# Patient Record
Sex: Male | Born: 1955 | Race: White | Hispanic: No | Smoking: Never smoker
Health system: Southern US, Community
[De-identification: ages and names within clinical notes are randomized; demographics above are authoritative.]

## PROBLEM LIST (undated history)

## (undated) DIAGNOSIS — F3160 Bipolar disorder, current episode mixed, unspecified: Secondary | ICD-10-CM

## (undated) DIAGNOSIS — G459 Transient cerebral ischemic attack, unspecified: Secondary | ICD-10-CM

## (undated) DIAGNOSIS — K219 Gastro-esophageal reflux disease without esophagitis: Secondary | ICD-10-CM

## (undated) DIAGNOSIS — Z8719 Personal history of other diseases of the digestive system: Secondary | ICD-10-CM

## (undated) DIAGNOSIS — I10 Essential (primary) hypertension: Secondary | ICD-10-CM

## (undated) DIAGNOSIS — E119 Type 2 diabetes mellitus without complications: Secondary | ICD-10-CM

## (undated) DIAGNOSIS — N400 Enlarged prostate without lower urinary tract symptoms: Secondary | ICD-10-CM

## (undated) DIAGNOSIS — E669 Obesity, unspecified: Secondary | ICD-10-CM

## (undated) DIAGNOSIS — IMO0001 Reserved for inherently not codable concepts without codable children: Secondary | ICD-10-CM

## (undated) DIAGNOSIS — R42 Dizziness and giddiness: Secondary | ICD-10-CM

## (undated) DIAGNOSIS — E785 Hyperlipidemia, unspecified: Secondary | ICD-10-CM

## (undated) DIAGNOSIS — I639 Cerebral infarction, unspecified: Secondary | ICD-10-CM

## (undated) DIAGNOSIS — K2970 Gastritis, unspecified, without bleeding: Secondary | ICD-10-CM

## (undated) HISTORY — DX: Obesity, unspecified: E66.9

## (undated) HISTORY — DX: Reserved for inherently not codable concepts without codable children: IMO0001

## (undated) HISTORY — DX: Dizziness and giddiness: R42

## (undated) HISTORY — DX: Benign prostatic hyperplasia without lower urinary tract symptoms: N40.0

## (undated) HISTORY — PX: NO PAST SURGERIES: SHX2092

## (undated) HISTORY — DX: Hyperlipidemia, unspecified: E78.5

## (undated) HISTORY — DX: Gastritis, unspecified, without bleeding: K29.70

## (undated) HISTORY — PX: COLONOSCOPY: SHX5424

## (undated) HISTORY — DX: Gastro-esophageal reflux disease without esophagitis: K21.9

## (undated) HISTORY — PX: ESOPHAGOGASTRODUODENOSCOPY: SHX1529

---

## 1998-05-08 ENCOUNTER — Ambulatory Visit (HOSPITAL_COMMUNITY): Admission: RE | Admit: 1998-05-08 | Discharge: 1998-05-08 | Payer: Self-pay | Admitting: Gastroenterology

## 1998-05-08 ENCOUNTER — Encounter: Payer: Self-pay | Admitting: Gastroenterology

## 1999-08-06 ENCOUNTER — Ambulatory Visit (HOSPITAL_COMMUNITY): Admission: RE | Admit: 1999-08-06 | Discharge: 1999-08-06 | Payer: Self-pay | Admitting: Gastroenterology

## 2001-07-06 ENCOUNTER — Ambulatory Visit (HOSPITAL_COMMUNITY): Admission: RE | Admit: 2001-07-06 | Discharge: 2001-07-06 | Payer: Self-pay | Admitting: Gastroenterology

## 2001-07-27 ENCOUNTER — Encounter: Payer: Self-pay | Admitting: Family Medicine

## 2001-07-27 ENCOUNTER — Inpatient Hospital Stay (HOSPITAL_COMMUNITY): Admission: EM | Admit: 2001-07-27 | Discharge: 2001-07-29 | Payer: Self-pay | Admitting: Family Medicine

## 2001-08-07 ENCOUNTER — Inpatient Hospital Stay (HOSPITAL_COMMUNITY): Admission: AD | Admit: 2001-08-07 | Discharge: 2001-08-10 | Payer: Self-pay | Admitting: Gastroenterology

## 2001-08-07 ENCOUNTER — Encounter (INDEPENDENT_AMBULATORY_CARE_PROVIDER_SITE_OTHER): Payer: Self-pay | Admitting: *Deleted

## 2001-08-08 ENCOUNTER — Encounter: Payer: Self-pay | Admitting: Gastroenterology

## 2001-08-09 ENCOUNTER — Encounter: Payer: Self-pay | Admitting: Gastroenterology

## 2001-09-10 ENCOUNTER — Encounter: Payer: Self-pay | Admitting: Internal Medicine

## 2001-09-10 ENCOUNTER — Inpatient Hospital Stay (HOSPITAL_COMMUNITY): Admission: EM | Admit: 2001-09-10 | Discharge: 2001-09-14 | Payer: Self-pay | Admitting: *Deleted

## 2001-11-18 ENCOUNTER — Encounter: Payer: Self-pay | Admitting: Emergency Medicine

## 2001-11-18 ENCOUNTER — Emergency Department (HOSPITAL_COMMUNITY): Admission: EM | Admit: 2001-11-18 | Discharge: 2001-11-18 | Payer: Self-pay | Admitting: Emergency Medicine

## 2001-11-19 ENCOUNTER — Encounter: Payer: Self-pay | Admitting: Emergency Medicine

## 2001-11-19 ENCOUNTER — Emergency Department (HOSPITAL_COMMUNITY): Admission: EM | Admit: 2001-11-19 | Discharge: 2001-11-19 | Payer: Self-pay | Admitting: Emergency Medicine

## 2001-12-13 ENCOUNTER — Inpatient Hospital Stay (HOSPITAL_COMMUNITY): Admission: EM | Admit: 2001-12-13 | Discharge: 2001-12-16 | Payer: Self-pay | Admitting: Emergency Medicine

## 2001-12-15 ENCOUNTER — Encounter: Payer: Self-pay | Admitting: Emergency Medicine

## 2002-02-03 ENCOUNTER — Emergency Department (HOSPITAL_COMMUNITY): Admission: EM | Admit: 2002-02-03 | Discharge: 2002-02-04 | Payer: Self-pay | Admitting: Emergency Medicine

## 2003-03-27 ENCOUNTER — Encounter: Admission: RE | Admit: 2003-03-27 | Discharge: 2003-03-27 | Payer: Self-pay | Admitting: Family Medicine

## 2007-05-04 HISTORY — PX: CARDIOVASCULAR STRESS TEST: SHX262

## 2007-05-26 ENCOUNTER — Encounter: Admission: RE | Admit: 2007-05-26 | Discharge: 2007-05-26 | Payer: Self-pay | Admitting: Family Medicine

## 2007-09-12 ENCOUNTER — Encounter: Admission: RE | Admit: 2007-09-12 | Discharge: 2007-09-12 | Payer: Self-pay | Admitting: Gastroenterology

## 2007-09-15 ENCOUNTER — Encounter: Payer: Self-pay | Admitting: Cardiology

## 2007-10-18 ENCOUNTER — Encounter: Admission: RE | Admit: 2007-10-18 | Discharge: 2007-10-18 | Payer: Self-pay | Admitting: Gastroenterology

## 2007-11-21 ENCOUNTER — Encounter: Admission: RE | Admit: 2007-11-21 | Discharge: 2007-11-21 | Payer: Self-pay | Admitting: Gastroenterology

## 2008-01-09 ENCOUNTER — Encounter: Payer: Self-pay | Admitting: Cardiology

## 2008-04-22 ENCOUNTER — Encounter: Payer: Self-pay | Admitting: Cardiology

## 2008-05-06 ENCOUNTER — Encounter: Payer: Self-pay | Admitting: Cardiology

## 2008-07-29 ENCOUNTER — Encounter: Payer: Self-pay | Admitting: Cardiology

## 2008-08-05 ENCOUNTER — Encounter: Payer: Self-pay | Admitting: Cardiology

## 2008-10-31 ENCOUNTER — Encounter: Payer: Self-pay | Admitting: Cardiology

## 2008-11-05 ENCOUNTER — Encounter: Payer: Self-pay | Admitting: Cardiology

## 2008-11-11 ENCOUNTER — Encounter: Payer: Self-pay | Admitting: Cardiology

## 2008-12-05 ENCOUNTER — Ambulatory Visit: Payer: Self-pay | Admitting: Cardiology

## 2008-12-05 DIAGNOSIS — E7800 Pure hypercholesterolemia, unspecified: Secondary | ICD-10-CM | POA: Insufficient documentation

## 2008-12-30 ENCOUNTER — Encounter: Payer: Self-pay | Admitting: Internal Medicine

## 2008-12-30 LAB — CONVERTED CEMR LAB
ALT: 43 units/L (ref 0–53)
AST: 36 units/L (ref 0–37)
Albumin: 4 g/dL (ref 3.5–5.2)
Alkaline Phosphatase: 50 units/L (ref 39–117)
Bilirubin, Direct: 0.1 mg/dL (ref 0.0–0.3)
Cholesterol: 253 mg/dL — ABNORMAL HIGH (ref 0–200)
Direct LDL: 157 mg/dL
HDL: 37.5 mg/dL — ABNORMAL LOW (ref >39.00)
Total Bilirubin: 1 mg/dL (ref 0.3–1.2)
Total CHOL/HDL Ratio: 7
Total Protein: 7.6 g/dL (ref 6.0–8.3)
Triglycerides: 407 mg/dL — ABNORMAL HIGH (ref 0.0–149.0)
VLDL: 81.4 mg/dL — ABNORMAL HIGH (ref 0.0–40.0)

## 2009-01-02 ENCOUNTER — Ambulatory Visit: Payer: Self-pay | Admitting: Cardiology

## 2009-02-10 ENCOUNTER — Encounter (INDEPENDENT_AMBULATORY_CARE_PROVIDER_SITE_OTHER): Payer: Self-pay | Admitting: *Deleted

## 2009-03-06 ENCOUNTER — Emergency Department (HOSPITAL_COMMUNITY): Admission: EM | Admit: 2009-03-06 | Discharge: 2009-03-06 | Payer: Self-pay | Admitting: Emergency Medicine

## 2009-03-10 ENCOUNTER — Emergency Department (HOSPITAL_COMMUNITY): Admission: EM | Admit: 2009-03-10 | Discharge: 2009-03-10 | Payer: Self-pay | Admitting: Emergency Medicine

## 2009-03-17 ENCOUNTER — Telehealth: Payer: Self-pay | Admitting: Internal Medicine

## 2009-03-24 ENCOUNTER — Observation Stay (HOSPITAL_COMMUNITY): Admission: EM | Admit: 2009-03-24 | Discharge: 2009-03-25 | Payer: Self-pay | Admitting: Emergency Medicine

## 2009-04-04 ENCOUNTER — Ambulatory Visit: Payer: Self-pay | Admitting: Cardiology

## 2009-04-04 LAB — CONVERTED CEMR LAB
ALT: 41 units/L (ref 0–53)
AST: 40 units/L — ABNORMAL HIGH (ref 0–37)
Albumin: 4 g/dL (ref 3.5–5.2)
Alkaline Phosphatase: 37 units/L — ABNORMAL LOW (ref 39–117)
BUN: 19 mg/dL (ref 6–23)
Bilirubin, Direct: 0 mg/dL (ref 0.0–0.3)
CO2: 29 meq/L (ref 19–32)
Calcium: 8.9 mg/dL (ref 8.4–10.5)
Chloride: 103 meq/L (ref 96–112)
Cholesterol: 255 mg/dL — ABNORMAL HIGH (ref 0–200)
Creatinine, Ser: 1.1 mg/dL (ref 0.4–1.5)
Direct LDL: 194.8 mg/dL
GFR calc non Af Amer: 73.8 mL/min (ref >60)
Glucose, Bld: 112 mg/dL — ABNORMAL HIGH (ref 70–99)
HDL: 37.8 mg/dL — ABNORMAL LOW (ref >39.00)
Potassium: 4 meq/L (ref 3.5–5.1)
Sodium: 142 meq/L (ref 135–145)
Total Bilirubin: 0.9 mg/dL (ref 0.3–1.2)
Total CHOL/HDL Ratio: 7
Total Protein: 6.9 g/dL (ref 6.0–8.3)
Triglycerides: 221 mg/dL — ABNORMAL HIGH (ref 0.0–149.0)
VLDL: 44.2 mg/dL — ABNORMAL HIGH (ref 0.0–40.0)

## 2009-04-10 ENCOUNTER — Ambulatory Visit: Payer: Self-pay | Admitting: Cardiovascular Disease

## 2009-04-14 ENCOUNTER — Telehealth (INDEPENDENT_AMBULATORY_CARE_PROVIDER_SITE_OTHER): Payer: Self-pay | Admitting: *Deleted

## 2009-04-16 ENCOUNTER — Ambulatory Visit (HOSPITAL_COMMUNITY): Admission: RE | Admit: 2009-04-16 | Discharge: 2009-04-16 | Payer: Self-pay | Admitting: Gastroenterology

## 2009-04-22 ENCOUNTER — Emergency Department (HOSPITAL_COMMUNITY): Admission: EM | Admit: 2009-04-22 | Discharge: 2009-04-22 | Payer: Self-pay | Admitting: Emergency Medicine

## 2009-05-03 ENCOUNTER — Emergency Department (HOSPITAL_COMMUNITY): Admission: EM | Admit: 2009-05-03 | Discharge: 2009-05-03 | Payer: Self-pay | Admitting: Family Medicine

## 2009-05-06 ENCOUNTER — Emergency Department (HOSPITAL_COMMUNITY): Admission: EM | Admit: 2009-05-06 | Discharge: 2009-05-06 | Payer: Self-pay | Admitting: Emergency Medicine

## 2009-05-15 ENCOUNTER — Encounter: Admission: RE | Admit: 2009-05-15 | Discharge: 2009-08-13 | Payer: Self-pay | Admitting: Family Medicine

## 2009-05-18 ENCOUNTER — Emergency Department (HOSPITAL_COMMUNITY): Admission: EM | Admit: 2009-05-18 | Discharge: 2009-05-18 | Payer: Self-pay | Admitting: Emergency Medicine

## 2009-06-26 ENCOUNTER — Ambulatory Visit: Payer: Self-pay | Admitting: Cardiology

## 2009-06-30 LAB — CONVERTED CEMR LAB
ALT: 47 units/L (ref 0–53)
AST: 48 units/L — ABNORMAL HIGH (ref 0–37)
Albumin: 4 g/dL (ref 3.5–5.2)
Alkaline Phosphatase: 49 units/L (ref 39–117)
Bilirubin, Direct: 0.1 mg/dL (ref 0.0–0.3)
Cholesterol: 299 mg/dL — ABNORMAL HIGH (ref 0–200)
Direct LDL: 211.3 mg/dL
HDL: 56.4 mg/dL (ref >39.00)
Total Bilirubin: 0.5 mg/dL (ref 0.3–1.2)
Total CHOL/HDL Ratio: 5
Total Protein: 7.1 g/dL (ref 6.0–8.3)
Triglycerides: 312 mg/dL — ABNORMAL HIGH (ref 0.0–149.0)
VLDL: 62.4 mg/dL — ABNORMAL HIGH (ref 0.0–40.0)

## 2009-07-03 ENCOUNTER — Ambulatory Visit: Payer: Self-pay | Admitting: Cardiology

## 2009-07-18 ENCOUNTER — Emergency Department (HOSPITAL_COMMUNITY): Admission: EM | Admit: 2009-07-18 | Discharge: 2009-07-18 | Payer: Self-pay | Admitting: Emergency Medicine

## 2009-08-13 ENCOUNTER — Emergency Department (HOSPITAL_COMMUNITY): Admission: EM | Admit: 2009-08-13 | Discharge: 2009-08-13 | Payer: Self-pay | Admitting: Family Medicine

## 2009-09-01 ENCOUNTER — Encounter: Payer: Self-pay | Admitting: Cardiology

## 2009-09-02 ENCOUNTER — Emergency Department (HOSPITAL_COMMUNITY): Admission: EM | Admit: 2009-09-02 | Discharge: 2009-09-02 | Payer: Self-pay | Admitting: Emergency Medicine

## 2009-10-02 ENCOUNTER — Ambulatory Visit: Payer: Self-pay | Admitting: Internal Medicine

## 2009-10-02 ENCOUNTER — Telehealth (INDEPENDENT_AMBULATORY_CARE_PROVIDER_SITE_OTHER): Payer: Self-pay | Admitting: *Deleted

## 2009-10-03 ENCOUNTER — Telehealth (INDEPENDENT_AMBULATORY_CARE_PROVIDER_SITE_OTHER): Payer: Self-pay | Admitting: *Deleted

## 2009-10-10 ENCOUNTER — Telehealth (INDEPENDENT_AMBULATORY_CARE_PROVIDER_SITE_OTHER): Payer: Self-pay | Admitting: *Deleted

## 2009-10-13 LAB — CONVERTED CEMR LAB
Cholesterol: 295 mg/dL — ABNORMAL HIGH (ref 0–200)
Direct LDL: 200.4 mg/dL
HDL: 49 mg/dL (ref >39.00)
Total CHOL/HDL Ratio: 6
Triglycerides: 360 mg/dL — ABNORMAL HIGH (ref 0.0–149.0)
VLDL: 72 mg/dL — ABNORMAL HIGH (ref 0.0–40.0)

## 2009-12-08 ENCOUNTER — Ambulatory Visit: Payer: Self-pay | Admitting: Cardiology

## 2010-01-06 ENCOUNTER — Ambulatory Visit: Payer: Self-pay | Admitting: Cardiovascular Disease

## 2010-01-09 LAB — CONVERTED CEMR LAB
ALT: 36 units/L (ref 0–53)
AST: 43 units/L — ABNORMAL HIGH (ref 0–37)
Albumin: 4 g/dL (ref 3.5–5.2)
Alkaline Phosphatase: 46 units/L (ref 39–117)
Bilirubin, Direct: 0.1 mg/dL (ref 0.0–0.3)
Cholesterol: 267 mg/dL — ABNORMAL HIGH (ref 0–200)
Direct LDL: 161 mg/dL
HDL: 41.1 mg/dL (ref >39.00)
Total Bilirubin: 0.4 mg/dL (ref 0.3–1.2)
Total CHOL/HDL Ratio: 6
Total Protein: 6.6 g/dL (ref 6.0–8.3)
Triglycerides: 513 mg/dL — ABNORMAL HIGH (ref 0.0–149.0)
VLDL: 102.6 mg/dL — ABNORMAL HIGH (ref 0.0–40.0)

## 2010-01-15 ENCOUNTER — Ambulatory Visit: Payer: Self-pay | Admitting: Cardiology

## 2010-03-03 ENCOUNTER — Encounter: Payer: Self-pay | Admitting: Internal Medicine

## 2010-03-27 ENCOUNTER — Ambulatory Visit: Payer: Self-pay | Admitting: Cardiovascular Disease

## 2010-04-02 ENCOUNTER — Telehealth (INDEPENDENT_AMBULATORY_CARE_PROVIDER_SITE_OTHER): Payer: Self-pay | Admitting: *Deleted

## 2010-04-02 DIAGNOSIS — IMO0001 Reserved for inherently not codable concepts without codable children: Secondary | ICD-10-CM

## 2010-04-02 HISTORY — DX: Reserved for inherently not codable concepts without codable children: IMO0001

## 2010-04-06 ENCOUNTER — Ambulatory Visit: Payer: Self-pay | Admitting: Cardiovascular Disease

## 2010-04-06 ENCOUNTER — Ambulatory Visit: Payer: Self-pay

## 2010-04-06 ENCOUNTER — Encounter (HOSPITAL_COMMUNITY)
Admission: RE | Admit: 2010-04-06 | Discharge: 2010-06-02 | Payer: Self-pay | Source: Home / Self Care | Attending: Cardiology | Admitting: Cardiology

## 2010-04-06 ENCOUNTER — Encounter: Payer: Self-pay | Admitting: Cardiovascular Disease

## 2010-04-16 ENCOUNTER — Ambulatory Visit: Payer: Self-pay | Admitting: Internal Medicine

## 2010-05-31 LAB — CONVERTED CEMR LAB
ALT: 36 units/L (ref 0–53)
AST: 43 units/L — ABNORMAL HIGH (ref 0–37)
Albumin: 3.8 g/dL (ref 3.5–5.2)
Alkaline Phosphatase: 46 units/L (ref 39–117)
Bilirubin, Direct: 0.1 mg/dL (ref 0.0–0.3)
Cholesterol: 247 mg/dL — ABNORMAL HIGH (ref 0–200)
Direct LDL: 155.8 mg/dL
HDL: 41.4 mg/dL (ref >39.00)
Total Bilirubin: 0.6 mg/dL (ref 0.3–1.2)
Total CHOL/HDL Ratio: 6
Total Protein: 6.5 g/dL (ref 6.0–8.3)
Triglycerides: 336 mg/dL — ABNORMAL HIGH (ref 0.0–149.0)
VLDL: 67.2 mg/dL — ABNORMAL HIGH (ref 0.0–40.0)

## 2010-06-04 NOTE — Assessment & Plan Note (Signed)
Summary: ROV,lipid/eac   Robert Walters presents to lipid clinic today for dyslipidemia follow-up.  His current cholesterol medication regimen includes simvastatin 80 mg daily, fenofibrate 160 mg daily, and fish oil 1000 mg capsules - 3 capsules twice daily.  Pts compliance has much improved since last visit and he reports missing doses only rarely.  Pt is otherwise tolerating this regimen and does not complain of adverse events.  Patient is currently on simvastatin but has been prescribed Lipitor 80 mg daily by his PCP.  Lipitor is coming via mail order and patient will switch to this when it arrives.  He was also pleased to report to me that his stress test was clear and revealed no blockages and good oxygen supply to the heart.   Compliance with dietary and lifesyle modifications has improved. Patient has decreased eating eggs to only twice per week.  Patient has not been willing to change from whoel to 2% milk, but says he only drinks milk once per week.  Patient has attempted to improve his lunch choices.  He no longer eats candy bars for lunch, but does continue to have a diet soda and a small bag of chips or fritos.  Patient will continue to work on this.  He is also attempting to limit starchy foods and make better choices.  These are small but significant changes for Robert Walters considering his resistance in the past.    Patient has also improved his exercise regimen and is now walking at the park every day for at least 20 minutes.  He makes 2 laps around the park (4 laps = 1 mile) at a slow-mod pace.  Patient intends to increase to 4 laps per day.     Lipid Management Robert Walters  Robert Walters, PharmD and Robert Walters  Preventive Screening-Counseling & Management  Alcohol-Tobacco     Alcohol drinks/day: 0     Smoking Status: never  Current Medications (verified): 1)  Fenofibrate 160 Mg Tabs (Fenofibrate) .... Take One Tablet By Mouth Daily With A Meal 2)  Atenolol 100 Mg Tabs (Atenolol)  .... Take One Tablet By Mouth Two Times A Day 3)  Fish Oil 1200 Mg Caps (Omega-3 Fatty Acids) .... Take On Cap 3 Cap Bid 4)  Pantoprazole Sodium 40 Mg Tbec (Pantoprazole Sodium) .[name].. 1 Tab Once Daily 5)  Metformin Hcl 1000 Mg Tabs (Metformin Hcl) .... Take One Tab Two Times A Day With Meals 6)  Glipizide 10 Mg Tabs (Glipizide) .... Take One Tab Two Times A Day With Meals 7)  Epitol 200 Mg Tabs (Carbamazepine) .... Take One Tab Two Times A Day 8)  Centrum  Tabs (Multiple Vitamins-Minerals) .... Take On Tab Once Daily 9)  Aspirin 81 Mg Tbec (Aspirin) .... Take One Tab Once Daily 10)  Vitamin D 400 Unit Caps (Cholecalciferol) .... Take On Tab Two Times A Day 11)  Cinnamon 500 Mg Caps (Cinnamon) .... Take Two Tabs Once Daily 12)  Lamotrigine 25 Mg Tabs (Lamotrigine) .[name].. 1 Tab At Bedtime 13)  Meclizine Hcl 25 Mg Tabs (Meclizine Hcl) .[name].. 1 Tab Three Times A Day 14)  Losartan Potassium 100 Mg Tabs (Losartan Potassium) .[name].. 1 Tablet Daily. 15)  Oxybutynin Chloride 5 Mg Xr24h-Tab (Oxybutynin Chloride) .[name].. 1 Tablet Daily. 16)  Tamsulosin Hcl 0.4 Mg Caps (Tamsulosin Hcl) .[name].. 1 Tablet Daily. 17)  Finasteride 5 Mg Tabs (Finasteride) .[name].. 1 Tablet Daily 18)  Buspirone Hcl 10 Mg Tabs (Buspirone Hcl) .[name].. 1 Tablet Twice Daily 19)  Lipitor 80  Mg Tabs (Atorvastatin Calcium) .[name].. 1 Tablet Daily.  Allergies (verified): No Known Drug Allergies   Vital Signs:  Patient profile:   55 year old male Weight:      260 pounds Pulse rate:   76 / minute BP sitting:   140 / 96  Impression & Recommendations:  Problem # 1:  HYPERCHOLESTEROLEMIA-PURE (ICD-272.0) Assessment Improved [name] is much improved since last follow-up.  Lipid values are as follows:  TC 247 (recently 267), TG 336 (recently 513), HDL 41, LDL 155 (recently 425).  TC/HDL ratio remains 6.  Patient has made several small but significant dietary changes with which I am pleased.  He has also began incorporating more exercise and seems  motivated to continue.   Lipitor has been prescribed for this patient and he will begin this once it arrives via mail order. I am pleased with [name] progress at this visit and hope he will remain motivated.    Plan:  Stop Simvastatin and begin Lipitor 80 mg Continue making healthy dietary choices.  (I have given pt a booklet of fast food listings and nutrition labels) Continue exercising as much as possible.  Return to clinic in 3 months for follow-up on new regimen.  The following medications were removed from the medication list:    Simvastatin 80 Mg Tabs (Simvastatin) .[name]... Take one tablet by mouth daily at bedtime His updated medication list for this problem includes:    Fenofibrate 160 Mg Tabs (Fenofibrate) .[name]... Take one tablet by mouth daily with a meal    Lipitor 80 Mg Tabs (Atorvastatin calcium) .[name]... 1 tablet daily.  Patient Instructions: 1)  1)  Continue to improve your dietary choices by comparing food labels for various restaurants.  Watch fat content and carbohydrates!!  You've already made many good changes! 2)  2)  Continue exercising and work up to 4 laps per day. 3)  3)  Begin Lipitor (atorvastatin) as soon as it comes in the mail.  4)  4)  Stop simvastatin when you start Lipitor.   5)  5)  Return to clinic in 3 months. 6)  Lipid Clinic Appt:  Thurs, March 8th @ 10:30 am 7)  Labwork:  Mon, March 5th @ 8:45 am (Fasting)

## 2010-06-04 NOTE — Progress Notes (Signed)
Summary: Lipid Question  Phone Note Call from Patient Call back at Home Phone [phone]   Reason for Call: Talk to Nurse Summary of Call: returning call Initial call taken by: Migdalia Dk,  October 03, 2009 1:26 PM  Follow-up for Phone Call        Pt calling asking about lab results from yesterday.  Informed pt that lipids had worsened slightly since May.  He was instructed to continue with the plan set with Elaina Pattee, PharmD during yesterday's office visit.  Pt agreed to plan Follow-up by: Weston Brass PharmD,  October 03, 2009 3:53 PM

## 2010-06-04 NOTE — Assessment & Plan Note (Signed)
Summary: lipid rov/eac   Primary Provider:  Ursula Beath   History of Present Illness: Mr. Creppel presents today for FU to Lipid Clinic.  He does not have intolerances to cholesterol medications, however he is on simvastatin due to cost.  He also takes fenofibrate and high-dose fish oil.  Patient states he has been compliant with meds since last visit.    He has been given extensive counseling on making appropriate diet changes, and he has made minimal progress over the last 10 months.  He continues to eat [employer] steak biscuit or egg/bacon, sausage, or ham/hashbrown for breakfast.  He states he has cut out the [employer] except "once in a while".  He usually eats PB crackers or chips for lunch.  He will eat cereal with whole milk, ham/cheese sandwich on whole wheat, or pork and beans for supper.  He does not eat fruits and vegetables on a regular basis.  He has cut his popcorn down to 1x week.  He drinks water, diet drinks, and tea (half sweet, half unsweet).  His exercise regimen has improved since last year.  He is walking 2-3x around [employer] 2x week.  Encouraged because his weight is down 4 lbs since March.  He complains of "tickling" cough in AM and evening.  Advised to try loratadine x 1week, and if this did not improve to discuss with PCP (possible AE of lisinopril but unlikely at this time).  Mr. Burnsworth presents to lipid clinic today for dyslipidemia follow-up.  He reports semi-compliance with his medication regimen which includes simvastatin 80 mg daily, fenofibrate 160 mg daily, and fish oil 1000 mg capsules - 3 capsules twice daily.  Pt is tolerating this regimen and does not complain of adverse events.  Recently, this patient has experienced episodes of vertigo, requiring hospitalization and rehabilitation.  He says, this has caused him to be non-compliant for a short time due to severe N/V.  He does not smoke or drink.    Dietary compliance is poor and patient  disregards any previous education we have provided regarding healthy dietary choices.  This patient is a fan of fast food and enjoys a full course breakfast from [employer] every morning (biscuit, eggs, bacon, hashbrown, and coffee).  For lunch, he usually has a sandwich on whole wheat, unsweet tea/diet drink, and maybe cookies for dessert.  For supper pt reports eating out alot, but limiting his fast food to once weekly.  When he is not eating fast food, he enjoys BBQ with all the fixins, he also loves pinto beans and hates chicken and fish.  He loves steak, but limits it to 3x/mo.  He also asked if "fatback" was ok for him to eat...  Between meals the patient enjoys snacking.  He has a full size bag of extra butter popcorn about 3 times per week, and also frequently has a bowl of cereal for a nightly snack.  Beverages include unsweet tea, diet drinks, and pt only drinks whole milk.    Exercise is not routine and includes walking around [employer] about 3x/wk for about 30 minutes.   Lipid Management Provider  Eda Keys, PharmD  Preventive Screening-Counseling & Management  Alcohol-Tobacco     Alcohol drinks/day: 0     Smoking Status: never  Caffeine-Diet-Exercise     Does Patient Exercise: yes     Type of exercise: Walking     Exercise (avg: min/session): <30     Times/week: <3     Exercise Counseling: to  improve exercise regimen  Allergies: No Known Drug Allergies  Social History: Does Patient Exercise:  yes   Vital Signs:  Patient profile:   55 year old male Height:      69 inches Weight:      254 pounds BMI:     37.64  Impression & Recommendations:  Problem # 1:  HYPERCHOLESTEROLEMIA-PURE (ICD-272.0) Assessment Improved Dyslipidemia has minimally improved.  Lipid values are listed and goals are per ATP III/NCEP guidelines as follows:  TC 220 (Goal <200), TG 305 (Goal <150), HDL 43 (Goal >40), Direct LDL   (Goal <70).  LFTs are WNL AST 38/ALT 53.  Continue current meds.   Diet/exercise noncompliance continues.  Counseled at length about risks and need to implement these lifestyle changes.  Fast food limited to 3x weekly, change to skim milk and fat free cheese, and increase fruits/veggies to 1 each daily.  Choose healthier alternatives overall, which include eating plain grits/fruit, yogurt, cereal, pancakes with fruit, and salads.  Increase exercise to 5x week.  Consider changing to atorvastatin when generic or using new patient assistance program if not improved.  FU in 3 months and obtain fasting lipid/liver panel prior to visit. His updated medication list for this problem includes:    Fenofibrate 160 Mg Tabs (Fenofibrate) .[name]... Take one tablet by mouth daily with a meal    Simvastatin 80 Mg Tabs (Simvastatin) .[name]... Take one tablet by mouth daily at bedtime    Fish Oil 1000 Mg Caps .[name]... Take 3 capsules by mouth twice daily  Other Orders: TLB-Lipid Panel (80061-[id number])  Patient Instructions: 1)  Continue weight loss!  Good job with your efforts so far. 2)  Continue Simvastatin 80 mg at bedtime, Fenofibrate 160 mg daily, and Fish Oil 3 capsules twice daily. 3)  Decrease fast food for breakfast to 3 times per week.  Other days eat yogurt, cereal, pancakes with fruit or jelly and no syrup.  Change to skim milk and fat free cheese.  No fried foods. 4)  Increase fruits and vegetables to at least 1 of each per day. 5)  Continue to cut down on snacks. Popcorn or chips once per week. 6)  Increase walking to 5 times per week. 7)  Next appointment 01/08/10 at 10:30 with Lipid Clinic.  Fasting labs on 01/06/10 at 9:00.  Appended Document: lipid rov/eac Repeat Lipid Panel 10/02/09 to obtain direct LDL since pt was fasting.  TC 295, TG 360, HDL 49, direct LDL 200.  Called pt to inform.  Elaina Pattee, PharmD 10/03/09.

## 2010-06-04 NOTE — Progress Notes (Signed)
  pt came in today Signed ROI,I gave him Copies of his Labs  [facility]  October 02, 2009 11:51 AM

## 2010-06-04 NOTE — Assessment & Plan Note (Signed)
Summary: rov/cb   CC:  dyslipidemia follow-up.  [name] presents to lipid clinic today for dyslipidemia follow-up.  His current cholesterol medication regimen includes simvastatin 80 mg daily, fenofibrate 160 mg daily, and fish oil 1000 mg capsules - 3 capsules twice daily.  Pt is semi-compliant with this regimen and reports missing on average 2 doses per week due to N/V and also admits to taking medications at irregular times of the day. Pt is otherwise tolerating this regimen and does not complain of adverse events. Pt was reminded of increased risk of myalgias with high dose of simvastatin but denies any muscle pain or weakness. He does not smoke or drink.    Compliance with previous dietary and lifesyle recommendations is poor and patient is unwilling to recognize the importance of a healthy diet. A typical breakfast for this patient consists of several scrambled eggs, several pieces of bacon or sausage, a biscuit with gravy, hashbrowns, and coffee usually from [employer] or [employer]. Pt eats fast food frequently and rarely cooks breakfast at home. For lunch, he usually has a diet soda and a candybar and will have an Icee on occasion for snack.  For supper, pt reports often eating corn on the cobb with butter, pinto beans, peas, a ham sandwich with light mayonnaise, and 1-2 glasses of whole milk. Mr. Oyama refuses to switch to 2% milk and hates chicken and fish.  Pt also admits to eating large baked potatoes regularly. He loves steak, but limits intake to 2-3x/month.  He often has a bowl of popcorn with lite butter at night before bedtime.  Beverages include sweetened  tea, diet drinks, whole milk, and bottled water.   Exercise is not routine and includes light walking around [employer] about 2-3x/wk for about 20 minutes.   Lipid Management Provider  Eda Keys, PharmD and Harrel Carina  Preventive Screening-Counseling & Management  Alcohol-Tobacco     Alcohol drinks/day: 0     Smoking  Status: never  Current Medications (verified): 1)  Fenofibrate 160 Mg Tabs (Fenofibrate) .... Take One Tablet By Mouth Daily With A Meal 2)  Atenolol 100 Mg Tabs (Atenolol) .... Take One Tablet By Mouth Two Times A Day 3)  Simvastatin 80 Mg Tabs (Simvastatin) .... Take One Tablet By Mouth Daily At Bedtime 4)  Fish Oil 1200 Mg Caps (Omega-3 Fatty Acids) .... Take On Cap 3 Cap Bid 5)  Pantoprazole Sodium 40 Mg Tbec (Pantoprazole Sodium) .[name].. 1 Tab Once Daily 6)  Metformin Hcl 1000 Mg Tabs (Metformin Hcl) .... Take One Tab Two Times A Day With Meals 7)  Glipizide 10 Mg Tabs (Glipizide) .... Take One Tab Two Times A Day With Meals 8)  Epitol 200 Mg Tabs (Carbamazepine) .... Take One Tab Two Times A Day 9)  Alprazolam 0.5 Mg Tabs (Alprazolam) .... Take One Tab As Needed 10)  Centrum  Tabs (Multiple Vitamins-Minerals) .... Take On Tab Once Daily 11)  Aspirin 81 Mg Tbec (Aspirin) .... Take One Tab Once Daily 12)  Vitamin D 400 Unit Caps (Cholecalciferol) .... Take On Tab Two Times A Day 13)  Cinnamon 500 Mg Caps (Cinnamon) .... Take Two Tabs Once Daily 14)  Lamotrigine 25 Mg Tabs (Lamotrigine) .[name].. 1 Tab At Bedtime 15)  Meclizine Hcl 25 Mg Tabs (Meclizine Hcl) .[name].. 1 Tab Three Times A Day 16)  Losartan .[name].. 1 Tablet Daily  Allergies: No Known Drug Allergies  Past History:  Past Medical History: Last updated: 12/05/2008 Diabetes Type 2 G E R D  Hyperlipidemia  Family History: Last updated: 12/05/2008 Father: alive with DM diagnosed 42yr ago and colon Ca 23yr ago Mother: CVA no A-fib  - 7 yr ago  Risk Factors: Alcohol Use: 0 (01/15/2010) Exercise: yes (10/02/2009)   Vital Signs:  Patient profile:   55 year old male Weight:      258 pounds Pulse rate:   78 / minute BP sitting:   132 / 84  Impression & Recommendations:  Problem # 1:  HYPERCHOLESTEROLEMIA-PURE (ICD-272.0) Assessment Deteriorated Mr. Som's hypercholesterolemia is deteriorating with TG significantly elevated  since last visit and decreased HDL, likely due to poor compliance with medications, diet, exercise, and inadequate control of diabetes. However, despite poor compliance with diet and exercise, Pt's TC and LDL have improved slightly but TC, LDL, and TG are still not at goal. Lipid values with goals per NCEP/ATPIII guidelines are as follows: TC 267 (goal<200), HDL 41 (goal >40), LDL 161 (goal<100), TG 513 (goal<150). LFTs are WNL. Diet and exercise compliance is consistently poor and patient remains reluctant to make many changes. Patient was counseled extensively on the importance of making changes in order to live a healthier lifestyle. Discussed the strategy of beginning with small changes and progressing form there. Patient was counseled on limiting serving size of foods high in carbohydrates and fat. The following changes were recommended:  Reduce intake of eggs to 2-3x/week.  Reduce intake of fatty breakfast meats to 2-3x/week.  Instead of a candy bar and soda for lunch, choose a sandwich on whole wheat bread and water.  Mix 1/2 cup of whole milk with 1/2 cup of 2% milk instead of drinking a full glass of whole milk.  Limit serving sizes of beans, corn, and baked potatoes and try to only have 1 of these with each meal instead of all of them.  Increase walking around [employer] to 4-5x/week.   Continue current medications. Obtain lipid panel and re-evaluate compliance with diet, exercise, and medications in 3 months. Consider referring to dietary consult if not improved in 3 months. May consider switching to more potent statin at next visit when Lipitor becomes generic.  His updated medication list for this problem includes:    Fenofibrate 160 Mg Tabs (Fenofibrate) .[name]... Take one tablet by mouth daily with a meal    Simvastatin 80 Mg Tabs (Simvastatin) .[name]... Take one tablet by mouth daily at bedtime    Fish oil 1000 mg capsules.Marland KitchenMarland KitchenMarland KitchenTake 3 capsules by mouth twice daily

## 2010-06-04 NOTE — Progress Notes (Signed)
Summary: PT WANT COPY OF LIPID RESULTS FROM 09/30/09  Phone Note Call from Patient Call back at Home Phone [phone]   Caller: Patient Summary of Call: Pt want copy of his lab work Initial call taken by: Judie Grieve,  October 10, 2009 11:34 AM  Follow-up for Phone Call        Spoke with pt.  Will mail a copy of most recent lipid panel to his home address on file.  Follow-up by: Weston Brass PharmD,  October 10, 2009 2:46 PM

## 2010-06-04 NOTE — Assessment & Plan Note (Signed)
Summary: Cardiology Nuclear Testing  Nuclear Med Background Indications for Stress Test: Evaluation for Ischemia, Abnormal EKG   History: Myocardial Perfusion Study  History Comments: '09 [id number]  Symptoms: DOE, Fatigue    Nuclear Pre-Procedure Cardiac Risk Factors: Hypertension, Lipids, NIDDM, Obesity Caffeine/Decaff Intake: none NPO After: 11:00 PM Lungs: Clear.  O2 Sat 96% on RA. IV 0.9% NS with Angio Cath: 22g     IV Site: R Forearm IV Started by: Cathlyn Parsons, RN Chest Size (in) 44     Height (in): 69 Weight (lb): 256 BMI: 37.94 Tech Comments: Atenolol held x 12 hours.  Nuclear Med Study 1 or 2 day study:  1 day     Stress Test Type:  Treadmill/Lexiscan Reading MD:  Charlton Haws, MD     Referring MD:  Roger Shelter, MD Resting Radionuclide:  Technetium 58m Tetrofosmin     Resting Radionuclide Dose:  10.8 mCi  Stress Radionuclide:  Technetium 27m Tetrofosmin     Stress Radionuclide Dose:  33 mCi   Stress Protocol Exercise Time (min):  1:24 min     Max HR:  109 bpm     Predicted Max HR:  164 bpm  Max Systolic BP: 175 mm Hg     Percent Max HR:  66.46 %Rate Pressure Product:  02725  Lexiscan: 0.4 mg   Stress Test Technologist:  Rea College, CMA-N     Nuclear Technologist:  Domenic Polite, CNMT  Rest Procedure  Myocardial perfusion imaging was performed at rest 45 minutes following the intravenous administration of Technetium 62m Tetrofosmin.  Stress Procedure  The patient received IV Lexiscan 0.4 mg over 15-seconds with concurrent low level exercise and then Technetium 22m Tetrofosmin was injected at 30-seconds.  There were no significant EKG changes with infusion, but the patient's O2 Sat dropped with low level exercise and infusion from 96% to 83%.   Quantitative spect images were obtained after a 45 minute delay.  QPS Raw Data Images:  Normal; no motion artifact; normal heart/lung ratio. Stress Images:  Normal homogeneous uptake in all areas of the  myocardium. Rest Images:  Normal homogeneous uptake in all areas of the myocardium. Subtraction (SDS):  Normal Transient Ischemic Dilatation:  0.88  (Normal <1.22)  Lung/Heart Ratio:  0.43  (Normal <0.45)  Quantitative Gated Spect Images QGS EDV:  109 ml QGS ESV:  32 ml QGS EF:  70 % QGS cine images:  normal  Findings Normal nuclear study      Overall Impression  Exercise Capacity: Lexiscan with low level exercise BP Response: Normal blood pressure response. Clinical Symptoms: Dizzy ECG Impression: No significant ST segment change suggestive of ischemia. Overall Impression: Normal stress nuclear study.  Appended Document: Cardiology Nuclear Testing copy sent to DR. Deborah Chalk

## 2010-06-04 NOTE — Progress Notes (Signed)
Summary: Nuc Pre-Procedure  Phone Note Outgoing Call Call back at [facility] Phone [phone]   Call placed by: Antionette Char RN,  April 02, 2010 4:10 PM Call placed to: Patient Reason for Call: Confirm/change Appt Summary of Call: Reviewed information on Myoview Information Sheet (see scanned document for further details).  Spoke with patient.     Nuclear Med Background Indications for Stress Test: Evaluation for Ischemia, Abnormal EKG   History: Myocardial Perfusion Study  History Comments: 2009 MPS Nml     Nuclear Pre-Procedure Cardiac Risk Factors: Hypertension, Lipids Height (in): 69

## 2010-06-04 NOTE — Assessment & Plan Note (Signed)
Summary: rov - lipid - [facility]   Primary Provider:  Ursula Beath  CC:  dyslipidemia follow-up.  History of Present Illness:  Lipid Clinic Visit      The patient comes in today for dyslipidemia follow-up.  The patient denies medication problems.  Dietary compliance review reveals an overall grade of F, not eating 5 or more fruits and vegetables, and not limiting fats and TFA's.  Review of exercise habits reveals that the patient is not engaging in routine exercise but does report walking around [employer] 3 times per week.  Adjunctive measures being instituted include omega-3 supplements.    Robert Walters presents to lipid clinic today for dyslipidemia follow-up.  He reports semi-compliance with his medication regimen which includes simvastatin 80 mg daily, fenofibrate 160 mg daily, and fish oil 1000 mg capsules - 3 capsules twice daily.  Pt is tolerating this regimen and does not complain of adverse events.  Recently, this patient has experienced episodes of vertigo, requiring hospitalization and rehabilitation.  He says, this has caused him to be non-compliant for a short time due to severe N/V.  He does not smoke or drink.    Dietary compliance is poor and patient disregards any previous education we have provided regarding healthy dietary choices.  This patient is a fan of fast food and enjoys a full course breakfast from [employer] every morning (biscuit, eggs, bacon, hashbrown, and coffee).  For lunch, he usually has a sandwich on whole wheat, unsweet tea/diet drink, and maybe cookies for dessert.  For supper pt reports eating out alot, but limiting his fast food to once weekly.  When he is not eating fast food, he enjoys BBQ with all the fixins, he also loves pinto beans and hates chicken and fish.  He loves steak, but limits it to 3x/mo.  He also asked if "fatback" was ok for him to eat...  Between meals the patient enjoys snacking.  He has a full size bag of extra butter popcorn about 3 times per  week, and also frequently has a bowl of cereal for a nightly snack.  Beverages include unsweet tea, diet drinks, and pt only drinks whole milk.    Exercise is not routine and includes walking around [employer] about 3x/wk for about 30 minutes.   Lipid Management Provider  Eda Keys, PharmD  Preventive Screening-Counseling & Management  Alcohol-Tobacco     Alcohol drinks/day: 0     Smoking Status: never  Allergies: No Known Drug Allergies  Social History: Smoking Status:  never   Vital Signs:  Patient profile:   55 year old male Weight:      258 pounds BP sitting:   138 / 82  Impression & Recommendations:  Problem # 1:  HYPERCHOLESTEROLEMIA-PURE (ICD-272.0) Assessment Deteriorated Lipid values are as follows:  TC 299 (previously 255, goal <200), TG 312 (previously 221, goal <150), HDL 56 (previously 37, goal >40), LDL 211 (previously 194, goal <70).  LFTs within normal range with AST only slightly elevated.  Patient's LDL and TG have deteriorated since last visit with HDL being the only value at goal.  Patient is already taking high dose statin, as well as fenofibrate plus high dose fish oil.  His worsening lipid levels can likely be contributed to his very poor and noncompliant diet.  We will target lifestyle changes in this patient before changing therapy.  Although, in converstation with this patient it is highly unlikely that he will comply with dietary changes.   Plan:  Continue current  medications.  Improve dietary choices, limiting fast food breakfast to once weekly, limiting snacks especially popcorn to once weekly, and when selecting popcorn choosing the "smart pop", low butter/salt.  I have also encouraged patient to choose lowfat dairy products, increase servings of veg/fruit in diet as well as fiber.  Also, have encouraged patient to walk more, as much as he can.  Follow-up in 3 months.    His updated medication list for this problem includes:    Fenofibrate 160 Mg  Tabs (Fenofibrate) .[name]... Take one tablet by mouth daily with a meal    Simvastatin 80 Mg Tabs (Simvastatin) .[name]... Take one tablet by mouth daily at bedtime

## 2010-06-25 ENCOUNTER — Emergency Department (HOSPITAL_COMMUNITY)
Admission: EM | Admit: 2010-06-25 | Discharge: 2010-06-25 | Disposition: A | Discharge status: Home / Self Care | Payer: Medicare Other | Attending: Emergency Medicine | Admitting: Emergency Medicine

## 2010-06-25 ENCOUNTER — Emergency Department (HOSPITAL_COMMUNITY): Payer: Medicare Other

## 2010-06-25 ENCOUNTER — Encounter (HOSPITAL_COMMUNITY): Payer: Self-pay | Admitting: Radiology

## 2010-06-25 DIAGNOSIS — R109 Unspecified abdominal pain: Secondary | ICD-10-CM | POA: Insufficient documentation

## 2010-06-25 DIAGNOSIS — I10 Essential (primary) hypertension: Secondary | ICD-10-CM | POA: Insufficient documentation

## 2010-06-25 DIAGNOSIS — Z7982 Long term (current) use of aspirin: Secondary | ICD-10-CM | POA: Insufficient documentation

## 2010-06-25 DIAGNOSIS — R110 Nausea: Secondary | ICD-10-CM | POA: Insufficient documentation

## 2010-06-25 DIAGNOSIS — E785 Hyperlipidemia, unspecified: Secondary | ICD-10-CM | POA: Insufficient documentation

## 2010-06-25 DIAGNOSIS — E119 Type 2 diabetes mellitus without complications: Secondary | ICD-10-CM | POA: Insufficient documentation

## 2010-06-25 DIAGNOSIS — Z79899 Other long term (current) drug therapy: Secondary | ICD-10-CM | POA: Insufficient documentation

## 2010-06-25 HISTORY — DX: Essential (primary) hypertension: I10

## 2010-06-25 LAB — URINALYSIS, ROUTINE W REFLEX MICROSCOPIC
Hgb urine dipstick: NEGATIVE
Ketones, ur: NEGATIVE mg/dL
Protein, ur: NEGATIVE mg/dL
Urine Glucose, Fasting: NEGATIVE mg/dL
Urobilinogen, UA: 0.2 mg/dL (ref 0.0–1.0)

## 2010-06-25 LAB — COMPREHENSIVE METABOLIC PANEL
ALT: 39 U/L (ref 0–53)
AST: 43 U/L — ABNORMAL HIGH (ref 0–37)
Albumin: 4.1 g/dL (ref 3.5–5.2)
CO2: 25 mEq/L (ref 19–32)
Chloride: 104 mEq/L (ref 96–112)
GFR calc Af Amer: >60 mL/min (ref >60)
GFR calc non Af Amer: 54 mL/min — ABNORMAL LOW (ref >60)
Potassium: 4.3 mEq/L (ref 3.5–5.1)
Sodium: 139 mEq/L (ref 135–145)
Total Bilirubin: 0.7 mg/dL (ref 0.3–1.2)

## 2010-06-25 LAB — DIFFERENTIAL
Basophils Absolute: 0 10*3/uL (ref 0.0–0.1)
Basophils Relative: 0 % (ref 0–1)
Eosinophils Relative: 3 % (ref 0–5)
Monocytes Absolute: 0.7 10*3/uL (ref 0.1–1.0)
Monocytes Relative: 8 % (ref 3–12)
Neutro Abs: 6.9 10*3/uL (ref 1.7–7.7)

## 2010-06-25 LAB — LIPASE, BLOOD: Lipase: 39 U/L (ref 11–59)

## 2010-06-25 LAB — CBC
HCT: 37.6 % — ABNORMAL LOW (ref 39.0–52.0)
Hemoglobin: 12.7 g/dL — ABNORMAL LOW (ref 13.0–17.0)
MCH: 31 pg (ref 26.0–34.0)
MCHC: 33.8 g/dL (ref 30.0–36.0)
RDW: 13.9 % (ref 11.5–15.5)

## 2010-06-25 MED ORDER — IOHEXOL 300 MG/ML  SOLN
100.0000 mL | Freq: Once | INTRAMUSCULAR | Status: AC | PRN
  Administered 2010-06-25: 100 mL via INTRAVENOUS

## 2010-07-06 ENCOUNTER — Other Ambulatory Visit: Payer: Self-pay

## 2010-07-06 ENCOUNTER — Encounter (INDEPENDENT_AMBULATORY_CARE_PROVIDER_SITE_OTHER): Payer: Self-pay | Admitting: Pharmacist

## 2010-07-06 ENCOUNTER — Other Ambulatory Visit (INDEPENDENT_AMBULATORY_CARE_PROVIDER_SITE_OTHER): Payer: Medicare Other

## 2010-07-06 DIAGNOSIS — E7800 Pure hypercholesterolemia, unspecified: Secondary | ICD-10-CM

## 2010-07-06 DIAGNOSIS — E785 Hyperlipidemia, unspecified: Secondary | ICD-10-CM

## 2010-07-06 LAB — LIPID PANEL
Total CHOL/HDL Ratio: 5
VLDL: 61.8 mg/dL — ABNORMAL HIGH (ref 0.0–40.0)

## 2010-07-06 LAB — HEPATIC FUNCTION PANEL
AST: 43 U/L — ABNORMAL HIGH (ref 0–37)
Alkaline Phosphatase: 54 U/L (ref 39–117)
Bilirubin, Direct: 0.2 mg/dL (ref 0.0–0.3)

## 2010-07-09 ENCOUNTER — Encounter: Payer: Self-pay | Admitting: Internal Medicine

## 2010-07-09 ENCOUNTER — Ambulatory Visit (INDEPENDENT_AMBULATORY_CARE_PROVIDER_SITE_OTHER): Payer: Medicare Other

## 2010-07-09 DIAGNOSIS — Z79899 Other long term (current) drug therapy: Secondary | ICD-10-CM

## 2010-07-09 DIAGNOSIS — E7800 Pure hypercholesterolemia, unspecified: Secondary | ICD-10-CM

## 2010-07-19 LAB — URINE CULTURE
Colony Count: NO GROWTH
Culture: NO GROWTH
Culture: NO GROWTH

## 2010-07-19 LAB — COMPREHENSIVE METABOLIC PANEL
ALT: 41 U/L (ref 0–53)
AST: 46 U/L — ABNORMAL HIGH (ref 0–37)
Alkaline Phosphatase: 50 U/L (ref 39–117)
CO2: 25 mEq/L (ref 19–32)
Calcium: 9.1 mg/dL (ref 8.4–10.5)
Chloride: 103 mEq/L (ref 96–112)
GFR calc Af Amer: >60 mL/min (ref >60)
GFR calc non Af Amer: >60 mL/min (ref >60)
Potassium: 4.7 mEq/L (ref 3.5–5.1)
Sodium: 137 mEq/L (ref 135–145)

## 2010-07-19 LAB — POCT URINALYSIS DIP (DEVICE)
Bilirubin Urine: NEGATIVE
Glucose, UA: 100 mg/dL — AB
Nitrite: NEGATIVE
Urobilinogen, UA: 0.2 mg/dL (ref 0.0–1.0)

## 2010-07-19 LAB — URINALYSIS, ROUTINE W REFLEX MICROSCOPIC
Glucose, UA: NEGATIVE mg/dL
Hgb urine dipstick: NEGATIVE
Ketones, ur: NEGATIVE mg/dL
Protein, ur: NEGATIVE mg/dL

## 2010-07-19 LAB — DIFFERENTIAL
Eosinophils Absolute: 0.2 10*3/uL (ref 0.0–0.7)
Eosinophils Relative: 3 % (ref 0–5)
Lymphs Abs: 1.3 10*3/uL (ref 0.7–4.0)
Monocytes Relative: 6 % (ref 3–12)

## 2010-07-19 LAB — CBC
MCHC: 34.6 g/dL (ref 30.0–36.0)
RBC: 3.73 MIL/uL — ABNORMAL LOW (ref 4.22–5.81)
WBC: 7.5 10*3/uL (ref 4.0–10.5)

## 2010-07-21 LAB — CBC
HCT: 33 % — ABNORMAL LOW (ref 39.0–52.0)
Hemoglobin: 11.8 g/dL — ABNORMAL LOW (ref 13.0–17.0)
MCHC: 35.7 g/dL (ref 30.0–36.0)
MCV: 91.6 fL (ref 78.0–100.0)
Platelets: 150 K/uL (ref 150–400)
RBC: 3.6 MIL/uL — ABNORMAL LOW (ref 4.22–5.81)
RDW: 14 % (ref 11.5–15.5)
WBC: 6.1 K/uL (ref 4.0–10.5)

## 2010-07-21 LAB — DIFFERENTIAL
Basophils Absolute: 0 K/uL (ref 0.0–0.1)
Basophils Relative: 0 % (ref 0–1)
Eosinophils Absolute: 0.2 K/uL (ref 0.0–0.7)
Eosinophils Relative: 4 % (ref 0–5)
Lymphocytes Relative: 19 % (ref 12–46)
Lymphs Abs: 1.1 K/uL (ref 0.7–4.0)
Monocytes Absolute: 0.4 K/uL (ref 0.1–1.0)
Monocytes Relative: 6 % (ref 3–12)
Neutro Abs: 4.3 K/uL (ref 1.7–7.7)
Neutrophils Relative %: 71 % (ref 43–77)

## 2010-07-21 LAB — POCT CARDIAC MARKERS
CKMB, poc: 1.2 ng/mL (ref 1.0–8.0)
Myoglobin, poc: 73.9 ng/mL (ref 12–200)
Troponin i, poc: <0.05 ng/mL (ref 0.00–0.09)

## 2010-07-21 LAB — POCT I-STAT, CHEM 8
BUN: 23 mg/dL (ref 6–23)
Calcium, Ion: 1.12 mmol/L (ref 1.12–1.32)
Chloride: 105 mEq/L (ref 96–112)
Glucose, Bld: 139 mg/dL — ABNORMAL HIGH (ref 70–99)

## 2010-07-21 NOTE — Assessment & Plan Note (Signed)
Summary: LIPID ROV/EAC/TT   Primary Provider:  Ursula Beath   History of Present Illness: [name] presents to lipid clinic today for dyslipidemia follow-up.  His current cholesterol medication regimen includes crestor (he could not remember the dose), fenofibrate 160 mg daily, and fish oil 1000 mg capsules - 3 capsules twice daily.  Pts compliance seems to be very consistent with little to no missed doses.  Pt is otherwise tolerating this regimen and does not complain of adverse events. Recent change in simvastatin to crestor made by md should help his lipid profile.  Compliance with dietary and lifesyle modifications has improved. Patient currently eats eggs daily with toast, 2 slices of bacon, and a biscuit most days of the week (from [employer]).  Patient is still not willing to change from whole to 2% milk, but convinced him to try more water throughout his day. Patient continues to work on diet.    Patient has also improved his exercise regimen and is now walking at the park every day for at least 20 minutes.  He makes 3 laps around the park (4 laps = 1 mile) at a slow-mod pace.  Patient intends to increase to 4 laps per day.    Lipid Management Provider: Samantha Crimes, PharmD  Preventive Screening-Counseling & Management  Alcohol-Tobacco     Alcohol drinks/day: 0     Smoking Status: never  Current Medications (verified): 1)  Fenofibrate 160 Mg Tabs (Fenofibrate) .... Take One Tablet By Mouth Daily With A Meal 2)  Atenolol 100 Mg Tabs (Atenolol) .... Take One Tablet By Mouth Two Times A Day 3)  Fish Oil 1200 Mg Caps (Omega-3 Fatty Acids) .... Take On Cap 3 Cap Bid 4)  Pantoprazole Sodium 40 Mg Tbec (Pantoprazole Sodium) .[name].. 1 Tab Once Daily 5)  Metformin Hcl 1000 Mg Tabs (Metformin Hcl) .... Take One Tab Two Times A Day With Meals 6)  Glipizide 10 Mg Tabs (Glipizide) .... Take One Tab Two Times A Day With Meals 7)  Epitol 200 Mg Tabs (Carbamazepine) .... Take One Tab Two Times  A Day 8)  Centrum  Tabs (Multiple Vitamins-Minerals) .... Take On Tab Once Daily 9)  Aspirin 81 Mg Tbec (Aspirin) .... Take One Tab Once Daily 10)  Vitamin D 400 Unit Caps (Cholecalciferol) .... Take On Tab Two Times A Day 11)  Cinnamon 500 Mg Caps (Cinnamon) .... Take Two Tabs Once Daily 12)  Lamotrigine 25 Mg Tabs (Lamotrigine) .[name].. 1 Tab At Bedtime 13)  Meclizine Hcl 25 Mg Tabs (Meclizine Hcl) .[name].. 1 Tab Three Times A Day 14)  Losartan Potassium 100 Mg Tabs (Losartan Potassium) .[name].. 1 Tablet Daily. 15)  Oxybutynin Chloride 5 Mg Xr24h-Tab (Oxybutynin Chloride) .[name].. 1 Tablet Daily. 16)  Tamsulosin Hcl 0.4 Mg Caps (Tamsulosin Hcl) .[name].. 1 Tablet Daily. 17)  Finasteride 5 Mg Tabs (Finasteride) .[name].. 1 Tablet Daily 18)  Buspirone Hcl 10 Mg Tabs (Buspirone Hcl) .[name].. 1 Tablet Twice Daily 19)  Lipitor 80 Mg Tabs (Atorvastatin Calcium) .[name].. 1 Tablet Daily.  Allergies (verified): No Known Drug Allergies  Vital Signs:  Patient profile:   55 year old male Height:      69 inches Weight:      260 pounds BMI:     38.53 Pulse rate:   80 / minute BP sitting:   134 / 82  (right arm)   Impression & Recommendations:  Problem # 1:  PURE HYPERCHOLESTEROLEMIA (ICD-272.0) Total cholest decreased from 247 to 212 and TG have come down  from 336 to 309. LDL is also decreasing from 155 to 137.   The big challenge for [name] is diet and excercise. I spoke with him at length about his diet and a plan for change. We agreed upon a plan to decrease meals from twice a day bolus feeding with late night snack (2 sandwhiches and chips) to several small meals throughout the day. I explained why this setup would work better not only for his cholesterol but also for his diabetes. I also encouraged him to stay away from eating food 3-4 hours before he goes to bed at night. It doesnt seem as though he drinks a lot throughout the day so encouraged him to drink more and to make it water not sundrop or sweet tea. I encouraged  [name] to push himself and to walk at least a mile everyday if at all possible by his next appt in 3 months. He spoke with me about his ankle pain and the possibility of spliting the mile up into two sessions. I told him that would be fine, but all at once would be preferable.  Will f/u with pt in  ~ 4 months.  His updated medication list for this problem includes:    Fenofibrate 160 Mg Tabs (Fenofibrate) .[name]... Take one tablet by mouth daily with a meal    Lipitor 80 Mg Tabs (Atorvastatin calcium) .[name]... 1 tablet daily.

## 2010-07-22 LAB — POCT URINALYSIS DIP (DEVICE)
Glucose, UA: NEGATIVE mg/dL
Specific Gravity, Urine: 1.02 (ref 1.005–1.030)
Urobilinogen, UA: 0.2 mg/dL (ref 0.0–1.0)

## 2010-07-26 LAB — URINALYSIS, ROUTINE W REFLEX MICROSCOPIC
Glucose, UA: NEGATIVE mg/dL
Nitrite: NEGATIVE
Protein, ur: NEGATIVE mg/dL
Urobilinogen, UA: 0.2 mg/dL (ref 0.0–1.0)

## 2010-08-03 LAB — GLUCOSE, CAPILLARY: Glucose-Capillary: 129 mg/dL — ABNORMAL HIGH (ref 70–99)

## 2010-08-05 LAB — DIFFERENTIAL
Basophils Absolute: 0 10*3/uL (ref 0.0–0.1)
Basophils Absolute: 0 10*3/uL (ref 0.0–0.1)
Basophils Relative: 0 % (ref 0–1)
Eosinophils Absolute: 0.1 10*3/uL (ref 0.0–0.7)
Eosinophils Relative: 2 % (ref 0–5)
Lymphocytes Relative: 27 % (ref 12–46)
Lymphs Abs: 1.1 10*3/uL (ref 0.7–4.0)
Lymphs Abs: 1.7 10*3/uL (ref 0.7–4.0)
Monocytes Absolute: 0.4 10*3/uL (ref 0.1–1.0)
Monocytes Relative: 4 % (ref 3–12)
Monocytes Relative: 8 % (ref 3–12)
Neutro Abs: 5 10*3/uL (ref 1.7–7.7)
Neutro Abs: 4.1 10*3/uL (ref 1.7–7.7)
Neutrophils Relative %: 66 % (ref 43–77)
Neutrophils Relative %: 80 % — ABNORMAL HIGH (ref 43–77)

## 2010-08-05 LAB — URINALYSIS, ROUTINE W REFLEX MICROSCOPIC
Glucose, UA: NEGATIVE mg/dL
Glucose, UA: NEGATIVE mg/dL
Glucose, UA: NEGATIVE mg/dL
Hgb urine dipstick: NEGATIVE
Hgb urine dipstick: NEGATIVE
Ketones, ur: 15 mg/dL — AB
Leukocytes, UA: NEGATIVE
Leukocytes, UA: NEGATIVE
Nitrite: NEGATIVE
Protein, ur: NEGATIVE mg/dL
Protein, ur: 30 mg/dL — AB
Specific Gravity, Urine: 1.02 (ref 1.005–1.030)
Specific Gravity, Urine: 1.03 (ref 1.005–1.030)
pH: 5 (ref 5.0–8.0)
pH: 5 (ref 5.0–8.0)

## 2010-08-05 LAB — HEPATIC FUNCTION PANEL
Bilirubin, Direct: 0.3 mg/dL (ref 0.0–0.3)
Indirect Bilirubin: 0.4 mg/dL (ref 0.3–0.9)

## 2010-08-05 LAB — VITAMIN B12: Vitamin B-12: 320 pg/mL (ref 211–911)

## 2010-08-05 LAB — BASIC METABOLIC PANEL
BUN: 17 mg/dL (ref 6–23)
CO2: 24 mEq/L (ref 19–32)
Calcium: 9.8 mg/dL (ref 8.4–10.5)
Calcium: 9.4 mg/dL (ref 8.4–10.5)
Creatinine, Ser: 1.18 mg/dL (ref 0.4–1.5)
GFR calc Af Amer: >60 mL/min (ref >60)
GFR calc Af Amer: >60 mL/min (ref >60)
GFR calc non Af Amer: 55 mL/min — ABNORMAL LOW (ref >60)
Glucose, Bld: 159 mg/dL — ABNORMAL HIGH (ref 70–99)
Potassium: 4 mEq/L (ref 3.5–5.1)
Potassium: 3.7 mEq/L (ref 3.5–5.1)
Sodium: 137 mEq/L (ref 135–145)

## 2010-08-05 LAB — CBC
HCT: 34.3 % — ABNORMAL LOW (ref 39.0–52.0)
HCT: 38.7 % — ABNORMAL LOW (ref 39.0–52.0)
Hemoglobin: 12.1 g/dL — ABNORMAL LOW (ref 13.0–17.0)
Hemoglobin: 13.3 g/dL (ref 13.0–17.0)
MCHC: 34.3 g/dL (ref 30.0–36.0)
MCHC: 35.2 g/dL (ref 30.0–36.0)
Platelets: 223 10*3/uL (ref 150–400)
RDW: 13.3 % (ref 11.5–15.5)
RDW: 13.6 % (ref 11.5–15.5)
WBC: 6.4 10*3/uL (ref 4.0–10.5)

## 2010-08-05 LAB — COMPREHENSIVE METABOLIC PANEL
Alkaline Phosphatase: 48 U/L (ref 39–117)
BUN: 16 mg/dL (ref 6–23)
CO2: 27 mEq/L (ref 19–32)
GFR calc non Af Amer: 53 mL/min — ABNORMAL LOW (ref >60)
Glucose, Bld: 134 mg/dL — ABNORMAL HIGH (ref 70–99)
Potassium: 4.4 mEq/L (ref 3.5–5.1)
Total Protein: 6.5 g/dL (ref 6.0–8.3)

## 2010-08-05 LAB — URINE CULTURE

## 2010-08-05 LAB — TROPONIN I: Troponin I: 0.02 ng/mL (ref 0.00–0.06)

## 2010-08-05 LAB — URINE MICROSCOPIC-ADD ON

## 2010-08-05 LAB — CARDIAC PANEL(CRET KIN+CKTOT+MB+TROPI)
CK, MB: 1.4 ng/mL (ref 0.3–4.0)
Relative Index: INVALID (ref 0.0–2.5)
Relative Index: INVALID (ref 0.0–2.5)
Troponin I: 0.02 ng/mL (ref 0.00–0.06)
Troponin I: 0.02 ng/mL (ref 0.00–0.06)
Troponin I: 0.03 ng/mL (ref 0.00–0.06)

## 2010-08-05 LAB — GLUCOSE, CAPILLARY: Glucose-Capillary: 132 mg/dL — ABNORMAL HIGH (ref 70–99)

## 2010-08-05 LAB — CK TOTAL AND CKMB (NOT AT ARMC)
CK, MB: 1.5 ng/mL (ref 0.3–4.0)
Relative Index: INVALID (ref 0.0–2.5)

## 2010-08-05 LAB — CARBAMAZEPINE LEVEL, TOTAL: Carbamazepine Lvl: 4.4 ug/mL (ref 4.0–12.0)

## 2010-08-05 LAB — T4, FREE: Free T4: 1.69 ng/dL (ref 0.80–1.80)

## 2010-08-05 LAB — RPR: RPR Ser Ql: NONREACTIVE

## 2010-08-05 LAB — MAGNESIUM: Magnesium: 2 mg/dL (ref 1.5–2.5)

## 2010-08-05 LAB — TSH: TSH: 2.247 u[IU]/mL (ref 0.350–4.500)

## 2010-09-18 NOTE — H&P (Signed)
[facility]  Patient:    Robert Walters, Robert Walters Visit Number: [id number] MRN: [id number]          Service Type: EMS Location: ED Attending Physician:  Cassell Smiles. Dictated by:   Candi Leash. Orsini, N.P. Admit Date:  09/10/2001 Discharge Date: 09/10/2001                     Psychiatric Admission Assessment  IDENTIFYING INFORMATION:  This is a 55 year old widowed white male voluntarily admitted on Sep 10, 2001 for depression and suicidal ideation.  HISTORY OF PRESENT ILLNESS:  The patient presents with a history of depression, anxiety.  He reports decreased appetite with a 35-pound weight loss.  He reports he has had extensive medical workup for lack of energy.  He has been having thoughts of suicidal ideation with no specific plan.  The patient reports feeling very anxious, having agoraphobia, having panic attacks at home.  He is having difficulty sleeping, only 3-4 hours a night.  The patient reports that he has been stressed by his fathers illness, who has colon cancer.  Been taking a fair amount of time off of work.  The patient denies any psychotic symptoms and is currently denying any suicidal or homicidal ideation.  The patient reports other symptomatic complaints.  PAST PSYCHIATRIC HISTORY:  First hospitalization to [facility]. He had a couple of sessions at [facility]  No prior suicide attempt.  No other hospitalizations.  SOCIAL HISTORY:  This is a 55 year old widowed white male.  His wife died 20 years ago.  He has a child, 51 years of age.  He lives with her parents.  He works at [employer] for 2-1/2 years.  No legal problems.  Completed the 12th grade.  FAMILY HISTORY:  None.  ALCOHOL/DRUG HISTORY:  Nonsmoker.  Denies any alcohol or substance abuse.  PRIMARY CARE Kieren Adkison:  Dr. Dwana Curd on [place] [address] [facility].  MEDICAL PROBLEMS:  Hypertension and GERD.  MEDICATIONS:  Atenolol 50 mg q.d.,  Protonix 40 mg q.d., lorazepam 0.5 mg t.i.d., Paxil 20 mg (has been on that for one month).  DRUG ALLERGIES:  No known allergies.  PHYSICAL EXAMINATION:  Performed at [facility].  LABORATORY DATA:  Potassium 3.4.  CBC within normal limits.  Urine drug screen negative.  Alcohol level less than 5.  MENTAL STATUS EXAMINATION:  He is an alert, middle-aged Caucasian male.  He is casually dressed and very cooperative.  Speech is normal and relevant.  Mood is depressed.  Affect is flat.  Thought processes are coherent.  There is no evidence of psychosis.  No auditory or visual hallucinations.  No suicidal or homicidal ideation.  No paranoia.  Cognitive function intact.  Memory is fair. Judgment and insight fair.  DIAGNOSES: Axis I:    Major depression with suicidal ideation. Axis II:   Deferred. Axis III:  1. Hypertension.            2. Gastroesophageal reflux disease. Axis IV:   Problems with primary support group. Axis V:    Current 30; past year 52.  PLAN:  Voluntary admission for depression and suicidal ideation.  Contract for safety.  Check every 15 minutes.  Will resume his routine medications. Monitor his blood pressure.  Have Ambien available for sleep.  The patient is to attend groups.  Initiate Paxil CR for depression and anxiety.  Our goal is to stabilize mood and thinking so patient can be safe and for patient to  follow up with [facility].  TENTATIVE LENGTH OF STAY:  Three to five days. Dictated by:   Candi Leash. Orsini, N.P. Attending Physician:  Cassell Smiles DD:  09/11/01 TD:  09/11/01 Job: [id number]

## 2010-09-18 NOTE — Discharge Summary (Signed)
   NAME:  Robert Walters, Robert Walters NO.:  [id number]   MEDICAL RECORD NO.:  [id number]                   PATIENT TYPE:  INP   LOCATION:  2022                                 FACILITY:  [facility]   PHYSICIAN:  Darlin Priestly, M.D.             DATE OF BIRTH:  [birth year]   DATE OF ADMISSION:  12/13/2001  DATE OF DISCHARGE:  12/16/2001                                 DISCHARGE SUMMARY   DISCHARGE DIAGNOSES:  1. Unstable angina, myocardial infarction ruled out.  2. History of depression.  3. History of hypertension.  4. Hyperlipidemia.   HOSPITAL COURSE:  The patient is a 55 year old male with risk factors for  coronary disease including hypertension and hyperlipidemia.  Apparently, he  does not take statins because of abdominal pain.  He was admitted December 13, 2001 by Dr. Jenne Campus with chest pain.  He had had a previous negative GI  workup in the past.  Initial EKG showed sinus rhythm, inferior MI, anterior  T wave inversion which was old.  The patient was started on heparin.  His  CK, MB's, and troponins were obtained.  These were negative for an MI.  The  patient did voice suicidal tendencies to one of the nurses and was placed on  suicide precautions and a sitter was obtained.  The patient was seen in  consult by psychiatric service December 15, 2001, Dr. Jeanie Sewer.  The patient  apparently is known to the psychiatric service.  Followup is arranged.  He  will continue Paxil for depression and Depakote.  The patient was discharged  December 16, 2001.   DISCHARGE MEDICATIONS:  1. Protonix 40 mg a day.  2. Atenolol 50 mg a day.  3. Xanax 0.5 mg twice a day.  4. Depakote 500 mg twice a day.  5. Paxil CR 37.5 h.s.  6. Reglan 10 mg a.c. and h.s.   LABORATORY DATA:  EKG sinus rhythm with inferior Q's and T wave inversion in  V2, V3.  Chest x-ray shows no active disease.  White count 8.0, hemoglobin  11.9, hematocrit 34.7, platelets 184.  INR 1.1.  Sodium 136,  potassium 3.9,  BUN 15.  Liver functions are normal.  CK, MB, and troponins are negative.  Lipid profile shows cholesterol 300, triglycerides 280, LDL 192, HDL 52.    DISPOSITION:  The patient is discharged in stable condition and will follow  up with his primary care doctor, Dr. Smith Mince and the psychiatric service.     Abelino Derrick, P.A.                      Darlin Priestly, M.D.    [name]  D:  12/28/2001  T:  12/31/2001  Job:  [id number]   cc:   Talmadge Coventry, M.D.   [facility]

## 2010-09-18 NOTE — Procedures (Signed)
[place]. [facility]  Patient:    BOWDEN, BOODY Visit Number: [id number] MRN: [id number]          Service Type: END Location: ENDO Attending Physician:  Orland Mustard Dictated by:   Llana Aliment. Randa Evens, M.D. Proc. Date: 07/06/01 Admit Date:  07/06/2001   CC:         Talmadge Coventry, M.D.   Procedure Report  PROCEDURE:  Esophagogastroduodenoscopy.  SURGEON:  Jahn L. Randa Evens, M.D.  MEDICATIONS:  Cetacaine spray, fentanyl 75 mcg, and Versed 50 mg IV.  INDICATIONS:  Persistent bloating, upper abdominal symptoms despite aggressive therapy for reflux.  DESCRIPTION OF PROCEDURE:  The procedure had been explained to the patient and consent obtained.  With the patient in the left lateral decubitus position, the Olympus video endoscope was inserted blindly into the esophagus and advanced under direct visualization.  The stomach was entered.  The pylorus identified and passed.  The duodenum including the bulb and second portion were seen well.  These were completely normal.  There is no ulcer or gastric outlet obstruction.  The pyloric channel was widely patent.  The antrum and body of the stomach were normal.  The fundus and cardia were seen well on retroflexed view and were normal.  There was a hiatal hernia with a slightly reddened distal esophagus, but no ulcerations.  The proximal esophagus was normal.  The scope was withdrawn.  The patient tolerated the procedure well.  ASSESSMENT:  Hiatal hernia with probably gastroesophageal reflux disease.  PLAN:  We will keep the patient on the same medication for now and add Reglan. See back in the office in two months. Dictated by:   Llana Aliment. Randa Evens, M.D. Attending Physician:  Orland Mustard DD:  07/06/01 TD:  07/07/01 Job: [id number]

## 2010-09-18 NOTE — Consult Note (Signed)
[facility]  Patient:    Robert Walters, Robert Walters Visit Number: [id number] MRN: [id number]          Service Type: MED Location: [phone] Attending Physician:  Talmadge Coventry Dictated by:   Llana Aliment. Randa Evens, M.D. Proc. Date: 07/27/01 Admit Date:  07/27/2001   CC:         Talmadge Coventry, M.D.   Consultation Report  DATE OF BIRTH:  18-Mar-1956  REASON FOR CONSULTATION:  Nausea, vomiting.  HISTORY:  Mr. Germano is a 55 year old gentleman who has had esophageal reflux in the past.  He has had previous endoscopies, the last of which was just done on March 6.  The reason he had this endoscopy was that he had had several weeks of episodic upper abdominal pain, nausea and vomiting, and bloating. Labs obtained at the end of February showed a slightly elevated BUN but completely normal liver tests.  He had a gallbladder ultrasound that was normal.  Several years ago he had a similar set of symptoms and was worked up even including and MRI and MRCP of the pancreas, and CT scans all of which were normal.  There is additional finding that his father has just recently been diagnosed with colon cancer, and for this reason the patient was scheduled for elective outpatient colonoscopy.  He had been given Reglan orally, and in spite of all this he has continued to vomit and had to be admitted to the hospital this morning.  CURRENT MEDICATIONS: 1. Zocor 80 mg q.d. 2. Atenolol 100 mg q.d. 3. Prevacid 30 mg q.d. 4. Reglan 10 mg q.i.d.  ALLERGIES:  No drug allergies.  PAST MEDICAL HISTORY: 1. Primarily as mentioned. 2. History of hypertension. 3. History of hyperlipidemia. 4. History of prostatitis. 5. Degenerative joint disease. 6. He has never had any abdominal surgery.  FAMILY HISTORY:  His father at age 81 was just diagnosed with colon cancer. No other family history of GI cancer.  SOCIAL HISTORY:  Nonsmoker.  He works at [employer].  He is  divorced.  He does not drink.  REVIEW OF SYSTEMS:  Remarkable primarily for anorexia, nausea, early satiety with intermittent abdominal pain.  He has no cardiopulmonary symptoms of significance.  PHYSICAL EXAMINATION:  VITAL SIGNS:  Patient is afebrile, vital signs are not remarkable.  GENERAL:  Pleasant nonicteric white male in no acute distress.  HEENT:  Eyes; extraocular movements intact.  NECK:  Supple.  LUNGS:  Clear.  HEART:  Regular rate and rhythm without murmurs or gallops.  ABDOMEN:  Nondistended, soft and completely nontender.  The stool was guaiac positive in my office and is also guaiac positive today by Dr. Smith Mince.  ASSESSMENT: 1. Nausea and vomiting of questionable cause - The source of his nausea and    vomiting is unclear.  He has no obvious GI explanation.  I think in view of    his previous history of what was felt to be a pancreatic problem, we ought    to go ahead and look at his pancreas and CT scan.  Other possible    explanations for nausea and vomiting will need to be explored such as drug    related nausea and vomiting, etcetera. 2. Family history of colon cancer.  This is a new diagnosis for his father.  I    think he needs an elective colonoscopy.  With the lack of blood in the    stool on several occasions, I do not know  that this is urgent.  He probably    would not be able to tolerate a prep until his nausea is adequately    controlled.  PLAN: 1. Will go ahead with a CT scan for the abdomen and pelvis with oral and IV    contrast. 2. Will check the labs that have already been ordered. Dictated by:   Llana Aliment. Randa Evens, M.D. Attending Physician:  Talmadge Coventry DD:  07/27/01 TD:  07/28/01 Job: [id number]

## 2010-09-18 NOTE — Discharge Summary (Signed)
[place]. [facility]  Patient:    Robert Walters, Robert Walters Visit Number: [id number] MRN: [id number]          Service Type: MED Location: [phone] Attending Physician:  Orland Mustard Dictated by:   Llana Aliment. Randa Evens, M.D. Admit Date:  08/07/2001 Discharge Date: 08/10/2001   CC:         Talmadge Coventry, M.D.   Discharge Summary  DATE OF BIRTH:  2056/04/02  ADMISSION DIAGNOSES: 1. Abdominal pain. 2. Nausea and vomiting. 3. Weight loss.  FINAL DIAGNOSES: 1. Symptoms resolved, etiology unclear.  Felt to be ______. 2. History of gastroesophageal reflux disease. 3. History of hypertension and hyperlipidemia. 4. History of prostatitis.  HISTORY OF PRESENT ILLNESS:  The patient with esophageal reflux.  He has had problems with nausea and vomiting, and vague abdominal symptoms since early March.  He was admitted to the hospital.  Had several outpatient tests done. His hospital admission was brief, and he got rapidly better while in the hospital, tolerating a diet without any vomiting seen.  Currently, he has had an endoscopy, ultrasound of the abdomen, CT scan of the abdomen and pelvis all of which were normal.  All of his labs were normal.  This was at [facility].  He had a similar episode approximately two years ago.  At that time, he had all of the same studies done again which were normal, including a MRI of the abdomen, and MRCP of the pancreas as well.  The patient went home, continued to feel sick, vomited everything, no no fever or chills, was unable to keep down anything, came back into the office with a 20 pound weight loss, and is admitted for further evaluation and therapy.  PHYSICAL EXAMINATION:  VITAL SIGNS:  Temperature 98, pulse 64, blood pressure 146/84, weight 220.  GENERAL:  A pleasant white male who did appear to be ill.  HEART:  Normal.  LUNGS:  Normal.  ABDOMEN:  Soft and nontender.  RECTAL:  The stool is brown  and heme negative.  For more details, please see dictated admission history and physical.  HOSPITAL COURSE:  The patient was admitted to the medical floor, given IV fluids.  Labs, including sedimentation rate, urinalysis, CBC, amylase, liver function tests, all were normal.  The patient underwent a colonoscopy which had tentatively been planned anyway.  Due to his symptoms and his family history of colon cancer, this was normal with normal colonic biopsies showing no signs of colitis.  By the following day the patient felt much better.  He was started on liquids which he was tolerating.  We went ahead with a CT scan of the head, ultrasound and small bowel series.  All of these were all normal again.  He did have fatty liver on ultrasound.  Following these studies, the patient had been started on a low residue diet which he was tolerating without difficulty.  He was treated empirically with trazodone and Ativan in the hospital which seemed to improve his symptoms.  He had no further vomiting.  DISPOSITION:  The patient is discharged home with no documented vomiting in the hospital with all of the above studies being negative, including laboratory work.  DISCHARGE MEDICATIONS: 1. Protonix 40 mg q.d. 2. Trazodone 150 mg q.h.s. 3. Ativan 1 mg t.i.d. 4. Reglan 10 mg a.c. and h.s.  DIET:  Unrestricted.  FOLLOWUP: 1. He will call and make an appointment for followup with Dr. Talmadge Coventry in  2 to 4 weeks. 2. Dr. Carman Ching in 4 to 6 weeks. Dictated by:   Llana Aliment. Randa Evens, M.D. Attending Physician:  Orland Mustard DD:  08/10/01 TD:  08/11/01 Job: [id number]

## 2010-09-18 NOTE — Op Note (Signed)
[place]. [facility]  Patient:    Robert Walters, Robert Walters Visit Number: [id number] MRN: [id number]          Service Type: MED Location: [phone] Attending Physician:  Orland Mustard Dictated by:   Llana Aliment. Randa Evens, M.D. Proc. Date: 08/08/01 Admit Date:  08/07/2001   CC:         Talmadge Coventry, M.D.   Operative Report  PROCEDURE:  Colonoscopy and biopsy.  MEDICATIONS:   Fentanyl 125 mcg and Versed 10 mg IV.  SCOPE:  Olympus Pediatric Video Colonoscope.  INDICATIONS:  The patient with a multitude of gastrointestinal symptoms including abdominal pain and diarrhea. He has a strong family history of colon cancer. This is done as part of his continuing workup.  DESCRIPTION OF PROCEDURE:  The procedure had been explained to the patient and consent obtained. With the patient in the left lateral decubitus position, the Olympus pediatric video colonoscope was inserted and advanced under direct visualization. The prep was suboptimal. Since he had not received an entire prep. We had a very good look. The mucosa was completely normal throughout . A small polyp could have been missed. We reached the cecum and the ileocecal valve seen. The scope was withdrawn. The cecum, ascending colon, hepatic flexure, transverse, splenic, descending and sigmoid colon was all normal. No polyps. No significant diverticular disease. Multiple random biopsies were taken. The scope was withdrawn. The patient tolerated the procedure well.  ASSESSMENT:  Normal mucosa with no evidence of polyps.  PLAN:  We will go ahead and feed the patient. Will recommend a small bowel series and possibly a CT of the head. Will recommend repeating this procedure in 5 years due to his family history of colon cancer. Dictated by:   Llana Aliment. Randa Evens, M.D. Attending Physician:  Orland Mustard DD:  08/08/01 TD:  08/08/01 Job: [id number]

## 2010-09-18 NOTE — Discharge Summary (Signed)
[facility]  Patient:    Robert Walters, Robert Walters Visit Number: [id number] MRN: [id number]          Service Type: PSY Location: [phone] Attending Physician:  Jasmine Pang Dictated by:   Reymundo Poll Dub Mikes, M.D. Admit Date:  09/10/2001 Discharge Date: 09/14/2001                             Discharge Summary  CHIEF COMPLAINT AND HISTORY OF PRESENT ILLNESS:  This was the first admission to [facility] for this 55 year old male voluntarily admitted for depression and suicidal ideas.  History of depression and anxiety, decreased appetite, 35 pound weight loss, extensive medical workup for lack of energy.  Has been having thoughts of suicide with no specific plan.  Feeling very anxious, having agoraphobia, having panic attacks at home. Difficulty sleeping, three to four hours a night.  Stressed by his fathers illness who has colon cancer.  Taking a fair amount of time off of work.  No psychotic symptoms, denied any active suicidal ideas.  PAST PSYCHIATRIC HISTORY:  First time inpatient.  Couple of sessions at [facility].  SUBSTANCE ABUSE HISTORY:  Denies the use or abuse of any substances.  PAST MEDICAL HISTORY:  Hypertension, gastroesophageal reflux disease.  MEDICATIONS ON ADMISSION: 1. Atenolol 50 mg daily. 2. Protonix 40 mg daily. 3. Lorazepam 0.5 mg three times a day. 4. Paxil 20 mg daily.  PHYSICAL EXAMINATION:  GENERAL:  Performed and failed to show any acute findings.  MENTAL STATUS EXAMINATION ON ADMISSION:  Alert, middle-aged white male, casually dressed, very cooperative.  Speech was normal and relevant.  Mood was depressed.  Affect was flat.  Thought processes were coherent, no evidence of psychosis, no auditory or visual hallucinations, no suicidal or homicidal ideation, no paranoia.  Cognitive: Well preserved.  ADMITTING DIAGNOSES: Axis I:    Major depression, single episode. Axis  II:   No diagnosis. Axis III:  1. Hypertension.            2. Gastroesophageal reflux disease. Axis IV:   Moderate. Axis V:    Global assessment of functioning upon admission 30, highest global            assessment of functioning in the last year 75.  LABORATORY DATA:  Blood chemistries were within normal limits.  HOSPITAL COURSE:  He was admitted and started in intensive individual and group psychotherapy.  He was kept on Paxil that was increased up to 37.5 mg per day.  He was placed on Klonopin 0.5 mg twice a day and at bedtime for sleep.  He was given Reglan for his stomach upset.  As the medication was increased, his anxiety decreased, he was ruminating less.  There was some sensation from the medication but he got used to it.  By May 15, he had obtained full benefit from the hospitalization.  There were no suicidal or homicidal ideas, he expressed that he was getting better based on this on what he had accomplished on the inpatient unit, willing and motivated to continue working on an outpatient basis.  As there were no suicidal or homicidal ideations and he was willing and motivated to pursue further outpatient treatment, discharge was considered and granted.  DISCHARGE DIAGNOSES: Axis I:    Major depression, single episode. Axis II:   No diagnosis. Axis III:  1. Arterial hypertension.  2. Gastroesophageal reflux disease. Axis IV:   Moderate. Axis V:    Global assessment of functioning upon discharge 55-60.  DISCHARGE MEDICATIONS: 1. Protonix 40 mg daily. 2. Tenormin 50 mg daily. 3. Paxil CR 37.5 mg daily. 4. Klonopin 0.5 mg twice a day and at bedtime as needed for sleep. 5. Reglan 10 mg half tablet daily. 6. Ambien 10 mg as needed for sleep.  FOLLOWUP:  [facility]. Dictated by:   Reymundo Poll Dub Mikes, M.D. Attending Physician:  Jasmine Pang DD:  10/25/01 TD:  10/25/01 Job: [id number]

## 2010-09-18 NOTE — H&P (Signed)
[place]. [facility]  Patient:    Robert Walters, Robert Walters Visit Number: [id number] MRN: [id number]          Service Type: MED Location: [phone] Attending Physician:  Talmadge Coventry Dictated by:   Llana Aliment. Randa Evens, M.D. Admit Date:  07/27/2001 Discharge Date: 07/29/2001   CC:         Talmadge Coventry, M.D.   History and Physical  DATE OF BIRTH:  [birth year]  REASON FOR ADMISSION:  Abdominal pain, nausea and vomiting, and weight loss.  HISTORY:  A 55 year old who has had esophageal reflux, has had previous endoscopies.  He has been having problems with nausea and vomiting and vague symptoms.  We saw him in early March for these symptoms, and since then he has had to be admitted to the hospital, has had several tests and procedures done. He has had an ultrasound of the abdomen with CT scan of the abdomen and pelvis, endoscopy - all of which are normal; all of his labs were normal.  He had similar symptoms about two years ago, had an MRI of abdomen, MRCT of the pancreas, and CT scans - all of which were normal - and he gradually got better.  The patient has continued to complain of the nausea and vomiting, went home from the hospital, still has felt sick; has been unable to eat, vomits nearly everything, has had no fever or chills but has been unable to keep down anything.  Comes in today as an urgent work-in after discussion with the patient and his family.  He is having lower abdominal pain.  The material that he vomits tends to be what he has already eaten.  He is having some diarrhea.  CURRENT MEDICATIONS: 1. Atenolol 100 mg q.d. 2. Hyoscyamine p.r.n. 3. Phenergan suppositories p.r.n. 4. Prevacid 30 mg q.d. 5. Ativan one t.i.d. p.r.n. 6. Reglan b.i.d.  ALLERGIES:  No drug allergies.  MEDICAL HISTORY: 1. History gastroesophageal reflux disease with hiatal hernia on previous    endoscopies. 2. History of hypertension and  hyperlipidemia. 3. History of prostatitis. 4. Degenerative joint disease. 5. No previous abdominal surgeries.  FAMILY HISTORY:  Notable that his father has just been diagnosed with colon cancer and apparently had surgery.  The patient himself is scheduled for an elective colonoscopy in a couple of days.  No other family history of GI cancer.  SOCIAL HISTORY:  The patient is a nonsmoker.  He works at [employer], is divorced, does not drink.  REVIEW OF SYSTEMS:  Remarkable for anorexia, nausea, early satiety, cramping abdominal pain.  PHYSICAL EXAMINATION:  VITAL SIGNS:  Temperature 98.0, pulse 64, blood pressure 146/84, weight 220.  GENERAL:  A pleasant, quiet male who does appear that he is not feeling well.  HEENT:  Eyes clear and nonicteric.  Ear, nose, and throat are grossly normal.  LUNGS:  Clear.  HEART:  Regular rate and rhythm without murmurs or gallops.  ABDOMEN:  Nondistended and soft with mild tenderness in the lower abdomen. Bowel sounds are present.  No guarding, rigidity, or rebound.  RECTAL:  Reveals normal prostate, is not particularly tender.  Stool is hemoccult negative.  ASSESSMENT:  Nausea, vomiting, vague abdominal pain, and a 15-pound weight loss in the past two weeks - This is quite confusing.  All the patients labs, endoscopy, and x-ray studies have been normal.  He does have a strong family history of colon cancer and I think in view of that it would  certainly be reasonable to go ahead and proceed with the colonoscopy.  I do not know that he can take the prep, but this is the one part of his GI tract that has not been looked at.  Other possible explanations could be intestinal ischemia.  PLAN:  Will go ahead and admit to the hospital, give IV fluids.  Will plan a colonoscopy tomorrow.  Will prep him with some Lactulose and tap water enemas. Will check the routine labs including a sed rate, and consider another CT or small bowel series if  colonoscopy is negative. Dictated by:   Llana Aliment. Randa Evens, M.D. Attending Physician:  Talmadge Coventry DD:  08/07/01 TD:  08/07/01 Job: [id number]

## 2010-10-23 ENCOUNTER — Encounter: Payer: Self-pay | Admitting: Nurse Practitioner

## 2010-10-30 ENCOUNTER — Ambulatory Visit (INDEPENDENT_AMBULATORY_CARE_PROVIDER_SITE_OTHER): Payer: Medicare Other | Admitting: Nurse Practitioner

## 2010-10-30 ENCOUNTER — Encounter: Payer: Self-pay | Admitting: Nurse Practitioner

## 2010-10-30 VITALS — BP 130/60 | HR 72 | Wt 255.0 lb

## 2010-10-30 DIAGNOSIS — E7800 Pure hypercholesterolemia, unspecified: Secondary | ICD-10-CM

## 2010-10-30 DIAGNOSIS — Z9189 Other specified personal risk factors, not elsewhere classified: Secondary | ICD-10-CM

## 2010-10-30 DIAGNOSIS — N183 Chronic kidney disease, stage 3 unspecified: Secondary | ICD-10-CM | POA: Insufficient documentation

## 2010-10-30 DIAGNOSIS — I10 Essential (primary) hypertension: Secondary | ICD-10-CM

## 2010-10-30 NOTE — Patient Instructions (Addendum)
Stay on your current medicines Regular exercise is encouraged. I will see you in 6 months. Dr. Elease Hashimoto is going to be your new cardiologist Call for any problems

## 2010-10-30 NOTE — Assessment & Plan Note (Signed)
He has multiple risk factors for CV disease. Last stress test was in December and was ok. I have encouraged him to work on his diet/exercise and weight loss. I'm not sure he has the motivation to follow thru. We will continue with his current medicines. I will see him again in 6 months. Patient is agreeable to this plan and will call if any problems develop in the interim.

## 2010-10-30 NOTE — Assessment & Plan Note (Signed)
Labs are checked by his PCP. He has been switched to Crestor.

## 2010-10-30 NOTE — Progress Notes (Signed)
Robert Walters Date of Birth: 12/07/1955   History of Present Illness: Robert Walters is seen today for a 6 month visit. He is seen for Dr. Elease Hashimoto. He is a former patient of Dr. Ronnald Nian and a prior patient of Dr. Silva Bandy. He has had a good 6 months. He continues with chronic GI issues from presumed gastroparesis. No chest pain. His stress test from December was ok. He does not exercise to any significant degree. He seems to be tolerating his medicines. Blood pressure is good at home and at the drug store. Labs are done by his PCP. He has been switched over to Crestor for his lipids. Blood sugar is somewhat variable.   Current Outpatient Prescriptions on File Prior to Visit  Medication Sig Dispense Refill  . aspirin 81 MG tablet Take 81 mg by mouth daily.        [name] atenolol (TENORMIN) 100 MG tablet Take 100 mg by mouth 2 (two) times daily.        . busPIRone (BUSPAR) 10 MG tablet Take 10 mg by mouth 3 (three) times daily.        . carbamazepine (TEGRETOL) 200 MG tablet Take 200 mg by mouth 2 (two) times daily.        . cholecalciferol (VITAMIN D) 1000 UNITS tablet Take 1,000 Units by mouth daily.        [name] CINNAMON PO Take 1,000 mg by mouth 2 (two) times daily.        . fenofibrate 160 MG tablet Take 160 mg by mouth daily.        . Ferrous Sulfate (IRON) 325 (65 FE) MG TABS Take by mouth 2 (two) times daily.        [name] FINASTERIDE PO Take by mouth 2 (two) times daily.        . fish oil-omega-3 fatty acids 1000 MG capsule Take 6 g by mouth daily.        [name] glipiZIDE (GLUCOTROL) 10 MG tablet Take 10 mg by mouth 2 (two) times daily before a meal.        . lamoTRIgine (LAMICTAL) 25 MG tablet Take 25 mg by mouth 2 (two) times daily.        . MECLIZINE HCL PO Take by mouth 2 (two) times daily.        . metFORMIN (GLUCOPHAGE) 1000 MG tablet Take 1,000 mg by mouth 2 (two) times daily with a meal.        . Multiple Vitamin (MULTIVITAMIN) tablet Take 1 tablet by mouth daily.        . pantoprazole  (PROTONIX) 40 MG tablet Take 40 mg by mouth daily.        . rosuvastatin (CRESTOR) 40 MG tablet Take 40 mg by mouth daily.        . Tamsulosin HCl (FLOMAX) 0.4 MG CAPS Take 0.4 mg by mouth daily.        [name] DISCONTD: simvastatin (ZOCOR) 80 MG tablet Take 80 mg by mouth at bedtime.          No Known Allergies  Past Medical History  Diagnosis Date  . Diabetes mellitus   . Hypertension   . Hyperlipidemia   . Obesity   . Acid reflux disease   . Gastritis   . BPH (benign prostatic hypertrophy)   . Vertigo   . Normal nuclear stress test Dec 2011    Past Surgical History  Procedure Date  . Cardiovascular stress test 2009  EF 63%    History  Smoking status  . Never Smoker   Smokeless tobacco  . Not on file    History  Alcohol Use No    Family History  Problem Relation Age of Onset  . Hyperlipidemia Father   . Colon cancer Father     Review of Systems: The review of systems is positive for chronic GI issues.  All other systems were reviewed and are negative.  Physical Exam: BP 130/60  Pulse 72  Wt 255 lb (115.667 kg) Patient is very pleasant and in no acute distress. He is obese. Skin is warm and dry. Color is normal.  HEENT is unremarkable except for missing teeth. Normocephalic/atraumatic. PERRL. Sclera are nonicteric. Neck is supple. No masses. No JVD. Lungs are clear. Cardiac exam shows a regular rate and rhythm. Abdomen is obese and soft. Extremities are without edema. Gait and ROM are intact. No gross neurologic deficits noted.  LABORATORY DATA: n/a   Assessment / Plan:

## 2010-11-09 ENCOUNTER — Other Ambulatory Visit: Payer: Medicare Other | Admitting: *Deleted

## 2010-11-10 ENCOUNTER — Other Ambulatory Visit (INDEPENDENT_AMBULATORY_CARE_PROVIDER_SITE_OTHER): Payer: Medicare Other | Admitting: *Deleted

## 2010-11-10 DIAGNOSIS — E7800 Pure hypercholesterolemia, unspecified: Secondary | ICD-10-CM

## 2010-11-10 LAB — HEPATIC FUNCTION PANEL
ALT: 32 U/L (ref 0–53)
AST: 33 U/L (ref 0–37)
Bilirubin, Direct: 0.2 mg/dL (ref 0.0–0.3)
Total Bilirubin: 0.5 mg/dL (ref 0.3–1.2)
Total Protein: 6.4 g/dL (ref 6.0–8.3)

## 2010-11-10 LAB — LIPID PANEL: Total CHOL/HDL Ratio: 5

## 2010-11-10 LAB — LDL CHOLESTEROL, DIRECT: Direct LDL: 124.3 mg/dL

## 2010-11-15 ENCOUNTER — Other Ambulatory Visit: Payer: Self-pay | Admitting: Nurse Practitioner

## 2010-11-16 ENCOUNTER — Ambulatory Visit (INDEPENDENT_AMBULATORY_CARE_PROVIDER_SITE_OTHER): Payer: Medicare Other

## 2010-11-16 ENCOUNTER — Other Ambulatory Visit: Payer: Self-pay | Admitting: *Deleted

## 2010-11-16 VITALS — Ht 70.0 in | Wt 256.8 lb

## 2010-11-16 DIAGNOSIS — E7800 Pure hypercholesterolemia, unspecified: Secondary | ICD-10-CM

## 2010-11-16 MED ORDER — LOSARTAN POTASSIUM 100 MG PO TABS: 100.0000 mg | ORAL_TABLET | Freq: Every day | ORAL | Status: DC

## 2010-11-16 NOTE — Telephone Encounter (Signed)
Will see lori gerhardt np / needs November visit

## 2010-11-16 NOTE — Telephone Encounter (Signed)
Needs ov with lori np in November

## 2010-11-16 NOTE — Patient Instructions (Addendum)
Continue your current medications.   It is up to you to help improve your cholesterol.   Start limiting your carbohydrates and starches.  Those are things like: potatoes, corn, pinto beans, spaghetti  Increase vegetables high in fiber: turnip greens, spinach, carrots, etc.   Try to limit the coke slushes and orange juice  When you walk, try to increase your pace and get your heart rate up.  You should not be able to have a normal conversation with someone beside you.    Talk to your primary care doctor about seeing a nutritionalist.

## 2010-11-20 NOTE — Progress Notes (Signed)
HPI Robert Walters presents to lipid clinic today for dyslipidemia follow-up. His current cholesterol medication regimen includes Crestor 40mg , fenofibrate 160 mg daily, and fish oil 1000 mg capsules - 3 capsules twice daily. Pts compliance seems to be consistent and he does not complain of adverse events such as muscle pains or aches.  His diabetes is controlled with recent A1c of 6.7.  Compliance with dietary and lifesyle modifications has not improved.  Pt is still eating fast food and lots of processed foods. Breakfast is currently  eggs daily with toast, 2 slices of bacon, and a biscuit with coffee and creamer.  Lunch is a coke or Pepsi slush from the gas station and maybe a hot dog.  The patient does not eat dinner until 8-9pm and that may consist of pinto beans, corn, 2 ham sandwiches, pork and beans, fruit cocktail and orange juice.  He will snack on popcorn.  He does not like to drink water so he drinks OJ, diet Sundrop or milk.  Pt states he knows what to do with his diet but just doesn't.  When questioned, he was unaware of what protein, starches, etc were and what constituted a balanced diet.   Patient does walk at the park on a fairly regular basis.  He walks approximately 3/4 mile in 20 min.  He states he does not exert himself on these walk- it is more at a leisurely pace.   Current Outpatient Prescriptions on File Prior to Visit  Medication Sig Dispense Refill  . aspirin 81 MG tablet Take 81 mg by mouth daily.        [name] atenolol (TENORMIN) 100 MG tablet Take 100 mg by mouth 2 (two) times daily.        . busPIRone (BUSPAR) 10 MG tablet Take 10 mg by mouth 3 (three) times daily.        . carbamazepine (TEGRETOL) 200 MG tablet Take 200 mg by mouth 2 (two) times daily.        . cholecalciferol (VITAMIN D) 1000 UNITS tablet Take 1,000 Units by mouth daily.        [name] CINNAMON PO Take 1,000 mg by mouth 2 (two) times daily.        . fenofibrate 160 MG tablet Take 160 mg by mouth daily.        .  Ferrous Sulfate (IRON) 325 (65 FE) MG TABS Take by mouth 2 (two) times daily.        [name] FINASTERIDE PO Take by mouth 2 (two) times daily.        . fish oil-omega-3 fatty acids 1000 MG capsule Take 6 g by mouth daily.        [name] glipiZIDE (GLUCOTROL) 10 MG tablet Take 10 mg by mouth 2 (two) times daily before a meal.        . lamoTRIgine (LAMICTAL) 25 MG tablet Take 25 mg by mouth 2 (two) times daily.        . MECLIZINE HCL PO Take by mouth 2 (two) times daily.        . metFORMIN (GLUCOPHAGE) 1000 MG tablet Take 1,000 mg by mouth 2 (two) times daily with a meal.        . Multiple Vitamin (MULTIVITAMIN) tablet Take 1 tablet by mouth daily.        . pantoprazole (PROTONIX) 40 MG tablet Take 40 mg by mouth daily.        . rosuvastatin (CRESTOR) 40 MG tablet Take 40 mg by  mouth daily.        . Tamsulosin HCl (FLOMAX) 0.4 MG CAPS Take 0.4 mg by mouth daily.

## 2010-11-20 NOTE — Assessment & Plan Note (Signed)
Pt's cholesterol not improved despite optimal medication therapy.  TC- 218 (goal<200), TG- 467 (goal<150- increased from 309), HDL- 41.8 (goal>40), LDL- 161 (goal<70).  Had long discussion with patient regarding lifestyle changes.  He is very reluctant.  He would prefer medication rather than making the changes himself.  Informed pt he was on the maximum dose of medications and no other good option to help.  Offered pt referral to nutritionalist and he refused.  At this point, unsure if pt would gain any benefit from lipid clinic.  Will have him follow-up with his PCP from now on.  If pt requires further medication management, will be glad to see him again.

## 2010-11-24 ENCOUNTER — Telehealth: Payer: Self-pay | Admitting: Cardiology

## 2010-11-25 NOTE — Telephone Encounter (Signed)
No note required for this encounter.

## 2011-02-26 ENCOUNTER — Other Ambulatory Visit: Payer: Self-pay | Admitting: Nurse Practitioner

## 2011-04-05 ENCOUNTER — Ambulatory Visit (INDEPENDENT_AMBULATORY_CARE_PROVIDER_SITE_OTHER): Payer: Medicare Other | Admitting: Nurse Practitioner

## 2011-04-05 ENCOUNTER — Encounter: Payer: Self-pay | Admitting: Nurse Practitioner

## 2011-04-05 VITALS — BP 130/78 | HR 82 | Ht 69.0 in | Wt 258.8 lb

## 2011-04-05 DIAGNOSIS — Z9189 Other specified personal risk factors, not elsewhere classified: Secondary | ICD-10-CM

## 2011-04-05 DIAGNOSIS — I10 Essential (primary) hypertension: Secondary | ICD-10-CM

## 2011-04-05 DIAGNOSIS — E7800 Pure hypercholesterolemia, unspecified: Secondary | ICD-10-CM

## 2011-04-05 NOTE — Progress Notes (Signed)
Robert Walters Date of Birth: 12-19-1955 Medical Record #[id number]  History of Present Illness: Robert Walters is seen today for his 6 month check. He is seen for Dr. Elease Hashimoto. He is a former patient of Dr. Reyes Ivan and Dr. Deborah Chalk. He is doing ok. No chest pain or shortness of breath. Does not have any known CAD. Last nuclear in December 2011. Feels ok on his medicines. He is walking some. Still not really losing weight. Suffers from gastroparesis. He is tolerating his medicines.   Current Outpatient Prescriptions on File Prior to Visit  Medication Sig Dispense Refill  . ALPRAZolam (XANAX) 0.5 MG tablet Take 0.5 mg by mouth at bedtime as needed.        [name] aspirin 81 MG tablet Take 81 mg by mouth daily.        [name] atenolol (TENORMIN) 100 MG tablet Take 100 mg by mouth 2 (two) times daily.        . carbamazepine (TEGRETOL) 200 MG tablet Take 200 mg by mouth 2 (two) times daily.        [name] CINNAMON PO Take 1,000 mg by mouth daily.       [name] ezetimibe (ZETIA) 10 MG tablet Take 10 mg by mouth daily.        . fenofibrate 160 MG tablet Take 160 mg by mouth daily.        . Ferrous Sulfate (IRON) 325 (65 FE) MG TABS Take by mouth 2 (two) times daily.        [name] FINASTERIDE PO Take 5 mg by mouth daily.       . fish oil-omega-3 fatty acids 1000 MG capsule Take 6 g by mouth daily.        [name] glipiZIDE (GLUCOTROL) 10 MG tablet Take 10 mg by mouth 2 (two) times daily before a meal.        . lamoTRIgine (LAMICTAL) 25 MG tablet Take 25 mg by mouth 2 (two) times daily.        [name] losartan (COZAAR) 100 MG tablet TAKE 1 TABLET DAILY (NEED OFFICE VISIT SOON)  90 tablet  3  . MECLIZINE HCL PO Take by mouth 2 (two) times daily.        . metFORMIN (GLUCOPHAGE) 1000 MG tablet Take 1,000 mg by mouth 2 (two) times daily with a meal.        . Multiple Vitamin (MULTIVITAMIN) tablet Take 1 tablet by mouth daily.        [name] oxybutynin (DITROPAN) 5 MG tablet Take 5 mg by mouth 2 (two) times daily.       . pantoprazole (PROTONIX) 40 MG  tablet Take 40 mg by mouth 2 (two) times daily.       . Tamsulosin HCl (FLOMAX) 0.4 MG CAPS Take 0.4 mg by mouth daily.        [name] DISCONTD: cholecalciferol (VITAMIN D) 1000 UNITS tablet Take 1,000 Units by mouth daily.        [name] DISCONTD: rosuvastatin (CRESTOR) 40 MG tablet Take 40 mg by mouth daily.          No Known Allergies  Past Medical History  Diagnosis Date  . Diabetes mellitus   . Hypertension   . Hyperlipidemia   . Obesity   . Acid reflux disease   . Gastritis   . BPH (benign prostatic hypertrophy)   . Vertigo   . Normal nuclear stress test Dec 2011    Past Surgical History  Procedure Date  . Cardiovascular  stress test 2009    EF 63%    History  Smoking status  . Never Smoker   Smokeless tobacco  . Not on file    History  Alcohol Use No    Family History  Problem Relation Age of Onset  . Hyperlipidemia Father   . Colon cancer Father   . Stroke Mother     Review of Systems: The review of systems is per the HPI.  All other systems were reviewed and are negative.  Physical Exam: BP 130/78  Pulse 82  Ht 5\' 9"  (1.753 m)  Wt 258 lb 12.8 oz (117.391 kg)  BMI 38.22 kg/m2 Patient is very pleasant and in no acute distress. Skin is warm and dry. Color is normal.  HEENT is unremarkable. Normocephalic/atraumatic. PERRL. Sclera are nonicteric. Neck is supple. No masses. No JVD. Lungs are clear. Cardiac exam shows a regular rate and rhythm. Abdomen is obese but soft. Extremities are without edema. Gait and ROM are intact. No gross neurologic deficits noted.   LABORATORY DATA:   Assessment / Plan:

## 2011-04-05 NOTE — Assessment & Plan Note (Signed)
Labs are checked by his PCP.  

## 2011-04-05 NOTE — Assessment & Plan Note (Signed)
Has multiple risk factors. Had his last stress test a year ago. I have tried to encourage him to work harder on his diet and weight loss. Not sure he has the motivation to follow through.

## 2011-04-05 NOTE — Patient Instructions (Signed)
Stay on your current medicines  Regular exercise and weight loss is encouraged.  We will see you in 6 months.  Call the [facility] office at [phone] if you have any questions, problems or concerns.

## 2011-04-05 NOTE — Assessment & Plan Note (Signed)
Blood pressure is ok. No change in his current medicines.

## 2011-09-07 ENCOUNTER — Other Ambulatory Visit: Payer: Self-pay | Admitting: Gastroenterology

## 2011-09-07 DIAGNOSIS — R112 Nausea with vomiting, unspecified: Secondary | ICD-10-CM

## 2011-09-10 ENCOUNTER — Ambulatory Visit
Admission: RE | Admit: 2011-09-10 | Discharge: 2011-09-10 | Disposition: A | Discharge status: Home / Self Care | Payer: Medicare Other | Source: Ambulatory Visit | Attending: Gastroenterology | Admitting: Gastroenterology

## 2011-09-10 DIAGNOSIS — R112 Nausea with vomiting, unspecified: Secondary | ICD-10-CM

## 2011-10-05 ENCOUNTER — Encounter (HOSPITAL_COMMUNITY): Payer: Self-pay | Admitting: *Deleted

## 2011-10-05 ENCOUNTER — Emergency Department (HOSPITAL_COMMUNITY)
Admission: EM | Admit: 2011-10-05 | Discharge: 2011-10-05 | Disposition: A | Discharge status: Home / Self Care | Payer: Medicare Other | Attending: Emergency Medicine | Admitting: Emergency Medicine

## 2011-10-05 DIAGNOSIS — Z23 Encounter for immunization: Secondary | ICD-10-CM | POA: Insufficient documentation

## 2011-10-05 DIAGNOSIS — E119 Type 2 diabetes mellitus without complications: Secondary | ICD-10-CM | POA: Insufficient documentation

## 2011-10-05 DIAGNOSIS — S0180XA Unspecified open wound of other part of head, initial encounter: Secondary | ICD-10-CM | POA: Insufficient documentation

## 2011-10-05 DIAGNOSIS — E785 Hyperlipidemia, unspecified: Secondary | ICD-10-CM | POA: Insufficient documentation

## 2011-10-05 DIAGNOSIS — W278XXA Contact with other nonpowered hand tool, initial encounter: Secondary | ICD-10-CM | POA: Insufficient documentation

## 2011-10-05 DIAGNOSIS — IMO0002 Reserved for concepts with insufficient information to code with codable children: Secondary | ICD-10-CM

## 2011-10-05 DIAGNOSIS — Y92009 Unspecified place in unspecified non-institutional (private) residence as the place of occurrence of the external cause: Secondary | ICD-10-CM | POA: Insufficient documentation

## 2011-10-05 DIAGNOSIS — Y93E8 Activity, other personal hygiene: Secondary | ICD-10-CM | POA: Insufficient documentation

## 2011-10-05 DIAGNOSIS — K219 Gastro-esophageal reflux disease without esophagitis: Secondary | ICD-10-CM | POA: Insufficient documentation

## 2011-10-05 DIAGNOSIS — I10 Essential (primary) hypertension: Secondary | ICD-10-CM | POA: Insufficient documentation

## 2011-10-05 MED ORDER — DIPHTH-ACELL PERTUSSIS-TETANUS 25-58-10 LF-MCG/0.5 IM SUSP
0.5000 mL | Freq: Once | INTRAMUSCULAR | Status: AC
  Administered 2011-10-05: 0.5 mL via INTRAMUSCULAR
  Filled 2011-10-05: qty 0.5

## 2011-10-05 NOTE — ED Provider Notes (Signed)
Medical screening examination/treatment/procedure(s) were performed by non-physician practitioner and as supervising physician I was immediately available for consultation/collaboration.   Brentin Shin, MD 10/05/11 1924 

## 2011-10-05 NOTE — ED Notes (Signed)
Awaiting shot time.  nad noted.

## 2011-10-05 NOTE — ED Provider Notes (Signed)
History     CSN: [id number]  Arrival date & time 10/05/11  1714   First MD Initiated Contact with Patient 10/05/11 1755      Chief Complaint  Patient presents with  . Laceration    (Consider location/radiation/quality/duration/timing/severity/associated sxs/prior treatment) Patient is a 56 y.o. male presenting with skin laceration. The history is provided by the patient.  Laceration  The incident occurred 1 to 2 hours ago. The laceration is located on the face. The laceration is 1 cm in size. The laceration mechanism was a a razor. The pain is mild. The pain has been constant since onset. He reports no foreign bodies present. His tetanus status is out of date.    Past Medical History  Diagnosis Date  . Diabetes mellitus   . Hypertension   . Hyperlipidemia   . Obesity   . Acid reflux disease   . Gastritis   . BPH (benign prostatic hypertrophy)   . Vertigo   . Normal nuclear stress test Dec 2011    Past Surgical History  Procedure Date  . Cardiovascular stress test 2009    EF 63%    Family History  Problem Relation Age of Onset  . Hyperlipidemia Father   . Colon cancer Father   . Stroke Mother     History  Substance Use Topics  . Smoking status: Never Smoker   . Smokeless tobacco: Not on file  . Alcohol Use: No      Review of Systems  Constitutional: Negative for activity change.       All ROS Neg except as noted in HPI  HENT: Negative for nosebleeds and neck pain.   Eyes: Negative for photophobia and discharge.  Respiratory: Negative for cough, shortness of breath and wheezing.   Cardiovascular: Negative for chest pain and palpitations.  Gastrointestinal: Negative for abdominal pain and blood in stool.  Genitourinary: Negative for dysuria, frequency and hematuria.  Musculoskeletal: Negative for back pain and arthralgias.  Skin: Negative.   Neurological: Positive for dizziness. Negative for seizures and speech difficulty.  Psychiatric/Behavioral:  Negative for hallucinations and confusion.    Allergies  Review of patient's allergies indicates no known allergies.  Home Medications   Current Outpatient Rx  Name Route Sig Dispense Refill  . ALPRAZOLAM 0.5 MG PO TABS Oral Take 0.5 mg by mouth at bedtime as needed.      . ASPIRIN 81 MG PO TABS Oral Take 81 mg by mouth daily.      . ATENOLOL 100 MG PO TABS Oral Take 100 mg by mouth 2 (two) times daily.      [name] CARBAMAZEPINE 200 MG PO TABS Oral Take 200 mg by mouth 2 (two) times daily.      [name] VITAMIN D3 5000 UNITS PO TABS Oral Take by mouth daily.      [name] CINNAMON PO Oral Take 1,000 mg by mouth daily.     [name] EZETIMIBE 10 MG PO TABS Oral Take 10 mg by mouth daily.      . FENOFIBRATE 160 MG PO TABS Oral Take 160 mg by mouth daily.      . IRON 325 (65 FE) MG PO TABS Oral Take by mouth 2 (two) times daily.      [name] FINASTERIDE PO Oral Take 5 mg by mouth daily.     . OMEGA-3 FATTY ACIDS 1000 MG PO CAPS Oral Take 6 g by mouth daily.      [name] GLIPIZIDE 10 MG PO TABS Oral Take 10  mg by mouth 2 (two) times daily before a meal.      . LAMOTRIGINE 25 MG PO TABS Oral Take 25 mg by mouth 2 (two) times daily.      [name] LOSARTAN POTASSIUM 100 MG PO TABS  TAKE 1 TABLET DAILY (NEED OFFICE VISIT SOON) 90 tablet 3  . MECLIZINE HCL PO Oral Take by mouth 2 (two) times daily.      [name] METFORMIN HCL 1000 MG PO TABS Oral Take 1,000 mg by mouth 2 (two) times daily with a meal.      . ONE-DAILY MULTI VITAMINS PO TABS Oral Take 1 tablet by mouth daily.      . OXYBUTYNIN CHLORIDE 5 MG PO TABS Oral Take 5 mg by mouth 2 (two) times daily.     [name] PANTOPRAZOLE SODIUM 40 MG PO TBEC Oral Take 40 mg by mouth 2 (two) times daily.     [name] ROSUVASTATIN CALCIUM 20 MG PO TABS Oral Take 20 mg by mouth 2 (two) times daily.      [name] TAMSULOSIN HCL 0.4 MG PO CAPS Oral Take 0.4 mg by mouth daily.        BP 136/86  Pulse 79  Temp(Src) 98.3 F (36.8 C) (Oral)  Resp 16  Ht 5\' 9"  (1.753 m)  Wt 250 lb (113.399 kg)  BMI 36.92 kg/m2  SpO2  95%  Physical Exam  Nursing note and vitals reviewed. Constitutional: He is oriented to person, place, and time. He appears well-developed and well-nourished.  Non-toxic appearance.  HENT:  Head: Normocephalic.  Right Ear: Tympanic membrane and external ear normal.  Left Ear: Tympanic membrane and external ear normal.       A shallow laceration of the chin with active bleeding.  Eyes: EOM and lids are normal. Pupils are equal, round, and reactive to light.  Neck: Normal range of motion. Neck supple. Carotid bruit is not present.  Cardiovascular: Normal rate, regular rhythm, normal heart sounds, intact distal pulses and normal pulses.   Pulmonary/Chest: Breath sounds normal. No respiratory distress.  Abdominal: Soft. Bowel sounds are normal. There is no tenderness. There is no guarding.  Musculoskeletal: Normal range of motion.  Lymphadenopathy:       Head (right side): No submandibular adenopathy present.       Head (left side): No submandibular adenopathy present.    He has no cervical adenopathy.  Neurological: He is alert and oriented to person, place, and time. He has normal strength. No cranial nerve deficit or sensory deficit.  Skin: Skin is warm and dry.  Psychiatric: He has a normal mood and affect. His speech is normal.    ED Course  Procedures: LACERATION REPAIR - the patient was identified by arm band. Permission for the procedure was given by the patient. Time out was taken before proceeding. The patient has a shallow laceration of the chin. This laceration is not a candidate for suture repair. Surgicel was applied for bleeding control. Once this was obtained a sterile dressing was applied. The patient was observed for 15 minutes without recurrence of bleeding. Patient tolerated the procedure without problem.  Labs Reviewed - No data to display No results found. Pulse oximetry 95% on room air. Within normal limits by my interpretation.  1. Laceration       MDM  I  have reviewed nursing notes, vital signs, and all appropriate lab and imaging results for this patient. Patient has a shallow laceration of the chin this was repaired with Surgicel and  sterile dressing. The patient is advised to return if bleeding returns or any signs of infection. He was out of date for his tetanus and this was updated today.       Kathie Dike, [place] 10/05/11 1820

## 2011-10-05 NOTE — ED Notes (Signed)
Laceration to chin from shaving.  Reports uncontrollable bleeding.

## 2011-10-05 NOTE — Discharge Instructions (Signed)
Please leave the surgi-cell in place until October 05, 2011. Please return if not improving. Please note your records, that you received a  Tetanus shot today.

## 2011-10-07 ENCOUNTER — Encounter: Payer: Medicare Other | Admitting: Cardiovascular Disease

## 2011-10-15 ENCOUNTER — Encounter (HOSPITAL_COMMUNITY): Payer: Self-pay

## 2011-10-15 ENCOUNTER — Emergency Department (HOSPITAL_COMMUNITY): Payer: Medicare Other

## 2011-10-15 ENCOUNTER — Emergency Department (HOSPITAL_COMMUNITY)
Admission: EM | Admit: 2011-10-15 | Discharge: 2011-10-15 | Disposition: A | Discharge status: Home / Self Care | Payer: Medicare Other | Attending: Emergency Medicine | Admitting: Emergency Medicine

## 2011-10-15 DIAGNOSIS — R112 Nausea with vomiting, unspecified: Secondary | ICD-10-CM | POA: Insufficient documentation

## 2011-10-15 DIAGNOSIS — Z79899 Other long term (current) drug therapy: Secondary | ICD-10-CM | POA: Insufficient documentation

## 2011-10-15 DIAGNOSIS — R270 Ataxia, unspecified: Secondary | ICD-10-CM

## 2011-10-15 DIAGNOSIS — I10 Essential (primary) hypertension: Secondary | ICD-10-CM | POA: Insufficient documentation

## 2011-10-15 DIAGNOSIS — N200 Calculus of kidney: Secondary | ICD-10-CM | POA: Insufficient documentation

## 2011-10-15 DIAGNOSIS — E119 Type 2 diabetes mellitus without complications: Secondary | ICD-10-CM | POA: Insufficient documentation

## 2011-10-15 DIAGNOSIS — R42 Dizziness and giddiness: Secondary | ICD-10-CM | POA: Insufficient documentation

## 2011-10-15 DIAGNOSIS — R109 Unspecified abdominal pain: Secondary | ICD-10-CM

## 2011-10-15 LAB — COMPREHENSIVE METABOLIC PANEL
ALT: 28 U/L (ref 0–53)
Albumin: 4 g/dL (ref 3.5–5.2)
Alkaline Phosphatase: 70 U/L (ref 39–117)
BUN: 30 mg/dL — ABNORMAL HIGH (ref 6–23)
Chloride: 96 mEq/L (ref 96–112)
Potassium: 5.1 mEq/L (ref 3.5–5.1)
Sodium: 136 mEq/L (ref 135–145)
Total Bilirubin: 0.5 mg/dL (ref 0.3–1.2)

## 2011-10-15 LAB — CBC
Hemoglobin: 11.9 g/dL — ABNORMAL LOW (ref 13.0–17.0)
MCHC: 35.4 g/dL (ref 30.0–36.0)
Platelets: 158 10*3/uL (ref 150–400)
RBC: 3.64 MIL/uL — ABNORMAL LOW (ref 4.22–5.81)

## 2011-10-15 LAB — DIFFERENTIAL
Basophils Relative: 0 % (ref 0–1)
Lymphs Abs: 1.2 10*3/uL (ref 0.7–4.0)
Monocytes Relative: 8 % (ref 3–12)
Neutro Abs: 4.8 10*3/uL (ref 1.7–7.7)
Neutrophils Relative %: 72 % (ref 43–77)

## 2011-10-15 LAB — TROPONIN I: Troponin I: <0.3 ng/mL (ref <0.30)

## 2011-10-15 MED ORDER — ONDANSETRON 8 MG PO TBDP: 8.0000 mg | ORAL_TABLET | Freq: Three times a day (TID) | ORAL | Status: AC | PRN

## 2011-10-15 MED ORDER — ONDANSETRON HCL 4 MG/2ML IJ SOLN
4.0000 mg | Freq: Once | INTRAMUSCULAR | Status: AC
  Administered 2011-10-15: 4 mg via INTRAVENOUS
  Filled 2011-10-15: qty 2

## 2011-10-15 MED ORDER — SODIUM CHLORIDE 0.9 % IV SOLN: INTRAVENOUS | Status: DC

## 2011-10-15 MED ORDER — IOHEXOL 300 MG/ML  SOLN
100.0000 mL | Freq: Once | INTRAMUSCULAR | Status: AC | PRN
  Administered 2011-10-15: 100 mL via INTRAVENOUS

## 2011-10-15 MED ORDER — SODIUM CHLORIDE 0.9 % IV BOLUS (SEPSIS)
250.0000 mL | Freq: Once | INTRAVENOUS | Status: AC
  Administered 2011-10-15: 250 mL via INTRAVENOUS

## 2011-10-15 NOTE — Discharge Instructions (Signed)
Benign Positional Vertigo Vertigo means you feel like you or your surroundings are moving when they are not. Benign positional vertigo is the most common form of vertigo. Benign means that the cause of your condition is not serious. Benign positional vertigo is more common in older adults. CAUSES  Benign positional vertigo is the result of an upset in the labyrinth system. This is an area in the middle ear that helps control your balance. This may be caused by a viral infection, head injury, or repetitive motion. However, often no specific cause is found. SYMPTOMS  Symptoms of benign positional vertigo occur when you move your head or eyes in different directions. Some of the symptoms may include:  Loss of balance and falls.   Vomiting.   Blurred vision.   Dizziness.   Nausea.   Involuntary eye movements (nystagmus).  DIAGNOSIS  Benign positional vertigo is usually diagnosed by physical exam. If the specific cause of your benign positional vertigo is unknown, your caregiver may perform imaging tests, such as magnetic resonance imaging (MRI) or computed tomography (CT). TREATMENT  Your caregiver may recommend movements or procedures to correct the benign positional vertigo. Medicines such as meclizine, benzodiazepines, and medicines for nausea may be used to treat your symptoms. In rare cases, if your symptoms are caused by certain conditions that affect the inner ear, you may need surgery. HOME CARE INSTRUCTIONS   Follow your caregiver's instructions.   Move slowly. Do not make sudden body or head movements.   Avoid driving.   Avoid operating heavy machinery.   Avoid performing any tasks that would be dangerous to you or others during a vertigo episode.   Drink enough fluids to keep your urine clear or pale yellow.  SEEK IMMEDIATE MEDICAL CARE IF:   You develop problems with walking, weakness, numbness, or using your arms, hands, or legs.   You have difficulty speaking.   You  develop severe headaches.   Your nausea or vomiting continues or gets worse.   You develop visual changes.   Your family or friends notice any behavioral changes.   Your condition gets worse.   You have a fever.   You develop a stiff neck or sensitivity to light.  MAKE SURE YOU:   Understand these instructions.   Will watch your condition.   Will get help right away if you are not doing well or get worse.  Document Released: 01/25/2006 Document Revised: 04/08/2011 Document Reviewed: 01/07/2011 [facility] Patient Information 2012 [place], [place].  Continue Antivert as needed supplement with Zofran for nausea and vomiting. Return for new or worse symptoms. Followup with your regular doctor to continue workup for the vertigo they've been doing it he didn't may need MRI of brain if not done already.

## 2011-10-15 NOTE — ED Notes (Signed)
Pt c/o blurry vision, and neck and shoulder pain from a fall 2 months ago, told RN, with Dr Herma Carson now

## 2011-10-15 NOTE — ED Notes (Signed)
Patient not in the room at this time  

## 2011-10-15 NOTE — ED Provider Notes (Addendum)
History     CSN: [id number]  Arrival date & time 10/15/11  1328   First MD Initiated Contact with Patient 10/15/11 1355      Chief Complaint  Patient presents with  . Dizziness    (Consider location/radiation/quality/duration/timing/severity/associated sxs/prior treatment) The history is provided by the patient.  patient is a 56 year old male with a history of diabetes followed by Dr. Ethelene Browns for primary care Dr. Patient presents with several complaints the main one is some mild dizziness with some mild vertigo nausea and vomiting for the past 2-3 days also noted the some blurred vision but no visual black spots or peripheral vision loss. Some mild abdominal pain from the vomiting yesterday vomited about 1520 times. No chest pain no headache no weakness in the upper extremities or lower trimming is no numbness or tingling. No back pain. Patient has had a history of some mild vertigo in the past he has been on Antivert for that in the past. Denies fever shortness of breath facial weakness speech problems neck pain.  Past Medical History  Diagnosis Date  . Diabetes mellitus   . Hypertension   . Hyperlipidemia   . Obesity   . Acid reflux disease   . Gastritis   . BPH (benign prostatic hypertrophy)   . Vertigo   . Normal nuclear stress test Dec 2011    Past Surgical History  Procedure Date  . Cardiovascular stress test 2009    EF 63%    Family History  Problem Relation Age of Onset  . Hyperlipidemia Father   . Colon cancer Father   . Stroke Mother     History  Substance Use Topics  . Smoking status: Never Smoker   . Smokeless tobacco: Not on file  . Alcohol Use: No      Review of Systems  Constitutional: Negative for fever and chills.  HENT: Negative for hearing loss, ear pain, congestion and neck pain.   Eyes: Positive for visual disturbance. Negative for pain.  Respiratory: Negative for chest tightness and shortness of breath.   Cardiovascular: Negative for  chest pain.  Gastrointestinal: Positive for nausea, vomiting and abdominal pain. Negative for diarrhea.  Genitourinary: Negative for dysuria.  Musculoskeletal: Negative for back pain.  Skin: Negative for rash.  Neurological: Positive for dizziness. Negative for tremors, seizures, syncope, facial asymmetry, speech difficulty, weakness, light-headedness, numbness and headaches.  Hematological: Does not bruise/bleed easily.    Allergies  Review of patient's allergies indicates no known allergies.  Home Medications   Current Outpatient Rx  Name Route Sig Dispense Refill  . ALPRAZOLAM 0.5 MG PO TABS Oral Take 0.5 mg by mouth 3 (three) times daily as needed.     . ASPIRIN 81 MG PO TABS Oral Take 81 mg by mouth daily.      . ATENOLOL 100 MG PO TABS Oral Take 100 mg by mouth 2 (two) times daily.      [name] CARBAMAZEPINE 200 MG PO TABS Oral Take 200 mg by mouth daily.     [name] VITAMIN D3 5000 UNITS PO TABS Oral Take by mouth daily.      [name] CINNAMON PO Oral Take 1,000 mg by mouth daily.     [name] EZETIMIBE 10 MG PO TABS Oral Take 10 mg by mouth daily.      . IRON 325 (65 FE) MG PO TABS Oral Take by mouth 2 (two) times daily.      [name] FINASTERIDE PO Oral Take 5 mg by mouth daily.     [name]  OMEGA-3 FATTY ACIDS 1000 MG PO CAPS Oral Take 6 g by mouth daily.      [name] GARLIC PO Oral Take 1 capsule by mouth daily.    [name] GLIPIZIDE 10 MG PO TABS Oral Take 10 mg by mouth 2 (two) times daily before a meal.      . LAMOTRIGINE 25 MG PO TABS Oral Take 25 mg by mouth 2 (two) times daily.      [name] LOSARTAN POTASSIUM 100 MG PO TABS Oral Take 100 mg by mouth daily.    [name] METFORMIN HCL 1000 MG PO TABS Oral Take 1,000 mg by mouth 2 (two) times daily with a meal.      . ONE-DAILY MULTI VITAMINS PO TABS Oral Take 1 tablet by mouth daily.      . OXYBUTYNIN CHLORIDE 5 MG PO TABS Oral Take 5 mg by mouth 2 (two) times daily.     [name] PANTOPRAZOLE SODIUM 40 MG PO TBEC Oral Take 40 mg by mouth 2 (two) times daily.     [name] ROSUVASTATIN CALCIUM 20 MG  PO TABS Oral Take 40 mg by mouth daily.     [name] TAMSULOSIN HCL 0.4 MG PO CAPS Oral Take 0.4 mg by mouth daily.      [name] ONDANSETRON 8 MG PO TBDP Oral Take 1 tablet (8 mg total) by mouth every 8 (eight) hours as needed for nausea. 10 tablet 0    BP 134/70  Pulse 82  Temp 97.8 F (36.6 C) (Oral)  Resp 16  Ht 5\' 9"  (1.753 m)  Wt 248 lb (112.492 kg)  BMI 36.62 kg/m2  SpO2 98%  Physical Exam  Nursing note and vitals reviewed. Constitutional: He is oriented to person, place, and time. He appears well-developed and well-nourished. No distress.  HENT:  Head: Normocephalic and atraumatic.  Mouth/Throat: Oropharynx is clear and moist.  Eyes: Conjunctivae and EOM are normal. Pupils are equal, round, and reactive to light.  Neck: Normal range of motion. Neck supple.  Cardiovascular: Normal rate, regular rhythm and normal heart sounds.   No murmur heard. Pulmonary/Chest: Effort normal and breath sounds normal.  Abdominal: Soft. Bowel sounds are normal. There is no tenderness.  Musculoskeletal: Normal range of motion. He exhibits no edema and no tenderness.  Neurological: He is alert and oriented to person, place, and time. No cranial nerve deficit. He exhibits normal muscle tone. Coordination normal.  Skin: Skin is warm. No rash noted.    ED Course  Procedures (including critical care time)  Labs Reviewed  CBC - Abnormal; Notable for the following:    RBC 3.64 (*)     Hemoglobin 11.9 (*)     HCT 33.6 (*)     All other components within normal limits  COMPREHENSIVE METABOLIC PANEL - Abnormal; Notable for the following:    Glucose, Bld 231 (*)     BUN 30 (*)     Creatinine, Ser 1.56 (*)     GFR calc non Af Amer 48 (*)     GFR calc Af Amer 55 (*)     All other components within normal limits  GLUCOSE, CAPILLARY - Abnormal; Notable for the following:    Glucose-Capillary 216 (*)     All other components within normal limits  DIFFERENTIAL  TROPONIN I  LIPASE, BLOOD  URINALYSIS,  ROUTINE W REFLEX MICROSCOPIC   Dg Chest 2 View  10/15/2011  *RADIOLOGY REPORT*  Clinical Data: Dizziness with hypertension and vertigo.  CHEST - 2 VIEW  Comparison:  Abdominal CT 10/15/2011.  Findings: The heart is mildly enlarged.  The mediastinal contours are normal.  The lungs are clear.  There is no pleural effusion or pneumothorax.  No suspicious osseous findings are seen.  IMPRESSION: Mild cardiomegaly.  No acute cardiopulmonary process.  Original Report Authenticated By: Gerrianne Scale, M.D.   Ct Head Wo Contrast  10/15/2011  *RADIOLOGY REPORT*  Clinical Data: Dizziness, nausea, vomiting, history diabetes, hypertension  CT HEAD WITHOUT CONTRAST  Technique:  Contiguous axial images were obtained from the base of the skull through the vertex without contrast.  Comparison: 03/29/2009  Findings: Mild atrophy. Normal ventricular morphology. No midline shift or mass effect. Streak artifacts from skull base. No intracranial hemorrhage, mass lesion, or evidence of acute infarction. No extra-axial fluid collections. Visualized paranasal sinuses mastoid air cells clear. No acute osseous findings.  IMPRESSION: No acute intracranial abnormalities.  Original Report Authenticated By: Lollie Marrow, M.D.   Ct Abdomen Pelvis W Contrast  10/15/2011  *RADIOLOGY REPORT*  Clinical Data: Dizziness and nausea.  CT ABDOMEN AND PELVIS WITH CONTRAST  Technique:  Multidetector CT imaging of the abdomen and pelvis was performed following the standard protocol during bolus administration of intravenous contrast.  Contrast: OMNIPAQUE IOHEXOL 300 MG/ML  SOLN  Comparison: CT of abdomen and pelvis 06/25/2010.  Findings:  Lung Bases: Unremarkable.  Abdomen/Pelvis:  The enhanced appearance of the liver, gallbladder, pancreas, spleen and bilateral adrenal glands is unremarkable.  In the lower pole collecting system of the left kidney there is a 3 mm calculus which is nonobstructive.  Mild bilateral perinephric stranding is  noted, which is nonspecific and can be seen in the setting of chronic renal insufficiency.  There is a subcentimeter low attenuation lesion in the lower pole of the left kidney which is too small to definitively characterize, but is statistically favored to represent a small cyst.  There is no ascites or pneumoperitoneum and no pathologic distension of bowel.  No definite pathologic lymphadenopathy identified within the abdomen or pelvis.  The appendix is normal. Mild atherosclerosis throughout the abdominal and pelvic vasculature is noted, without frank aneurysm or dissection. Prostate and urinary bladder are unremarkable.  Musculoskeletal: There are no aggressive appearing lytic or blastic lesions noted in the visualized portions of the skeleton.  IMPRESSION: 1.  No acute findings in the abdomen or pelvis to account for the patient's symptoms. 2.  3 mm nonobstructive calculus in the lower pole collecting system of the left kidney incidentally noted. 3.  Mild atherosclerosis.  Original Report Authenticated By: Florencia Reasons, M.D.   Results for orders placed during the hospital encounter of 10/15/11  CBC      Component Value Range   WBC 6.8  4.0 - 10.5 K/uL   RBC 3.64 (*) 4.22 - 5.81 MIL/uL   Hemoglobin 11.9 (*) 13.0 - 17.0 g/dL   HCT 01.0 (*) 27.2 - 53.6 %   MCV 92.3  78.0 - 100.0 fL   MCH 32.7  26.0 - 34.0 pg   MCHC 35.4  30.0 - 36.0 g/dL   RDW 64.4  03.4 - 74.2 %   Platelets 158  150 - 400 K/uL  DIFFERENTIAL      Component Value Range   Neutrophils Relative 72  43 - 77 %   Neutro Abs 4.8  1.7 - 7.7 K/uL   Lymphocytes Relative 17  12 - 46 %   Lymphs Abs 1.2  0.7 - 4.0 K/uL   Monocytes Relative 8  3 -  12 %   Monocytes Absolute 0.5  0.1 - 1.0 K/uL   Eosinophils Relative 4  0 - 5 %   Eosinophils Absolute 0.3  0.0 - 0.7 K/uL   Basophils Relative 0  0 - 1 %   Basophils Absolute 0.0  0.0 - 0.1 K/uL  COMPREHENSIVE METABOLIC PANEL      Component Value Range   Sodium 136  135 - 145 mEq/L    Potassium 5.1  3.5 - 5.1 mEq/L   Chloride 96  96 - 112 mEq/L   CO2 23  19 - 32 mEq/L   Glucose, Bld 231 (*) 70 - 99 mg/dL   BUN 30 (*) 6 - 23 mg/dL   Creatinine, Ser 1.19 (*) 0.50 - 1.35 mg/dL   Calcium 14.7  8.4 - 82.9 mg/dL   Total Protein 7.4  6.0 - 8.3 g/dL   Albumin 4.0  3.5 - 5.2 g/dL   AST 27  0 - 37 U/L   ALT 28  0 - 53 U/L   Alkaline Phosphatase 70  39 - 117 U/L   Total Bilirubin 0.5  0.3 - 1.2 mg/dL   GFR calc non Af Amer 48 (*) >90 mL/min   GFR calc Af Amer 55 (*) >90 mL/min  TROPONIN I      Component Value Range   Troponin I <0.30  <0.30 ng/mL  LIPASE, BLOOD      Component Value Range   Lipase 31  11 - 59 U/L  GLUCOSE, CAPILLARY      Component Value Range   Glucose-Capillary 216 (*) 70 - 99 mg/dL    Date: 56/21/3086  Rate: 82  Rhythm: normal sinus rhythm  QRS Axis: normal  Intervals: normal  ST/T Wave abnormalities: nonspecific ST/T changes  Conduction Disutrbances:right bundle branch block  Narrative Interpretation:   Old EKG Reviewed: none available Right bundle branch block is incomplete.   1. Dizziness   2. Abdominal pain   3. Vertigo       MDM   Patient's workup in the emergency apartment negative cardiac markers negative for silent MI chest x-ray without evidence of pneumonia pneumothorax. CT of brain without any acute changes. CT of abdomen without any acute or significant findings there is a nonobstructive calculus in the lower pole of the click and system of the left kidney. Patient is a known diabetic blood sugars here around 231 no acidosis. After completing this workup it seems that symptoms may be consistent with dizziness or mild vertigo turns out the patient is oriented an extensive workup for vertigo ongoing in [place] which his primary care doctor started. Sounds as if patient has been seen by ear nose and throat and neurology already. Sounds also has not had an MRI of the brain that may be planned if not they may want to think about that.  Patient will return for any new or worse symptoms. Blurred vision specific cause of that it is bilateral it is not clear at this point in time. Not concerned with acute stroke symptoms at this point in time.        Shelda Jakes, MD 10/15/11 1649  Shelda Jakes, MD 10/15/11 [phone]

## 2011-10-15 NOTE — ED Notes (Signed)
Pt complain of dizziness and nausea. States he had vertiago a couple of years ago. Is currently being tx for ear problems by an ENT

## 2011-11-17 ENCOUNTER — Ambulatory Visit (INDEPENDENT_AMBULATORY_CARE_PROVIDER_SITE_OTHER): Payer: Medicare Other | Admitting: Cardiovascular Disease

## 2011-11-17 ENCOUNTER — Encounter: Payer: Self-pay | Admitting: Cardiovascular Disease

## 2011-11-17 VITALS — BP 145/85 | HR 70 | Ht 69.0 in | Wt 246.0 lb

## 2011-11-17 DIAGNOSIS — I10 Essential (primary) hypertension: Secondary | ICD-10-CM

## 2011-11-17 NOTE — Progress Notes (Signed)
Robert Walters Date of Birth  1956-03-31       [facility] Office [address], [address]  [address] [place], [place]  [address]   [place], [place]  [address] [phone]     [phone]   Fax  [phone]    Fax [phone]  Problem List: 1. Hypertension 2. Hyperlipidemia 3. Diabetes mellitus - with gastroparesis 5. Obesity 6. GERD  History of Present Illness:  Robert Walters is a 56 yo with the above noted medical history.  He is on disability now for stomach issues.  He's done well from a cardiac standpoint. He's not had any episodes of chest pain or shortness breath. He's been walking and has lost 13 pounds recently.  Current Outpatient Prescriptions on File Prior to Visit  Medication Sig Dispense Refill  . ALPRAZolam (XANAX) 0.5 MG tablet Take 0.5 mg by mouth 3 (three) times daily as needed.       [name] aspirin 81 MG tablet Take 81 mg by mouth daily.        [name] atenolol (TENORMIN) 100 MG tablet Take 100 mg by mouth 2 (two) times daily.        . carbamazepine (TEGRETOL) 200 MG tablet Take 200 mg by mouth daily.       . Cholecalciferol (VITAMIN D3) 5000 UNITS TABS Take by mouth daily.        [name] CINNAMON PO Take 1,000 mg by mouth daily.       [name] ezetimibe (ZETIA) 10 MG tablet Take 10 mg by mouth daily.        . Ferrous Sulfate (IRON) 325 (65 FE) MG TABS Take by mouth 2 (two) times daily.        [name] FINASTERIDE PO Take 5 mg by mouth daily.       . fish oil-omega-3 fatty acids 1000 MG capsule Take 6 g by mouth daily.        [name] GARLIC PO Take 1 capsule by mouth daily.      [name] glipiZIDE (GLUCOTROL) 10 MG tablet Take 10 mg by mouth 2 (two) times daily before a meal.        . lamoTRIgine (LAMICTAL) 25 MG tablet Take 25 mg by mouth 2 (two) times daily.        [name] losartan (COZAAR) 100 MG tablet Take 100 mg by mouth daily.      . metFORMIN (GLUCOPHAGE) 1000 MG tablet Take 1,000 mg by mouth 2 (two) times daily with a meal.        . Multiple Vitamin  (MULTIVITAMIN) tablet Take 1 tablet by mouth daily.        [name] oxybutynin (DITROPAN) 5 MG tablet Take 5 mg by mouth 2 (two) times daily.       . pantoprazole (PROTONIX) 40 MG tablet Take 40 mg by mouth 2 (two) times daily.       . rosuvastatin (CRESTOR) 20 MG tablet Take 40 mg by mouth daily.       . Tamsulosin HCl (FLOMAX) 0.4 MG CAPS Take 0.4 mg by mouth daily.          No Known Allergies  Past Medical History  Diagnosis Date  . Diabetes mellitus   . Hypertension   . Hyperlipidemia   . Obesity   . Acid reflux disease   . Gastritis   . BPH (benign prostatic hypertrophy)   . Vertigo   . Normal nuclear stress test Dec 2011  Past Surgical History  Procedure Date  . Cardiovascular stress test 2009    EF 63%    History  Smoking status  . Never Smoker   Smokeless tobacco  . Not on file    History  Alcohol Use No    Family History  Problem Relation Age of Onset  . Hyperlipidemia Father   . Colon cancer Father   . Stroke Mother     Reviw of Systems:  Reviewed in the HPI.  All other systems are negative.  Physical Exam: Blood pressure 145/85, pulse 70, height 5\' 9"  (1.753 m), weight 246 lb (111.585 kg). General: Well developed, well nourished, in no acute distress.  Head: Normocephalic, atraumatic, sclera non-icteric, mucus membranes are moist,   Neck: Supple. Carotids are 2 + without bruits. No JVD  Lungs: Clear bilaterally to auscultation.  Heart: regular rate.  normal  S1 S2. No murmurs, gallops or rubs.  Abdomen: Soft, non-tender, non-distended with normal bowel sounds. No hepatomegaly. No rebound/guarding. No masses.  Msk:  Strength and tone are normal  Extremities: No clubbing or cyanosis. No edema.  Distal pedal pulses are 2+ and equal bilaterally.  Neuro: Alert and oriented X 3. Moves all extremities spontaneously.  Psych:  Responds to questions appropriately with a normal affect.  ECG: 11/17/2011 NSR at 70. Inc. RBBB  Assessment / Plan:

## 2011-11-17 NOTE — Patient Instructions (Addendum)
Your physician wants you to follow-up in: 1 year or sooner if needed, You will receive a reminder letter in the mail two months in advance. If you don't receive a letter, please call our office to schedule the follow-up appointment.   Your physician recommends that you return for a FASTING lipid profile: 1 year

## 2011-11-17 NOTE — Assessment & Plan Note (Signed)
Robert Walters is doing pretty well. He's not having episodes of chest pain or shortness breath. We will continue the same medications. I've encouraged him to work on a good diet and exercise program.

## 2012-01-24 ENCOUNTER — Other Ambulatory Visit: Payer: Self-pay | Admitting: *Deleted

## 2012-01-24 MED ORDER — LOSARTAN POTASSIUM 100 MG PO TABS: 100.0000 mg | ORAL_TABLET | Freq: Every day | ORAL | Status: DC

## 2012-01-25 ENCOUNTER — Other Ambulatory Visit: Payer: Self-pay | Admitting: *Deleted

## 2012-01-25 MED ORDER — LOSARTAN POTASSIUM 100 MG PO TABS: 100.0000 mg | ORAL_TABLET | Freq: Every day | ORAL | Status: DC

## 2012-01-25 NOTE — Telephone Encounter (Signed)
Fax Received. Refill Completed. Robert Walters (R.M.A)   

## 2012-01-26 ENCOUNTER — Telehealth: Payer: Self-pay | Admitting: Cardiovascular Disease

## 2012-01-26 NOTE — Telephone Encounter (Signed)
New Problem:    Patient called in needing a refill losartan (COZAAR) 100 MG tablet filled with [facility] (phone#[phone]).  This is the new permanent pharmacy.

## 2012-01-27 MED ORDER — LOSARTAN POTASSIUM 100 MG PO TABS: 100.0000 mg | ORAL_TABLET | Freq: Every day | ORAL | Status: DC

## 2012-01-27 NOTE — Telephone Encounter (Signed)
Fax Received. Refill Completed. Robert Walters (R.M.A)   

## 2012-05-15 ENCOUNTER — Ambulatory Visit (HOSPITAL_COMMUNITY): Payer: Medicare Other

## 2012-05-15 ENCOUNTER — Observation Stay (HOSPITAL_COMMUNITY): Payer: Medicare Other

## 2012-05-15 ENCOUNTER — Emergency Department (HOSPITAL_COMMUNITY): Payer: Medicare Other

## 2012-05-15 ENCOUNTER — Encounter (HOSPITAL_COMMUNITY): Payer: Self-pay | Admitting: Emergency Medicine

## 2012-05-15 ENCOUNTER — Observation Stay (HOSPITAL_COMMUNITY)
Admission: EM | Admit: 2012-05-15 | Discharge: 2012-05-16 | Disposition: A | Discharge status: Home / Self Care | Payer: Medicare Other | Attending: Family Medicine | Admitting: Family Medicine

## 2012-05-15 DIAGNOSIS — N183 Chronic kidney disease, stage 3 unspecified: Secondary | ICD-10-CM | POA: Diagnosis present

## 2012-05-15 DIAGNOSIS — R270 Ataxia, unspecified: Secondary | ICD-10-CM

## 2012-05-15 DIAGNOSIS — E785 Hyperlipidemia, unspecified: Secondary | ICD-10-CM | POA: Insufficient documentation

## 2012-05-15 DIAGNOSIS — Z9189 Other specified personal risk factors, not elsewhere classified: Secondary | ICD-10-CM

## 2012-05-15 DIAGNOSIS — E118 Type 2 diabetes mellitus with unspecified complications: Secondary | ICD-10-CM

## 2012-05-15 DIAGNOSIS — G459 Transient cerebral ischemic attack, unspecified: Principal | ICD-10-CM

## 2012-05-15 DIAGNOSIS — H538 Other visual disturbances: Secondary | ICD-10-CM

## 2012-05-15 DIAGNOSIS — I059 Rheumatic mitral valve disease, unspecified: Secondary | ICD-10-CM

## 2012-05-15 DIAGNOSIS — R279 Unspecified lack of coordination: Secondary | ICD-10-CM

## 2012-05-15 DIAGNOSIS — R110 Nausea: Secondary | ICD-10-CM | POA: Insufficient documentation

## 2012-05-15 DIAGNOSIS — F319 Bipolar disorder, unspecified: Secondary | ICD-10-CM | POA: Diagnosis present

## 2012-05-15 DIAGNOSIS — E1169 Type 2 diabetes mellitus with other specified complication: Secondary | ICD-10-CM | POA: Insufficient documentation

## 2012-05-15 DIAGNOSIS — E669 Obesity, unspecified: Secondary | ICD-10-CM | POA: Insufficient documentation

## 2012-05-15 DIAGNOSIS — R262 Difficulty in walking, not elsewhere classified: Secondary | ICD-10-CM | POA: Insufficient documentation

## 2012-05-15 DIAGNOSIS — I10 Essential (primary) hypertension: Secondary | ICD-10-CM | POA: Insufficient documentation

## 2012-05-15 DIAGNOSIS — E7800 Pure hypercholesterolemia, unspecified: Secondary | ICD-10-CM | POA: Insufficient documentation

## 2012-05-15 DIAGNOSIS — Z23 Encounter for immunization: Secondary | ICD-10-CM | POA: Insufficient documentation

## 2012-05-15 DIAGNOSIS — K219 Gastro-esophageal reflux disease without esophagitis: Secondary | ICD-10-CM | POA: Diagnosis present

## 2012-05-15 DIAGNOSIS — R42 Dizziness and giddiness: Secondary | ICD-10-CM

## 2012-05-15 DIAGNOSIS — N400 Enlarged prostate without lower urinary tract symptoms: Secondary | ICD-10-CM | POA: Diagnosis present

## 2012-05-15 DIAGNOSIS — E661 Drug-induced obesity: Secondary | ICD-10-CM | POA: Diagnosis present

## 2012-05-15 DIAGNOSIS — E162 Hypoglycemia, unspecified: Secondary | ICD-10-CM

## 2012-05-15 HISTORY — DX: Type 2 diabetes mellitus without complications: E11.9

## 2012-05-15 HISTORY — DX: Transient cerebral ischemic attack, unspecified: G45.9

## 2012-05-15 HISTORY — DX: Personal history of other diseases of the digestive system: Z87.19

## 2012-05-15 LAB — GLUCOSE, CAPILLARY
Glucose-Capillary: 160 mg/dL — ABNORMAL HIGH (ref 70–99)
Glucose-Capillary: 65 mg/dL — ABNORMAL LOW (ref 70–99)
Glucose-Capillary: 129 mg/dL — ABNORMAL HIGH (ref 70–99)

## 2012-05-15 LAB — RAPID URINE DRUG SCREEN, HOSP PERFORMED: Opiates: NOT DETECTED

## 2012-05-15 LAB — DIFFERENTIAL
Lymphocytes Relative: 33 % (ref 12–46)
Monocytes Absolute: 0.5 10*3/uL (ref 0.1–1.0)
Monocytes Relative: 7 % (ref 3–12)
Neutro Abs: 3.8 10*3/uL (ref 1.7–7.7)

## 2012-05-15 LAB — COMPREHENSIVE METABOLIC PANEL
BUN: 19 mg/dL (ref 6–23)
CO2: 28 mEq/L (ref 19–32)
Chloride: 99 mEq/L (ref 96–112)
Creatinine, Ser: 1.05 mg/dL (ref 0.50–1.35)
GFR calc Af Amer: 89 mL/min — ABNORMAL LOW (ref >90)
GFR calc non Af Amer: 76 mL/min — ABNORMAL LOW (ref >90)
Total Bilirubin: 0.4 mg/dL (ref 0.3–1.2)

## 2012-05-15 LAB — URINALYSIS, ROUTINE W REFLEX MICROSCOPIC
Glucose, UA: NEGATIVE mg/dL
Hgb urine dipstick: NEGATIVE
Specific Gravity, Urine: 1.008 (ref 1.005–1.030)
Urobilinogen, UA: 0.2 mg/dL (ref 0.0–1.0)

## 2012-05-15 LAB — TROPONIN I: Troponin I: <0.3 ng/mL (ref <0.30)

## 2012-05-15 LAB — CBC
HCT: 37 % — ABNORMAL LOW (ref 39.0–52.0)
Hemoglobin: 12.8 g/dL — ABNORMAL LOW (ref 13.0–17.0)
MCHC: 34.6 g/dL (ref 30.0–36.0)
WBC: 6.9 10*3/uL (ref 4.0–10.5)

## 2012-05-15 LAB — POCT I-STAT, CHEM 8
Creatinine, Ser: 1 mg/dL (ref 0.50–1.35)
Hemoglobin: 12.2 g/dL — ABNORMAL LOW (ref 13.0–17.0)
Sodium: 141 mEq/L (ref 135–145)
TCO2: 25 mmol/L (ref 0–100)

## 2012-05-15 LAB — POCT I-STAT TROPONIN I

## 2012-05-15 LAB — APTT: aPTT: 31 seconds (ref 24–37)

## 2012-05-15 MED ORDER — ACETAMINOPHEN 325 MG PO TABS
650.0000 mg | ORAL_TABLET | ORAL | Status: DC | PRN
  Administered 2012-05-16: 650 mg via ORAL
  Filled 2012-05-15: qty 2

## 2012-05-15 MED ORDER — OMEGA-3-ACID ETHYL ESTERS 1 G PO CAPS
3.0000 g | ORAL_CAPSULE | Freq: Every day | ORAL | Status: DC
  Administered 2012-05-15 – 2012-05-16 (×2): 3 g via ORAL
  Filled 2012-05-15 (×3): qty 3

## 2012-05-15 MED ORDER — PNEUMOCOCCAL VAC POLYVALENT 25 MCG/0.5ML IJ INJ
0.5000 mL | INJECTION | INTRAMUSCULAR | Status: AC
  Administered 2012-05-16: 0.5 mL via INTRAMUSCULAR
  Filled 2012-05-15: qty 0.5

## 2012-05-15 MED ORDER — LOSARTAN POTASSIUM 50 MG PO TABS
100.0000 mg | ORAL_TABLET | Freq: Every day | ORAL | Status: DC
  Filled 2012-05-15: qty 2

## 2012-05-15 MED ORDER — ATORVASTATIN CALCIUM 80 MG PO TABS
80.0000 mg | ORAL_TABLET | Freq: Every day | ORAL | Status: DC
  Administered 2012-05-15: 80 mg via ORAL
  Filled 2012-05-15 (×2): qty 1

## 2012-05-15 MED ORDER — TAMSULOSIN HCL 0.4 MG PO CAPS
0.4000 mg | ORAL_CAPSULE | Freq: Every day | ORAL | Status: DC
  Administered 2012-05-15 – 2012-05-16 (×2): 0.4 mg via ORAL
  Filled 2012-05-15 (×3): qty 1

## 2012-05-15 MED ORDER — DEXTROSE 50 % IV SOLN
INTRAVENOUS | Status: AC
  Filled 2012-05-15: qty 50

## 2012-05-15 MED ORDER — GLIPIZIDE 10 MG PO TABS: 10.0000 mg | ORAL_TABLET | Freq: Two times a day (BID) | ORAL | Status: DC

## 2012-05-15 MED ORDER — DEXTROSE 50 % IV SOLN
25.0000 mL | Freq: Once | INTRAVENOUS | Status: AC
  Administered 2012-05-15: 25 mL via INTRAVENOUS

## 2012-05-15 MED ORDER — LORAZEPAM 2 MG/ML IJ SOLN
1.0000 mg | Freq: Once | INTRAMUSCULAR | Status: AC
  Administered 2012-05-15: 1 mg via INTRAVENOUS
  Filled 2012-05-15: qty 1

## 2012-05-15 MED ORDER — PANTOPRAZOLE SODIUM 40 MG PO TBEC
40.0000 mg | DELAYED_RELEASE_TABLET | Freq: Two times a day (BID) | ORAL | Status: DC
  Administered 2012-05-15 – 2012-05-16 (×3): 40 mg via ORAL
  Filled 2012-05-15 (×3): qty 1

## 2012-05-15 MED ORDER — ONDANSETRON HCL 4 MG/2ML IJ SOLN
4.0000 mg | INTRAMUSCULAR | Status: DC | PRN
  Administered 2012-05-15 – 2012-05-16 (×2): 4 mg via INTRAVENOUS
  Filled 2012-05-15: qty 2

## 2012-05-15 MED ORDER — ATENOLOL 100 MG PO TABS
100.0000 mg | ORAL_TABLET | Freq: Two times a day (BID) | ORAL | Status: DC
  Filled 2012-05-15 (×2): qty 1

## 2012-05-15 MED ORDER — ENOXAPARIN SODIUM 40 MG/0.4ML ~~LOC~~ SOLN: 40.0000 mg | SUBCUTANEOUS | Status: DC

## 2012-05-15 MED ORDER — OMEGA-3 FATTY ACIDS 1000 MG PO CAPS: 6.0000 g | ORAL_CAPSULE | Freq: Every day | ORAL | Status: DC

## 2012-05-15 MED ORDER — ALPRAZOLAM 0.5 MG PO TABS
0.5000 mg | ORAL_TABLET | Freq: Four times a day (QID) | ORAL | Status: DC | PRN
  Administered 2012-05-15: 0.5 mg via ORAL
  Filled 2012-05-15: qty 1

## 2012-05-15 MED ORDER — ASPIRIN 325 MG PO TABS
325.0000 mg | ORAL_TABLET | Freq: Every day | ORAL | Status: DC
  Administered 2012-05-15 – 2012-05-16 (×2): 325 mg via ORAL
  Filled 2012-05-15 (×4): qty 1

## 2012-05-15 MED ORDER — OXYBUTYNIN CHLORIDE 5 MG PO TABS
5.0000 mg | ORAL_TABLET | Freq: Two times a day (BID) | ORAL | Status: DC
  Administered 2012-05-15 – 2012-05-16 (×3): 5 mg via ORAL
  Filled 2012-05-15 (×5): qty 1

## 2012-05-15 MED ORDER — CARBAMAZEPINE 200 MG PO TABS
200.0000 mg | ORAL_TABLET | Freq: Every day | ORAL | Status: DC
  Administered 2012-05-15 – 2012-05-16 (×2): 200 mg via ORAL
  Filled 2012-05-15 (×2): qty 1

## 2012-05-15 MED ORDER — SODIUM CHLORIDE 0.9 % IV SOLN: INTRAVENOUS | Status: DC

## 2012-05-15 MED ORDER — INSULIN ASPART 100 UNIT/ML ~~LOC~~ SOLN
0.0000 [IU] | Freq: Three times a day (TID) | SUBCUTANEOUS | Status: DC
  Administered 2012-05-16: 1 [IU] via SUBCUTANEOUS
  Administered 2012-05-16: 2 [IU] via SUBCUTANEOUS

## 2012-05-15 MED ORDER — FINASTERIDE 5 MG PO TABS
5.0000 mg | ORAL_TABLET | Freq: Every day | ORAL | Status: DC
  Administered 2012-05-15 – 2012-05-16 (×2): 5 mg via ORAL
  Filled 2012-05-15 (×3): qty 1

## 2012-05-15 MED ORDER — LAMOTRIGINE 25 MG PO TABS
25.0000 mg | ORAL_TABLET | Freq: Two times a day (BID) | ORAL | Status: DC
  Administered 2012-05-15 – 2012-05-16 (×3): 25 mg via ORAL
  Filled 2012-05-15 (×5): qty 1

## 2012-05-15 MED ORDER — METFORMIN HCL 500 MG PO TABS: 1000.0000 mg | ORAL_TABLET | Freq: Two times a day (BID) | ORAL | Status: DC

## 2012-05-15 NOTE — Progress Notes (Signed)
[facility] PROGRESS NOTE  Robert Walters YQM:[id number] DOB: Feb 25, 1956 DOA: 05/15/2012 PCP: Gwynneth Aliment, MD  Assessment/Plan: 1. Blurred vision, ataxia, suspected TIA: Continue aspirin per neurology. Complete TIA evaluation. Add chest x-ray. EKG sinus rhythm. No acute changes. 2. Mild hypoglycemia: Resolved. Follow CBG. Sliding-scale insulin. 3. Hypertension: Stable. Allow permissive hypertension. Hold atenolol, Cozaar. 4. Diabetes mellitus type II: Stable. Hold glipizide, metformin for now 5. Hyperlipidemia: Continue Zetia, fish oil, Crestor 6. GERD: Continue Protonix.  Code Status: Full code Family Communication: None present Disposition Plan: Likely home when evaluation completed 1/14.  Brendia Sacks, MD  [facility] Team 4  Pager [phone] If 7PM-7AM, please contact night-coverage at [email or web address], password [facility] 05/15/2012, 8:02 AM  LOS: 0 days   Brief narrative: 57 year old man presented to the emergency department with blurred vision and difficulty ambulating. Tele neurology consultation recommended TIA evaluation. Because [facility] had no beds patient was transferred to [facility].  Consultants:    Procedures:  2-D echocardiogram:  Bilateral carotid ultrasound:  HPI/Subjective: Afebrile, vital signs stable. Still has some blurred vision although distance vision has improved. No dysarthria or dysphagia.  Objective: Filed Vitals:   05/15/12 0500 05/15/12 0511 05/15/12 0600 05/15/12 0627  BP: 167/96 167/96 129/112 151/83  Pulse: 79 81 85 84  Temp:      TempSrc:      Resp:  16 22 16   Height:      Weight:      SpO2: 98% 99% 96% 99%   No intake or output data in the 24 hours ending 05/15/12 0802 Filed Weights   05/15/12 0228  Weight: 108.863 kg (240 lb)    Exam:  General:  Appears calm and comfortable Eyes: PERRL, normal lids, irises  ENT: grossly normal hearing, lips Cardiovascular: RRR, no m/r/g. No LE edema. Respiratory: CTA bilaterally,  no w/r/r. Normal respiratory effort. Musculoskeletal: grossly normal tone and strength BUE/BLE, no upper extremity dysdiadochokinesis  Psychiatric: grossly normal mood and affect, speech fluent and appropriate  Data Reviewed: Basic Metabolic Panel:  Lab 05/15/12 4401 05/15/12 0316  NA 141 139  K 4.0 4.0  CL 105 99  CO2 -- 28  GLUCOSE 118* 70  BUN 20 19  CREATININE 1.00 1.05  CALCIUM -- 9.9  MG -- --  PHOS -- --   Liver Function Tests:  Lab 05/15/12 0316  AST 21  ALT 23  ALKPHOS 81  BILITOT 0.4  PROT 7.1  ALBUMIN 4.1   CBC:  Lab 05/15/12 0341 05/15/12 0316  WBC -- 6.9  NEUTROABS -- 3.8  HGB 12.2* 12.8*  HCT 36.0* 37.0*  MCV -- 91.1  PLT -- 144*   Cardiac Enzymes:  Lab 05/15/12 0316  CKTOTAL --  CKMB --  CKMBINDEX --  TROPONINI <0.30   CBG:  Lab 05/15/12 0509 05/15/12 0328 05/15/12 0308  GLUCAP 145* 160* 65*   Studies: Ct Head Wo Contrast  05/15/2012  *RADIOLOGY REPORT*  Clinical Data: History of vertigo.  Dizziness.  Distance vision problems.  Nausea.  CT HEAD WITHOUT CONTRAST  Technique:  Contiguous axial images were obtained from the base of the skull through the vertex without contrast.  Comparison: 10/15/2011  Findings: The ventricles and sulci are symmetrical without significant effacement, displacement, or dilatation. No mass effect or midline shift. No abnormal extra-axial fluid collections. The grey-white matter junction is distinct. Basal cisterns are not effaced. No acute intracranial hemorrhage. No depressed skull fractures.  Visualized paranasal sinuses and mastoid air cells are not opacified.  No significant changes  since the previous study.  IMPRESSION: No acute intracranial abnormalities.  Code stroke results telephoned to Dr. Hyacinth Meeker at [facility] hours on 05/15/2012.   Original Report Authenticated By: Burman Nieves, M.D.     Scheduled Meds:   Continuous Infusions:    Principal Problem:  *TIA (transient ischemic attack) Active Problems:   Pure hypercholesterolemia  HTN (hypertension)  Blurred vision  Ataxia  Diabetes mellitus type 2 with complications  BPH (benign prostatic hyperplasia)  GERD (gastroesophageal reflux disease)  Obesity  Bipolar 1 disorder     Brendia Sacks, MD  [facility] Team 4  Pager [phone] If 7PM-7AM, please contact night-coverage at [email or web address], password [facility] 05/15/2012, 8:02 AM  LOS: 0 days   Time spent:  20 minutes

## 2012-05-15 NOTE — ED Notes (Signed)
Patient reports history of vertigo. States was attempting to sit on bed and reports dizziness started. Reports vision up close is fine, but states cannot see well with distance. Also complaining of nausea. Reports room appears to be spinning.

## 2012-05-15 NOTE — Progress Notes (Signed)
  Echocardiogram 2D Echocardiogram has been performed.  [place] 05/15/2012, 1:34 PM

## 2012-05-15 NOTE — ED Notes (Signed)
Teleneuro complete. Neurologist stated that he would recommend keeping patient on aspirin. Also reported that he would have an MRI performed on the patient.

## 2012-05-15 NOTE — ED Provider Notes (Signed)
History     CSN: [id number]  Arrival date & time 05/15/12  0216   First MD Initiated Contact with Patient 05/15/12 0235      Chief Complaint  Patient presents with  . Dizziness  . Nausea    (Consider location/radiation/quality/duration/timing/severity/associated sxs/prior treatment) HPI Comments: 57 year old male with a history of hypertension and diabetes and hyperlipidemia who presents with acute onset of blurred vision and difficulty ambulating which started one hour and 15 minutes ago. He states that prior to that he was able to drive his car to the nursing home to see his mother this evening, drove home and was doing just fine. When he went to get into bed at the aforementioned time he felt like his vision immediately went blurry and he felt like he was falling to the right side. He denies any difficulty using his arms or his legs and has no changes in his speech. He denies coughing, chest pain, shortness of breath, numbness, back pain, palpitations. He has never had any symptoms like this in the past, they are persistent and nothing seems to make it better or worse  The history is provided by the patient.    Past Medical History  Diagnosis Date  . Diabetes mellitus   . Hypertension   . Hyperlipidemia   . Obesity   . Acid reflux disease   . Gastritis   . BPH (benign prostatic hypertrophy)   . Vertigo   . Normal nuclear stress test Dec 2011    Past Surgical History  Procedure Date  . Cardiovascular stress test 2009    EF 63%    Family History  Problem Relation Age of Onset  . Hyperlipidemia Father   . Colon cancer Father   . Stroke Mother     History  Substance Use Topics  . Smoking status: Never Smoker   . Smokeless tobacco: Not on file  . Alcohol Use: No      Review of Systems  All other systems reviewed and are negative.    Allergies  Review of patient's allergies indicates no known allergies.  Home Medications   Current Outpatient Rx  Name   Route  Sig  Dispense  Refill  . MECLIZINE HCL 25 MG PO TABS   Oral   Take 25 mg by mouth 3 (three) times daily as needed.         . ALPRAZOLAM 0.5 MG PO TABS   Oral   Take 0.5 mg by mouth 4 (four) times daily as needed.          . ASPIRIN 81 MG PO TABS   Oral   Take 81 mg by mouth daily.           . ATENOLOL 100 MG PO TABS   Oral   Take 100 mg by mouth 2 (two) times daily.           [name] CARBAMAZEPINE 200 MG PO TABS   Oral   Take 200 mg by mouth daily.          [name] VITAMIN D3 5000 UNITS PO TABS   Oral   Take by mouth daily.           [name] CINNAMON PO   Oral   Take 1,000 mg by mouth daily.          [name] EZETIMIBE 10 MG PO TABS   Oral   Take 10 mg by mouth daily.           [name]  IRON 325 (65 FE) MG PO TABS   Oral   Take by mouth 2 (two) times daily.           [name] FINASTERIDE PO   Oral   Take 5 mg by mouth daily.          . OMEGA-3 FATTY ACIDS 1000 MG PO CAPS   Oral   Take 6 g by mouth daily.           [name] GARLIC PO   Oral   Take 1 capsule by mouth daily.         [name] GLIPIZIDE 10 MG PO TABS   Oral   Take 10 mg by mouth 2 (two) times daily before a meal.           . LAMOTRIGINE 25 MG PO TABS   Oral   Take 25 mg by mouth 2 (two) times daily.           [name] LOSARTAN POTASSIUM 100 MG PO TABS   Oral   Take 1 tablet (100 mg total) by mouth daily.   90 tablet   3   . METFORMIN HCL 1000 MG PO TABS   Oral   Take 1,000 mg by mouth 2 (two) times daily with a meal.           . ONE-DAILY MULTI VITAMINS PO TABS   Oral   Take 1 tablet by mouth daily.           . OXYBUTYNIN CHLORIDE 5 MG PO TABS   Oral   Take 5 mg by mouth 2 (two) times daily.          [name] PANTOPRAZOLE SODIUM 40 MG PO TBEC   Oral   Take 40 mg by mouth 2 (two) times daily.          [name] ROSUVASTATIN CALCIUM 20 MG PO TABS   Oral   Take 40 mg by mouth daily.          [name] TAMSULOSIN HCL 0.4 MG PO CAPS   Oral   Take 0.4 mg by mouth daily.             BP 164/95  Pulse 78   Temp 97.4 F (36.3 C) (Oral)  Resp 17  Ht 5\' 9"  (1.753 m)  Wt 240 lb (108.863 kg)  BMI 35.44 kg/m2  SpO2 100%  Physical Exam  Nursing note and vitals reviewed. Constitutional: He appears well-developed and well-nourished. No distress.  HENT:  Head: Normocephalic and atraumatic.  Mouth/Throat: Oropharynx is clear and moist. No oropharyngeal exudate.  Eyes: Conjunctivae normal and EOM are normal. Pupils are equal, round, and reactive to light. Right eye exhibits no discharge. Left eye exhibits no discharge. No scleral icterus.  Neck: Normal range of motion. Neck supple. No JVD present. No thyromegaly present.  Cardiovascular: Normal rate, regular rhythm, normal heart sounds and intact distal pulses.  Exam reveals no gallop and no friction rub.   No murmur heard. Pulmonary/Chest: Effort normal and breath sounds normal. No respiratory distress. He has no wheezes. He has no rales.  Abdominal: Soft. Bowel sounds are normal. He exhibits no distension and no mass. There is no tenderness.  Musculoskeletal: Normal range of motion. He exhibits no edema and no tenderness.  Lymphadenopathy:    He has no cervical adenopathy.  Neurological: He is alert. Coordination normal.       Visual acuity with blurry vision bilaterally, this is bilateral decreased vision, no diplopia, no vertigo,  no nystagmus, no focal weakness or numbness, normal finger-nose-finger, normal heel shin, normal pronator drift. The patient is able to ambulate but feels like he is falling to the right and does stumble to the right when he walks. His speech is clear, his cranial nerves III through XII are intact.  Skin: Skin is warm and dry. No rash noted. No erythema.  Psychiatric: He has a normal mood and affect. His behavior is normal.    ED Course  Procedures (including critical care time)  Labs Reviewed  CBC - Abnormal; Notable for the following:    RBC 4.06 (*)     Hemoglobin 12.8 (*)     HCT 37.0 (*)     Platelets 144 (*)      All other components within normal limits  COMPREHENSIVE METABOLIC PANEL - Abnormal; Notable for the following:    GFR calc non Af Amer 76 (*)     GFR calc Af Amer 89 (*)     All other components within normal limits  GLUCOSE, CAPILLARY - Abnormal; Notable for the following:    Glucose-Capillary 65 (*)     All other components within normal limits  GLUCOSE, CAPILLARY - Abnormal; Notable for the following:    Glucose-Capillary 160 (*)     All other components within normal limits  POCT I-STAT, CHEM 8 - Abnormal; Notable for the following:    Glucose, Bld 118 (*)     Hemoglobin 12.2 (*)     HCT 36.0 (*)     All other components within normal limits  ETHANOL  PROTIME-INR  APTT  DIFFERENTIAL  TROPONIN I  POCT I-STAT TROPONIN I  URINE RAPID DRUG SCREEN (HOSP PERFORMED)  URINALYSIS, ROUTINE W REFLEX MICROSCOPIC   Ct Head Wo Contrast  05/15/2012  *RADIOLOGY REPORT*  Clinical Data: History of vertigo.  Dizziness.  Distance vision problems.  Nausea.  CT HEAD WITHOUT CONTRAST  Technique:  Contiguous axial images were obtained from the base of the skull through the vertex without contrast.  Comparison: 10/15/2011  Findings: The ventricles and sulci are symmetrical without significant effacement, displacement, or dilatation. No mass effect or midline shift. No abnormal extra-axial fluid collections. The grey-white matter junction is distinct. Basal cisterns are not effaced. No acute intracranial hemorrhage. No depressed skull fractures.  Visualized paranasal sinuses and mastoid air cells are not opacified.  No significant changes since the previous study.  IMPRESSION: No acute intracranial abnormalities.  Code stroke results telephoned to Dr. Hyacinth Meeker at [facility] hours on 05/15/2012.   Original Report Authenticated By: Burman Nieves, M.D.      1. Hypoglycemia   2. TIA (transient ischemic attack)       MDM  There is concern for code stroke in this patient. He has been 1 hour and 15 minutes  since onset which was 1:30 AM. I have called the code stroke, CT scan of the head to be done stat, will discuss with neurology.  0300 - discussed with Dr. Amada Jupiter - agrees with NIH stroke scale of 2, will obtain teleneurology consult at his request prior to considering TPA.    ED ECG REPORT  I personally interpreted this EKG   Date: 05/15/2012   Rate: 82  Rhythm: normal sinus rhythm  QRS Axis: normal  Intervals: QRS prolonged  ST/T Wave abnormalities: nonspecific T wave changes  Conduction Disutrbances:Incomplete right bundle branch block  Narrative Interpretation:   Old EKG Reviewed: unchanged compared with 10/15/2011  Teleneurology has seen pt as well and agrees  that possible TIA though possible hypoglycemia - pt is still symptomatic though mildly at this time.  Has tolerated PO and D50, no longer hypoglycemic - was 160 on recheck.    Care discussed with the hospitalist Dr. Orvan Falconer, will come to see pt.       Vida Roller, MD 05/15/12 [phone]

## 2012-05-15 NOTE — H&P (Signed)
[facility] History and Physical  Robert Walters  QMV:[id number]  DOB: 01-11-1956   DOA: 05/15/2012   PCP:   Gwynneth Aliment, MD   Chief Complaint:  Blurred vision and dizziness since about 1:30 AM today  HPI: Robert Walters is an 57 y.o. male.   Caucasian gentleman with multiple stroke risk factors as listed below, was in his baseline state of noted about 1:30 this his distant vision was blurred, that when he held things right in front of him he could see properly but could not make out objects fairly even few feet away. Also noted that he was falling to the left and trying to walk and was having some lightheadedness and dizziness, and nausea.  He denied any disturbance of speech or swallowing or focal weakness. He has no prior history of stroke.  Despite the visual problems he was able to drive himself  3 miles to the emergency room and initial evaluation revealed him to be mildly hypoglycemic with a blood sugar of 65. He received intravenous glucose, juice and something to eat and over time there was some improvement in vision however it is still not fully recovered, and he was falling to the left when walking with the emergency room. He reports he has had hypoglycemic episodes in the past but never associated with this these type of symptoms, and he had none of his typical hypoglycemic symptoms such as sweating and palpitations.  He had the benefit of a neuro-tele consult and the neurologist felt symptoms could possibly be due to hypoglycemia but could be a TIA or stroke and he should be admitted for an MRI. Since there are no beds available at [facility], he is being admitted to [facility] for further evaluation.  While in the emergency room he was incontinent of urine, but says this is related to his BPH.  Rewiew of Systems:   All systems negative except as marked bold or noted in the HPI;  Constitutional:  malaise, fever and chills. ;  Eyes: eye pain, redness and  discharge. ; myopia ENMT: ear pain, hoarseness, nasal congestion, sinus pressure and sore throat. ;  Cardiovascular:  chest pain, palpitations, diaphoresis, dyspnea and peripheral edema. ;  Respiratory:  cough, hemoptysis, wheezing and stridor. ;  Gastrointestinal:  nausea, vomiting, diarrhea, constipation, abdominal pain, melena, blood in stool, hematemesis, jaundice and rectal bleeding. unusual weight loss..  14 lbs in 5 months Genitourinary: r frequency, dysuria, urge incontinence,flank pain and hematuria; Musculoskeletal:  back pain and neck pain.  swelling and trauma.;  Skin: .  pruritus, rash, abrasions, bruising and skin lesion.; ulcerations Neuro:  headache, lightheadedness and neck stiffness.  weakness, altered level of consciousness , altered mental status, extremity weakness, burning feet, involuntary movement, seizure and syncope.  Psych:  anxiety, depression, insomnia, tearfulness, panic attacks, hallucinations, paranoia, suicidal or homicidal ideation    Past Medical History  Diagnosis Date  . Diabetes mellitus   . Hypertension   . Hyperlipidemia   . Obesity   . Acid reflux disease   . Gastritis   . BPH (benign prostatic hypertrophy)   . Vertigo   . Normal nuclear stress test Dec 2011    Past Surgical History  Procedure Date  . Cardiovascular stress test 2009    EF 63%    Medications:  HOME MEDS: Prior to Admission medications   Medication Sig Start Date End Date Taking? Authorizing Provider  meclizine (ANTIVERT) 25 MG tablet Take 25 mg by mouth 3 (three)  times daily as needed.   Yes Historical Provider, MD  ALPRAZolam Prudy Feeler) 0.5 MG tablet Take 0.5 mg by mouth 4 (four) times daily as needed.     Historical Provider, MD  aspirin 81 MG tablet Take 81 mg by mouth daily.      Historical Provider, MD  atenolol (TENORMIN) 100 MG tablet Take 100 mg by mouth 2 (two) times daily.      Historical Provider, MD  carbamazepine (TEGRETOL) 200 MG tablet Take 200 mg by mouth  daily.     Historical Provider, MD  Cholecalciferol (VITAMIN D3) 5000 UNITS TABS Take by mouth daily.      Historical Provider, MD  CINNAMON PO Take 1,000 mg by mouth daily.     Historical Provider, MD  ezetimibe (ZETIA) 10 MG tablet Take 10 mg by mouth daily.      Historical Provider, MD  Ferrous Sulfate (IRON) 325 (65 FE) MG TABS Take by mouth 2 (two) times daily.      Historical Provider, MD  FINASTERIDE PO Take 5 mg by mouth daily.     Historical Provider, MD  fish oil-omega-3 fatty acids 1000 MG capsule Take 6 g by mouth daily.      Historical Provider, MD  GARLIC PO Take 1 capsule by mouth daily.    Historical Provider, MD  glipiZIDE (GLUCOTROL) 10 MG tablet Take 10 mg by mouth 2 (two) times daily before a meal.      Historical Provider, MD  lamoTRIgine (LAMICTAL) 25 MG tablet Take 25 mg by mouth 2 (two) times daily.      Historical Provider, MD  losartan (COZAAR) 100 MG tablet Take 1 tablet (100 mg total) by mouth daily. 01/27/12   Vesta Mixer, MD  metFORMIN (GLUCOPHAGE) 1000 MG tablet Take 1,000 mg by mouth 2 (two) times daily with a meal.      Historical Provider, MD  Multiple Vitamin (MULTIVITAMIN) tablet Take 1 tablet by mouth daily.      Historical Provider, MD  oxybutynin (DITROPAN) 5 MG tablet Take 5 mg by mouth 2 (two) times daily.     Historical Provider, MD  pantoprazole (PROTONIX) 40 MG tablet Take 40 mg by mouth 2 (two) times daily.     Historical Provider, MD  rosuvastatin (CRESTOR) 20 MG tablet Take 40 mg by mouth daily.     Historical Provider, MD  Tamsulosin HCl (FLOMAX) 0.4 MG CAPS Take 0.4 mg by mouth daily.      Historical Provider, MD     Allergies:  No Known Allergies  Social History:   reports that he has never smoked. He does not have any smokeless tobacco history on file. He reports that he does not drink alcohol or use illicit drugs.  Family History: Family History  Problem Relation Age of Onset  . Hyperlipidemia Father   . Colon cancer Father   .  Stroke Mother      Physical Exam: Filed Vitals:   05/15/12 0500 05/15/12 0511 05/15/12 0600 05/15/12 0627  BP: 167/96 167/96 129/112 151/83  Pulse: 79 81 85 84  Temp:      TempSrc:      Resp:  16 22 16   Height:      Weight:      SpO2: 98% 99% 96% 99%   Blood pressure 151/83, pulse 84, temperature 97.4 F (36.3 C), temperature source Oral, resp. rate 16, height 5\' 9"  (1.753 m), weight 108.863 kg (240 lb), SpO2 99.00%.  GEN:  Pleasant  Caucasian  gentlemanlying in the stretcher in no acute distress; cooperative with exam PSYCH:  alert and oriented x4;  a little confused about the events of the night; affect is appropriate. HEENT: Mucous membranes pink and anicteric; PERRLA; EOM intact; no cervical lymphadenopathy nor thyromegaly or carotid bruit; no JVD; Breasts:: Not examined CHEST WALL: No tenderness CHEST: Normal respiration, clear to auscultation bilaterally HEART: Regular rate and rhythm; no murmurs rubs or gallops BACK: No kyphosis or scoliosis; no CVA tenderness ABDOMEN: Obese, soft non-tender; no masses, no organomegaly, normal abdominal bowel sounds; no intertriginous candida. Rectal Exam: Not done EXTREMITIES:  age-appropriate arthropathy of the hands and knees; no edema; no ulcerations. Genitalia: not examined PULSES: 2+ and symmetric SKIN: Normal hydration no rash or ulceration CNS: Cranial nerves 2-12 grossly intact no focal lateralizing neurologic deficit   Labs on Admission:  Basic Metabolic Panel:  Lab 05/15/12 1610 05/15/12 0316  NA 141 139  K 4.0 4.0  CL 105 99  CO2 -- 28  GLUCOSE 118* 70  BUN 20 19  CREATININE 1.00 1.05  CALCIUM -- 9.9  MG -- --  PHOS -- --   Liver Function Tests:  Lab 05/15/12 0316  AST 21  ALT 23  ALKPHOS 81  BILITOT 0.4  PROT 7.1  ALBUMIN 4.1   No results found for this basename: LIPASE:5,AMYLASE:5 in the last 168 hours No results found for this basename: AMMONIA:5 in the last 168 hours CBC:  Lab 05/15/12 0341  05/15/12 0316  WBC -- 6.9  NEUTROABS -- 3.8  HGB 12.2* 12.8*  HCT 36.0* 37.0*  MCV -- 91.1  PLT -- 144*   Cardiac Enzymes:  Lab 05/15/12 0316  CKTOTAL --  CKMB --  CKMBINDEX --  TROPONINI <0.30   BNP: No components found with this basename: POCBNP:5 D-dimer: No components found with this basename: D-DIMER:5 CBG:  Lab 05/15/12 0509 05/15/12 0328 05/15/12 0308  GLUCAP 145* 160* 65*    Radiological Exams on Admission: Ct Head Wo Contrast  05/15/2012  *RADIOLOGY REPORT*  Clinical Data: History of vertigo.  Dizziness.  Distance vision problems.  Nausea.  CT HEAD WITHOUT CONTRAST  Technique:  Contiguous axial images were obtained from the base of the skull through the vertex without contrast.  Comparison: 10/15/2011  Findings: The ventricles and sulci are symmetrical without significant effacement, displacement, or dilatation. No mass effect or midline shift. No abnormal extra-axial fluid collections. The grey-white matter junction is distinct. Basal cisterns are not effaced. No acute intracranial hemorrhage. No depressed skull fractures.  Visualized paranasal sinuses and mastoid air cells are not opacified.  No significant changes since the previous study.  IMPRESSION: No acute intracranial abnormalities.  Code stroke results telephoned to Dr. Hyacinth Meeker at [facility] hours on 05/15/2012.   Original Report Authenticated By: Burman Nieves, M.D.     EKG: Independently reviewed.  normal sinus rhythm; left atrial enlargement; incomplete right bundle branch block   Assessment/Plan Present on Admission:  . TIA (transient ischemic attack) . Blurred vision . Ataxia . Diabetes mellitus type 2 with complications . Pure hypercholesterolemia . HTN (hypertension) . BPH (benign prostatic hyperplasia) . GERD (gastroesophageal reflux disease) . Obesity . Bipolar 1 disorder   PLAN:  sense of blurred vision and ataxia is likely caused by a TIA agree with admission to [facility] for a TIA  workup. Will continue his current antidiabetic regimen, and given a sensitive sliding scale but no bedtime coverage. If he is demonstrated to be hypoglycemic in hospital, his outpatient medications may need  to adjustment.  Continue essential home medications   Other plans as per orders.  Code Status: FULL CODE  Disposition Plan: Depending on the results of evaluation and progression of disease.    Toniya Rozar Nocturnist [facility] Pager [phone]   05/15/2012, 6:59 AM

## 2012-05-15 NOTE — ED Notes (Signed)
Patient reports unable to get urine sample at this time.

## 2012-05-15 NOTE — Progress Notes (Signed)
VASCULAR LAB PRELIMINARY  PRELIMINARY  PRELIMINARY  PRELIMINARY  Carotid duplex  completed.    Preliminary report:  No significant ICA stenosis or plaque noted bilaterally.  Vertebral artery flow antegrade.  Diesha Rostad, RVT 05/15/2012, 2:52 PM

## 2012-05-15 NOTE — Evaluation (Signed)
Physical Therapy Evaluation Patient Details Name: Robert Walters MRN: [id number] DOB: [birth year] Today's Date: 05/15/2012 Time: 4098-1191 PT Time Calculation (min): 21 min  PT Assessment / Plan / Recommendation Clinical Impression  Pt 57 yo male with suspected TIA. Patient reports vision deficits to have mostly resolved. Pt functioning close to baseline. Acute PT to follow pt while here to address mild balance deficits.    PT Assessment  Patient needs continued PT services    Follow Up Recommendations  No PT follow up    Does the patient have the potential to tolerate intense rehabilitation      Barriers to Discharge        Equipment Recommendations  None recommended by PT    Recommendations for Other Services     Frequency Min 4X/week    Precautions / Restrictions Precautions Precautions: None Restrictions Weight Bearing Restrictions: No   Pertinent Vitals/Pain 0/10      Mobility  Bed Mobility Bed Mobility: Supine to Sit Supine to Sit: With rails;6: Modified independent (Device/Increase time);HOB flat Details for Bed Mobility Assistance: increased time but safe Transfers Transfers: Sit to Stand;Stand to Sit Sit to Stand: 6: Modified independent (Device/Increase time);With upper extremity assist;From bed Stand to Sit: 6: Modified independent (Device/Increase time);With armrests;To chair/3-in-1 Details for Transfer Assistance: safe technique Ambulation/Gait Ambulation/Gait Assistance: 5: Supervision Ambulation Distance (Feet): 250 Feet Assistive device: None Ambulation/Gait Assistance Details: pt with mildy unsteadiness but no obvious LOB, v/c's to increase base of support, pt with bilat LEs internally rotated. Gait Pattern: Decreased stride length;Narrow base of support Gait velocity: wfl General Gait Details: no LOB with head turns L/R or up/down Stairs: No Modified Rankin (Stroke Patients Only) Pre-Morbid Rankin Score: No symptoms Modified Rankin: No  symptoms    Shoulder Instructions     Exercises     PT Diagnosis: Difficulty walking  PT Problem List: Decreased strength;Decreased activity tolerance;Decreased balance;Decreased mobility PT Treatment Interventions: Gait training;Stair training;Functional mobility training;Therapeutic activities;Therapeutic exercise   PT Goals Acute Rehab PT Goals PT Goal Formulation: With patient Time For Goal Achievement: 05/22/12 Potential to Achieve Goals: Good Pt will go Supine/Side to Sit: Independently PT Goal: Supine/Side to Sit - Progress: Goal set today Pt will go Sit to Stand: Independently PT Goal: Sit to Stand - Progress: Goal set today Pt will Ambulate: >150 feet;Independently PT Goal: Ambulate - Progress: Goal set today Pt will Go Up / Down Stairs: 3-5 stairs;Independently PT Goal: Up/Down Stairs - Progress: Goal set today Additional Goals Additional Goal #1: Pt to score > 19 on DGI to indicate decreased fall risk. PT Goal: Additional Goal #1 - Progress: Goal set today  Visit Information  Last PT Received On: 05/15/12 Assistance Needed: +1    Subjective Data  Subjective: Pt received supine in bed agreeable to PT   Prior Functioning  Home Living Lives With:  (elderly father) Available Help at Discharge: Family (elderly father in which patient is caregiver) Type of Home: Mobile home Home Access: Stairs to enter;Ramped entrance Entrance Stairs-Number of Steps: 3 Entrance Stairs-Rails: Can reach both Home Layout: One level Bathroom Shower/Tub: Engineer, manufacturing systems: Standard Bathroom Accessibility: No Home Adaptive Equipment: Tub transfer bench;Walker - rolling;Straight cane Prior Function Level of Independence: Independent Able to Take Stairs?: Yes Driving: Yes Vocation: On disability Communication Communication: No difficulties Dominant Hand: Right    Cognition  Overall Cognitive Status: Impaired Area of Impairment: Attention;Following  commands Arousal/Alertness: Awake/alert Orientation Level: Oriented X4 / Intact Behavior During Session: [facility] for tasks performed  Current Attention Level: Selective Following Commands: Follows multi-step commands inconsistently (delayed processing)    Extremity/Trunk Assessment Right Upper Extremity Assessment RUE ROM/Strength/Tone: Within functional levels RUE Sensation: WFL - Light Touch Left Upper Extremity Assessment LUE ROM/Strength/Tone: Within functional levels LUE Sensation: WFL - Light Touch Right Lower Extremity Assessment RLE ROM/Strength/Tone: Within functional levels RLE Sensation: WFL - Light Touch Left Lower Extremity Assessment LLE ROM/Strength/Tone: Within functional levels LLE Sensation: WFL - Light Touch Trunk Assessment Trunk Assessment: Normal   Balance Balance Balance Assessed: Yes Dynamic Standing Balance Dynamic Standing - Balance Support: No upper extremity supported (ambulation) Dynamic Standing - Level of Assistance: 5: Stand by assistance  End of Session PT - End of Session Equipment Utilized During Treatment: Gait belt Activity Tolerance: Patient tolerated treatment well Patient left: in chair;with call bell/phone within reach Nurse Communication: Mobility status  GP Functional Assessment Tool Used: clinical judgement Functional Limitation: Mobility: Walking and moving around Mobility: Walking and Moving Around Current Status ([id number]): At least 1 percent but less than 20 percent impaired, limited or restricted Mobility: Walking and Moving Around Goal Status [phone]): 0 percent impaired, limited or restricted   Marcene Brawn 05/15/2012, 4:52 PM   Lewis Shock, PT, DPT Pager #: [phone] Office #: [phone]

## 2012-05-16 LAB — LIPID PANEL
HDL: 48 mg/dL (ref >39)
Total CHOL/HDL Ratio: 3.4 RATIO
VLDL: 42 mg/dL — ABNORMAL HIGH (ref 0–40)

## 2012-05-16 LAB — GLUCOSE, CAPILLARY: Glucose-Capillary: 170 mg/dL — ABNORMAL HIGH (ref 70–99)

## 2012-05-16 MED ORDER — ONDANSETRON HCL 4 MG PO TABS
4.0000 mg | ORAL_TABLET | Freq: Once | ORAL | Status: AC
  Administered 2012-05-16: 4 mg via ORAL
  Filled 2012-05-16: qty 1

## 2012-05-16 MED ORDER — ASPIRIN 325 MG PO TABS: 325.0000 mg | ORAL_TABLET | Freq: Every day | ORAL | Status: DC

## 2012-05-16 NOTE — Progress Notes (Signed)
Dc instructions provided. Pt verbalized understanding. Iv dc intact. Pt under no  S/s distress.

## 2012-05-16 NOTE — Care Management Note (Signed)
    Page 1 of 1   05/16/2012     2:06:10 PM   CARE MANAGEMENT NOTE 05/16/2012  Patient:  Robert Walters, Robert Walters   Account Number:  [id number]  Date Initiated:  05/15/2012  Documentation initiated by:  Jacquelynn Cree  Subjective/Objective Assessment:   Admitted for TIA workup     Action/Plan:   PT evals-no follow up recommended, no equipment needed   Anticipated DC Date:  05/16/2012   Anticipated DC Plan:  HOME/SELF CARE      DC Planning Services  CM consult      Choice offered to / List presented to:             Status of service:  Completed, signed off Medicare Important Message given?   (If response is "NO", the following Medicare IM given date fields will be blank) Date Medicare IM given:   Date Additional Medicare IM given:    Discharge Disposition:  HOME/SELF CARE  Per UR Regulation:  Reviewed for med. necessity/level of care/duration of stay  If discussed at Long Length of Stay Meetings, dates discussed:    Comments:

## 2012-05-16 NOTE — Discharge Summary (Signed)
Physician Discharge Summary  Robert Walters:[id number] DOB: 1955-08-09 DOA: 05/15/2012  PCP: Gwynneth Aliment, MD  Admit date: 05/15/2012 Discharge date: 05/16/2012  Time spent: 25 minutes  Recommendations for Outpatient Follow-up:  1. Patient was changed from aspirin 81-325 mg 2. Recommend outpatient gastroenterology followup for what sounds like gastroparesis  Discharge Diagnoses:  Principal Problem:  *TIA (transient ischemic attack) Active Problems:  Pure hypercholesterolemia  HTN (hypertension)  Blurred vision  Ataxia  Diabetes mellitus type 2 with complications  BPH (benign prostatic hyperplasia)  GERD (gastroesophageal reflux disease)  Obesity  Bipolar 1 disorder   Discharge Condition: Stable  Diet recommendation: Heart healthy low-salt  Filed Weights   05/15/12 0228  Weight: 108.863 kg (240 lb)    History of present illness:  57 year old Caucasian male admission 05/15/2012 with amaurosis fugax-atrial to the emergency room received IV glucose because patient had a blood sugar 65 MS not to be falling to the left. Tele- neurology saw the patient and felt that that he should be admitted for workup and an MRI. Physical therapy saw patient and cleared him for discharge home Workup proved negative as per below   Hospital Course:  1. Blurred vision, ataxia, suspected TIA: Continue aspirin per neurology. Echocardiogram EF 55-60% grade 1 diastolic dysfunction, MRI brain negative potentially TIA. EKG normal 2. Mild hypoglycemia: Resolved. On discharge home continue home medications, fell and likely secondary to Foley hypoglycemic given lack of other symptoms 3. Hypertension: Stable. Allow permissive hypertension. Restarted blood pressure medicines on discharge 4. Diabetes mellitus type II: Stable. Hold glipizide, metformin for now 5. Hyperlipidemia: Continue Zetia, fish oil, Crestor 6. GERD: Continue Protonix. 7. Chronic gastroparesis from diabetes-outpatient GI  followup  Discharge Exam: Filed Vitals:   05/15/12 0836 05/15/12 2200 05/16/12 0201 05/16/12 0600  BP: 162/85 154/83 119/53 118/62  Pulse: 80 71 68 72  Temp: 97.5 F (36.4 C) 97.9 F (36.6 C) 97.9 F (36.6 C) 97.6 F (36.4 C)  TempSrc: Oral     Resp: 22 18 18 18   Height:      Weight:      SpO2: 98% 95% 97% 98%   Alert pleasant no specific issues. Has chronic nausea and states that he is feeling about the same as usual. No chest pain or shortness of breath blurred vision symptoms resolved  General: Alert pleasant morbidly obese Cardiovascular: Some S2 no murmur rub or gallop Respiratory: Clinically clear  Discharge Instructions  Discharge Orders    Future Orders Please Complete By Expires   Diet - low sodium heart healthy      Increase activity slowly      Call MD for:  persistant nausea and vomiting      Call MD for:  temperature >100.4      Call MD for:  difficulty breathing, headache or visual disturbances      Call MD for:  persistant dizziness or light-headedness          Medication List     As of 05/16/2012 10:04 AM    STOP taking these medications         multivitamin with minerals Tabs      TAKE these medications         aspirin 325 MG tablet   Take 1 tablet (325 mg total) by mouth daily.      atenolol 100 MG tablet   Commonly known as: TENORMIN   Take 100 mg by mouth 2 (two) times daily.      Biotin 1000 MCG  tablet   Take 1,000 mcg by mouth daily.      CINNAMON PO   Take 1,000 mg by mouth 2 (two) times daily.      EPITOL 200 MG tablet   Generic drug: carbamazepine   Take 200 mg by mouth 2 (two) times daily.      ezetimibe 10 MG tablet   Commonly known as: ZETIA   Take 10 mg by mouth daily.      fish oil-omega-3 fatty acids 1000 MG capsule   Take 6 g by mouth daily.      Garlic 1000 MG Caps   Take 10,000 mg by mouth daily.      glipiZIDE 10 MG tablet   Commonly known as: GLUCOTROL   Take 10 mg by mouth 2 (two) times daily before a  meal.      Iron 325 (65 FE) MG Tabs   Take by mouth 2 (two) times daily.      Krill Oil 300 MG Caps   Take 300 mg by mouth daily.      lamoTRIgine 200 MG tablet   Commonly known as: LAMICTAL   Take 200 mg by mouth 2 (two) times daily.      losartan 100 MG tablet   Commonly known as: COZAAR   Take 1 tablet (100 mg total) by mouth daily.      meclizine 25 MG tablet   Commonly known as: ANTIVERT   Take 25 mg by mouth 2 (two) times daily.      metFORMIN 1000 MG tablet   Commonly known as: GLUCOPHAGE   Take 1,000 mg by mouth 2 (two) times daily with a meal.      metoCLOPramide 5 MG tablet   Commonly known as: REGLAN   Take 5 mg by mouth 4 (four) times daily.      oxybutynin 5 MG 24 hr tablet   Commonly known as: DITROPAN-XL   Take 5 mg by mouth daily.      pantoprazole 40 MG tablet   Commonly known as: PROTONIX   Take 40 mg by mouth 2 (two) times daily.      rosuvastatin 40 MG tablet   Commonly known as: CRESTOR   Take 40 mg by mouth daily.      Tamsulosin HCl 0.4 MG Caps   Commonly known as: FLOMAX   Take 0.4 mg by mouth 2 (two) times daily.      TRILIPIX 135 MG capsule   Generic drug: Choline Fenofibrate   Take 135 mg by mouth daily.      Vitamin D3 5000 UNITS Tabs   Take 5,000 Units by mouth daily.      vitamin E 400 UNIT capsule   Take 400 Units by mouth daily.            Follow-up Information    Follow up with Gwynneth Aliment, MD. Schedule an appointment as soon as possible for a visit in 1 week.   Contact information:   [address] [place] [address] [phone]           The results of significant diagnostics from this hospitalization (including imaging, microbiology, ancillary and laboratory) are listed below for reference.    Significant Diagnostic Studies: Dg Chest 2 View  05/15/2012  *RADIOLOGY REPORT*  Clinical Data: TIA  CHEST - 2 VIEW  Comparison: 10/15/2011  Findings: Cardiac enlargement without heart failure.   Lungs are clear without infiltrate or effusion.  No mass lesion identified.  IMPRESSION: Cardiac enlargement without acute cardiopulmonary abnormality.   Original Report Authenticated By: Janeece Riggers, M.D.    Ct Head Wo Contrast  05/15/2012  *RADIOLOGY REPORT*  Clinical Data: History of vertigo.  Dizziness.  Distance vision problems.  Nausea.  CT HEAD WITHOUT CONTRAST  Technique:  Contiguous axial images were obtained from the base of the skull through the vertex without contrast.  Comparison: 10/15/2011  Findings: The ventricles and sulci are symmetrical without significant effacement, displacement, or dilatation. No mass effect or midline shift. No abnormal extra-axial fluid collections. The grey-white matter junction is distinct. Basal cisterns are not effaced. No acute intracranial hemorrhage. No depressed skull fractures.  Visualized paranasal sinuses and mastoid air cells are not opacified.  No significant changes since the previous study.  IMPRESSION: No acute intracranial abnormalities.  Code stroke results telephoned to Dr. Hyacinth Meeker at [facility] hours on 05/15/2012.   Original Report Authenticated By: Burman Nieves, M.D.    Mri Brain Without Contrast  05/15/2012  *RADIOLOGY REPORT*  Clinical Data:  Dizziness.  Vertigo and ataxia  MRI HEAD WITHOUT CONTRAST MRA HEAD WITHOUT CONTRAST  Technique:  Multiplanar, multiecho pulse sequences of the brain and surrounding structures were obtained without intravenous contrast. Angiographic images of the head were obtained using MRA technique without contrast.  Comparison:  CT head 05/15/2012, MRI 03/24/2009  MRI HEAD  Findings:  Negative for acute infarct.  Hyperintensity in the pons is stable from 2010 and most consistent with chronic microvascular ischemia.  No cortical infarct.  Negative for mass or edema.  No hemorrhage or fluid collection.  No shift of the midline structures.  Mild mucosal edema left maxillary sinus consistent with mucous retention cyst.  Vessels  at the base of the brain are patent.  IMPRESSION: Chronic changes in the pons.  No acute infarct or mass.  MRA HEAD  Findings: Both vertebral arteries are patent to the basilar. PICA not visualized.  AICA and superior cerebellar arteries are patent.  Posterior cerebral arteries are patent bilaterally.  Internal carotid artery is patent bilaterally without stenosis. The anterior and middle cerebral arteries are patent bilaterally.  Negative for cerebral aneurysm.  IMPRESSION: Negative   Original Report Authenticated By: Janeece Riggers, M.D.    Mr Mra Head/brain Wo Cm  05/15/2012  *RADIOLOGY REPORT*  Clinical Data:  Dizziness.  Vertigo and ataxia  MRI HEAD WITHOUT CONTRAST MRA HEAD WITHOUT CONTRAST  Technique:  Multiplanar, multiecho pulse sequences of the brain and surrounding structures were obtained without intravenous contrast. Angiographic images of the head were obtained using MRA technique without contrast.  Comparison:  CT head 05/15/2012, MRI 03/24/2009  MRI HEAD  Findings:  Negative for acute infarct.  Hyperintensity in the pons is stable from 2010 and most consistent with chronic microvascular ischemia.  No cortical infarct.  Negative for mass or edema.  No hemorrhage or fluid collection.  No shift of the midline structures.  Mild mucosal edema left maxillary sinus consistent with mucous retention cyst.  Vessels at the base of the brain are patent.  IMPRESSION: Chronic changes in the pons.  No acute infarct or mass.  MRA HEAD  Findings: Both vertebral arteries are patent to the basilar. PICA not visualized.  AICA and superior cerebellar arteries are patent.  Posterior cerebral arteries are patent bilaterally.  Internal carotid artery is patent bilaterally without stenosis. The anterior and middle cerebral arteries are patent bilaterally.  Negative for cerebral aneurysm.  IMPRESSION: Negative   Original Report Authenticated By: Janeece Riggers, M.D.  Microbiology: No results found for this or any previous  visit (from the past 240 hour(s)).   Labs: Basic Metabolic Panel:  Lab 05/15/12 4540 05/15/12 0316  NA 141 139  K 4.0 4.0  CL 105 99  CO2 -- 28  GLUCOSE 118* 70  BUN 20 19  CREATININE 1.00 1.05  CALCIUM -- 9.9  MG -- --  PHOS -- --   Liver Function Tests:  Lab 05/15/12 0316  AST 21  ALT 23  ALKPHOS 81  BILITOT 0.4  PROT 7.1  ALBUMIN 4.1   No results found for this basename: LIPASE:5,AMYLASE:5 in the last 168 hours No results found for this basename: AMMONIA:5 in the last 168 hours CBC:  Lab 05/15/12 0341 05/15/12 0316  WBC -- 6.9  NEUTROABS -- 3.8  HGB 12.2* 12.8*  HCT 36.0* 37.0*  MCV -- 91.1  PLT -- 144*   Cardiac Enzymes:  Lab 05/15/12 0316  CKTOTAL --  CKMB --  CKMBINDEX --  TROPONINI <0.30   BNP: BNP (last 3 results) No results found for this basename: PROBNP:3 in the last 8760 hours CBG:  Lab 05/16/12 0627 05/15/12 2215 05/15/12 1707 05/15/12 0509 05/15/12 0328  GLUCAP 141* 174* 129* 145* 160*       Signed:  Rhetta Mura  [facility] 05/16/2012, 10:00 AM

## 2012-05-16 NOTE — Clinical Social Work Note (Signed)
Clinical Social Work   CSW was consulted for transportation needs. CSW met with pt address transportation options. Pt shared that he did not want to bother his father or friends with a ride. Pt inquired about obtaining a cab to provide transportation. Pt shared that he is willing to pay for cab to bring him to his car. CSW provided a quote, and pt was agreeable. CSW provide cab company's # to RN to call when pt is ready for discharge. CSW signing off as no further needs identified.   Dede Query, MSW, Theresia Majors [phone]

## 2012-07-13 ENCOUNTER — Encounter (HOSPITAL_COMMUNITY): Payer: Self-pay | Admitting: Family Medicine

## 2012-07-13 ENCOUNTER — Emergency Department (HOSPITAL_COMMUNITY)
Admission: EM | Admit: 2012-07-13 | Discharge: 2012-07-13 | Disposition: A | Discharge status: Home / Self Care | Payer: Medicare Other | Attending: Emergency Medicine | Admitting: Emergency Medicine

## 2012-07-13 DIAGNOSIS — E785 Hyperlipidemia, unspecified: Secondary | ICD-10-CM | POA: Insufficient documentation

## 2012-07-13 DIAGNOSIS — Y929 Unspecified place or not applicable: Secondary | ICD-10-CM | POA: Insufficient documentation

## 2012-07-13 DIAGNOSIS — Y939 Activity, unspecified: Secondary | ICD-10-CM | POA: Insufficient documentation

## 2012-07-13 DIAGNOSIS — Z8719 Personal history of other diseases of the digestive system: Secondary | ICD-10-CM | POA: Insufficient documentation

## 2012-07-13 DIAGNOSIS — T189XXA Foreign body of alimentary tract, part unspecified, initial encounter: Secondary | ICD-10-CM | POA: Insufficient documentation

## 2012-07-13 DIAGNOSIS — I10 Essential (primary) hypertension: Secondary | ICD-10-CM | POA: Insufficient documentation

## 2012-07-13 DIAGNOSIS — E119 Type 2 diabetes mellitus without complications: Secondary | ICD-10-CM | POA: Insufficient documentation

## 2012-07-13 DIAGNOSIS — IMO0002 Reserved for concepts with insufficient information to code with codable children: Secondary | ICD-10-CM | POA: Insufficient documentation

## 2012-07-13 DIAGNOSIS — E669 Obesity, unspecified: Secondary | ICD-10-CM | POA: Insufficient documentation

## 2012-07-13 DIAGNOSIS — Z8679 Personal history of other diseases of the circulatory system: Secondary | ICD-10-CM | POA: Insufficient documentation

## 2012-07-13 DIAGNOSIS — Z79899 Other long term (current) drug therapy: Secondary | ICD-10-CM | POA: Insufficient documentation

## 2012-07-13 DIAGNOSIS — Z7982 Long term (current) use of aspirin: Secondary | ICD-10-CM | POA: Insufficient documentation

## 2012-07-13 DIAGNOSIS — N400 Enlarged prostate without lower urinary tract symptoms: Secondary | ICD-10-CM | POA: Insufficient documentation

## 2012-07-13 DIAGNOSIS — K219 Gastro-esophageal reflux disease without esophagitis: Secondary | ICD-10-CM | POA: Insufficient documentation

## 2012-07-13 NOTE — ED Notes (Signed)
Flexible laryngoscope, light source, and ENT cart at bedside.

## 2012-07-13 NOTE — ED Notes (Signed)
Pt is transfer from [facility] hospital via [facility]. Per EMS, pt swallowed a toothpick while drinking coffee at approx 1100 today. Dr. Jenne Pane ENT has been paged. Pt breathing, talking in complete sentences, and is in no distress.

## 2012-07-13 NOTE — ED Notes (Signed)
**Note De-Identified Shakari Qazi Obfuscation** Cab called for pt. Pt ambulatory to waiting room to wait for cab.

## 2012-07-13 NOTE — ED Provider Notes (Signed)
History     CSN: [id number]  Arrival date & time 07/13/12  1945   First MD Initiated Contact with Patient 07/13/12 1949      Chief Complaint  Patient presents with  . Swallowed Foreign Body    (Consider location/radiation/quality/duration/timing/severity/associated sxs/prior treatment) Patient is a 57 y.o. male presenting with foreign body swallowed. The history is provided by the patient.  Swallowed Foreign Body  He accidentally swallowed a toothpick at about 11:30 AM. He felt like it got stuck in his throat. He was seen at the emergency department at [facility] where he was given multiple treatments with no relief. CT was obtained which showed a questionable foreign body and he was transferred here for evaluation by ENT. He states that his throat is sore but is not necessarily worse when he swallows. He does not have an actual foreign body sensation at the moment. He denies difficulty breathing or swallowing.  Past Medical History  Diagnosis Date  . Hypertension   . Hyperlipidemia   . Obesity   . Acid reflux disease   . Gastritis   . BPH (benign prostatic hypertrophy)   . Vertigo   . Normal nuclear stress test Dec 2011  . Type II diabetes mellitus   . H/O hiatal hernia   . TIA (transient ischemic attack) 05/15/2012    "they think I've had one this am @ 0130" (05/15/2012)    Past Surgical History  Procedure Laterality Date  . Cardiovascular stress test  2009    EF 63%  . No past surgeries      Family History  Problem Relation Age of Onset  . Hyperlipidemia Father   . Colon cancer Father   . Stroke Mother     History  Substance Use Topics  . Smoking status: Never Smoker   . Smokeless tobacco: Never Used  . Alcohol Use: No      Review of Systems  All other systems reviewed and are negative.    Allergies  Review of patient's allergies indicates no known allergies.  Home Medications   Current Outpatient Rx  Name  Route  Sig  Dispense  Refill  .  aspirin 325 MG tablet   Oral   Take 1 tablet (325 mg total) by mouth daily.   30 tablet   12   . atenolol (TENORMIN) 100 MG tablet   Oral   Take 100 mg by mouth 2 (two) times daily.           . Biotin 1000 MCG tablet   Oral   Take 1,000 mcg by mouth daily.         . carbamazepine (EPITOL) 200 MG tablet   Oral   Take 200 mg by mouth 2 (two) times daily.         . Cholecalciferol (VITAMIN D3) 5000 UNITS TABS   Oral   Take 5,000 Units by mouth daily.         . Choline Fenofibrate (TRILIPIX) 135 MG capsule   Oral   Take 135 mg by mouth daily.         [name] CINNAMON PO   Oral   Take 1,000 mg by mouth 2 (two) times daily.         [name] ezetimibe (ZETIA) 10 MG tablet   Oral   Take 10 mg by mouth daily.           . Ferrous Sulfate (IRON) 325 (65 FE) MG TABS   Oral   Take  by mouth 2 (two) times daily.           . fish oil-omega-3 fatty acids 1000 MG capsule   Oral   Take 6 g by mouth daily.           . Garlic 1000 MG CAPS   Oral   Take 10,000 mg by mouth daily.         [name] glipiZIDE (GLUCOTROL) 10 MG tablet   Oral   Take 10 mg by mouth 2 (two) times daily before a meal.           . Krill Oil 300 MG CAPS   Oral   Take 300 mg by mouth daily.         [name] lamoTRIgine (LAMICTAL) 200 MG tablet   Oral   Take 200 mg by mouth 2 (two) times daily.         [name] losartan (COZAAR) 100 MG tablet   Oral   Take 1 tablet (100 mg total) by mouth daily.   90 tablet   3   . meclizine (ANTIVERT) 25 MG tablet   Oral   Take 25 mg by mouth 2 (two) times daily.         . metFORMIN (GLUCOPHAGE) 1000 MG tablet   Oral   Take 1,000 mg by mouth 2 (two) times daily with a meal.           . metoCLOPramide (REGLAN) 5 MG tablet   Oral   Take 5 mg by mouth 4 (four) times daily.         [name] oxybutynin (DITROPAN-XL) 5 MG 24 hr tablet   Oral   Take 5 mg by mouth daily.         . pantoprazole (PROTONIX) 40 MG tablet   Oral   Take 40 mg by mouth 2 (two) times daily.           . rosuvastatin (CRESTOR) 40 MG tablet   Oral   Take 40 mg by mouth daily.         . Tamsulosin HCl (FLOMAX) 0.4 MG CAPS   Oral   Take 0.4 mg by mouth 2 (two) times daily.         . vitamin E 400 UNIT capsule   Oral   Take 400 Units by mouth daily.           BP 159/89  Pulse 86  Temp(Src) 99.1 F (37.3 C) (Oral)  Resp 26  SpO2 96%  Physical Exam  Nursing note and vitals reviewed.  57 year old male, resting comfortably and in no acute distress. Vital signs are significant for hypertension with blood pressure 159/89. Oxygen saturation is 96%, which is normal. Head is normocephalic and atraumatic. PERRLA, EOMI. Oropharynx is clear. He is not having any difficulty with secretions and phonation is normal Neck is nontender and supple without adenopathy or JVD. There is no crepitus or stridor. Back is nontender and there is no CVA tenderness. Lungs are clear without rales, wheezes, or rhonchi. Chest is nontender. Heart has regular rate and rhythm without murmur. Abdomen is soft, flat, nontender without masses or hepatosplenomegaly and peristalsis is normoactive. Extremities have no cyanosis or edema, full range of motion is present. Skin is warm and dry without rash. Neurologic: Mental status is normal, cranial nerves are intact, there are no motor or sensory deficits.  ED Course  Procedures (including critical care time)   1. Swallowed foreign body, initial encounter  MDM  Swallowed a wooden foreign body. I reviewed the records that were sent with him from [facility] and he had a CT scan which showed a linear gas density in the posterior pharynx that could conceivably represent a toothpick but no definite foreign body is seen. I've reviewed the images myself. Radiologist recommended direct laryngoscopy. He had normal CBC and basic metabolic panel done at [facility] so these are not repeated. ENT will be consulted.  ENT has seen the  patient and finds no evidence of foreign body. There was a small puncture to the base of the tongue. He will be discharged to resume normal activity and normal diet.     Dione Booze, MD 07/13/12 2153

## 2012-07-13 NOTE — Procedures (Signed)
Preop diagnosis: Pharyngeal foreign body Postop diagnosis: same Procedure: Transnasal fiberoptic laryngoscopy Surgeon: Jenne Pane Anesth: Topical with 1% lidocaine Comp: None Indication: The patient is a 57 year old male who swallowed a toothpick earlier this afternoon.  CT imaging questioned extraluminal air at the left pyriform sinus. Findings: There is no injury seen in the pyriform sinuses or larynx.  There is a streak of old blood with some surrounding redness on the left tongue base.  No foreign body is seen. Description: After discussing risks, benefits, and alternatives, the right nasal passage was sprayed with topical anesthetic using an atomizer.  After a couple of minutes, the flexible laryngoscope was passed through the right nasal passage to view the pharynx and larynx.  Findings are noted above.  The telescope was removed.  The patient tolerated the procedure well without complication. Recs: He can be discharged on regular diet and with no antibiotic.  He can expect a foreign body sensation for a week or so.  Follow-up as needed.

## 2012-07-13 NOTE — Consult Note (Signed)
Reason for Consult:pharyngeal foreign body Referring Physician: ER  Robert Walters is an 57 y.o. male.  HPI: 57 year old male was sucking on a small toothpick earlier this afternoon and took a drink from a cup, forgetting about his toothpick, and swallowed his toothpick.  He could feel it catch in his throat and he tried to gag it up without success.  He had no airway distress or hoarseness.  He did not see any blood from his throat.  He went to the ER at [facility] where a plain x-ray of the neck and neck CT were performed.  The CT was read as having possible extraluminal air on the left near the pyriform sinus.  He was transferred for laryngoscopy.  He says that he could feel the foreign body sticking in either side when he turned his head one way or the other.  At one point, while in the ambulance, he may have felt the foreign body go down with a swallow and the sticking feeling quit.  His throat remains sore on the left side.  Past Medical History  Diagnosis Date  . Hypertension   . Hyperlipidemia   . Obesity   . Acid reflux disease   . Gastritis   . BPH (benign prostatic hypertrophy)   . Vertigo   . Normal nuclear stress test Dec 2011  . Type II diabetes mellitus   . H/O hiatal hernia   . TIA (transient ischemic attack) 05/15/2012    "they think I've had one this am @ 0130" (05/15/2012)    Past Surgical History  Procedure Laterality Date  . Cardiovascular stress test  2009    EF 63%  . No past surgeries      Family History  Problem Relation Age of Onset  . Hyperlipidemia Father   . Colon cancer Father   . Stroke Mother     Social History:  reports that he has never smoked. He has never used smokeless tobacco. He reports that he does not drink alcohol or use illicit drugs.  Allergies: No Known Allergies  Medications: I have reviewed the patient's current medications.  No results found for this or any previous visit (from the past 48 hour(s)).  No results found.  Review  of Systems  HENT: Positive for sore throat.   All other systems reviewed and are negative.   Blood pressure 159/89, pulse 86, temperature 99.1 F (37.3 C), temperature source Oral, resp. rate 26, SpO2 96.00%. Physical Exam  Constitutional: He is oriented to person, place, and time. He appears well-developed and well-nourished. No distress.  HENT:  Head: Normocephalic and atraumatic.  Right Ear: External ear normal.  Left Ear: External ear normal.  Nose: Nose normal.  Mouth/Throat: Oropharynx is clear and moist.  Eyes: Conjunctivae and EOM are normal. Pupils are equal, round, and reactive to light.  Neck: Normal range of motion. Neck supple.  Mild left upper neck tenderness.  Cardiovascular: Normal rate.   Respiratory: Effort normal.  GI:  Did not examine.  Genitourinary:  Did not examine.  Musculoskeletal: Normal range of motion.  Neurological: He is alert and oriented to person, place, and time. No cranial nerve deficit.  Skin: Skin is warm and dry.  Psychiatric: He has a normal mood and affect. His behavior is normal. Judgment and thought content normal.    Assessment/Plan: Pharyngeal foreign body I personally reviewed the neck CT performed at [facility].  The area of concern at the left pyriform sinus is intraluminal air  and looks asymmetric due to positioning and secretions in the pharynx.  I recommended and performed laryngoscopy.  See procedure note.  It appears on laryngoscopy that the foreign body had lodged in the left tongue base but is no longer present.  He was reassured and told that the soreness and foreign body feeling would likely last for up to a week.  He was told to call with any worsening, fever, etc. He can resume a regular diet.  He does not need antibiotic therapy.  BATES, DWIGHT 07/13/2012, 9:17 PM

## 2012-07-13 NOTE — ED Notes (Signed)
EDP at bedside  

## 2012-07-13 NOTE — ED Notes (Signed)
ENT at bedside

## 2012-08-06 ENCOUNTER — Emergency Department (HOSPITAL_COMMUNITY)
Admission: EM | Admit: 2012-08-06 | Discharge: 2012-08-06 | Disposition: A | Discharge status: Home / Self Care | Payer: Medicare Other | Attending: Emergency Medicine | Admitting: Emergency Medicine

## 2012-08-06 ENCOUNTER — Encounter (HOSPITAL_COMMUNITY): Payer: Self-pay

## 2012-08-06 DIAGNOSIS — K219 Gastro-esophageal reflux disease without esophagitis: Secondary | ICD-10-CM | POA: Insufficient documentation

## 2012-08-06 DIAGNOSIS — Z8673 Personal history of transient ischemic attack (TIA), and cerebral infarction without residual deficits: Secondary | ICD-10-CM | POA: Insufficient documentation

## 2012-08-06 DIAGNOSIS — H9319 Tinnitus, unspecified ear: Secondary | ICD-10-CM | POA: Insufficient documentation

## 2012-08-06 DIAGNOSIS — Z8719 Personal history of other diseases of the digestive system: Secondary | ICD-10-CM | POA: Insufficient documentation

## 2012-08-06 DIAGNOSIS — H9311 Tinnitus, right ear: Secondary | ICD-10-CM

## 2012-08-06 DIAGNOSIS — E119 Type 2 diabetes mellitus without complications: Secondary | ICD-10-CM | POA: Insufficient documentation

## 2012-08-06 DIAGNOSIS — E669 Obesity, unspecified: Secondary | ICD-10-CM | POA: Insufficient documentation

## 2012-08-06 DIAGNOSIS — E785 Hyperlipidemia, unspecified: Secondary | ICD-10-CM | POA: Insufficient documentation

## 2012-08-06 DIAGNOSIS — Z7982 Long term (current) use of aspirin: Secondary | ICD-10-CM | POA: Insufficient documentation

## 2012-08-06 DIAGNOSIS — I10 Essential (primary) hypertension: Secondary | ICD-10-CM | POA: Insufficient documentation

## 2012-08-06 DIAGNOSIS — R110 Nausea: Secondary | ICD-10-CM | POA: Insufficient documentation

## 2012-08-06 DIAGNOSIS — Z8669 Personal history of other diseases of the nervous system and sense organs: Secondary | ICD-10-CM | POA: Insufficient documentation

## 2012-08-06 DIAGNOSIS — Z79899 Other long term (current) drug therapy: Secondary | ICD-10-CM | POA: Insufficient documentation

## 2012-08-06 LAB — SALICYLATE LEVEL: Salicylate Lvl: <2 mg/dL — ABNORMAL LOW (ref 2.8–20.0)

## 2012-08-06 MED ORDER — ONDANSETRON HCL 4 MG PO TABS: 4.0000 mg | ORAL_TABLET | Freq: Three times a day (TID) | ORAL | Status: DC | PRN

## 2012-08-06 MED ORDER — HYDROCODONE-ACETAMINOPHEN 5-325 MG PO TABS: 1.0000 | ORAL_TABLET | ORAL | Status: DC | PRN

## 2012-08-06 NOTE — ED Provider Notes (Signed)
History     CSN: [id number]  Arrival date & time 08/06/12  0848   First MD Initiated Contact with Patient 08/06/12 [phone]      Chief Complaint  Patient presents with  . noise in ear     (Consider location/radiation/quality/duration/timing/severity/associated sxs/prior treatment) HPI CHANC KERVIN is a 57 y.o. male who presents to the ED with ear problems. He states that for the past several days he hears a hissing noise in his right ear. The ear is not painful but the noise really bothers him. He has recently increased his Aspirin from 81 mg to 325 mg every night. He has had nausea for the past 2 days. Nothing else has changed.   Past Medical History  Diagnosis Date  . Hypertension   . Hyperlipidemia   . Obesity   . Acid reflux disease   . Gastritis   . BPH (benign prostatic hypertrophy)   . Vertigo   . Normal nuclear stress test Dec 2011  . Type II diabetes mellitus   . H/O hiatal hernia   . TIA (transient ischemic attack) 05/15/2012    "they think I've had one this am @ 0130" (05/15/2012)    Past Surgical History  Procedure Laterality Date  . Cardiovascular stress test  2009    EF 63%  . No past surgeries      Family History  Problem Relation Age of Onset  . Hyperlipidemia Father   . Colon cancer Father   . Stroke Mother     History  Substance Use Topics  . Smoking status: Never Smoker   . Smokeless tobacco: Never Used  . Alcohol Use: No      Review of Systems  Constitutional: Negative for fever, chills and activity change.  HENT: Negative for neck pain. Ear pain: hissing noise.   Respiratory: Negative for shortness of breath.   Cardiovascular: Negative for chest pain.  Gastrointestinal: Positive for nausea.  Skin: Negative for rash.  Psychiatric/Behavioral: Negative for confusion. The patient is not nervous/anxious.     Allergies  Review of patient's allergies indicates no known allergies.  Home Medications   Current Outpatient Rx  Name  Route   Sig  Dispense  Refill  . aspirin 325 MG tablet   Oral   Take 1 tablet (325 mg total) by mouth daily.   30 tablet   12   . atenolol (TENORMIN) 100 MG tablet   Oral   Take 100 mg by mouth 2 (two) times daily.           . Biotin 1000 MCG tablet   Oral   Take 1,000 mcg by mouth daily.         . carbamazepine (EPITOL) 200 MG tablet   Oral   Take 200 mg by mouth 2 (two) times daily.         . Cholecalciferol (VITAMIN D3) 5000 UNITS TABS   Oral   Take 5,000 Units by mouth daily.         [name] CINNAMON PO   Oral   Take 1,000 mg by mouth 2 (two) times daily.         [name] ezetimibe (ZETIA) 10 MG tablet   Oral   Take 10 mg by mouth daily.           . Ferrous Sulfate (IRON) 325 (65 FE) MG TABS   Oral   Take by mouth 2 (two) times daily.           [name]  fish oil-omega-3 fatty acids 1000 MG capsule   Oral   Take 3 g by mouth 2 (two) times daily.          . Garlic 1000 MG CAPS   Oral   Take 10,000 mg by mouth daily.         [name] glipiZIDE (GLUCOTROL) 10 MG tablet   Oral   Take 10 mg by mouth 2 (two) times daily before a meal.           . Krill Oil 300 MG CAPS   Oral   Take 300 mg by mouth daily.         [name] lamoTRIgine (LAMICTAL) 200 MG tablet   Oral   Take 200 mg by mouth 2 (two) times daily.         [name] losartan (COZAAR) 100 MG tablet   Oral   Take 1 tablet (100 mg total) by mouth daily.   90 tablet   3   . meclizine (ANTIVERT) 25 MG tablet   Oral   Take 25 mg by mouth 2 (two) times daily.         . metFORMIN (GLUCOPHAGE) 1000 MG tablet   Oral   Take 1,000 mg by mouth 2 (two) times daily with a meal.           . metoCLOPramide (REGLAN) 5 MG tablet   Oral   Take 5 mg by mouth 4 (four) times daily.         [name] oxybutynin (DITROPAN-XL) 5 MG 24 hr tablet   Oral   Take 5 mg by mouth daily.         . pantoprazole (PROTONIX) 40 MG tablet   Oral   Take 40 mg by mouth 2 (two) times daily.          . rosuvastatin (CRESTOR) 40 MG tablet   Oral    Take 40 mg by mouth daily.         . Tamsulosin HCl (FLOMAX) 0.4 MG CAPS   Oral   Take 0.4 mg by mouth 2 (two) times daily.         . vitamin E 400 UNIT capsule   Oral   Take 400 Units by mouth daily.           BP 130/92  Pulse 79  Temp(Src) 97.7 F (36.5 C) (Oral)  Resp 20  Ht 5\' 9"  (1.753 m)  Wt 235 lb (106.595 kg)  BMI 34.69 kg/m2  SpO2 97%  Physical Exam  Nursing note and vitals reviewed. Constitutional: He is oriented to person, place, and time. He appears well-developed and well-nourished. No distress.  HENT:  Head: Normocephalic.  Right Ear: Tympanic membrane and ear canal normal.  Left Ear: Tympanic membrane and ear canal normal.  Eyes: EOM are normal.  Neck: Neck supple.  Cardiovascular: Normal rate.   Pulmonary/Chest: Effort normal.  Abdominal: Soft. There is no tenderness.  Musculoskeletal: Normal range of motion.  Neurological: He is alert and oriented to person, place, and time. No cranial nerve deficit.  Skin: Skin is warm and dry.  Psychiatric: He has a normal mood and affect. His behavior is normal. Judgment and thought content normal.    ED Course  Procedures (including critical care time)   MDM  57 y.o. male with ringing and buzzing in his right ear. Aspirin level is not elevated. ENT exam is normal. I have discussed clinical findings and plan of care with the patient. He has an  ENT doctor and will call in the morning for an appointment. Discussed turning radio or TV louder to decrease his symptoms. The patient is stable for discharge home.         [facility] Orlene Och, NP 08/06/12 1050

## 2012-08-06 NOTE — ED Notes (Signed)
Pt reports hearing a "hissing" noise in R ear off and on for past several days.  Denies pain but says the noise is very irritating.

## 2012-08-06 NOTE — ED Provider Notes (Addendum)
57 year old male with history noise in his right ear for the last several days. Was initially intermittent is now become constant. He is also complaining of some vague abdominal distress and nausea. He does take aspirin but a low-dose. Salicylate level is not toxic. He has an ENT physician in evening is advised to followup with that person. Because of complaints of vague abdominal pain and nausea, he is given prescriptions for tramadol and Zofran.  Medical screening examination/treatment/procedure(s) were conducted as a shared visit with non-physician practitioner(s) and myself.  I personally evaluated the patient during the encounter  Dione Booze, MD 08/06/12 1052  Dione Booze, MD 08/06/12 1137

## 2012-08-06 NOTE — ED Notes (Signed)
Pt requested to speak to [facility] again before discharge. [facility] notified. Requested that dr. Preston Fleeting speak to pt. Dr. Preston Fleeting then notified.

## 2012-08-06 NOTE — ED Notes (Signed)
Feeling dizziness and nauseous

## 2012-08-15 ENCOUNTER — Emergency Department (HOSPITAL_COMMUNITY)
Admission: EM | Admit: 2012-08-15 | Discharge: 2012-08-15 | Disposition: A | Discharge status: Home / Self Care | Payer: Medicare Other | Attending: Emergency Medicine | Admitting: Emergency Medicine

## 2012-08-15 ENCOUNTER — Encounter (HOSPITAL_COMMUNITY): Payer: Self-pay | Admitting: *Deleted

## 2012-08-15 DIAGNOSIS — K219 Gastro-esophageal reflux disease without esophagitis: Secondary | ICD-10-CM | POA: Insufficient documentation

## 2012-08-15 DIAGNOSIS — I10 Essential (primary) hypertension: Secondary | ICD-10-CM | POA: Insufficient documentation

## 2012-08-15 DIAGNOSIS — E785 Hyperlipidemia, unspecified: Secondary | ICD-10-CM | POA: Insufficient documentation

## 2012-08-15 DIAGNOSIS — Z8673 Personal history of transient ischemic attack (TIA), and cerebral infarction without residual deficits: Secondary | ICD-10-CM | POA: Insufficient documentation

## 2012-08-15 DIAGNOSIS — Z79899 Other long term (current) drug therapy: Secondary | ICD-10-CM | POA: Insufficient documentation

## 2012-08-15 DIAGNOSIS — R42 Dizziness and giddiness: Secondary | ICD-10-CM | POA: Insufficient documentation

## 2012-08-15 DIAGNOSIS — Z7982 Long term (current) use of aspirin: Secondary | ICD-10-CM | POA: Insufficient documentation

## 2012-08-15 DIAGNOSIS — Z8719 Personal history of other diseases of the digestive system: Secondary | ICD-10-CM | POA: Insufficient documentation

## 2012-08-15 DIAGNOSIS — H9313 Tinnitus, bilateral: Secondary | ICD-10-CM

## 2012-08-15 DIAGNOSIS — E119 Type 2 diabetes mellitus without complications: Secondary | ICD-10-CM | POA: Insufficient documentation

## 2012-08-15 DIAGNOSIS — N400 Enlarged prostate without lower urinary tract symptoms: Secondary | ICD-10-CM | POA: Insufficient documentation

## 2012-08-15 DIAGNOSIS — E669 Obesity, unspecified: Secondary | ICD-10-CM | POA: Insufficient documentation

## 2012-08-15 DIAGNOSIS — R259 Unspecified abnormal involuntary movements: Secondary | ICD-10-CM | POA: Insufficient documentation

## 2012-08-15 DIAGNOSIS — H9319 Tinnitus, unspecified ear: Secondary | ICD-10-CM | POA: Insufficient documentation

## 2012-08-15 LAB — POCT I-STAT, CHEM 8
BUN: 31 mg/dL — ABNORMAL HIGH (ref 6–23)
Calcium, Ion: 1.14 mmol/L (ref 1.12–1.23)
Creatinine, Ser: 1 mg/dL (ref 0.50–1.35)
TCO2: 19 mmol/L (ref 0–100)

## 2012-08-15 MED ORDER — ZOLPIDEM TARTRATE 10 MG PO TABS: 10.0000 mg | ORAL_TABLET | Freq: Every evening | ORAL | Status: DC | PRN

## 2012-08-15 MED ORDER — LORAZEPAM 2 MG/ML IJ SOLN
1.0000 mg | Freq: Once | INTRAMUSCULAR | Status: AC
  Administered 2012-08-15: 1 mg via INTRAVENOUS
  Filled 2012-08-15: qty 1

## 2012-08-15 MED ORDER — ONDANSETRON 4 MG PO TBDP
4.0000 mg | ORAL_TABLET | Freq: Once | ORAL | Status: AC
  Administered 2012-08-15: 4 mg via ORAL
  Filled 2012-08-15: qty 1

## 2012-08-15 MED ORDER — ALPRAZOLAM 1 MG PO TABS: 1.0000 mg | ORAL_TABLET | Freq: Three times a day (TID) | ORAL | Status: DC | PRN

## 2012-08-15 NOTE — ED Provider Notes (Signed)
History     CSN: [id number]  Arrival date & time 08/15/12  1330   First MD Initiated Contact with Patient 08/15/12 1501      Chief Complaint  Patient presents with  . Dizziness  . Tinnitus     HPI Patient presents with chief complaint of tinnitus and dizziness.  Patient had these symptoms for several months now.  He seen an ENT doctor.  The symptoms are unremitting and unrelenting.  Preventing him from sleeping.  Some dizziness. Past Medical History  Diagnosis Date  . Hypertension   . Hyperlipidemia   . Obesity   . Acid reflux disease   . Gastritis   . BPH (benign prostatic hypertrophy)   . Vertigo   . Normal nuclear stress test Dec 2011  . Type II diabetes mellitus   . H/O hiatal hernia   . TIA (transient ischemic attack) 05/15/2012    "they think I've had one this am @ 0130" (05/15/2012)    Past Surgical History  Procedure Laterality Date  . Cardiovascular stress test  2009    EF 63%  . No past surgeries      Family History  Problem Relation Age of Onset  . Hyperlipidemia Father   . Colon cancer Father   . Stroke Mother     History  Substance Use Topics  . Smoking status: Never Smoker   . Smokeless tobacco: Never Used  . Alcohol Use: No      Review of Systems All other systems reviewed and are negative Allergies  Review of patient's allergies indicates no known allergies.  Home Medications   Current Outpatient Rx  Name  Route  Sig  Dispense  Refill  . ALPRAZolam (XANAX) 0.5 MG tablet   Oral   Take 0.25 mg by mouth 3 (three) times daily.         [name] aspirin 325 MG tablet   Oral   Take 1 tablet (325 mg total) by mouth daily.   30 tablet   12   . atenolol (TENORMIN) 100 MG tablet   Oral   Take 100 mg by mouth 2 (two) times daily.           . carbamazepine (EPITOL) 200 MG tablet   Oral   Take 200 mg by mouth 2 (two) times daily.         . Cholecalciferol (VITAMIN D3) 5000 UNITS TABS   Oral   Take 5,000 Units by mouth daily.          [name] ezetimibe (ZETIA) 10 MG tablet   Oral   Take 10 mg by mouth daily.           [name] FLUoxetine (PROZAC) 20 MG capsule   Oral   Take 20 mg by mouth daily.         . Garlic 1000 MG CAPS   Oral   Take 10,000 mg by mouth daily.         [name] glipiZIDE (GLUCOTROL) 10 MG tablet   Oral   Take 10 mg by mouth 2 (two) times daily before a meal.           . HYDROcodone-acetaminophen (NORCO/VICODIN) 5-325 MG per tablet   Oral   Take 1 tablet by mouth every 4 (four) hours as needed for pain.   15 tablet   0   . lamoTRIgine (LAMICTAL) 200 MG tablet   Oral   Take 200 mg by mouth 2 (two) times daily.         [name]  losartan (COZAAR) 100 MG tablet   Oral   Take 1 tablet (100 mg total) by mouth daily.   90 tablet   3   . meclizine (ANTIVERT) 25 MG tablet   Oral   Take 25 mg by mouth 2 (two) times daily.         . metFORMIN (GLUCOPHAGE) 1000 MG tablet   Oral   Take 1,000 mg by mouth 2 (two) times daily with a meal.           . metoCLOPramide (REGLAN) 5 MG tablet   Oral   Take 5 mg by mouth 4 (four) times daily.         . ondansetron (ZOFRAN) 4 MG tablet   Oral   Take 1 tablet (4 mg total) by mouth every 8 (eight) hours as needed for nausea.   20 tablet   0   . oxybutynin (DITROPAN-XL) 5 MG 24 hr tablet   Oral   Take 5 mg by mouth daily.         . pantoprazole (PROTONIX) 40 MG tablet   Oral   Take 40 mg by mouth 2 (two) times daily.          . rosuvastatin (CRESTOR) 40 MG tablet   Oral   Take 40 mg by mouth daily.         . Tamsulosin HCl (FLOMAX) 0.4 MG CAPS   Oral   Take 0.4 mg by mouth 2 (two) times daily.         [name] ALPRAZolam (XANAX) 1 MG tablet   Oral   Take 1 tablet (1 mg total) by mouth 3 (three) times daily as needed for anxiety.   30 tablet   0   . zolpidem (AMBIEN) 10 MG tablet   Oral   Take 1 tablet (10 mg total) by mouth at bedtime as needed for sleep.   30 tablet   0     BP 139/89  Pulse 68  Temp(Src) 98.7 F (37.1 C) (Oral)   Resp 22  SpO2 96%  Physical Exam  Nursing note and vitals reviewed. Constitutional: He is oriented to person, place, and time. He appears well-developed and well-nourished. No distress.  HENT:  Head: Normocephalic and atraumatic.  Eyes: Pupils are equal, round, and reactive to light.  Neck: Normal range of motion.  Cardiovascular: Normal rate and intact distal pulses.   Pulmonary/Chest: No respiratory distress.  Abdominal: Normal appearance. He exhibits no distension.  Musculoskeletal: Normal range of motion.  Neurological: He is alert and oriented to person, place, and time. He displays tremor. No cranial nerve deficit. GCS eye subscore is 4. GCS verbal subscore is 5. GCS motor subscore is 6.  Skin: Skin is warm and dry. No rash noted.  Psychiatric: He has a normal mood and affect. His behavior is normal.    ED Course  Procedures (including critical care time) Meds ordered this encounter  Medications  . ondansetron (ZOFRAN-ODT) disintegrating tablet 4 mg    Sig:   . LORazepam (ATIVAN) injection 1 mg    Sig:     Labs Reviewed  POCT I-STAT, CHEM 8 - Abnormal; Notable for the following:    BUN 31 (*)    Glucose, Bld 118 (*)    All other components within normal limits   No results found.   1. Tinnitus, bilateral       MDM  After treatment in the ED the patient feels back to baseline and wants to go  home.  Follow up appointment has been made for tomorrow with otolaryngology.      Nelia Shi, MD 08/15/12 [phone]

## 2012-08-15 NOTE — ED Notes (Addendum)
Pt reports ringing in his ears, sts seen ENT and was told there is nothing they can do to help him. Pt reports having panic attacks with this, pt is hyperventilating and sts that happens every time when the ringing is severe; instructed pt multiple times to slow down his breathing; pt sts unable to eat or drink d/t severity of his symptoms. Pt also reports feeling little bit dizzy. Pt appears very anxious.

## 2012-08-15 NOTE — ED Notes (Signed)
Per EMS pt coming from home with c/o bilateral tinnitus and vertigo, pt called his PCP and was told to come to ED for eval.

## 2012-08-15 NOTE — ED Notes (Signed)
[id number]> Expected date:<BR> Expected time:<BR> Means of arrival:<BR> Comments:<BR>  EMS,vertigo

## 2012-08-21 ENCOUNTER — Encounter (HOSPITAL_COMMUNITY): Payer: Self-pay | Admitting: *Deleted

## 2012-08-21 ENCOUNTER — Encounter: Payer: Self-pay | Admitting: Emergency Medicine

## 2012-08-21 ENCOUNTER — Emergency Department (HOSPITAL_COMMUNITY)
Admission: EM | Admit: 2012-08-21 | Discharge: 2012-08-21 | Disposition: A | Discharge status: Home / Self Care | Payer: Medicare Other | Attending: Emergency Medicine | Admitting: Emergency Medicine

## 2012-08-21 ENCOUNTER — Emergency Department (HOSPITAL_COMMUNITY): Payer: Medicare Other

## 2012-08-21 DIAGNOSIS — R112 Nausea with vomiting, unspecified: Secondary | ICD-10-CM | POA: Insufficient documentation

## 2012-08-21 DIAGNOSIS — Z8673 Personal history of transient ischemic attack (TIA), and cerebral infarction without residual deficits: Secondary | ICD-10-CM | POA: Insufficient documentation

## 2012-08-21 DIAGNOSIS — K219 Gastro-esophageal reflux disease without esophagitis: Secondary | ICD-10-CM | POA: Insufficient documentation

## 2012-08-21 DIAGNOSIS — H9319 Tinnitus, unspecified ear: Secondary | ICD-10-CM | POA: Insufficient documentation

## 2012-08-21 DIAGNOSIS — H9311 Tinnitus, right ear: Secondary | ICD-10-CM

## 2012-08-21 DIAGNOSIS — F411 Generalized anxiety disorder: Secondary | ICD-10-CM | POA: Insufficient documentation

## 2012-08-21 DIAGNOSIS — E119 Type 2 diabetes mellitus without complications: Secondary | ICD-10-CM | POA: Insufficient documentation

## 2012-08-21 DIAGNOSIS — E669 Obesity, unspecified: Secondary | ICD-10-CM | POA: Insufficient documentation

## 2012-08-21 DIAGNOSIS — E785 Hyperlipidemia, unspecified: Secondary | ICD-10-CM | POA: Insufficient documentation

## 2012-08-21 DIAGNOSIS — Z8719 Personal history of other diseases of the digestive system: Secondary | ICD-10-CM | POA: Insufficient documentation

## 2012-08-21 DIAGNOSIS — R450 Nervousness: Secondary | ICD-10-CM | POA: Insufficient documentation

## 2012-08-21 DIAGNOSIS — R109 Unspecified abdominal pain: Secondary | ICD-10-CM | POA: Insufficient documentation

## 2012-08-21 DIAGNOSIS — Z79899 Other long term (current) drug therapy: Secondary | ICD-10-CM | POA: Insufficient documentation

## 2012-08-21 DIAGNOSIS — N400 Enlarged prostate without lower urinary tract symptoms: Secondary | ICD-10-CM | POA: Insufficient documentation

## 2012-08-21 DIAGNOSIS — F410 Panic disorder [episodic paroxysmal anxiety] without agoraphobia: Secondary | ICD-10-CM

## 2012-08-21 DIAGNOSIS — Z8669 Personal history of other diseases of the nervous system and sense organs: Secondary | ICD-10-CM | POA: Insufficient documentation

## 2012-08-21 LAB — CBC WITH DIFFERENTIAL/PLATELET
Basophils Absolute: 0 10*3/uL (ref 0.0–0.1)
Basophils Relative: 0 % (ref 0–1)
Eosinophils Absolute: 0.2 10*3/uL (ref 0.0–0.7)
Eosinophils Relative: 2 % (ref 0–5)
MCH: 31 pg (ref 26.0–34.0)
MCV: 85.4 fL (ref 78.0–100.0)
Platelets: 159 10*3/uL (ref 150–400)
RDW: 13.9 % (ref 11.5–15.5)

## 2012-08-21 LAB — URINALYSIS, ROUTINE W REFLEX MICROSCOPIC
Hgb urine dipstick: NEGATIVE
Specific Gravity, Urine: 1.033 — ABNORMAL HIGH (ref 1.005–1.030)
Urobilinogen, UA: 0.2 mg/dL (ref 0.0–1.0)

## 2012-08-21 LAB — COMPREHENSIVE METABOLIC PANEL
ALT: 18 U/L (ref 0–53)
AST: 18 U/L (ref 0–37)
Calcium: 9.8 mg/dL (ref 8.4–10.5)
GFR calc Af Amer: 56 mL/min — ABNORMAL LOW (ref >90)
Sodium: 138 mEq/L (ref 135–145)
Total Protein: 7.2 g/dL (ref 6.0–8.3)

## 2012-08-21 LAB — GLUCOSE, CAPILLARY: Glucose-Capillary: 92 mg/dL (ref 70–99)

## 2012-08-21 LAB — URINE MICROSCOPIC-ADD ON

## 2012-08-21 MED ORDER — LORAZEPAM 1 MG PO TABS
1.0000 mg | ORAL_TABLET | Freq: Once | ORAL | Status: AC
  Administered 2012-08-21: 1 mg via ORAL
  Filled 2012-08-21: qty 1

## 2012-08-21 MED ORDER — SODIUM CHLORIDE 0.9 % IV BOLUS (SEPSIS)
1000.0000 mL | Freq: Once | INTRAVENOUS | Status: AC
  Administered 2012-08-21: 1000 mL via INTRAVENOUS

## 2012-08-21 MED ORDER — IOHEXOL 300 MG/ML  SOLN: 50.0000 mL | Freq: Once | INTRAMUSCULAR | Status: DC | PRN

## 2012-08-21 MED ORDER — HYDROXYZINE HCL 25 MG PO TABS: 25.0000 mg | ORAL_TABLET | Freq: Four times a day (QID) | ORAL | Status: DC | PRN

## 2012-08-21 MED ORDER — IOHEXOL 300 MG/ML  SOLN
100.0000 mL | Freq: Once | INTRAMUSCULAR | Status: AC | PRN
  Administered 2012-08-21: 100 mL via INTRAVENOUS

## 2012-08-21 MED ORDER — LORAZEPAM 2 MG/ML IJ SOLN
0.5000 mg | Freq: Once | INTRAMUSCULAR | Status: AC
  Administered 2012-08-21: 0.5 mg via INTRAVENOUS
  Filled 2012-08-21: qty 1

## 2012-08-21 MED ORDER — ONDANSETRON HCL 4 MG/2ML IJ SOLN
4.0000 mg | Freq: Once | INTRAMUSCULAR | Status: AC
  Administered 2012-08-21: 4 mg via INTRAVENOUS
  Filled 2012-08-21: qty 2

## 2012-08-21 NOTE — ED Notes (Signed)
Pt reports having ringing in his ears for past month. States that he has had nausea, vomiting, and panic attacks since developing ringing in his ears. States that he has lost 38lbs in the last 2 weeks, as well.

## 2012-08-21 NOTE — ED Notes (Signed)
Patient asked for urine sample. States that he is unable to give sample at this time. 

## 2012-08-21 NOTE — ED Notes (Signed)
Pt states that he has been having abdominal pain, vomiting, some diarrhea, no constipation, and ringing in ears for 3 weeks.  Pt reports 25-30lb weight loss since.

## 2012-08-21 NOTE — ED Provider Notes (Signed)
History     CSN: [id number]  Arrival date & time 08/21/12  1304   First MD Initiated Contact with Patient 08/21/12 1703      Chief Complaint  Patient presents with  . Abdominal Pain  . Emesis  . Weight Loss    (Consider location/radiation/quality/duration/timing/severity/associated sxs/prior treatment) HPI Comments: Patient comes to the ER for evaluation of nausea and poor appetite. Patient reports that symptoms have been present for 3 weeks. He reports nearly 30 pound weight loss over this period of time. Patient denies abdominal pain. He says he simply is not hungry he can't eat because of nausea. He also reports increased symptoms of tinnitus in the right ear that has coincided with the onset of the symptoms. This is been going on for sometime and has been seen in the ER as well as by ENT for the tinnitus. He was told nothing to be done. Patient reports he has been having increased anxiety and panic attacks since the ringing in the ears worsened. Ringing is persistent, constant and severe. Nausea worsens when he eats. Symptoms of increased respiratory rate and increased anxiety occur sporadically.  Patient is a 57 y.o. male presenting with abdominal pain and vomiting.  Abdominal Pain Associated symptoms: nausea   Associated symptoms: no vomiting   Emesis   Past Medical History  Diagnosis Date  . Hypertension   . Hyperlipidemia   . Obesity   . Acid reflux disease   . Gastritis   . BPH (benign prostatic hypertrophy)   . Vertigo   . Normal nuclear stress test Dec 2011  . Type II diabetes mellitus   . H/O hiatal hernia   . TIA (transient ischemic attack) 05/15/2012    "they think I've had one this am @ 0130" (05/15/2012)    Past Surgical History  Procedure Laterality Date  . Cardiovascular stress test  2009    EF 63%  . No past surgeries      Family History  Problem Relation Age of Onset  . Hyperlipidemia Father   . Colon cancer Father   . Stroke Mother      History  Substance Use Topics  . Smoking status: Never Smoker   . Smokeless tobacco: Never Used  . Alcohol Use: No      Review of Systems  Constitutional: Positive for appetite change.  HENT: Positive for tinnitus.   Gastrointestinal: Positive for nausea. Negative for vomiting.  Psychiatric/Behavioral: The patient is nervous/anxious.   All other systems reviewed and are negative.    Allergies  Review of patient's allergies indicates no known allergies.  Home Medications   Current Outpatient Rx  Name  Route  Sig  Dispense  Refill  . ALPRAZolam (XANAX) 0.5 MG tablet   Oral   Take 0.25 mg by mouth 3 (three) times daily.         [name] ALPRAZolam (XANAX) 1 MG tablet   Oral   Take 1 tablet (1 mg total) by mouth 3 (three) times daily as needed for anxiety.   30 tablet   0   . aspirin 325 MG tablet   Oral   Take 1 tablet (325 mg total) by mouth daily.   30 tablet   12   . atenolol (TENORMIN) 100 MG tablet   Oral   Take 100 mg by mouth 2 (two) times daily.           . carbamazepine (EPITOL) 200 MG tablet   Oral   Take 200 mg by  mouth 2 (two) times daily.         . Cholecalciferol (VITAMIN D3) 5000 UNITS TABS   Oral   Take 5,000 Units by mouth daily.         [name] ezetimibe (ZETIA) 10 MG tablet   Oral   Take 10 mg by mouth daily.           [name] FLUoxetine (PROZAC) 20 MG capsule   Oral   Take 20 mg by mouth daily.         . Garlic 1000 MG CAPS   Oral   Take 10,000 mg by mouth daily.         [name] glipiZIDE (GLUCOTROL) 10 MG tablet   Oral   Take 10 mg by mouth 2 (two) times daily before a meal.           . HYDROcodone-acetaminophen (NORCO/VICODIN) 5-325 MG per tablet   Oral   Take 1 tablet by mouth every 4 (four) hours as needed for pain.   15 tablet   0   . lamoTRIgine (LAMICTAL) 200 MG tablet   Oral   Take 200 mg by mouth 2 (two) times daily.         [name] losartan (COZAAR) 100 MG tablet   Oral   Take 1 tablet (100 mg total) by mouth  daily.   90 tablet   3   . meclizine (ANTIVERT) 25 MG tablet   Oral   Take 25 mg by mouth 2 (two) times daily.         . metFORMIN (GLUCOPHAGE) 1000 MG tablet   Oral   Take 1,000 mg by mouth 2 (two) times daily with a meal.           . metoCLOPramide (REGLAN) 5 MG tablet   Oral   Take 5 mg by mouth 4 (four) times daily.         . ondansetron (ZOFRAN) 4 MG tablet   Oral   Take 1 tablet (4 mg total) by mouth every 8 (eight) hours as needed for nausea.   20 tablet   0   . oxybutynin (DITROPAN-XL) 5 MG 24 hr tablet   Oral   Take 5 mg by mouth daily.         . pantoprazole (PROTONIX) 40 MG tablet   Oral   Take 40 mg by mouth 2 (two) times daily.          . rosuvastatin (CRESTOR) 40 MG tablet   Oral   Take 40 mg by mouth daily.         . Tamsulosin HCl (FLOMAX) 0.4 MG CAPS   Oral   Take 0.4 mg by mouth 2 (two) times daily.         [name] zolpidem (AMBIEN) 10 MG tablet   Oral   Take 1 tablet (10 mg total) by mouth at bedtime as needed for sleep.   30 tablet   0     BP 121/78  Pulse 62  Temp(Src) 96.9 F (36.1 C) (Axillary)  Resp 20  SpO2 97%  Physical Exam  Constitutional: He is oriented to person, place, and time. He appears well-developed and well-nourished. No distress.  HENT:  Head: Normocephalic and atraumatic.  Right Ear: Hearing normal.  Nose: Nose normal.  Mouth/Throat: Oropharynx is clear and moist and mucous membranes are normal.  Eyes: Conjunctivae and EOM are normal. Pupils are equal, round, and reactive to light.  Neck: Normal range of motion. Neck supple.  Cardiovascular: Normal rate, regular rhythm, S1 normal and S2 normal.  Exam reveals no gallop and no friction rub.   No murmur heard. Pulmonary/Chest: Effort normal and breath sounds normal. No respiratory distress. He exhibits no tenderness.  Abdominal: Soft. Normal appearance and bowel sounds are normal. There is no hepatosplenomegaly. There is no tenderness. There is no rebound, no  guarding, no tenderness at McBurney's point and negative Murphy's sign. No hernia.  Musculoskeletal: Normal range of motion.  Neurological: He is alert and oriented to person, place, and time. He has normal strength. No cranial nerve deficit or sensory deficit. Coordination normal. GCS eye subscore is 4. GCS verbal subscore is 5. GCS motor subscore is 6.  Skin: Skin is warm, dry and intact. No rash noted. No cyanosis.  Psychiatric: He has a normal mood and affect. His speech is normal and behavior is normal. Thought content normal.    ED Course  Procedures (including critical care time)  Labs Reviewed  CBC WITH DIFFERENTIAL - Abnormal; Notable for the following:    HCT 37.5 (*)    MCHC 36.3 (*)    All other components within normal limits  COMPREHENSIVE METABOLIC PANEL - Abnormal; Notable for the following:    Glucose, Bld 119 (*)    BUN 45 (*)    Creatinine, Ser 1.53 (*)    GFR calc non Af Amer 48 (*)    GFR calc Af Amer 56 (*)    All other components within normal limits  LIPASE, BLOOD - Abnormal; Notable for the following:    Lipase 86 (*)    All other components within normal limits  URINALYSIS, ROUTINE W REFLEX MICROSCOPIC - Abnormal; Notable for the following:    Color, Urine AMBER (*)    APPearance CLOUDY (*)    Specific Gravity, Urine 1.033 (*)    Bilirubin Urine SMALL (*)    Ketones, ur 15 (*)    Protein, ur 30 (*)    All other components within normal limits  URINE MICROSCOPIC-ADD ON - Abnormal; Notable for the following:    Casts HYALINE CASTS (*)    All other components within normal limits  GLUCOSE, CAPILLARY   Ct Abdomen Pelvis W Contrast  08/21/2012  *RADIOLOGY REPORT*  Clinical Data: Nausea.  Reduced oral intake.  Weight loss. Abdominal distention and abdominal pain.  CT ABDOMEN AND PELVIS WITH CONTRAST  Technique:  Multidetector CT imaging of the abdomen and pelvis was performed following the standard protocol during bolus administration of intravenous  contrast.  Contrast: OMNIPAQUE IOHEXOL 300 MG/ML  SOLN  Comparison: 10/15/2011  Findings: Cardiomegaly noted with prominence of epicardial and pericardial adipose tissue.  The liver, spleen, pancreas, and adrenal glands appear unremarkable.  The gallbladder and biliary system appear unremarkable.  3 mm left kidney lower pole nonobstructive calculus.  Hypodense lesion in the left kidney lower pole, 1.1 cm in long axis, previously measuring 0.9 cm on 11/21/2007 and 1.0 cm on 10/15/2011.  Right kidney unremarkable. No pathologic retroperitoneal or porta hepatis adenopathy is identified.  Appendix normal.  Terminal ileum unremarkable.  Equivocal wall thickening in the sigmoid colon noted proximally. Urinary bladder unremarkable. No pathologic pelvic adenopathy is identified.  IMPRESSION:  1.  3 mm nonobstructive left kidney lower pole calculus. 3.  Mild increase in size of a left kidney lower pole hypodense lesion, quite likely a cyst, but too small to technically characterize currently.  Follow-up characterization by either renal protocol MRI with and without contrast or a 6-12 month  follow-up CT would be suggested. 3.  Equivocal wall thickening in the sigmoid colon may be a subtle sign of mild colitis.  However, it could be simply due to peristalsis. 4.  Cardiomegaly.   Original Report Authenticated By: Gaylyn Rong, M.D.      Diagnoses: 1. Anxiety and panic attack 2. Tinnitus 3. Abdominal pain    MDM  Patient comes to the ER for evaluation of multiple problems. Patient is experiencing increased anxiety and panic attacks. He takes Xanax for this but is not well controlled. The symptoms of anxiety and panic attacks have increased since he started having increased symptoms of tinnitus. Patient also reports weight loss and decreased appetite over the same duration. There is no homicidality or suicidality.  Patient's lab work is entirely normal. Vital signs are all normal. CAT scan showed equivocal  wall thickening that is likely peristalsis, but cannot rule out colitis. Patient therefore started on Cipro and Flagyl as an outpatient by mouth. Follow up with primary Dr. in the office this week. Return if symptoms worsen.        Gilda Crease, MD 08/21/12 [phone]

## 2012-10-03 IMAGING — CT CT ABD-PELV W/ CM
2 of 5 series · 16 of 46 positions shown, 18 images · IV contrast (Omnipaque 300)
Comparison: CT of abdomen and pelvis 06/25/2010.

CLINICAL DATA: Dizziness and nausea.

CT ABDOMEN AND PELVIS WITH CONTRAST
TECHNIQUE: Multidetector CT imaging of the abdomen and pelvis was
performed following the standard protocol during bolus
administration of intravenous contrast.
Contrast: 100mL OMNIPAQUE IOHEXOL 300 MG/ML  SOLN

[Series 3: abd_pel_with 5.0 b40f · axial · 0.95mm/px · z∈[+427,+902]mm · 13 of 107 slices shown, 15 images]
[im 6/107  soft-tissue]
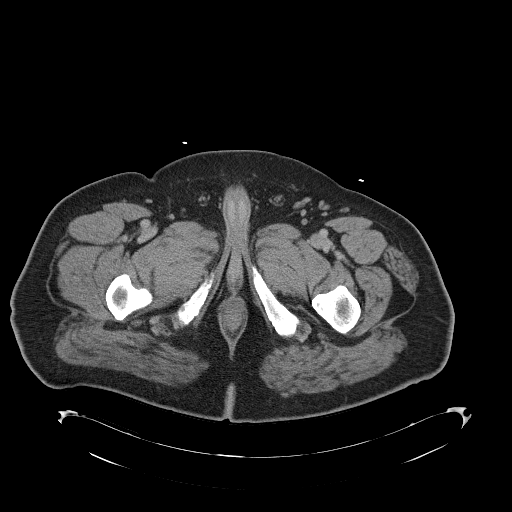
[im 6/107  bone]
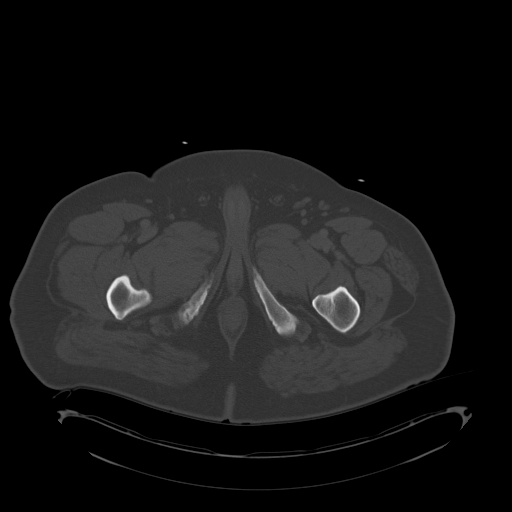
[im 17/107  soft-tissue]
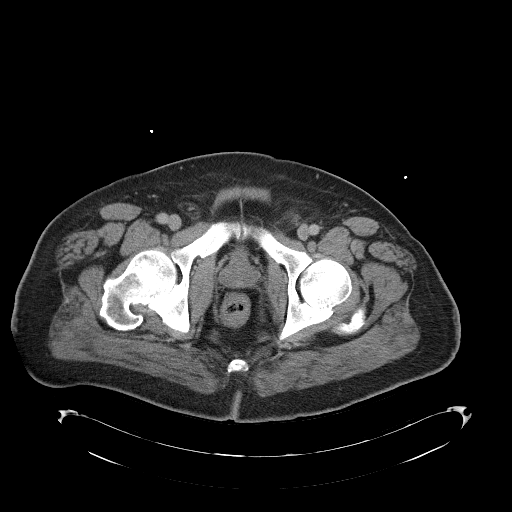
[im 23/107  soft-tissue]
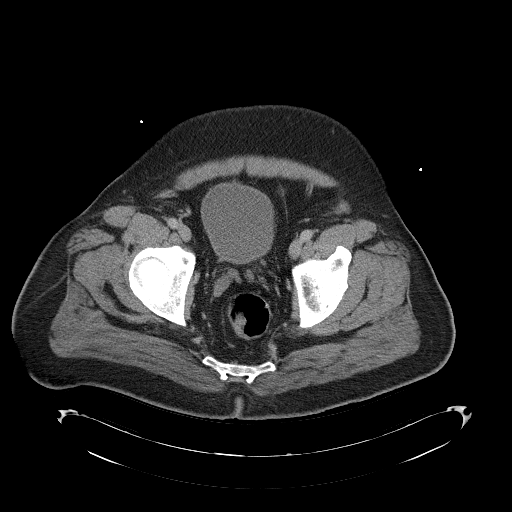
[im 28/107  soft-tissue]
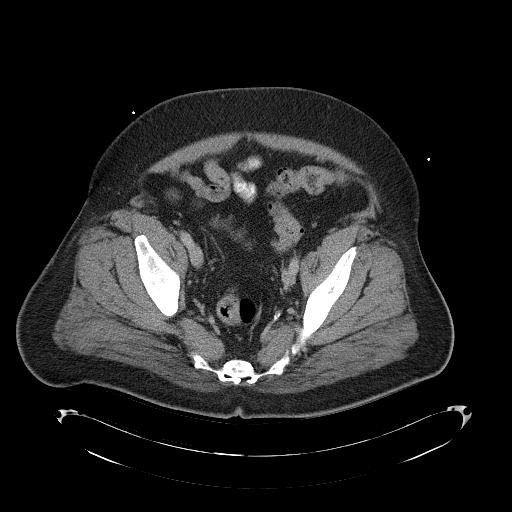
[im 40/107  soft-tissue]
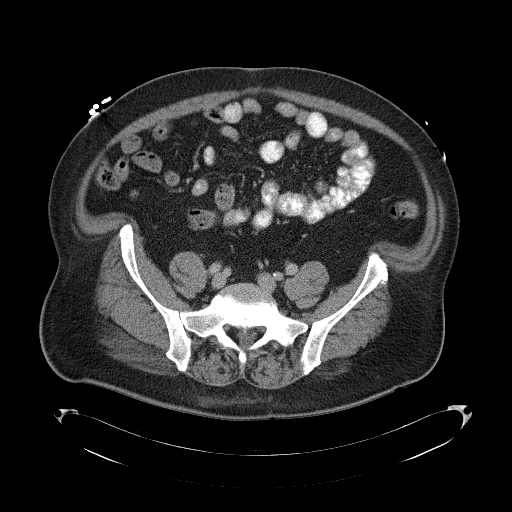
[im 45/107  soft-tissue]
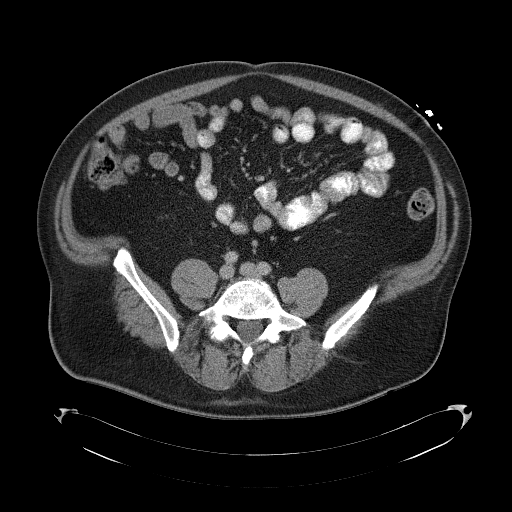
[im 56/107  soft-tissue]
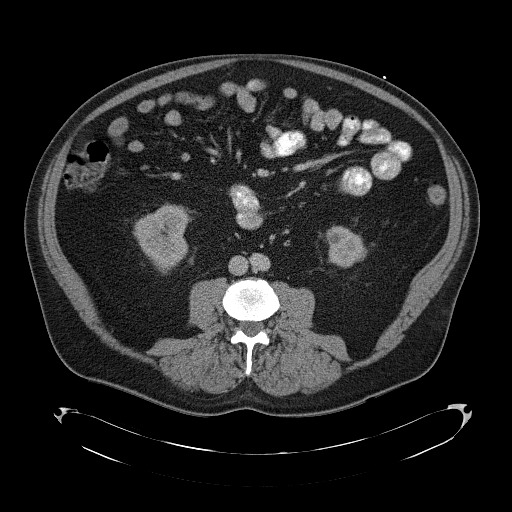
[im 62/107  soft-tissue]
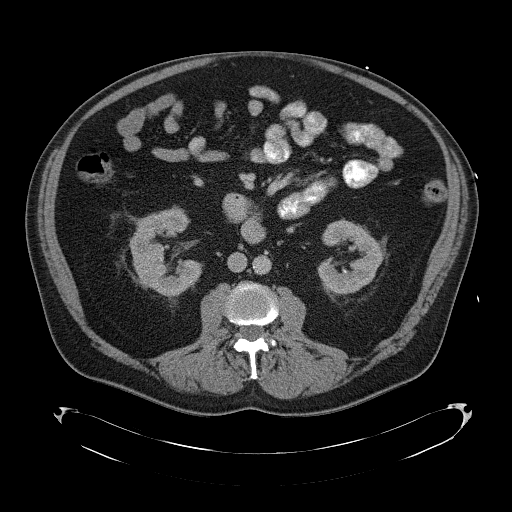
[im 67/107  soft-tissue]
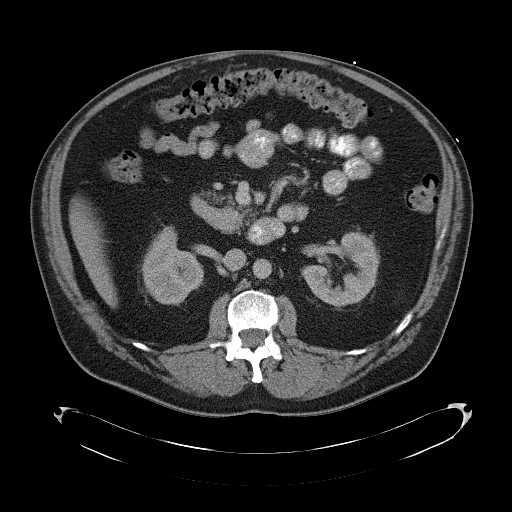
[im 67/107  bone]
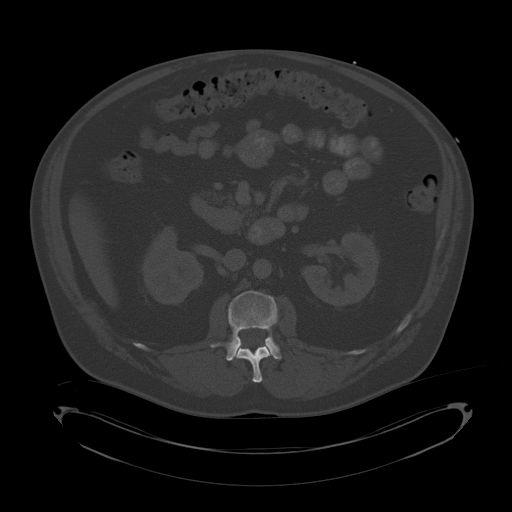
[im 79/107  soft-tissue]
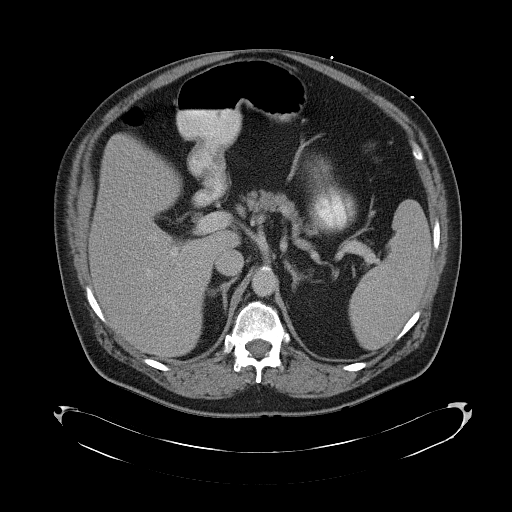
[im 84/107  soft-tissue]
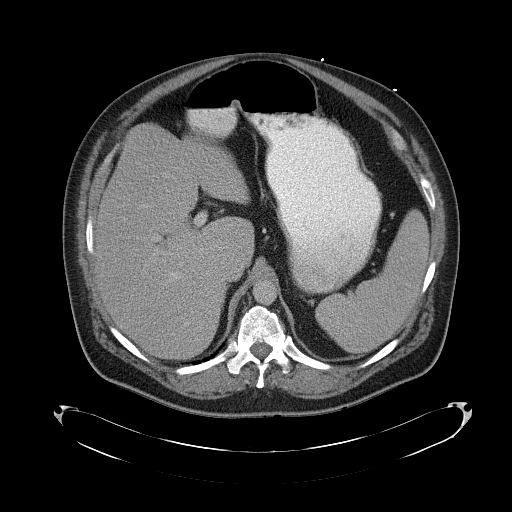
[im 90/107  soft-tissue]
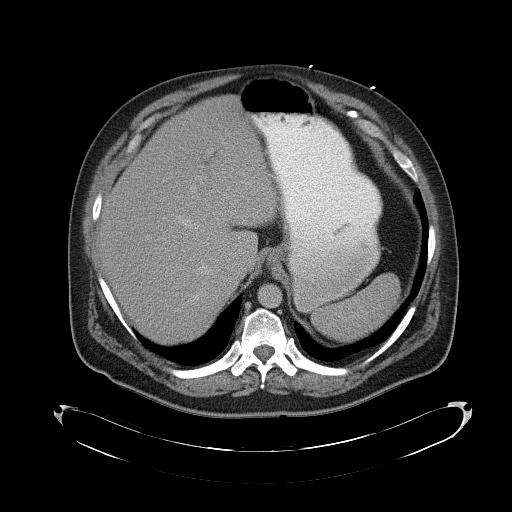
[im 101/107  soft-tissue]
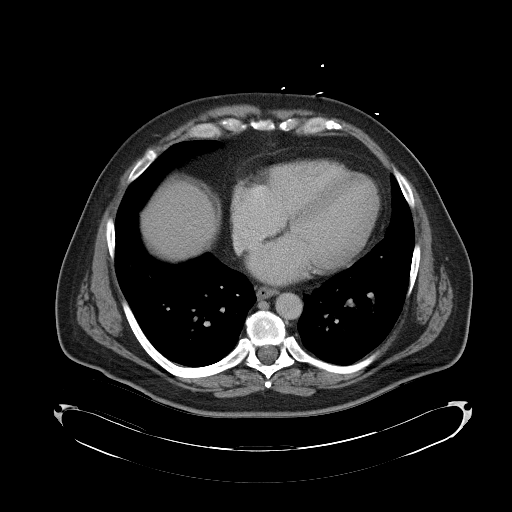

[Series 5: abd_pel_with 3.0 spo cor · coronal · 0.89mm/px · 3 of 113 slices shown]
[im 38/113  soft-tissue]
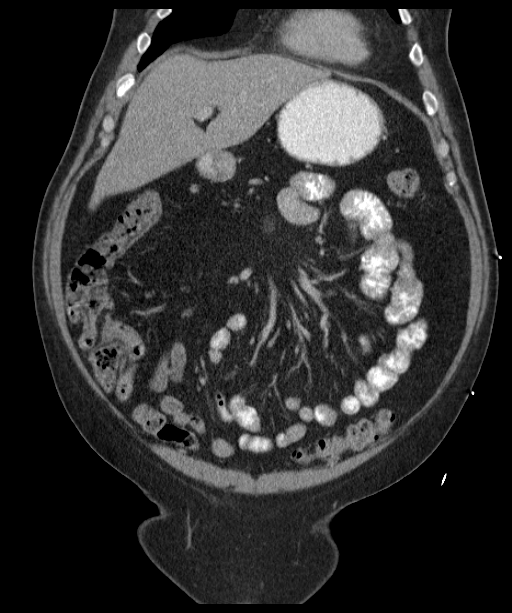
[im 50/113  soft-tissue]
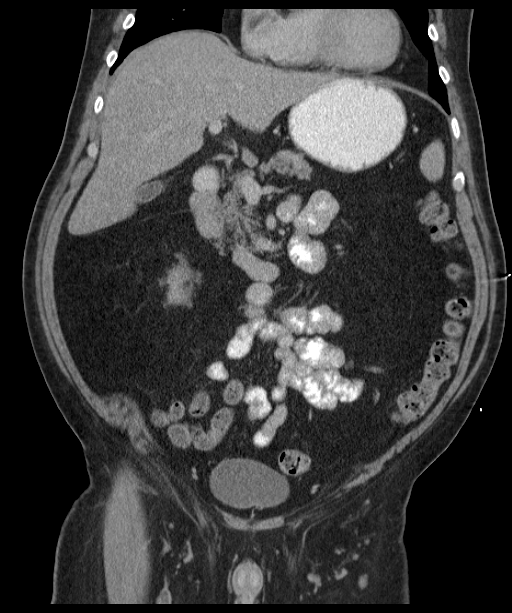
[im 63/113  soft-tissue]
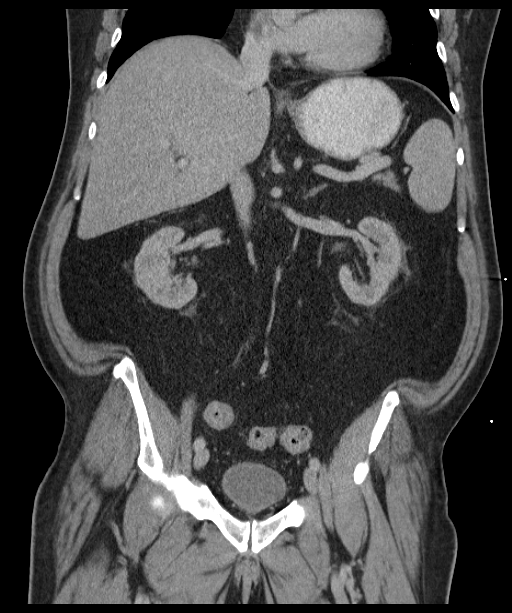

[16 of 46 positions shown; findings below may reference images not displayed]

FINDINGS: Lung Bases: Unremarkable.

Abdomen/Pelvis:  The enhanced appearance of the liver, gallbladder,
pancreas, spleen and bilateral adrenal glands is unremarkable.  In
the lower pole collecting system of the left kidney there is a 3 mm
calculus which is nonobstructive.  Mild bilateral perinephric
stranding is noted, which is nonspecific and can be seen in the
setting of chronic renal insufficiency.  There is a subcentimeter
low attenuation lesion in the lower pole of the left kidney which
is too small to definitively characterize, but is statistically
favored to represent a small cyst.

There is no ascites or pneumoperitoneum and no pathologic
distension of bowel.  No definite pathologic lymphadenopathy
identified within the abdomen or pelvis.  The appendix is normal.
Mild atherosclerosis throughout the abdominal and pelvic
vasculature is noted, without frank aneurysm or dissection.
Prostate and urinary bladder are unremarkable.

Musculoskeletal: There are no aggressive appearing lytic or blastic
lesions noted in the visualized portions of the skeleton.
IMPRESSION: 1.  No acute findings in the abdomen or pelvis to account for the
patient's symptoms.
2.  3 mm nonobstructive calculus in the lower pole collecting
system of the left kidney incidentally noted.
3.  Mild atherosclerosis.

## 2013-01-23 ENCOUNTER — Other Ambulatory Visit: Payer: Self-pay | Admitting: *Deleted

## 2013-01-23 MED ORDER — LOSARTAN POTASSIUM 100 MG PO TABS: 100.0000 mg | ORAL_TABLET | Freq: Every day | ORAL | Status: DC

## 2013-02-15 ENCOUNTER — Other Ambulatory Visit: Payer: Self-pay | Admitting: Dermatology

## 2013-04-24 ENCOUNTER — Other Ambulatory Visit: Payer: Self-pay

## 2013-04-24 MED ORDER — LOSARTAN POTASSIUM 100 MG PO TABS: 100.0000 mg | ORAL_TABLET | Freq: Every day | ORAL | Status: DC

## 2013-05-21 ENCOUNTER — Encounter: Payer: Self-pay | Admitting: Cardiology

## 2013-05-21 ENCOUNTER — Encounter: Payer: Self-pay | Admitting: *Deleted

## 2013-05-27 ENCOUNTER — Emergency Department (HOSPITAL_COMMUNITY)
Admission: EM | Admit: 2013-05-27 | Discharge: 2013-05-28 | Disposition: A | Discharge status: Home / Self Care | Payer: Medicare Other | Attending: Emergency Medicine | Admitting: Emergency Medicine

## 2013-05-27 ENCOUNTER — Encounter (HOSPITAL_COMMUNITY): Payer: Self-pay | Admitting: Emergency Medicine

## 2013-05-27 DIAGNOSIS — E669 Obesity, unspecified: Secondary | ICD-10-CM | POA: Insufficient documentation

## 2013-05-27 DIAGNOSIS — Z79899 Other long term (current) drug therapy: Secondary | ICD-10-CM | POA: Insufficient documentation

## 2013-05-27 DIAGNOSIS — Z7982 Long term (current) use of aspirin: Secondary | ICD-10-CM | POA: Insufficient documentation

## 2013-05-27 DIAGNOSIS — R110 Nausea: Secondary | ICD-10-CM | POA: Insufficient documentation

## 2013-05-27 DIAGNOSIS — N400 Enlarged prostate without lower urinary tract symptoms: Secondary | ICD-10-CM | POA: Insufficient documentation

## 2013-05-27 DIAGNOSIS — B0229 Other postherpetic nervous system involvement: Secondary | ICD-10-CM

## 2013-05-27 DIAGNOSIS — B029 Zoster without complications: Secondary | ICD-10-CM

## 2013-05-27 DIAGNOSIS — K2970 Gastritis, unspecified, without bleeding: Secondary | ICD-10-CM | POA: Insufficient documentation

## 2013-05-27 DIAGNOSIS — E119 Type 2 diabetes mellitus without complications: Secondary | ICD-10-CM | POA: Insufficient documentation

## 2013-05-27 DIAGNOSIS — B028 Zoster with other complications: Secondary | ICD-10-CM | POA: Insufficient documentation

## 2013-05-27 DIAGNOSIS — I10 Essential (primary) hypertension: Secondary | ICD-10-CM | POA: Insufficient documentation

## 2013-05-27 DIAGNOSIS — Z8673 Personal history of transient ischemic attack (TIA), and cerebral infarction without residual deficits: Secondary | ICD-10-CM | POA: Insufficient documentation

## 2013-05-27 DIAGNOSIS — E785 Hyperlipidemia, unspecified: Secondary | ICD-10-CM | POA: Insufficient documentation

## 2013-05-27 DIAGNOSIS — K2990 Gastroduodenitis, unspecified, without bleeding: Secondary | ICD-10-CM

## 2013-05-27 DIAGNOSIS — K219 Gastro-esophageal reflux disease without esophagitis: Secondary | ICD-10-CM | POA: Insufficient documentation

## 2013-05-27 NOTE — ED Notes (Signed)
Patient complaining of blistering rash on lower back x 2-3 days.

## 2013-05-28 MED ORDER — VALACYCLOVIR HCL 500 MG PO TABS
1000.0000 mg | ORAL_TABLET | Freq: Once | ORAL | Status: AC
  Administered 2013-05-28: 1000 mg via ORAL
  Filled 2013-05-28: qty 2

## 2013-05-28 MED ORDER — TRAMADOL HCL 50 MG PO TABS: 50.0000 mg | ORAL_TABLET | Freq: Four times a day (QID) | ORAL | Status: DC | PRN

## 2013-05-28 MED ORDER — VALACYCLOVIR HCL 1 G PO TABS: 1000.0000 mg | ORAL_TABLET | Freq: Three times a day (TID) | ORAL | Status: AC

## 2013-05-28 NOTE — ED Provider Notes (Signed)
Medical screening examination/treatment/procedure(s) were performed by non-physician practitioner and as supervising physician I was immediately available for consultation/collaboration.   Jerami Tammen, MD 05/28/13 0719 

## 2013-05-28 NOTE — ED Provider Notes (Signed)
CSN: [id number]     Arrival date & time 05/27/13  2240 History   First MD Initiated Contact with Patient 05/27/13 2357     Chief Complaint  Patient presents with  . Rash   (Consider location/radiation/quality/duration/timing/severity/associated sxs/prior Treatment) Patient is a 58 y.o. male presenting with rash. The history is provided by the patient. No language interpreter was used.  Rash Location:  Torso Torso rash location:  Lower back Quality: blistering, burning and redness   Severity:  Severe Onset quality:  Gradual Duration:  3 days Timing:  Constant Progression:  Worsening Chronicity:  New Associated symptoms: nausea     Past Medical History  Diagnosis Date  . Hypertension   . Hyperlipidemia   . Obesity   . Acid reflux disease   . Gastritis   . BPH (benign prostatic hypertrophy)   . Vertigo   . Normal nuclear stress test Dec 2011  . Type II diabetes mellitus   . H/O hiatal hernia   . TIA (transient ischemic attack) 05/15/2012    "they think I've had one this am @ 0130" (05/15/2012)   Past Surgical History  Procedure Laterality Date  . Cardiovascular stress test  2009    EF 63%  . No past surgeries     Family History  Problem Relation Age of Onset  . Hyperlipidemia Father   . Colon cancer Father   . Stroke Mother    History  Substance Use Topics  . Smoking status: Never Smoker   . Smokeless tobacco: Never Used  . Alcohol Use: No    Review of Systems  Gastrointestinal: Positive for nausea.  Skin: Positive for rash.  All other systems reviewed and are negative.    Allergies  Review of patient's allergies indicates no known allergies.  Home Medications   Current Outpatient Rx  Name  Route  Sig  Dispense  Refill  . ALPRAZolam (XANAX) 0.5 MG tablet   Oral   Take 0.25 mg by mouth 3 (three) times daily.         [name] aspirin 325 MG tablet   Oral   Take 1 tablet (325 mg total) by mouth daily.   30 tablet   12   . atenolol (TENORMIN) 100 MG  tablet   Oral   Take 100 mg by mouth 2 (two) times daily.           . carbamazepine (EPITOL) 200 MG tablet   Oral   Take 200 mg by mouth 2 (two) times daily.         . Cholecalciferol (VITAMIN D3) 5000 UNITS TABS   Oral   Take 5,000 Units by mouth daily.         [name] ezetimibe (ZETIA) 10 MG tablet   Oral   Take 10 mg by mouth daily.           [name] FLUoxetine (PROZAC) 20 MG capsule   Oral   Take 20 mg by mouth daily.         [name] glipiZIDE (GLUCOTROL) 10 MG tablet   Oral   Take 10 mg by mouth 2 (two) times daily before a meal.           . hydrOXYzine (ATARAX/VISTARIL) 25 MG tablet   Oral   Take 1 tablet (25 mg total) by mouth every 6 (six) hours as needed for anxiety.   12 tablet   0   . lamoTRIgine (LAMICTAL) 200 MG tablet   Oral   Take 200 mg  by mouth 2 (two) times daily.         [name] losartan (COZAAR) 100 MG tablet   Oral   Take 1 tablet (100 mg total) by mouth daily.   30 tablet   0     Needs to keep  appointment with cardiologist befor ...   . meclizine (ANTIVERT) 25 MG tablet   Oral   Take 25 mg by mouth 2 (two) times daily.         . metFORMIN (GLUCOPHAGE) 1000 MG tablet   Oral   Take 1,000 mg by mouth 2 (two) times daily with a meal.           . metoCLOPramide (REGLAN) 5 MG tablet   Oral   Take 5 mg by mouth 4 (four) times daily.         [name] oxybutynin (DITROPAN-XL) 5 MG 24 hr tablet   Oral   Take 5 mg by mouth daily.         . pantoprazole (PROTONIX) 40 MG tablet   Oral   Take 40 mg by mouth 2 (two) times daily.          . rosuvastatin (CRESTOR) 40 MG tablet   Oral   Take 40 mg by mouth daily.         . Tamsulosin HCl (FLOMAX) 0.4 MG CAPS   Oral   Take 0.4 mg by mouth 2 (two) times daily.         [name] zolpidem (AMBIEN) 10 MG tablet   Oral   Take 1 tablet (10 mg total) by mouth at bedtime as needed for sleep.   30 tablet   0    BP 148/86  Pulse 88  Temp(Src) 98.4 F (36.9 C) (Oral)  Resp 20  Ht 5\' 9"  (1.753 m)  Wt  230 lb (104.327 kg)  BMI 33.95 kg/m2  SpO2 98% Physical Exam  Nursing note and vitals reviewed. Constitutional: He is oriented to person, place, and time. He appears well-developed and well-nourished.  HENT:  Head: Normocephalic and atraumatic.  Eyes: Pupils are equal, round, and reactive to light.  Neck: Normal range of motion.  Cardiovascular: Normal rate and regular rhythm.   Pulmonary/Chest: Effort normal and breath sounds normal.  Abdominal: Soft.  Musculoskeletal: Normal range of motion. He exhibits no edema.  Lymphadenopathy:    He has no cervical adenopathy.  Neurological: He is alert and oriented to person, place, and time.  Skin: Skin is warm and dry. Rash noted. Rash is vesicular.     Rash does not cross midline.   Psychiatric: He has a normal mood and affect.    ED Course  Procedures (including critical care time) Labs Review Labs Reviewed - No data to display Imaging Review No results found.  EKG Interpretation   None      Red vesicular rash along sacral dermatome consistent with herpes zoster.  Valtrex, tramadol, PCP follow-up. MDM  Herpes zoster.    Norman Herrlich, NP 05/28/13 [phone]

## 2013-05-28 NOTE — Discharge Instructions (Signed)
Shingles Shingles (herpes zoster) is an infection that is caused by the same virus that causes chickenpox (varicella). The infection causes a painful skin rash and fluid-filled blisters, which eventually break open, crust over, and heal. It may occur in any area of the body, but it usually affects only one side of the body or face. The pain of shingles usually lasts about 1 month. However, some people with shingles may develop long-term (chronic) pain in the affected area of the body. Shingles often occurs many years after the person had chickenpox. It is more common:  In people older than 50 years.  In people with weakened immune systems, such as those with HIV, AIDS, or cancer.  In people taking medicines that weaken the immune system, such as transplant medicines.  In people under great stress. CAUSES  Shingles is caused by the varicella zoster virus (VZV), which also causes chickenpox. After a person is infected with the virus, it can remain in the person's body for years in an inactive state (dormant). To cause shingles, the virus reactivates and breaks out as an infection in a nerve root. The virus can be spread from person to person (contagious) through contact with open blisters of the shingles rash. It will only spread to people who have not had chickenpox. When these people are exposed to the virus, they may develop chickenpox. They will not develop shingles. Once the blisters scab over, the person is no longer contagious and cannot spread the virus to others. SYMPTOMS  Shingles shows up in stages. The initial symptoms may be pain, itching, and tingling in an area of the skin. This pain is usually described as burning, stabbing, or throbbing.In a few days or weeks, a painful red rash will appear in the area where the pain, itching, and tingling were felt. The rash is usually on one side of the body in a band or belt-like pattern. Then, the rash usually turns into fluid-filled blisters. They  will scab over and dry up in approximately 2 3 weeks. Flu-like symptoms may also occur with the initial symptoms, the rash, or the blisters. These may include:  Fever.  Chills.  Headache.  Upset stomach. DIAGNOSIS  Your caregiver will perform a skin exam to diagnose shingles. Skin scrapings or fluid samples may also be taken from the blisters. This sample will be examined under a microscope or sent to a lab for further testing. TREATMENT  There is no specific cure for shingles. Your caregiver will likely prescribe medicines to help you manage the pain, recover faster, and avoid long-term problems. This may include antiviral drugs, anti-inflammatory drugs, and pain medicines. HOME CARE INSTRUCTIONS   Take a cool bath or apply cool compresses to the area of the rash or blisters as directed. This may help with the pain and itching.   Only take over-the-counter or prescription medicines as directed by your caregiver.   Rest as directed by your caregiver.  Keep your rash and blisters clean with mild soap and cool water or as directed by your caregiver.  Do not pick your blisters or scratch your rash. Apply an anti-itch cream or numbing creams to the affected area as directed by your caregiver.  Keep your shingles rash covered with a loose bandage (dressing).  Avoid skin contact with:  Babies.   Pregnant women.   Children with eczema.   Elderly people with transplants.   People with chronic illnesses, such as leukemia or AIDS.   Wear loose-fitting clothing to help ease   the pain of material rubbing against the rash.  Keep all follow-up appointments with your caregiver.If the area involved is on your face, you may receive a referral for follow-up to a specialist, such as an eye doctor (ophthalmologist) or an ear, nose, and throat (ENT) doctor. Keeping all follow-up appointments will help you avoid eye complications, chronic pain, or disability.  SEEK IMMEDIATE MEDICAL  CARE IF:   You have facial pain, pain around the eye area, or loss of feeling on one side of your face.  You have ear pain or ringing in your ear.  You have loss of taste.  Your pain is not relieved with prescribed medicines.   Your redness or swelling spreads.   You have more pain and swelling.  Your condition is worsening or has changed.   You have a feveror persistent symptoms for more than 2 3 days.  You have a fever and your symptoms suddenly get worse. MAKE SURE YOU:  Understand these instructions.  Will watch your condition.  Will get help right away if you are not doing well or get worse. Document Released: 04/19/2005 Document Revised: 01/12/2012 Document Reviewed: 12/02/2011 [employer] Patient Information 2014 [employer].  

## 2013-05-30 ENCOUNTER — Encounter: Payer: Self-pay | Admitting: Cardiology

## 2013-05-30 ENCOUNTER — Ambulatory Visit: Payer: Medicare Other | Admitting: Cardiovascular Disease

## 2013-05-30 ENCOUNTER — Encounter: Payer: Self-pay | Admitting: *Deleted

## 2013-05-30 ENCOUNTER — Encounter: Payer: Self-pay | Admitting: Internal Medicine

## 2013-06-11 ENCOUNTER — Encounter: Payer: Self-pay | Admitting: Cardiovascular Disease

## 2013-06-11 ENCOUNTER — Ambulatory Visit (INDEPENDENT_AMBULATORY_CARE_PROVIDER_SITE_OTHER): Payer: Medicare Other | Admitting: Cardiovascular Disease

## 2013-06-11 VITALS — BP 132/90 | HR 95 | Ht 69.5 in | Wt 236.0 lb

## 2013-06-11 DIAGNOSIS — Z9189 Other specified personal risk factors, not elsewhere classified: Secondary | ICD-10-CM

## 2013-06-11 DIAGNOSIS — I10 Essential (primary) hypertension: Secondary | ICD-10-CM

## 2013-06-11 DIAGNOSIS — E7800 Pure hypercholesterolemia, unspecified: Secondary | ICD-10-CM

## 2013-06-11 MED ORDER — ASPIRIN 81 MG PO TABS: 81.0000 mg | ORAL_TABLET | Freq: Every day | ORAL | Status: DC

## 2013-06-11 MED ORDER — LOSARTAN POTASSIUM 100 MG PO TABS: 100.0000 mg | ORAL_TABLET | Freq: Every day | ORAL | Status: DC

## 2013-06-11 NOTE — Progress Notes (Signed)
Robert Walters Date of Birth  October 29, 1955       [facility] [address], [address], [place], [place]  [address]   [place], [place]  [address] [phone]     [phone]   Fax  [phone]    Fax [phone]  Problem List: 1. Hypertension 2. Hyperlipidemia 3. Diabetes mellitus - with gastroparesis 5. Obesity 6. GERD  History of Present Illness:  Robert Walters is a 58 yo with the above noted medical history.  He is on disability now for stomach issues.  He's done well from a cardiac standpoint. He's not had any episodes of chest pain or shortness breath. He's been walking and has lost 13 pounds recently.  Feb. 9, 2015:  Robert Walters is doing ok.  He has been depressed - his mother died recently.    Current Outpatient Prescriptions on File Prior to Visit  Medication Sig Dispense Refill  . ALPRAZolam (XANAX) 0.5 MG tablet Take 0.25 mg by mouth 3 (three) times daily.      [name] aspirin 325 MG tablet Take 1 tablet (325 mg total) by mouth daily.  30 tablet  12  . atenolol (TENORMIN) 100 MG tablet Take 100 mg by mouth 2 (two) times daily.        . carbamazepine (EPITOL) 200 MG tablet Take 200 mg by mouth 2 (two) times daily.      . Cholecalciferol (VITAMIN D3) 5000 UNITS TABS Take 5,000 Units by mouth daily.      [name] FLUoxetine (PROZAC) 20 MG capsule Take 20 mg by mouth daily.      [name] glipiZIDE (GLUCOTROL) 10 MG tablet Take 10 mg by mouth 2 (two) times daily before a meal.        . hydrOXYzine (ATARAX/VISTARIL) 25 MG tablet Take 1 tablet (25 mg total) by mouth every 6 (six) hours as needed for anxiety.  12 tablet  0  . lamoTRIgine (LAMICTAL) 200 MG tablet Take 200 mg by mouth 2 (two) times daily.      [name] losartan (COZAAR) 100 MG tablet Take 1 tablet (100 mg total) by mouth daily.  30 tablet  0  . meclizine (ANTIVERT) 25 MG tablet Take 25 mg by mouth 2 (two) times daily.      . metFORMIN (GLUCOPHAGE) 1000 MG tablet Take 1,000 mg by mouth 2  (two) times daily with a meal.        . metoCLOPramide (REGLAN) 5 MG tablet Take 5 mg by mouth 4 (four) times daily.      [name] oxybutynin (DITROPAN-XL) 5 MG 24 hr tablet Take 5 mg by mouth daily.      . pantoprazole (PROTONIX) 40 MG tablet Take 40 mg by mouth 2 (two) times daily.       . rosuvastatin (CRESTOR) 40 MG tablet Take 40 mg by mouth daily.      . Tamsulosin HCl (FLOMAX) 0.4 MG CAPS Take 0.4 mg by mouth 2 (two) times daily.      . traMADol (ULTRAM) 50 MG tablet Take 1 tablet (50 mg total) by mouth every 6 (six) hours as needed.  15 tablet  0  . valACYclovir (VALTREX) 1000 MG tablet Take 1 tablet (1,000 mg total) by mouth 3 (three) times daily.  20 tablet  0  . zolpidem (AMBIEN) 10 MG tablet Take 1 tablet (10 mg total) by mouth at bedtime as needed for sleep.  30 tablet  0  No current facility-administered medications on file prior to visit.    No Known Allergies  Past Medical History  Diagnosis Date  . Hypertension   . Hyperlipidemia   . Obesity   . Acid reflux disease   . Gastritis   . BPH (benign prostatic hypertrophy)   . Vertigo   . Normal nuclear stress test Dec 2011  . Type II diabetes mellitus   . H/O hiatal hernia   . TIA (transient ischemic attack) 05/15/2012    "Robert Walters think I've had one this am @ 0130" (05/15/2012)    Past Surgical History  Procedure Laterality Date  . Cardiovascular stress test  2009    EF 63%  . No past surgeries      History  Smoking status  . Never Smoker   Smokeless tobacco  . Never Used    History  Alcohol Use No    Family History  Problem Relation Age of Onset  . Hyperlipidemia Father   . Colon cancer Father   . Stroke Mother     Reviw of Systems:  Reviewed in the HPI.  All other systems are negative.  Physical Exam: Blood pressure 132/90, pulse 95, height 5' 9.5" (1.765 m), weight 236 lb (107.049 kg). General: Well developed, well nourished, in no acute distress.  Head: Normocephalic, atraumatic, sclera non-icteric,  mucus membranes are moist,   Neck: Supple. Carotids are 2 + without bruits. No JVD  Lungs: Clear bilaterally to auscultation.  Heart: regular rate.  normal  S1 S2. No murmurs, gallops or rubs.  Abdomen: Soft, non-tender, non-distended with normal bowel sounds. No hepatomegaly. No rebound/guarding. No masses.  Msk:  Strength and tone are normal  Extremities: No clubbing or cyanosis. No edema.  Distal pedal pulses are 2+ and equal bilaterally.  Neuro: Alert and oriented X 3. Moves all extremities spontaneously.  Psych:  Responds to questions appropriately with a normal affect.  ECG: 06/11/2013:  NSR at75.  Small Q waves in Inf. Leads.   The ECG is unchanged from previous tracings     Assessment / Plan:

## 2013-06-11 NOTE — Assessment & Plan Note (Signed)
We'll check fasting labs on him at his next visit. He's not fasting today and in fact had a biscuit for breakfast.  I've encouraged him to continue with his weight loss efforts.

## 2013-06-11 NOTE — Assessment & Plan Note (Signed)
Continue current meds.  Encouraged him to lose weight.

## 2013-06-11 NOTE — Patient Instructions (Signed)
Your physician wants you to follow-up in:1 YEAR  You will receive a reminder letter in the mail two months in advance. If you don't receive a letter, please call our office to schedule the follow-up appointment.   Your physician recommends that you continue on your current medications as directed. Please refer to the Current Medication list given to you today.   Your physician has recommended you make the following change in your medication: Warren

## 2014-04-02 ENCOUNTER — Other Ambulatory Visit: Payer: Self-pay | Admitting: Cardiovascular Disease

## 2014-06-11 ENCOUNTER — Ambulatory Visit (INDEPENDENT_AMBULATORY_CARE_PROVIDER_SITE_OTHER): Payer: Medicare Other | Admitting: Cardiovascular Disease

## 2014-06-11 ENCOUNTER — Encounter: Payer: Self-pay | Admitting: Cardiovascular Disease

## 2014-06-11 VITALS — BP 90/76 | HR 77 | Ht 69.5 in | Wt 236.2 lb

## 2014-06-11 DIAGNOSIS — E7800 Pure hypercholesterolemia, unspecified: Secondary | ICD-10-CM

## 2014-06-11 DIAGNOSIS — I10 Essential (primary) hypertension: Secondary | ICD-10-CM

## 2014-06-11 MED ORDER — LOSARTAN POTASSIUM 100 MG PO TABS: 100.0000 mg | ORAL_TABLET | Freq: Every day | ORAL | Status: DC

## 2014-06-11 NOTE — Progress Notes (Signed)
Cardiology Office Note   Date:  06/11/2014   ID:  NANDAN WILLEMS, DOB 01-03-1956, MRN [id number]  PCP:  No primary care provider on file.  Cardiologist:   Nahser, Wonda Cheng, MD   No chief complaint on file.  Problem List: 1. Hypertension 2. Hyperlipidemia 3. Diabetes mellitus - with gastroparesis 5. Obesity 6. GERD  Feb. 9, 2016:  Robert Walters is a 59 y.o. male who presents for follow up of his HTN . He also has a history of hyperlipidemia that is controlled and monitored by his medical doctor.  He does not eat restricted diet. He does not get any regular exercise. He's had some issues with depression and spends a lot of time in bed.   Past Medical History  Diagnosis Date  . Hypertension   . Hyperlipidemia   . Obesity   . Acid reflux disease   . Gastritis   . BPH (benign prostatic hypertrophy)   . Vertigo   . Normal nuclear stress test Dec 2011  . Type II diabetes mellitus   . H/O hiatal hernia   . TIA (transient ischemic attack) 05/15/2012    "they think I've had one this am @ 0130" (05/15/2012)    Past Surgical History  Procedure Laterality Date  . Cardiovascular stress test  2009    EF 63%  . No past surgeries       Current Outpatient Prescriptions  Medication Sig Dispense Refill  . ALPRAZolam (XANAX) 0.5 MG tablet Take 0.25 mg by mouth 3 (three) times daily.    [name] aspirin 81 MG tablet Take 1 tablet (81 mg total) by mouth daily. 30 tablet 12  . atenolol (TENORMIN) 100 MG tablet Take 100 mg by mouth 2 (two) times daily.      . carbamazepine (EPITOL) 200 MG tablet Take 200 mg by mouth 2 (two) times daily.    . Cholecalciferol (VITAMIN D3) 5000 UNITS TABS Take 5,000 Units by mouth daily.    [name] FLUoxetine (PROZAC) 20 MG capsule Take 20 mg by mouth daily.    [name] glipiZIDE (GLUCOTROL) 10 MG tablet Take 10 mg by mouth 2 (two) times daily before a meal.      . hydrOXYzine (ATARAX/VISTARIL) 25 MG tablet Take 1 tablet (25 mg total) by mouth every 6 (six) hours as  needed for anxiety. 12 tablet 0  . lamoTRIgine (LAMICTAL) 200 MG tablet Take 200 mg by mouth 2 (two) times daily.    [name] losartan (COZAAR) 100 MG tablet TAKE 1 BY MOUTH DAILY 90 tablet 0  . meclizine (ANTIVERT) 25 MG tablet Take 25 mg by mouth 2 (two) times daily.    . metFORMIN (GLUCOPHAGE) 1000 MG tablet Take 1,000 mg by mouth 2 (two) times daily with a meal.      . metoCLOPramide (REGLAN) 5 MG tablet Take 5 mg by mouth 4 (four) times daily.    [name] oxybutynin (DITROPAN-XL) 5 MG 24 hr tablet Take 5 mg by mouth daily.    . pantoprazole (PROTONIX) 40 MG tablet Take 40 mg by mouth 2 (two) times daily.     . rosuvastatin (CRESTOR) 40 MG tablet Take 40 mg by mouth daily.    . Tamsulosin HCl (FLOMAX) 0.4 MG CAPS Take 0.4 mg by mouth 2 (two) times daily.    . traMADol (ULTRAM) 50 MG tablet Take 1 tablet (50 mg total) by mouth every 6 (six) hours as needed. 15 tablet 0  . zolpidem (AMBIEN) 10 MG tablet Take 1  tablet (10 mg total) by mouth at bedtime as needed for sleep. 30 tablet 0   No current facility-administered medications for this visit.    Allergies:   Review of patient's allergies indicates no known allergies.    Social History:  The patient  reports that he has never smoked. He has never used smokeless tobacco. He reports that he does not drink alcohol or use illicit drugs.   Family History:  The patient's family history includes Colon cancer in his father; Hyperlipidemia in his father; Stroke in his mother.    ROS:  Please see the history of present illness.    Review of Systems: Constitutional:  denies fever, chills, diaphoresis, appetite change and fatigue.  HEENT: denies photophobia, eye pain, redness, hearing loss, ear pain, congestion, sore throat, rhinorrhea, sneezing, neck pain, neck stiffness and tinnitus.  Respiratory: denies SOB, DOE, cough, chest tightness, and wheezing.  Cardiovascular: denies chest pain, palpitations and leg swelling.  Gastrointestinal: denies nausea,  vomiting, abdominal pain, diarrhea, constipation, blood in stool.  Genitourinary: denies dysuria, urgency, frequency, hematuria, flank pain and difficulty urinating.  Musculoskeletal: denies  myalgias, back pain, joint swelling, arthralgias and gait problem.   Skin: denies pallor, rash and wound.  Neurological: denies dizziness, seizures, syncope, weakness, light-headedness, numbness and headaches.   Hematological: denies adenopathy, easy bruising, personal or family bleeding history.  Psychiatric/ Behavioral: denies suicidal ideation, mood changes, confusion, nervousness, sleep disturbance and agitation.       All other systems are reviewed and negative.    PHYSICAL EXAM: VS:  There were no vitals taken for this visit. , BMI There is no weight on file to calculate BMI. GEN: Well nourished, well developed, in no acute distress HEENT: normal Neck: no JVD, carotid bruits, or masses Cardiac: RRR; no murmurs, rubs, or gallops,no edema  Respiratory:  clear to auscultation bilaterally, normal work of breathing GI: soft, nontender, nondistended, + BS MS: no deformity or atrophy Skin: warm and dry, no rash Neuro:  Strength and sensation are intact Psych: normal   EKG:  EKG is ordered today. The ekg ordered today demonstrates NSR at [employer]. RBBB with associated ST changes.     Recent Labs: No results found for requested labs within last 365 days.    Lipid Panel    Component Value Date/Time   CHOL 164 05/16/2012 0625   TRIG 212* 05/16/2012 0625   HDL 48 05/16/2012 0625   CHOLHDL 3.4 05/16/2012 0625   VLDL 42* 05/16/2012 0625   LDLCALC 74 05/16/2012 0625   LDLDIRECT 124.3 11/10/2010 0953      Wt Readings from Last 3 Encounters:  06/11/13 236 lb (107.049 kg)  05/27/13 230 lb (104.327 kg)  08/06/12 235 lb (106.595 kg)      Other studies Reviewed: Additional studies/ records that were reviewed today include:. Review of the above records demonstrates:    ASSESSMENT AND  PLAN:  Problem List: 1. Hypertension - BP is well controlled.   Continue current meds.   2. Hyperlipidemia - followed by his general medical doctor  3. Diabetes mellitus - with gastroparesis  5. Obesity - encouraged him to exercise   6. GERD   Current medicines are reviewed at length with the patient today.  The patient does not have concerns regarding medicines.  The following changes have been made:  no change   Disposition:   FU with me in 1 year     Signed, Nahser, Wonda Cheng, MD  06/11/2014 8:03 AM  [facility] [address], [place], [place]  [address] Phone: [phone]; Fax: [phone]

## 2014-06-11 NOTE — Patient Instructions (Signed)
Your physician recommends that you continue on your current medications as directed. Please refer to the Current Medication list given to you today.  Your physician wants you to follow-up in: 1 year with Dr. Nahser.  You will receive a reminder letter in the mail two months in advance. If you don't receive a letter, please call our office to schedule the follow-up appointment.  

## 2014-08-09 ENCOUNTER — Encounter (HOSPITAL_COMMUNITY): Payer: Self-pay

## 2014-08-09 ENCOUNTER — Inpatient Hospital Stay (HOSPITAL_COMMUNITY)
Admission: EM | Admit: 2014-08-09 | Discharge: 2014-08-11 | DRG: 069 | Disposition: A | Discharge status: Home / Self Care | Payer: Medicare Other | Attending: Internal Medicine | Admitting: Internal Medicine

## 2014-08-09 ENCOUNTER — Emergency Department (HOSPITAL_COMMUNITY): Payer: Medicare Other

## 2014-08-09 DIAGNOSIS — R42 Dizziness and giddiness: Secondary | ICD-10-CM | POA: Diagnosis not present

## 2014-08-09 DIAGNOSIS — G459 Transient cerebral ischemic attack, unspecified: Secondary | ICD-10-CM | POA: Diagnosis not present

## 2014-08-09 DIAGNOSIS — K219 Gastro-esophageal reflux disease without esophagitis: Secondary | ICD-10-CM | POA: Diagnosis present

## 2014-08-09 DIAGNOSIS — Z8673 Personal history of transient ischemic attack (TIA), and cerebral infarction without residual deficits: Secondary | ICD-10-CM

## 2014-08-09 DIAGNOSIS — Z7982 Long term (current) use of aspirin: Secondary | ICD-10-CM

## 2014-08-09 DIAGNOSIS — E785 Hyperlipidemia, unspecified: Secondary | ICD-10-CM | POA: Diagnosis present

## 2014-08-09 DIAGNOSIS — N183 Chronic kidney disease, stage 3 unspecified: Secondary | ICD-10-CM | POA: Diagnosis present

## 2014-08-09 DIAGNOSIS — Z6834 Body mass index (BMI) 34.0-34.9, adult: Secondary | ICD-10-CM

## 2014-08-09 DIAGNOSIS — Z800 Family history of malignant neoplasm of digestive organs: Secondary | ICD-10-CM

## 2014-08-09 DIAGNOSIS — Z823 Family history of stroke: Secondary | ICD-10-CM

## 2014-08-09 DIAGNOSIS — I10 Essential (primary) hypertension: Secondary | ICD-10-CM | POA: Diagnosis present

## 2014-08-09 DIAGNOSIS — E669 Obesity, unspecified: Secondary | ICD-10-CM | POA: Diagnosis present

## 2014-08-09 DIAGNOSIS — E118 Type 2 diabetes mellitus with unspecified complications: Secondary | ICD-10-CM | POA: Diagnosis present

## 2014-08-09 LAB — CBC WITH DIFFERENTIAL/PLATELET
BASOS ABS: 0 10*3/uL (ref 0.0–0.1)
BASOS PCT: 0 % (ref 0–1)
EOS ABS: 0.3 10*3/uL (ref 0.0–0.7)
Eosinophils Relative: 5 % (ref 0–5)
HEMATOCRIT: 37.5 % — AB (ref 39.0–52.0)
Hemoglobin: 12.8 g/dL — ABNORMAL LOW (ref 13.0–17.0)
Lymphocytes Relative: 22 % (ref 12–46)
Lymphs Abs: 1.3 10*3/uL (ref 0.7–4.0)
MCH: 31.1 pg (ref 26.0–34.0)
MCHC: 34.1 g/dL (ref 30.0–36.0)
MCV: 91.2 fL (ref 78.0–100.0)
MONOS PCT: 6 % (ref 3–12)
Monocytes Absolute: 0.4 10*3/uL (ref 0.1–1.0)
NEUTROS ABS: 4 10*3/uL (ref 1.7–7.7)
Neutrophils Relative %: 66 % (ref 43–77)
PLATELETS: 106 10*3/uL — AB (ref 150–400)
RBC: 4.11 MIL/uL — ABNORMAL LOW (ref 4.22–5.81)
RDW: 13.8 % (ref 11.5–15.5)
WBC: 6.1 10*3/uL (ref 4.0–10.5)

## 2014-08-09 LAB — COMPREHENSIVE METABOLIC PANEL
ALK PHOS: 92 U/L (ref 39–117)
ALT: 32 U/L (ref 0–53)
AST: 31 U/L (ref 0–37)
Albumin: 3.9 g/dL (ref 3.5–5.2)
Anion gap: 10 (ref 5–15)
BILIRUBIN TOTAL: 0.6 mg/dL (ref 0.3–1.2)
BUN: 21 mg/dL (ref 6–23)
CALCIUM: 8.9 mg/dL (ref 8.4–10.5)
CO2: 22 mmol/L (ref 19–32)
Chloride: 106 mmol/L (ref 96–112)
Creatinine, Ser: 1.21 mg/dL (ref 0.50–1.35)
GFR calc non Af Amer: 63 mL/min — ABNORMAL LOW (ref >90)
GFR, EST AFRICAN AMERICAN: 73 mL/min — AB (ref >90)
GLUCOSE: 191 mg/dL — AB (ref 70–99)
POTASSIUM: 5.1 mmol/L (ref 3.5–5.1)
Sodium: 138 mmol/L (ref 135–145)
Total Protein: 6.5 g/dL (ref 6.0–8.3)

## 2014-08-09 LAB — URINALYSIS, ROUTINE W REFLEX MICROSCOPIC
Bilirubin Urine: NEGATIVE
GLUCOSE, UA: NEGATIVE mg/dL
Hgb urine dipstick: NEGATIVE
Ketones, ur: NEGATIVE mg/dL
LEUKOCYTES UA: NEGATIVE
Nitrite: NEGATIVE
PROTEIN: NEGATIVE mg/dL
Specific Gravity, Urine: >1.03 — ABNORMAL HIGH (ref 1.005–1.030)
UROBILINOGEN UA: 0.2 mg/dL (ref 0.0–1.0)
pH: 5 (ref 5.0–8.0)

## 2014-08-09 LAB — CBG MONITORING, ED: Glucose-Capillary: 157 mg/dL — ABNORMAL HIGH (ref 70–99)

## 2014-08-09 LAB — TROPONIN I: Troponin I: <0.03 ng/mL (ref <0.031)

## 2014-08-09 LAB — LIPASE, BLOOD: LIPASE: 29 U/L (ref 11–59)

## 2014-08-09 MED ORDER — SODIUM CHLORIDE 0.9 % IV BOLUS (SEPSIS)
1000.0000 mL | Freq: Once | INTRAVENOUS | Status: AC
  Administered 2014-08-09: 1000 mL via INTRAVENOUS

## 2014-08-09 MED ORDER — ONDANSETRON HCL 4 MG/2ML IJ SOLN
4.0000 mg | Freq: Once | INTRAMUSCULAR | Status: AC
  Administered 2014-08-09: 4 mg via INTRAVENOUS
  Filled 2014-08-09: qty 2

## 2014-08-09 MED ORDER — MECLIZINE HCL 12.5 MG PO TABS
25.0000 mg | ORAL_TABLET | Freq: Once | ORAL | Status: AC
  Administered 2014-08-09: 25 mg via ORAL
  Filled 2014-08-09: qty 2

## 2014-08-09 MED ORDER — SODIUM CHLORIDE 0.9 % IV SOLN
INTRAVENOUS | Status: DC
  Administered 2014-08-09: 20:00:00 via INTRAVENOUS

## 2014-08-09 NOTE — ED Notes (Signed)
Ambulated in unit.  HR up to 103, SpO2 97%, RR 30.  Noted 2 sec episode of bigeminal PJCs. Patient experienced vertigo, becoming nauseated upon return to room.  Slightly unsteady w/ambulation.

## 2014-08-09 NOTE — ED Notes (Signed)
Bladder scan volume 86 cc

## 2014-08-09 NOTE — ED Provider Notes (Signed)
CSN: [id number]     Arrival date & time 08/09/14  1730 History   First MD Initiated Contact with Patient 08/09/14 1746     Chief Complaint  Patient presents with  . Dizziness     (Consider location/radiation/quality/duration/timing/severity/associated sxs/prior Treatment) HPI Comments: Pt with acute onset vomiting after eating--slight lower abd pain with bilious or bloody emesis--no diarrhea--denies anginal chest pain, pt notes dizziness, blurred vision, and decreased urine outpit ( on flomax for bph) --notes generalized weakness with abd discomfort and bloating--sx persistent and nothing made them better or worse, no tx used pta  Patient is a 59 y.o. male presenting with dizziness. The history is provided by the patient.  Dizziness   Past Medical History  Diagnosis Date  . Hypertension   . Hyperlipidemia   . Obesity   . Acid reflux disease   . Gastritis   . BPH (benign prostatic hypertrophy)   . Vertigo   . Normal nuclear stress test Dec 2011  . Type II diabetes mellitus   . H/O hiatal hernia   . TIA (transient ischemic attack) 05/15/2012    "they think I've had one this am @ 0130" (05/15/2012)   Past Surgical History  Procedure Laterality Date  . Cardiovascular stress test  2009    EF 63%  . No past surgeries     Family History  Problem Relation Age of Onset  . Hyperlipidemia Father   . Colon cancer Father   . Stroke Mother    History  Substance Use Topics  . Smoking status: Never Smoker   . Smokeless tobacco: Never Used  . Alcohol Use: No    Review of Systems  Neurological: Positive for dizziness.  All other systems reviewed and are negative.     Allergies  Review of patient's allergies indicates no known allergies.  Home Medications   Prior to Admission medications   Medication Sig Start Date End Date Taking? Authorizing Provider  aspirin 81 MG tablet Take 1 tablet (81 mg total) by mouth daily. 06/11/13   Thayer Headings, MD  atenolol (TENORMIN) 100 MG  tablet Take 100 mg by mouth 2 (two) times daily.      Historical Provider, MD  carbamazepine (EPITOL) 200 MG tablet Take 200 mg by mouth 2 (two) times daily.    Historical Provider, MD  Cholecalciferol (VITAMIN D3) 5000 UNITS TABS Take 5,000 Units by mouth daily.    Historical Provider, MD  FLUoxetine (PROZAC) 20 MG capsule Take 20 mg by mouth daily.    Historical Provider, MD  glipiZIDE (GLUCOTROL) 10 MG tablet Take 10 mg by mouth daily.     Historical Provider, MD  lamoTRIgine (LAMICTAL) 200 MG tablet Take 200 mg by mouth 2 (two) times daily.    Historical Provider, MD  LORazepam (ATIVAN) 1 MG tablet Take 1 mg by mouth 3 (three) times daily. 06/06/14   Historical Provider, MD  losartan (COZAAR) 100 MG tablet Take 1 tablet (100 mg total) by mouth daily. 06/11/14   Thayer Headings, MD  meclizine (ANTIVERT) 25 MG tablet Take 25 mg by mouth 2 (two) times daily as needed for dizziness.     Historical Provider, MD  metFORMIN (GLUCOPHAGE) 1000 MG tablet Take 1,000 mg by mouth 2 (two) times daily with a meal.      Historical Provider, MD  metoCLOPramide (REGLAN) 5 MG tablet Take 5 mg by mouth 2 (two) times daily.     Historical Provider, MD  pantoprazole (PROTONIX) 40 MG tablet Take  40 mg by mouth 2 (two) times daily.     Historical Provider, MD  rosuvastatin (CRESTOR) 40 MG tablet Take 40 mg by mouth daily.    Historical Provider, MD  Tamsulosin HCl (FLOMAX) 0.4 MG CAPS Take 0.4 mg by mouth 2 (two) times daily.    Historical Provider, MD  ZETIA 10 MG tablet Take 10 mg by mouth daily. 05/20/14   Historical Provider, MD  zolpidem (AMBIEN) 10 MG tablet Take 1 tablet (10 mg total) by mouth at bedtime as needed for sleep. 08/15/12   Leonard Schwartz, MD   BP 155/96 mmHg  Pulse 90  Temp(Src) 98.7 F (37.1 C) (Oral)  Resp 20  Ht 5\' 10"  (1.778 m)  Wt 238 lb (107.956 kg)  BMI 34.15 kg/m2  SpO2 98% Physical Exam  Constitutional: He is oriented to person, place, and time. He appears well-developed and  well-nourished.  Non-toxic appearance. No distress.  HENT:  Head: Normocephalic and atraumatic.  Eyes: Conjunctivae, EOM and lids are normal. Pupils are equal, round, and reactive to light.  Neck: Normal range of motion. Neck supple. No tracheal deviation present. No thyroid mass present.  Cardiovascular: Normal rate, regular rhythm and normal heart sounds.  Exam reveals no gallop.   No murmur heard. Pulmonary/Chest: Effort normal and breath sounds normal. No stridor. No respiratory distress. He has no decreased breath sounds. He has no wheezes. He has no rhonchi. He has no rales.  Abdominal: Soft. Normal appearance and bowel sounds are normal. He exhibits no distension. There is no tenderness. There is no rigidity, no rebound, no guarding and no CVA tenderness.  Musculoskeletal: Normal range of motion. He exhibits no edema or tenderness.  Neurological: He is alert and oriented to person, place, and time. He has normal strength. No cranial nerve deficit or sensory deficit. GCS eye subscore is 4. GCS verbal subscore is 5. GCS motor subscore is 6.  Skin: Skin is warm and dry. No abrasion and no rash noted.  Psychiatric: He has a normal mood and affect. His speech is normal and behavior is normal.  Nursing note and vitals reviewed.   ED Course  Procedures (including critical care time) Labs Review Labs Reviewed  CBG MONITORING, ED - Abnormal; Notable for the following:    Glucose-Capillary 157 (*)    All other components within normal limits  TROPONIN I  CBC WITH DIFFERENTIAL/PLATELET  LIPASE, BLOOD  COMPREHENSIVE METABOLIC PANEL    Imaging Review No results found.   EKG Interpretation   Date/Time:  Friday August 09 2014 17:41:03 EDT Ventricular Rate:  91 PR Interval:  161 QRS Duration: 111 QT Interval:  370 QTC Calculation: 455 R Axis:   -141 Text Interpretation:  Sinus rhythm Probable RVH w/ secondary repol  abnormality Inferior infarct, old No significant change since last  tracing  Confirmed by Raynelle Fujikawa  MD, Maripat Borba ([id number]) on 08/09/2014 5:53:48 PM      MDM   Final diagnoses:  None    Patient has CT findings as noted. Patient was off balance when attempting to ambulate. Will require MRI and workup for possible CVA  Lacretia Leigh, MD 08/09/14 2253

## 2014-08-09 NOTE — ED Notes (Signed)
Pt reports was at [facility] at approx 4:30 and started having generalized weakness, blurred vision, and dizzines.  Reports hasn't "felt well" in the past few days.  Pt drove himself to the ER but staggered to the registration desk.  Pt denies pain.  Also reports n/v after eating.

## 2014-08-10 ENCOUNTER — Inpatient Hospital Stay (HOSPITAL_COMMUNITY): Payer: Medicare Other

## 2014-08-10 ENCOUNTER — Encounter (HOSPITAL_COMMUNITY): Payer: Self-pay

## 2014-08-10 DIAGNOSIS — E785 Hyperlipidemia, unspecified: Secondary | ICD-10-CM | POA: Diagnosis present

## 2014-08-10 DIAGNOSIS — I10 Essential (primary) hypertension: Secondary | ICD-10-CM

## 2014-08-10 DIAGNOSIS — Z8673 Personal history of transient ischemic attack (TIA), and cerebral infarction without residual deficits: Secondary | ICD-10-CM | POA: Diagnosis not present

## 2014-08-10 DIAGNOSIS — E118 Type 2 diabetes mellitus with unspecified complications: Secondary | ICD-10-CM | POA: Diagnosis present

## 2014-08-10 DIAGNOSIS — Z823 Family history of stroke: Secondary | ICD-10-CM | POA: Diagnosis not present

## 2014-08-10 DIAGNOSIS — K219 Gastro-esophageal reflux disease without esophagitis: Secondary | ICD-10-CM | POA: Diagnosis present

## 2014-08-10 DIAGNOSIS — G458 Other transient cerebral ischemic attacks and related syndromes: Secondary | ICD-10-CM

## 2014-08-10 DIAGNOSIS — Z6834 Body mass index (BMI) 34.0-34.9, adult: Secondary | ICD-10-CM | POA: Diagnosis not present

## 2014-08-10 DIAGNOSIS — G459 Transient cerebral ischemic attack, unspecified: Secondary | ICD-10-CM | POA: Diagnosis present

## 2014-08-10 DIAGNOSIS — E669 Obesity, unspecified: Secondary | ICD-10-CM | POA: Diagnosis present

## 2014-08-10 DIAGNOSIS — R42 Dizziness and giddiness: Secondary | ICD-10-CM

## 2014-08-10 DIAGNOSIS — Z7982 Long term (current) use of aspirin: Secondary | ICD-10-CM | POA: Diagnosis not present

## 2014-08-10 DIAGNOSIS — Z800 Family history of malignant neoplasm of digestive organs: Secondary | ICD-10-CM | POA: Diagnosis not present

## 2014-08-10 LAB — GLUCOSE, CAPILLARY
GLUCOSE-CAPILLARY: 213 mg/dL — AB (ref 70–99)
Glucose-Capillary: 138 mg/dL — ABNORMAL HIGH (ref 70–99)
Glucose-Capillary: 169 mg/dL — ABNORMAL HIGH (ref 70–99)
Glucose-Capillary: 173 mg/dL — ABNORMAL HIGH (ref 70–99)

## 2014-08-10 LAB — LIPID PANEL
CHOL/HDL RATIO: 4.2 ratio
Cholesterol: 176 mg/dL (ref 0–200)
HDL: 42 mg/dL (ref >39)
LDL Cholesterol: 57 mg/dL (ref 0–99)
Triglycerides: 386 mg/dL — ABNORMAL HIGH (ref <150)
VLDL: 77 mg/dL — ABNORMAL HIGH (ref 0–40)

## 2014-08-10 LAB — RAPID URINE DRUG SCREEN, HOSP PERFORMED
AMPHETAMINES: NOT DETECTED
Barbiturates: NOT DETECTED
Benzodiazepines: POSITIVE — AB
COCAINE: NOT DETECTED
OPIATES: NOT DETECTED
TETRAHYDROCANNABINOL: NOT DETECTED

## 2014-08-10 MED ORDER — INSULIN ASPART 100 UNIT/ML ~~LOC~~ SOLN
0.0000 [IU] | Freq: Three times a day (TID) | SUBCUTANEOUS | Status: DC
  Administered 2014-08-10: 1 [IU] via SUBCUTANEOUS
  Administered 2014-08-10: 3 [IU] via SUBCUTANEOUS
  Administered 2014-08-10: 2 [IU] via SUBCUTANEOUS
  Administered 2014-08-11 (×2): 1 [IU] via SUBCUTANEOUS

## 2014-08-10 MED ORDER — LAMOTRIGINE 100 MG PO TABS
400.0000 mg | ORAL_TABLET | Freq: Every day | ORAL | Status: DC
  Administered 2014-08-10: 400 mg via ORAL
  Filled 2014-08-10 (×3): qty 2

## 2014-08-10 MED ORDER — ZOLPIDEM TARTRATE 5 MG PO TABS
10.0000 mg | ORAL_TABLET | Freq: Every evening | ORAL | Status: DC | PRN
  Administered 2014-08-10 (×2): 10 mg via ORAL
  Filled 2014-08-10 (×2): qty 2

## 2014-08-10 MED ORDER — TAMSULOSIN HCL 0.4 MG PO CAPS
0.4000 mg | ORAL_CAPSULE | Freq: Two times a day (BID) | ORAL | Status: DC
  Administered 2014-08-10 – 2014-08-11 (×4): 0.4 mg via ORAL
  Filled 2014-08-10 (×4): qty 1

## 2014-08-10 MED ORDER — STROKE: EARLY STAGES OF RECOVERY BOOK
Freq: Once | Status: AC
  Administered 2014-08-10: 11:00:00
  Filled 2014-08-10: qty 1

## 2014-08-10 MED ORDER — METOCLOPRAMIDE HCL 10 MG PO TABS
5.0000 mg | ORAL_TABLET | Freq: Every morning | ORAL | Status: DC
  Administered 2014-08-10 – 2014-08-11 (×2): 5 mg via ORAL
  Filled 2014-08-10 (×2): qty 1

## 2014-08-10 MED ORDER — FLUOXETINE HCL 20 MG PO CAPS
20.0000 mg | ORAL_CAPSULE | Freq: Every day | ORAL | Status: DC
  Administered 2014-08-10 – 2014-08-11 (×2): 20 mg via ORAL
  Filled 2014-08-10 (×2): qty 1

## 2014-08-10 MED ORDER — ALPRAZOLAM 1 MG PO TABS
1.0000 mg | ORAL_TABLET | Freq: Three times a day (TID) | ORAL | Status: DC | PRN
  Administered 2014-08-10 – 2014-08-11 (×4): 1 mg via ORAL
  Filled 2014-08-10 (×4): qty 1

## 2014-08-10 MED ORDER — ENOXAPARIN SODIUM 40 MG/0.4ML ~~LOC~~ SOLN
40.0000 mg | SUBCUTANEOUS | Status: DC
  Administered 2014-08-10 – 2014-08-11 (×2): 40 mg via SUBCUTANEOUS
  Filled 2014-08-10 (×2): qty 0.4

## 2014-08-10 MED ORDER — ONDANSETRON HCL 4 MG PO TABS: 4.0000 mg | ORAL_TABLET | Freq: Four times a day (QID) | ORAL | Status: DC | PRN

## 2014-08-10 MED ORDER — PANTOPRAZOLE SODIUM 40 MG PO TBEC
40.0000 mg | DELAYED_RELEASE_TABLET | Freq: Every morning | ORAL | Status: DC
  Administered 2014-08-10 – 2014-08-11 (×2): 40 mg via ORAL
  Filled 2014-08-10 (×2): qty 1

## 2014-08-10 MED ORDER — ROSUVASTATIN CALCIUM 20 MG PO TABS
40.0000 mg | ORAL_TABLET | Freq: Every day | ORAL | Status: DC
  Administered 2014-08-10 – 2014-08-11 (×2): 40 mg via ORAL
  Filled 2014-08-10 (×2): qty 2

## 2014-08-10 MED ORDER — ASPIRIN 325 MG PO TABS
325.0000 mg | ORAL_TABLET | Freq: Every day | ORAL | Status: DC
Start: 2014-08-10 — End: 2014-08-11
  Administered 2014-08-10 – 2014-08-11 (×2): 325 mg via ORAL
  Filled 2014-08-10 (×2): qty 1

## 2014-08-10 MED ORDER — EZETIMIBE 10 MG PO TABS
10.0000 mg | ORAL_TABLET | Freq: Every day | ORAL | Status: DC
  Administered 2014-08-10 – 2014-08-11 (×2): 10 mg via ORAL
  Filled 2014-08-10 (×2): qty 1

## 2014-08-10 MED ORDER — ACETAMINOPHEN 325 MG PO TABS
650.0000 mg | ORAL_TABLET | Freq: Four times a day (QID) | ORAL | Status: DC | PRN
  Administered 2014-08-10 – 2014-08-11 (×3): 650 mg via ORAL
  Filled 2014-08-10 (×3): qty 2

## 2014-08-10 MED ORDER — ONDANSETRON HCL 4 MG/2ML IJ SOLN
4.0000 mg | Freq: Four times a day (QID) | INTRAMUSCULAR | Status: DC | PRN
  Administered 2014-08-10: 4 mg via INTRAVENOUS
  Filled 2014-08-10: qty 2

## 2014-08-10 MED ORDER — CARBAMAZEPINE 200 MG PO TABS
200.0000 mg | ORAL_TABLET | Freq: Two times a day (BID) | ORAL | Status: DC
  Administered 2014-08-10 – 2014-08-11 (×4): 200 mg via ORAL
  Filled 2014-08-10 (×4): qty 1

## 2014-08-10 NOTE — Progress Notes (Signed)
Patient seen and examined. Admitted earlier today for a TIA. 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 ECHO and dopplers completed. Await PT eval. Anticipate DC home in am.  Domingo Mend, MD [facility] Pager: [phone]

## 2014-08-10 NOTE — Progress Notes (Signed)
*  PRELIMINARY RESULTS* Echocardiogram 2D Echocardiogram has been performed.  Leavy Cella 08/10/2014, 9:49 AM

## 2014-08-10 NOTE — H&P (Signed)
PCP:   No primary care provider on file.   Chief Complaint:  Dizziness  HPI:  59 year old male who  has a past medical history of Hypertension; Hyperlipidemia; Obesity; Acid reflux disease; Gastritis; BPH (benign prostatic hypertrophy); Vertigo; Normal nuclear stress test (Dec 2011); Type II diabetes mellitus; H/O hiatal hernia; and TIA (transient ischemic attack) (05/15/2012). Today came to the ED with chief complaint of dizziness which started after patient ate at [employer]. Patient says that he had vomiting after eating, denies passing out. There was no focal weakness, no slurred speech. Patient says that he drove home but he was not feeling well so he came to the ED for further evaluation. In the ED CT head was done which showed age indeterminate infarct within the anterior limb of the right internal capsule. Patient symptoms have resolved at this time. He denies fever, no dysuria no chest pain no shortness of breath.  Allergies:  No Known Allergies    Past Medical History  Diagnosis Date  . Hypertension   . Hyperlipidemia   . Obesity   . Acid reflux disease   . Gastritis   . BPH (benign prostatic hypertrophy)   . Vertigo   . Normal nuclear stress test Dec 2011  . Type II diabetes mellitus   . H/O hiatal hernia   . TIA (transient ischemic attack) 05/15/2012    "they think I've had one this am @ 0130" (05/15/2012)    Past Surgical History  Procedure Laterality Date  . Cardiovascular stress test  2009    EF 63%  . No past surgeries      Prior to Admission medications   Medication Sig Start Date End Date Taking? Authorizing Provider  ALPRAZolam Duanne Moron) 1 MG tablet Take 1 mg by mouth 3 (three) times daily as needed for anxiety.  05/22/14  Yes Historical Provider, MD  aspirin 81 MG tablet Take 1 tablet (81 mg total) by mouth daily. 06/11/13  Yes Thayer Headings, MD  atenolol (TENORMIN) 100 MG tablet Take 100 mg by mouth 2 (two) times daily.     Yes Historical Provider, MD    carbamazepine (EPITOL) 200 MG tablet Take 200 mg by mouth 2 (two) times daily.   Yes Historical Provider, MD  Cholecalciferol (VITAMIN D) 2000 UNITS CAPS Take 1 capsule by mouth daily.   Yes Historical Provider, MD  FLUoxetine (PROZAC) 20 MG capsule Take 20 mg by mouth daily.   Yes Historical Provider, MD  glipiZIDE (GLUCOTROL) 10 MG tablet Take 10 mg by mouth daily.    Yes Historical Provider, MD  lamoTRIgine (LAMICTAL) 200 MG tablet Take 400 mg by mouth at bedtime.    Yes Historical Provider, MD  losartan (COZAAR) 100 MG tablet Take 1 tablet (100 mg total) by mouth daily. 06/11/14  Yes Thayer Headings, MD  meclizine (ANTIVERT) 25 MG tablet Take 25 mg by mouth 2 (two) times daily as needed for dizziness.    Yes Historical Provider, MD  metFORMIN (GLUCOPHAGE) 1000 MG tablet Take 1,000 mg by mouth 2 (two) times daily with a meal.     Yes Historical Provider, MD  metoCLOPramide (REGLAN) 5 MG tablet Take 5 mg by mouth every morning.    Yes Historical Provider, MD  pantoprazole (PROTONIX) 40 MG tablet Take 40 mg by mouth every morning.    Yes Historical Provider, MD  rosuvastatin (CRESTOR) 40 MG tablet Take 40 mg by mouth daily.   Yes Historical Provider, MD  Tamsulosin HCl (FLOMAX) 0.4 MG  CAPS Take 0.4 mg by mouth 2 (two) times daily.   Yes Historical Provider, MD  ZETIA 10 MG tablet Take 10 mg by mouth daily. 05/20/14  Yes Historical Provider, MD  zolpidem (AMBIEN) 10 MG tablet Take 1 tablet (10 mg total) by mouth at bedtime as needed for sleep. Patient taking differently: Take 10 mg by mouth at bedtime.  08/15/12  Yes Leonard Schwartz, MD  LORazepam (ATIVAN) 1 MG tablet Take 1 mg by mouth 3 (three) times daily. 06/06/14   Historical Provider, MD    Social History:  reports that he has never smoked. He has never used smokeless tobacco. He reports that he does not drink alcohol or use illicit drugs.  Family History  Problem Relation Age of Onset  . Hyperlipidemia Father   . Colon cancer Father   .  Stroke Mother      All the positives are listed in BOLD  Review of Systems:  HEENT: Headache, blurred vision, runny nose, sore throat Neck: Hypothyroidism, hyperthyroidism,,lymphadenopathy Chest : Shortness of breath, history of COPD, Asthma Heart : Chest pain, history of coronary arterey disease GI:  Nausea, vomiting, diarrhea, constipation, GERD GU: Dysuria, urgency, frequency of urination, hematuria Neuro: Stroke, seizures, syncope Psych: Depression, anxiety, hallucinations   Physical Exam: Blood pressure 157/99, pulse 96, temperature 98.7 F (37.1 C), temperature source Oral, resp. rate 19, height 5\' 10"  (1.778 m), weight 107.956 kg (238 lb), SpO2 99 %. Constitutional:   Patient is a well-developed and well-nourished male in no acute distress and cooperative with exam. Head: Normocephalic and atraumatic Mouth: Mucus membranes moist Eyes: PERRL, EOMI, conjunctivae normal Neck: Supple, No Thyromegaly Cardiovascular: RRR, S1 normal, S2 normal Pulmonary/Chest: CTAB, no wheezes, rales, or rhonchi Abdominal: Soft. Non-tender, non-distended, bowel sounds are normal, no masses, organomegaly, or guarding present.  Neurological: A&O x3, Strength is normal and symmetric bilaterally, cranial nerve II-XII are grossly intact, no focal motor deficit, sensory intact to light touch bilaterally.  Extremities : No Cyanosis, Clubbing or Edema  Labs on Admission:  Basic Metabolic Panel:  Recent Labs Lab 08/09/14 1809  NA 138  K 5.1  CL 106  CO2 22  GLUCOSE 191*  BUN 21  CREATININE 1.21  CALCIUM 8.9   Liver Function Tests:  Recent Labs Lab 08/09/14 1809  AST 31  ALT 32  ALKPHOS 92  BILITOT 0.6  PROT 6.5  ALBUMIN 3.9    Recent Labs Lab 08/09/14 1809  LIPASE 29   No results for input(s): AMMONIA in the last 168 hours. CBC:  Recent Labs Lab 08/09/14 1809  WBC 6.1  NEUTROABS 4.0  HGB 12.8*  HCT 37.5*  MCV 91.2  PLT 106*   Cardiac Enzymes:  Recent Labs Lab  08/09/14 1809  TROPONINI <0.03     CBG:  Recent Labs Lab 08/09/14 1739  GLUCAP 157*    Radiological Exams on Admission: Ct Head Wo Contrast  08/09/2014   CLINICAL DATA:  Dizziness and blurred vision beginning at 16 30.  EXAM: CT HEAD WITHOUT CONTRAST  TECHNIQUE: Contiguous axial images were obtained from the base of the skull through the vertex without intravenous contrast.  COMPARISON:  MRI brain 05/15/2012.  FINDINGS: An age indeterminate lacunar infarct is present within the anterior limb of the right internal capsule. Mild atrophy and white matter changes are otherwise stable. No other acute cortical infarct, hemorrhage, or mass lesion is present. The ventricles are of normal size. No significant extraaxial fluid collection is present.  The paranasal sinuses and mastoid  air cells are clear. Atherosclerotic calcifications are present within the cavernous internal carotid arteries.  IMPRESSION: 1. 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 2. Otherwise stable atrophy and white matter disease. 3. Atherosclerosis.   Electronically Signed   By: San Morelle M.D.   On: 08/09/2014 22:33    EKG: Independently reviewed. Sinus rhythm   Assessment/Plan Active Problems:   HTN (hypertension)   TIA (transient ischemic attack)   Diabetes mellitus type 2 with complications   Dizziness  TIA versus stroke We'll admit the patient for further workup, patient's symptoms have completely resolved. CT head showed age indeterminate Infarct within the anterior limb of the right internal capsule. Patient has significant risk factors including diabetes, hyperlipidemia, hypertension. We will obtain MRI/MRA brain, 2-D echo, carotid Dopplers. Check fasting lipid profile and hemoglobin A1c in a.m. We'll start full dose aspirin 325 mg by mouth daily.  Diabetes mellitus Hold oral hypoglycemics, initiate sliding scale insulin with NovoLog.  Hypertension Hold  antihypertensive medications for permissive hypertension.  DVT prophylaxis Lovenox  Code status: Full code  Family discussion: No family at bedside   Time Spent on Admission: 60 minutes  Sterling Ucci S [facility] Pager: [phone] 08/10/2014, 12:55 AM  If 7PM-7AM, please contact night-coverage  [email or web address]  Password [email or web address]

## 2014-08-11 LAB — GLUCOSE, CAPILLARY
GLUCOSE-CAPILLARY: 143 mg/dL — AB (ref 70–99)
GLUCOSE-CAPILLARY: 148 mg/dL — AB (ref 70–99)

## 2014-08-11 NOTE — Discharge Summary (Signed)
Physician Discharge Summary  Robert Walters UMP:[id number] DOB: 01/05/1956 DOA: 08/09/2014  PCP: No primary care provider on file.  Admit date: 08/09/2014 Discharge date: 08/11/2014  Time spent: 45 minutes  Recommendations for Outpatient Follow-up:  -Will be discharged home today. -SNF placement was recommended for short-term rehabilitation, however patient refuses and instead home health services will be arranged.   Discharge Diagnoses:  Active Problems:   HTN (hypertension)   TIA (transient ischemic attack)   Diabetes mellitus type 2 with complications   Dizziness   Discharge Condition: Stable and improved   Filed Weights   08/09/14 1742  Weight: 107.956 kg (238 lb)    History of present illness:  59 year old male who  has a past medical history of Hypertension; Hyperlipidemia; Obesity; Acid reflux disease; Gastritis; BPH (benign prostatic hypertrophy); Vertigo; Normal nuclear stress test (Dec 2011); Type II diabetes mellitus; H/O hiatal hernia; and TIA (transient ischemic attack) (05/15/2012). Today came to the ED with chief complaint of dizziness which started after patient ate at [employer]. Patient says that he had vomiting after eating, denies passing out. There was no focal weakness, no slurred speech. Patient says that he drove home but he was not feeling well so he came to the ED for further evaluation. In the ED CT head was done which showed age indeterminate infarct within the anterior limb of the right internal capsule. Patient symptoms have resolved at this time. He denies fever, no dysuria no chest pain no shortness of breath.  Hospital Course:   TIA -Dizziness has resolved. -MRI with no acute infarct however there is a small infarct of the anterior right lenticular nucleus that is remote but new from previous MRI in 2014. -2-D echo with EF of 60-65% with no wall motion abnormalities and grade 1 diastolic dysfunction. -Carotid Dopplers show interval progression of  moderate left-sided atherosclerotic plaque though not resulting in hemodynamically significant stenosis. Unremarkable right carotid system. -Has been started on aspirin for secondary stroke prophylaxis.  Generalized weakness/deconditioning -Has been seen by physical therapy with recommendations for SNF, however patient refuses. Will arrange home health services instead.  Hypertension -Resume given lack of CVA and rising BPs.  Type 2 diabetes -Well-controlled.  Procedures:  As above   Consultations: None   Discharge Instructions  Discharge Instructions    Continue foley catheter    Complete by:  As directed      Diet - low sodium heart healthy    Complete by:  As directed      Increase activity slowly    Complete by:  As directed             Medication List    STOP taking these medications        LORazepam 1 MG tablet  Commonly known as:  ATIVAN     meclizine 25 MG tablet  Commonly known as:  ANTIVERT      TAKE these medications        ALPRAZolam 1 MG tablet  Commonly known as:  XANAX  Take 1 mg by mouth 3 (three) times daily as needed for anxiety.     aspirin 81 MG tablet  Take 1 tablet (81 mg total) by mouth daily.     atenolol 100 MG tablet  Commonly known as:  TENORMIN  Take 100 mg by mouth 2 (two) times daily.     EPITOL 200 MG tablet  Generic drug:  carbamazepine  Take 200 mg by mouth 2 (two) times daily.  FLUoxetine 20 MG capsule  Commonly known as:  PROZAC  Take 20 mg by mouth daily.     glipiZIDE 10 MG tablet  Commonly known as:  GLUCOTROL  Take 10 mg by mouth daily.     lamoTRIgine 200 MG tablet  Commonly known as:  LAMICTAL  Take 400 mg by mouth at bedtime.     losartan 100 MG tablet  Commonly known as:  COZAAR  Take 1 tablet (100 mg total) by mouth daily.     metFORMIN 1000 MG tablet  Commonly known as:  GLUCOPHAGE  Take 1,000 mg by mouth 2 (two) times daily with a meal.     metoCLOPramide 5 MG tablet  Commonly known as:   REGLAN  Take 5 mg by mouth every morning.     pantoprazole 40 MG tablet  Commonly known as:  PROTONIX  Take 40 mg by mouth every morning.     rosuvastatin 40 MG tablet  Commonly known as:  CRESTOR  Take 40 mg by mouth daily.     tamsulosin 0.4 MG Caps capsule  Commonly known as:  FLOMAX  Take 0.4 mg by mouth 2 (two) times daily.     Vitamin D 2000 UNITS Caps  Take 1 capsule by mouth daily.     ZETIA 10 MG tablet  Generic drug:  ezetimibe  Take 10 mg by mouth daily.     zolpidem 10 MG tablet  Commonly known as:  AMBIEN  Take 1 tablet (10 mg total) by mouth at bedtime as needed for sleep.       No Known Allergies     Follow-up Information    Follow up with Jorja Loa, MD. Schedule an appointment as soon as possible for a visit in 1 week.   Specialty:  Urology   Contact information:   [place] [id number] [phone]       Follow up In 2 weeks.   Why:  with your primary care provider       The results of significant diagnostics from this hospitalization (including imaging, microbiology, ancillary and laboratory) are listed below for reference.    Significant Diagnostic Studies: Ct Head Wo Contrast  08/09/2014   CLINICAL DATA:  Dizziness and blurred vision beginning at 16 30.  EXAM: CT HEAD WITHOUT CONTRAST  TECHNIQUE: Contiguous axial images were obtained from the base of the skull through the vertex without intravenous contrast.  COMPARISON:  MRI brain 05/15/2012.  FINDINGS: An age indeterminate lacunar infarct is present within the anterior limb of the right internal capsule. Mild atrophy and white matter changes are otherwise stable. No other acute cortical infarct, hemorrhage, or mass lesion is present. The ventricles are of normal size. No significant extraaxial fluid collection is present.  The paranasal sinuses and mastoid air cells are clear. Atherosclerotic calcifications are present within the cavernous internal carotid arteries.   IMPRESSION: 1. 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 2. Otherwise stable atrophy and white matter disease. 3. Atherosclerosis.   Electronically Signed   By: San Morelle M.D.   On: 08/09/2014 22:33   Mri Brain Without Contrast  08/10/2014   CLINICAL DATA:  59 year old diabetic hypertensive obese male with hyperlipidemia experienced dizziness and blurred vision with not feeling right for 2 hours. Subsequent encounter.  EXAM: MRI HEAD WITHOUT CONTRAST  MRA HEAD WITHOUT CONTRAST  TECHNIQUE: Multiplanar, multiecho pulse sequences of the brain and surrounding structures were obtained without intravenous  contrast. Angiographic images of the head were obtained using MRA technique without contrast.  COMPARISON:  08/09/2014 head CT.  05/15/2012 brain MR.  FINDINGS: MRI HEAD FINDINGS  No acute infarct.  No intracranial hemorrhage.  New from the prior MR although appearing remote is a small infarct of the anterior right lenticular nucleus/ caudate head.  Nonspecific white matter type changes most notable periventricular region, central pons and anterior left temporal lobe. Involvement of the temporal lobe can be seen with CADASIL (cerebral autosomal dominant arteriopathy with subcortical infarcts and leukoencephalopathy) however, given the patient's risk factors, findings are probably related to result of small vessel disease.  No intracranial mass lesion noted on this unenhanced exam.  Mild atrophy most notable frontal and temporal region without evidence of hydrocephalus.  Partial opacification mastoid air cells greater on the right. No obstructing lesion of the eustachian tube noted. Polypoid opacification inferior aspect left maxillary sinus. Minimal partial opacification ethmoid sinus air cells.  Small pituitary gland without sella expansion.  Cerebellar tonsils minimally low lying but within the range of normal limits.  Pineal region and orbital structures  unremarkable.  Ossification of the anterior aspect of the falx incidentally noted.  MRA HEAD FINDINGS  Anterior circulation without medium or large size vessel significant stenosis or occlusion.  Small bulge at the level of the anterior communicating artery level unchanged. On source images, this appears to be origin of a vessel rather than an aneurysm.  Middle cerebral artery branch vessel irregularity and narrowing bilaterally.  No significant stenosis of the distal vertebral arteries or basilar artery.  Poor delineation of the posterior inferior cerebellar arteries.  Narrowing of the superior cerebellar arteries more notable on the left.  Narrowing of the distal posterior cerebral artery branch vessels.  IMPRESSION: MRI HEAD  No acute infarct.  New from the prior MR although appearing remote is a small infarct of the anterior right lenticular nucleus/ caudate head.  Nonspecific white matter type changes most notable periventricular region, central pons and anterior left temporal lobe probably related to result of small vessel disease as noted above.  Partial opacification mastoid air cells greater on the right. Polypoid opacification inferior aspect left maxillary sinus. Minimal partial opacification ethmoid sinus air cells.  Small pituitary gland without sella expansion.  MRA HEAD  Branch vessel atherosclerotic type changes suspected as detailed above.   Electronically Signed   By: Genia Del M.D.   On: 08/10/2014 13:10   US Carotid Duplex Bilateral  08/10/2014   CLINICAL DATA:  TIA. History of hypertension, hyperlipidemia and diabetes.  EXAM: BILATERAL CAROTID DUPLEX ULTRASOUND  TECHNIQUE: Pearline Cables scale imaging, color Doppler and duplex ultrasound were performed of bilateral carotid and vertebral arteries in the neck.  COMPARISON:  Carotid Doppler ultrasound - 08/09/2014  FINDINGS: Examination is degraded due to patient body habitus and poor sonographic window.  Criteria: Quantification of carotid stenosis is  based on velocity parameters that correlate the residual internal carotid diameter with NASCET-based stenosis levels, using the diameter of the distal internal carotid lumen as the denominator for stenosis measurement.  The following velocity measurements were obtained:  RIGHT  ICA:  73/26 cm/sec  CCA:  82/95 cm/sec  SYSTOLIC ICA/CCA RATIO:  6.21  DIASTOLIC ICA/CCA RATIO:  1.4  ECA:  79 cm/sec  LEFT  ICA:  65/18 cm/sec  CCA:  30/86 cm/sec  SYSTOLIC ICA/CCA RATIO:  5.78  DIASTOLIC ICA/CCA RATIO:  1.6  ECA:  87 cm/sec  RIGHT CAROTID ARTERY: There is no grayscale evidence of  significant intimal thickening or atherosclerotic plaque affecting the interrogated portions of the right carotid system. There are no elevated peak systolic velocities within the interrogated course of the right internal carotid artery to suggest a hemodynamically significant stenosis.  RIGHT VERTEBRAL ARTERY:  Antegrade flow  LEFT CAROTID ARTERY: There is a moderate amount of eccentric mixed echogenic plaque within the left carotid bulb (representative images 159, 61 and 62), likely minimally progressed since the 03/2009 examination though not resulting in elevated peak systolic velocities within the interrogated course of the left internal carotid artery to suggest a hemodynamically significant stenosis.  LEFT VERTEBRAL ARTERY:  Antegrade flow  IMPRESSION: 1. Interval progression of now moderate amount of left-sided atherosclerotic plaque, though not resulting in a hemodynamically significant stenosis. 2. Unremarkable sonographic evaluation of the right carotid system.   Electronically Signed   By: Sandi Mariscal M.D.   On: 08/10/2014 12:14   Mr Jodene Nam Head/brain Wo Cm  08/10/2014   CLINICAL DATA:  59 year old diabetic hypertensive obese male with hyperlipidemia experienced dizziness and blurred vision with not feeling right for 2 hours. Subsequent encounter.  EXAM: MRI HEAD WITHOUT CONTRAST  MRA HEAD WITHOUT CONTRAST  TECHNIQUE: Multiplanar,  multiecho pulse sequences of the brain and surrounding structures were obtained without intravenous contrast. Angiographic images of the head were obtained using MRA technique without contrast.  COMPARISON:  08/09/2014 head CT.  05/15/2012 brain MR.  FINDINGS: MRI HEAD FINDINGS  No acute infarct.  No intracranial hemorrhage.  New from the prior MR although appearing remote is a small infarct of the anterior right lenticular nucleus/ caudate head.  Nonspecific white matter type changes most notable periventricular region, central pons and anterior left temporal lobe. Involvement of the temporal lobe can be seen with CADASIL (cerebral autosomal dominant arteriopathy with subcortical infarcts and leukoencephalopathy) however, given the patient's risk factors, findings are probably related to result of small vessel disease.  No intracranial mass lesion noted on this unenhanced exam.  Mild atrophy most notable frontal and temporal region without evidence of hydrocephalus.  Partial opacification mastoid air cells greater on the right. No obstructing lesion of the eustachian tube noted. Polypoid opacification inferior aspect left maxillary sinus. Minimal partial opacification ethmoid sinus air cells.  Small pituitary gland without sella expansion.  Cerebellar tonsils minimally low lying but within the range of normal limits.  Pineal region and orbital structures unremarkable.  Ossification of the anterior aspect of the falx incidentally noted.  MRA HEAD FINDINGS  Anterior circulation without medium or large size vessel significant stenosis or occlusion.  Small bulge at the level of the anterior communicating artery level unchanged. On source images, this appears to be origin of a vessel rather than an aneurysm.  Middle cerebral artery branch vessel irregularity and narrowing bilaterally.  No significant stenosis of the distal vertebral arteries or basilar artery.  Poor delineation of the posterior inferior cerebellar  arteries.  Narrowing of the superior cerebellar arteries more notable on the left.  Narrowing of the distal posterior cerebral artery branch vessels.  IMPRESSION: MRI HEAD  No acute infarct.  New from the prior MR although appearing remote is a small infarct of the anterior right lenticular nucleus/ caudate head.  Nonspecific white matter type changes most notable periventricular region, central pons and anterior left temporal lobe probably related to result of small vessel disease as noted above.  Partial opacification mastoid air cells greater on the right. Polypoid opacification inferior aspect left maxillary sinus. Minimal partial opacification ethmoid sinus air cells.  Small pituitary gland without sella expansion.  MRA HEAD  Branch vessel atherosclerotic type changes suspected as detailed above.   Electronically Signed   By: Genia Del M.D.   On: 08/10/2014 13:10    Microbiology: No results found for this or any previous visit (from the past 240 hour(s)).   Labs: Basic Metabolic Panel:  Recent Labs Lab 08/09/14 1809  NA 138  K 5.1  CL 106  CO2 22  GLUCOSE 191*  BUN 21  CREATININE 1.21  CALCIUM 8.9   Liver Function Tests:  Recent Labs Lab 08/09/14 1809  AST 31  ALT 32  ALKPHOS 92  BILITOT 0.6  PROT 6.5  ALBUMIN 3.9    Recent Labs Lab 08/09/14 1809  LIPASE 29   No results for input(s): AMMONIA in the last 168 hours. CBC:  Recent Labs Lab 08/09/14 1809  WBC 6.1  NEUTROABS 4.0  HGB 12.8*  HCT 37.5*  MCV 91.2  PLT 106*   Cardiac Enzymes:  Recent Labs Lab 08/09/14 1809  TROPONINI <0.03   BNP: BNP (last 3 results) No results for input(s): BNP in the last 8760 hours.  ProBNP (last 3 results) No results for input(s): PROBNP in the last 8760 hours.  CBG:  Recent Labs Lab 08/10/14 0736 08/10/14 1330 08/10/14 1656 08/10/14 2146 08/11/14 0721  GLUCAP 138* 213* 169* 173* 143*       Signed:  HERNANDEZ ACOSTA,ESTELA  [facility] Pager: [phone] 08/11/2014, 11:50 AM

## 2014-08-11 NOTE — Evaluation (Signed)
Physical Therapy Evaluation Patient Details Name: Robert Walters MRN: [id number] DOB: 01/30/1956 Today's Date: 08/11/2014   History of Present Illness  59 year old male who  has a past medical history of Hypertension; Hyperlipidemia; Obesity; Acid reflux disease; Gastritis; BPH (benign prostatic hypertrophy); Vertigo; Normal nuclear stress test (Dec 2011); Type II diabetes mellitus; H/O hiatal hernia; and TIA (transient ischemic attack) (05/15/2012).  Clinical Impression  PT able to complete bed mobility and ambulation with mod I with rolling walker.  Attempted to ambulate without walker but pt did not feel secure.  Pt will benefit from skilled PT to improve his I and balance to allow him to ambulate without any assistive device which is his prior functional level.     Follow Up Recommendations SNF (would recommend OP but pt states he does not feel comfortable driving at this time.)    Equipment Recommendations  Rolling walker with 5" wheels (walker pt has was his fathers and is not in good shape.)       Precautions / Restrictions Precautions Precautions: None Restrictions Weight Bearing Restrictions: No      Bed mobility Modified Independent     Transfers with modified Independence  Gt with rolling walker with modified Independence x 250 ft.    Balance Overall balance assessment: Modified Independent (Single leg stance Rt 6 seonds; Lt 5)                                           Pertinent Vitals/Pain Pain Assessment: No/denies pain    Home Living Family/patient expects to be discharged to:: Private residence Living Arrangements: Alone Available Help at Discharge: Family Type of Home: Mobile home Home Access: Stairs to enter;Ramped entrance Entrance Stairs-Rails: Can reach both Entrance Stairs-Number of Steps: 3 Home Layout: One level Home Equipment: [place] - 2 wheels;Cane - single point;Tub bench      Prior Function Level of Independence:  Independent               Hand Dominance   Dominant Hand: Right    Extremity/Trunk Assessment               Lower Extremity Assessment: Overall WFL for tasks assessed         Communication   Communication: No difficulties  Cognition Arousal/Alertness: Awake/alert   Overall Cognitive Status: Within Functional Limits for tasks assessed                               Assessment/Plan    PT Assessment Patient needs continued PT services  PT Diagnosis Abnormality of gait   PT Problem List Decreased balance;Decreased activity tolerance  PT Treatment Interventions Neuromuscular re-education;Balance training;Patient/family education   PT Goals (Current goals can be found in the Care Plan section)      Frequency Min 3X/week   Barriers to discharge  (lives alone)         End of Session Equipment Utilized During Treatment: Gait belt Activity Tolerance: Patient tolerated treatment well Patient left: in chair;with call bell/phone within reach      Functional Limitation: Mobility: Walking and moving around Mobility: Walking and Moving Around Current Status [phone]): At least 1 percent but less than 20 percent impaired, limited or restricted Mobility: Walking and Moving Around Goal Status [phone]): At least 1 percent but less than 20 percent impaired, limited  or restricted Mobility: Walking and Moving Around Discharge Status [phone]): At least 1 percent but less than 20 percent impaired, limited or restricted    Time: 1025-1058 PT Time Calculation (min) (ACUTE ONLY): 33 min   Charges:   PT Evaluation $Initial PT Evaluation Tier I: 1 Procedure     PT G Codes:   PT G-Codes **NOT FOR INPATIENT CLASS** Functional Limitation: Mobility: Walking and moving around Mobility: Walking and Moving Around Current Status ([id number]): At least 1 percent but less than 20 percent impaired, limited or restricted Mobility: Walking and Moving Around Goal Status [phone]): At  least 1 percent but less than 20 percent impaired, limited or restricted Mobility: Walking and Moving Around Discharge Status [phone]): At least 1 percent but less than 20 percent impaired, limited or restricted  Rayetta Humphrey, PT CLT [phone] 08/11/2014, 11:04 AM

## 2014-08-11 NOTE — Progress Notes (Signed)
Pt discharged home today per Dr. Jerilee Hoh. Pt's IV site D/C'd and WDL. Pt's VSS. Pt provided with home medication list and discharge instructions. Pt verbalized understanding. Pt left floor via WC in stable condition accompanied by NT.

## 2014-08-12 LAB — URINE CULTURE
CULTURE: NO GROWTH
Colony Count: NO GROWTH

## 2014-08-12 LAB — HEMOGLOBIN A1C
Hgb A1c MFr Bld: 6.5 % — ABNORMAL HIGH (ref 4.8–5.6)
MEAN PLASMA GLUCOSE: 140 mg/dL

## 2015-06-16 DIAGNOSIS — I10 Essential (primary) hypertension: Secondary | ICD-10-CM | POA: Diagnosis not present

## 2015-06-16 DIAGNOSIS — E785 Hyperlipidemia, unspecified: Secondary | ICD-10-CM | POA: Diagnosis not present

## 2015-06-16 DIAGNOSIS — E1165 Type 2 diabetes mellitus with hyperglycemia: Secondary | ICD-10-CM | POA: Diagnosis not present

## 2015-07-01 ENCOUNTER — Ambulatory Visit: Payer: Medicare Other | Admitting: Cardiovascular Disease

## 2015-07-02 DIAGNOSIS — I639 Cerebral infarction, unspecified: Secondary | ICD-10-CM

## 2015-07-02 HISTORY — DX: Cerebral infarction, unspecified: I63.9

## 2015-07-22 ENCOUNTER — Other Ambulatory Visit: Payer: Self-pay | Admitting: *Deleted

## 2015-07-22 MED ORDER — LOSARTAN POTASSIUM 100 MG PO TABS: 100.0000 mg | ORAL_TABLET | Freq: Every day | ORAL | Status: DC

## 2015-07-25 ENCOUNTER — Emergency Department (HOSPITAL_COMMUNITY): Payer: Medicare Other

## 2015-07-25 ENCOUNTER — Inpatient Hospital Stay (HOSPITAL_COMMUNITY)
Admission: EM | Admit: 2015-07-25 | Discharge: 2015-07-27 | DRG: 066 | Disposition: A | Discharge status: Home Health | Payer: Medicare Other | Attending: Internal Medicine | Admitting: Internal Medicine

## 2015-07-25 ENCOUNTER — Inpatient Hospital Stay (HOSPITAL_COMMUNITY): Payer: Medicare Other

## 2015-07-25 ENCOUNTER — Encounter (HOSPITAL_COMMUNITY): Payer: Self-pay | Admitting: Emergency Medicine

## 2015-07-25 DIAGNOSIS — I639 Cerebral infarction, unspecified: Secondary | ICD-10-CM | POA: Diagnosis not present

## 2015-07-25 DIAGNOSIS — Z7984 Long term (current) use of oral hypoglycemic drugs: Secondary | ICD-10-CM | POA: Diagnosis not present

## 2015-07-25 DIAGNOSIS — Z823 Family history of stroke: Secondary | ICD-10-CM

## 2015-07-25 DIAGNOSIS — Z7982 Long term (current) use of aspirin: Secondary | ICD-10-CM | POA: Diagnosis not present

## 2015-07-25 DIAGNOSIS — E7800 Pure hypercholesterolemia, unspecified: Secondary | ICD-10-CM | POA: Diagnosis not present

## 2015-07-25 DIAGNOSIS — F319 Bipolar disorder, unspecified: Secondary | ICD-10-CM | POA: Diagnosis not present

## 2015-07-25 DIAGNOSIS — N183 Chronic kidney disease, stage 3 unspecified: Secondary | ICD-10-CM | POA: Diagnosis present

## 2015-07-25 DIAGNOSIS — R29810 Facial weakness: Secondary | ICD-10-CM | POA: Diagnosis not present

## 2015-07-25 DIAGNOSIS — N400 Enlarged prostate without lower urinary tract symptoms: Secondary | ICD-10-CM | POA: Diagnosis not present

## 2015-07-25 DIAGNOSIS — IMO0002 Reserved for concepts with insufficient information to code with codable children: Secondary | ICD-10-CM

## 2015-07-25 DIAGNOSIS — I10 Essential (primary) hypertension: Secondary | ICD-10-CM | POA: Diagnosis present

## 2015-07-25 DIAGNOSIS — I6389 Other cerebral infarction: Secondary | ICD-10-CM

## 2015-07-25 DIAGNOSIS — Z8673 Personal history of transient ischemic attack (TIA), and cerebral infarction without residual deficits: Secondary | ICD-10-CM | POA: Diagnosis not present

## 2015-07-25 DIAGNOSIS — G458 Other transient cerebral ischemic attacks and related syndromes: Secondary | ICD-10-CM | POA: Diagnosis not present

## 2015-07-25 DIAGNOSIS — E118 Type 2 diabetes mellitus with unspecified complications: Secondary | ICD-10-CM | POA: Diagnosis not present

## 2015-07-25 DIAGNOSIS — I69392 Facial weakness following cerebral infarction: Secondary | ICD-10-CM

## 2015-07-25 DIAGNOSIS — Z800 Family history of malignant neoplasm of digestive organs: Secondary | ICD-10-CM | POA: Diagnosis not present

## 2015-07-25 DIAGNOSIS — I6359 Cerebral infarction due to unspecified occlusion or stenosis of other cerebral artery: Secondary | ICD-10-CM | POA: Diagnosis not present

## 2015-07-25 DIAGNOSIS — K219 Gastro-esophageal reflux disease without esophagitis: Secondary | ICD-10-CM | POA: Diagnosis not present

## 2015-07-25 LAB — URINALYSIS, ROUTINE W REFLEX MICROSCOPIC
Glucose, UA: NEGATIVE mg/dL
HGB URINE DIPSTICK: NEGATIVE
Ketones, ur: NEGATIVE mg/dL
Leukocytes, UA: NEGATIVE
Nitrite: NEGATIVE
Protein, ur: NEGATIVE mg/dL
SPECIFIC GRAVITY, URINE: 1.03 (ref 1.005–1.030)
pH: 5 (ref 5.0–8.0)

## 2015-07-25 LAB — COMPREHENSIVE METABOLIC PANEL
ALBUMIN: 4 g/dL (ref 3.5–5.0)
ALT: 32 U/L (ref 17–63)
ANION GAP: 11 (ref 5–15)
AST: 29 U/L (ref 15–41)
Alkaline Phosphatase: 93 U/L (ref 38–126)
BUN: 24 mg/dL — ABNORMAL HIGH (ref 6–20)
CHLORIDE: 106 mmol/L (ref 101–111)
CO2: 22 mmol/L (ref 22–32)
Calcium: 8.8 mg/dL — ABNORMAL LOW (ref 8.9–10.3)
Creatinine, Ser: 1.17 mg/dL (ref 0.61–1.24)
GFR calc Af Amer: >60 mL/min (ref >60)
GFR calc non Af Amer: >60 mL/min (ref >60)
GLUCOSE: 173 mg/dL — AB (ref 65–99)
POTASSIUM: 4.5 mmol/L (ref 3.5–5.1)
SODIUM: 139 mmol/L (ref 135–145)
TOTAL PROTEIN: 6.6 g/dL (ref 6.5–8.1)
Total Bilirubin: 0.5 mg/dL (ref 0.3–1.2)

## 2015-07-25 LAB — APTT: aPTT: 25 seconds (ref 24–37)

## 2015-07-25 LAB — CBC
HCT: 37.9 % — ABNORMAL LOW (ref 39.0–52.0)
HEMOGLOBIN: 13 g/dL (ref 13.0–17.0)
MCH: 30.8 pg (ref 26.0–34.0)
MCHC: 34.3 g/dL (ref 30.0–36.0)
MCV: 89.8 fL (ref 78.0–100.0)
PLATELETS: 125 10*3/uL — AB (ref 150–400)
RBC: 4.22 MIL/uL (ref 4.22–5.81)
RDW: 13.9 % (ref 11.5–15.5)
WBC: 5.9 10*3/uL (ref 4.0–10.5)

## 2015-07-25 LAB — GLUCOSE, CAPILLARY
Glucose-Capillary: 121 mg/dL — ABNORMAL HIGH (ref 65–99)
Glucose-Capillary: 124 mg/dL — ABNORMAL HIGH (ref 65–99)

## 2015-07-25 LAB — TROPONIN I

## 2015-07-25 LAB — RAPID URINE DRUG SCREEN, HOSP PERFORMED
AMPHETAMINES: NOT DETECTED
BARBITURATES: NOT DETECTED
BENZODIAZEPINES: POSITIVE — AB
Cocaine: NOT DETECTED
Opiates: NOT DETECTED
TETRAHYDROCANNABINOL: NOT DETECTED

## 2015-07-25 LAB — DIFFERENTIAL
BASOS PCT: 0 %
Basophils Absolute: 0 10*3/uL (ref 0.0–0.1)
EOS ABS: 0.3 10*3/uL (ref 0.0–0.7)
EOS PCT: 5 %
Lymphocytes Relative: 20 %
Lymphs Abs: 1.2 10*3/uL (ref 0.7–4.0)
Monocytes Absolute: 0.5 10*3/uL (ref 0.1–1.0)
Monocytes Relative: 9 %
NEUTROS PCT: 66 %
Neutro Abs: 3.9 10*3/uL (ref 1.7–7.7)

## 2015-07-25 LAB — PROTIME-INR
INR: 1.11 (ref 0.00–1.49)
PROTHROMBIN TIME: 14.5 s (ref 11.6–15.2)

## 2015-07-25 LAB — ETHANOL: Alcohol, Ethyl (B): <5 mg/dL (ref <5)

## 2015-07-25 LAB — CBG MONITORING, ED: Glucose-Capillary: 158 mg/dL — ABNORMAL HIGH (ref 65–99)

## 2015-07-25 MED ORDER — IOHEXOL 350 MG/ML SOLN
75.0000 mL | Freq: Once | INTRAVENOUS | Status: AC | PRN
  Administered 2015-07-25: 75 mL via INTRAVENOUS

## 2015-07-25 MED ORDER — FLUOXETINE HCL 20 MG PO CAPS
20.0000 mg | ORAL_CAPSULE | Freq: Every day | ORAL | Status: DC
  Administered 2015-07-26 – 2015-07-27 (×2): 20 mg via ORAL
  Filled 2015-07-25 (×2): qty 1

## 2015-07-25 MED ORDER — GLIPIZIDE 5 MG PO TABS
10.0000 mg | ORAL_TABLET | Freq: Every day | ORAL | Status: DC
  Administered 2015-07-26 – 2015-07-27 (×2): 10 mg via ORAL
  Filled 2015-07-25 (×2): qty 2

## 2015-07-25 MED ORDER — SENNOSIDES-DOCUSATE SODIUM 8.6-50 MG PO TABS: 1.0000 | ORAL_TABLET | Freq: Every evening | ORAL | Status: DC | PRN

## 2015-07-25 MED ORDER — LORAZEPAM 2 MG/ML IJ SOLN
1.0000 mg | Freq: Once | INTRAMUSCULAR | Status: AC
  Administered 2015-07-25: 1 mg via INTRAVENOUS

## 2015-07-25 MED ORDER — TAMSULOSIN HCL 0.4 MG PO CAPS
0.4000 mg | ORAL_CAPSULE | Freq: Two times a day (BID) | ORAL | Status: DC
  Administered 2015-07-25 – 2015-07-27 (×4): 0.4 mg via ORAL
  Filled 2015-07-25 (×4): qty 1

## 2015-07-25 MED ORDER — STROKE: EARLY STAGES OF RECOVERY BOOK
Freq: Once | Status: DC
  Filled 2015-07-25: qty 1

## 2015-07-25 MED ORDER — LORAZEPAM 1 MG PO TABS
1.0000 mg | ORAL_TABLET | Freq: Three times a day (TID) | ORAL | Status: DC
  Administered 2015-07-25 – 2015-07-26 (×4): 1 mg via ORAL
  Filled 2015-07-25 (×4): qty 1

## 2015-07-25 MED ORDER — IOHEXOL 350 MG/ML SOLN: 100.0000 mL | Freq: Once | INTRAVENOUS | Status: DC | PRN

## 2015-07-25 MED ORDER — PANTOPRAZOLE SODIUM 40 MG PO TBEC
40.0000 mg | DELAYED_RELEASE_TABLET | Freq: Every morning | ORAL | Status: DC
Start: 2015-07-26 — End: 2015-07-27
  Administered 2015-07-26 – 2015-07-27 (×2): 40 mg via ORAL
  Filled 2015-07-25 (×2): qty 1

## 2015-07-25 MED ORDER — VITAMIN D 1000 UNITS PO TABS
2000.0000 [IU] | ORAL_TABLET | Freq: Every day | ORAL | Status: DC
  Administered 2015-07-26 – 2015-07-27 (×2): 2000 [IU] via ORAL
  Filled 2015-07-25 (×2): qty 2

## 2015-07-25 MED ORDER — SODIUM CHLORIDE 0.9 % IV SOLN
INTRAVENOUS | Status: DC
  Administered 2015-07-25: 22:00:00 via INTRAVENOUS

## 2015-07-25 MED ORDER — ZOLPIDEM TARTRATE 5 MG PO TABS
10.0000 mg | ORAL_TABLET | Freq: Every day | ORAL | Status: DC
  Administered 2015-07-25 – 2015-07-26 (×2): 10 mg via ORAL
  Filled 2015-07-25 (×2): qty 2

## 2015-07-25 MED ORDER — ASPIRIN 300 MG RE SUPP: 300.0000 mg | Freq: Every day | RECTAL | Status: DC

## 2015-07-25 MED ORDER — ASPIRIN 325 MG PO TABS
325.0000 mg | ORAL_TABLET | Freq: Every day | ORAL | Status: DC
  Administered 2015-07-26 – 2015-07-27 (×2): 325 mg via ORAL
  Filled 2015-07-25 (×2): qty 1

## 2015-07-25 MED ORDER — ACETAMINOPHEN 325 MG PO TABS
650.0000 mg | ORAL_TABLET | ORAL | Status: DC | PRN
  Administered 2015-07-26 – 2015-07-27 (×2): 650 mg via ORAL
  Filled 2015-07-25 (×2): qty 2

## 2015-07-25 MED ORDER — ATENOLOL 25 MG PO TABS
100.0000 mg | ORAL_TABLET | Freq: Two times a day (BID) | ORAL | Status: DC
  Administered 2015-07-25 – 2015-07-27 (×4): 100 mg via ORAL
  Filled 2015-07-25 (×4): qty 4

## 2015-07-25 MED ORDER — INSULIN ASPART 100 UNIT/ML ~~LOC~~ SOLN
0.0000 [IU] | Freq: Three times a day (TID) | SUBCUTANEOUS | Status: DC
  Administered 2015-07-26: 2 [IU] via SUBCUTANEOUS
  Administered 2015-07-26: 3 [IU] via SUBCUTANEOUS
  Administered 2015-07-26 – 2015-07-27 (×3): 2 [IU] via SUBCUTANEOUS

## 2015-07-25 MED ORDER — LORAZEPAM 2 MG/ML IJ SOLN
INTRAMUSCULAR | Status: AC
  Filled 2015-07-25: qty 1

## 2015-07-25 MED ORDER — ENOXAPARIN SODIUM 40 MG/0.4ML ~~LOC~~ SOLN
40.0000 mg | SUBCUTANEOUS | Status: DC
  Administered 2015-07-25 – 2015-07-26 (×2): 40 mg via SUBCUTANEOUS
  Filled 2015-07-25 (×2): qty 0.4

## 2015-07-25 MED ORDER — METOCLOPRAMIDE HCL 10 MG PO TABS
5.0000 mg | ORAL_TABLET | Freq: Every morning | ORAL | Status: DC
  Administered 2015-07-26 – 2015-07-27 (×2): 5 mg via ORAL
  Filled 2015-07-25 (×2): qty 1

## 2015-07-25 MED ORDER — LOSARTAN POTASSIUM 50 MG PO TABS
100.0000 mg | ORAL_TABLET | Freq: Every day | ORAL | Status: DC
  Administered 2015-07-26 – 2015-07-27 (×2): 100 mg via ORAL
  Filled 2015-07-25 (×2): qty 2

## 2015-07-25 MED ORDER — INSULIN ASPART 100 UNIT/ML ~~LOC~~ SOLN: 0.0000 [IU] | Freq: Every day | SUBCUTANEOUS | Status: DC

## 2015-07-25 MED ORDER — CARBAMAZEPINE 200 MG PO TABS
200.0000 mg | ORAL_TABLET | Freq: Two times a day (BID) | ORAL | Status: DC
  Administered 2015-07-25 – 2015-07-27 (×4): 200 mg via ORAL
  Filled 2015-07-25 (×4): qty 1

## 2015-07-25 MED ORDER — LAMOTRIGINE 100 MG PO TABS
400.0000 mg | ORAL_TABLET | Freq: Every day | ORAL | Status: DC
  Administered 2015-07-25 – 2015-07-26 (×2): 400 mg via ORAL
  Filled 2015-07-25 (×2): qty 4

## 2015-07-25 MED ORDER — ACETAMINOPHEN 650 MG RE SUPP: 650.0000 mg | RECTAL | Status: DC | PRN

## 2015-07-25 NOTE — ED Notes (Signed)
PT stated he went to bed around midnight last night with no complaints and woke up this morning around 0900 and noticed right sided facial droop and numbness but no weakness noted. PT drove self to ED for eval. CBG 158.

## 2015-07-25 NOTE — H&P (Signed)
[facility] History and Physical  Robert Walters [id number] DOB: 1956-02-13 DOA: 07/25/2015  Referring physician: Dr. Venora Maples, ER PCP: Maximino Greenland, MD   Chief Complaint: facial droop  HPI: Robert Walters is a 60 y.o. male with history of HTN, DM and HLD who was in his usual state of health last night when he went to bed. He woke up this morning with complaints of right sided facial droop and subjective slurring of speech. He denied any difficulty swallowing or changes in vision. He felt generally weak, but did not endorse any unilateral weakness or numbness. He denies any numbness or paresthesias.  He has not had any fever, cough, shortness of breath, nausea, vomiting or diarrhea. He was evaluated in the ED and was found to have acute stroke on MRI. He will be admitted for further management.   Review of Systems:  Pertinent positives as per HPI, otherwise negative   Past Medical History  Diagnosis Date  . Hypertension   . Hyperlipidemia   . Obesity   . Acid reflux disease   . Gastritis   . BPH (benign prostatic hypertrophy)   . Vertigo   . Normal nuclear stress test Dec 2011  . Type II diabetes mellitus ([place])   . H/O hiatal hernia   . TIA (transient ischemic attack) 05/15/2012    "they think I've had one this am @ 0130" (05/15/2012)   Past Surgical History  Procedure Laterality Date  . Cardiovascular stress test  2009    EF 63%  . No past surgeries     Social History:  reports that he has never smoked. He has never used smokeless tobacco. He reports that he does not drink alcohol or use illicit drugs.  No Known Allergies  Family History  Problem Relation Age of Onset  . Hyperlipidemia Father   . Colon cancer Father   . Stroke Mother     Prior to Admission medications   Medication Sig Start Date End Date Taking? Authorizing Provider  aspirin 81 MG tablet Take 1 tablet (81 mg total) by mouth daily. 06/11/13  Yes Thayer Headings, MD  atenolol (TENORMIN) 100 MG  tablet Take 100 mg by mouth 2 (two) times daily.     Yes Historical Provider, MD  carbamazepine (EPITOL) 200 MG tablet Take 200 mg by mouth 2 (two) times daily.   Yes Historical Provider, MD  Cholecalciferol (VITAMIN D) 2000 UNITS CAPS Take 1 capsule by mouth daily.   Yes Historical Provider, MD  FLUoxetine (PROZAC) 20 MG capsule Take 20 mg by mouth daily.   Yes Historical Provider, MD  glipiZIDE (GLUCOTROL) 10 MG tablet Take 10 mg by mouth daily.    Yes Historical Provider, MD  lamoTRIgine (LAMICTAL) 200 MG tablet Take 400 mg by mouth at bedtime.    Yes Historical Provider, MD  LORazepam (ATIVAN) 1 MG tablet Take 1 mg by mouth every 8 (eight) hours.   Yes Historical Provider, MD  losartan (COZAAR) 100 MG tablet Take 1 tablet (100 mg total) by mouth daily. 07/22/15  Yes Thayer Headings, MD  metFORMIN (GLUCOPHAGE) 1000 MG tablet Take 1,000 mg by mouth 2 (two) times daily with a meal.     Yes Historical Provider, MD  metoCLOPramide (REGLAN) 5 MG tablet Take 5 mg by mouth every morning.    Yes Historical Provider, MD  pantoprazole (PROTONIX) 40 MG tablet Take 40 mg by mouth every morning.    Yes Historical Provider, MD  Tamsulosin HCl (FLOMAX) 0.4  MG CAPS Take 0.4 mg by mouth 2 (two) times daily.   Yes Historical Provider, MD  zolpidem (AMBIEN) 10 MG tablet Take 1 tablet (10 mg total) by mouth at bedtime as needed for sleep. Patient taking differently: Take 10 mg by mouth at bedtime.  08/15/12  Yes Leonard Schwartz, MD   Physical Exam: Filed Vitals:   07/25/15 1430 07/25/15 1445 07/25/15 1500 07/25/15 1600  BP: 132/87  130/77 151/92  Pulse:    67  Temp:    97.7 F (36.5 C)  TempSrc:    Oral  Resp: 14 18 14 20   Height:    5\' 10"  (1.778 m)  Weight:    104.237 kg (229 lb 12.8 oz)  SpO2:    99%    Wt Readings from Last 3 Encounters:  07/25/15 104.237 kg (229 lb 12.8 oz)  07/25/15 104.327 kg (230 lb)  08/09/14 107.956 kg (238 lb)    General:  Appears calm and comfortable Eyes: PERRL, normal  lids, irises & conjunctiva ENT: grossly normal hearing, lips & tongue Neck: no LAD, masses or thyromegaly Cardiovascular: RRR, no m/r/g. No LE edema. Telemetry: SR, no arrhythmias  Respiratory: CTA bilaterally, no w/r/r. Normal respiratory effort. Abdomen: soft, ntnd Skin: no rash or induration seen on limited exam Musculoskeletal: grossly normal tone BUE/BLE Psychiatric: grossly normal mood and affect, speech fluent and appropriate Neurologic: right sided facial droop present, EOMI, strength is 5/5 in extremities bilaterally          Labs on Admission:  Basic Metabolic Panel:  Recent Labs Lab 07/25/15 1107  NA 139  K 4.5  CL 106  CO2 22  GLUCOSE 173*  BUN 24*  CREATININE 1.17  CALCIUM 8.8*   Liver Function Tests:  Recent Labs Lab 07/25/15 1107  AST 29  ALT 32  ALKPHOS 93  BILITOT 0.5  PROT 6.6  ALBUMIN 4.0   No results for input(s): LIPASE, AMYLASE in the last 168 hours. No results for input(s): AMMONIA in the last 168 hours. CBC:  Recent Labs Lab 07/25/15 1107  WBC 5.9  NEUTROABS 3.9  HGB 13.0  HCT 37.9*  MCV 89.8  PLT 125*   Cardiac Enzymes:  Recent Labs Lab 07/25/15 1107  TROPONINI <0.03    BNP (last 3 results) No results for input(s): BNP in the last 8760 hours.  ProBNP (last 3 results) No results for input(s): PROBNP in the last 8760 hours.  CBG:  Recent Labs Lab 07/25/15 1054 07/25/15 1617  GLUCAP 158* 121*    Radiological Exams on Admission: Ct Angio Head W/cm &/or Wo Cm  07/25/2015  CLINICAL DATA:  Acute right brain stroke. EXAM: CT ANGIOGRAPHY HEAD AND NECK TECHNIQUE: Multidetector CT imaging of the head and neck was performed using the standard protocol during bolus administration of intravenous contrast. Multiplanar CT image reconstructions and MIPs were obtained to evaluate the vascular anatomy. Carotid stenosis measurements (when applicable) are obtained utilizing NASCET criteria, using the distal internal carotid diameter  as the denominator. CONTRAST:  59mL OMNIPAQUE IOHEXOL 350 MG/ML SOLN COMPARISON:  MRI 07/25/2015 FINDINGS: CT HEAD Brain: Acute infarct in the right parietal white matter seen on MRI is difficult to see on CT. Chronic infarcts are present in the corona radiata on the right and in the right anterior limb internal capsule. Chronic infarct left caudate head. Negative for hemorrhage or mass. Calvarium and skull base: Negative Paranasal sinuses: Negative Orbits: Negative CTA NECK Aortic arch: Normal aortic arch. Proximal great vessels widely patent. Right carotid  system: Right carotid widely patent. There is mild atherosclerotic disease at the carotid bifurcation without significant stenosis. Left carotid system: Mild atherosclerotic disease of the carotid bifurcation. No significant carotid stenosis. Vertebral arteries:Both vertebral arteries patent to the basilar. Calcific stenosis distal right vertebral artery. Skeleton: Mild facet degeneration in the cervical spine. No acute bony abnormality. Other neck: Negative for mass or adenopathy in the neck. CTA HEAD Anterior circulation: Mild atherosclerotic calcification in the cavernous carotid bilaterally without significant stenosis. Anterior and middle cerebral arteries patent bilaterally without stenosis or aneurysm. Posterior circulation: Mild calcific stenosis distal right vertebral artery. Left vertebral artery widely patent. PICA patent bilaterally. Basilar widely patent. AICA, superior cerebellar, and posterior cerebral arteries widely patent bilaterally. Negative for cerebral aneurysm Venous sinuses: Left transverse sinus widely patent. Sagittal sinus widely patent. Hypoplastic right transverse sinus the. Anatomic variants: No aneurysm Delayed phase: Normal enhancement on delayed imaging. No abnormal enhancement IMPRESSION: Acute infarct right parietal white matter best seen on MRI. Mild atherosclerotic disease in the carotid bifurcation bilaterally. No  significant carotid or vertebral stenosis in the neck. Mild stenosis distal right vertebral artery Mild atherosclerotic disease in the cavernous carotid. No significant intracranial stenosis. Electronically Signed   By: Franchot Gallo M.D.   On: 07/25/2015 14:35   Ct Angio Neck W/cm &/or Wo/cm  07/25/2015  CLINICAL DATA:  Acute right brain stroke. EXAM: CT ANGIOGRAPHY HEAD AND NECK TECHNIQUE: Multidetector CT imaging of the head and neck was performed using the standard protocol during bolus administration of intravenous contrast. Multiplanar CT image reconstructions and MIPs were obtained to evaluate the vascular anatomy. Carotid stenosis measurements (when applicable) are obtained utilizing NASCET criteria, using the distal internal carotid diameter as the denominator. CONTRAST:  82mL OMNIPAQUE IOHEXOL 350 MG/ML SOLN COMPARISON:  MRI 07/25/2015 FINDINGS: CT HEAD Brain: Acute infarct in the right parietal white matter seen on MRI is difficult to see on CT. Chronic infarcts are present in the corona radiata on the right and in the right anterior limb internal capsule. Chronic infarct left caudate head. Negative for hemorrhage or mass. Calvarium and skull base: Negative Paranasal sinuses: Negative Orbits: Negative CTA NECK Aortic arch: Normal aortic arch. Proximal great vessels widely patent. Right carotid system: Right carotid widely patent. There is mild atherosclerotic disease at the carotid bifurcation without significant stenosis. Left carotid system: Mild atherosclerotic disease of the carotid bifurcation. No significant carotid stenosis. Vertebral arteries:Both vertebral arteries patent to the basilar. Calcific stenosis distal right vertebral artery. Skeleton: Mild facet degeneration in the cervical spine. No acute bony abnormality. Other neck: Negative for mass or adenopathy in the neck. CTA HEAD Anterior circulation: Mild atherosclerotic calcification in the cavernous carotid bilaterally without  significant stenosis. Anterior and middle cerebral arteries patent bilaterally without stenosis or aneurysm. Posterior circulation: Mild calcific stenosis distal right vertebral artery. Left vertebral artery widely patent. PICA patent bilaterally. Basilar widely patent. AICA, superior cerebellar, and posterior cerebral arteries widely patent bilaterally. Negative for cerebral aneurysm Venous sinuses: Left transverse sinus widely patent. Sagittal sinus widely patent. Hypoplastic right transverse sinus the. Anatomic variants: No aneurysm Delayed phase: Normal enhancement on delayed imaging. No abnormal enhancement IMPRESSION: Acute infarct right parietal white matter best seen on MRI. Mild atherosclerotic disease in the carotid bifurcation bilaterally. No significant carotid or vertebral stenosis in the neck. Mild stenosis distal right vertebral artery Mild atherosclerotic disease in the cavernous carotid. No significant intracranial stenosis. Electronically Signed   By: Franchot Gallo M.D.   On: 07/25/2015 14:35   Mr  Brain Wo Contrast  07/25/2015  CLINICAL DATA:  Right-sided numbness in weakness with right-sided facial droop and slurred speech. Acute presentation. EXAM: MRI HEAD WITHOUT CONTRAST TECHNIQUE: Multiplanar, multiecho pulse sequences of the brain and surrounding structures were obtained without intravenous contrast. COMPARISON:  08/10/2014 FINDINGS: Diffusion imaging shows an 8 mm acute infarction within the subcortical white matter of the right parietal lobe. No other acute infarction. There are extensive chronic small vessel ischemic changes throughout the pons. No focal cerebellar insult. Old small vessel ischemic changes are present throughout the thalami I, affecting the basal ganglia in the hemispheric white matter. No large vessel territory infarction. No mass lesion, hemorrhage, hydrocephalus or extra-axial collection. No pituitary mass. No inflammatory sinus disease. No skull or skullbase  lesion. Major vessels at the base of the brain show flow. IMPRESSION: 8 mm acute infarction in the right parietal white matter. No swelling or hemorrhage. Extensive chronic small vessel ischemic changes elsewhere throughout the brain as outlined above. Electronically Signed   By: Nelson Chimes M.D.   On: 07/25/2015 12:37    EKG: Independently reviewed. Poor quality EKG, no obvious acute changes  Assessment/Plan Principal Problem:   Stroke [facility]) Active Problems:   Pure hypercholesterolemia   HTN (hypertension)   Diabetes mellitus type 2 with complications ([facility])   BPH (benign prostatic hyperplasia)   GERD (gastroesophageal reflux disease)   Bipolar 1 disorder ([facility])   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   1. Acute Stroke. MRI brain shows acute 95mm infarction in the right parietal white matter. CTA of head and neck did not show any significant findings in circulation. He reports taking asa 81mg  daily. Will increase asa to 325mg . Will check echocardiogram. Check A1c and lipid panel. Neurology consult. 2. Diabetes. Hold metformin since patient received IV dye. Will start on SSI and continue glipizide 3. Bipolar 1 disorder. Will continue outpatient regimen 4. HTN. Continue outpatient regimen 5. BPH. Continue flomax 6. HLD. Continue statin. Check lipids 7. GERD. Continue PPI.   Code Status: full code DVT Prophylaxis: lovenox Family Communication: discussed with patient Disposition Plan: discharge home once improved  Time spent: 10mins  MEMON,JEHANZEB [facility] Pager [phone]

## 2015-07-25 NOTE — ED Notes (Signed)
MD at bedside. 

## 2015-07-25 NOTE — ED Notes (Signed)
When patient sticks out his tongue it deviates the left side. Subjective slurred speech which patient agrees with stating "I feel like Im slurring my words, I normally don't). Patient also c/o of swelling or a "fat lip" feeling on the left side of his check.

## 2015-07-25 NOTE — ED Notes (Addendum)
Patient to be kept NPO per Dr. Venora Maples

## 2015-07-25 NOTE — ED Notes (Signed)
Report called to [facility] on [address], all questions answered.

## 2015-07-25 NOTE — ED Provider Notes (Signed)
CSN: [id number]     Arrival date & time 07/25/15  1047 History  By signing my name below, I, Terressa Koyanagi, attest that this documentation has been prepared under the direction and in the presence of Jola Schmidt, MD. Electronically Signed: Terressa Koyanagi, ED Scribe. 07/25/2015. 11:06 AM.   Chief Complaint  Patient presents with  . Facial Droop   The history is provided by the patient. No language interpreter was used.   PCP: No primary care provider on file. HPI Comments: Robert Walters is a 60 y.o. male, with PMHx noted below including HTN, possible TIA (2014), hyperlipidemia, DMTII, who presents to the Emergency Department complaining of slurred speech with associated tingling/numbness on left side of face, right sided facial droop, and right siided weakness onset this morning around 9am. Pt reports he was last at baseline when he went to sleep last night at 10pm. Pt denies headache.   Past Medical History  Diagnosis Date  . Hypertension   . Hyperlipidemia   . Obesity   . Acid reflux disease   . Gastritis   . BPH (benign prostatic hypertrophy)   . Vertigo   . Normal nuclear stress test Dec 2011  . Type II diabetes mellitus ([place])   . H/O hiatal hernia   . TIA (transient ischemic attack) 05/15/2012    "they think I've had one this am @ 0130" (05/15/2012)   Past Surgical History  Procedure Laterality Date  . Cardiovascular stress test  2009    EF 63%  . No past surgeries     Family History  Problem Relation Age of Onset  . Hyperlipidemia Father   . Colon cancer Father   . Stroke Mother    Social History  Substance Use Topics  . Smoking status: Never Smoker   . Smokeless tobacco: Never Used  . Alcohol Use: No    Review of Systems  A complete 10 system review of systems was obtained and all systems are negative except as noted in the HPI and PMH.   Allergies  Review of patient's allergies indicates no known allergies.  Home Medications   Prior to Admission  medications   Medication Sig Start Date End Date Taking? Authorizing Provider  ALPRAZolam Duanne Moron) 1 MG tablet Take 1 mg by mouth 3 (three) times daily as needed for anxiety.  05/22/14   Historical Provider, MD  aspirin 81 MG tablet Take 1 tablet (81 mg total) by mouth daily. 06/11/13   Thayer Headings, MD  atenolol (TENORMIN) 100 MG tablet Take 100 mg by mouth 2 (two) times daily.      Historical Provider, MD  carbamazepine (EPITOL) 200 MG tablet Take 200 mg by mouth 2 (two) times daily.    Historical Provider, MD  Cholecalciferol (VITAMIN D) 2000 UNITS CAPS Take 1 capsule by mouth daily.    Historical Provider, MD  FLUoxetine (PROZAC) 20 MG capsule Take 20 mg by mouth daily.    Historical Provider, MD  glipiZIDE (GLUCOTROL) 10 MG tablet Take 10 mg by mouth daily.     Historical Provider, MD  lamoTRIgine (LAMICTAL) 200 MG tablet Take 400 mg by mouth at bedtime.     Historical Provider, MD  losartan (COZAAR) 100 MG tablet Take 1 tablet (100 mg total) by mouth daily. 07/22/15   Thayer Headings, MD  metFORMIN (GLUCOPHAGE) 1000 MG tablet Take 1,000 mg by mouth 2 (two) times daily with a meal.      Historical Provider, MD  metoCLOPramide (REGLAN) 5  MG tablet Take 5 mg by mouth every morning.     Historical Provider, MD  pantoprazole (PROTONIX) 40 MG tablet Take 40 mg by mouth every morning.     Historical Provider, MD  rosuvastatin (CRESTOR) 40 MG tablet Take 40 mg by mouth daily.    Historical Provider, MD  Tamsulosin HCl (FLOMAX) 0.4 MG CAPS Take 0.4 mg by mouth 2 (two) times daily.    Historical Provider, MD  ZETIA 10 MG tablet Take 10 mg by mouth daily. 05/20/14   Historical Provider, MD  zolpidem (AMBIEN) 10 MG tablet Take 1 tablet (10 mg total) by mouth at bedtime as needed for sleep. Patient taking differently: Take 10 mg by mouth at bedtime.  08/15/12   Leonard Schwartz, MD   Triage Vitals: BP 162/96 mmHg  Pulse 80  Temp(Src) 97.7 F (36.5 C) (Oral)  Resp 18  Ht 5\' 9"  (1.753 m)  Wt 230 lb  (104.327 kg)  BMI 33.95 kg/m2  SpO2 99% Physical Exam  Constitutional: He is oriented to person, place, and time. He appears well-developed and well-nourished.  HENT:  Head: Normocephalic and atraumatic.  Eyes: EOM are normal.  Neck: Normal range of motion.  Cardiovascular: Normal rate, regular rhythm, normal heart sounds and intact distal pulses.   Pulmonary/Chest: Effort normal and breath sounds normal. No respiratory distress.  Abdominal: Soft. He exhibits no distension. There is no tenderness.  Musculoskeletal: Normal range of motion.  Neurological: He is alert and oriented to person, place, and time.  Mild right facial droop. Mild dysarthria. Mild drift RUE. 5-/5 weakness of RLE.   Skin: Skin is warm and dry.  Psychiatric: He has a normal mood and affect. Judgment normal.  Nursing note and vitals reviewed.   ED Course  Procedures (including critical care time) DIAGNOSTIC STUDIES: Oxygen Saturation is 99% on ra, nl by my interpretation.    COORDINATION OF CARE: 11:02 AM: Discussed treatment plan which includes labs, imaging, and EKG with pt at bedside; patient verbalizes understanding and agrees with treatment plan.  Labs Review Labs Reviewed  CBG MONITORING, ED - Abnormal; Notable for the following:    Glucose-Capillary 158 (*)    All other components within normal limits  ETHANOL  PROTIME-INR  APTT  CBC  DIFFERENTIAL  COMPREHENSIVE METABOLIC PANEL  URINE RAPID DRUG SCREEN, HOSP PERFORMED  URINALYSIS, ROUTINE W REFLEX MICROSCOPIC (NOT AT [facility])  TROPONIN I    Imaging Review Ct Angio Head W/cm &/or Wo Cm  07/25/2015  CLINICAL DATA:  Acute right brain stroke. EXAM: CT ANGIOGRAPHY HEAD AND NECK TECHNIQUE: Multidetector CT imaging of the head and neck was performed using the standard protocol during bolus administration of intravenous contrast. Multiplanar CT image reconstructions and MIPs were obtained to evaluate the vascular anatomy. Carotid stenosis measurements  (when applicable) are obtained utilizing NASCET criteria, using the distal internal carotid diameter as the denominator. CONTRAST:  24mL OMNIPAQUE IOHEXOL 350 MG/ML SOLN COMPARISON:  MRI 07/25/2015 FINDINGS: CT HEAD Brain: Acute infarct in the right parietal white matter seen on MRI is difficult to see on CT. Chronic infarcts are present in the corona radiata on the right and in the right anterior limb internal capsule. Chronic infarct left caudate head. Negative for hemorrhage or mass. Calvarium and skull base: Negative Paranasal sinuses: Negative Orbits: Negative CTA NECK Aortic arch: Normal aortic arch. Proximal great vessels widely patent. Right carotid system: Right carotid widely patent. There is mild atherosclerotic disease at the carotid bifurcation without significant stenosis. Left carotid system:  Mild atherosclerotic disease of the carotid bifurcation. No significant carotid stenosis. Vertebral arteries:Both vertebral arteries patent to the basilar. Calcific stenosis distal right vertebral artery. Skeleton: Mild facet degeneration in the cervical spine. No acute bony abnormality. Other neck: Negative for mass or adenopathy in the neck. CTA HEAD Anterior circulation: Mild atherosclerotic calcification in the cavernous carotid bilaterally without significant stenosis. Anterior and middle cerebral arteries patent bilaterally without stenosis or aneurysm. Posterior circulation: Mild calcific stenosis distal right vertebral artery. Left vertebral artery widely patent. PICA patent bilaterally. Basilar widely patent. AICA, superior cerebellar, and posterior cerebral arteries widely patent bilaterally. Negative for cerebral aneurysm Venous sinuses: Left transverse sinus widely patent. Sagittal sinus widely patent. Hypoplastic right transverse sinus the. Anatomic variants: No aneurysm Delayed phase: Normal enhancement on delayed imaging. No abnormal enhancement IMPRESSION: Acute infarct right parietal white matter  best seen on MRI. Mild atherosclerotic disease in the carotid bifurcation bilaterally. No significant carotid or vertebral stenosis in the neck. Mild stenosis distal right vertebral artery Mild atherosclerotic disease in the cavernous carotid. No significant intracranial stenosis. Electronically Signed   By: Franchot Gallo M.D.   On: 07/25/2015 14:35   Ct Angio Neck W/cm &/or Wo/cm  07/25/2015  CLINICAL DATA:  Acute right brain stroke. EXAM: CT ANGIOGRAPHY HEAD AND NECK TECHNIQUE: Multidetector CT imaging of the head and neck was performed using the standard protocol during bolus administration of intravenous contrast. Multiplanar CT image reconstructions and MIPs were obtained to evaluate the vascular anatomy. Carotid stenosis measurements (when applicable) are obtained utilizing NASCET criteria, using the distal internal carotid diameter as the denominator. CONTRAST:  30mL OMNIPAQUE IOHEXOL 350 MG/ML SOLN COMPARISON:  MRI 07/25/2015 FINDINGS: CT HEAD Brain: Acute infarct in the right parietal white matter seen on MRI is difficult to see on CT. Chronic infarcts are present in the corona radiata on the right and in the right anterior limb internal capsule. Chronic infarct left caudate head. Negative for hemorrhage or mass. Calvarium and skull base: Negative Paranasal sinuses: Negative Orbits: Negative CTA NECK Aortic arch: Normal aortic arch. Proximal great vessels widely patent. Right carotid system: Right carotid widely patent. There is mild atherosclerotic disease at the carotid bifurcation without significant stenosis. Left carotid system: Mild atherosclerotic disease of the carotid bifurcation. No significant carotid stenosis. Vertebral arteries:Both vertebral arteries patent to the basilar. Calcific stenosis distal right vertebral artery. Skeleton: Mild facet degeneration in the cervical spine. No acute bony abnormality. Other neck: Negative for mass or adenopathy in the neck. CTA HEAD Anterior circulation:  Mild atherosclerotic calcification in the cavernous carotid bilaterally without significant stenosis. Anterior and middle cerebral arteries patent bilaterally without stenosis or aneurysm. Posterior circulation: Mild calcific stenosis distal right vertebral artery. Left vertebral artery widely patent. PICA patent bilaterally. Basilar widely patent. AICA, superior cerebellar, and posterior cerebral arteries widely patent bilaterally. Negative for cerebral aneurysm Venous sinuses: Left transverse sinus widely patent. Sagittal sinus widely patent. Hypoplastic right transverse sinus the. Anatomic variants: No aneurysm Delayed phase: Normal enhancement on delayed imaging. No abnormal enhancement IMPRESSION: Acute infarct right parietal white matter best seen on MRI. Mild atherosclerotic disease in the carotid bifurcation bilaterally. No significant carotid or vertebral stenosis in the neck. Mild stenosis distal right vertebral artery Mild atherosclerotic disease in the cavernous carotid. No significant intracranial stenosis. Electronically Signed   By: Franchot Gallo M.D.   On: 07/25/2015 14:35   Mr Brain Wo Contrast  07/25/2015  CLINICAL DATA:  Right-sided numbness in weakness with right-sided facial droop and slurred speech.  Acute presentation. EXAM: MRI HEAD WITHOUT CONTRAST TECHNIQUE: Multiplanar, multiecho pulse sequences of the brain and surrounding structures were obtained without intravenous contrast. COMPARISON:  08/10/2014 FINDINGS: Diffusion imaging shows an 8 mm acute infarction within the subcortical white matter of the right parietal lobe. No other acute infarction. There are extensive chronic small vessel ischemic changes throughout the pons. No focal cerebellar insult. Old small vessel ischemic changes are present throughout the thalami I, affecting the basal ganglia in the hemispheric white matter. No large vessel territory infarction. No mass lesion, hemorrhage, hydrocephalus or extra-axial  collection. No pituitary mass. No inflammatory sinus disease. No skull or skullbase lesion. Major vessels at the base of the brain show flow. IMPRESSION: 8 mm acute infarction in the right parietal white matter. No swelling or hemorrhage. Extensive chronic small vessel ischemic changes elsewhere throughout the brain as outlined above. Electronically Signed   By: Nelson Chimes M.D.   On: 07/25/2015 12:37   I have personally reviewed and evaluated these images and lab results as part of my medical decision-making.   EKG Interpretation   Date/Time:  Friday July 25 2015 10:56:05 EDT Ventricular Rate:  78 PR Interval:  178 QRS Duration: 111 QT Interval:  383 QTC Calculation: 436 R Axis:   109 Text Interpretation:  Sinus rhythm Right axis deviation Borderline T  abnormalities, anterior leads Baseline wander in lead(s) II V1 V5 No old  tracing to compare Confirmed by Ruhama Lehew  MD, Lennette Bihari ([id number]) on 07/25/2015  11:03:47 AM      MDM   Final diagnoses:  Cerebrovascular accident (CVA) due to other mechanism [facility])    Patient be admitted the hospital for acute stroke noted on MRI.  Given his ongoing pain and discomfort in his neck that he had CT angioma head and neck will be obtained to evaluate for possibility of carotid or vertebral dissection.  [facility] admission  I personally performed the services described in this documentation, which was scribed in my presence. The recorded information has been reviewed and is accurate.       Jola Schmidt, MD 07/26/15 1740

## 2015-07-25 NOTE — ED Notes (Signed)
During swallow screen patient cough with cracker and water from thte straw. States he sometimes does this at home.

## 2015-07-26 ENCOUNTER — Other Ambulatory Visit (HOSPITAL_COMMUNITY): Payer: Medicare Other

## 2015-07-26 LAB — GLUCOSE, CAPILLARY
GLUCOSE-CAPILLARY: 126 mg/dL — AB (ref 65–99)
GLUCOSE-CAPILLARY: 136 mg/dL — AB (ref 65–99)
Glucose-Capillary: 157 mg/dL — ABNORMAL HIGH (ref 65–99)
Glucose-Capillary: 100 mg/dL — ABNORMAL HIGH (ref 65–99)

## 2015-07-26 LAB — LIPID PANEL
Cholesterol: 249 mg/dL — ABNORMAL HIGH (ref 0–200)
HDL: 35 mg/dL — ABNORMAL LOW (ref >40)
LDL Cholesterol: UNDETERMINED mg/dL (ref 0–99)
Total CHOL/HDL Ratio: 7.1 RATIO
Triglycerides: 817 mg/dL — ABNORMAL HIGH (ref <150)
VLDL: UNDETERMINED mg/dL (ref 0–40)

## 2015-07-26 MED ORDER — ATORVASTATIN CALCIUM 20 MG PO TABS
20.0000 mg | ORAL_TABLET | Freq: Every day | ORAL | Status: DC
  Administered 2015-07-26: 20 mg via ORAL
  Filled 2015-07-26: qty 1

## 2015-07-26 MED ORDER — ONDANSETRON HCL 4 MG/2ML IJ SOLN
4.0000 mg | Freq: Four times a day (QID) | INTRAMUSCULAR | Status: DC | PRN
  Filled 2015-07-26: qty 2

## 2015-07-26 NOTE — Progress Notes (Signed)
[facility] PROGRESS NOTE  JIHAD DUFFIE [id number] DOB: 1956-02-23 DOA: 07/25/2015 PCP: Maximino Greenland, MD  Assessment/Plan: 1. Acute Stroke. MRI brain shows acute 51mm infarction in the right parietal white matter. CTA of head and neck did not show any significant findings in circulation. He reports taking asa 81mg  daily. Continue increased asa dose of 325mg . Echocardiogram is pending. A1c is in process and lipid panel shows high triglycerides, could not calculate LDL. Will increase dose of statin. PT eval pending. 2. Diabetes. Metformin held since patient received IV dye. Continue SSI and glipizide 3. Bipolar 1 disorder. Will continue outpatient regimen 4. HTN. Continue outpatient regimen 5. BPH. Continue flomax 6. HLD. Will increase statin dose 7. GERD. Continue PPI.  Code Status: full DVT prophylaxis: Lovenox Family Communication: Discussed with patient Disposition Plan: Discharge home once improved   Consultants:    Procedures:  ECHO pending  Antibiotics:  none   HPI/Subjective: No dysphagia. Able to ambulate  Objective: Filed Vitals:   07/26/15 0559 07/26/15 0600  BP: 175/100 144/78  Pulse: 81   Temp: 98 F (36.7 C) 98 F (36.7 C)  Resp: 18 20   No intake or output data in the 24 hours ending 07/26/15 0710 Filed Weights   07/25/15 1058 07/25/15 1600  Weight: 104.327 kg (230 lb) 104.237 kg (229 lb 12.8 oz)    Exam:  General: NAD, looks comfortable Cardiovascular: RRR, S1, S2  Respiratory: clear bilaterally, No wheezing, rales or rhonchi Abdomen: soft, non tender, no distention , bowel sounds normal Musculoskeletal: No edema b/l   Data Reviewed: Basic Metabolic Panel:  Recent Labs Lab 07/25/15 1107  NA 139  K 4.5  CL 106  CO2 22  GLUCOSE 173*  BUN 24*  CREATININE 1.17  CALCIUM 8.8*   Liver Function Tests:  Recent Labs Lab 07/25/15 1107  AST 29  ALT 32  ALKPHOS 93  BILITOT 0.5  PROT 6.6  ALBUMIN 4.0   No results for  input(s): LIPASE, AMYLASE in the last 168 hours. No results for input(s): AMMONIA in the last 168 hours. CBC:  Recent Labs Lab 07/25/15 1107  WBC 5.9  NEUTROABS 3.9  HGB 13.0  HCT 37.9*  MCV 89.8  PLT 125*   Cardiac Enzymes:  Recent Labs Lab 07/25/15 1107  TROPONINI <0.03   BNP (last 3 results) No results for input(s): BNP in the last 8760 hours.  ProBNP (last 3 results) No results for input(s): PROBNP in the last 8760 hours.  CBG:  Recent Labs Lab 07/25/15 1054 07/25/15 1617 07/25/15 2134  GLUCAP 158* 121* 124*    No results found for this or any previous visit (from the past 240 hour(s)).   Studies: Ct Angio Head W/cm &/or Wo Cm  07/25/2015  CLINICAL DATA:  Acute right brain stroke. EXAM: CT ANGIOGRAPHY HEAD AND NECK TECHNIQUE: Multidetector CT imaging of the head and neck was performed using the standard protocol during bolus administration of intravenous contrast. Multiplanar CT image reconstructions and MIPs were obtained to evaluate the vascular anatomy. Carotid stenosis measurements (when applicable) are obtained utilizing NASCET criteria, using the distal internal carotid diameter as the denominator. CONTRAST:  69mL OMNIPAQUE IOHEXOL 350 MG/ML SOLN COMPARISON:  MRI 07/25/2015 FINDINGS: CT HEAD Brain: Acute infarct in the right parietal white matter seen on MRI is difficult to see on CT. Chronic infarcts are present in the corona radiata on the right and in the right anterior limb internal capsule. Chronic infarct left caudate head. Negative for hemorrhage or mass.  Calvarium and skull base: Negative Paranasal sinuses: Negative Orbits: Negative CTA NECK Aortic arch: Normal aortic arch. Proximal great vessels widely patent. Right carotid system: Right carotid widely patent. There is mild atherosclerotic disease at the carotid bifurcation without significant stenosis. Left carotid system: Mild atherosclerotic disease of the carotid bifurcation. No significant carotid  stenosis. Vertebral arteries:Both vertebral arteries patent to the basilar. Calcific stenosis distal right vertebral artery. Skeleton: Mild facet degeneration in the cervical spine. No acute bony abnormality. Other neck: Negative for mass or adenopathy in the neck. CTA HEAD Anterior circulation: Mild atherosclerotic calcification in the cavernous carotid bilaterally without significant stenosis. Anterior and middle cerebral arteries patent bilaterally without stenosis or aneurysm. Posterior circulation: Mild calcific stenosis distal right vertebral artery. Left vertebral artery widely patent. PICA patent bilaterally. Basilar widely patent. AICA, superior cerebellar, and posterior cerebral arteries widely patent bilaterally. Negative for cerebral aneurysm Venous sinuses: Left transverse sinus widely patent. Sagittal sinus widely patent. Hypoplastic right transverse sinus the. Anatomic variants: No aneurysm Delayed phase: Normal enhancement on delayed imaging. No abnormal enhancement IMPRESSION: Acute infarct right parietal white matter best seen on MRI. Mild atherosclerotic disease in the carotid bifurcation bilaterally. No significant carotid or vertebral stenosis in the neck. Mild stenosis distal right vertebral artery Mild atherosclerotic disease in the cavernous carotid. No significant intracranial stenosis. Electronically Signed   By: Franchot Gallo M.D.   On: 07/25/2015 14:35   Ct Angio Neck W/cm &/or Wo/cm  07/25/2015  CLINICAL DATA:  Acute right brain stroke. EXAM: CT ANGIOGRAPHY HEAD AND NECK TECHNIQUE: Multidetector CT imaging of the head and neck was performed using the standard protocol during bolus administration of intravenous contrast. Multiplanar CT image reconstructions and MIPs were obtained to evaluate the vascular anatomy. Carotid stenosis measurements (when applicable) are obtained utilizing NASCET criteria, using the distal internal carotid diameter as the denominator. CONTRAST:  52mL  OMNIPAQUE IOHEXOL 350 MG/ML SOLN COMPARISON:  MRI 07/25/2015 FINDINGS: CT HEAD Brain: Acute infarct in the right parietal white matter seen on MRI is difficult to see on CT. Chronic infarcts are present in the corona radiata on the right and in the right anterior limb internal capsule. Chronic infarct left caudate head. Negative for hemorrhage or mass. Calvarium and skull base: Negative Paranasal sinuses: Negative Orbits: Negative CTA NECK Aortic arch: Normal aortic arch. Proximal great vessels widely patent. Right carotid system: Right carotid widely patent. There is mild atherosclerotic disease at the carotid bifurcation without significant stenosis. Left carotid system: Mild atherosclerotic disease of the carotid bifurcation. No significant carotid stenosis. Vertebral arteries:Both vertebral arteries patent to the basilar. Calcific stenosis distal right vertebral artery. Skeleton: Mild facet degeneration in the cervical spine. No acute bony abnormality. Other neck: Negative for mass or adenopathy in the neck. CTA HEAD Anterior circulation: Mild atherosclerotic calcification in the cavernous carotid bilaterally without significant stenosis. Anterior and middle cerebral arteries patent bilaterally without stenosis or aneurysm. Posterior circulation: Mild calcific stenosis distal right vertebral artery. Left vertebral artery widely patent. PICA patent bilaterally. Basilar widely patent. AICA, superior cerebellar, and posterior cerebral arteries widely patent bilaterally. Negative for cerebral aneurysm Venous sinuses: Left transverse sinus widely patent. Sagittal sinus widely patent. Hypoplastic right transverse sinus the. Anatomic variants: No aneurysm Delayed phase: Normal enhancement on delayed imaging. No abnormal enhancement IMPRESSION: Acute infarct right parietal white matter best seen on MRI. Mild atherosclerotic disease in the carotid bifurcation bilaterally. No significant carotid or vertebral stenosis in  the neck. Mild stenosis distal right vertebral artery Mild atherosclerotic disease  in the cavernous carotid. No significant intracranial stenosis. Electronically Signed   By: Franchot Gallo M.D.   On: 07/25/2015 14:35   Mr Brain Wo Contrast  07/25/2015  CLINICAL DATA:  Right-sided numbness in weakness with right-sided facial droop and slurred speech. Acute presentation. EXAM: MRI HEAD WITHOUT CONTRAST TECHNIQUE: Multiplanar, multiecho pulse sequences of the brain and surrounding structures were obtained without intravenous contrast. COMPARISON:  08/10/2014 FINDINGS: Diffusion imaging shows an 8 mm acute infarction within the subcortical white matter of the right parietal lobe. No other acute infarction. There are extensive chronic small vessel ischemic changes throughout the pons. No focal cerebellar insult. Old small vessel ischemic changes are present throughout the thalami I, affecting the basal ganglia in the hemispheric white matter. No large vessel territory infarction. No mass lesion, hemorrhage, hydrocephalus or extra-axial collection. No pituitary mass. No inflammatory sinus disease. No skull or skullbase lesion. Major vessels at the base of the brain show flow. IMPRESSION: 8 mm acute infarction in the right parietal white matter. No swelling or hemorrhage. Extensive chronic small vessel ischemic changes elsewhere throughout the brain as outlined above. Electronically Signed   By: Nelson Chimes M.D.   On: 07/25/2015 12:37    Scheduled Meds: .  stroke: mapping our early stages of recovery book   Does not apply Once  . aspirin  300 mg Rectal Daily   Or  . aspirin  325 mg Oral Daily  . atenolol  100 mg Oral BID  . carbamazepine  200 mg Oral BID  . cholecalciferol  2,000 Units Oral Daily  . enoxaparin (LOVENOX) injection  40 mg Subcutaneous Q24H  . FLUoxetine  20 mg Oral Daily  . glipiZIDE  10 mg Oral QAC breakfast  . insulin aspart  0-15 Units Subcutaneous TID WC  . insulin aspart  0-5 Units  Subcutaneous QHS  . lamoTRIgine  400 mg Oral QHS  . LORazepam  1 mg Oral 3 times per day  . losartan  100 mg Oral Daily  . metoCLOPramide  5 mg Oral q morning - 10a  . pantoprazole  40 mg Oral q morning - 10a  . tamsulosin  0.4 mg Oral BID  . zolpidem  10 mg Oral QHS   Continuous Infusions: . sodium chloride 75 mL/hr at 07/25/15 2152    Principal Problem:   Stroke [facility]) Active Problems:   Pure hypercholesterolemia   HTN (hypertension)   Diabetes mellitus type 2 with complications (HCC)   BPH (benign prostatic hyperplasia)   GERD (gastroesophageal reflux disease)   Bipolar 1 disorder (H[facility])   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    Time spent: 25 minutes    Kathie Dike, MD. [facility] Pager [phone]. If 7PM-7AM, please contact night-coverage at [email or web address], password [facility] 07/26/2015, 7:10 AM  LOS: 1 day

## 2015-07-26 NOTE — Evaluation (Signed)
Physical Therapy Evaluation Patient Details Name: Robert Walters MRN: [id number] DOB: Jul 13, 1955 Today's Date: 07/26/2015   History of Present Illness  "Robert Walters" Singerman is a 60yo white who comes to [facility] after noticing some facial droop and difficulty wiith his speech. Imaging confirms acute lesion on R central white matter. PMH: HTN, HLD, GERD, Gastritis, BPH, Vertigo, IDDM, TIA. Pt reports that he is not always very compliant with his medications. Pt previously an Agricultural engineer, reports his leisure activity level has been decreased for 3 years since his mother passed, agreeable to some depression having being medicated for this by his PCP, but he reports still lacking motivation to be active.   Clinical Impression  Pt demonstrating mild to moderate dysarthria, moderate facial droop, gross motor coordination on the LUE and LLE, fine motor coordination on LUE, moderate balance impairment (Berg 30/56), mild drowsiness, mildly decreased safety awareness, and mild short term memory deficits, all limiting ability to perform ADL, IADL, basic mobility, and community mobility at [facility], as well as increasing risk of falls. Pt will benefit from skilled PT intervention to address these impairments and limitations, to restore to PLOF and improve pt safety.   Pt has limited social support, with no clear indicators who is available to assist when asked, and needs in-depth cognitive screening prior to return to driving. Recommending Home Health PT at [place] until safe/available community transportation is available, at which point, a transition to outpatient PT will be appropriate.     Follow Up Recommendations Home health PT;Supervision - Intermittent    Equipment Recommendations   (To be determined. )    Recommendations for Other Services Speech consult;OT consult     Precautions / Restrictions Precautions Precautions: None Precaution Comments: Very poor balance, see BBT score.       Mobility  Bed Mobility Overal bed mobility: Modified Independent             General bed mobility comments: Addditional time, mildly ataxic. altered posturing asymmetrical.   Transfers Overall transfer level: Needs assistance Equipment used: None Transfers: Sit to/from Stand Sit to Stand: Min guard         General transfer comment: Multiple attempts, LOB backwards until successful.   Ambulation/Gait Ambulation/Gait assistance: Min guard Ambulation Distance (Feet): 500 Feet Assistive device: None   Gait velocity: 0.47m/s Gait velocity interpretation: <1.8 ft/sec, indicative of risk for recurrent falls General Gait Details: steplength minimally step through, near equal, with asymmetrical stance time, swing speed. Reports to improve over time, speeds up, and speech is also less dysarthric after gait trial.   Stairs            Wheelchair Mobility    Modified Rankin (Stroke Patients Only)       Balance Overall balance assessment: Needs assistance                               Standardized Balance Assessment Standardized Balance Assessment : Berg Balance Test Berg Balance Test Sit to Stand: Able to stand using hands after several tries Standing Unsupported: Able to stand 2 minutes with supervision Sitting with Back Unsupported but Feet Supported on Floor or Stool: Able to sit 2 minutes under supervision Stand to Sit: Controls descent by using hands Transfers: Able to transfer safely, definite need of hands Standing Unsupported with Eyes Closed: Able to stand 10 seconds safely Standing Ubsupported with Feet Together: Able to place feet together independently and stand for 1  minute with supervision From Standing, Reach Forward with Outstretched Arm: Can reach confidently >25 cm (10") From Standing Position, Pick up Object from Floor: Able to pick up shoe, needs supervision From Standing Position, Turn to Look Behind Over each Shoulder: Turn sideways only but  maintains balance Turn 360 Degrees: Needs assistance while turning (Multiple LOB with turns. ) Standing Unsupported, Alternately Place Feet on Step/Stool: Needs assistance to keep from falling or unable to try Standing Unsupported, One Foot in Front: Loses balance while stepping or standing Standing on One Leg: Unable to try or needs assist to prevent fall Total Score: 30         Pertinent Vitals/Pain Pain Assessment: No/denies pain    Home Living Family/patient expects to be discharged to:: Private residence Living Arrangements: Alone Available Help at Discharge:  (no nearby family, brother in [place], cousins in [place]) Type of Home: Mobile home Home Access: Stairs to enter;Ramped entrance     Home Layout: One level Home Equipment: Environmental consultant - 2 wheels;Cane - single point;Tub bench      Prior Function Level of Independence: Independent               Hand Dominance   Dominant Hand: Left (Signs his name Left, throws balls Right)    Extremity/Trunk Assessment   Upper Extremity Assessment: LUE deficits/detail (Grip strength =, BUE strength = but LUE requires additional time to generate force and to cessate force;  8-point digital opposition: L 17s, R 12.6s. ) RUE Deficits / Details: Sensation intact     LUE Deficits / Details: Sensation intact   Lower Extremity Assessment: Generalized weakness;LLE deficits/detail         Communication   Communication: Expressive difficulties (dysarthric speech)  Cognition Arousal/Alertness: Lethargic Behavior During Therapy: WFL for tasks assessed/performed Overall Cognitive Status: Impaired/Different from baseline Area of Impairment: Attention               General Comments: Teaching of LUE digital opposition task takes heavy visual and verbal cues.     General Comments      Exercises        Assessment/Plan    PT Assessment Patient needs continued PT services  PT Diagnosis Difficulty walking;Hemiplegia  dominant side;Abnormality of gait;Generalized weakness;Altered mental status   PT Problem List Decreased strength;Decreased mobility;Decreased range of motion;Decreased coordination;Decreased cognition;Decreased knowledge of use of DME;Decreased balance;Obesity;Decreased knowledge of precautions;Decreased safety awareness  PT Treatment Interventions DME instruction;Gait training;Functional mobility training;Therapeutic activities;Patient/family education;Balance training;Therapeutic exercise   PT Goals (Current goals can be found in the Care Plan section) Acute Rehab PT Goals Patient Stated Goal: Improve facial droop and dysarthria PT Goal Formulation: With patient Time For Goal Achievement: 08/09/15 Potential to Achieve Goals: Fair    Frequency 7X/week   Barriers to discharge Decreased caregiver support      Co-evaluation               End of Session Equipment Utilized During Treatment: Gait belt Activity Tolerance: Patient tolerated treatment well;No increased pain Patient left: in chair;with chair alarm set Nurse Communication: Other (comment);Mobility status         Time: [id number] PT Time Calculation (min) (ACUTE ONLY): 1436 min   Charges:   PT Evaluation $PT Eval Moderate Complexity: 1 Procedure PT Treatments $Therapeutic Activity: 23-37 mins   PT G Codes:        5:10 PM, 2015-08-14 Etta Grandchild, PT, DPT PRN Physical Therapist at [place] License # [id number] (wireless)  [phone] (mobile)

## 2015-07-26 NOTE — Plan of Care (Signed)
Problem: Acute Rehab PT Goals(only PT should resolve) Goal: Patient Will Transfer Sit To/From Stand Pt will transfer sit to/from-stand with LRAD at [place] without loss-of-balance to demonstrate good safety awareness for independent mobility in home.   Pt will improve balance during high level mobility tasks as evident in Berg Balance Score increase from 30-45/56.  Goal: Pt Will Ambulate Pt will ambulate with LRAD at [place] using a step-through pattern and equal step length for a distance greater than 1053ft to demonstrate the ability to perform safe limited community distance ambulation at discharge.

## 2015-07-26 NOTE — Progress Notes (Signed)
Patient BP 160/100 manually also c/o  Of nausea. RN informed Dr Shanon Brow, orders given:  Zofran 4mg  q6hrs prn

## 2015-07-27 ENCOUNTER — Inpatient Hospital Stay (HOSPITAL_COMMUNITY): Payer: Medicare Other

## 2015-07-27 LAB — GLUCOSE, CAPILLARY
GLUCOSE-CAPILLARY: 125 mg/dL — AB (ref 65–99)
Glucose-Capillary: 132 mg/dL — ABNORMAL HIGH (ref 65–99)

## 2015-07-27 LAB — ECHOCARDIOGRAM COMPLETE
Height: 70 in
Weight: 3676.8 oz

## 2015-07-27 MED ORDER — ATORVASTATIN CALCIUM 40 MG PO TABS: 40.0000 mg | ORAL_TABLET | Freq: Every day | ORAL | Status: DC

## 2015-07-27 MED ORDER — ASPIRIN 325 MG PO TABS: 325.0000 mg | ORAL_TABLET | Freq: Every day | ORAL | Status: DC

## 2015-07-27 NOTE — Discharge Summary (Signed)
Physician Discharge Summary  Robert Walters [id number] DOB: Apr 19, 1956 DOA: 07/25/2015  PCP: Maximino Greenland, MD  Admit date: 07/25/2015 Discharge date: 07/27/2015  Time spent: 35 minutes  Recommendations for Outpatient Follow-up:  1. Follow up with PCP in 1-2 weeks 2. Follow up with neurologist in 1-2 weeks 3. HH PT and speech therapy will be arranged   Discharge Diagnoses:  Principal Problem:   Stroke [facility]) Active Problems:   Pure hypercholesterolemia   HTN (hypertension)   Diabetes mellitus type 2 with complications (HCC)   BPH (benign prostatic hyperplasia)   GERD (gastroesophageal reflux disease)   Bipolar 1 disorder (HCC)   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   Discharge Condition: improved  Diet recommendation: carb modified  Filed Weights   07/25/15 1058 07/25/15 1600  Weight: 104.327 kg (230 lb) 104.237 kg (229 lb 12.8 oz)    History of present illness:  Robert Walters is a 60 y.o. male with history of HTN, DM and HLD who was in his usual state of health last night when he went to bed. He woke up this morning with complaints of right sided facial droop and subjective slurring of speech. He denied any difficulty swallowing or changes in vision. He felt generally weak, but did not endorse any unilateral weakness or numbness. He denies any numbness or paresthesias. He has not had any fever, cough, shortness of breath, nausea, vomiting or diarrhea. He was evaluated in the ED and was found to have an acute stroke on MRI. He will be admitted for further management.  Hospital Course:  The pt was found to have an acute stroke as revealed by an MRI brain. CT angio of head/neck did not have any significant finding. ECHO was also unremarkable. A1c is currently in process and lipid panel showed high triglycerides. Will increase his Lipitor from 20 to 40 mg, and increase asa to 325 mg. He was seen by speech and physical therapy and recommended to home health services. He should follow up  with neurology in 1-2 weeks. He is otherwise ready for discharge. 1. Diabetes. Metformin held since patient received IV dye. Continue home regimen on discharge. 2. Bipolar 1 disorder. Will continue outpatient regimen 3. HTN. Continue outpatient regimen 4. BPH. Continue flomax 5. HLD. Continue statin. Check lipids. Increase home Lipitor dose. 6. GERD. Continue PPI.  Procedures:  ECHO Study Conclusions  - Left ventricle: The cavity size was normal. There was mild  concentric hypertrophy. Systolic function was normal. Wall motion  was normal; there were no regional wall motion abnormalities.  Doppler parameters are consistent with abnormal left ventricular  relaxation (grade 1 diastolic dysfunction). - Aortic root: The aortic root was mildly dilated. - Mitral valve: There was mild regurgitation. - Left atrium: The atrium was mildly dilated.  Consultations:  Neuro  Physical therapy  Discharge Exam: Filed Vitals:   07/27/15 0000 07/27/15 0400  BP: 168/98 147/95  Pulse: 69 66  Temp: 97.9 F (36.6 C) 97.5 F (36.4 C)  Resp: 20 19     General: NAD, looks comfortable  Cardiovascular: RRR, S1, S2   Respiratory: clear bilaterally, No wheezing, rales or rhonchi   Abdomen: soft, non tender, no distention , bowel sounds normal  Musculoskeletal: No edema b/l  Discharge Instructions    Current Discharge Medication List    CONTINUE these medications which have NOT CHANGED   Details  aspirin 81 MG tablet Take 1 tablet (81 mg total) by mouth daily. Qty: 30 tablet, Refills: 12  atenolol (TENORMIN) 100 MG tablet Take 100 mg by mouth 2 (two) times daily.      carbamazepine (EPITOL) 200 MG tablet Take 200 mg by mouth 2 (two) times daily.    Cholecalciferol (VITAMIN D) 2000 UNITS CAPS Take 1 capsule by mouth daily.    FLUoxetine (PROZAC) 20 MG capsule Take 20 mg by mouth daily.    glipiZIDE (GLUCOTROL) 10 MG tablet Take 10 mg by mouth daily.     lamoTRIgine  (LAMICTAL) 200 MG tablet Take 400 mg by mouth at bedtime.     LORazepam (ATIVAN) 1 MG tablet Take 1 mg by mouth every 8 (eight) hours.    losartan (COZAAR) 100 MG tablet Take 1 tablet (100 mg total) by mouth daily. Qty: 90 tablet, Refills: 0    metFORMIN (GLUCOPHAGE) 1000 MG tablet Take 1,000 mg by mouth 2 (two) times daily with a meal.      metoCLOPramide (REGLAN) 5 MG tablet Take 5 mg by mouth every morning.     pantoprazole (PROTONIX) 40 MG tablet Take 40 mg by mouth every morning.     Tamsulosin HCl (FLOMAX) 0.4 MG CAPS Take 0.4 mg by mouth 2 (two) times daily.    zolpidem (AMBIEN) 10 MG tablet Take 1 tablet (10 mg total) by mouth at bedtime as needed for sleep. Qty: 30 tablet, Refills: 0       No Known Allergies    The results of significant diagnostics from this hospitalization (including imaging, microbiology, ancillary and laboratory) are listed below for reference.    Significant Diagnostic Studies: Ct Angio Head W/cm &/or Wo Cm  07/25/2015  CLINICAL DATA:  Acute right brain stroke. EXAM: CT ANGIOGRAPHY HEAD AND NECK TECHNIQUE: Multidetector CT imaging of the head and neck was performed using the standard protocol during bolus administration of intravenous contrast. Multiplanar CT image reconstructions and MIPs were obtained to evaluate the vascular anatomy. Carotid stenosis measurements (when applicable) are obtained utilizing NASCET criteria, using the distal internal carotid diameter as the denominator. CONTRAST:  18mL OMNIPAQUE IOHEXOL 350 MG/ML SOLN COMPARISON:  MRI 07/25/2015 FINDINGS: CT HEAD Brain: Acute infarct in the right parietal white matter seen on MRI is difficult to see on CT. Chronic infarcts are present in the corona radiata on the right and in the right anterior limb internal capsule. Chronic infarct left caudate head. Negative for hemorrhage or mass. Calvarium and skull base: Negative Paranasal sinuses: Negative Orbits: Negative CTA NECK Aortic arch: Normal  aortic arch. Proximal great vessels widely patent. Right carotid system: Right carotid widely patent. There is mild atherosclerotic disease at the carotid bifurcation without significant stenosis. Left carotid system: Mild atherosclerotic disease of the carotid bifurcation. No significant carotid stenosis. Vertebral arteries:Both vertebral arteries patent to the basilar. Calcific stenosis distal right vertebral artery. Skeleton: Mild facet degeneration in the cervical spine. No acute bony abnormality. Other neck: Negative for mass or adenopathy in the neck. CTA HEAD Anterior circulation: Mild atherosclerotic calcification in the cavernous carotid bilaterally without significant stenosis. Anterior and middle cerebral arteries patent bilaterally without stenosis or aneurysm. Posterior circulation: Mild calcific stenosis distal right vertebral artery. Left vertebral artery widely patent. PICA patent bilaterally. Basilar widely patent. AICA, superior cerebellar, and posterior cerebral arteries widely patent bilaterally. Negative for cerebral aneurysm Venous sinuses: Left transverse sinus widely patent. Sagittal sinus widely patent. Hypoplastic right transverse sinus the. Anatomic variants: No aneurysm Delayed phase: Normal enhancement on delayed imaging. No abnormal enhancement IMPRESSION: Acute infarct right parietal white matter best seen on MRI.  Mild atherosclerotic disease in the carotid bifurcation bilaterally. No significant carotid or vertebral stenosis in the neck. Mild stenosis distal right vertebral artery Mild atherosclerotic disease in the cavernous carotid. No significant intracranial stenosis. Electronically Signed   By: Franchot Gallo M.D.   On: 07/25/2015 14:35   Ct Angio Neck W/cm &/or Wo/cm  07/25/2015  CLINICAL DATA:  Acute right brain stroke. EXAM: CT ANGIOGRAPHY HEAD AND NECK TECHNIQUE: Multidetector CT imaging of the head and neck was performed using the standard protocol during bolus  administration of intravenous contrast. Multiplanar CT image reconstructions and MIPs were obtained to evaluate the vascular anatomy. Carotid stenosis measurements (when applicable) are obtained utilizing NASCET criteria, using the distal internal carotid diameter as the denominator. CONTRAST:  68mL OMNIPAQUE IOHEXOL 350 MG/ML SOLN COMPARISON:  MRI 07/25/2015 FINDINGS: CT HEAD Brain: Acute infarct in the right parietal white matter seen on MRI is difficult to see on CT. Chronic infarcts are present in the corona radiata on the right and in the right anterior limb internal capsule. Chronic infarct left caudate head. Negative for hemorrhage or mass. Calvarium and skull base: Negative Paranasal sinuses: Negative Orbits: Negative CTA NECK Aortic arch: Normal aortic arch. Proximal great vessels widely patent. Right carotid system: Right carotid widely patent. There is mild atherosclerotic disease at the carotid bifurcation without significant stenosis. Left carotid system: Mild atherosclerotic disease of the carotid bifurcation. No significant carotid stenosis. Vertebral arteries:Both vertebral arteries patent to the basilar. Calcific stenosis distal right vertebral artery. Skeleton: Mild facet degeneration in the cervical spine. No acute bony abnormality. Other neck: Negative for mass or adenopathy in the neck. CTA HEAD Anterior circulation: Mild atherosclerotic calcification in the cavernous carotid bilaterally without significant stenosis. Anterior and middle cerebral arteries patent bilaterally without stenosis or aneurysm. Posterior circulation: Mild calcific stenosis distal right vertebral artery. Left vertebral artery widely patent. PICA patent bilaterally. Basilar widely patent. AICA, superior cerebellar, and posterior cerebral arteries widely patent bilaterally. Negative for cerebral aneurysm Venous sinuses: Left transverse sinus widely patent. Sagittal sinus widely patent. Hypoplastic right transverse sinus  the. Anatomic variants: No aneurysm Delayed phase: Normal enhancement on delayed imaging. No abnormal enhancement IMPRESSION: Acute infarct right parietal white matter best seen on MRI. Mild atherosclerotic disease in the carotid bifurcation bilaterally. No significant carotid or vertebral stenosis in the neck. Mild stenosis distal right vertebral artery Mild atherosclerotic disease in the cavernous carotid. No significant intracranial stenosis. Electronically Signed   By: Franchot Gallo M.D.   On: 07/25/2015 14:35   Mr Brain Wo Contrast  07/25/2015  CLINICAL DATA:  Right-sided numbness in weakness with right-sided facial droop and slurred speech. Acute presentation. EXAM: MRI HEAD WITHOUT CONTRAST TECHNIQUE: Multiplanar, multiecho pulse sequences of the brain and surrounding structures were obtained without intravenous contrast. COMPARISON:  08/10/2014 FINDINGS: Diffusion imaging shows an 8 mm acute infarction within the subcortical white matter of the right parietal lobe. No other acute infarction. There are extensive chronic small vessel ischemic changes throughout the pons. No focal cerebellar insult. Old small vessel ischemic changes are present throughout the thalami I, affecting the basal ganglia in the hemispheric white matter. No large vessel territory infarction. No mass lesion, hemorrhage, hydrocephalus or extra-axial collection. No pituitary mass. No inflammatory sinus disease. No skull or skullbase lesion. Major vessels at the base of the brain show flow. IMPRESSION: 8 mm acute infarction in the right parietal white matter. No swelling or hemorrhage. Extensive chronic small vessel ischemic changes elsewhere throughout the brain as outlined above. Electronically  Signed   By: Nelson Chimes M.D.   On: 07/25/2015 12:37    Microbiology: No results found for this or any previous visit (from the past 240 hour(s)).   Labs: Basic Metabolic Panel:  Recent Labs Lab 07/25/15 1107  NA 139  K 4.5  CL  106  CO2 22  GLUCOSE 173*  BUN 24*  CREATININE 1.17  CALCIUM 8.8*   Liver Function Tests:  Recent Labs Lab 07/25/15 1107  AST 29  ALT 32  ALKPHOS 93  BILITOT 0.5  PROT 6.6  ALBUMIN 4.0   No results for input(s): LIPASE, AMYLASE in the last 168 hours. No results for input(s): AMMONIA in the last 168 hours. CBC:  Recent Labs Lab 07/25/15 1107  WBC 5.9  NEUTROABS 3.9  HGB 13.0  HCT 37.9*  MCV 89.8  PLT 125*   Cardiac Enzymes:  Recent Labs Lab 07/25/15 1107  TROPONINI <0.03   BNP: BNP (last 3 results) No results for input(s): BNP in the last 8760 hours.  ProBNP (last 3 results) No results for input(s): PROBNP in the last 8760 hours.  CBG:  Recent Labs Lab 07/26/15 0758 07/26/15 1211 07/26/15 1641 07/26/15 2108 07/27/15 0736  GLUCAP 126* 157* 136* 100* 125*       Signed:  Kathie Dike, MD.  [facility] 07/27/2015, 8:51 AM  By signing my name below, I, Delene Ruffini, attest that this documentation has been prepared under the direction and in the presence of Kathie Dike, MD. Electronically Signed: Delene Ruffini 07/27/2015 12:45pm  I, Dr. Kathie Dike, personally performed the services described in this documentaiton. All medical record entries made by the scribe were at my direction and in my presence. I have reviewed the chart and agree that the record reflects my personal performance and is accurate and complete  Kathie Dike, MD, 07/27/2015 1:00 PM

## 2015-07-27 NOTE — Evaluation (Signed)
Speech Language Pathology Evaluation Patient Details Name: Robert Walters MRN: [id number] DOB: Mar 30, 1956 Today's Date: 07/27/2015 Time: [id number] SLP Time Calculation (min) (ACUTE ONLY): 40 min  Problem List:  Patient Active Problem List   Diagnosis Date Noted  . Stroke ([place]) 07/25/2015  . 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 Dizziness 08/09/2014  . TIA (transient ischemic attack) 05/15/2012  . Blurred vision 05/15/2012  . Ataxia 05/15/2012  . Diabetes mellitus type 2 with complications ([place]) [id number]  . BPH (benign prostatic hyperplasia) 05/15/2012  . GERD (gastroesophageal reflux disease) 05/15/2012  . Obesity 05/15/2012  . Bipolar 1 disorder ([place]) 05/15/2012  . HTN (hypertension) 10/30/2010  . Cardiovascular risk factor 10/30/2010  . Pure hypercholesterolemia 12/05/2008   Past Medical History:  Past Medical History  Diagnosis Date  . Hypertension   . Hyperlipidemia   . Obesity   . Acid reflux disease   . Gastritis   . BPH (benign prostatic hypertrophy)   . Vertigo   . Normal nuclear stress test Dec 2011  . Type II diabetes mellitus ([place])   . H/O hiatal hernia   . TIA (transient ischemic attack) 05/15/2012    "they think I've had one this am @ 0130" (05/15/2012)   Past Surgical History:  Past Surgical History  Procedure Laterality Date  . Cardiovascular stress test  2009    EF 63%  . No past surgeries     HPI:  Robert Walters is a 60 y.o. male with history of HTN, DM and HLD who was in his usual state of health last night when he went to bed. He woke up this morning with complaints of right sided facial droop and subjective slurring of speech. He denied any difficulty swallowing or changes in vision. He felt generally weak, but did not endorse any unilateral weakness or numbness. He denies any numbness or paresthesias. He has not had any fever, cough, shortness of breath, nausea, vomiting or diarrhea. He was evaluated in the ED and was found to have acute  stroke on MRI.    Assessment / Plan / Recommendation Clinical Impression  Robert Walters is a 60 yo male who presents with mild dysarthria. Oral motor evaluation reveals mild weakness Rt labial and lingual which negatively impacts speech intelligibility. Although SLP understood ~95% of conversational speech, pt reports feeling frustrated about his "slurred speech". Language skills are intact; pt verbose and reports that he is mostly alone and finds that he is very talkative when he has someone to talk to. He reports that he lives in a mobile home and has minimal interaction with anyone other than visiting his father at [facility] (but hasn't seen him in 6 weeks). He has been on disability since the age of 39 and mostly stays at home "resting, watching TV, and occasionally riding up into town". He typically eats two meals per day and cooking is limited to reheating foods in the microwave. He handles is medications and finances independently. Cognitively, assessement revealed WNL except for 4 word recall after 10 minutes. Pt able to recall 2/4 independently and required a cue for the other two words. Pt feels like the only thing different about him at this time is his speech. He did have some difficulty repositioning himself in bed with SLP assist and is not sure how he will get home from the hospital when he is discharged (he appears to have very limited support). Robert Walters spoke about church frequently, but has not gone since  his mother passed (3 years ago and he's been pretty depressed since that time). SLP suggested returning to church for community support. Recommend home health SLP services (short term) to address mild dysarthria and further assessment of executive function in home environment. Above to Dr. Roderic Palau. Pt in agreement with plan of care and states that he does not want to go into a "nursing home".     SLP Assessment  All further Speech Lanaguage Pathology  needs can be addressed in the  next venue of care    Follow Up Recommendations  Home health SLP    Frequency and Duration min 1 x/week  1 week      SLP Evaluation Prior Functioning  Cognitive/Linguistic Baseline: Within functional limits Type of Home: Mobile home  Lives With: Alone Available Help at Discharge:  (no one) Education: 12th grade education Vocation: On disability (Disability since the age of 35)   Cognition  Overall Cognitive Status: Within Functional Limits for tasks assessed Arousal/Alertness: Awake/alert Orientation Level: Oriented X4 Memory: Appears intact (mild STM difficulty 9/12) Awareness: Appears intact Problem Solving: Appears intact Safety/Judgment: Appears intact    Comprehension  Auditory Comprehension Overall Auditory Comprehension: Appears within functional limits for tasks assessed Yes/No Questions: Within Functional Limits Commands: Within Functional Limits Conversation: Complex Visual Recognition/Discrimination Discrimination: Within Function Limits Reading Comprehension Reading Status: Within funtional limits    Expression Expression Primary Mode of Expression: Verbal Verbal Expression Overall Verbal Expression: Appears within functional limits for tasks assessed Initiation: No impairment Level of Generative/Spontaneous Verbalization: Conversation Repetition: No impairment Naming: No impairment Pragmatics: Impairment Impairments: Turn Taking (however pt reports being very lonely) Interfering Components: Speech intelligibility Non-Verbal Means of Communication: Not applicable Written Expression Dominant Hand: Left Written Expression: Not tested   Oral / Motor  Oral Motor/Sensory Function Overall Oral Motor/Sensory Function: Mild impairment Facial ROM: Reduced right Facial Symmetry: Abnormal symmetry right Facial Strength: Reduced right Facial Sensation: Within Functional Limits Lingual ROM: Reduced right Lingual Symmetry: Abnormal symmetry right Lingual  Strength: Reduced Lingual Sensation: Within Functional Limits Velum: Within Functional Limits (difficult to visualize due to small oral cavity) Mandible: Within Functional Limits Motor Speech Overall Motor Speech: Impaired Respiration: Within functional limits Phonation: Normal Resonance: Within functional limits Articulation: Impaired Level of Impairment: Conversation Intelligibility: Intelligibility reduced Word: 75-100% accurate Phrase: 75-100% accurate Sentence: 75-100% accurate Conversation: 75-100% accurate Motor Planning: Witnin functional limits Motor Speech Errors: Not applicable Effective Techniques: Slow rate;Over-articulate   Thank you,  Genene Churn, [place]                     [place] 07/27/2015, 12:50 PM

## 2015-07-27 NOTE — Progress Notes (Signed)
*  PRELIMINARY RESULTS* Echocardiogram 2D Echocardiogram has been performed.  Leavy Cella 07/27/2015, 10:40 AM

## 2015-07-27 NOTE — Progress Notes (Signed)
Discharged PT per MD order and protocol. Discharge handouts reviewed/explained. Education completed.  Pt verbalized understanding and left with all belongings. Prescriptions given and explained. VSS. IV catheter D/C.  Patient wheeled down by staff member.  Cab was called for pt.

## 2015-07-28 LAB — HEMOGLOBIN A1C
HEMOGLOBIN A1C: 6.3 % — AB (ref 4.8–5.6)
Mean Plasma Glucose: 134 mg/dL

## 2015-07-29 ENCOUNTER — Ambulatory Visit: Payer: Medicare Other | Admitting: Cardiovascular Disease

## 2015-07-30 NOTE — Care Management (Signed)
Received call from [facility] that pt's [facility] has not started. Pt called and stated he would like to use [facility]. [facility] called and asked to take referral, Blake Divine, of [facility], will obtain pt info from chart and will contact pt.

## 2015-08-01 ENCOUNTER — Encounter (HOSPITAL_COMMUNITY): Payer: Self-pay | Admitting: Emergency Medicine

## 2015-08-01 ENCOUNTER — Emergency Department (HOSPITAL_COMMUNITY): Payer: Medicare Other

## 2015-08-01 ENCOUNTER — Emergency Department (HOSPITAL_COMMUNITY)
Admission: EM | Admit: 2015-08-01 | Discharge: 2015-08-01 | Disposition: A | Discharge status: Home / Self Care | Payer: Medicare Other | Attending: Emergency Medicine | Admitting: Emergency Medicine

## 2015-08-01 DIAGNOSIS — Z79899 Other long term (current) drug therapy: Secondary | ICD-10-CM | POA: Insufficient documentation

## 2015-08-01 DIAGNOSIS — W01198A Fall on same level from slipping, tripping and stumbling with subsequent striking against other object, initial encounter: Secondary | ICD-10-CM | POA: Insufficient documentation

## 2015-08-01 DIAGNOSIS — Z7982 Long term (current) use of aspirin: Secondary | ICD-10-CM | POA: Insufficient documentation

## 2015-08-01 DIAGNOSIS — Y9301 Activity, walking, marching and hiking: Secondary | ICD-10-CM | POA: Insufficient documentation

## 2015-08-01 DIAGNOSIS — E669 Obesity, unspecified: Secondary | ICD-10-CM | POA: Diagnosis not present

## 2015-08-01 DIAGNOSIS — Z7984 Long term (current) use of oral hypoglycemic drugs: Secondary | ICD-10-CM | POA: Insufficient documentation

## 2015-08-01 DIAGNOSIS — E119 Type 2 diabetes mellitus without complications: Secondary | ICD-10-CM | POA: Diagnosis not present

## 2015-08-01 DIAGNOSIS — Y92810 Car as the place of occurrence of the external cause: Secondary | ICD-10-CM | POA: Insufficient documentation

## 2015-08-01 DIAGNOSIS — S0990XA Unspecified injury of head, initial encounter: Secondary | ICD-10-CM

## 2015-08-01 DIAGNOSIS — S199XXA Unspecified injury of neck, initial encounter: Secondary | ICD-10-CM | POA: Diagnosis not present

## 2015-08-01 DIAGNOSIS — E7800 Pure hypercholesterolemia, unspecified: Secondary | ICD-10-CM | POA: Diagnosis not present

## 2015-08-01 DIAGNOSIS — E785 Hyperlipidemia, unspecified: Secondary | ICD-10-CM | POA: Diagnosis not present

## 2015-08-01 DIAGNOSIS — Y999 Unspecified external cause status: Secondary | ICD-10-CM | POA: Insufficient documentation

## 2015-08-01 DIAGNOSIS — R531 Weakness: Secondary | ICD-10-CM | POA: Diagnosis not present

## 2015-08-01 DIAGNOSIS — Z8673 Personal history of transient ischemic attack (TIA), and cerebral infarction without residual deficits: Secondary | ICD-10-CM | POA: Diagnosis not present

## 2015-08-01 DIAGNOSIS — I69322 Dysarthria following cerebral infarction: Secondary | ICD-10-CM | POA: Diagnosis not present

## 2015-08-01 DIAGNOSIS — I10 Essential (primary) hypertension: Secondary | ICD-10-CM | POA: Insufficient documentation

## 2015-08-01 DIAGNOSIS — I6789 Other cerebrovascular disease: Secondary | ICD-10-CM | POA: Diagnosis not present

## 2015-08-01 DIAGNOSIS — N400 Enlarged prostate without lower urinary tract symptoms: Secondary | ICD-10-CM | POA: Diagnosis not present

## 2015-08-01 DIAGNOSIS — K219 Gastro-esophageal reflux disease without esophagitis: Secondary | ICD-10-CM | POA: Diagnosis not present

## 2015-08-01 DIAGNOSIS — I69351 Hemiplegia and hemiparesis following cerebral infarction affecting right dominant side: Secondary | ICD-10-CM | POA: Diagnosis not present

## 2015-08-01 DIAGNOSIS — S098XXA Other specified injuries of head, initial encounter: Secondary | ICD-10-CM | POA: Diagnosis not present

## 2015-08-01 MED ORDER — ONDANSETRON 4 MG PO TBDP: ORAL_TABLET | ORAL | Status: DC

## 2015-08-01 MED ORDER — ONDANSETRON 4 MG PO TBDP
4.0000 mg | ORAL_TABLET | Freq: Once | ORAL | Status: AC
  Administered 2015-08-01: 4 mg via ORAL

## 2015-08-01 MED ORDER — ONDANSETRON 4 MG PO TBDP
4.0000 mg | ORAL_TABLET | Freq: Once | ORAL | Status: AC
  Administered 2015-08-01: 4 mg via ORAL
  Filled 2015-08-01: qty 1

## 2015-08-01 MED ORDER — ONDANSETRON 4 MG PO TBDP
ORAL_TABLET | ORAL | Status: AC
  Filled 2015-08-01: qty 1

## 2015-08-01 NOTE — Discharge Instructions (Signed)
Follow up with your md next week.  Return if  problems °

## 2015-08-01 NOTE — ED Notes (Signed)
Pt states he was getting out of the car and fell backwards hitting his head.  Pt states he was unable to get up on his own and crawled to get help.  Had a stroke on Friday and continues to have residual right sided weakness.  Pt was also incontinent of stool prior to arrival.

## 2015-08-01 NOTE — ED Provider Notes (Signed)
CSN: [id number]     Arrival date & time 08/01/15  1717 History   First MD Initiated Contact with Patient 08/01/15 1724     Chief Complaint  Patient presents with  . Fall     (Consider location/radiation/quality/duration/timing/severity/associated sxs/prior Treatment) Patient is a 60 y.o. male presenting with fall. The history is provided by the patient (Patient states his walking misstep and fell back and hit his head no loss of consciousness patient does have some pain in the back said).  Fall This is a new problem. The current episode started 12 to 24 hours ago. The problem occurs rarely. The problem has been resolved. Associated symptoms include headaches. Pertinent negatives include no chest pain and no abdominal pain. Nothing aggravates the symptoms. Nothing relieves the symptoms.    Past Medical History  Diagnosis Date  . Hypertension   . Hyperlipidemia   . Obesity   . Acid reflux disease   . Gastritis   . BPH (benign prostatic hypertrophy)   . Vertigo   . Normal nuclear stress test Dec 2011  . Type II diabetes mellitus ([place])   . H/O hiatal hernia   . TIA (transient ischemic attack) 05/15/2012    "they think I've had one this am @ 0130" (05/15/2012)   Past Surgical History  Procedure Laterality Date  . Cardiovascular stress test  2009    EF 63%  . No past surgeries     Family History  Problem Relation Age of Onset  . Hyperlipidemia Father   . Colon cancer Father   . Stroke Mother    Social History  Substance Use Topics  . Smoking status: Never Smoker   . Smokeless tobacco: Never Used  . Alcohol Use: No    Review of Systems  Constitutional: Negative for appetite change and fatigue.  HENT: Negative for congestion, ear discharge and sinus pressure.   Eyes: Negative for discharge.  Respiratory: Negative for cough.   Cardiovascular: Negative for chest pain.  Gastrointestinal: Negative for abdominal pain and diarrhea.  Genitourinary: Negative for frequency and  hematuria.  Musculoskeletal: Negative for back pain.  Skin: Negative for rash.  Neurological: Positive for headaches. Negative for seizures.  Psychiatric/Behavioral: Negative for hallucinations.      Allergies  Review of patient's allergies indicates no known allergies.  Home Medications   Prior to Admission medications   Medication Sig Start Date End Date Taking? Authorizing Provider  aspirin 325 MG tablet Take 1 tablet (325 mg total) by mouth daily. 07/27/15  Yes Kathie Dike, MD  atenolol (TENORMIN) 100 MG tablet Take 100 mg by mouth 2 (two) times daily.     Yes Historical Provider, MD  atorvastatin (LIPITOR) 40 MG tablet Take 1 tablet (40 mg total) by mouth daily at 6 PM. 07/27/15  Yes Kathie Dike, MD  carbamazepine (EPITOL) 200 MG tablet Take 200 mg by mouth 2 (two) times daily.   Yes Historical Provider, MD  Cholecalciferol (VITAMIN D) 2000 UNITS CAPS Take 1 capsule by mouth daily.   Yes Historical Provider, MD  FLUoxetine (PROZAC) 20 MG capsule Take 20 mg by mouth daily.   Yes Historical Provider, MD  glipiZIDE (GLUCOTROL) 10 MG tablet Take 10 mg by mouth daily.    Yes Historical Provider, MD  lamoTRIgine (LAMICTAL) 200 MG tablet Take 400 mg by mouth at bedtime.    Yes Historical Provider, MD  LORazepam (ATIVAN) 1 MG tablet Take 1 mg by mouth every 8 (eight) hours.   Yes Historical Provider, MD  losartan (COZAAR) 100 MG tablet Take 1 tablet (100 mg total) by mouth daily. 07/22/15  Yes Thayer Headings, MD  metFORMIN (GLUCOPHAGE) 1000 MG tablet Take 1,000 mg by mouth 2 (two) times daily with a meal.     Yes Historical Provider, MD  metoCLOPramide (REGLAN) 5 MG tablet Take 5 mg by mouth every morning.    Yes Historical Provider, MD  pantoprazole (PROTONIX) 40 MG tablet Take 40 mg by mouth every morning.    Yes Historical Provider, MD  Tamsulosin HCl (FLOMAX) 0.4 MG CAPS Take 0.4 mg by mouth 2 (two) times daily.   Yes Historical Provider, MD  zolpidem (AMBIEN) 10 MG tablet Take 1  tablet (10 mg total) by mouth at bedtime as needed for sleep. Patient taking differently: Take 10 mg by mouth at bedtime.  08/15/12  Yes Leonard Schwartz, MD  ondansetron (ZOFRAN ODT) 4 MG disintegrating tablet 4mg  ODT q4 hours prn nausea/vomit 08/01/15   Milton Ferguson, MD   BP 159/72 mmHg  Pulse 86  Temp(Src) 98.3 F (36.8 C) (Oral)  Resp 19  Ht 5\' 9"  (1.753 m)  Wt 229 lb 12.8 oz (104.237 kg)  BMI 33.92 kg/m2  SpO2 99% Physical Exam  Constitutional: He is oriented to person, place, and time. He appears well-developed.  HENT:  Head: Normocephalic.  Tender occipital head  Eyes: Conjunctivae and EOM are normal. No scleral icterus.  Neck: Neck supple. No thyromegaly present.  Cardiovascular: Normal rate and regular rhythm.  Exam reveals no gallop and no friction rub.   No murmur heard. Pulmonary/Chest: No stridor. He has no wheezes. He has no rales. He exhibits no tenderness.  Abdominal: He exhibits no distension. There is no tenderness. There is no rebound.  Musculoskeletal: Normal range of motion. He exhibits no edema.  Lymphadenopathy:    He has no cervical adenopathy.  Neurological: He is oriented to person, place, and time. He exhibits normal muscle tone. Coordination normal.  Skin: No rash noted. No erythema.  Psychiatric: He has a normal mood and affect. His behavior is normal.    ED Course  Procedures (including critical care time) Labs Review Labs Reviewed - No data to display  Imaging Review Ct Head Wo Contrast  08/01/2015  CLINICAL DATA:  Fall, fell backwards getting out of his car, struck head, had stroke last Friday, persistent RIGHT-sided weakness, history type II diabetes mellitus, hypertension, TIA, vertigo, hyperlipidemia EXAM: CT HEAD WITHOUT CONTRAST CT CERVICAL SPINE WITHOUT CONTRAST TECHNIQUE: Multidetector CT imaging of the head and cervical spine was performed following the standard protocol without intravenous contrast. Multiplanar CT image reconstructions of  the cervical spine were also generated. COMPARISON:  07/25/2015 FINDINGS: CT HEAD FINDINGS Generalized atrophy. Normal ventricular morphology. No midline shift or mass effect. Old lacunar infarct RIGHT periventricular white matter. No intracranial hemorrhage, mass lesion or evidence acute infarction. No extra-axial fluid collections. Dense calcification anterior falx. Bones and sinuses unremarkable. CT CERVICAL SPINE FINDINGS Prevertebral soft tissues normal thickness. Osseous mineralization normal. Vertebral body and disc space heights maintained. No acute fracture, subluxation, or bone destruction. Multilevel facet degenerative changes. Visualized skullbase intact. Spot bifida occulta C6. Lung apices clear. IMPRESSION: Atrophy with small old RIGHT parietal periventricular white matter infarct. No acute intracranial abnormalities. Degenerative facet disease changes cervical spine. No acute cervical spine abnormalities. Spot bifida occulta C6. Electronically Signed   By: Lavonia Dana M.D.   On: 08/01/2015 19:33   Ct Cervical Spine Wo Contrast  08/01/2015  CLINICAL DATA:  Fall, fell backwards  getting out of his car, struck head, had stroke last Friday, persistent RIGHT-sided weakness, history type II diabetes mellitus, hypertension, TIA, vertigo, hyperlipidemia EXAM: CT HEAD WITHOUT CONTRAST CT CERVICAL SPINE WITHOUT CONTRAST TECHNIQUE: Multidetector CT imaging of the head and cervical spine was performed following the standard protocol without intravenous contrast. Multiplanar CT image reconstructions of the cervical spine were also generated. COMPARISON:  07/25/2015 FINDINGS: CT HEAD FINDINGS Generalized atrophy. Normal ventricular morphology. No midline shift or mass effect. Old lacunar infarct RIGHT periventricular white matter. No intracranial hemorrhage, mass lesion or evidence acute infarction. No extra-axial fluid collections. Dense calcification anterior falx. Bones and sinuses unremarkable. CT CERVICAL  SPINE FINDINGS Prevertebral soft tissues normal thickness. Osseous mineralization normal. Vertebral body and disc space heights maintained. No acute fracture, subluxation, or bone destruction. Multilevel facet degenerative changes. Visualized skullbase intact. Spot bifida occulta C6. Lung apices clear. IMPRESSION: Atrophy with small old RIGHT parietal periventricular white matter infarct. No acute intracranial abnormalities. Degenerative facet disease changes cervical spine. No acute cervical spine abnormalities. Spot bifida occulta C6. Electronically Signed   By: Lavonia Dana M.D.   On: 08/01/2015 19:33   I have personally reviewed and evaluated these images and lab results as part of my medical decision-making.   EKG Interpretation None      MDM   Final diagnoses:  Head injury, initial encounter    CT scan of head and neck negative patient will ambulate without problems patient with mild nausea will send home with Zofran and he'll follow-up with his doctor this week    Milton Ferguson, MD 08/01/15 2041

## 2015-08-04 ENCOUNTER — Encounter (HOSPITAL_COMMUNITY): Payer: Self-pay

## 2015-08-04 ENCOUNTER — Emergency Department (HOSPITAL_COMMUNITY)
Admission: EM | Admit: 2015-08-04 | Discharge: 2015-08-04 | Disposition: A | Discharge status: Home / Self Care | Payer: Medicare Other | Attending: Emergency Medicine | Admitting: Emergency Medicine

## 2015-08-04 ENCOUNTER — Emergency Department (HOSPITAL_COMMUNITY): Payer: Medicare Other

## 2015-08-04 DIAGNOSIS — E669 Obesity, unspecified: Secondary | ICD-10-CM | POA: Insufficient documentation

## 2015-08-04 DIAGNOSIS — Z8673 Personal history of transient ischemic attack (TIA), and cerebral infarction without residual deficits: Secondary | ICD-10-CM | POA: Diagnosis not present

## 2015-08-04 DIAGNOSIS — I10 Essential (primary) hypertension: Secondary | ICD-10-CM | POA: Insufficient documentation

## 2015-08-04 DIAGNOSIS — R404 Transient alteration of awareness: Secondary | ICD-10-CM | POA: Diagnosis not present

## 2015-08-04 DIAGNOSIS — E785 Hyperlipidemia, unspecified: Secondary | ICD-10-CM | POA: Insufficient documentation

## 2015-08-04 DIAGNOSIS — I959 Hypotension, unspecified: Secondary | ICD-10-CM

## 2015-08-04 DIAGNOSIS — Z79899 Other long term (current) drug therapy: Secondary | ICD-10-CM | POA: Diagnosis not present

## 2015-08-04 DIAGNOSIS — R55 Syncope and collapse: Secondary | ICD-10-CM | POA: Diagnosis not present

## 2015-08-04 DIAGNOSIS — N179 Acute kidney failure, unspecified: Secondary | ICD-10-CM | POA: Diagnosis not present

## 2015-08-04 DIAGNOSIS — Z7984 Long term (current) use of oral hypoglycemic drugs: Secondary | ICD-10-CM | POA: Diagnosis not present

## 2015-08-04 DIAGNOSIS — E860 Dehydration: Secondary | ICD-10-CM | POA: Diagnosis not present

## 2015-08-04 DIAGNOSIS — Z7982 Long term (current) use of aspirin: Secondary | ICD-10-CM | POA: Diagnosis not present

## 2015-08-04 DIAGNOSIS — R61 Generalized hyperhidrosis: Secondary | ICD-10-CM | POA: Diagnosis not present

## 2015-08-04 LAB — TROPONIN I: Troponin I: <0.03 ng/mL (ref <0.031)

## 2015-08-04 LAB — CBC WITH DIFFERENTIAL/PLATELET
Basophils Absolute: 0 10*3/uL (ref 0.0–0.1)
Basophils Relative: 0 %
Eosinophils Absolute: 0.1 10*3/uL (ref 0.0–0.7)
Eosinophils Relative: 1 %
HCT: 39.9 % (ref 39.0–52.0)
Hemoglobin: 13.8 g/dL (ref 13.0–17.0)
Lymphocytes Relative: 15 %
Lymphs Abs: 1 10*3/uL (ref 0.7–4.0)
MCH: 31.4 pg (ref 26.0–34.0)
MCHC: 34.6 g/dL (ref 30.0–36.0)
MCV: 90.7 fL (ref 78.0–100.0)
Monocytes Absolute: 0.4 10*3/uL (ref 0.1–1.0)
Monocytes Relative: 5 %
Neutro Abs: 5.6 10*3/uL (ref 1.7–7.7)
Neutrophils Relative %: 79 %
Platelets: 135 10*3/uL — ABNORMAL LOW (ref 150–400)
RBC: 4.4 MIL/uL (ref 4.22–5.81)
RDW: 14.1 % (ref 11.5–15.5)
WBC: 7.1 10*3/uL (ref 4.0–10.5)

## 2015-08-04 LAB — COMPREHENSIVE METABOLIC PANEL
ALBUMIN: 4.3 g/dL (ref 3.5–5.0)
ALK PHOS: 88 U/L (ref 38–126)
ALT: 31 U/L (ref 17–63)
AST: 29 U/L (ref 15–41)
Anion gap: 10 (ref 5–15)
BUN: 28 mg/dL — ABNORMAL HIGH (ref 6–20)
CALCIUM: 8.4 mg/dL — AB (ref 8.9–10.3)
CO2: 21 mmol/L — AB (ref 22–32)
CREATININE: 1.58 mg/dL — AB (ref 0.61–1.24)
Chloride: 106 mmol/L (ref 101–111)
GFR calc Af Amer: 53 mL/min — ABNORMAL LOW (ref >60)
GFR calc non Af Amer: 46 mL/min — ABNORMAL LOW (ref >60)
GLUCOSE: 122 mg/dL — AB (ref 65–99)
Potassium: 5 mmol/L (ref 3.5–5.1)
SODIUM: 137 mmol/L (ref 135–145)
Total Bilirubin: 0.6 mg/dL (ref 0.3–1.2)
Total Protein: 7.2 g/dL (ref 6.5–8.1)

## 2015-08-04 LAB — LACTIC ACID, PLASMA: Lactic Acid, Venous: 1.1 mmol/L (ref 0.5–2.0)

## 2015-08-04 NOTE — ED Notes (Signed)
Pt states his home health nurse came out to check on him and his blood pressure was low. Per EMS. Pt was orthostatic on scene. Pt was given a 300 ml bolus of NS by EMS

## 2015-08-04 NOTE — ED Provider Notes (Signed)
CSN: [id number]     Arrival date & time 08/04/15  1729 History   First MD Initiated Contact with Patient 08/04/15 1734     Chief Complaint  Patient presents with  . Hypotension     (Consider location/radiation/quality/duration/timing/severity/associated sxs/prior Treatment) HPI  Isolated hypotension without symptoms. No h/o same. No exacerbating/relieving factors. Resolved PTA.   Past Medical History  Diagnosis Date  . Hypertension   . Hyperlipidemia   . Obesity   . Acid reflux disease   . Gastritis   . BPH (benign prostatic hypertrophy)   . Vertigo   . Normal nuclear stress test Dec 2011  . Type II diabetes mellitus ([place])   . H/O hiatal hernia   . TIA (transient ischemic attack) 05/15/2012    "they think I've had one this am @ 0130" (05/15/2012)   Past Surgical History  Procedure Laterality Date  . Cardiovascular stress test  2009    EF 63%  . No past surgeries     Family History  Problem Relation Age of Onset  . Hyperlipidemia Father   . Colon cancer Father   . Stroke Mother    Social History  Substance Use Topics  . Smoking status: Never Smoker   . Smokeless tobacco: Never Used  . Alcohol Use: No    Review of Systems  Constitutional: Negative for fever and fatigue.  Eyes: Negative for pain.  Respiratory: Negative for cough and shortness of breath.   Gastrointestinal: Negative for nausea and vomiting.  Endocrine: Negative for polydipsia and polyuria.  All other systems reviewed and are negative.     Allergies  Review of patient's allergies indicates no known allergies.  Home Medications   Prior to Admission medications   Medication Sig Start Date End Date Taking? Authorizing Provider  aspirin 325 MG tablet Take 1 tablet (325 mg total) by mouth daily. 07/27/15  Yes Kathie Dike, MD  atenolol (TENORMIN) 100 MG tablet Take 100 mg by mouth 2 (two) times daily.     Yes Historical Provider, MD  atorvastatin (LIPITOR) 40 MG tablet Take 1 tablet (40 mg  total) by mouth daily at 6 PM. 07/27/15  Yes Kathie Dike, MD  carbamazepine (EPITOL) 200 MG tablet Take 200 mg by mouth 2 (two) times daily.   Yes Historical Provider, MD  Cholecalciferol (VITAMIN D) 2000 UNITS CAPS Take 1 capsule by mouth daily.   Yes Historical Provider, MD  FLUoxetine (PROZAC) 20 MG capsule Take 20 mg by mouth daily.   Yes Historical Provider, MD  glipiZIDE (GLUCOTROL) 10 MG tablet Take 10 mg by mouth daily.    Yes Historical Provider, MD  lamoTRIgine (LAMICTAL) 200 MG tablet Take 400 mg by mouth at bedtime.    Yes Historical Provider, MD  LORazepam (ATIVAN) 1 MG tablet Take 1 mg by mouth every 8 (eight) hours.   Yes Historical Provider, MD  losartan (COZAAR) 100 MG tablet Take 1 tablet (100 mg total) by mouth daily. 07/22/15  Yes Thayer Headings, MD  metFORMIN (GLUCOPHAGE) 1000 MG tablet Take 1,000 mg by mouth 2 (two) times daily with a meal.     Yes Historical Provider, MD  metoCLOPramide (REGLAN) 5 MG tablet Take 5 mg by mouth every morning.    Yes Historical Provider, MD  pantoprazole (PROTONIX) 40 MG tablet Take 40 mg by mouth every morning.    Yes Historical Provider, MD  Tamsulosin HCl (FLOMAX) 0.4 MG CAPS Take 0.4 mg by mouth 2 (two) times daily.   Yes Historical  Provider, MD  zolpidem (AMBIEN) 10 MG tablet Take 1 tablet (10 mg total) by mouth at bedtime as needed for sleep. Patient taking differently: Take 10 mg by mouth at bedtime.  08/15/12  Yes Leonard Schwartz, MD  ondansetron (ZOFRAN ODT) 4 MG disintegrating tablet 4mg  ODT q4 hours prn nausea/vomit Patient not taking: Reported on 08/04/2015 08/01/15   Milton Ferguson, MD   BP 146/97 mmHg  Pulse 73  Temp(Src) 97.7 F (36.5 C) (Oral)  Resp 20  Ht 5\' 9"  (1.753 m)  Wt 220 lb (99.791 kg)  BMI 32.47 kg/m2  SpO2 96% Physical Exam  Constitutional: He is oriented to person, place, and time. He appears well-developed and well-nourished.  HENT:  Head: Normocephalic and atraumatic.  Neck: Normal range of motion.   Cardiovascular: Normal rate.   Pulmonary/Chest: Effort normal. No respiratory distress.  Abdominal: Soft. Bowel sounds are normal. He exhibits no distension. There is no tenderness.  Musculoskeletal: Normal range of motion.  Neurological: He is alert and oriented to person, place, and time.  No altered mental status, able to give full seemingly accurate history.  Face is asymmetric with right facial droop, EOM's intact, pupils equal and reactive, vision intact, tongue and uvula midline without deviation Upper and Lower extremity motor 5/5 on left 4/5 on right upper, 5/5 on right lower, intact pain perception in distal extremities, 2+ reflexes in biceps, patella and achilles tendons. Finger to nose normal, heel to shin normal.   Nursing note and vitals reviewed.   ED Course  Procedures (including critical care time) Labs Review Labs Reviewed  COMPREHENSIVE METABOLIC PANEL - Abnormal; Notable for the following:    CO2 21 (*)    Glucose, Bld 122 (*)    BUN 28 (*)    Creatinine, Ser 1.58 (*)    Calcium 8.4 (*)    GFR calc non Af Amer 46 (*)    GFR calc Af Amer 53 (*)    All other components within normal limits  CBC WITH DIFFERENTIAL/PLATELET - Abnormal; Notable for the following:    Platelets 135 (*)    All other components within normal limits  TROPONIN I  LACTIC ACID, PLASMA    Imaging Review Dg Chest 2 View  08/04/2015  CLINICAL DATA:  Syncope EXAM: CHEST  2 VIEW COMPARISON:  May 15, 2012 FINDINGS: There is no edema or consolidation. Heart is borderline enlarged with pulmonary vascularity within normal limits. No adenopathy. No bone lesions. IMPRESSION: Mild cardiac prominence.  No edema or consolidation. Electronically Signed   By: Lowella Grip III M.D.   On: 08/04/2015 18:43   I have personally reviewed and evaluated these images and lab results as part of my medical decision-making.   EKG Interpretation   Date/Time:  Monday August 04 2015 17:36:25 EDT Ventricular  Rate:  72 PR Interval:  186 QRS Duration: 107 QT Interval:  412 QTC Calculation: 451 R Axis:   124 Text Interpretation:  Sinus rhythm Low voltage, precordial leads Probable  RVH w/ secondary repol abnormality No significant change since last  tracing Confirmed by KNAPP  MD-J, JON [id number]) on 08/04/2015 6:17:26 PM      MDM   Final diagnoses:  Hypotension, unspecified hypotension type  Dehydration  AKI (acute kidney injury) ([place])    isolated episode of hypotension prior to arrival. Here with silght AKI, tolerating PO. Not one single BP near hypotension, all were slightly hypertensive. Normal lactic. Normal troponin. AKI likely 2/2 decreased PO and dehydration. Fluids given here, will  fu w/ pcp for recheck after a couple days of increased PO fluid intake.   New Prescriptions: Discharge Medication List as of 08/04/2015  8:12 PM      I have personally and contemperaneously reviewed labs and imaging and used in my decision making as above.   A medical screening exam was performed and I feel the patient has had an appropriate workup for their chief complaint at this time and likelihood of emergent condition existing is low. Their vital signs are stable. They have been counseled on decision, discharge, follow up and which symptoms necessitate immediate return to the emergency department.  They verbally stated understanding and agreement with plan and discharged in stable condition.      Merrily Pew, MD 08/05/15 [phone]

## 2015-08-07 DIAGNOSIS — E119 Type 2 diabetes mellitus without complications: Secondary | ICD-10-CM | POA: Diagnosis not present

## 2015-08-08 DIAGNOSIS — I639 Cerebral infarction, unspecified: Secondary | ICD-10-CM | POA: Diagnosis not present

## 2015-08-08 DIAGNOSIS — E785 Hyperlipidemia, unspecified: Secondary | ICD-10-CM | POA: Diagnosis not present

## 2015-08-08 DIAGNOSIS — I10 Essential (primary) hypertension: Secondary | ICD-10-CM | POA: Diagnosis not present

## 2015-08-18 ENCOUNTER — Ambulatory Visit: Payer: Medicare Other | Admitting: Cardiovascular Disease

## 2015-08-25 DIAGNOSIS — R2689 Other abnormalities of gait and mobility: Secondary | ICD-10-CM | POA: Diagnosis not present

## 2015-08-25 DIAGNOSIS — I639 Cerebral infarction, unspecified: Secondary | ICD-10-CM | POA: Diagnosis not present

## 2015-08-26 DIAGNOSIS — E785 Hyperlipidemia, unspecified: Secondary | ICD-10-CM | POA: Diagnosis not present

## 2015-08-26 DIAGNOSIS — I69359 Hemiplegia and hemiparesis following cerebral infarction affecting unspecified side: Secondary | ICD-10-CM | POA: Diagnosis not present

## 2015-08-26 DIAGNOSIS — I10 Essential (primary) hypertension: Secondary | ICD-10-CM | POA: Diagnosis not present

## 2015-08-26 DIAGNOSIS — R55 Syncope and collapse: Secondary | ICD-10-CM | POA: Diagnosis not present

## 2015-09-02 DIAGNOSIS — I739 Peripheral vascular disease, unspecified: Secondary | ICD-10-CM | POA: Diagnosis not present

## 2015-09-08 DIAGNOSIS — E1140 Type 2 diabetes mellitus with diabetic neuropathy, unspecified: Secondary | ICD-10-CM | POA: Diagnosis not present

## 2015-09-08 DIAGNOSIS — R269 Unspecified abnormalities of gait and mobility: Secondary | ICD-10-CM | POA: Diagnosis not present

## 2015-09-08 DIAGNOSIS — M130 Polyarthritis, unspecified: Secondary | ICD-10-CM | POA: Diagnosis not present

## 2015-09-08 DIAGNOSIS — G465 Pure motor lacunar syndrome: Secondary | ICD-10-CM | POA: Diagnosis not present

## 2015-09-17 DIAGNOSIS — E1165 Type 2 diabetes mellitus with hyperglycemia: Secondary | ICD-10-CM | POA: Diagnosis not present

## 2015-09-17 DIAGNOSIS — I10 Essential (primary) hypertension: Secondary | ICD-10-CM | POA: Diagnosis not present

## 2015-09-17 DIAGNOSIS — E559 Vitamin D deficiency, unspecified: Secondary | ICD-10-CM | POA: Diagnosis not present

## 2015-09-17 DIAGNOSIS — E785 Hyperlipidemia, unspecified: Secondary | ICD-10-CM | POA: Diagnosis not present

## 2015-09-22 ENCOUNTER — Ambulatory Visit: Payer: Medicare Other | Admitting: Cardiovascular Disease

## 2015-10-28 DIAGNOSIS — E291 Testicular hypofunction: Secondary | ICD-10-CM | POA: Diagnosis not present

## 2015-10-28 DIAGNOSIS — R3915 Urgency of urination: Secondary | ICD-10-CM | POA: Diagnosis not present

## 2015-10-31 DIAGNOSIS — E119 Type 2 diabetes mellitus without complications: Secondary | ICD-10-CM | POA: Diagnosis not present

## 2015-11-06 ENCOUNTER — Ambulatory Visit: Payer: Medicare Other | Admitting: Cardiovascular Disease

## 2015-12-02 DIAGNOSIS — I739 Peripheral vascular disease, unspecified: Secondary | ICD-10-CM | POA: Diagnosis not present

## 2015-12-08 ENCOUNTER — Other Ambulatory Visit: Payer: Self-pay | Admitting: *Deleted

## 2015-12-08 MED ORDER — LOSARTAN POTASSIUM 100 MG PO TABS: 100.0000 mg | ORAL_TABLET | Freq: Every day | ORAL | 0 refills | Status: DC

## 2015-12-11 DIAGNOSIS — F3181 Bipolar II disorder: Secondary | ICD-10-CM | POA: Diagnosis not present

## 2015-12-11 DIAGNOSIS — E1165 Type 2 diabetes mellitus with hyperglycemia: Secondary | ICD-10-CM | POA: Diagnosis not present

## 2015-12-11 DIAGNOSIS — Z0000 Encounter for general adult medical examination without abnormal findings: Secondary | ICD-10-CM | POA: Diagnosis not present

## 2015-12-11 DIAGNOSIS — E785 Hyperlipidemia, unspecified: Secondary | ICD-10-CM | POA: Diagnosis not present

## 2015-12-11 DIAGNOSIS — I10 Essential (primary) hypertension: Secondary | ICD-10-CM | POA: Diagnosis not present

## 2015-12-11 DIAGNOSIS — E559 Vitamin D deficiency, unspecified: Secondary | ICD-10-CM | POA: Diagnosis not present

## 2016-01-12 ENCOUNTER — Telehealth: Payer: Self-pay | Admitting: Cardiovascular Disease

## 2016-01-12 NOTE — Telephone Encounter (Signed)
New Message  Pt voiced there was suppose to have been a fax from [facility].  Pt voiced if not for [place] to reach out to MD [name] at [place], phone# [phone].  Please f/u with pt or MD [name].

## 2016-01-13 ENCOUNTER — Other Ambulatory Visit: Payer: Self-pay | Admitting: *Deleted

## 2016-01-13 MED ORDER — LOSARTAN POTASSIUM 100 MG PO TABS: 100.0000 mg | ORAL_TABLET | Freq: Every day | ORAL | 0 refills | Status: DC

## 2016-01-15 NOTE — Telephone Encounter (Signed)
Spoke with patient who states at recent appointment with Dr. Laurance Flatten he had an ekg and she noted that it was different than his previous.  He states she told him it was nothing urgent but he wants to make certain [name] has a copy.  I advised that I have not received an ekg and will call her office to have it faxed.  He has an appointment with [name] on 9/21.  He thanked me for the call.   I left a message for medical records at [facility] requesting that most recent ekg be faxed to our office.  I left my name and phone number for their office to call back if needed.

## 2016-01-22 ENCOUNTER — Encounter: Payer: Self-pay | Admitting: Cardiovascular Disease

## 2016-01-22 ENCOUNTER — Ambulatory Visit (INDEPENDENT_AMBULATORY_CARE_PROVIDER_SITE_OTHER): Payer: Medicare Other | Admitting: Cardiovascular Disease

## 2016-01-22 VITALS — BP 126/100 | HR 85 | Ht 69.0 in | Wt 228.2 lb

## 2016-01-22 DIAGNOSIS — E785 Hyperlipidemia, unspecified: Secondary | ICD-10-CM | POA: Diagnosis not present

## 2016-01-22 DIAGNOSIS — I10 Essential (primary) hypertension: Secondary | ICD-10-CM

## 2016-01-22 MED ORDER — METOPROLOL TARTRATE 25 MG PO TABS: 25.0000 mg | ORAL_TABLET | Freq: Two times a day (BID) | ORAL | 11 refills | Status: DC

## 2016-01-22 MED ORDER — LOSARTAN POTASSIUM 100 MG PO TABS: 100.0000 mg | ORAL_TABLET | Freq: Every day | ORAL | 0 refills | Status: DC

## 2016-01-22 NOTE — Progress Notes (Signed)
Cardiology Office Note   Date:  01/22/2016   ID:  Robert Walters, DOB 1955/10/24, MRN [id number]  PCP:  Maximino Greenland, MD  Cardiologist:   Mertie Moores, MD   Chief Complaint  Patient presents with  . Hypertension   Problem List: 1. Hypertension 2. Hyperlipidemia 3. Diabetes mellitus - with gastroparesis 5. Obesity 6. GERD  Feb. 9, 2016:  Robert Walters is a 60 y.o. male who presents for follow up of his HTN . He also has a history of hyperlipidemia that is controlled and monitored by his medical doctor.  He does not eat restricted diet. He does not get any regular exercise. He's had some issues with depression and spends a lot of time in bed.  Sept. 21, 2017:  Robert Walters is seen back today for Further eval Had a stroke in March, was admitted to [facility] Echo showed normal LV function .  Mild MR  Still eats lots of salt .   Does not exercise.    Past Medical History:  Diagnosis Date  . Acid reflux disease   . BPH (benign prostatic hypertrophy)   . Gastritis   . H/O hiatal hernia   . Hyperlipidemia   . Hypertension   . Normal nuclear stress test Dec 2011  . Obesity   . TIA (transient ischemic attack) 05/15/2012   "they think I've had one this am @ 0130" (05/15/2012)  . Type II diabetes mellitus ([place])   . Vertigo     Past Surgical History:  Procedure Laterality Date  . CARDIOVASCULAR STRESS TEST  2009   EF 63%  . NO PAST SURGERIES       Current Outpatient Prescriptions  Medication Sig Dispense Refill  . aspirin 325 MG tablet Take 1 tablet (325 mg total) by mouth daily. 30 tablet 1  . atenolol (TENORMIN) 100 MG tablet Take 100 mg by mouth 2 (two) times daily.      [name] atorvastatin (LIPITOR) 40 MG tablet Take 1 tablet (40 mg total) by mouth daily at 6 PM. 30 tablet 1  . carbamazepine (EPITOL) 200 MG tablet Take 200 mg by mouth 2 (two) times daily.    . Cholecalciferol (VITAMIN D) 2000 UNITS CAPS Take 1 capsule by mouth daily.    [name] FLUoxetine  (PROZAC) 20 MG capsule Take 20 mg by mouth daily.    [name] glipiZIDE (GLUCOTROL) 10 MG tablet Take 10 mg by mouth daily.     [name] lamoTRIgine (LAMICTAL) 200 MG tablet Take 400 mg by mouth at bedtime.     [name] LORazepam (ATIVAN) 1 MG tablet Take 1 mg by mouth every 8 (eight) hours.    [name] losartan (COZAAR) 100 MG tablet Take 1 tablet (100 mg total) by mouth daily. 30 tablet 0  . metFORMIN (GLUCOPHAGE) 1000 MG tablet Take 1,000 mg by mouth 2 (two) times daily with a meal.      . metoCLOPramide (REGLAN) 5 MG tablet Take 5 mg by mouth every morning.     . ondansetron (ZOFRAN ODT) 4 MG disintegrating tablet 4mg  ODT q4 hours prn nausea/vomit 12 tablet 0  . pantoprazole (PROTONIX) 40 MG tablet Take 40 mg by mouth every morning.     . Tamsulosin HCl (FLOMAX) 0.4 MG CAPS Take 0.4 mg by mouth 2 (two) times daily.    [name] zolpidem (AMBIEN) 10 MG tablet Take 1 tablet (10 mg total) by mouth at bedtime as needed for sleep. (Patient taking differently: Take 10 mg by mouth at  bedtime. ) 30 tablet 0   No current facility-administered medications for this visit.     Allergies:   Review of patient's allergies indicates no known allergies.    Social History:  The patient  reports that he has never smoked. He has never used smokeless tobacco. He reports that he does not drink alcohol or use drugs.   Family History:  The patient's family history includes Colon cancer in his father; Hyperlipidemia in his father; Stroke in his mother.    ROS:  Please see the history of present illness.    Review of Systems: Constitutional:  denies fever, chills, diaphoresis, appetite change and fatigue.  HEENT: denies photophobia, eye pain, redness, hearing loss, ear pain, congestion, sore throat, rhinorrhea, sneezing, neck pain, neck stiffness and tinnitus.  Respiratory: denies SOB, DOE, cough, chest tightness, and wheezing.  Cardiovascular: denies chest pain, palpitations and leg swelling.  Gastrointestinal: denies nausea, vomiting,  abdominal pain, diarrhea, constipation, blood in stool.  Genitourinary: denies dysuria, urgency, frequency, hematuria, flank pain and difficulty urinating.  Musculoskeletal: denies  myalgias, back pain, joint swelling, arthralgias and gait problem.   Skin: denies pallor, rash and wound.  Neurological: denies dizziness, seizures, syncope, weakness, light-headedness, numbness and headaches.   Hematological: denies adenopathy, easy bruising, personal or family bleeding history.  Psychiatric/ Behavioral: denies suicidal ideation, mood changes, confusion, nervousness, sleep disturbance and agitation.       All other systems are reviewed and negative.    PHYSICAL EXAM: VS:  BP (!) 126/100 (BP Location: Left Arm, Patient Position: Sitting, Cuff Size: Normal)   Pulse 85   Ht 5\' 9"  (1.753 m)   Wt 228 lb 3.2 oz (103.5 kg)   BMI 33.70 kg/m  , BMI Body mass index is 33.7 kg/m. GEN: Well nourished, well developed, in no acute distress  HEENT: normal  Neck: no JVD, carotid bruits, or masses Cardiac: RRR; no murmurs, rubs, or gallops,no edema  Respiratory:  clear to auscultation bilaterally, normal work of breathing GI: soft, nontender, nondistended, + BS MS: no deformity or atrophy  Skin: warm and dry, no rash Neuro:  Strength and sensation are intact Psych: normal   EKG:  EKG is ordered today. The ekg ordered today demonstrates NSR at 85, Inc. RBBB Inf. Q waves. The ECG is unchanged from previous ECGs      Recent Labs: 08/04/2015: ALT 31; BUN 28; Creatinine, Ser 1.58; Hemoglobin 13.8; Platelets 135; Potassium 5.0; Sodium 137    Lipid Panel    Component Value Date/Time   CHOL 249 (H) 07/26/2015 0647   TRIG 817 (H) 07/26/2015 0647   HDL 35 (L) 07/26/2015 0647   CHOLHDL 7.1 07/26/2015 0647   VLDL UNABLE TO CALCULATE IF TRIGLYCERIDE OVER 400 mg/dL 07/26/2015 0647   LDLCALC UNABLE TO CALCULATE IF TRIGLYCERIDE OVER 400 mg/dL 07/26/2015 0647   LDLDIRECT 124.3 11/10/2010 0953      Wt  Readings from Last 3 Encounters:  01/22/16 228 lb 3.2 oz (103.5 kg)  08/04/15 220 lb (99.8 kg)  08/01/15 229 lb 12.8 oz (104.2 kg)      Other studies Reviewed: Additional studies/ records that were reviewed today include:. Review of the above records demonstrates:    ASSESSMENT AND PLAN:  Problem List: 1. Hypertension - BP is well controlled.   Continue lisinopril 100 mg a day. Atenolol is no longer available. We will start him on metoprolol 25 mg twice a day.  2. Hyperlipidemia - followed by his general medical doctor  3. Diabetes mellitus -  with gastroparesis  5. Obesity - encouraged him to exercise   6. CVA - seems to have recovered nicely .    Current medicines are reviewed at length with the patient today.  The patient does not have concerns regarding medicines.  The following changes have been made:  no change   Disposition:   FU with me in 1 year     Signed, Mertie Moores, MD  01/22/2016 2:52 PM    [facility] [place], [place],   [address] Phone: [phone]; Fax: [phone]

## 2016-01-22 NOTE — Patient Instructions (Signed)
Medication Instructions:  STOP Atenolol START Metoprolol 25 mg twice daily   Labwork: Your physician recommends that you return for lab work in: 1 year on the day of or a few days before your office visit with [name].  You will need to FAST for this appointment - nothing to eat or drink after midnight the night before except water.    Testing/Procedures: None Ordered   Follow-Up: Your physician wants you to follow-up in: 1 year with [name].  You will receive a reminder letter in the mail two months in advance. If you don't receive a letter, please call our office to schedule the follow-up appointment.   If you need a refill on your cardiac medications before your next appointment, please call your pharmacy.   Thank you for choosing [facility]! Christen Bame, RN [phone]

## 2016-02-17 DIAGNOSIS — I739 Peripheral vascular disease, unspecified: Secondary | ICD-10-CM | POA: Diagnosis not present

## 2016-02-26 DIAGNOSIS — Z79899 Other long term (current) drug therapy: Secondary | ICD-10-CM | POA: Diagnosis not present

## 2016-03-08 DIAGNOSIS — E1165 Type 2 diabetes mellitus with hyperglycemia: Secondary | ICD-10-CM | POA: Diagnosis not present

## 2016-03-08 DIAGNOSIS — I10 Essential (primary) hypertension: Secondary | ICD-10-CM | POA: Diagnosis not present

## 2016-03-08 DIAGNOSIS — E785 Hyperlipidemia, unspecified: Secondary | ICD-10-CM | POA: Diagnosis not present

## 2016-03-22 ENCOUNTER — Other Ambulatory Visit: Payer: Self-pay | Admitting: Cardiovascular Disease

## 2016-03-22 MED ORDER — LOSARTAN POTASSIUM 100 MG PO TABS: 100.0000 mg | ORAL_TABLET | Freq: Every day | ORAL | 3 refills | Status: DC

## 2016-05-24 DIAGNOSIS — R3915 Urgency of urination: Secondary | ICD-10-CM | POA: Diagnosis not present

## 2016-06-07 DIAGNOSIS — I739 Peripheral vascular disease, unspecified: Secondary | ICD-10-CM | POA: Diagnosis not present

## 2016-06-14 DIAGNOSIS — E559 Vitamin D deficiency, unspecified: Secondary | ICD-10-CM | POA: Diagnosis not present

## 2016-06-14 DIAGNOSIS — I10 Essential (primary) hypertension: Secondary | ICD-10-CM | POA: Diagnosis not present

## 2016-06-14 DIAGNOSIS — E1165 Type 2 diabetes mellitus with hyperglycemia: Secondary | ICD-10-CM | POA: Diagnosis not present

## 2016-06-14 DIAGNOSIS — E785 Hyperlipidemia, unspecified: Secondary | ICD-10-CM | POA: Diagnosis not present

## 2016-08-09 DIAGNOSIS — G465 Pure motor lacunar syndrome: Secondary | ICD-10-CM | POA: Diagnosis not present

## 2016-08-09 DIAGNOSIS — M130 Polyarthritis, unspecified: Secondary | ICD-10-CM | POA: Diagnosis not present

## 2016-08-09 DIAGNOSIS — R269 Unspecified abnormalities of gait and mobility: Secondary | ICD-10-CM | POA: Diagnosis not present

## 2016-08-09 DIAGNOSIS — E1140 Type 2 diabetes mellitus with diabetic neuropathy, unspecified: Secondary | ICD-10-CM | POA: Diagnosis not present

## 2016-08-23 ENCOUNTER — Emergency Department (HOSPITAL_COMMUNITY): Payer: Medicare Other

## 2016-08-23 ENCOUNTER — Encounter (HOSPITAL_COMMUNITY): Payer: Self-pay | Admitting: Emergency Medicine

## 2016-08-23 ENCOUNTER — Inpatient Hospital Stay (HOSPITAL_COMMUNITY)
Admission: EM | Admit: 2016-08-23 | Discharge: 2016-08-31 | DRG: 854 | Disposition: A | Discharge status: Home Health | Payer: Medicare Other | Attending: General Surgery | Admitting: General Surgery

## 2016-08-23 ENCOUNTER — Inpatient Hospital Stay (HOSPITAL_COMMUNITY): Payer: Medicare Other

## 2016-08-23 DIAGNOSIS — E118 Type 2 diabetes mellitus with unspecified complications: Secondary | ICD-10-CM | POA: Diagnosis not present

## 2016-08-23 DIAGNOSIS — I10 Essential (primary) hypertension: Secondary | ICD-10-CM | POA: Diagnosis present

## 2016-08-23 DIAGNOSIS — F3160 Bipolar disorder, current episode mixed, unspecified: Secondary | ICD-10-CM | POA: Diagnosis not present

## 2016-08-23 DIAGNOSIS — E669 Obesity, unspecified: Secondary | ICD-10-CM | POA: Diagnosis present

## 2016-08-23 DIAGNOSIS — E119 Type 2 diabetes mellitus without complications: Secondary | ICD-10-CM | POA: Diagnosis not present

## 2016-08-23 DIAGNOSIS — N179 Acute kidney failure, unspecified: Secondary | ICD-10-CM | POA: Diagnosis not present

## 2016-08-23 DIAGNOSIS — K8000 Calculus of gallbladder with acute cholecystitis without obstruction: Secondary | ICD-10-CM | POA: Diagnosis not present

## 2016-08-23 DIAGNOSIS — K819 Cholecystitis, unspecified: Secondary | ICD-10-CM

## 2016-08-23 DIAGNOSIS — E785 Hyperlipidemia, unspecified: Secondary | ICD-10-CM | POA: Diagnosis not present

## 2016-08-23 DIAGNOSIS — F419 Anxiety disorder, unspecified: Secondary | ICD-10-CM | POA: Diagnosis present

## 2016-08-23 DIAGNOSIS — A419 Sepsis, unspecified organism: Principal | ICD-10-CM

## 2016-08-23 DIAGNOSIS — K828 Other specified diseases of gallbladder: Secondary | ICD-10-CM | POA: Diagnosis not present

## 2016-08-23 DIAGNOSIS — Z6832 Body mass index (BMI) 32.0-32.9, adult: Secondary | ICD-10-CM

## 2016-08-23 DIAGNOSIS — D696 Thrombocytopenia, unspecified: Secondary | ICD-10-CM | POA: Diagnosis not present

## 2016-08-23 DIAGNOSIS — R1011 Right upper quadrant pain: Secondary | ICD-10-CM | POA: Diagnosis not present

## 2016-08-23 DIAGNOSIS — Z7982 Long term (current) use of aspirin: Secondary | ICD-10-CM

## 2016-08-23 DIAGNOSIS — Z8673 Personal history of transient ischemic attack (TIA), and cerebral infarction without residual deficits: Secondary | ICD-10-CM

## 2016-08-23 DIAGNOSIS — E861 Hypovolemia: Secondary | ICD-10-CM | POA: Diagnosis present

## 2016-08-23 DIAGNOSIS — N183 Chronic kidney disease, stage 3 unspecified: Secondary | ICD-10-CM | POA: Diagnosis present

## 2016-08-23 DIAGNOSIS — Z79899 Other long term (current) drug therapy: Secondary | ICD-10-CM

## 2016-08-23 DIAGNOSIS — N400 Enlarged prostate without lower urinary tract symptoms: Secondary | ICD-10-CM | POA: Diagnosis present

## 2016-08-23 DIAGNOSIS — Z7984 Long term (current) use of oral hypoglycemic drugs: Secondary | ICD-10-CM

## 2016-08-23 DIAGNOSIS — F319 Bipolar disorder, unspecified: Secondary | ICD-10-CM | POA: Diagnosis not present

## 2016-08-23 DIAGNOSIS — K810 Acute cholecystitis: Secondary | ICD-10-CM | POA: Diagnosis not present

## 2016-08-23 DIAGNOSIS — K219 Gastro-esophageal reflux disease without esophagitis: Secondary | ICD-10-CM | POA: Diagnosis not present

## 2016-08-23 DIAGNOSIS — R0602 Shortness of breath: Secondary | ICD-10-CM | POA: Diagnosis not present

## 2016-08-23 HISTORY — DX: Bipolar disorder, current episode mixed, unspecified: F31.60

## 2016-08-23 HISTORY — DX: Cerebral infarction, unspecified: I63.9

## 2016-08-23 LAB — COMPREHENSIVE METABOLIC PANEL
ALBUMIN: 3.6 g/dL (ref 3.5–5.0)
ALT: 18 U/L (ref 17–63)
ANION GAP: 11 (ref 5–15)
AST: 17 U/L (ref 15–41)
Alkaline Phosphatase: 86 U/L (ref 38–126)
BILIRUBIN TOTAL: 1.1 mg/dL (ref 0.3–1.2)
BUN: 19 mg/dL (ref 6–20)
CO2: 22 mmol/L (ref 22–32)
Calcium: 8.6 mg/dL — ABNORMAL LOW (ref 8.9–10.3)
Chloride: 99 mmol/L — ABNORMAL LOW (ref 101–111)
Creatinine, Ser: 1.04 mg/dL (ref 0.61–1.24)
GFR calc Af Amer: >60 mL/min (ref >60)
GFR calc non Af Amer: >60 mL/min (ref >60)
GLUCOSE: 251 mg/dL — AB (ref 65–99)
POTASSIUM: 3.9 mmol/L (ref 3.5–5.1)
SODIUM: 132 mmol/L — AB (ref 135–145)
Total Protein: 7.2 g/dL (ref 6.5–8.1)

## 2016-08-23 LAB — GLUCOSE, CAPILLARY: GLUCOSE-CAPILLARY: 190 mg/dL — AB (ref 65–99)

## 2016-08-23 LAB — URINALYSIS, ROUTINE W REFLEX MICROSCOPIC
BACTERIA UA: NONE SEEN
BILIRUBIN URINE: NEGATIVE
Glucose, UA: 50 mg/dL — AB
Hgb urine dipstick: NEGATIVE
KETONES UR: 5 mg/dL — AB
Leukocytes, UA: NEGATIVE
Nitrite: NEGATIVE
PH: 5 (ref 5.0–8.0)
PROTEIN: 30 mg/dL — AB
Specific Gravity, Urine: 1.036 — ABNORMAL HIGH (ref 1.005–1.030)

## 2016-08-23 LAB — CBG MONITORING, ED: GLUCOSE-CAPILLARY: 214 mg/dL — AB (ref 65–99)

## 2016-08-23 LAB — CBC WITH DIFFERENTIAL/PLATELET
Basophils Absolute: 0 10*3/uL (ref 0.0–0.1)
Basophils Relative: 0 %
EOS PCT: 0 %
Eosinophils Absolute: 0 10*3/uL (ref 0.0–0.7)
HEMATOCRIT: 38.1 % — AB (ref 39.0–52.0)
Hemoglobin: 13.1 g/dL (ref 13.0–17.0)
LYMPHS ABS: 0.5 10*3/uL — AB (ref 0.7–4.0)
LYMPHS PCT: 4 %
MCH: 31 pg (ref 26.0–34.0)
MCHC: 34.4 g/dL (ref 30.0–36.0)
MCV: 90.1 fL (ref 78.0–100.0)
MONO ABS: 0.9 10*3/uL (ref 0.1–1.0)
Monocytes Relative: 8 %
NEUTROS ABS: 10.5 10*3/uL — AB (ref 1.7–7.7)
NEUTROS PCT: 88 %
PLATELETS: 129 10*3/uL — AB (ref 150–400)
RBC: 4.23 MIL/uL (ref 4.22–5.81)
RDW: 14.1 % (ref 11.5–15.5)
WBC: 11.9 10*3/uL — ABNORMAL HIGH (ref 4.0–10.5)

## 2016-08-23 LAB — I-STAT CG4 LACTIC ACID, ED: Lactic Acid, Venous: 1.25 mmol/L (ref 0.5–1.9)

## 2016-08-23 LAB — INFLUENZA PANEL BY PCR (TYPE A & B)
Influenza A By PCR: NEGATIVE
Influenza B By PCR: NEGATIVE

## 2016-08-23 LAB — PROTIME-INR
INR: 1.22
Prothrombin Time: 15.5 seconds — ABNORMAL HIGH (ref 11.4–15.2)

## 2016-08-23 LAB — LACTIC ACID, PLASMA
Lactic Acid, Venous: 1.3 mmol/L (ref 0.5–1.9)
Lactic Acid, Venous: 0.9 mmol/L (ref 0.5–1.9)

## 2016-08-23 LAB — PROCALCITONIN: PROCALCITONIN: 1.04 ng/mL

## 2016-08-23 LAB — APTT: APTT: 30 s (ref 24–36)

## 2016-08-23 MED ORDER — ATORVASTATIN CALCIUM 40 MG PO TABS
40.0000 mg | ORAL_TABLET | Freq: Every day | ORAL | Status: DC
  Administered 2016-08-23 – 2016-08-24 (×2): 40 mg via ORAL
  Filled 2016-08-23 (×2): qty 1

## 2016-08-23 MED ORDER — INSULIN ASPART 100 UNIT/ML ~~LOC~~ SOLN
0.0000 [IU] | Freq: Three times a day (TID) | SUBCUTANEOUS | Status: DC
  Administered 2016-08-24: 3 [IU] via SUBCUTANEOUS
  Administered 2016-08-24 (×2): 5 [IU] via SUBCUTANEOUS
  Administered 2016-08-25: 2 [IU] via SUBCUTANEOUS
  Administered 2016-08-25 – 2016-08-26 (×2): 3 [IU] via SUBCUTANEOUS
  Administered 2016-08-26: 8 [IU] via SUBCUTANEOUS
  Administered 2016-08-26: 5 [IU] via SUBCUTANEOUS
  Administered 2016-08-27: 3 [IU] via SUBCUTANEOUS
  Administered 2016-08-27: 8 [IU] via SUBCUTANEOUS
  Administered 2016-08-27 – 2016-08-29 (×6): 3 [IU] via SUBCUTANEOUS
  Administered 2016-08-29: 5 [IU] via SUBCUTANEOUS
  Administered 2016-08-30 (×3): 3 [IU] via SUBCUTANEOUS
  Administered 2016-08-31: 2 [IU] via SUBCUTANEOUS

## 2016-08-23 MED ORDER — PIPERACILLIN-TAZOBACTAM 3.375 G IVPB
3.3750 g | Freq: Three times a day (TID) | INTRAVENOUS | Status: DC
  Administered 2016-08-23 – 2016-08-31 (×22): 3.375 g via INTRAVENOUS
  Filled 2016-08-23 (×23): qty 50

## 2016-08-23 MED ORDER — ONDANSETRON HCL 4 MG/2ML IJ SOLN
4.0000 mg | Freq: Once | INTRAMUSCULAR | Status: AC
  Administered 2016-08-23: 4 mg via INTRAVENOUS
  Filled 2016-08-23: qty 2

## 2016-08-23 MED ORDER — ACETAMINOPHEN 325 MG PO TABS: 650.0000 mg | ORAL_TABLET | Freq: Four times a day (QID) | ORAL | Status: DC | PRN

## 2016-08-23 MED ORDER — MORPHINE SULFATE (PF) 2 MG/ML IV SOLN
2.0000 mg | INTRAVENOUS | Status: DC | PRN
  Administered 2016-08-23 – 2016-08-25 (×6): 2 mg via INTRAVENOUS
  Filled 2016-08-23 (×6): qty 1

## 2016-08-23 MED ORDER — METOPROLOL TARTRATE 5 MG/5ML IV SOLN
5.0000 mg | Freq: Two times a day (BID) | INTRAVENOUS | Status: DC
  Administered 2016-08-23 – 2016-08-26 (×5): 5 mg via INTRAVENOUS
  Filled 2016-08-23 (×6): qty 5

## 2016-08-23 MED ORDER — ONDANSETRON HCL 4 MG PO TABS
4.0000 mg | ORAL_TABLET | Freq: Four times a day (QID) | ORAL | Status: DC | PRN
  Administered 2016-08-28: 4 mg via ORAL
  Filled 2016-08-23 (×2): qty 1

## 2016-08-23 MED ORDER — LACTATED RINGERS IV SOLN
INTRAVENOUS | Status: DC
  Administered 2016-08-23 – 2016-08-25 (×4): via INTRAVENOUS

## 2016-08-23 MED ORDER — IOPAMIDOL (ISOVUE-300) INJECTION 61%
100.0000 mL | Freq: Once | INTRAVENOUS | Status: AC | PRN
  Administered 2016-08-23: 100 mL via INTRAVENOUS

## 2016-08-23 MED ORDER — ACETAMINOPHEN 325 MG PO TABS
650.0000 mg | ORAL_TABLET | Freq: Once | ORAL | Status: AC
  Administered 2016-08-23: 650 mg via ORAL
  Filled 2016-08-23: qty 2

## 2016-08-23 MED ORDER — SODIUM CHLORIDE 0.9 % IV BOLUS (SEPSIS)
1000.0000 mL | Freq: Once | INTRAVENOUS | Status: AC
  Administered 2016-08-23: 1000 mL via INTRAVENOUS

## 2016-08-23 MED ORDER — FLUOXETINE HCL 20 MG PO CAPS
20.0000 mg | ORAL_CAPSULE | Freq: Every day | ORAL | Status: DC
  Administered 2016-08-25 – 2016-08-31 (×7): 20 mg via ORAL
  Filled 2016-08-23 (×7): qty 1

## 2016-08-23 MED ORDER — LORAZEPAM 2 MG/ML IJ SOLN
1.0000 mg | Freq: Four times a day (QID) | INTRAMUSCULAR | Status: DC | PRN
  Administered 2016-08-23 – 2016-08-27 (×2): 1 mg via INTRAVENOUS
  Filled 2016-08-23 (×2): qty 1

## 2016-08-23 MED ORDER — LAMOTRIGINE 100 MG PO TABS
400.0000 mg | ORAL_TABLET | Freq: Every day | ORAL | Status: DC
  Administered 2016-08-23 – 2016-08-30 (×8): 400 mg via ORAL
  Filled 2016-08-23 (×8): qty 4

## 2016-08-23 MED ORDER — LOSARTAN POTASSIUM 50 MG PO TABS
100.0000 mg | ORAL_TABLET | Freq: Every day | ORAL | Status: DC
  Filled 2016-08-23: qty 2

## 2016-08-23 MED ORDER — TAMSULOSIN HCL 0.4 MG PO CAPS
0.4000 mg | ORAL_CAPSULE | Freq: Two times a day (BID) | ORAL | Status: DC
  Administered 2016-08-24 – 2016-08-31 (×14): 0.4 mg via ORAL
  Filled 2016-08-23 (×15): qty 1

## 2016-08-23 MED ORDER — ONDANSETRON HCL 4 MG/2ML IJ SOLN
4.0000 mg | Freq: Four times a day (QID) | INTRAMUSCULAR | Status: DC | PRN
  Administered 2016-08-24 – 2016-08-31 (×6): 4 mg via INTRAVENOUS
  Filled 2016-08-23 (×6): qty 2

## 2016-08-23 MED ORDER — ACETAMINOPHEN 650 MG RE SUPP: 650.0000 mg | Freq: Four times a day (QID) | RECTAL | Status: DC | PRN

## 2016-08-23 MED ORDER — METOPROLOL TARTRATE 25 MG PO TABS: 25.0000 mg | ORAL_TABLET | Freq: Two times a day (BID) | ORAL | Status: DC

## 2016-08-23 MED ORDER — PIPERACILLIN-TAZOBACTAM 3.375 G IVPB 30 MIN
3.3750 g | Freq: Once | INTRAVENOUS | Status: AC
  Administered 2016-08-23: 3.375 g via INTRAVENOUS
  Filled 2016-08-23: qty 50

## 2016-08-23 MED ORDER — ACETAMINOPHEN 325 MG PO TABS
ORAL_TABLET | ORAL | Status: AC
  Administered 2016-08-23: 650 mg
  Filled 2016-08-23: qty 2

## 2016-08-23 NOTE — ED Triage Notes (Signed)
PT c/o fever, nausea, generalized body aches, and generalized weakness x2 days.

## 2016-08-23 NOTE — ED Notes (Signed)
Patient requesting medication for nausea. Verbal order for zofran 4mg  IV to be given.

## 2016-08-23 NOTE — Consult Note (Signed)
Reason for Consult: Cholecystitis Referring Physician: Dr. Aldean Ast is an 61 y.o. male.  HPI: Patient is a 61 year old white male who presents with a 2 day history of worsening right upper quadrant abdominal pain. He states that as an aching pain. He does have fever, decreased appetite, nausea. He denies emesis. He denies any diarrhea or constipation. He states he has had intermittent episodes of fatty food intolerance for years. He denies any chills or jaundice. CT scan of the abdomen revealed cholecystitis with cholelithiasis. There is also question of a dilated common bile duct. Surgery consultation has been requested.  Past Medical History:  Diagnosis Date  . Acid reflux disease   . Anxiety and depression   . BPH (benign prostatic hypertrophy)   . CVA (cerebral vascular accident) ([place]) 07/2015  . Gastritis   . H/O hiatal hernia   . Hyperlipidemia   . Hypertension   . Normal nuclear stress test Dec 2011  . Obesity   . TIA (transient ischemic attack) 05/15/2012   "they think I've had one this am @ 0130" (05/15/2012)  . Type II diabetes mellitus ([place])   . Vertigo     Past Surgical History:  Procedure Laterality Date  . CARDIOVASCULAR STRESS TEST  2009   EF 63%  . NO PAST SURGERIES      Family History  Problem Relation Age of Onset  . Hyperlipidemia Father     in a nursing home  . Colon cancer Father   . Stroke Mother 1    Social History:  reports that he has never smoked. He has never used smokeless tobacco. He reports that he does not drink alcohol or use drugs.  Allergies: No Known Allergies  Medications: Prior to Admission:  (Not in a hospital admission) Scheduled:  Results for orders placed or performed during the hospital encounter of 08/23/16 (from the past 48 hour(s))  Urinalysis, Routine w reflex microscopic     Status: Abnormal   Collection Time: 08/23/16 12:48 PM  Result Value Ref Range   Color, Urine YELLOW YELLOW   APPearance HAZY (A) CLEAR    Specific Gravity, Urine 1.036 (H) 1.005 - 1.030   pH 5.0 5.0 - 8.0   Glucose, UA 50 (A) NEGATIVE mg/dL   Hgb urine dipstick NEGATIVE NEGATIVE   Bilirubin Urine NEGATIVE NEGATIVE   Ketones, ur 5 (A) NEGATIVE mg/dL   Protein, ur 30 (A) NEGATIVE mg/dL   Nitrite NEGATIVE NEGATIVE   Leukocytes, UA NEGATIVE NEGATIVE   RBC / HPF 0-5 0 - 5 RBC/hpf   WBC, UA 0-5 0 - 5 WBC/hpf   Bacteria, UA NONE SEEN NONE SEEN   Squamous Epithelial / LPF 0-5 (A) NONE SEEN   Mucous PRESENT   Comprehensive metabolic panel     Status: Abnormal   Collection Time: 08/23/16 12:57 PM  Result Value Ref Range   Sodium 132 (L) 135 - 145 mmol/L   Potassium 3.9 3.5 - 5.1 mmol/L   Chloride 99 (L) 101 - 111 mmol/L   CO2 22 22 - 32 mmol/L   Glucose, Bld 251 (H) 65 - 99 mg/dL   BUN 19 6 - 20 mg/dL   Creatinine, Ser 1.04 0.61 - 1.24 mg/dL   Calcium 8.6 (L) 8.9 - 10.3 mg/dL   Total Protein 7.2 6.5 - 8.1 g/dL   Albumin 3.6 3.5 - 5.0 g/dL   AST 17 15 - 41 U/L   ALT 18 17 - 63 U/L   Alkaline Phosphatase 86  38 - 126 U/L   Total Bilirubin 1.1 0.3 - 1.2 mg/dL   GFR calc non Af Amer >60 >60 mL/min   GFR calc Af Amer >60 >60 mL/min    Comment: (NOTE) The eGFR has been calculated using the CKD EPI equation. This calculation has not been validated in all clinical situations. eGFR's persistently <60 mL/min signify possible Chronic Kidney Disease.    Anion gap 11 5 - 15  CBC with Differential     Status: Abnormal   Collection Time: 08/23/16 12:57 PM  Result Value Ref Range   WBC 11.9 (H) 4.0 - 10.5 K/uL   RBC 4.23 4.22 - 5.81 MIL/uL   Hemoglobin 13.1 13.0 - 17.0 g/dL   HCT 38.1 (L) 39.0 - 52.0 %   MCV 90.1 78.0 - 100.0 fL   MCH 31.0 26.0 - 34.0 pg   MCHC 34.4 30.0 - 36.0 g/dL   RDW 14.1 11.5 - 15.5 %   Platelets 129 (L) 150 - 400 K/uL   Neutrophils Relative % 88 %   Neutro Abs 10.5 (H) 1.7 - 7.7 K/uL   Lymphocytes Relative 4 %   Lymphs Abs 0.5 (L) 0.7 - 4.0 K/uL   Monocytes Relative 8 %   Monocytes Absolute 0.9  0.1 - 1.0 K/uL   Eosinophils Relative 0 %   Eosinophils Absolute 0.0 0.0 - 0.7 K/uL   Basophils Relative 0 %   Basophils Absolute 0.0 0.0 - 0.1 K/uL  POC CBG, ED     Status: Abnormal   Collection Time: 08/23/16  1:01 PM  Result Value Ref Range   Glucose-Capillary 214 (H) 65 - 99 mg/dL   Comment 1 Notify RN    Comment 2 Document in Chart   Influenza panel by PCR (type A & B)     Status: None   Collection Time: 08/23/16  1:11 PM  Result Value Ref Range   Influenza A By PCR NEGATIVE NEGATIVE   Influenza B By PCR NEGATIVE NEGATIVE    Comment: (NOTE) The Xpert Xpress Flu assay is intended as an aid in the diagnosis of  influenza and should not be used as a sole basis for treatment.  This  assay is FDA approved for nasopharyngeal swab specimens only. Nasal  washings and aspirates are unacceptable for Xpert Xpress Flu testing.   I-Stat CG4 Lactic Acid, ED     Status: None   Collection Time: 08/23/16  1:29 PM  Result Value Ref Range   Lactic Acid, Venous 1.25 0.5 - 1.9 mmol/L    Dg Chest 2 View  Result Date: 08/23/2016 CLINICAL DATA:  Tightness in soreness in chest and abdomen, shortness of breath for 3 days, history hypertension, diabetes mellitus EXAM: CHEST  2 VIEW COMPARISON:  08/04/2015 FINDINGS: Mild enlargement of cardiac silhouette. Mediastinal contours and pulmonary vascularity normal. Lungs clear. No pleural effusion or pneumothorax. Bones unremarkable. IMPRESSION: Mild enlargement of cardiac silhouette. No acute abnormalities. Electronically Signed   By: Lavonia Dana M.D.   On: 08/23/2016 14:42   Ct Abdomen Pelvis W Contrast  Result Date: 08/23/2016 CLINICAL DATA:  Fever, nausea, and weakness for 2 days. EXAM: CT ABDOMEN AND PELVIS WITH CONTRAST TECHNIQUE: Multidetector CT imaging of the abdomen and pelvis was performed using the standard protocol following bolus administration of intravenous contrast. CONTRAST:  194m ISOVUE-300 IOPAMIDOL (ISOVUE-300) INJECTION 61% COMPARISON:   08/21/2012 FINDINGS: Lower chest: The visualized lung bases are clear. Hepatobiliary: No focal liver abnormality is seen. The gallbladder is moderately distended with  new wall thickening and pericholecystic inflammatory change. A punctate calculus is noted dependently in the gallbladder. There is mild common bile duct dilatation up to 10 mm in diameter without an obstructing stone or mass identified. Pancreas: No mass, inflammatory change, or ductal dilatation. Spleen: Unremarkable. Adrenals/Urinary Tract: Unremarkable adrenal glands. 17 mm left lower pole renal cyst has enlarged. Subcentimeter left renal hypodensities are too small to fully characterize. There is a 5 mm stone in the left renal pelvis which is new but is without associated hydronephrosis. The bladder is unremarkable. Stomach/Bowel: The stomach is within normal limits. There is no evidence of bowel obstruction or inflammation. The appendix is unremarkable. Vascular/Lymphatic: Mild abdominal aortic atherosclerosis without aneurysm. 11 mm porta hepatis lymph node, likely reactive. Reproductive: Unremarkable prostate. Other: No intraperitoneal free fluid. Small fat containing paraumbilical hernia. Musculoskeletal: Mild thoracolumbar spondylosis. Moderate disc space narrowing at L4-5 with vacuum disc. IMPRESSION: 1. Gallbladder wall thickening and pericholecystic inflammation concerning for acute cholecystitis. Punctate calcified stone in the gallbladder. 2. Mild common bile duct dilatation without definite stone identified. 3. Nonobstructing left renal calculus. 4.  Aortic Atherosclerosis (ICD10-I70.0). Electronically Signed   By: Logan Bores M.D.   On: 08/23/2016 15:42    ROS:  Pertinent items noted in HPI and remainder of comprehensive ROS otherwise negative.  Blood pressure 134/86, pulse (!) 119, temperature (!) 101.5 F (38.6 C), temperature source Rectal, resp. rate (!) 23, height '5\' 9"'  (1.753 m), weight 220 lb (99.8 kg), SpO2 97  %. Physical Exam: Pleasant well-developed well-nourished white male in no acute distress. Head is normocephalic, atraumatic Eyes without scleral icterus. Neck is supple without lymphadenopathy. Lungs clear auscultation with breath sounds bilaterally. Heart examination reveals a regular rate and rhythm, though tachycardic. Abdomen is rotund with tenderness noted to palpation in the right upper quadrant. Could not appreciate hepatosplenomegaly. No rigidity is noted.  CT scan report personally reviewed.  Assessment/Plan: Impression: Acute cholecystitis, cholelithiasis, hypertension Plan: Patient to be admitted to the hospital for control his pain, nausea, and his blood pressure. He will need ultrasound of the right upper quadrant tomorrow due to his dilated common bile duct. I would start antibiotic, preferably Zosyn. Liver enzyme tests are all within normal limits. He will subsequently require laparoscopic cholecystectomy. Patient seemed hesitant on this idea, I told him we would discuss it again tomorrow pending the ultrasound report.  [name] 08/23/2016, 5:49 PM

## 2016-08-23 NOTE — ED Notes (Signed)
Dr. Jenkins at bedside. 

## 2016-08-23 NOTE — H&P (Signed)
History and Physical    Robert Walters EPP:[id number] DOB: 12-01-1955 DOA: 08/23/2016  PCP: Maximino Greenland, MD Consultants:  Urology - Zettie Cooley; GI in [place]; [facility] - cardiology; Leda.Spearman - neurology Patient coming from: home - lives alone; NOK: brother  Chief Complaint: fever  HPI: Robert Walters is a 61 y.o. male with medical history significant of DM, CVA in 3/17, HTN, HLD, GERD, and anxiety/depression presenting with fever and abdominal pain.  Sunday, he ate a bit of dinner at noon but didn't feel well Saturday and did not eat.  He has not eaten anything since yesterday noon - but has been drinking water.  He noticed symptoms on Friday, TTP in RUQ.  Difficulty to lie flat "because it would kind of choke me down a little bit."  Fever started last night, 100.5.  +chills/sweats.  +nausea, no vomiting (even after he tried to force himself to vomit).  No diarrhea, no BMs in the last 2 days.  Has not noticed any jaundice.  Mild heartburn, worse with lying down..   ED Course:  CT c/w acute cholecystitis, given Zosyn, IVF, and Dr. Arnoldo Morale consult  Review of Systems: As per HPI; otherwise review of systems reviewed and negative.   Ambulatory Status:  ambulates without assistance.  Past Medical History:  Diagnosis Date  . Acid reflux disease   . Bipolar 1 disorder, mixed ([place])   . BPH (benign prostatic hypertrophy)   . CVA (cerebral vascular accident) ([place]) 07/2015  . Gastritis   . H/O hiatal hernia   . Hyperlipidemia   . Hypertension   . Normal nuclear stress test Dec 2011  . Obesity   . TIA (transient ischemic attack) 05/15/2012   "they think I've had one this am @ 0130" (05/15/2012)  . Type II diabetes mellitus ([place])   . Vertigo     Past Surgical History:  Procedure Laterality Date  . CARDIOVASCULAR STRESS TEST  2009   EF 63%  . NO PAST SURGERIES      Social History   Social History  . Marital status: Widowed    Spouse name: N/A  . Number of children: N/A  . Years of  education: N/A   Occupational History  . disabled    Social History Main Topics  . Smoking status: Never Smoker  . Smokeless tobacco: Never Used  . Alcohol use No  . Drug use: No  . Sexual activity: No   Other Topics Concern  . Not on file   Social History Narrative  . No narrative on file    No Known Allergies  Family History  Problem Relation Age of Onset  . Hyperlipidemia Father     in a nursing home  . Colon cancer Father   . Stroke Mother 48    Prior to Admission medications   Medication Sig Start Date End Date Taking? Authorizing Provider  aspirin 325 MG tablet Take 1 tablet (325 mg total) by mouth daily. 07/27/15  Yes Kathie Dike, MD  atorvastatin (LIPITOR) 40 MG tablet Take 1 tablet (40 mg total) by mouth daily at 6 PM. 07/27/15  Yes Kathie Dike, MD  Cholecalciferol (VITAMIN D) 2000 UNITS CAPS Take 1 capsule by mouth daily.   Yes Historical Provider, MD  FLUoxetine (PROZAC) 20 MG capsule Take 20 mg by mouth daily.   Yes Historical Provider, MD  glipiZIDE (GLUCOTROL) 10 MG tablet Take 10 mg by mouth daily.    Yes Historical Provider, MD  lamoTRIgine (LAMICTAL) 200 MG tablet  Take 400 mg by mouth at bedtime.    Yes Historical Provider, MD  LORazepam (ATIVAN) 1 MG tablet Take 1 mg by mouth every 8 (eight) hours.   Yes Historical Provider, MD  losartan (COZAAR) 100 MG tablet Take 1 tablet (100 mg total) by mouth daily. 03/22/16  Yes Thayer Headings, MD  metFORMIN (GLUCOPHAGE) 1000 MG tablet Take 1,000 mg by mouth 2 (two) times daily with a meal.     Yes Historical Provider, MD  metoCLOPramide (REGLAN) 5 MG tablet Take 5 mg by mouth every morning.    Yes Historical Provider, MD  metoprolol tartrate (LOPRESSOR) 25 MG tablet Take 1 tablet (25 mg total) by mouth 2 (two) times daily. 01/22/16 08/23/16 Yes Thayer Headings, MD  Tamsulosin HCl (FLOMAX) 0.4 MG CAPS Take 0.4 mg by mouth 2 (two) times daily.   Yes Historical Provider, MD  zolpidem (AMBIEN) 10 MG tablet Take 1  tablet (10 mg total) by mouth at bedtime as needed for sleep. Patient taking differently: Take 10 mg by mouth at bedtime.  08/15/12  Yes Leonard Schwartz, MD    Physical Exam: Vitals:   08/23/16 1658 08/23/16 1700 08/23/16 1753 08/23/16 1800  BP: 134/86 (!) 149/96  (!) 148/99  Pulse: (!) 119 (!) 117  (!) 106  Resp: (!) 23 20  (!) 21  Temp:   (!) 101.6 F (38.7 C)   TempSrc:   Rectal   SpO2: 97% 97%  94%  Weight:      Height:         General:  Appears calm and comfortable and is NAD Eyes:  PERRL, EOMI, normal lids, iris ENT:  grossly normal hearing, lips & tongue, mmm Neck:  no LAD, masses or thyromegaly Cardiovascular:  RRR, no m/r/g. No LE edema.  Respiratory:  CTA bilaterally, no w/r/r. Normal respiratory effort. Abdomen:  soft, mild R-sided upper abdominal distention, +TTP in midepigastric region and RUQ, hypoactive BS Skin:  no rash or induration seen on limited exam Musculoskeletal:  grossly normal tone BUE/BLE, good ROM, no bony abnormality Psychiatric:  grossly normal mood and affect, mildly anxious, speech fluent and appropriate, AOx3 Neurologic:  CN 2-12 grossly intact, moves all extremities in coordinated fashion, sensation intact  Labs on Admission: I have personally reviewed following labs and imaging studies  CBC:  Recent Labs Lab 08/23/16 1257  WBC 11.9*  NEUTROABS 10.5*  HGB 13.1  HCT 38.1*  MCV 90.1  PLT 626*   Basic Metabolic Panel:  Recent Labs Lab 08/23/16 1257  NA 132*  K 3.9  CL 99*  CO2 22  GLUCOSE 251*  BUN 19  CREATININE 1.04  CALCIUM 8.6*   GFR: Estimated Creatinine Clearance: 85.7 mL/min (by C-G formula based on SCr of 1.04 mg/dL). Liver Function Tests:  Recent Labs Lab 08/23/16 1257  AST 17  ALT 18  ALKPHOS 86  BILITOT 1.1  PROT 7.2  ALBUMIN 3.6   No results for input(s): LIPASE, AMYLASE in the last 168 hours. No results for input(s): AMMONIA in the last 168 hours. Coagulation Profile: No results for input(s): INR,  PROTIME in the last 168 hours. Cardiac Enzymes: No results for input(s): CKTOTAL, CKMB, CKMBINDEX, TROPONINI in the last 168 hours. BNP (last 3 results) No results for input(s): PROBNP in the last 8760 hours. HbA1C: No results for input(s): HGBA1C in the last 72 hours. CBG:  Recent Labs Lab 08/23/16 1301  GLUCAP 214*   Lipid Profile: No results for input(s): CHOL, HDL, LDLCALC, TRIG,  CHOLHDL, LDLDIRECT in the last 72 hours. Thyroid Function Tests: No results for input(s): TSH, T4TOTAL, FREET4, T3FREE, THYROIDAB in the last 72 hours. Anemia Panel: No results for input(s): VITAMINB12, FOLATE, FERRITIN, TIBC, IRON, RETICCTPCT in the last 72 hours. Urine analysis:    Component Value Date/Time   COLORURINE YELLOW 08/23/2016 1248   APPEARANCEUR HAZY (A) 08/23/2016 1248   LABSPEC 1.036 (H) 08/23/2016 1248   PHURINE 5.0 08/23/2016 1248   GLUCOSEU 50 (A) 08/23/2016 1248   HGBUR NEGATIVE 08/23/2016 1248   BILIRUBINUR NEGATIVE 08/23/2016 1248   KETONESUR 5 (A) 08/23/2016 1248   PROTEINUR 30 (A) 08/23/2016 1248   UROBILINOGEN 0.2 08/09/2014 2022   NITRITE NEGATIVE 08/23/2016 1248   LEUKOCYTESUR NEGATIVE 08/23/2016 1248    Creatinine Clearance: Estimated Creatinine Clearance: 85.7 mL/min (by C-G formula based on SCr of 1.04 mg/dL).  Sepsis Labs: @LABRCNTIP (procalcitonin:4,lacticidven:4) )No results found for this or any previous visit (from the past 240 hour(s)).   Radiological Exams on Admission: Dg Chest 2 View  Result Date: 08/23/2016 CLINICAL DATA:  Tightness in soreness in chest and abdomen, shortness of breath for 3 days, history hypertension, diabetes mellitus EXAM: CHEST  2 VIEW COMPARISON:  08/04/2015 FINDINGS: Mild enlargement of cardiac silhouette. Mediastinal contours and pulmonary vascularity normal. Lungs clear. No pleural effusion or pneumothorax. Bones unremarkable. IMPRESSION: Mild enlargement of cardiac silhouette. No acute abnormalities. Electronically Signed    By: Lavonia Dana M.D.   On: 08/23/2016 14:42   Ct Abdomen Pelvis W Contrast  Result Date: 08/23/2016 CLINICAL DATA:  Fever, nausea, and weakness for 2 days. EXAM: CT ABDOMEN AND PELVIS WITH CONTRAST TECHNIQUE: Multidetector CT imaging of the abdomen and pelvis was performed using the standard protocol following bolus administration of intravenous contrast. CONTRAST:  128mL ISOVUE-300 IOPAMIDOL (ISOVUE-300) INJECTION 61% COMPARISON:  08/21/2012 FINDINGS: Lower chest: The visualized lung bases are clear. Hepatobiliary: No focal liver abnormality is seen. The gallbladder is moderately distended with new wall thickening and pericholecystic inflammatory change. A punctate calculus is noted dependently in the gallbladder. There is mild common bile duct dilatation up to 10 mm in diameter without an obstructing stone or mass identified. Pancreas: No mass, inflammatory change, or ductal dilatation. Spleen: Unremarkable. Adrenals/Urinary Tract: Unremarkable adrenal glands. 17 mm left lower pole renal cyst has enlarged. Subcentimeter left renal hypodensities are too small to fully characterize. There is a 5 mm stone in the left renal pelvis which is new but is without associated hydronephrosis. The bladder is unremarkable. Stomach/Bowel: The stomach is within normal limits. There is no evidence of bowel obstruction or inflammation. The appendix is unremarkable. Vascular/Lymphatic: Mild abdominal aortic atherosclerosis without aneurysm. 11 mm porta hepatis lymph node, likely reactive. Reproductive: Unremarkable prostate. Other: No intraperitoneal free fluid. Small fat containing paraumbilical hernia. Musculoskeletal: Mild thoracolumbar spondylosis. Moderate disc space narrowing at L4-5 with vacuum disc. IMPRESSION: 1. Gallbladder wall thickening and pericholecystic inflammation concerning for acute cholecystitis. Punctate calcified stone in the gallbladder. 2. Mild common bile duct dilatation without definite stone  identified. 3. Nonobstructing left renal calculus. 4.  Aortic Atherosclerosis (ICD10-I70.0). Electronically Signed   By: Logan Bores M.D.   On: 08/23/2016 15:42    EKG: not done  Assessment/Plan Principal Problem:   Sepsis ([place]) Active Problems:   HTN (hypertension)   Diabetes mellitus type 2 with complications (HCC)   Bipolar 1 disorder (HCC)   Cholecystitis   Thrombocytopenia ([facility])   Sepsis from acute cholecystitis -Elevated WBC count (11.9), fever, tachycardia with normal lactate  -While awaiting blood  cultures, this appears to be a preseptic condition. -Sepsis protocol initiated -Appears to have acute cholecystitis, but there is possible mild ductal dilation and so RUQ is ordered.  Additionally, since sepsis physiology is somewhat unusual with cholecystitis, ascending cholangitis must be considered in the differential (despite normal bilirubin/lack of jaundice) and so surgery consult was requested tonight (already seen by Dr. Arnoldo Morale) -Blood and urine cultures pending -Influenza negative -Will admit and continue to monitor -Treat with IV Zosyn -Will trend lactate and check procalcitonin  HTN -Continue Cozaar -Hold Lopressor and cover with IV Lopressor  DM -Glucose 251, 214 -Hold Glucotrol and Glucophage -Cover with SSI -Check A1c (6.3 in 3/17)  Bipolar -Continue Prozac, Lamictal -Hold Xanax and give IV Ativan as needed  Thrombocytopenia -Platelets 129, chronic and stable    DVT prophylaxis: SCDs Code Status: Full - confirmed with patient Family Communication: None present Disposition Plan:  Home once clinically improved Consults called: Surgery  Admission status: Admit - It is my clinical opinion that admission to INPATIENT is reasonable and necessary because this patient will require at least 2 midnights in the hospital to treat this condition based on the medical complexity of the problems presented.  Given the aforementioned information, the predictability of  an adverse outcome is felt to be significant.    Karmen Bongo MD [facility]  If 7PM-7AM, please contact night-coverage [email or web address] Password [facility]  08/23/2016, 6:25 PM

## 2016-08-23 NOTE — Progress Notes (Signed)
Pharmacy Antibiotic Note  Robert Walters is a 61 y.o. male admitted on 08/23/2016 with Intra-abdominal Infection.  Pharmacy has been consulted for Zosyn  dosing.  Plan: Zosyn 3.375gm IV every 8 hours. Follow-up micro data, labs, vitals.  Height: 5\' 9"  (175.3 cm) Weight: 220 lb (99.8 kg) IBW/kg (Calculated) : 70.7  Temp (24hrs), Avg:100.2 F (37.9 C), Min:98.2 F (36.8 C), Max:101.6 F (38.7 C)   Recent Labs Lab 08/23/16 1257 08/23/16 1329  WBC 11.9*  --   CREATININE 1.04  --   LATICACIDVEN  --  1.25    Estimated Creatinine Clearance: 85.7 mL/min (by C-G formula based on SCr of 1.04 mg/dL).    No Known Allergies  Antimicrobials this admission:  Zosyn 4/23 >>   Dose adjustments this admission:  n/a   Microbiology results:  4/23 BCx: pending 4/23 UCx: pending   4/23 HIV: pending  Thank you for allowing pharmacy to be a part of this patient's care.  Pricilla Larsson 08/23/2016 6:56 PM

## 2016-08-23 NOTE — ED Provider Notes (Signed)
[place] DEPT Provider Note   CSN: [id number] Arrival date & time: 08/23/16  1239     History   Chief Complaint Chief Complaint  Patient presents with  . Fever    HPI Robert Walters is a 61 y.o. male.  The history is provided by the patient.  Abdominal Pain   This is a new problem. The current episode started more than 2 days ago. The problem occurs constantly. The problem has been gradually worsening. The pain is located in the generalized abdominal region. The quality of the pain is pressure-like and aching. The pain is moderate. Associated symptoms include anorexia, fever and nausea. Pertinent negatives include belching, diarrhea, melena, vomiting and dysuria. Nothing aggravates the symptoms. Nothing relieves the symptoms.    Past Medical History:  Diagnosis Date  . Acid reflux disease   . BPH (benign prostatic hypertrophy)   . Gastritis   . H/O hiatal hernia   . Hyperlipidemia   . Hypertension   . Normal nuclear stress test Dec 2011  . Obesity   . TIA (transient ischemic attack) 05/15/2012   "they think I've had one this am @ 0130" (05/15/2012)  . Type II diabetes mellitus ([place])   . Vertigo     Patient Active Problem List   Diagnosis Date Noted  . Stroke ([place]) 07/25/2015  . 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 Dizziness 08/09/2014  . TIA (transient ischemic attack) 05/15/2012  . Blurred vision 05/15/2012  . Ataxia 05/15/2012  . Diabetes mellitus type 2 with complications ([place]) 77/82/4235  . BPH (benign prostatic hyperplasia) 05/15/2012  . GERD (gastroesophageal reflux disease) 05/15/2012  . Obesity 05/15/2012  . Bipolar 1 disorder ([place]) 05/15/2012  . HTN (hypertension) 10/30/2010  . Cardiovascular risk factor 10/30/2010  . Pure hypercholesterolemia 12/05/2008    Past Surgical History:  Procedure Laterality Date  . CARDIOVASCULAR STRESS TEST  2009   EF 63%  . NO PAST SURGERIES         Home Medications    Prior to Admission medications     Medication Sig Start Date End Date Taking? Authorizing Provider  aspirin 325 MG tablet Take 1 tablet (325 mg total) by mouth daily. 07/27/15   Kathie Dike, MD  atorvastatin (LIPITOR) 40 MG tablet Take 1 tablet (40 mg total) by mouth daily at 6 PM. 07/27/15   Kathie Dike, MD  carbamazepine (EPITOL) 200 MG tablet Take 200 mg by mouth 2 (two) times daily.    Historical Provider, MD  Cholecalciferol (VITAMIN D) 2000 UNITS CAPS Take 1 capsule by mouth daily.    Historical Provider, MD  FLUoxetine (PROZAC) 20 MG capsule Take 20 mg by mouth daily.    Historical Provider, MD  glipiZIDE (GLUCOTROL) 10 MG tablet Take 10 mg by mouth daily.     Historical Provider, MD  lamoTRIgine (LAMICTAL) 200 MG tablet Take 400 mg by mouth at bedtime.     Historical Provider, MD  LORazepam (ATIVAN) 1 MG tablet Take 1 mg by mouth every 8 (eight) hours.    Historical Provider, MD  losartan (COZAAR) 100 MG tablet Take 1 tablet (100 mg total) by mouth daily. 03/22/16   Thayer Headings, MD  metFORMIN (GLUCOPHAGE) 1000 MG tablet Take 1,000 mg by mouth 2 (two) times daily with a meal.      Historical Provider, MD  metoCLOPramide (REGLAN) 5 MG tablet Take 5 mg by mouth every morning.     Historical Provider, MD  metoprolol tartrate (LOPRESSOR) 25 MG tablet  Take 1 tablet (25 mg total) by mouth 2 (two) times daily. 01/22/16 04/21/16  Thayer Headings, MD  ondansetron (ZOFRAN ODT) 4 MG disintegrating tablet 4mg  ODT q4 hours prn nausea/vomit 08/01/15   Milton Ferguson, MD  pantoprazole (PROTONIX) 40 MG tablet Take 40 mg by mouth every morning.     Historical Provider, MD  Tamsulosin HCl (FLOMAX) 0.4 MG CAPS Take 0.4 mg by mouth 2 (two) times daily.    Historical Provider, MD  zolpidem (AMBIEN) 10 MG tablet Take 1 tablet (10 mg total) by mouth at bedtime as needed for sleep. Patient taking differently: Take 10 mg by mouth at bedtime.  08/15/12   Leonard Schwartz, MD    Family History Family History  Problem Relation Age of Onset  .  Hyperlipidemia Father   . Colon cancer Father   . Stroke Mother     Social History Social History  Substance Use Topics  . Smoking status: Never Smoker  . Smokeless tobacco: Never Used  . Alcohol use No     Allergies   Patient has no known allergies.   Review of Systems Review of Systems  Constitutional: Positive for fever.  Respiratory: Positive for cough and shortness of breath (2/2 abdominal swellilng).   Gastrointestinal: Positive for abdominal pain, anorexia and nausea. Negative for diarrhea, melena and vomiting.  Genitourinary: Negative for dysuria.  All other systems reviewed and are negative.    Physical Exam Updated Vital Signs BP (!) 151/103 (BP Location: Right Arm)   Pulse (!) 128   Temp 99.7 F (37.6 C) (Axillary)   Resp (!) 22   Ht 5\' 9"  (1.753 m)   Wt 220 lb (99.8 kg)   SpO2 99%   BMI 32.49 kg/m   Physical Exam  Constitutional: He is oriented to person, place, and time. He appears well-developed and well-nourished.  HENT:  Head: Normocephalic and atraumatic.  Eyes: Conjunctivae and EOM are normal.  Neck: Normal range of motion.  Cardiovascular: Tachycardia present.   Pulmonary/Chest: Effort normal. No accessory muscle usage. Tachypnea noted. No respiratory distress.  Abdominal: Soft. He exhibits distension. There is tenderness.  Musculoskeletal: Normal range of motion. He exhibits no edema or deformity.  Neurological: He is alert and oriented to person, place, and time. No cranial nerve deficit.  Skin: Skin is warm and dry.  Nursing note and vitals reviewed.    ED Treatments / Results  Labs (all labs ordered are listed, but only abnormal results are displayed) Labs Reviewed  CBC WITH DIFFERENTIAL/PLATELET - Abnormal; Notable for the following:       Result Value   WBC 11.9 (*)    HCT 38.1 (*)    Platelets 129 (*)    Neutro Abs 10.5 (*)    Lymphs Abs 0.5 (*)    All other components within normal limits  CBG MONITORING, ED - Abnormal;  Notable for the following:    Glucose-Capillary 214 (*)    All other components within normal limits  COMPREHENSIVE METABOLIC PANEL  URINALYSIS, ROUTINE W REFLEX MICROSCOPIC  INFLUENZA PANEL BY PCR (TYPE A & B)  I-STAT CG4 LACTIC ACID, ED    EKG  EKG Interpretation None       Radiology No results found.  Procedures Procedures (including critical care time)  Medications Ordered in ED Medications - No data to display   Initial Impression / Assessment and Plan / ED Course  I have reviewed the triage vital signs and the nursing notes.  Pertinent labs & imaging  results that were available during my care of the patient were reviewed by me and considered in my medical decision making (see chart for details).    4 days of progressively worsening abdominal pain, bloating and fever. Decreased BM's/flatulence in last 2 days. Subjective fevers asw well. Abdomen distended, diffusely tender, decreased/no bowel sound Worried about obstruction v diverticulitis as cause for symptoms. Will help with symptoms and workup appropriately.  S/S c/w acute cholecystitis. Abx started. Fluid, NPO. D/w Dr. Arnoldo Morale who requested medicine admit who requested EGS consult.  [facility] to admit. Dr. Arnoldo Morale to consult.   Final Clinical Impressions(s) / ED Diagnoses   Final diagnoses:  Cholecystitis      Merrily Pew, MD 08/23/16 [phone]

## 2016-08-24 ENCOUNTER — Inpatient Hospital Stay (HOSPITAL_COMMUNITY): Payer: Medicare Other

## 2016-08-24 DIAGNOSIS — D696 Thrombocytopenia, unspecified: Secondary | ICD-10-CM

## 2016-08-24 DIAGNOSIS — I10 Essential (primary) hypertension: Secondary | ICD-10-CM

## 2016-08-24 DIAGNOSIS — E118 Type 2 diabetes mellitus with unspecified complications: Secondary | ICD-10-CM

## 2016-08-24 DIAGNOSIS — F319 Bipolar disorder, unspecified: Secondary | ICD-10-CM

## 2016-08-24 LAB — BASIC METABOLIC PANEL
ANION GAP: 8 (ref 5–15)
BUN: 17 mg/dL (ref 6–20)
CHLORIDE: 101 mmol/L (ref 101–111)
CO2: 26 mmol/L (ref 22–32)
Calcium: 8 mg/dL — ABNORMAL LOW (ref 8.9–10.3)
Creatinine, Ser: 1.14 mg/dL (ref 0.61–1.24)
GFR calc Af Amer: >60 mL/min (ref >60)
GFR calc non Af Amer: >60 mL/min (ref >60)
GLUCOSE: 212 mg/dL — AB (ref 65–99)
POTASSIUM: 4.2 mmol/L (ref 3.5–5.1)
Sodium: 135 mmol/L (ref 135–145)

## 2016-08-24 LAB — GLUCOSE, CAPILLARY
GLUCOSE-CAPILLARY: 212 mg/dL — AB (ref 65–99)
GLUCOSE-CAPILLARY: 207 mg/dL — AB (ref 65–99)
Glucose-Capillary: 213 mg/dL — ABNORMAL HIGH (ref 65–99)
Glucose-Capillary: 181 mg/dL — ABNORMAL HIGH (ref 65–99)

## 2016-08-24 LAB — SURGICAL PCR SCREEN
MRSA, PCR: NEGATIVE
Staphylococcus aureus: NEGATIVE

## 2016-08-24 LAB — CBC
HCT: 35 % — ABNORMAL LOW (ref 39.0–52.0)
HEMOGLOBIN: 11.5 g/dL — AB (ref 13.0–17.0)
MCH: 30.4 pg (ref 26.0–34.0)
MCHC: 32.9 g/dL (ref 30.0–36.0)
MCV: 92.6 fL (ref 78.0–100.0)
Platelets: 127 10*3/uL — ABNORMAL LOW (ref 150–400)
RBC: 3.78 MIL/uL — ABNORMAL LOW (ref 4.22–5.81)
RDW: 14.2 % (ref 11.5–15.5)
WBC: 9.8 10*3/uL (ref 4.0–10.5)

## 2016-08-24 MED ORDER — CHLORHEXIDINE GLUCONATE CLOTH 2 % EX PADS
6.0000 | MEDICATED_PAD | Freq: Once | CUTANEOUS | Status: AC
  Administered 2016-08-25: 6 via TOPICAL

## 2016-08-24 MED ORDER — ENOXAPARIN SODIUM 40 MG/0.4ML ~~LOC~~ SOLN
40.0000 mg | Freq: Once | SUBCUTANEOUS | Status: AC
  Administered 2016-08-25: 40 mg via SUBCUTANEOUS
  Filled 2016-08-24: qty 0.4

## 2016-08-24 MED ORDER — CHLORHEXIDINE GLUCONATE CLOTH 2 % EX PADS: 6.0000 | MEDICATED_PAD | Freq: Once | CUTANEOUS | Status: DC

## 2016-08-24 NOTE — Progress Notes (Signed)
Subjective: Patient still having mild right upper quadrant abdominal pain and nausea.  Objective: Vital signs in last 24 hours: Temp:  [98.2 F (36.8 C)-104.2 F (40.1 C)] 104.2 F (40.1 C) (04/24 1157) Pulse Rate:  [93-128] 98 (04/24 1157) Resp:  [18-28] 18 (04/24 0652) BP: (124-162)/(73-103) 127/81 (04/24 1157) SpO2:  [92 %-99 %] 95 % (04/24 0652) Weight:  [220 lb (99.8 kg)] 220 lb (99.8 kg) (04/23 1246) Last BM Date: 08/21/16  Intake/Output from previous day: 04/23 0701 - 04/24 0700 In: 1265 [P.O.:240; I.V.:1025] Out: 300 [Urine:300] Intake/Output this shift: No intake/output data recorded.  General appearance: alert, cooperative and no distress GI: Soft with mild tenderness to deep palpation right upper quadrant. No rigidity noted.  Lab Results:   Recent Labs  08/23/16 1257 08/24/16 0439  WBC 11.9* 9.8  HGB 13.1 11.5*  HCT 38.1* 35.0*  PLT 129* 127*   BMET  Recent Labs  08/23/16 1257 08/24/16 0439  NA 132* 135  K 3.9 4.2  CL 99* 101  CO2 22 26  GLUCOSE 251* 212*  BUN 19 17  CREATININE 1.04 1.14  CALCIUM 8.6* 8.0*   PT/INR  Recent Labs  08/23/16 1958  LABPROT 15.5*  INR 1.22    Studies/Results: Dg Chest 2 View  Result Date: 08/23/2016 CLINICAL DATA:  Tightness in soreness in chest and abdomen, shortness of breath for 3 days, history hypertension, diabetes mellitus EXAM: CHEST  2 VIEW COMPARISON:  08/04/2015 FINDINGS: Mild enlargement of cardiac silhouette. Mediastinal contours and pulmonary vascularity normal. Lungs clear. No pleural effusion or pneumothorax. Bones unremarkable. IMPRESSION: Mild enlargement of cardiac silhouette. No acute abnormalities. Electronically Signed   By: Lavonia Dana M.D.   On: 08/23/2016 14:42   Ct Abdomen Pelvis W Contrast  Result Date: 08/23/2016 CLINICAL DATA:  Fever, nausea, and weakness for 2 days. EXAM: CT ABDOMEN AND PELVIS WITH CONTRAST TECHNIQUE: Multidetector CT imaging of the abdomen and pelvis was  performed using the standard protocol following bolus administration of intravenous contrast. CONTRAST:  124mL ISOVUE-300 IOPAMIDOL (ISOVUE-300) INJECTION 61% COMPARISON:  08/21/2012 FINDINGS: Lower chest: The visualized lung bases are clear. Hepatobiliary: No focal liver abnormality is seen. The gallbladder is moderately distended with new wall thickening and pericholecystic inflammatory change. A punctate calculus is noted dependently in the gallbladder. There is mild common bile duct dilatation up to 10 mm in diameter without an obstructing stone or mass identified. Pancreas: No mass, inflammatory change, or ductal dilatation. Spleen: Unremarkable. Adrenals/Urinary Tract: Unremarkable adrenal glands. 17 mm left lower pole renal cyst has enlarged. Subcentimeter left renal hypodensities are too small to fully characterize. There is a 5 mm stone in the left renal pelvis which is new but is without associated hydronephrosis. The bladder is unremarkable. Stomach/Bowel: The stomach is within normal limits. There is no evidence of bowel obstruction or inflammation. The appendix is unremarkable. Vascular/Lymphatic: Mild abdominal aortic atherosclerosis without aneurysm. 11 mm porta hepatis lymph node, likely reactive. Reproductive: Unremarkable prostate. Other: No intraperitoneal free fluid. Small fat containing paraumbilical hernia. Musculoskeletal: Mild thoracolumbar spondylosis. Moderate disc space narrowing at L4-5 with vacuum disc. IMPRESSION: 1. Gallbladder wall thickening and pericholecystic inflammation concerning for acute cholecystitis. Punctate calcified stone in the gallbladder. 2. Mild common bile duct dilatation without definite stone identified. 3. Nonobstructing left renal calculus. 4.  Aortic Atherosclerosis (ICD10-I70.0). Electronically Signed   By: Logan Bores M.D.   On: 08/23/2016 15:42   US Abdomen Limited Ruq  Result Date: 08/24/2016 CLINICAL DATA:  Cholecystitis.  Questionable bile  duct  dilation. EXAM: US ABDOMEN LIMITED - RIGHT UPPER QUADRANT COMPARISON:  CT, 08/23/2016 FINDINGS: Gallbladder: Wall is thickened to 1 cm. Small nonshadowing stone in the gallbladder neck measuring 7 mm. Some sludge dependently. No sonographic Murphy's sign. No pericholecystic fluid. Common bile duct: Diameter: 10 mm.  No duct stone visualized. Liver: Increased parenchymal echogenicity.  No mass or focal lesion. IMPRESSION: 1. Findings are consistent with acute cholecystitis with gallbladder wall thickening a small amount of sludge in 1 small stone. 2. Proximal common bile duct is dilated to 1 cm. No duct stone or sludge is seen. 3. Hepatic steatosis. Electronically Signed   By: Lajean Manes M.D.   On: 08/24/2016 09:16    Anti-infectives: Anti-infectives    Start     Dose/Rate Route Frequency Ordered Stop   08/23/16 2200  piperacillin-tazobactam (ZOSYN) IVPB 3.375 g     3.375 g 12.5 mL/hr over 240 Minutes Intravenous Every 8 hours 08/23/16 1857     08/23/16 1615  piperacillin-tazobactam (ZOSYN) IVPB 3.375 g     3.375 g 100 mL/hr over 30 Minutes Intravenous  Once 08/23/16 1607 08/23/16 1733      Assessment/Plan: Impression: Cholecystitis, cholelithiasis, dilated common bile duct Plan: Patient will undergo laparoscopic cholecystectomy with intraoperative cholangiograms tomorrow. The risks and benefits of the procedure including bleeding, infection, hepatobiliary injury, and the possibility of an open procedure were fully explained to the patient, who gave informed consent.  LOS: 1 day    [name] 08/24/2016

## 2016-08-24 NOTE — Care Management Note (Signed)
Case Management Note  Patient Details  Name: Robert Walters MRN: [id number] Date of Birth: [birth year]  Subjective/Objective:                  Pt admitted with cholecystitis. Pt is ind with ADL's. He lives alone. He has PCP, transportation to appointments. He has insurance with drug coverage. No needs communicated. Pt scheduled for lap chole on 08/25/2016.  Action/Plan: CM will cont to follow for DC needs.   Expected Discharge Date:     08/26/2016             Expected Discharge Plan:  Home/Self Care  In-House Referral:  NA  Discharge planning Services  CM Consult  Post Acute Care Choice:  NA Choice offered to:  NA  Status of Service:  In process, will continue to follow   Sherald Barge, RN 08/24/2016, 3:39 PM

## 2016-08-24 NOTE — Progress Notes (Signed)
PROGRESS NOTE    DERAN BARRO  QTM:[id number] DOB: Apr 26, 1956 DOA: 08/23/2016 PCP: Maximino Greenland, MD    Brief Narrative:  Robert Walters is a 61 y.o. male with medical history significant of DM, CVA in 3/17, HTN, HLD, GERD, and anxiety/depression presenting with fever and abdominal pain.  Sunday, he ate a bit of dinner at noon but didn\'t feel well Saturday and did not eat.  He has not eaten anything since yesterday noon - but has been drinking water.  He noticed symptoms on Friday, TTP in RUQ.  Difficulty to lie flat "because it would kind of choke me down a little bit."  Fever started last night, 100.5.  +chills/sweats.  +nausea, no vomiting (even after he tried to force himself to vomit).  No diarrhea, no BMs in the last 2 days.  Has not noticed any jaundice.  Mild heartburn, worse with lying down.   Assessment & Plan:   Principal Problem:   Sepsis (HCC) Active Problems:   HTN (hypertension)   Diabetes mellitus type 2 with complications (HCC)   Bipolar 1 disorder (HCC)   Cholecystitis   Thrombocytopenia (HCC)   Acute cholecystitis - RUQ ultrasound showing acute cholecystitis - f/u blood cultures -Sepsis protocol initiated -Blood and urine cultures pending - Continue with IV Zosyn - Procalcitonin of 1.04  HTN - Continue Cozaar - Hold Lopressor and cover with IV Lopressor  DM -Glucose 190, 212, 207 -Hold Glucotrol and Glucophage -Cover with SSI  Bipolar -Continue Prozac, Lamictal -Hold Xanax and give IV Ativan as needed  Thrombocytopenia -Platelets 129, chronic and stable    DVT prophylaxis: SCDs Code Status: Full Family Communication: None present Disposition Plan:  Home once clinically improved   Consultants:   General Surgery  Procedures:   To go for lap chole tomorrow  Antimicrobials:   Zosyn    Subjective: Patient reports he feels significantly better this morning.  States his abdominal pain has improved.  He is waiting to talk to  Dr. Jenkins about possible surgery.  He states he is willing to get surgery if that is what he needs.  Objective: Vitals:   08/23/16 2200 08/24/16 0652 08/24/16 1143 08/24/16 1157  BP: 134/78 124/73  127/81  Pulse: 96 93  98  Resp: 18 18    Temp: 99.3 F (37.4 C) 99.4 F (37.4 C) (!) 100.8 F (38.2 C) (!) 104.2 F (40.1 C)  TempSrc: Oral Oral Oral Rectal  SpO2: 98% 95%    Weight:      Height:        Intake/Output Summary (Last 24 hours) at 08/24/16 1351 Last data filed at 08/24/16 0900  Gross per 24 hour  Intake             1265 ml  Output              300 ml  Net              96 5 ml   Filed Weights   08/23/16 1246  Weight: 99.8 kg (220 lb)    Examination:  General exam: Appears calm and comfortable  Respiratory system: Clear to auscultation. Respiratory effort normal. Cardiovascular system: S1 & S2 heard, RRR. No JVD, murmurs, rubs, gallops or clicks. No pedal edema. Gastrointestinal system: Abdomen is nondistended, soft and minimally tender in the right upper quadrant and periumbilical area. No organomegaly or masses felt. Normal bowel sounds heard. Central nervous system: Alert and oriented. No focal neurological deficits. Extremities: Symmetric 5  x 5 power. Skin: No rashes, lesions or ulcers Psychiatry: Judgement and insight appear normal. Mood & affect appropriate.     Data Reviewed: I have personally reviewed following labs and imaging studies  CBC:  Recent Labs Lab 08/23/16 1257 08/24/16 0439  WBC 11.9* 9.8  NEUTROABS 10.5*  --   HGB 13.1 11.5*  HCT 38.1* 35.0*  MCV 90.1 92.6  PLT 129* 409*   Basic Metabolic Panel:  Recent Labs Lab 08/23/16 1257 08/24/16 0439  NA 132* 135  K 3.9 4.2  CL 99* 101  CO2 22 26  GLUCOSE 251* 212*  BUN 19 17  CREATININE 1.04 1.14  CALCIUM 8.6* 8.0*   GFR: Estimated Creatinine Clearance: 78.2 mL/min (by C-G formula based on SCr of 1.14 mg/dL). Liver Function Tests:  Recent Labs Lab 08/23/16 1257  AST 17    ALT 18  ALKPHOS 86  BILITOT 1.1  PROT 7.2  ALBUMIN 3.6   No results for input(s): LIPASE, AMYLASE in the last 168 hours. No results for input(s): AMMONIA in the last 168 hours. Coagulation Profile:  Recent Labs Lab 08/23/16 1958  INR 1.22   Cardiac Enzymes: No results for input(s): CKTOTAL, CKMB, CKMBINDEX, TROPONINI in the last 168 hours. BNP (last 3 results) No results for input(s): PROBNP in the last 8760 hours. HbA1C: No results for input(s): HGBA1C in the last 72 hours. CBG:  Recent Labs Lab 08/23/16 1301 08/23/16 2329 08/24/16 0912 08/24/16 1156  GLUCAP 214* 190* 212* 207*   Lipid Profile: No results for input(s): CHOL, HDL, LDLCALC, TRIG, CHOLHDL, LDLDIRECT in the last 72 hours. Thyroid Function Tests: No results for input(s): TSH, T4TOTAL, FREET4, T3FREE, THYROIDAB in the last 72 hours. Anemia Panel: No results for input(s): VITAMINB12, FOLATE, FERRITIN, TIBC, IRON, RETICCTPCT in the last 72 hours. Sepsis Labs:  Recent Labs Lab 08/23/16 1329 08/23/16 1356 08/23/16 1958  PROCALCITON  --   --  1.04  LATICACIDVEN 1.25 1.3 0.9    Recent Results (from the past 240 hour(s))  Culture, blood (x 2)     Status: None (Preliminary result)   Collection Time: 08/23/16  1:56 PM  Result Value Ref Range Status   Specimen Description BLOOD RIGHT FOREARM  Final   Special Requests Blood Culture adequate volume  Final   Culture NO GROWTH < 12 HOURS  Final   Report Status PENDING  Incomplete  Culture, blood (x 2)     Status: None (Preliminary result)   Collection Time: 08/23/16  8:03 PM  Result Value Ref Range Status   Specimen Description LEFT ANTECUBITAL  Final   Special Requests Blood Culture adequate volume  Final   Culture NO GROWTH < 12 HOURS  Final   Report Status PENDING  Incomplete         Radiology Studies: Dg Chest 2 View  Result Date: 08/23/2016 CLINICAL DATA:  Tightness in soreness in chest and abdomen, shortness of breath for 3 days, history  hypertension, diabetes mellitus EXAM: CHEST  2 VIEW COMPARISON:  08/04/2015 FINDINGS: Mild enlargement of cardiac silhouette. Mediastinal contours and pulmonary vascularity normal. Lungs clear. No pleural effusion or pneumothorax. Bones unremarkable. IMPRESSION: Mild enlargement of cardiac silhouette. No acute abnormalities. Electronically Signed   By: Lavonia Dana M.D.   On: 08/23/2016 14:42   Ct Abdomen Pelvis W Contrast  Result Date: 08/23/2016 CLINICAL DATA:  Fever, nausea, and weakness for 2 days. EXAM: CT ABDOMEN AND PELVIS WITH CONTRAST TECHNIQUE: Multidetector CT imaging of the abdomen and pelvis was performed  using the standard protocol following bolus administration of intravenous contrast. CONTRAST:  135mL ISOVUE-300 IOPAMIDOL (ISOVUE-300) INJECTION 61% COMPARISON:  08/21/2012 FINDINGS: Lower chest: The visualized lung bases are clear. Hepatobiliary: No focal liver abnormality is seen. The gallbladder is moderately distended with new wall thickening and pericholecystic inflammatory change. A punctate calculus is noted dependently in the gallbladder. There is mild common bile duct dilatation up to 10 mm in diameter without an obstructing stone or mass identified. Pancreas: No mass, inflammatory change, or ductal dilatation. Spleen: Unremarkable. Adrenals/Urinary Tract: Unremarkable adrenal glands. 17 mm left lower pole renal cyst has enlarged. Subcentimeter left renal hypodensities are too small to fully characterize. There is a 5 mm stone in the left renal pelvis which is new but is without associated hydronephrosis. The bladder is unremarkable. Stomach/Bowel: The stomach is within normal limits. There is no evidence of bowel obstruction or inflammation. The appendix is unremarkable. Vascular/Lymphatic: Mild abdominal aortic atherosclerosis without aneurysm. 11 mm porta hepatis lymph node, likely reactive. Reproductive: Unremarkable prostate. Other: No intraperitoneal free fluid. Small fat containing  paraumbilical hernia. Musculoskeletal: Mild thoracolumbar spondylosis. Moderate disc space narrowing at L4-5 with vacuum disc. IMPRESSION: 1. Gallbladder wall thickening and pericholecystic inflammation concerning for acute cholecystitis. Punctate calcified stone in the gallbladder. 2. Mild common bile duct dilatation without definite stone identified. 3. Nonobstructing left renal calculus. 4.  Aortic Atherosclerosis (ICD10-I70.0). Electronically Signed   By: Logan Bores M.D.   On: 08/23/2016 15:42   US Abdomen Limited Ruq  Result Date: 08/24/2016 CLINICAL DATA:  Cholecystitis.  Questionable bile duct dilation. EXAM: US ABDOMEN LIMITED - RIGHT UPPER QUADRANT COMPARISON:  CT, 08/23/2016 FINDINGS: Gallbladder: Wall is thickened to 1 cm. Small nonshadowing stone in the gallbladder neck measuring 7 mm. Some sludge dependently. No sonographic Murphy's sign. No pericholecystic fluid. Common bile duct: Diameter: 10 mm.  No duct stone visualized. Liver: Increased parenchymal echogenicity.  No mass or focal lesion. IMPRESSION: 1. Findings are consistent with acute cholecystitis with gallbladder wall thickening a small amount of sludge in 1 small stone. 2. Proximal common bile duct is dilated to 1 cm. No duct stone or sludge is seen. 3. Hepatic steatosis. Electronically Signed   By: Lajean Manes M.D.   On: 08/24/2016 09:16        Scheduled Meds: . atorvastatin  40 mg Oral q1800  . FLUoxetine  20 mg Oral Daily  . insulin aspart  0-15 Units Subcutaneous TID WC  . lamoTRIgine  400 mg Oral QHS  . losartan  100 mg Oral Daily  . metoprolol  5 mg Intravenous Q12H  . tamsulosin  0.4 mg Oral BID   Continuous Infusions: . lactated ringers 125 mL/hr at 08/24/16 1138  . piperacillin-tazobactam (ZOSYN)  IV Stopped (08/24/16 1013)     LOS: 1 day    Time spent: 30 minutes    Loretha Stapler, MD [facility] Pager [phone]  If 7PM-7AM, please contact night-coverage [email or web address] Password  [email or web address] 08/24/2016, 1:51 PM

## 2016-08-25 ENCOUNTER — Encounter (HOSPITAL_COMMUNITY)
Admission: EM | Disposition: A | Discharge status: Home Health | Payer: Self-pay | Source: Home / Self Care | Attending: General Surgery

## 2016-08-25 ENCOUNTER — Inpatient Hospital Stay (HOSPITAL_COMMUNITY): Payer: Medicare Other

## 2016-08-25 ENCOUNTER — Inpatient Hospital Stay (HOSPITAL_COMMUNITY): Payer: Medicare Other | Admitting: Anesthesiology

## 2016-08-25 ENCOUNTER — Encounter (HOSPITAL_COMMUNITY): Payer: Self-pay | Admitting: *Deleted

## 2016-08-25 HISTORY — PX: CHOLECYSTECTOMY: SHX55

## 2016-08-25 LAB — BASIC METABOLIC PANEL
ANION GAP: 9 (ref 5–15)
BUN: 24 mg/dL — ABNORMAL HIGH (ref 6–20)
CHLORIDE: 99 mmol/L — AB (ref 101–111)
CO2: 25 mmol/L (ref 22–32)
Calcium: 7.7 mg/dL — ABNORMAL LOW (ref 8.9–10.3)
Creatinine, Ser: 1.54 mg/dL — ABNORMAL HIGH (ref 0.61–1.24)
GFR calc Af Amer: 54 mL/min — ABNORMAL LOW (ref >60)
GFR, EST NON AFRICAN AMERICAN: 47 mL/min — AB (ref >60)
GLUCOSE: 164 mg/dL — AB (ref 65–99)
POTASSIUM: 3.6 mmol/L (ref 3.5–5.1)
Sodium: 133 mmol/L — ABNORMAL LOW (ref 135–145)

## 2016-08-25 LAB — URINE CULTURE

## 2016-08-25 LAB — CBC
HEMATOCRIT: 30.6 % — AB (ref 39.0–52.0)
HEMOGLOBIN: 10.5 g/dL — AB (ref 13.0–17.0)
MCH: 31.1 pg (ref 26.0–34.0)
MCHC: 34.3 g/dL (ref 30.0–36.0)
MCV: 90.5 fL (ref 78.0–100.0)
Platelets: 128 10*3/uL — ABNORMAL LOW (ref 150–400)
RBC: 3.38 MIL/uL — AB (ref 4.22–5.81)
RDW: 14.4 % (ref 11.5–15.5)
WBC: 9.5 10*3/uL (ref 4.0–10.5)

## 2016-08-25 LAB — GLUCOSE, CAPILLARY
GLUCOSE-CAPILLARY: 157 mg/dL — AB (ref 65–99)
GLUCOSE-CAPILLARY: 155 mg/dL — AB (ref 65–99)
GLUCOSE-CAPILLARY: 188 mg/dL — AB (ref 65–99)
Glucose-Capillary: 149 mg/dL — ABNORMAL HIGH (ref 65–99)

## 2016-08-25 LAB — URINALYSIS, ROUTINE W REFLEX MICROSCOPIC
Bilirubin Urine: NEGATIVE
Glucose, UA: NEGATIVE mg/dL
KETONES UR: NEGATIVE mg/dL
Leukocytes, UA: NEGATIVE
Nitrite: NEGATIVE
PROTEIN: 30 mg/dL — AB
Specific Gravity, Urine: 1.015 (ref 1.005–1.030)
pH: 6 (ref 5.0–8.0)

## 2016-08-25 LAB — HEMOGLOBIN A1C
Hgb A1c MFr Bld: 6.4 % — ABNORMAL HIGH (ref 4.8–5.6)
MEAN PLASMA GLUCOSE: 137 mg/dL

## 2016-08-25 LAB — HIV ANTIBODY (ROUTINE TESTING W REFLEX): HIV Screen 4th Generation wRfx: NONREACTIVE

## 2016-08-25 SURGERY — LAPAROSCOPIC CHOLECYSTECTOMY WITH INTRAOPERATIVE CHOLANGIOGRAM
Anesthesia: General

## 2016-08-25 MED ORDER — SIMETHICONE 80 MG PO CHEW
40.0000 mg | CHEWABLE_TABLET | Freq: Four times a day (QID) | ORAL | Status: DC | PRN
  Administered 2016-08-27: 40 mg via ORAL
  Filled 2016-08-25: qty 1

## 2016-08-25 MED ORDER — MIDAZOLAM HCL 2 MG/2ML IJ SOLN
1.0000 mg | INTRAMUSCULAR | Status: DC
  Administered 2016-08-25: 2 mg via INTRAVENOUS

## 2016-08-25 MED ORDER — IOPAMIDOL (ISOVUE-300) INJECTION 61%
INTRAVENOUS | Status: AC
  Filled 2016-08-25: qty 50

## 2016-08-25 MED ORDER — NEOSTIGMINE METHYLSULFATE 10 MG/10ML IV SOLN
INTRAVENOUS | Status: DC | PRN
  Administered 2016-08-25: 1 mg via INTRAVENOUS
  Administered 2016-08-25 (×2): 2 mg via INTRAVENOUS

## 2016-08-25 MED ORDER — LACTATED RINGERS IV SOLN
INTRAVENOUS | Status: DC
  Administered 2016-08-25 (×2): via INTRAVENOUS

## 2016-08-25 MED ORDER — LIDOCAINE HCL 1 % IJ SOLN
INTRAMUSCULAR | Status: DC | PRN
Start: 2016-08-25 — End: 2016-08-25
  Administered 2016-08-25: 30 mg via INTRADERMAL

## 2016-08-25 MED ORDER — PROPOFOL 10 MG/ML IV BOLUS
INTRAVENOUS | Status: AC
  Filled 2016-08-25: qty 20

## 2016-08-25 MED ORDER — ROCURONIUM BROMIDE 100 MG/10ML IV SOLN
INTRAVENOUS | Status: DC | PRN
  Administered 2016-08-25: 20 mg via INTRAVENOUS
  Administered 2016-08-25 (×2): 5 mg via INTRAVENOUS

## 2016-08-25 MED ORDER — FENTANYL CITRATE (PF) 100 MCG/2ML IJ SOLN
INTRAMUSCULAR | Status: DC | PRN
  Administered 2016-08-25 (×2): 50 ug via INTRAVENOUS
  Administered 2016-08-25: 25 ug via INTRAVENOUS
  Administered 2016-08-25 (×2): 50 ug via INTRAVENOUS
  Administered 2016-08-25: 25 ug via INTRAVENOUS

## 2016-08-25 MED ORDER — ONDANSETRON HCL 4 MG/2ML IJ SOLN
4.0000 mg | Freq: Once | INTRAMUSCULAR | Status: AC
Start: 2016-08-25 — End: 2016-08-25
  Administered 2016-08-25: 4 mg via INTRAVENOUS

## 2016-08-25 MED ORDER — METOPROLOL TARTRATE 5 MG/5ML IV SOLN
INTRAVENOUS | Status: DC | PRN
  Administered 2016-08-25: 1 mg via INTRAVENOUS
  Administered 2016-08-25: 2 mg via INTRAVENOUS

## 2016-08-25 MED ORDER — LIDOCAINE HCL (PF) 1 % IJ SOLN
INTRAMUSCULAR | Status: AC
  Filled 2016-08-25: qty 5

## 2016-08-25 MED ORDER — GLYCOPYRROLATE 0.2 MG/ML IJ SOLN
INTRAMUSCULAR | Status: AC
  Filled 2016-08-25: qty 3

## 2016-08-25 MED ORDER — ONDANSETRON HCL 4 MG/2ML IJ SOLN
INTRAMUSCULAR | Status: AC
  Filled 2016-08-25: qty 2

## 2016-08-25 MED ORDER — HYDROMORPHONE HCL 1 MG/ML IJ SOLN
INTRAMUSCULAR | Status: AC
  Filled 2016-08-25: qty 1

## 2016-08-25 MED ORDER — MIDAZOLAM HCL 5 MG/5ML IJ SOLN
INTRAMUSCULAR | Status: DC | PRN
  Administered 2016-08-25: 2 mg via INTRAVENOUS

## 2016-08-25 MED ORDER — PROPOFOL 10 MG/ML IV BOLUS
INTRAVENOUS | Status: DC | PRN
Start: 2016-08-25 — End: 2016-08-25
  Administered 2016-08-25: 130 mg via INTRAVENOUS

## 2016-08-25 MED ORDER — ROCURONIUM BROMIDE 50 MG/5ML IV SOLN
INTRAVENOUS | Status: AC
  Filled 2016-08-25: qty 1

## 2016-08-25 MED ORDER — HYDROMORPHONE HCL 1 MG/ML IJ SOLN
0.2500 mg | INTRAMUSCULAR | Status: DC | PRN
  Administered 2016-08-25 (×2): 0.5 mg via INTRAVENOUS

## 2016-08-25 MED ORDER — OXYCODONE-ACETAMINOPHEN 5-325 MG PO TABS
1.0000 | ORAL_TABLET | ORAL | Status: DC | PRN
  Administered 2016-08-25 – 2016-08-29 (×4): 2 via ORAL
  Administered 2016-08-29: 1 via ORAL
  Administered 2016-08-29: 2 via ORAL
  Administered 2016-08-29: 1 via ORAL
  Administered 2016-08-30 (×3): 2 via ORAL
  Filled 2016-08-25 (×3): qty 2
  Filled 2016-08-25: qty 1
  Filled 2016-08-25 (×6): qty 2

## 2016-08-25 MED ORDER — MIDAZOLAM HCL 2 MG/2ML IJ SOLN
INTRAMUSCULAR | Status: AC
  Filled 2016-08-25: qty 2

## 2016-08-25 MED ORDER — GLYCOPYRROLATE 0.2 MG/ML IJ SOLN
INTRAMUSCULAR | Status: DC | PRN
  Administered 2016-08-25: 0.6 mg via INTRAVENOUS

## 2016-08-25 MED ORDER — FENTANYL CITRATE (PF) 250 MCG/5ML IJ SOLN
INTRAMUSCULAR | Status: AC
  Filled 2016-08-25: qty 5

## 2016-08-25 MED ORDER — HYDROMORPHONE HCL 1 MG/ML IJ SOLN
1.0000 mg | INTRAMUSCULAR | Status: DC | PRN
  Administered 2016-08-26 – 2016-08-29 (×8): 1 mg via INTRAVENOUS
  Filled 2016-08-25 (×9): qty 1

## 2016-08-25 MED ORDER — BUPIVACAINE HCL (PF) 0.5 % IJ SOLN
INTRAMUSCULAR | Status: AC
  Filled 2016-08-25: qty 30

## 2016-08-25 MED ORDER — METOPROLOL TARTRATE 5 MG/5ML IV SOLN
INTRAVENOUS | Status: AC
  Filled 2016-08-25: qty 5

## 2016-08-25 MED ORDER — ARTIFICIAL TEARS OPHTHALMIC OINT
TOPICAL_OINTMENT | OPHTHALMIC | Status: AC
  Filled 2016-08-25: qty 3.5

## 2016-08-25 MED ORDER — SUCCINYLCHOLINE CHLORIDE 20 MG/ML IJ SOLN
INTRAMUSCULAR | Status: DC | PRN
  Administered 2016-08-25: 150 mg via INTRAVENOUS

## 2016-08-25 MED ORDER — KETOROLAC TROMETHAMINE 30 MG/ML IJ SOLN
INTRAMUSCULAR | Status: AC
  Filled 2016-08-25: qty 1

## 2016-08-25 MED ORDER — KETOROLAC TROMETHAMINE 30 MG/ML IJ SOLN
30.0000 mg | Freq: Once | INTRAMUSCULAR | Status: AC
  Administered 2016-08-25: 30 mg via INTRAVENOUS

## 2016-08-25 MED ORDER — SODIUM CHLORIDE 0.9 % IV SOLN
INTRAVENOUS | Status: DC
  Administered 2016-08-25 – 2016-08-29 (×5): via INTRAVENOUS

## 2016-08-25 MED ORDER — POVIDONE-IODINE 10 % EX OINT
TOPICAL_OINTMENT | CUTANEOUS | Status: AC
  Filled 2016-08-25: qty 1

## 2016-08-25 MED ORDER — PHENYLEPHRINE HCL 10 MG/ML IJ SOLN
INTRAMUSCULAR | Status: DC | PRN
  Administered 2016-08-25: 80 ug via INTRAVENOUS
  Administered 2016-08-25: 40 ug via INTRAVENOUS

## 2016-08-25 MED ORDER — BUPIVACAINE HCL 0.5 % IJ SOLN
INTRAMUSCULAR | Status: DC | PRN
  Administered 2016-08-25: 10 mL

## 2016-08-25 MED ORDER — 0.9 % SODIUM CHLORIDE (POUR BTL) OPTIME
TOPICAL | Status: DC | PRN
  Administered 2016-08-25: 1000 mL

## 2016-08-25 SURGICAL SUPPLY — 54 items
APPLICATOR ARISTA FLEXITIP XL (MISCELLANEOUS) ×2 IMPLANT
APPLIER CLIP LAPSCP 10X32 DD (CLIP) ×2 IMPLANT
BAG HAMPER (MISCELLANEOUS) ×2 IMPLANT
BAG RETRIEVAL 10 (BASKET) ×1
CATH CHOLANGIOGRAM 4.5FR (CATHETERS) ×2 IMPLANT
CHLORAPREP W/TINT 26ML (MISCELLANEOUS) ×2 IMPLANT
CLOTH BEACON ORANGE TIMEOUT ST (SAFETY) ×2 IMPLANT
COVER LIGHT HANDLE STERIS (MISCELLANEOUS) ×4 IMPLANT
COVER MAYO STAND XLG (DRAPE) IMPLANT
CUTTER LINEAR ENDO 35 ART FLEX (STAPLE) ×2 IMPLANT
DECANTER SPIKE VIAL GLASS SM (MISCELLANEOUS) ×4 IMPLANT
DISSECTOR BLUNT TIP ENDO 5MM (MISCELLANEOUS) IMPLANT
DRAPE C-ARM FOLDED MOBILE STRL (DRAPES) ×2 IMPLANT
ELECT REM PT RETURN 9FT ADLT (ELECTROSURGICAL) ×2
ELECTRODE REM PT RTRN 9FT ADLT (ELECTROSURGICAL) ×1 IMPLANT
EVACUATOR DRAINAGE 10X20 100CC (DRAIN) ×1 IMPLANT
EVACUATOR SILICONE 100CC (DRAIN) ×1
FILTER SMOKE EVAC LAPAROSHD (FILTER) ×2 IMPLANT
FORMALIN 10 PREFIL 120ML (MISCELLANEOUS) ×2 IMPLANT
GLOVE BIOGEL PI IND STRL 7.0 (GLOVE) ×1 IMPLANT
GLOVE BIOGEL PI INDICATOR 7.0 (GLOVE) ×1
GLOVE SURG SS PI 7.5 STRL IVOR (GLOVE) ×4 IMPLANT
GOWN STRL REUS W/ TWL LRG LVL3 (GOWN DISPOSABLE) ×2 IMPLANT
GOWN STRL REUS W/ TWL XL LVL3 (GOWN DISPOSABLE) ×2 IMPLANT
GOWN STRL REUS W/TWL LRG LVL3 (GOWN DISPOSABLE) ×2
GOWN STRL REUS W/TWL XL LVL3 (GOWN DISPOSABLE) ×2
HEMOSTAT ARISTA ABSORB 3G PWDR (MISCELLANEOUS) ×2 IMPLANT
HEMOSTAT SNOW SURGICEL 2X4 (HEMOSTASIS) ×2 IMPLANT
INST SET LAPROSCOPIC AP (KITS) ×2 IMPLANT
IV NS IRRIG 3000ML ARTHROMATIC (IV SOLUTION) ×2 IMPLANT
KIT ROOM TURNOVER APOR (KITS) ×2 IMPLANT
MANIFOLD NEPTUNE II (INSTRUMENTS) ×2 IMPLANT
NEEDLE INSUFFLATION 120MM (ENDOMECHANICALS) ×2 IMPLANT
NS IRRIG 1000ML POUR BTL (IV SOLUTION) ×2 IMPLANT
PACK LAP CHOLE LZT030E (CUSTOM PROCEDURE TRAY) ×2 IMPLANT
PAD ARMBOARD 7.5X6 YLW CONV (MISCELLANEOUS) ×2 IMPLANT
SET BASIN LINEN APH (SET/KITS/TRAYS/PACK) ×2 IMPLANT
SET TUBE IRRIG SUCTION NO TIP (IRRIGATION / IRRIGATOR) ×2 IMPLANT
SLEEVE ENDOPATH XCEL 5M (ENDOMECHANICALS) ×2 IMPLANT
SPONGE GAUZE 2X2 8PLY STRL LF (GAUZE/BANDAGES/DRESSINGS) ×2 IMPLANT
STAPLER VISISTAT (STAPLE) ×2 IMPLANT
SUT ETHILON 3 0 FSL (SUTURE) ×2 IMPLANT
SUT VICRYL 0 UR6 27IN ABS (SUTURE) ×2 IMPLANT
SYR 20CC LL (SYRINGE) ×2 IMPLANT
SYR 30ML LL (SYRINGE) ×2 IMPLANT
SYR CONTROL 10ML LL (SYRINGE) ×2 IMPLANT
SYS BAG RETRIEVAL 10MM (BASKET) ×1
SYSTEM BAG RETRIEVAL 10MM (BASKET) ×1 IMPLANT
TROCAR ENDO BLADELESS 11MM (ENDOMECHANICALS) ×2 IMPLANT
TROCAR XCEL NON-BLD 5MMX100MML (ENDOMECHANICALS) ×2 IMPLANT
TROCAR XCEL UNIV SLVE 11M 100M (ENDOMECHANICALS) ×2 IMPLANT
TUBING INSUFFLATION (TUBING) ×2 IMPLANT
WARMER LAPAROSCOPE (MISCELLANEOUS) ×2 IMPLANT
YANKAUER SUCT 12FT TUBE ARGYLE (SUCTIONS) ×2 IMPLANT

## 2016-08-25 NOTE — H&P (View-Only) (Signed)
Subjective: Patient still having mild right upper quadrant abdominal pain and nausea.  Objective: Vital signs in last 24 hours: Temp:  [98.2 F (36.8 C)-104.2 F (40.1 C)] 104.2 F (40.1 C) (04/24 1157) Pulse Rate:  [93-128] 98 (04/24 1157) Resp:  [18-28] 18 (04/24 0652) BP: (124-162)/(73-103) 127/81 (04/24 1157) SpO2:  [92 %-99 %] 95 % (04/24 0652) Weight:  [220 lb (99.8 kg)] 220 lb (99.8 kg) (04/23 1246) Last BM Date: 08/21/16  Intake/Output from previous day: 04/23 0701 - 04/24 0700 In: 1265 [P.O.:240; I.V.:1025] Out: 300 [Urine:300] Intake/Output this shift: No intake/output data recorded.  General appearance: alert, cooperative and no distress GI: Soft with mild tenderness to deep palpation right upper quadrant. No rigidity noted.  Lab Results:   Recent Labs  08/23/16 1257 08/24/16 0439  WBC 11.9* 9.8  HGB 13.1 11.5*  HCT 38.1* 35.0*  PLT 129* 127*   BMET  Recent Labs  08/23/16 1257 08/24/16 0439  NA 132* 135  K 3.9 4.2  CL 99* 101  CO2 22 26  GLUCOSE 251* 212*  BUN 19 17  CREATININE 1.04 1.14  CALCIUM 8.6* 8.0*   PT/INR  Recent Labs  08/23/16 1958  LABPROT 15.5*  INR 1.22    Studies/Results: Dg Chest 2 View  Result Date: 08/23/2016 CLINICAL DATA:  Tightness in soreness in chest and abdomen, shortness of breath for 3 days, history hypertension, diabetes mellitus EXAM: CHEST  2 VIEW COMPARISON:  08/04/2015 FINDINGS: Mild enlargement of cardiac silhouette. Mediastinal contours and pulmonary vascularity normal. Lungs clear. No pleural effusion or pneumothorax. Bones unremarkable. IMPRESSION: Mild enlargement of cardiac silhouette. No acute abnormalities. Electronically Signed   By: Lavonia Dana M.D.   On: 08/23/2016 14:42   Ct Abdomen Pelvis W Contrast  Result Date: 08/23/2016 CLINICAL DATA:  Fever, nausea, and weakness for 2 days. EXAM: CT ABDOMEN AND PELVIS WITH CONTRAST TECHNIQUE: Multidetector CT imaging of the abdomen and pelvis was  performed using the standard protocol following bolus administration of intravenous contrast. CONTRAST:  140mL ISOVUE-300 IOPAMIDOL (ISOVUE-300) INJECTION 61% COMPARISON:  08/21/2012 FINDINGS: Lower chest: The visualized lung bases are clear. Hepatobiliary: No focal liver abnormality is seen. The gallbladder is moderately distended with new wall thickening and pericholecystic inflammatory change. A punctate calculus is noted dependently in the gallbladder. There is mild common bile duct dilatation up to 10 mm in diameter without an obstructing stone or mass identified. Pancreas: No mass, inflammatory change, or ductal dilatation. Spleen: Unremarkable. Adrenals/Urinary Tract: Unremarkable adrenal glands. 17 mm left lower pole renal cyst has enlarged. Subcentimeter left renal hypodensities are too small to fully characterize. There is a 5 mm stone in the left renal pelvis which is new but is without associated hydronephrosis. The bladder is unremarkable. Stomach/Bowel: The stomach is within normal limits. There is no evidence of bowel obstruction or inflammation. The appendix is unremarkable. Vascular/Lymphatic: Mild abdominal aortic atherosclerosis without aneurysm. 11 mm porta hepatis lymph node, likely reactive. Reproductive: Unremarkable prostate. Other: No intraperitoneal free fluid. Small fat containing paraumbilical hernia. Musculoskeletal: Mild thoracolumbar spondylosis. Moderate disc space narrowing at L4-5 with vacuum disc. IMPRESSION: 1. Gallbladder wall thickening and pericholecystic inflammation concerning for acute cholecystitis. Punctate calcified stone in the gallbladder. 2. Mild common bile duct dilatation without definite stone identified. 3. Nonobstructing left renal calculus. 4.  Aortic Atherosclerosis (ICD10-I70.0). Electronically Signed   By: Logan Bores M.D.   On: 08/23/2016 15:42   US Abdomen Limited Ruq  Result Date: 08/24/2016 CLINICAL DATA:  Cholecystitis.  Questionable bile  duct  dilation. EXAM: US ABDOMEN LIMITED - RIGHT UPPER QUADRANT COMPARISON:  CT, 08/23/2016 FINDINGS: Gallbladder: Wall is thickened to 1 cm. Small nonshadowing stone in the gallbladder neck measuring 7 mm. Some sludge dependently. No sonographic Murphy's sign. No pericholecystic fluid. Common bile duct: Diameter: 10 mm.  No duct stone visualized. Liver: Increased parenchymal echogenicity.  No mass or focal lesion. IMPRESSION: 1. Findings are consistent with acute cholecystitis with gallbladder wall thickening a small amount of sludge in 1 small stone. 2. Proximal common bile duct is dilated to 1 cm. No duct stone or sludge is seen. 3. Hepatic steatosis. Electronically Signed   By: Lajean Manes M.D.   On: 08/24/2016 09:16    Anti-infectives: Anti-infectives    Start     Dose/Rate Route Frequency Ordered Stop   08/23/16 2200  piperacillin-tazobactam (ZOSYN) IVPB 3.375 g     3.375 g 12.5 mL/hr over 240 Minutes Intravenous Every 8 hours 08/23/16 1857     08/23/16 1615  piperacillin-tazobactam (ZOSYN) IVPB 3.375 g     3.375 g 100 mL/hr over 30 Minutes Intravenous  Once 08/23/16 1607 08/23/16 1733      Assessment/Plan: Impression: Cholecystitis, cholelithiasis, dilated common bile duct Plan: Patient will undergo laparoscopic cholecystectomy with intraoperative cholangiograms tomorrow. The risks and benefits of the procedure including bleeding, infection, hepatobiliary injury, and the possibility of an open procedure were fully explained to the patient, who gave informed consent.  LOS: 1 day    [name] 08/24/2016

## 2016-08-25 NOTE — Op Note (Signed)
Patient:  Robert Walters  DOB:  28-May-1955  MRN:  [id number]   Preop Diagnosis:  Acute cholecystitis, cholelithiasis  Postop Diagnosis:  Same, gangrene of gallbladder, empyema  Procedure:  Laparoscopic cholecystectomy  Surgeon:  [name], M.D.  Anes:  Gen. endotracheal  Indications:  Patient is a 61 year old white male who presents with acute cholecystitis secondary to cholelithiasis. The risks and benefits of the procedure including bleeding, infection, hepatobiliary injury, the possibility of an open procedure were fully explained to the patient, who gave informed consent.  Procedure note:  The patient was placed in supine position. After induction of general endotracheal anesthesia, the abdomen was prepped and draped using usual sterile technique with DuraPrep. Surgical site confirmation was performed.  A supraumbilical incision was made down to the fascia. A Veress needle was introduced into the abdominal cavity and confirmation of placement was done using the saline drop test. The abdomen was then insufflated to 16 mmHg pressure. The patient was placed in reverse Trendelenburg position and an additional 11 mm trocar was placed the epigastric region and 5 mm trochars were placed the right upper quadrant and right flank regions. The liver was inspected and noted to be within normal limits. The gallbladder wall was gangrenous. At the fundus of the gallbladder wall grasping it, the gallbladder down in an empyema was found. This was evacuated using suction. I was able to do a dome down retrograde approach down to the infundibulum. Multiple areas of the gallbladder wall was necrotic. Once I got down to the cystic duct. Its juncture to the infundibulum was fully identified. A standard Endo GIA was placed across the cystic duct and the cystic duct was divided. The cystic artery was identified and was presumed to be within the necrotic tissue of the cystic duct. The remaining gallbladder was freed  away from the gallbladder fossa using Bovie electrocautery. It was delivered through the epigastric trocar site using an Endo Catch bag. The gallbladder fossa was inspected and any bleeding was controlled using Bovie electrocautery. The right upper quadrant was copiously irrigated with normal saline. Surgicel and Arista were placed into the gallbladder bed. A #10 flat Terrial Rhodes drain was placed into the subhepatic space and brought through the 5 mm lateral trocar site. It was secured at the skin level using a 3-0 nylon interrupted suture. All fluid and air were then evacuated from the abdominal cavity prior to the removal of the trochars.  All wounds were irrigated with normal saline. All wounds were injected with 0.5% Sensorcaine. The supraumbilical fascia as well as epigastric fascia were reapproximated using 0 Vicryl interrupted sutures. All skin incisions were closed using staples. Betadine ointment and dry sterile dressings were applied.  All tape and needle counts were correct at the end of the procedure. Patient was extubated in the operating room and transferred to PACU in stable condition.  Complications:  None  EBL:  150 mL  Specimen:  Gallbladder  Drains: Jackson-Pratt drain to subhepatic space

## 2016-08-25 NOTE — Anesthesia Procedure Notes (Signed)
Procedure Name: Intubation Date/Time: 08/25/2016 3:22 PM Performed by: Charmaine Downs Pre-anesthesia Checklist: Patient identified, Patient being monitored, Timeout performed, Emergency Drugs available and Suction available Patient Re-evaluated:Patient Re-evaluated prior to inductionOxygen Delivery Method: Circle System Utilized Preoxygenation: Pre-oxygenation with 100% oxygen Intubation Type: IV induction, Rapid sequence and Cricoid Pressure applied Ventilation: Mask ventilation without difficulty Grade View: Grade III Tube type: Oral Tube size: 8.0 mm Number of attempts: 1 Airway Equipment and Method: stylet and Video-laryngoscopy Placement Confirmation: ETT inserted through vocal cords under direct vision,  positive ETCO2 and breath sounds checked- equal and bilateral Secured at: 18 cm Tube secured with: Tape Dental Injury: Teeth and Oropharynx as per pre-operative assessment  Future Recommendations: Recommend- induction with short-acting agent, and alternative techniques readily available

## 2016-08-25 NOTE — Interval H&P Note (Signed)
History and Physical Interval Note:  08/25/2016 2:56 PM  Robert Walters  has presented today for surgery, with the diagnosis of cholecystitis, cholelithiasis  The various methods of treatment have been discussed with the patient and family. After consideration of risks, benefits and other options for treatment, the patient has consented to  Procedure(s): LAPAROSCOPIC CHOLECYSTECTOMY WITH INTRAOPERATIVE CHOLANGIOGRAM (N/A) as a surgical intervention .  The patient's history has been reviewed, patient examined, no change in status, stable for surgery.  I have reviewed the patient's chart and labs.  Questions were answered to the patient's satisfaction.     [name]

## 2016-08-25 NOTE — Anesthesia Postprocedure Evaluation (Signed)
Anesthesia Post Note  Patient: Robert Walters  Procedure(s) Performed: Procedure(s) (LRB): LAPAROSCOPIC CHOLECYSTECTOMY (N/A)  Patient location during evaluation: PACU Anesthesia Type: General Level of consciousness: awake and patient cooperative Pain management: pain level controlled Vital Signs Assessment: post-procedure vital signs reviewed and stable Respiratory status: spontaneous breathing, nonlabored ventilation and respiratory function stable Cardiovascular status: blood pressure returned to baseline Postop Assessment: no signs of nausea or vomiting Anesthetic complications: no     Last Vitals:  Vitals:   08/25/16 1717 08/25/16 1730  BP: 122/73 123/60  Pulse: 88 82  Resp: 20 (!) 26  Temp: 36.6 C     Last Pain:  Vitals:   08/25/16 1730  TempSrc:   PainSc: 5                  Jamya Starry J

## 2016-08-25 NOTE — Anesthesia Preprocedure Evaluation (Signed)
Anesthesia Evaluation  Patient identified by MRN, date of birth, ID band Patient awake    Reviewed: Allergy & Precautions, NPO status , Patient's Chart, lab work & pertinent test results  Airway Mallampati: III  TM Distance: <3 FB   Mouth opening: Limited Mouth Opening  Dental  (+) Teeth Intact   Pulmonary neg pulmonary ROS,    breath sounds clear to auscultation       Cardiovascular hypertension, Pt. on medications + Peripheral Vascular Disease   Rhythm:Regular Rate:Normal     Neuro/Psych PSYCHIATRIC DISORDERS Bipolar Disorder TIACVA, Residual Symptoms    GI/Hepatic hiatal hernia, GERD  ,  Endo/Other  diabetes, Type 2, Oral Hypoglycemic Agents  Renal/GU      Musculoskeletal   Abdominal   Peds  Hematology   Anesthesia Other Findings   Reproductive/Obstetrics                             Anesthesia Physical Anesthesia Plan  ASA: III  Anesthesia Plan: General   Post-op Pain Management:    Induction: Intravenous, Rapid sequence and Cricoid pressure planned  Airway Management Planned: Oral ETT and Video Laryngoscope Planned  Additional Equipment:   Intra-op Plan:   Post-operative Plan: Extubation in OR  Informed Consent: I have reviewed the patients History and Physical, chart, labs and discussed the procedure including the risks, benefits and alternatives for the proposed anesthesia with the patient or authorized representative who has indicated his/her understanding and acceptance.     Plan Discussed with:   Anesthesia Plan Comments:         Anesthesia Quick Evaluation

## 2016-08-25 NOTE — Progress Notes (Addendum)
PROGRESS NOTE    Robert Walters  NID:[id number] DOB: 08-08-1955 DOA: 08/23/2016 PCP: Maximino Greenland, MD    Brief Narrative:  Robert Walters is a 61 y.o. male with medical history significant of DM, CVA in 3/17, HTN, HLD, GERD, and anxiety/depression presenting with fever and abdominal pain.  Sunday, he ate a bit of dinner at noon but didn\'t feel well Saturday and did not eat.  He has not eaten anything since yesterday noon - but has been drinking water.  He noticed symptoms on Friday, TTP in RUQ.  Difficulty to lie flat "because it would kind of choke me down a little bit."  Fever started last night, 100.5.  +chills/sweats.  +nausea, no vomiting (even after he tried to force himself to vomit).  No diarrhea, no BMs in the last 2 days.  Has not noticed any jaundice.  Mild heartburn, worse with lying down.   Assessment & Plan:   Principal Problem:   Sepsis (HCC) Active Problems:   HTN (hypertension)   Diabetes mellitus type 2 with complications (HCC)   Bipolar 1 disorder (HCC)   Cholecystitis   Thrombocytopenia (HCC)   Acute cholecystitis - RUQ ultrasound showing acute cholecystitis - f/u blood cultures; no growth to date.  - Continue with IV Zosyn - Procalcitonin of 1.04 -plan for cholecystectomy today.  -LFT normal on admission.   Sepsis;  Secondary to cholecystitis;  Continue with IV antibiotics. IV fluids.   AKI; in setting of hypovolemia, infection.  Will increase IV fluids.  Strict I and o.  Repeat labs in am.  Hold cozaar, didn\'t received it yesterday.   HTN -hold Cozaar due to AKI.  - Hold Lopressor and cover with IV Lopressor  DM -Hold Glucotrol and Glucophage while inpatient.  -Cover with SSI  Bipolar -Continue Prozac, Lamictal -Hold Xanax and give IV Ativan as needed  Thrombocytopenia -chronic and stable    DVT prophylaxis: SCDs Code Status: Full Family Communication: None present Disposition Plan:  Home once clinically  improved   Consultants:   General Surgery  Procedures:   lap chole 4-25  Antimicrobials:   Zosyn    Subjective: He report pain as moderate. Controlled with medication, nausea improved.  Awaiting to have sx.    Objective: Vitals:   08/25/16 1450 08/25/16 1455 08/25/16 1500 08/25/16 1505  BP: (!) 146/83 (!) 153/89 133/89 140/85  Pulse:      Resp: 18 20 13 13  Temp:      TempSrc:      SpO2:   92% 92%  Weight:      Height:        Intake/Output Summary (Last 24 hours) at 08/25/16 1625 Last data filed at 08/25/16 1600  Gross per 24 hour  Intake          4395.84 ml  Output             1075 ml  Net          33 20.84 ml   Filed Weights   08/23/16 1246  Weight: 99.8 kg (220 lb)    Examination:  General exam: NAD Respiratory system: CTA. Respiratory effort normal. Cardiovascular system: S1 & S2 heard, RRR. No JVD, murmurs, rubs, gallops or clicks.  Gastrointestinal system: Abdomen is distended, soft and mild tenderness right upper quadrant. No rigidity  No organomegaly or masses felt. Normal bowel sounds heard. Central nervous system: Alert and oriented. No focal deficit.  Extremities: Symmetric 5 x 5 power. Skin: No rashes, lesions  or ulcers Psychiatry: appropriate.     Data Reviewed: I have personally reviewed following labs and imaging studies  CBC:  Recent Labs Lab 08/23/16 1257 08/24/16 0439 08/25/16 0821  WBC 11.9* 9.8 9.5  NEUTROABS 10.5*  --   --   HGB 13.1 11.5* 10.5*  HCT 38.1* 35.0* 30.6*  MCV 90.1 92.6 90.5  PLT 129* 127* 811*   Basic Metabolic Panel:  Recent Labs Lab 08/23/16 1257 08/24/16 0439 08/25/16 0821  NA 132* 135 133*  K 3.9 4.2 3.6  CL 99* 101 99*  CO2 22 26 25   GLUCOSE 251* 212* 164*  BUN 19 17 24*  CREATININE 1.04 1.14 1.54*  CALCIUM 8.6* 8.0* 7.7*   GFR: Estimated Creatinine Clearance: 57.9 mL/min (A) (by C-G formula based on SCr of 1.54 mg/dL (H)). Liver Function Tests:  Recent Labs Lab 08/23/16 1257  AST  17  ALT 18  ALKPHOS 86  BILITOT 1.1  PROT 7.2  ALBUMIN 3.6   No results for input(s): LIPASE, AMYLASE in the last 168 hours. No results for input(s): AMMONIA in the last 168 hours. Coagulation Profile:  Recent Labs Lab 08/23/16 1958  INR 1.22   Cardiac Enzymes: No results for input(s): CKTOTAL, CKMB, CKMBINDEX, TROPONINI in the last 168 hours. BNP (last 3 results) No results for input(s): PROBNP in the last 8760 hours. HbA1C:  Recent Labs  08/23/16 1356  HGBA1C 6.4*   CBG:  Recent Labs Lab 08/24/16 1636 08/24/16 2150 08/25/16 0739 08/25/16 1102 08/25/16 1316  GLUCAP 213* 181* 149* 157* 155*   Lipid Profile: No results for input(s): CHOL, HDL, LDLCALC, TRIG, CHOLHDL, LDLDIRECT in the last 72 hours. Thyroid Function Tests: No results for input(s): TSH, T4TOTAL, FREET4, T3FREE, THYROIDAB in the last 72 hours. Anemia Panel: No results for input(s): VITAMINB12, FOLATE, FERRITIN, TIBC, IRON, RETICCTPCT in the last 72 hours. Sepsis Labs:  Recent Labs Lab 08/23/16 1329 08/23/16 1356 08/23/16 1958  PROCALCITON  --   --  1.04  LATICACIDVEN 1.25 1.3 0.9    Recent Results (from the past 240 hour(s))  Culture, blood (x 2)     Status: None (Preliminary result)   Collection Time: 08/23/16  1:56 PM  Result Value Ref Range Status   Specimen Description BLOOD RIGHT FOREARM  Final   Special Requests Blood Culture adequate volume  Final   Culture NO GROWTH 2 DAYS  Final   Report Status PENDING  Incomplete  Culture, Urine     Status: Abnormal   Collection Time: 08/23/16  6:24 PM  Result Value Ref Range Status   Specimen Description URINE, RANDOM  Final   Special Requests NONE  Final   Culture (A)  Final    <10,000 COLONIES/mL INSIGNIFICANT GROWTH Performed at [facility], [address] [place], [place] [address]    Report Status 08/25/2016 FINAL  Final  Culture, blood (x 2)     Status: None (Preliminary result)   Collection Time: 08/23/16  8:03 PM   Result Value Ref Range Status   Specimen Description LEFT ANTECUBITAL  Final   Special Requests Blood Culture adequate volume  Final   Culture NO GROWTH 2 DAYS  Final   Report Status PENDING  Incomplete  Surgical pcr screen     Status: None   Collection Time: 08/24/16  9:33 PM  Result Value Ref Range Status   MRSA, PCR NEGATIVE NEGATIVE Final   Staphylococcus aureus NEGATIVE NEGATIVE Final    Comment:  The Xpert SA Assay (FDA approved for NASAL specimens in patients over 70 years of age), is one component of a comprehensive surveillance program.  Test performance has been validated by [facility] for patients greater than or equal to 81 year old. It is not intended to diagnose infection nor to guide or monitor treatment.          Radiology Studies: US Abdomen Limited Ruq  Result Date: 08/24/2016 CLINICAL DATA:  Cholecystitis.  Questionable bile duct dilation. EXAM: US ABDOMEN LIMITED - RIGHT UPPER QUADRANT COMPARISON:  CT, 08/23/2016 FINDINGS: Gallbladder: Wall is thickened to 1 cm. Small nonshadowing stone in the gallbladder neck measuring 7 mm. Some sludge dependently. No sonographic Murphy's sign. No pericholecystic fluid. Common bile duct: Diameter: 10 mm.  No duct stone visualized. Liver: Increased parenchymal echogenicity.  No mass or focal lesion. IMPRESSION: 1. Findings are consistent with acute cholecystitis with gallbladder wall thickening a small amount of sludge in 1 small stone. 2. Proximal common bile duct is dilated to 1 cm. No duct stone or sludge is seen. 3. Hepatic steatosis. Electronically Signed   By: Lajean Manes M.D.   On: 08/24/2016 09:16        Scheduled Meds: . Chlorhexidine Gluconate Cloth  6 each Topical Once  . [MAR Hold] FLUoxetine  20 mg Oral Daily  . [MAR Hold] insulin aspart  0-15 Units Subcutaneous TID WC  . [MAR Hold] lamoTRIgine  400 mg Oral QHS  . [MAR Hold] metoprolol  5 mg Intravenous Q12H  . [MAR Hold] tamsulosin  0.4 mg  Oral BID   Continuous Infusions: . sodium chloride 125 mL/hr at 08/25/16 1247  . lactated ringers 75 mL/hr at 08/25/16 1449  . [MAR Hold] piperacillin-tazobactam (ZOSYN)  IV Stopped (08/25/16 1151)     LOS: 2 days    Time spent: 30 minutes    Elmarie Shiley, MD [facility] Pager [phone]  If 7PM-7AM, please contact night-coverage [email or web address] Password [facility] 08/25/2016, 4:25 PM

## 2016-08-25 NOTE — Transfer of Care (Signed)
Immediate Anesthesia Transfer of Care Note  Patient: Robert Walters  Procedure(s) Performed: Procedure(s): LAPAROSCOPIC CHOLECYSTECTOMY (N/A)  Patient Location: PACU  Anesthesia Type:General  Level of Consciousness: awake and patient cooperative  Airway & Oxygen Therapy: Patient Spontanous Breathing and Patient connected to face mask oxygen  Post-op Assessment: Report given to RN, Post -op Vital signs reviewed and stable and Patient moving all extremities  Post vital signs: Reviewed and stable  Last Vitals:  Vitals:   08/25/16 1500 08/25/16 1505  BP: 133/89 140/85  Pulse:    Resp: 13 13  Temp:      Last Pain:  Vitals:   08/25/16 1327  TempSrc: Oral  PainSc: 3       Patients Stated Pain Goal: 4 (44/46/19 0122)  Complications: No apparent anesthesia complications

## 2016-08-26 LAB — BASIC METABOLIC PANEL
Anion gap: 10 (ref 5–15)
BUN: 29 mg/dL — AB (ref 6–20)
CALCIUM: 7.3 mg/dL — AB (ref 8.9–10.3)
CO2: 25 mmol/L (ref 22–32)
CREATININE: 1.53 mg/dL — AB (ref 0.61–1.24)
Chloride: 102 mmol/L (ref 101–111)
GFR calc Af Amer: 55 mL/min — ABNORMAL LOW (ref >60)
GFR, EST NON AFRICAN AMERICAN: 47 mL/min — AB (ref >60)
Glucose, Bld: 197 mg/dL — ABNORMAL HIGH (ref 65–99)
POTASSIUM: 4.2 mmol/L (ref 3.5–5.1)
SODIUM: 137 mmol/L (ref 135–145)

## 2016-08-26 LAB — CBC
HCT: 30 % — ABNORMAL LOW (ref 39.0–52.0)
Hemoglobin: 10.2 g/dL — ABNORMAL LOW (ref 13.0–17.0)
MCH: 31.3 pg (ref 26.0–34.0)
MCHC: 34 g/dL (ref 30.0–36.0)
MCV: 92 fL (ref 78.0–100.0)
PLATELETS: 133 10*3/uL — AB (ref 150–400)
RBC: 3.26 MIL/uL — AB (ref 4.22–5.81)
RDW: 14.6 % (ref 11.5–15.5)
WBC: 7.7 10*3/uL (ref 4.0–10.5)

## 2016-08-26 LAB — GLUCOSE, CAPILLARY
GLUCOSE-CAPILLARY: 186 mg/dL — AB (ref 65–99)
GLUCOSE-CAPILLARY: 193 mg/dL — AB (ref 65–99)
Glucose-Capillary: 239 mg/dL — ABNORMAL HIGH (ref 65–99)
Glucose-Capillary: 291 mg/dL — ABNORMAL HIGH (ref 65–99)

## 2016-08-26 LAB — HEPATIC FUNCTION PANEL
ALT: 95 U/L — ABNORMAL HIGH (ref 17–63)
AST: 73 U/L — ABNORMAL HIGH (ref 15–41)
Albumin: 2.2 g/dL — ABNORMAL LOW (ref 3.5–5.0)
Alkaline Phosphatase: 127 U/L — ABNORMAL HIGH (ref 38–126)
BILIRUBIN DIRECT: 0.4 mg/dL (ref 0.1–0.5)
BILIRUBIN INDIRECT: 0.5 mg/dL (ref 0.3–0.9)
BILIRUBIN TOTAL: 0.9 mg/dL (ref 0.3–1.2)
Total Protein: 5.4 g/dL — ABNORMAL LOW (ref 6.5–8.1)

## 2016-08-26 LAB — PHOSPHORUS: Phosphorus: 4 mg/dL (ref 2.5–4.6)

## 2016-08-26 LAB — MAGNESIUM: MAGNESIUM: 1.8 mg/dL (ref 1.7–2.4)

## 2016-08-26 MED ORDER — METOPROLOL TARTRATE 25 MG PO TABS: 25.0000 mg | ORAL_TABLET | Freq: Two times a day (BID) | ORAL | Status: DC

## 2016-08-26 MED ORDER — KCL IN DEXTROSE-NACL 20-5-0.45 MEQ/L-%-% IV SOLN
INTRAVENOUS | Status: DC
  Administered 2016-08-26: 09:00:00 via INTRAVENOUS

## 2016-08-26 MED ORDER — METOPROLOL TARTRATE 25 MG PO TABS
25.0000 mg | ORAL_TABLET | Freq: Two times a day (BID) | ORAL | Status: DC
  Administered 2016-08-27 – 2016-08-31 (×9): 25 mg via ORAL
  Filled 2016-08-26 (×9): qty 1

## 2016-08-26 MED ORDER — SODIUM CHLORIDE 0.9 % IV BOLUS (SEPSIS)
500.0000 mL | Freq: Once | INTRAVENOUS | Status: AC
  Administered 2016-08-26: 500 mL via INTRAVENOUS

## 2016-08-26 MED ORDER — PHENOL 1.4 % MT LIQD
1.0000 | OROMUCOSAL | Status: DC | PRN
  Administered 2016-08-26: 1 via OROMUCOSAL
  Filled 2016-08-26: qty 177

## 2016-08-26 NOTE — Addendum Note (Signed)
Addendum  created 08/26/16 1445 by Charmaine Downs, CRNA   Sign clinical note

## 2016-08-26 NOTE — Progress Notes (Signed)
PROGRESS NOTE    Robert Walters  MYT:[id number] DOB: Dec 23, 1955 DOA: 08/23/2016 PCP: Maximino Greenland, MD    Brief Narrative:  Robert Walters is a 61 y.o. male with medical history significant of DM, CVA in 3/17, HTN, HLD, GERD, and anxiety/depression presenting with fever and abdominal pain.  Sunday, he ate a bit of dinner at noon but didn\'t feel well Saturday and did not eat.  He has not eaten anything since yesterday noon - but has been drinking water.  He noticed symptoms on Friday, TTP in RUQ.  Difficulty to lie flat "because it would kind of choke me down a little bit."  Fever started last night, 100.5.  +chills/sweats.  +nausea, no vomiting (even after he tried to force himself to vomit).  No diarrhea, no BMs in the last 2 days.  Has not noticed any jaundice.  Mild heartburn, worse with lying down.   Assessment & Plan:   Principal Problem:   Sepsis (HCC) Active Problems:   HTN (hypertension)   Diabetes mellitus type 2 with complications (HCC)   Bipolar 1 disorder (HCC)   Cholecystitis   Thrombocytopenia (HCC)   Acute cholecystitis, gangrenous gallbladder, empyema.  - RUQ ultrasound showing acute cholecystitis - blood cultures; no growth to date.  - Continue with IV Zosyn - Procalcitonin of 1.04 -S/P cholecystectomy 4-25 -LFT normal on admission.  Pain improved, tolerating diet, passing flatus.   Sepsis;  Secondary to cholecystitis;  Continue with IV antibiotics. IV fluids.   AKI; in setting of hypovolemia, infection.  Continue with IV fluids.  Strict I and o.  Cr stable. Repeat labs in am.  Hold cozaar, didn\'t received it yesterday.   HTN -hold Cozaar due to AKI.  -resume oral metoprolol.   DM -Hold Glucotrol and Glucophage while inpatient.  -Cover with SSI  Bipolar -Continue Prozac, Lamictal -Hold Xanax and give IV Ativan as needed  Thrombocytopenia -chronic and stable    DVT prophylaxis: SCDs Code Status: Full Family Communication: None  present Disposition Plan:  Home once clinically improved   Consultants:   General Surgery  Procedures:   lap chole 4-25  Antimicrobials:   Zosyn    Subjective: Feeling better, soreness improved. Tolerating diet, passing flatus    Objective: Vitals:   08/25/16 1809 08/25/16 1948 08/25/16 2103 08/26/16 0430  BP: (!) 108/55  107/64 101/74  Pulse: 86  75 78  Resp: 16  16 18  Temp: 98.5 F (36.9 C)  98.8 F (37.1 C) 98.6 F (37 C)  TempSrc:   Oral Oral  SpO2: 95% 94% 98% 98%  Weight:      Height:        Intake/Output Summary (Last 24 hours) at 08/26/16 1414 Last data filed at 08/26/16 0900  Gross per 24 hour  Intake          2562.08 ml  Output              840 ml  Net          17 22.08 ml   Filed Weights   08/23/16 1246  Weight: 99.8 kg (220 lb)    Examination:  General exam: NAD Respiratory system: CTA. Respiratory effort normal. Cardiovascular system: S1 & S2 heard, RRR. No JVD, murmurs, rubs, gallops or clicks.  Gastrointestinal system: Abdomen is distended, soft and mild tenderness right upper quadrant. No rigidity  No organomegaly or masses felt. Normal bowel sounds heard. Multiples abdominal wall incision with clean dressing, perc drain right side.  Central nervous system: Alert and oriented. No focal deficit.  Extremities: Symmetric 5 x 5 power. Skin: No rashes, lesions or ulcers Psychiatry: appropriate.     Data Reviewed: I have personally reviewed following labs and imaging studies  CBC:  Recent Labs Lab 08/23/16 1257 08/24/16 0439 08/25/16 0821 08/26/16 0457  WBC 11.9* 9.8 9.5 7.7  NEUTROABS 10.5*  --   --   --   HGB 13.1 11.5* 10.5* 10.2*  HCT 38.1* 35.0* 30.6* 30.0*  MCV 90.1 92.6 90.5 92.0  PLT 129* 127* 128* 470*   Basic Metabolic Panel:  Recent Labs Lab 08/23/16 1257 08/24/16 0439 08/25/16 0821 08/26/16 0457  NA 132* 135 133* 137  K 3.9 4.2 3.6 4.2  CL 99* 101 99* 102  CO2 22 26 25 25   GLUCOSE 251* 212* 164* 197*    BUN 19 17 24* 29*  CREATININE 1.04 1.14 1.54* 1.53*  CALCIUM 8.6* 8.0* 7.7* 7.3*  MG  --   --   --  1.8  PHOS  --   --   --  4.0   GFR: Estimated Creatinine Clearance: 58.3 mL/min (A) (by C-G formula based on SCr of 1.53 mg/dL (H)). Liver Function Tests:  Recent Labs Lab 08/23/16 1257 08/26/16 0457  AST 17 73*  ALT 18 95*  ALKPHOS 86 127*  BILITOT 1.1 0.9  PROT 7.2 5.4*  ALBUMIN 3.6 2.2*   No results for input(s): LIPASE, AMYLASE in the last 168 hours. No results for input(s): AMMONIA in the last 168 hours. Coagulation Profile:  Recent Labs Lab 08/23/16 1958  INR 1.22   Cardiac Enzymes: No results for input(s): CKTOTAL, CKMB, CKMBINDEX, TROPONINI in the last 168 hours. BNP (last 3 results) No results for input(s): PROBNP in the last 8760 hours. HbA1C: No results for input(s): HGBA1C in the last 72 hours. CBG:  Recent Labs Lab 08/25/16 1102 08/25/16 1316 08/25/16 1721 08/26/16 0754 08/26/16 1124  GLUCAP 157* 155* 188* 186* 239*   Lipid Profile: No results for input(s): CHOL, HDL, LDLCALC, TRIG, CHOLHDL, LDLDIRECT in the last 72 hours. Thyroid Function Tests: No results for input(s): TSH, T4TOTAL, FREET4, T3FREE, THYROIDAB in the last 72 hours. Anemia Panel: No results for input(s): VITAMINB12, FOLATE, FERRITIN, TIBC, IRON, RETICCTPCT in the last 72 hours. Sepsis Labs:  Recent Labs Lab 08/23/16 1329 08/23/16 1356 08/23/16 1958  PROCALCITON  --   --  1.04  LATICACIDVEN 1.25 1.3 0.9    Recent Results (from the past 240 hour(s))  Culture, blood (x 2)     Status: None (Preliminary result)   Collection Time: 08/23/16  1:56 PM  Result Value Ref Range Status   Specimen Description BLOOD RIGHT FOREARM  Final   Special Requests Blood Culture adequate volume  Final   Culture NO GROWTH 3 DAYS  Final   Report Status PENDING  Incomplete  Culture, Urine     Status: Abnormal   Collection Time: 08/23/16  6:24 PM  Result Value Ref Range Status   Specimen  Description URINE, RANDOM  Final   Special Requests NONE  Final   Culture (A)  Final    <10,000 COLONIES/mL INSIGNIFICANT GROWTH Performed at [facility], [address] [place], [place] [address]    Report Status 08/25/2016 FINAL  Final  Culture, blood (x 2)     Status: None (Preliminary result)   Collection Time: 08/23/16  8:03 PM  Result Value Ref Range Status   Specimen Description LEFT ANTECUBITAL  Final  Special Requests Blood Culture adequate volume  Final   Culture NO GROWTH 3 DAYS  Final   Report Status PENDING  Incomplete  Surgical pcr screen     Status: None   Collection Time: 08/24/16  9:33 PM  Result Value Ref Range Status   MRSA, PCR NEGATIVE NEGATIVE Final   Staphylococcus aureus NEGATIVE NEGATIVE Final    Comment:        The Xpert SA Assay (FDA approved for NASAL specimens in patients over 11 years of age), is one component of a comprehensive surveillance program.  Test performance has been validated by [facility] for patients greater than or equal to 62 year old. It is not intended to diagnose infection nor to guide or monitor treatment.          Radiology Studies: No results found.      Scheduled Meds: . FLUoxetine  20 mg Oral Daily  . insulin aspart  0-15 Units Subcutaneous TID WC  . lamoTRIgine  400 mg Oral QHS  . metoprolol  5 mg Intravenous Q12H  . tamsulosin  0.4 mg Oral BID   Continuous Infusions: . sodium chloride Stopped (08/26/16 0940)  . piperacillin-tazobactam (ZOSYN)  IV Stopped (08/26/16 1204)     LOS: 3 days    Time spent: 30 minutes    Elmarie Shiley, MD [facility] Pager [phone]  If 7PM-7AM, please contact night-coverage [email or web address] Password [facility] 08/26/2016, 2:14 PM

## 2016-08-26 NOTE — Progress Notes (Signed)
1 Day Post-Op  Subjective: Patient has minimal incisional pain. He states his preoperative pain has resolved.  Objective: Vital signs in last 24 hours: Temp:  [97.9 F (36.6 C)-99.3 F (37.4 C)] 98.6 F (37 C) (04/26 0430) Pulse Rate:  [75-88] 78 (04/26 0430) Resp:  [13-26] 18 (04/26 0430) BP: (101-153)/(55-89) 101/74 (04/26 0430) SpO2:  [91 %-98 %] 98 % (04/26 0430) Last BM Date: 08/22/16  Intake/Output from previous day: 04/25 0701 - 04/26 0700 In: 2202.1 [I.V.:2152.1; IV Piggyback:50] Out: 990 [Urine:500; Drains:265; Blood:225] Intake/Output this shift: Total I/O In: -  Out: 350 [Urine:350]  General appearance: alert, cooperative and no distress Resp: clear to auscultation bilaterally Cardio: regular rate and rhythm, S1, S2 normal, no murmur, click, rub or gallop GI: Soft, incisions healing well. JP drainage serosanguineous in nature. No bile present.  Lab Results:   Recent Labs  08/25/16 0821 08/26/16 0457  WBC 9.5 7.7  HGB 10.5* 10.2*  HCT 30.6* 30.0*  PLT 128* 133*   BMET  Recent Labs  08/25/16 0821 08/26/16 0457  NA 133* 137  K 3.6 4.2  CL 99* 102  CO2 25 25  GLUCOSE 164* 197*  BUN 24* 29*  CREATININE 1.54* 1.53*  CALCIUM 7.7* 7.3*   PT/INR  Recent Labs  08/23/16 1958  LABPROT 15.5*  INR 1.22    Studies/Results: US Abdomen Limited Ruq  Result Date: 08/24/2016 CLINICAL DATA:  Cholecystitis.  Questionable bile duct dilation. EXAM: US ABDOMEN LIMITED - RIGHT UPPER QUADRANT COMPARISON:  CT, 08/23/2016 FINDINGS: Gallbladder: Wall is thickened to 1 cm. Small nonshadowing stone in the gallbladder neck measuring 7 mm. Some sludge dependently. No sonographic Murphy's sign. No pericholecystic fluid. Common bile duct: Diameter: 10 mm.  No duct stone visualized. Liver: Increased parenchymal echogenicity.  No mass or focal lesion. IMPRESSION: 1. Findings are consistent with acute cholecystitis with gallbladder wall thickening a small amount of sludge in 1  small stone. 2. Proximal common bile duct is dilated to 1 cm. No duct stone or sludge is seen. 3. Hepatic steatosis. Electronically Signed   By: Lajean Manes M.D.   On: 08/24/2016 09:16    Anti-infectives: Anti-infectives    Start     Dose/Rate Route Frequency Ordered Stop   08/23/16 2200  piperacillin-tazobactam (ZOSYN) IVPB 3.375 g     3.375 g 12.5 mL/hr over 240 Minutes Intravenous Every 8 hours 08/23/16 1857     08/23/16 1615  piperacillin-tazobactam (ZOSYN) IVPB 3.375 g     3.375 g 100 mL/hr over 30 Minutes Intravenous  Once 08/23/16 1607 08/23/16 1733      Assessment/Plan: s/p Procedure(s): LAPAROSCOPIC CHOLECYSTECTOMY Impression: Stable on postoperative day 1. Will advance diet as tolerated. Continue IV antibiotics. Adjust IV fluids.  LOS: 3 days    [name] 08/26/2016

## 2016-08-26 NOTE — Anesthesia Postprocedure Evaluation (Signed)
Anesthesia Post Note  Patient: Robert Walters  Procedure(s) Performed: Procedure(s) (LRB): LAPAROSCOPIC CHOLECYSTECTOMY (N/A)  Patient location during evaluation: Nursing Unit Anesthesia Type: General Level of consciousness: awake and alert, oriented and patient cooperative Pain management: pain level controlled Vital Signs Assessment: post-procedure vital signs reviewed and stable Respiratory status: spontaneous breathing, nonlabored ventilation and respiratory function stable Cardiovascular status: blood pressure returned to baseline Postop Assessment: no signs of nausea or vomiting Anesthetic complications: no     Last Vitals:  Vitals:   08/25/16 2103 08/26/16 0430  BP: 107/64 101/74  Pulse: 75 78  Resp: 16 18  Temp: 37.1 C 37 C    Last Pain:  Vitals:   08/26/16 1150  TempSrc:   PainSc: 0-No pain                 Robert Walters

## 2016-08-26 NOTE — Progress Notes (Signed)
Pharmacy Antibiotic Note  Robert Walters is a 61 y.o. male admitted on 08/23/2016 with Intra-abdominal Infection.  Pharmacy has been consulted for Zosyn  dosing. Post op Day#1 for lap chole. Afebrile, WBC normal, pain improved,  incisions healing well. JP drainage serosanguineous in nature. No bile present.  Plan: Continue Zosyn 3.375gm IV every 8 hours. Deescalate to PO therapy as indicated  Height: 5\' 9"  (175.3 cm) Weight: 220 lb (99.8 kg) IBW/kg (Calculated) : 70.7  Temp (24hrs), Avg:98.6 F (37 C), Min:97.9 F (36.6 C), Max:99.3 F (37.4 C)   Recent Labs Lab 08/23/16 1257 08/23/16 1329 08/23/16 1356 08/23/16 1958 08/24/16 0439 08/25/16 0821 08/26/16 0457  WBC 11.9*  --   --   --  9.8 9.5 7.7  CREATININE 1.04  --   --   --  1.14 1.54* 1.53*  LATICACIDVEN  --  1.25 1.3 0.9  --   --   --     Estimated Creatinine Clearance: 58.3 mL/min (A) (by C-G formula based on SCr of 1.53 mg/dL (H)).    No Known Allergies  Antimicrobials this admission:  Zosyn 4/23 >>   Dose adjustments this admission:  n/a   Microbiology results:  4/23 BCx: ngtd 4/23 UCx: insignificant growth 4.23 MRSA PCR is negative 4/23 HIV: nonreative  Thank you for allowing pharmacy to be a part of this patient's care.  Isac Sarna, BS Pharm D, [place] Clinical Pharmacist Pager [phone] 08/26/2016 11:20 AM

## 2016-08-26 NOTE — Progress Notes (Signed)
Patient unable to void since I&O cath is am, MD informed and orders given.

## 2016-08-26 NOTE — Progress Notes (Signed)
Patient had in and out cath with a return of 350cc amber urine.

## 2016-08-27 ENCOUNTER — Encounter (HOSPITAL_COMMUNITY): Payer: Self-pay | Admitting: General Surgery

## 2016-08-27 LAB — CBC
HEMATOCRIT: 28 % — AB (ref 39.0–52.0)
Hemoglobin: 9.4 g/dL — ABNORMAL LOW (ref 13.0–17.0)
MCH: 30.8 pg (ref 26.0–34.0)
MCHC: 33.6 g/dL (ref 30.0–36.0)
MCV: 91.8 fL (ref 78.0–100.0)
Platelets: 137 10*3/uL — ABNORMAL LOW (ref 150–400)
RBC: 3.05 MIL/uL — AB (ref 4.22–5.81)
RDW: 14.4 % (ref 11.5–15.5)
WBC: 5.8 10*3/uL (ref 4.0–10.5)

## 2016-08-27 LAB — GLUCOSE, CAPILLARY
GLUCOSE-CAPILLARY: 253 mg/dL — AB (ref 65–99)
Glucose-Capillary: 177 mg/dL — ABNORMAL HIGH (ref 65–99)
Glucose-Capillary: 196 mg/dL — ABNORMAL HIGH (ref 65–99)
Glucose-Capillary: 153 mg/dL — ABNORMAL HIGH (ref 65–99)

## 2016-08-27 LAB — BASIC METABOLIC PANEL
ANION GAP: 9 (ref 5–15)
BUN: 16 mg/dL (ref 6–20)
CHLORIDE: 102 mmol/L (ref 101–111)
CO2: 25 mmol/L (ref 22–32)
CREATININE: 1.17 mg/dL (ref 0.61–1.24)
Calcium: 7.4 mg/dL — ABNORMAL LOW (ref 8.9–10.3)
GFR calc non Af Amer: >60 mL/min (ref >60)
Glucose, Bld: 180 mg/dL — ABNORMAL HIGH (ref 65–99)
Potassium: 3.9 mmol/L (ref 3.5–5.1)
SODIUM: 136 mmol/L (ref 135–145)

## 2016-08-27 LAB — HEPATIC FUNCTION PANEL
ALBUMIN: 2.3 g/dL — AB (ref 3.5–5.0)
ALK PHOS: 110 U/L (ref 38–126)
ALT: 73 U/L — ABNORMAL HIGH (ref 17–63)
AST: 37 U/L (ref 15–41)
Bilirubin, Direct: 0.2 mg/dL (ref 0.1–0.5)
Indirect Bilirubin: 0.5 mg/dL (ref 0.3–0.9)
TOTAL PROTEIN: 5.6 g/dL — AB (ref 6.5–8.1)
Total Bilirubin: 0.7 mg/dL (ref 0.3–1.2)

## 2016-08-27 NOTE — Progress Notes (Signed)
2 Days Post-Op  Subjective: Patient having moderate incisional pain. His voiding has improved. No nausea or vomiting noted.  Objective: Vital signs in last 24 hours: Temp:  [97.2 F (36.2 C)-98.2 F (36.8 C)] 97.7 F (36.5 C) (04/27 0437) Pulse Rate:  [81-85] 81 (04/27 0437) Resp:  [18-20] 20 (04/27 0437) BP: (131-139)/(75-80) 139/80 (04/27 0437) SpO2:  [88 %-98 %] 96 % (04/27 0437) Last BM Date: 08/24/16  Intake/Output from previous day: 04/26 0701 - 04/27 0700 In: 3292.9 [P.O.:600; I.V.:2492.9; IV Piggyback:200] Out: 945 [Urine:850; Drains:95] Intake/Output this shift: Total I/O In: -  Out: 300 [Urine:300]  General appearance: alert, cooperative and no distress Resp: clear to auscultation bilaterally Cardio: regular rate and rhythm, S1, S2 normal, no murmur, click, rub or gallop GI: Soft, incisions healing well. JP drainage serosanguineous with some bile present.  Lab Results:   Recent Labs  08/26/16 0457 08/27/16 0705  WBC 7.7 5.8  HGB 10.2* 9.4*  HCT 30.0* 28.0*  PLT 133* 137*   BMET  Recent Labs  08/26/16 0457 08/27/16 0705  NA 137 136  K 4.2 3.9  CL 102 102  CO2 25 25  GLUCOSE 197* 180*  BUN 29* 16  CREATININE 1.53* 1.17  CALCIUM 7.3* 7.4*   PT/INR No results for input(s): LABPROT, INR in the last 72 hours.  Studies/Results: No results found.  Anti-infectives: Anti-infectives    Start     Dose/Rate Route Frequency Ordered Stop   08/23/16 2200  piperacillin-tazobactam (ZOSYN) IVPB 3.375 g     3.375 g 12.5 mL/hr over 240 Minutes Intravenous Every 8 hours 08/23/16 1857     08/23/16 1615  piperacillin-tazobactam (ZOSYN) IVPB 3.375 g     3.375 g 100 mL/hr over 30 Minutes Intravenous  Once 08/23/16 1607 08/23/16 1733      Assessment/Plan: s/p Procedure(s): LAPAROSCOPIC CHOLECYSTECTOMY Impression: Stable on postoperative day 2. Patient doing remarkably well given intraoperative findings. Liver tests are appropriate given the gangrenous  gallbladder findings. Will start ambulating patient. Continue IV Zosyn.  LOS: 4 days    [name] 08/27/2016

## 2016-08-27 NOTE — Care Management Important Message (Signed)
Important Message  Patient Details  Name: Robert Walters MRN: [id number] Date of Birth: 1956/02/20   Medicare Important Message Given:  Yes    Sherald Barge, RN 08/27/2016, 12:58 PM

## 2016-08-27 NOTE — Progress Notes (Signed)
PROGRESS NOTE    Robert Walters  HYI:[id number] DOB: May 06, 1955 DOA: 08/23/2016 PCP: Maximino Greenland, MD    Brief Narrative:  Robert Walters is a 61 y.o. male with medical history significant of DM, CVA in 3/17, HTN, HLD, GERD, and anxiety/depression presenting with fever and abdominal pain.  Sunday, he ate a bit of dinner at noon but didn\'t feel well Saturday and did not eat.  He has not eaten anything since yesterday noon - but has been drinking water.  He noticed symptoms on Friday, TTP in RUQ.  Difficulty to lie flat "because it would kind of choke me down a little bit."  Fever started last night, 100.5.  +chills/sweats.  +nausea, no vomiting (even after he tried to force himself to vomit).  No diarrhea, no BMs in the last 2 days.  Has not noticed any jaundice.  Mild heartburn, worse with lying down.   Assessment & Plan:   Principal Problem:   Sepsis (HCC) Active Problems:   HTN (hypertension)   Diabetes mellitus type 2 with complications (HCC)   Bipolar 1 disorder (HCC)   Cholecystitis   Thrombocytopenia (HCC)   Acute cholecystitis, gangrenous gallbladder, empyema.  - RUQ ultrasound showing acute cholecystitis - blood cultures; no growth to date.  - Continue with IV Zosyn - Procalcitonin of 1.04 -S/P cholecystectomy 4-25 -LFT normal on admission.  Pain improved, tolerating diet, passing flatus, no bowel movement.   Sepsis;  Secondary to cholecystitis;  Continue with IV antibiotics. IV fluids.   AKI; in setting of hypovolemia, infection.  Continue with IV fluids.  Improving. Cr trending down 1.1 today  Hold cozaar, due to AKI.   HTN -hold Cozaar due to AKI.  -resume oral metoprolol.   DM -Hold Glucotrol and Glucophage while inpatient.  -Cover with SSI  Bipolar -Continue Prozac, Lamictal -Hold Xanax and give IV Ativan as needed  Thrombocytopenia -chronic and stable    DVT prophylaxis: SCDs Code Status: Full Family Communication: None  present Disposition Plan:  Home once clinically improved   Consultants:   General Surgery  Procedures:   lap chole 4-25  Antimicrobials:   Zosyn    Subjective: Abdominal soreness on ambulation.  No BM, passing flatus.   Objective: Vitals:   08/26/16 1957 08/26/16 2155 08/27/16 0437 08/27/16 1121  BP:  131/77 139/80   Pulse:  85 81   Resp:  18 20   Temp:  98.2 F (36.8 C) 97.7 F (36.5 C)   TempSrc:  Oral Oral   SpO2: (!) 88% 96% 96% 92%  Weight:      Height:        Intake/Output Summary (Last 24 hours) at 08/27/16 1300 Last data filed at 08/27/16 1020  Gross per 24 hour  Intake          2932.92 ml  Output             1095 ml  Net          18 37.92 ml   Filed Weights   08/23/16 1246  Weight: 99.8 kg (220 lb)    Examination:  General exam: NAD Respiratory system: CTA. Respiratory effort normal. Cardiovascular system: S1 & S2 heard, RRR. No JVD, murmurs, rubs, gallops or clicks.  Gastrointestinal system: Abdomen is distended, soft and mild tenderness right upper quadrant. No rigidity  No organomegaly or masses felt. Normal bowel sounds heard. Multiples abdominal wall incision with clean dressing, perc drain right side.  Central nervous system: Alert and oriented. No  focal deficit.  Extremities: Symmetric 5 x 5 power. Skin: No rashes, lesions or ulcers Psychiatry: appropriate.     Data Reviewed: I have personally reviewed following labs and imaging studies  CBC:  Recent Labs Lab 08/23/16 1257 08/24/16 0439 08/25/16 0821 08/26/16 0457 08/27/16 0705  WBC 11.9* 9.8 9.5 7.7 5.8  NEUTROABS 10.5*  --   --   --   --   HGB 13.1 11.5* 10.5* 10.2* 9.4*  HCT 38.1* 35.0* 30.6* 30.0* 28.0*  MCV 90.1 92.6 90.5 92.0 91.8  PLT 129* 127* 128* 133* 914*   Basic Metabolic Panel:  Recent Labs Lab 08/23/16 1257 08/24/16 0439 08/25/16 0821 08/26/16 0457 08/27/16 0705  NA 132* 135 133* 137 136  K 3.9 4.2 3.6 4.2 3.9  CL 99* 101 99* 102 102  CO2 22 26 25  25 25   GLUCOSE 251* 212* 164* 197* 180*  BUN 19 17 24* 29* 16  CREATININE 1.04 1.14 1.54* 1.53* 1.17  CALCIUM 8.6* 8.0* 7.7* 7.3* 7.4*  MG  --   --   --  1.8  --   PHOS  --   --   --  4.0  --    GFR: Estimated Creatinine Clearance: 76.2 mL/min (by C-G formula based on SCr of 1.17 mg/dL). Liver Function Tests:  Recent Labs Lab 08/23/16 1257 08/26/16 0457 08/27/16 0705  AST 17 73* 37  ALT 18 95* 73*  ALKPHOS 86 127* 110  BILITOT 1.1 0.9 0.7  PROT 7.2 5.4* 5.6*  ALBUMIN 3.6 2.2* 2.3*   No results for input(s): LIPASE, AMYLASE in the last 168 hours. No results for input(s): AMMONIA in the last 168 hours. Coagulation Profile:  Recent Labs Lab 08/23/16 1958  INR 1.22   Cardiac Enzymes: No results for input(s): CKTOTAL, CKMB, CKMBINDEX, TROPONINI in the last 168 hours. BNP (last 3 results) No results for input(s): PROBNP in the last 8760 hours. HbA1C: No results for input(s): HGBA1C in the last 72 hours. CBG:  Recent Labs Lab 08/26/16 1124 08/26/16 1633 08/26/16 2201 08/27/16 0730 08/27/16 1110  GLUCAP 239* 291* 193* 177* 196*   Lipid Profile: No results for input(s): CHOL, HDL, LDLCALC, TRIG, CHOLHDL, LDLDIRECT in the last 72 hours. Thyroid Function Tests: No results for input(s): TSH, T4TOTAL, FREET4, T3FREE, THYROIDAB in the last 72 hours. Anemia Panel: No results for input(s): VITAMINB12, FOLATE, FERRITIN, TIBC, IRON, RETICCTPCT in the last 72 hours. Sepsis Labs:  Recent Labs Lab 08/23/16 1329 08/23/16 1356 08/23/16 1958  PROCALCITON  --   --  1.04  LATICACIDVEN 1.25 1.3 0.9    Recent Results (from the past 240 hour(s))  Culture, blood (x 2)     Status: None (Preliminary result)   Collection Time: 08/23/16  1:56 PM  Result Value Ref Range Status   Specimen Description BLOOD RIGHT FOREARM  Final   Special Requests Blood Culture adequate volume  Final   Culture NO GROWTH 4 DAYS  Final   Report Status PENDING  Incomplete  Culture, Urine     Status:  Abnormal   Collection Time: 08/23/16  6:24 PM  Result Value Ref Range Status   Specimen Description URINE, RANDOM  Final   Special Requests NONE  Final   Culture (A)  Final    <10,000 COLONIES/mL INSIGNIFICANT GROWTH Performed at [facility], [address], [place],  [address]    Report Status 08/25/2016 FINAL  Final  Culture, blood (x 2)     Status: None (Preliminary result)  Collection Time: 08/23/16  8:03 PM  Result Value Ref Range Status   Specimen Description LEFT ANTECUBITAL  Final   Special Requests Blood Culture adequate volume  Final   Culture NO GROWTH 4 DAYS  Final   Report Status PENDING  Incomplete  Surgical pcr screen     Status: None   Collection Time: 08/24/16  9:33 PM  Result Value Ref Range Status   MRSA, PCR NEGATIVE NEGATIVE Final   Staphylococcus aureus NEGATIVE NEGATIVE Final    Comment:        The Xpert SA Assay (FDA approved for NASAL specimens in patients over 24 years of age), is one component of a comprehensive surveillance program.  Test performance has been validated by [facility] for patients greater than or equal to 91 year old. It is not intended to diagnose infection nor to guide or monitor treatment.          Radiology Studies: No results found.      Scheduled Meds: . FLUoxetine  20 mg Oral Daily  . insulin aspart  0-15 Units Subcutaneous TID WC  . lamoTRIgine  400 mg Oral QHS  . metoprolol tartrate  25 mg Oral BID  . tamsulosin  0.4 mg Oral BID   Continuous Infusions: . sodium chloride 75 mL/hr at 08/27/16 0910  . piperacillin-tazobactam (ZOSYN)  IV 3.375 g (08/27/16 0945)     LOS: 4 days    Time spent: 30 minutes    Elmarie Shiley, MD [facility] Pager [phone]  If 7PM-7AM, please contact night-coverage [email or web address] Password [email or web address] 08/27/2016, 1:00 PM

## 2016-08-27 NOTE — Evaluation (Signed)
Physical Therapy Evaluation Patient Details Name: BRANSEN FASSNACHT MRN: [id number] DOB: 1956-01-22 Today's Date: 08/27/2016   History of Present Illness  61 y.o. male with medical history significant of DM, CVA in 3/17, HTN, HLD, GERD, and anxiety/depression presenting with fever and abdominal pain.  Sunday, he ate a bit of dinner at noon but didn't feel well Saturday and did not eat.  He has not eaten anything since yesterday noon - but has been drinking water.  He noticed symptoms on Friday, TTP in RUQ.  Difficulty to lie flat "because it would kind of choke me down a little bit."  Fever started last night, 100.5.  +chills/sweats.  +nausea, no vomiting (even after he tried to force himself to vomit).  No diarrhea, no BMs in the last 2 days.  Has not noticed any jaundice.  Mild heartburn, worse with lying down.  Dx: Acute cholecystitis, cholelithiasis.  S/P: Laparoscopic cholecystectomy  Clinical Impression  Pt received in bed, and was marginally agreeable to PT evaluation.  He is normally independent with community ambulation, and independent with ADL's.  During PT evaluation today, he required up to Mod A for log roll to get out of bed.  He ambulated 274ft with RW and Min A due to veering to the right.  Pt is recommended to return home with HHPT upon d/c.     Follow Up Recommendations Home health PT    Equipment Recommendations  Rolling walker with 5" wheels    Recommendations for Other Services       Precautions / Restrictions Precautions Precautions: Fall Precaution Comments: Due to immobility Restrictions Weight Bearing Restrictions: No Other Position/Activity Restrictions: Log roll for getting in/out of the bed.       Mobility  Bed Mobility Overal bed mobility: Needs Assistance Bed Mobility: Supine to Sit;Rolling Rolling: Min assist   Supine to sit: Min assist;Mod assist        Transfers Overall transfer level: Needs assistance Equipment used: Rolling walker (2  wheeled) Transfers: Sit to/from Stand Sit to Stand: Min assist            Ambulation/Gait Ambulation/Gait assistance: Min assist Ambulation Distance (Feet): 200 Feet Assistive device: Rolling walker (2 wheeled) Gait Pattern/deviations: Step-through pattern;Drifts right/left;Narrow base of support   Gait velocity interpretation: <1.8 ft/sec, indicative of risk for recurrent falls General Gait Details: Pt seems to veer to the right during ambulation.  He has a history of L sided weakness with a CVA 1 yr ago.  SpO2 remained 92% on RA during ambulation.    Stairs            Wheelchair Mobility    Modified Rankin (Stroke Patients Only)       Balance Overall balance assessment: Needs assistance Sitting-balance support: Feet supported;Bilateral upper extremity supported Sitting balance-Leahy Scale: Good     Standing balance support: Bilateral upper extremity supported Standing balance-Leahy Scale: Fair                               Pertinent Vitals/Pain Pain Assessment: 0-10 Pain Score: 4  Pain Location: Abdomen - incisional Pain Descriptors / Indicators: Throbbing Pain Intervention(s): Limited activity within patient's tolerance;Monitored during session;Repositioned    Home Living   Living Arrangements: Alone   Type of Home: Mobile home Home Access: Ramped entrance     Home Layout: One level Home Equipment: Tub bench;Walker - 2 wheels;Cane - single point      Prior  Function Level of Independence: Independent   Gait / Transfers Assistance Needed: independent  ADL's / Homemaking Assistance Needed: independent        Hand Dominance   Dominant Hand: Left    Extremity/Trunk Assessment   Upper Extremity Assessment Upper Extremity Assessment: Generalized weakness    Lower Extremity Assessment Lower Extremity Assessment: Generalized weakness       Communication   Communication: No difficulties  Cognition Arousal/Alertness:  Awake/alert Behavior During Therapy: WFL for tasks assessed/performed Overall Cognitive Status: Within Functional Limits for tasks assessed                                        General Comments      Exercises     Assessment/Plan    PT Assessment Patient needs continued PT services  PT Problem List Decreased strength;Decreased activity tolerance;Decreased balance;Decreased mobility;Obesity       PT Treatment Interventions DME instruction;Gait training;Functional mobility training;Therapeutic activities;Therapeutic exercise;Balance training;Patient/family education    PT Goals (Current goals can be found in the Care Plan section)  Acute Rehab PT Goals Patient Stated Goal: To get stronger and go home.  PT Goal Formulation: With patient Time For Goal Achievement: 09/10/16 Potential to Achieve Goals: Good    Frequency Min 3X/week   Barriers to discharge Decreased caregiver support lives alone    Co-evaluation               End of Session Equipment Utilized During Treatment: Gait belt Activity Tolerance: Patient tolerated treatment well Patient left: in chair;with call bell/phone within reach Nurse Communication: Mobility status Lattie Haw, RN aware of pt's mobility status, and mobility sheet left hanging in the room.  ) PT Visit Diagnosis: Muscle weakness (generalized) (M62.81);Other abnormalities of gait and mobility (R26.89);Pain Pain - part of body:  (surgical incision - abdomen)    Time: 9892-1194 PT Time Calculation (min) (ACUTE ONLY): 28 min   Charges:   PT Evaluation $PT Eval Low Complexity: 1 Procedure PT Treatments $Gait Training: 8-22 mins   PT G Codes:   PT G-Codes **NOT FOR INPATIENT CLASS** Functional Assessment Tool Used: AM-PAC 6 Clicks Basic Mobility;Clinical judgement Functional Limitation: Mobility: Walking and moving around Mobility: Walking and Moving Around Current Status (R7408): At least 40 percent but less than 60 percent  impaired, limited or restricted Mobility: Walking and Moving Around Goal Status [phone]): At least 20 percent but less than 40 percent impaired, limited or restricted    Beth Rohn Fritsch, PT, DPT X: [phone]

## 2016-08-27 NOTE — Progress Notes (Signed)
Bladder scan revealed 135ml.  Patient urinates about 118ml at a time about every 2 hours.

## 2016-08-27 NOTE — Care Management Note (Signed)
Case Management Note  Patient Details  Name: Robert Walters MRN: [id number] Date of Birth: 03-Apr-1956  Expected Discharge Date:       08/30/2016           Expected Discharge Plan:  [place]  In-House Referral:  NA  Discharge planning Services  CM Consult  Post Acute Care Choice:  Home Health Choice offered to:  Patient  HH Arranged:  PT [place] Agency:  [place]  Status of Service:  Completed, signed off  If discussed at [place] of Stay Meetings, dates discussed:    Additional Comments: Potential DC home over weekend. HH PT has been recommenced by PT. Pt requests [facility] be [place] agency. Pt aware that [facility] has 48hrs to make first visit. Vaughan Basta, [facility] rep, aware of referral and will obtain pt info from chart. If pt discharges over weekend, RN will notify [facility] of DC. Pt has no DME needs, already has RW at home. No other needs communicated.   Sherald Barge, RN 08/27/2016, 12:53 PM

## 2016-08-28 LAB — CBC
HCT: 29.2 % — ABNORMAL LOW (ref 39.0–52.0)
HEMOGLOBIN: 9.9 g/dL — AB (ref 13.0–17.0)
MCH: 30.7 pg (ref 26.0–34.0)
MCHC: 33.9 g/dL (ref 30.0–36.0)
MCV: 90.7 fL (ref 78.0–100.0)
Platelets: 174 10*3/uL (ref 150–400)
RBC: 3.22 MIL/uL — ABNORMAL LOW (ref 4.22–5.81)
RDW: 13.9 % (ref 11.5–15.5)
WBC: 5.7 10*3/uL (ref 4.0–10.5)

## 2016-08-28 LAB — BASIC METABOLIC PANEL
Anion gap: 9 (ref 5–15)
BUN: 14 mg/dL (ref 6–20)
CO2: 25 mmol/L (ref 22–32)
Calcium: 8.1 mg/dL — ABNORMAL LOW (ref 8.9–10.3)
Chloride: 105 mmol/L (ref 101–111)
Creatinine, Ser: 1.14 mg/dL (ref 0.61–1.24)
GFR calc non Af Amer: >60 mL/min (ref >60)
GLUCOSE: 186 mg/dL — AB (ref 65–99)
POTASSIUM: 3.8 mmol/L (ref 3.5–5.1)
SODIUM: 139 mmol/L (ref 135–145)

## 2016-08-28 LAB — CULTURE, BLOOD (ROUTINE X 2)
CULTURE: NO GROWTH
CULTURE: NO GROWTH
SPECIAL REQUESTS: ADEQUATE
SPECIAL REQUESTS: ADEQUATE

## 2016-08-28 LAB — GLUCOSE, CAPILLARY
Glucose-Capillary: 175 mg/dL — ABNORMAL HIGH (ref 65–99)
Glucose-Capillary: 188 mg/dL — ABNORMAL HIGH (ref 65–99)
Glucose-Capillary: 187 mg/dL — ABNORMAL HIGH (ref 65–99)
Glucose-Capillary: 173 mg/dL — ABNORMAL HIGH (ref 65–99)

## 2016-08-28 MED ORDER — BISACODYL 10 MG RE SUPP
10.0000 mg | Freq: Two times a day (BID) | RECTAL | Status: DC
  Administered 2016-08-28 – 2016-08-29 (×3): 10 mg via RECTAL
  Filled 2016-08-28 (×6): qty 1

## 2016-08-28 NOTE — Progress Notes (Signed)
3 Days Post-Op  Subjective: Patient still without significant bowel movement. He did have some incisional pain after eating a hamburger.  Objective: Vital signs in last 24 hours: Temp:  [97.7 F (36.5 C)-98.5 F (36.9 C)] 98.3 F (36.8 C) (04/28 0731) Pulse Rate:  [75-85] 75 (04/28 0731) Resp:  [18-20] 20 (04/28 0731) BP: (126-162)/(74-79) 162/74 (04/28 0731) SpO2:  [91 %-96 %] 96 % (04/28 0731) Last BM Date: 08/24/16  Intake/Output from previous day: 04/27 0701 - 04/28 0700 In: 720 [P.O.:720] Out: 1780 [Urine:1650; Drains:130] Intake/Output this shift: Total I/O In: -  Out: 600 [Urine:600]  General appearance: alert, cooperative and no distress Resp: clear to auscultation bilaterally Cardio: regular rate and rhythm, S1, S2 normal, no murmur, click, rub or gallop GI: Soft, incision healing well. JP drainage serosanguineous in nature. Small amount of bile present.  Lab Results:   Recent Labs  08/27/16 0705 08/28/16 0708  WBC 5.8 5.7  HGB 9.4* 9.9*  HCT 28.0* 29.2*  PLT 137* 174   BMET  Recent Labs  08/27/16 0705 08/28/16 0708  NA 136 139  K 3.9 3.8  CL 102 105  CO2 25 25  GLUCOSE 180* 186*  BUN 16 14  CREATININE 1.17 1.14  CALCIUM 7.4* 8.1*   PT/INR No results for input(s): LABPROT, INR in the last 72 hours.  Studies/Results: No results found.  Anti-infectives: Anti-infectives    Start     Dose/Rate Route Frequency Ordered Stop   08/23/16 2200  piperacillin-tazobactam (ZOSYN) IVPB 3.375 g     3.375 g 12.5 mL/hr over 240 Minutes Intravenous Every 8 hours 08/23/16 1857     08/23/16 1615  piperacillin-tazobactam (ZOSYN) IVPB 3.375 g     3.375 g 100 mL/hr over 30 Minutes Intravenous  Once 08/23/16 1607 08/23/16 1733      Assessment/Plan: s/p Procedure(s): LAPAROSCOPIC CHOLECYSTECTOMY Impression: Stable on postoperative day 3. Final pathology revealed gangrenous cholecystitis. We will encourage patient to ambulate. JP drain will stay in for 1-2  weeks. Continue IV antibiotics for now. Hopefully will be discharged over the next few days once he is more steady on his feet and his bowel function has fully returned.  LOS: 5 days    [name] 08/28/2016

## 2016-08-28 NOTE — Progress Notes (Signed)
Patient not seen, patient care transfer to Dr Arnoldo Morale. Call us as needed.

## 2016-08-29 LAB — GLUCOSE, CAPILLARY
GLUCOSE-CAPILLARY: 158 mg/dL — AB (ref 65–99)
Glucose-Capillary: 167 mg/dL — ABNORMAL HIGH (ref 65–99)
Glucose-Capillary: 233 mg/dL — ABNORMAL HIGH (ref 65–99)
Glucose-Capillary: 169 mg/dL — ABNORMAL HIGH (ref 65–99)

## 2016-08-29 MED ORDER — MAGNESIUM HYDROXIDE 400 MG/5ML PO SUSP
30.0000 mL | Freq: Three times a day (TID) | ORAL | Status: DC
  Administered 2016-08-29 – 2016-08-30 (×2): 30 mL via ORAL
  Filled 2016-08-29 (×4): qty 30

## 2016-08-29 NOTE — Progress Notes (Signed)
4 Days Post-Op  Subjective: Patient has not had a bowel movement yet. Mild incisional pain.  Objective: Vital signs in last 24 hours: Temp:  [97.6 F (36.4 C)-98.3 F (36.8 C)] 97.6 F (36.4 C) (04/29 6314) Pulse Rate:  [62-68] 68 (04/29 0613) Resp:  [20] 20 (04/29 0613) BP: (128-146)/(80-89) 146/80 (04/29 0613) SpO2:  [97 %-98 %] 98 % (04/29 0613) Last BM Date: 08/24/16  Intake/Output from previous day: 04/28 0701 - 04/29 0700 In: 3657.5 [P.O.:840; I.V.:2617.5; IV Piggyback:200] Out: 1600 [Urine:1450; Drains:150] Intake/Output this shift: No intake/output data recorded.  General appearance: alert, cooperative and no distress Resp: clear to auscultation bilaterally Cardio: regular rate and rhythm, S1, S2 normal, no murmur, click, rub or gallop GI: Soft, incisions healing well. JP drainage with bilious drainage present. Bowel sounds present.  Lab Results:   Recent Labs  08/27/16 0705 08/28/16 0708  WBC 5.8 5.7  HGB 9.4* 9.9*  HCT 28.0* 29.2*  PLT 137* 174   BMET  Recent Labs  08/27/16 0705 08/28/16 0708  NA 136 139  K 3.9 3.8  CL 102 105  CO2 25 25  GLUCOSE 180* 186*  BUN 16 14  CREATININE 1.17 1.14  CALCIUM 7.4* 8.1*   PT/INR No results for input(s): LABPROT, INR in the last 72 hours.  Studies/Results: No results found.  Anti-infectives: Anti-infectives    Start     Dose/Rate Route Frequency Ordered Stop   08/23/16 2200  piperacillin-tazobactam (ZOSYN) IVPB 3.375 g     3.375 g 12.5 mL/hr over 240 Minutes Intravenous Every 8 hours 08/23/16 1857     08/23/16 1615  piperacillin-tazobactam (ZOSYN) IVPB 3.375 g     3.375 g 100 mL/hr over 30 Minutes Intravenous  Once 08/23/16 1607 08/23/16 1733      Assessment/Plan: s/p Procedure(s): LAPAROSCOPIC CHOLECYSTECTOMY Impression: Postoperative day 4. Awaiting full return of bowel function. We'll try to ambulate patient.  LOS: 6 days    [name] 08/29/2016

## 2016-08-29 NOTE — Progress Notes (Signed)
Pharmacy Antibiotic Note  Robert Walters is a 61 y.o. male admitted on 08/23/2016 with Intra-abdominal Infection.  Pharmacy has been consulted for Zosyn  dosing. Post op Day#4 for lap chole. Afebrile, WBC normal, pain improved,  incisions healing well  Plan: Continue Zosyn 3.375gm IV every 8 hours. Consider deescalate to PO therapy  Height: 5\' 9"  (175.3 cm) Weight: 220 lb (99.8 kg) IBW/kg (Calculated) : 70.7  Temp (24hrs), Avg:98 F (36.7 C), Min:97.6 F (36.4 C), Max:98.3 F (36.8 C)   Recent Labs Lab 08/23/16 1329 08/23/16 1356 08/23/16 1958 08/24/16 0439 08/25/16 0821 08/26/16 0457 08/27/16 0705 08/28/16 0708  WBC  --   --   --  9.8 9.5 7.7 5.8 5.7  CREATININE  --   --   --  1.14 1.54* 1.53* 1.17 1.14  LATICACIDVEN 1.25 1.3 0.9  --   --   --   --   --     Estimated Creatinine Clearance: 78.2 mL/min (by C-G formula based on SCr of 1.14 mg/dL).    No Known Allergies  Antimicrobials this admission:  Zosyn 4/23 >>   Dose adjustments this admission:  n/a   Microbiology results:  4/23 BCx: ng final 4/23 UCx: insignificant growth 4.23 MRSA PCR is negative 4/23 HIV: nonreative  Thanks for allowing pharmacy to be a part of this patient's care.  Excell Seltzer, PharmD Clinical Pharmacist 08/29/2016 10:26 AM

## 2016-08-30 LAB — GLUCOSE, CAPILLARY
GLUCOSE-CAPILLARY: 168 mg/dL — AB (ref 65–99)
GLUCOSE-CAPILLARY: 156 mg/dL — AB (ref 65–99)
Glucose-Capillary: 174 mg/dL — ABNORMAL HIGH (ref 65–99)
Glucose-Capillary: 157 mg/dL — ABNORMAL HIGH (ref 65–99)

## 2016-08-30 NOTE — Progress Notes (Signed)
5 Days Post-Op  Subjective: Patient had difficulty voiding and just was able to void on his own 1 hour ago. We have increased his ambulation at times. Patient does have moderate incisional pain.  Objective: Vital signs in last 24 hours: Temp:  [97.9 F (36.6 C)-98.1 F (36.7 C)] 98.1 F (36.7 C) (04/29 2140) Pulse Rate:  [71-72] 71 (04/29 2140) Resp:  [20] 20 (04/29 2140) BP: (136-151)/(68-72) 151/72 (04/29 2140) SpO2:  [98 %-99 %] 99 % (04/29 2140) Last BM Date: 08/29/16  Intake/Output from previous day: 04/29 0701 - 04/30 0700 In: 770 [P.O.:720; IV Piggyback:50] Out: 1700 [Urine:1600] Intake/Output this shift: Total I/O In: 3790 [P.O.:240; I.V.:3330; Other:170; IV Piggyback:50] Out: 600 [Urine:600]  General appearance: alert, cooperative and no distress Resp: clear to auscultation bilaterally Cardio: regular rate and rhythm, S1, S2 normal, no murmur, click, rub or gallop GI: Soft, incisions healing well. JP drainage is bilious. Bowel sounds active.  Lab Results:   Recent Labs  08/28/16 0708  WBC 5.7  HGB 9.9*  HCT 29.2*  PLT 174   BMET  Recent Labs  08/28/16 0708  NA 139  K 3.8  CL 105  CO2 25  GLUCOSE 186*  BUN 14  CREATININE 1.14  CALCIUM 8.1*   PT/INR No results for input(s): LABPROT, INR in the last 72 hours.  Studies/Results: No results found.  Anti-infectives: Anti-infectives    Start     Dose/Rate Route Frequency Ordered Stop   08/23/16 2200  piperacillin-tazobactam (ZOSYN) IVPB 3.375 g     3.375 g 12.5 mL/hr over 240 Minutes Intravenous Every 8 hours 08/23/16 1857     08/23/16 1615  piperacillin-tazobactam (ZOSYN) IVPB 3.375 g     3.375 g 100 mL/hr over 30 Minutes Intravenous  Once 08/23/16 1607 08/23/16 1733      Assessment/Plan: s/p Procedure(s): LAPAROSCOPIC CHOLECYSTECTOMY Impression: Postoperative day 5, status post laparoscopic cholecystectomy. We will be leaving JP drain in until it ceases. Patient has had difficulty voiding,  but I anticipate that this should improve over the next 24 hours. He has been ambulating in the hallway. Anticipate discharge in next 24-48 hours.  LOS: 7 days    [name] 08/30/2016

## 2016-08-31 LAB — GLUCOSE, CAPILLARY: Glucose-Capillary: 142 mg/dL — ABNORMAL HIGH (ref 65–99)

## 2016-08-31 MED ORDER — OXYCODONE-ACETAMINOPHEN 7.5-325 MG PO TABS
1.0000 | ORAL_TABLET | ORAL | 0 refills | Status: DC | PRN
Start: 2016-08-31 — End: 2016-09-15

## 2016-08-31 NOTE — Discharge Instructions (Signed)
Surgical Drain Home Care °Surgical drains are used to remove extra fluid that normally builds up in a surgical wound after surgery. A surgical drain helps to heal a surgical wound. Different kinds of surgical drains include: °· Active drains. These drains use suction to pull drainage away from the surgical wound. Drainage flows through a tube to a container outside of the body. It is important to keep the bulb or the drainage container flat (compressed) at all times, except while you empty it. Flattening the bulb or container creates suction. The two most common types of active drains are bulb drains and Hemovac drains. °· Passive drains. These drains allow fluid to drain naturally, by gravity. Drainage flows through a tube to a bandage (dressing) or a container outside of the body. Passive drains do not need to be emptied. The most common type of passive drain is the Penrose drain. °A drain is placed during surgery. Immediately after surgery, drainage is usually bright red and a little thicker than water. The drainage may gradually turn yellow or pink and become thinner. It is likely that your health care provider will remove the drain when the drainage stops or when the amount decreases to 1-2 Tbsp (15-30 mL) during a 24-hour period. °How to care for your surgical drain °· Keep the skin around the drain dry and covered with a dressing at all times. °· Check your drain area every day for signs of infection. Check for: °¨ More redness, swelling, or pain. °¨ Pus or a bad smell. °¨ Cloudy drainage. °Follow instructions from your health care provider about how to take care of your drain and how to change your dressing. Change your dressing at least one time every day. Change it more often if needed to keep the dressing dry. Make sure you: °1. Gather your supplies, including: °¨ Tape. °¨ Germ-free cleaning solution (sterile saline). °¨ Split gauze drain sponge: 4 x 4 inches (10 x 10 cm). °¨ Gauze square: 4 x 4 inches  (10 x 10 cm). °2. Wash your hands with soap and water before you change your dressing. If soap and water are not available, use hand sanitizer. °3. Remove the old dressing. Avoid using scissors to do that. °4. Use sterile saline to clean your skin around the drain. °5. Place the tube through the slit in a drain sponge. Place the drain sponge so that it covers your wound. °6. Place the gauze square or another drain sponge on top of the drain sponge that is on the wound. Make sure the tube is between those layers. °7. Tape the dressing to your skin. °8. If you have an active bulb or Hemovac drain, tape the drainage tube to your skin 1-2 inches (2.5-5 cm) below the place where the tube enters your body. Taping keeps the tube from pulling on any stitches (sutures) that you have. °9. Wash your hands with soap and water. °10. Write down the color of your drainage and how often you change your dressing. °How to empty your active bulb or Hemovac drain °1. Make sure that you have a measuring cup that you can empty your drainage into. °2. Wash your hands with soap and water. If soap and water are not available, use hand sanitizer. °3. Gently move your fingers down the tube while squeezing very lightly. This is called stripping the tube. This clears any drainage, clots, or tissue from the tube. °¨ Do not pull on the tube. °¨ You may need to strip the tube   several times every day to keep the tube clear. 4. Open the bulb cap or the drain plug. Do not touch the inside of the cap or the bottom of the plug. 5. Empty all of the drainage into the measuring cup. 6. Compress the bulb or the container and replace the cap or the plug. To compress the bulb or the container, squeeze it firmly in the middle while you close the cap or plug the container. 7. Write down the amount of drainage that you have in each 24-hour period. If you have less than 2 Tbsp (30 mL) of drainage during 24 hours, contact your health care provider. 8. Flush  the drainage down the toilet. 9. Wash your hands with soap and water. Contact a health care provider if:  You have more redness, swelling, or pain around your drain area.  The amount of drainage that you have is increasing instead of decreasing.  You have pus or a bad smell coming from your drain area.  You have a fever.  You have drainage that is cloudy.  There is a sudden stop or a sudden decrease in the amount of drainage that you have.  Your tube falls out.  Your active draindoes not stay compressedafter you empty it. This information is not intended to replace advice given to you by your health care provider. Make sure you discuss any questions you have with your health care provider. Document Released: 04/16/2000 Document Revised: 09/25/2015 Document Reviewed: 11/06/2014 Elsevier Interactive Patient Education  2017 [employer] Laparoscopic Cholecystectomy, Care After This sheet gives you information about how to care for yourself after your procedure. Your doctor may also give you more specific instructions. If you have problems or questions, contact your doctor. Follow these instructions at home: Care for cuts from surgery (incisions)    Follow instructions from your doctor about how to take care of your cuts from surgery. Make sure you:  Wash your hands with soap and water before you change your bandage (dressing). If you cannot use soap and water, use hand sanitizer.  Change your bandage as told by your doctor.  Leave stitches (sutures), skin glue, or skin tape (adhesive) strips in place. They may need to stay in place for 2 weeks or longer. If tape strips get loose and curl up, you may trim the loose edges. Do not remove tape strips completely unless your doctor says it is okay.  Do not take baths, swim, or use a hot tub until your doctor says it is okay. Ask your doctor if you can take showers. You may only be allowed to take sponge baths for bathing.  Check your  surgical cut area every day for signs of infection. Check for:  More redness, swelling, or pain.  More fluid or blood.  Warmth.  Pus or a bad smell. Activity   Do not drive or use heavy machinery while taking prescription pain medicine.  Do not lift anything that is heavier than 10 lb (4.5 kg) until your doctor says it is okay.  Do not play contact sports until your doctor says it is okay.  Do not drive for 24 hours if you were given a medicine to help you relax (sedative).  Rest as needed. Do not return to work or school until your doctor says it is okay. General instructions   Take over-the-counter and prescription medicines only as told by your doctor.  To prevent or treat constipation while you are taking prescription pain medicine, your doctor  may recommend that you:  Drink enough fluid to keep your pee (urine) clear or pale yellow.  Take over-the-counter or prescription medicines.  Eat foods that are high in fiber, such as fresh fruits and vegetables, whole grains, and beans.  Limit foods that are high in fat and processed sugars, such as fried and sweet foods. Contact a doctor if:  You develop a rash.  You have more redness, swelling, or pain around your surgical cuts.  You have more fluid or blood coming from your surgical cuts.  Your surgical cuts feel warm to the touch.  You have pus or a bad smell coming from your surgical cuts.  You have a fever.  One or more of your surgical cuts breaks open. Get help right away if:  You have trouble breathing.  You have chest pain.  You have pain that is getting worse in your shoulders.  You faint or feel dizzy when you stand.  You have very bad pain in your belly (abdomen).  You are sick to your stomach (nauseous) for more than one day.  You have throwing up (vomiting) that lasts for more than one day.  You have leg pain. This information is not intended to replace advice given to you by your health care  provider. Make sure you discuss any questions you have with your health care provider. Document Released: 01/27/2008 Document Revised: 11/08/2015 Document Reviewed: 10/06/2015 Elsevier Interactive Patient Education  2017 [employer].

## 2016-08-31 NOTE — Discharge Planning (Signed)
Patient informed they are being discharged today.  Education completed on care of JP drain at home.  Will leave after IV anbx dose is complete.  Patient will call [facility] to get home.

## 2016-08-31 NOTE — Discharge Planning (Signed)
Zosyn complete.  IV removed and [facility] called.  RN asssessment and VS revealed stability for DC to home and HH to see at home.  Patient being sent home with JP drain, per orders and has been educated with teachback on s/sx of infection and how to empty as needed.  Informed of FU appt and that drain would probably be removed at that appt.  Signed pain script given.  Waiting on [facility] to call hospital with estimated time of arrival.  Patient will receive assistance to ATM in lobby, to obtain cash for [facility] services.

## 2016-08-31 NOTE — Care Management Important Message (Signed)
Important Message  Patient Details  Name: Robert Walters MRN: [id number] Date of Birth: [birth year]   Medicare Important Message Given:  Yes    Sherald Barge, RN 08/31/2016, 9:20 AM

## 2016-08-31 NOTE — Discharge Planning (Signed)
[facility] returned call and indicated they were sorry, but d/t some training classes today, they would not have any drivers available to transport patient home today.  Patient states he's unable to pay for a taxi and doesn't have anyone to come get him. SW and AC contacted to obtain taxi voucher and [employer] will be called to transport.  Patient informed and agreed.

## 2016-08-31 NOTE — Discharge Summary (Signed)
Physician Discharge Summary  Patient ID: Robert Walters MRN: [id number] DOB/AGE: 08-Jan-1956 61 y.o.  Admit date: 08/23/2016 Discharge date: 08/31/2016  Admission Diagnoses:Acute cholecystitis, cholelithiasis  Discharge Diagnoses: Gangrene of gallbladder, same Principal Problem:   Sepsis ([place]) Active Problems:   HTN (hypertension)   Diabetes mellitus type 2 with complications ([place])   Bipolar 1 disorder ([place])   Cholecystitis   Thrombocytopenia ([place])   Discharged Condition: good  Hospital Course: Patient is a 61 year old white male who presented to the emergency room with worsening right upper quadrant abdominal pain. CT scan and ultrasound of the abdomen revealed acute cholecystitis with cholelithiasis. He was taken to the operating room on 08/25/2016 and underwent laparoscopic cholecystectomy. He was found to have gangrene of the gallbladder. A drain was left. The patient's postoperative course has been remarkable for difficulty with voiding and some ambulation difficulties. This was addressed by physical therapy. Home health has been consulted. He is being discharged home on 08/31/2061 in good and improving condition. His drain will remain in place.  Treatments: surgery: Laparoscopic cholecystectomy on 08/25/2016  Discharge Exam: Blood pressure (!) 144/72, pulse 70, temperature 98.1 F (36.7 C), temperature source Oral, resp. rate 20, height 5\' 9"  (1.753 m), weight 220 lb (99.8 kg), SpO2 100 %. General appearance: alert, cooperative and no distress Resp: clear to auscultation bilaterally Cardio: regular rate and rhythm, S1, S2 normal, no murmur, click, rub or gallop GI: Soft, incisions healing well. Bilious drainage present in bulb drain. Active bowel sounds appreciated.  Disposition: 01-Home or Self Care  Discharge Instructions    Diet Carb Modified    Complete by:  As directed    Increase activity slowly    Complete by:  As directed      Allergies as of 08/31/2016   No Known  Allergies     Medication List    TAKE these medications   aspirin 325 MG tablet Take 1 tablet (325 mg total) by mouth daily.   atorvastatin 40 MG tablet Commonly known as:  LIPITOR Take 1 tablet (40 mg total) by mouth daily at 6 PM.   FLUoxetine 20 MG capsule Commonly known as:  PROZAC Take 20 mg by mouth daily.   glipiZIDE 10 MG tablet Commonly known as:  GLUCOTROL Take 10 mg by mouth daily.   lamoTRIgine 200 MG tablet Commonly known as:  LAMICTAL Take 400 mg by mouth at bedtime.   LORazepam 1 MG tablet Commonly known as:  ATIVAN Take 1 mg by mouth every 8 (eight) hours.   losartan 100 MG tablet Commonly known as:  COZAAR Take 1 tablet (100 mg total) by mouth daily.   metFORMIN 1000 MG tablet Commonly known as:  GLUCOPHAGE Take 1,000 mg by mouth 2 (two) times daily with a meal.   metoCLOPramide 5 MG tablet Commonly known as:  REGLAN Take 5 mg by mouth every morning.   metoprolol tartrate 25 MG tablet Commonly known as:  LOPRESSOR Take 1 tablet (25 mg total) by mouth 2 (two) times daily.   oxyCODONE-acetaminophen 7.5-325 MG tablet Commonly known as:  PERCOCET Take 1-2 tablets by mouth every 4 (four) hours as needed.   tamsulosin 0.4 MG Caps capsule Commonly known as:  FLOMAX Take 0.4 mg by mouth 2 (two) times daily.   Vitamin D 2000 units Caps Take 1 capsule by mouth daily.   zolpidem 10 MG tablet Commonly known as:  AMBIEN Take 1 tablet (10 mg total) by mouth at bedtime as needed for sleep. What changed:  when to take this      Follow-up [facility] Follow up.   Contact information: [address] [place] [address] [phone]        [name], MD. Schedule an appointment as soon as possible for a visit on 09/07/2016.   Specialty:  General Surgery Contact information: [address] [place] [address] [phone]           Signed: [name] 08/31/2016, 8:59 AM

## 2016-08-31 NOTE — Care Management Note (Signed)
Case Management Note  Patient Details  Name: KAYODE PETION MRN: [id number] Date of Birth: 29-Aug-1955  Expected Discharge Date:  08/31/16               Expected Discharge Plan:  [place]  In-House Referral:  NA  Discharge planning Services  CM Consult  Post Acute Care Choice:  Home Health Choice offered to:  Patient  HH Arranged:  PT [place] Agency:  [facility]  Status of Service:  Completed, signed off  If discussed at [place] of Stay Meetings, dates discussed:  08/31/2016  Additional Comments: Pt discharging home today. [facility] aware of DC, pt aware HH has 48hrs to make first visit. No other needs communicated.   Sherald Barge, RN 08/31/2016, 9:19 AM

## 2016-09-01 ENCOUNTER — Emergency Department (HOSPITAL_COMMUNITY)
Admission: EM | Admit: 2016-09-01 | Discharge: 2016-09-01 | Disposition: A | Discharge status: Home / Self Care | Payer: Medicare Other | Attending: Emergency Medicine | Admitting: Emergency Medicine

## 2016-09-01 ENCOUNTER — Emergency Department (HOSPITAL_COMMUNITY): Payer: Medicare Other

## 2016-09-01 ENCOUNTER — Encounter (HOSPITAL_COMMUNITY): Payer: Self-pay | Admitting: Emergency Medicine

## 2016-09-01 DIAGNOSIS — T50905A Adverse effect of unspecified drugs, medicaments and biological substances, initial encounter: Secondary | ICD-10-CM

## 2016-09-01 DIAGNOSIS — Z79899 Other long term (current) drug therapy: Secondary | ICD-10-CM | POA: Diagnosis not present

## 2016-09-01 DIAGNOSIS — T426X5A Adverse effect of other antiepileptic and sedative-hypnotic drugs, initial encounter: Secondary | ICD-10-CM | POA: Insufficient documentation

## 2016-09-01 DIAGNOSIS — E119 Type 2 diabetes mellitus without complications: Secondary | ICD-10-CM | POA: Diagnosis not present

## 2016-09-01 DIAGNOSIS — Z7982 Long term (current) use of aspirin: Secondary | ICD-10-CM | POA: Insufficient documentation

## 2016-09-01 DIAGNOSIS — I10 Essential (primary) hypertension: Secondary | ICD-10-CM | POA: Insufficient documentation

## 2016-09-01 DIAGNOSIS — R4182 Altered mental status, unspecified: Secondary | ICD-10-CM | POA: Insufficient documentation

## 2016-09-01 DIAGNOSIS — Z7984 Long term (current) use of oral hypoglycemic drugs: Secondary | ICD-10-CM | POA: Diagnosis not present

## 2016-09-01 DIAGNOSIS — R441 Visual hallucinations: Secondary | ICD-10-CM | POA: Diagnosis not present

## 2016-09-01 DIAGNOSIS — F99 Mental disorder, not otherwise specified: Secondary | ICD-10-CM | POA: Diagnosis not present

## 2016-09-01 DIAGNOSIS — I482 Chronic atrial fibrillation: Secondary | ICD-10-CM | POA: Diagnosis not present

## 2016-09-01 DIAGNOSIS — T50995A Adverse effect of other drugs, medicaments and biological substances, initial encounter: Secondary | ICD-10-CM | POA: Diagnosis not present

## 2016-09-01 DIAGNOSIS — R443 Hallucinations, unspecified: Secondary | ICD-10-CM | POA: Diagnosis not present

## 2016-09-01 LAB — CBC WITH DIFFERENTIAL/PLATELET
Basophils Absolute: 0 10*3/uL (ref 0.0–0.1)
Basophils Relative: 0 %
Eosinophils Absolute: 0.1 10*3/uL (ref 0.0–0.7)
Eosinophils Relative: 1 %
HEMATOCRIT: 36 % — AB (ref 39.0–52.0)
Hemoglobin: 12.1 g/dL — ABNORMAL LOW (ref 13.0–17.0)
LYMPHS ABS: 1.2 10*3/uL (ref 0.7–4.0)
LYMPHS PCT: 11 %
MCH: 30.5 pg (ref 26.0–34.0)
MCHC: 33.6 g/dL (ref 30.0–36.0)
MCV: 90.7 fL (ref 78.0–100.0)
MONO ABS: 0.7 10*3/uL (ref 0.1–1.0)
MONOS PCT: 6 %
NEUTROS ABS: 9.5 10*3/uL — AB (ref 1.7–7.7)
Neutrophils Relative %: 82 %
Platelets: 315 10*3/uL (ref 150–400)
RBC: 3.97 MIL/uL — ABNORMAL LOW (ref 4.22–5.81)
RDW: 14.4 % (ref 11.5–15.5)
WBC: 11.4 10*3/uL — ABNORMAL HIGH (ref 4.0–10.5)

## 2016-09-01 LAB — COMPREHENSIVE METABOLIC PANEL
ALBUMIN: 3.4 g/dL — AB (ref 3.5–5.0)
ALK PHOS: 162 U/L — AB (ref 38–126)
ALT: 44 U/L (ref 17–63)
ANION GAP: 13 (ref 5–15)
AST: 35 U/L (ref 15–41)
BUN: 17 mg/dL (ref 6–20)
CO2: 22 mmol/L (ref 22–32)
Calcium: 9.3 mg/dL (ref 8.9–10.3)
Chloride: 103 mmol/L (ref 101–111)
Creatinine, Ser: 1.46 mg/dL — ABNORMAL HIGH (ref 0.61–1.24)
GFR calc Af Amer: 58 mL/min — ABNORMAL LOW (ref >60)
GFR calc non Af Amer: 50 mL/min — ABNORMAL LOW (ref >60)
GLUCOSE: 150 mg/dL — AB (ref 65–99)
POTASSIUM: 4.1 mmol/L (ref 3.5–5.1)
SODIUM: 138 mmol/L (ref 135–145)
Total Bilirubin: 1 mg/dL (ref 0.3–1.2)
Total Protein: 7.4 g/dL (ref 6.5–8.1)

## 2016-09-01 LAB — ETHANOL: Alcohol, Ethyl (B): <5 mg/dL (ref <5)

## 2016-09-01 LAB — URINALYSIS, COMPLETE (UACMP) WITH MICROSCOPIC
Bilirubin Urine: NEGATIVE
Glucose, UA: NEGATIVE mg/dL
Hgb urine dipstick: NEGATIVE
KETONES UR: 20 mg/dL — AB
LEUKOCYTES UA: NEGATIVE
Nitrite: NEGATIVE
PROTEIN: 30 mg/dL — AB
Specific Gravity, Urine: 1.02 (ref 1.005–1.030)
pH: 5 (ref 5.0–8.0)

## 2016-09-01 LAB — RAPID URINE DRUG SCREEN, HOSP PERFORMED
Amphetamines: NOT DETECTED
BARBITURATES: NOT DETECTED
BENZODIAZEPINES: POSITIVE — AB
COCAINE: NOT DETECTED
OPIATES: NOT DETECTED
Tetrahydrocannabinol: NOT DETECTED

## 2016-09-01 LAB — GLUCOSE, CAPILLARY: Glucose-Capillary: 156 mg/dL — ABNORMAL HIGH (ref 65–99)

## 2016-09-01 LAB — AMMONIA: AMMONIA: 27 umol/L (ref 9–35)

## 2016-09-01 LAB — PROTIME-INR
INR: 1.46
Prothrombin Time: 17.9 seconds — ABNORMAL HIGH (ref 11.4–15.2)

## 2016-09-01 LAB — APTT: aPTT: 28 seconds (ref 24–36)

## 2016-09-01 MED ORDER — SODIUM CHLORIDE 0.9 % IV BOLUS (SEPSIS)
1000.0000 mL | Freq: Once | INTRAVENOUS | Status: AC
  Administered 2016-09-01: 1000 mL via INTRAVENOUS

## 2016-09-01 MED ORDER — SODIUM CHLORIDE 0.9 % IV SOLN
INTRAVENOUS | Status: DC
  Administered 2016-09-01: 17:00:00 via INTRAVENOUS

## 2016-09-01 NOTE — ED Triage Notes (Signed)
Per EMS patient from home due to confusion. Patient admits to taking two Ambien and two Lorazepam. Patient states he didn't realize he had taken so many. Pt states he has been seeing hallucinations since taking medication.

## 2016-09-01 NOTE — ED Notes (Addendum)
Patient's house key and car key were given to Security officer and locked in safe.

## 2016-09-01 NOTE — Discharge Instructions (Signed)
Be careful when taking the ambien and ativan.  Continue your current medications otherwise.  Follow up with your doctor to be rechecked

## 2016-09-01 NOTE — ED Provider Notes (Signed)
[facility] DEPT Provider Note   CSN: [id number] Arrival date & time: 09/01/16  1550     History   Chief Complaint Chief Complaint  Patient presents with  . Altered Mental Status    HPI KHAM ZUCKERMAN is a 61 y.o. male.  HPI Patient presents to the emergency room for evaluation of hallucinations and weakness. The patient was recently admitted to the hospital the end of April for acute cholecystitis and sepsis. Patient was just released from the hospital yesterday. Patient states that home today he started seeing people walking around his house. He went to go confront them but they were not actually there. Patient then fell because of generalized weakness. He was able to get himself up back to the bed. Patient states someone came to the house to check on him. They then called 911. Patient denies any trouble with headaches. He denies any fevers. He denies any vomiting or diarrhea. He denies any increasing abdominal pain. Past Medical History:  Diagnosis Date  . Acid reflux disease   . Bipolar 1 disorder, mixed ([place])   . BPH (benign prostatic hypertrophy)   . CVA (cerebral vascular accident) ([place]) 07/2015  . Gastritis   . H/O hiatal hernia   . Hyperlipidemia   . Hypertension   . Normal nuclear stress test Dec 2011  . Obesity   . TIA (transient ischemic attack) 05/15/2012   "they think I've had one this am @ 0130" (05/15/2012)  . Type II diabetes mellitus ([place])   . Vertigo     Patient Active Problem List   Diagnosis Date Noted  . Sepsis ([place]) 08/23/2016  . Thrombocytopenia ([place]) 08/23/2016  . Cholecystitis   . Stroke ([place]) 07/25/2015  . 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 Dizziness 08/09/2014  . TIA (transient ischemic attack) 05/15/2012  . Blurred vision 05/15/2012  . Ataxia 05/15/2012  . Diabetes mellitus type 2 with complications ([place]) 01/74/9449  . BPH (benign prostatic hyperplasia) 05/15/2012  . GERD (gastroesophageal reflux disease) 05/15/2012  . Obesity  05/15/2012  . Bipolar 1 disorder ([place]) 05/15/2012  . HTN (hypertension) 10/30/2010  . Cardiovascular risk factor 10/30/2010  . Pure hypercholesterolemia 12/05/2008    Past Surgical History:  Procedure Laterality Date  . CARDIOVASCULAR STRESS TEST  2009   EF 63%  . CHOLECYSTECTOMY N/A 08/25/2016   Procedure: LAPAROSCOPIC CHOLECYSTECTOMY;  Surgeon: [name], MD;  Location: [facility];  Service: General;  Laterality: N/A;  . COLONOSCOPY    . ESOPHAGOGASTRODUODENOSCOPY    . NO PAST SURGERIES         Home Medications    Prior to Admission medications   Medication Sig Start Date End Date Taking? Authorizing Provider  aspirin 325 MG tablet Take 1 tablet (325 mg total) by mouth daily. 07/27/15   Kathie Dike, MD  atorvastatin (LIPITOR) 40 MG tablet Take 1 tablet (40 mg total) by mouth daily at 6 PM. 07/27/15   Kathie Dike, MD  Cholecalciferol (VITAMIN D) 2000 UNITS CAPS Take 1 capsule by mouth daily.    Historical Provider, MD  FLUoxetine (PROZAC) 20 MG capsule Take 20 mg by mouth daily.    Historical Provider, MD  glipiZIDE (GLUCOTROL) 10 MG tablet Take 10 mg by mouth daily.     Historical Provider, MD  lamoTRIgine (LAMICTAL) 200 MG tablet Take 400 mg by mouth at bedtime.     Historical Provider, MD  LORazepam (ATIVAN) 1 MG tablet Take 1 mg by mouth every 8 (eight) hours.  Historical Provider, MD  losartan (COZAAR) 100 MG tablet Take 1 tablet (100 mg total) by mouth daily. 03/22/16   Thayer Headings, MD  metFORMIN (GLUCOPHAGE) 1000 MG tablet Take 1,000 mg by mouth 2 (two) times daily with a meal.      Historical Provider, MD  metoCLOPramide (REGLAN) 5 MG tablet Take 5 mg by mouth every morning.     Historical Provider, MD  metoprolol tartrate (LOPRESSOR) 25 MG tablet Take 1 tablet (25 mg total) by mouth 2 (two) times daily. 01/22/16 08/23/16  Thayer Headings, MD  oxyCODONE-acetaminophen (PERCOCET) 7.5-325 MG tablet Take 1-2 tablets by mouth every 4 (four) hours as needed. 08/31/16   [name], MD  Tamsulosin HCl (FLOMAX) 0.4 MG CAPS Take 0.4 mg by mouth 2 (two) times daily.    Historical Provider, MD  zolpidem (AMBIEN) 10 MG tablet Take 1 tablet (10 mg total) by mouth at bedtime as needed for sleep. Patient taking differently: Take 10 mg by mouth at bedtime.  08/15/12   Leonard Schwartz, MD    Family History Family History  Problem Relation Age of Onset  . Hyperlipidemia Father     in a nursing home  . Colon cancer Father   . Stroke Mother 44    Social History Social History  Substance Use Topics  . Smoking status: Never Smoker  . Smokeless tobacco: Never Used  . Alcohol use No     Allergies   Patient has no known allergies.   Review of Systems Review of Systems  Constitutional: Negative for fever.  Neurological: Negative for light-headedness.  Psychiatric/Behavioral: Positive for hallucinations.  All other systems reviewed and are negative.    Physical Exam Updated Vital Signs BP (!) 164/93 (BP Location: Right Arm)   Pulse 100   Temp 98.4 F (36.9 C)   Resp 16   Ht 5\' 9"  (1.753 m)   Wt 99.8 kg   SpO2 100%   BMI 32.49 kg/m   Physical Exam  Constitutional: No distress.  HENT:  Head: Normocephalic and atraumatic.  Right Ear: External ear normal.  Left Ear: External ear normal.  Eyes: Conjunctivae are normal. Right eye exhibits no discharge. Left eye exhibits no discharge. No scleral icterus.  Neck: Neck supple. No tracheal deviation present.  Cardiovascular: Regular rhythm and intact distal pulses.  Tachycardia present.   Pulmonary/Chest: Effort normal and breath sounds normal. No stridor. No respiratory distress. He has no wheezes. He has no rales.  Abdominal: Soft. Bowel sounds are normal. He exhibits no distension. There is no tenderness. There is no rebound and no guarding.  Healing surgical wounds on his abdomen, no erythema or drainage  Musculoskeletal: He exhibits no edema or tenderness.  Neurological: He is alert. He has normal  strength. He is not disoriented. No cranial nerve deficit (no facial droop, extraocular movements intact, no slurred speech) or sensory deficit. He exhibits normal muscle tone. He displays no seizure activity. Coordination normal.  Skin: Skin is warm and dry. No rash noted.  Psychiatric: He has a normal mood and affect.  Nursing note and vitals reviewed.    ED Treatments / Results  Labs (all labs ordered are listed, but only abnormal results are displayed) Labs Reviewed  COMPREHENSIVE METABOLIC PANEL - Abnormal; Notable for the following:       Result Value   Glucose, Bld 150 (*)    Creatinine, Ser 1.46 (*)    Albumin 3.4 (*)    Alkaline Phosphatase 162 (*)  GFR calc non Af Amer 50 (*)    GFR calc Af Amer 58 (*)    All other components within normal limits  CBC WITH DIFFERENTIAL/PLATELET - Abnormal; Notable for the following:    WBC 11.4 (*)    RBC 3.97 (*)    Hemoglobin 12.1 (*)    HCT 36.0 (*)    Neutro Abs 9.5 (*)    All other components within normal limits  PROTIME-INR - Abnormal; Notable for the following:    Prothrombin Time 17.9 (*)    All other components within normal limits  URINALYSIS, COMPLETE (UACMP) WITH MICROSCOPIC - Abnormal; Notable for the following:    Color, Urine AMBER (*)    APPearance CLOUDY (*)    Ketones, ur 20 (*)    Protein, ur 30 (*)    Bacteria, UA RARE (*)    Squamous Epithelial / LPF 0-5 (*)    All other components within normal limits  RAPID URINE DRUG SCREEN, HOSP PERFORMED - Abnormal; Notable for the following:    Benzodiazepines POSITIVE (*)    All other components within normal limits  APTT  AMMONIA  ETHANOL    EKG  EKG Interpretation  Date/Time:  Wednesday Sep 01 2016 15:56:05 EDT Ventricular Rate:  130 PR Interval:    QRS Duration: 92 QT Interval:  336 QTC Calculation: 495 R Axis:   170 Text Interpretation:  Sinus tachycardia Ventricular premature complex Low voltage, precordial leads Probable right ventricular  hypertrophy Borderline prolonged QT interval Since last tracing rate faster Confirmed by Johaan Ryser  MD-J, Brennan Litzinger ([id number]) on 09/01/2016 7:05:27 PM       Radiology Dg Chest 1 View  Result Date: 09/01/2016 CLINICAL DATA:  Altered mental status EXAM: CHEST 1 VIEW COMPARISON:  08/23/2016 FINDINGS: The heart size and mediastinal contours are within normal limits. Both lungs are clear. The visualized skeletal structures are unremarkable. IMPRESSION: No active disease. Electronically Signed   By: Inez Catalina M.D.   On: 09/01/2016 17:18   Ct Head Wo Contrast  Result Date: 09/01/2016 CLINICAL DATA:  patient from home due to confusion. Patient admits to taking two Ambien and two Lorazepam. Patient states he didn't realize he had taken so many. Pt states he has been seeing hallucinations since taking medication.; no known injury EXAM: CT HEAD WITHOUT CONTRAST TECHNIQUE: Contiguous axial images were obtained from the base of the skull through the vertex without intravenous contrast. COMPARISON:  08/01/2015 FINDINGS: Brain: Multiple remote lacunar infarcts are identified within basal ganglia bilaterally. No associated hemorrhage. Periventricular white matter changes are consistent with small vessel disease. Vascular: There is atherosclerotic calcification of the carotid siphons. Skull: Normal. Negative for fracture or focal lesion. Sinuses/Orbits: No acute finding. Other: None IMPRESSION: 1. Multiple remote lacunar infarcts of the basal ganglia. 2.  No evidence for acute  abnormality. 3. Small vessel disease. Electronically Signed   By: Nolon Nations M.D.   On: 09/01/2016 17:38    Procedures Procedures (including critical care time)  Medications Ordered in ED Medications  0.9 %  sodium chloride infusion ( Intravenous Stopped 09/01/16 1838)  sodium chloride 0.9 % bolus 1,000 mL (1,000 mLs Intravenous New Bag/Given 09/01/16 1924)     Initial Impression / Assessment and Plan / ED Course  I have reviewed the triage  vital signs and the nursing notes.  Pertinent labs & imaging results that were available during my care of the patient were reviewed by me and considered in my medical decision making (see chart for details).  the patient's laboratory tests and x-rays are reassuring. No evidence of acute infection, stroke or electrolyte abnormality. I suspect the patient's symptoms were related to his benzodiazepines and Ambien. Patient was not having any issues with hallucinations or confusion here in the emergency room. He was monitored for several hours.  he appears stable for discharge.  Final Clinical Impressions(s) / ED Diagnoses   Final diagnoses:  Adverse effect of drug, initial encounter    New Prescriptions New Prescriptions   No medications on file     Dorie Rank, MD 09/01/16 2137

## 2016-09-03 DIAGNOSIS — E785 Hyperlipidemia, unspecified: Secondary | ICD-10-CM | POA: Diagnosis not present

## 2016-09-03 DIAGNOSIS — K219 Gastro-esophageal reflux disease without esophagitis: Secondary | ICD-10-CM | POA: Diagnosis not present

## 2016-09-03 DIAGNOSIS — E1159 Type 2 diabetes mellitus with other circulatory complications: Secondary | ICD-10-CM | POA: Diagnosis not present

## 2016-09-03 DIAGNOSIS — F419 Anxiety disorder, unspecified: Secondary | ICD-10-CM | POA: Diagnosis not present

## 2016-09-03 DIAGNOSIS — I10 Essential (primary) hypertension: Secondary | ICD-10-CM | POA: Diagnosis not present

## 2016-09-03 DIAGNOSIS — F329 Major depressive disorder, single episode, unspecified: Secondary | ICD-10-CM | POA: Diagnosis not present

## 2016-09-03 DIAGNOSIS — N400 Enlarged prostate without lower urinary tract symptoms: Secondary | ICD-10-CM | POA: Diagnosis not present

## 2016-09-03 DIAGNOSIS — Z48815 Encounter for surgical aftercare following surgery on the digestive system: Secondary | ICD-10-CM | POA: Diagnosis not present

## 2016-09-03 DIAGNOSIS — Z7982 Long term (current) use of aspirin: Secondary | ICD-10-CM | POA: Diagnosis not present

## 2016-09-03 DIAGNOSIS — Z7984 Long term (current) use of oral hypoglycemic drugs: Secondary | ICD-10-CM | POA: Diagnosis not present

## 2016-09-07 ENCOUNTER — Ambulatory Visit (INDEPENDENT_AMBULATORY_CARE_PROVIDER_SITE_OTHER): Payer: Self-pay | Admitting: General Surgery

## 2016-09-07 ENCOUNTER — Encounter: Payer: Self-pay | Admitting: General Surgery

## 2016-09-07 VITALS — BP 123/80 | HR 92 | Temp 96.4°F | Resp 18 | Ht 69.0 in | Wt 201.0 lb

## 2016-09-07 DIAGNOSIS — Z09 Encounter for follow-up examination after completed treatment for conditions other than malignant neoplasm: Secondary | ICD-10-CM

## 2016-09-07 NOTE — Progress Notes (Signed)
Subjective:     Robert Walters  Status post laparoscopic cholecystectomy for gangrenous cholecystitis. He did have a JP drain left in place. Since discharge, he has been doing well. He states the bulge does get full every day and he does empty it in the morning. His appetite is good. He is starting to have good bowel movements. He denies any fever or chills. Objective:    BP 123/80   Pulse 92   Temp (!) 96.4 F (35.8 C)   Resp 18   Ht 5\' 9"  (1.753 m)   Wt 201 lb (91.2 kg)   BMI 29.68 kg/m   General:  alert, cooperative and no distress  Abdomen is soft. Incisions healing well. Staples removed, Steri-Strips applied. Still with yellowish drainage in the JP drain.     Assessment:    Doing well postoperatively. JP drain still putting out yellow material. It is not as bilious as it was last week.    Plan:  Robert Walters JP drain in place. Will see patient again in 1 week for follow-up.

## 2016-09-14 ENCOUNTER — Ambulatory Visit (INDEPENDENT_AMBULATORY_CARE_PROVIDER_SITE_OTHER): Payer: Self-pay | Admitting: General Surgery

## 2016-09-14 ENCOUNTER — Encounter: Payer: Self-pay | Admitting: General Surgery

## 2016-09-14 VITALS — BP 111/71 | HR 98 | Temp 97.5°F | Resp 18 | Ht 69.0 in | Wt 203.0 lb

## 2016-09-14 DIAGNOSIS — Z09 Encounter for follow-up examination after completed treatment for conditions other than malignant neoplasm: Secondary | ICD-10-CM

## 2016-09-14 NOTE — Patient Instructions (Signed)
Endoscopic Retrograde Cholangiopancreatogram °Endoscopic retrograde cholangiopancreatogram (ERCP) is a procedure that may be used to diagnose or treat problems with the pancreas, bile ducts, liver, and gallbladder. For this procedure, a thin, lighted tube (endoscope) is passed through the mouth, the throat, and down into the areas being checked. The endoscope has a camera that allows the areas to be viewed. Dye is injected and then X-rays are taken to further study the areas. °During ERCP, other procedures may also be done to help diagnose or treat problems that are found. For example, stones can be removed, or a tissue sample can be taken out for testing (biopsy). °Tell a health care provider about: °· Any allergies you have. °· All medicines you are taking, including vitamins, herbs, eye drops, creams, and over-the-counter medicines. °· Any problems you or family members have had with anesthetic medicines. °· Any blood disorders you have. °· Any surgeries you have had. °· Any medical conditions you have. °· Whether you are pregnant or may be pregnant. °What are the risks? °Generally, this is a safe procedure. However, problems may occur, including: °· Pancreatitis. °· Infection. °· Bleeding. °· Allergic reactions to medicines or dyes. °· Accidental punctures in the bowel wall, pancreas, or gallbladder. °· Damage to other structures or organs. ° °What happens before the procedure? °Staying hydrated °Follow instructions from your health care provider about hydration, which may include: °· Up to 2 hours before the procedure - you may continue to drink clear liquids, such as water, clear fruit juice, black coffee, and plain tea. ° °Eating and drinking restrictions °Follow instructions from your health care provider about eating and drinking, which may include: °· 8 hours before the procedure - stop eating heavy meals or foods such as meat, fried foods, or fatty foods. °· 6 hours before the procedure - stop eating  light meals or foods, such as toast or cereal. °· 6 hours before the procedure - stop drinking milk or drinks that contain milk. °· 2 hours before the procedure - stop drinking clear liquids. ° °General instructions °· Ask your health care provider about: °? Changing or stopping your regular medicines. This is especially important if you are taking diabetes medicines or blood thinners. °? Taking medicines such as aspirin and ibuprofen. These medicines can thin your blood. Do not take these medicines before your procedure if your health care provider instructs you not to. °· Plan to have someone take you home from the hospital or clinic. °· If you will be going home right after the procedure, plan to have someone with you for 24 hours. °What happens during the procedure? °· To lower your risk of infection, your health care team will wash or sanitize their hands. °· An IV tube will be inserted into one of your veins. °· You will be given one or more of the following: °? A medicine to help you relax (sedative). °? A medicine to numb the throat area (local anesthetic) and prevent gagging. Your throat may be sprayed with this medicine, or you may gargle the medicine. °? A medicine to make you fall asleep (general anesthetic). °? A medicine to lower your risk of infection (antibiotic), inflammation (anti-inflammatory), or both. °· You will lie on your left side. °· The endoscope will be inserted through your mouth, down the back of the throat, and into the first part of the small intestine (duodenum). °· Then a small, plastic tube (cannula) will be passed through the endoscope and directed into the bile duct   or pancreatic duct. °· Dye will be injected through the cannula to make structures easier to see on an X-ray. °· X-rays will be taken to study the biliary and pancreatic passageways. You may be positioned on your abdomen or your back during the X-rays. °· A small sample of tissue (biopsy) may be removed for  examination, or other procedures may be done to fix problems that are found. °The procedure may vary among health care providers and hospitals. °What happens after the procedure? °· Your blood pressure, heart rate, breathing rate, and blood oxygen level will be monitored until the medicines you were given have worn off. °· Your throat may feel slightly sore. °· You will not be allowed to eat or drink until numbness subsides. °· Do not drive for 24 hours if you were given a sedative. °Summary °· Endoscopic retrograde cholangiopancreatogram is a procedure that may be used to diagnose or treat problems with the pancreas, bile ducts, liver, and gallbladder. °· During ERCP, other procedures may also be done to help diagnose or treat problems that are found. For example, stones can be removed, or a tissue sample can be taken out for testing (biopsy). °· Generally, this is a safe procedure. However, problems may occur, including infection, bleeding, pancreatitis, accidental damage to other structures or organs, and allergic reactions to medicines or dyes. °· The procedure may vary among health care providers and hospitals. °This information is not intended to replace advice given to you by your health care provider. Make sure you discuss any questions you have with your health care provider. °Document Released: 01/12/2001 Document Revised: 03/15/2016 Document Reviewed: 03/15/2016 °Elsevier Interactive Patient Education © 2017 [employer] ° °

## 2016-09-14 NOTE — Progress Notes (Signed)
Subjective:     Kathyrn Lass  Status post laparoscopic cholecystectomy. Objective:    BP 111/71   Pulse 98   Temp 97.5 F (36.4 C)   Resp 18   Ht 5\' 9"  (1.753 m)   Wt 203 lb (92.1 kg)   BMI 29.98 kg/m   General:  alert, cooperative and no distress  Abdomen is soft. Incisions healing well. JP drainage still greenish yellow.     Assessment:    Still with JP drainage that is higher than expected, probable bile leak    Plan:   Will refer to Dr. Laural Golden for ERCP and stent placement. We'll see patient back after this procedure is performed.

## 2016-09-15 ENCOUNTER — Ambulatory Visit (INDEPENDENT_AMBULATORY_CARE_PROVIDER_SITE_OTHER): Payer: Medicare Other | Admitting: Internal Medicine

## 2016-09-15 ENCOUNTER — Encounter (INDEPENDENT_AMBULATORY_CARE_PROVIDER_SITE_OTHER): Payer: Self-pay | Admitting: Internal Medicine

## 2016-09-15 ENCOUNTER — Other Ambulatory Visit (INDEPENDENT_AMBULATORY_CARE_PROVIDER_SITE_OTHER): Payer: Self-pay | Admitting: Internal Medicine

## 2016-09-15 ENCOUNTER — Encounter (INDEPENDENT_AMBULATORY_CARE_PROVIDER_SITE_OTHER): Payer: Self-pay | Admitting: *Deleted

## 2016-09-15 VITALS — BP 100/70 | HR 72 | Temp 97.7°F | Ht 69.0 in | Wt 203.2 lb

## 2016-09-15 DIAGNOSIS — K839 Disease of biliary tract, unspecified: Secondary | ICD-10-CM | POA: Insufficient documentation

## 2016-09-15 NOTE — Progress Notes (Signed)
Subjective:    Patient ID: Robert Walters, male    DOB: [birth year], 61 y.o.   MRN: [id number]  HPI Referred by Dr. Arnoldo Morale for ERCP. [place] [facility] of this year with acute cholecystitis, cholelithiasis. 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 Underwent a laproscopic cholecystectomy 08/25/2016 by Dr Arnoldo Morale. J.P drain in place. Per Dr. Arnoldo Morale, patient has had a lot of drainage from the drain, more than expected.  Has not emptied JP Drain since 1130pm last night. 75 cc in drain. He says he has soreness. He says he does not feel good. He has some nausea.  Has had some loose small  stools since taking MOM.   Review of Systems Past Medical History:  Diagnosis Date  . Acid reflux disease   . Bipolar 1 disorder, mixed ([place])   . BPH (benign prostatic hypertrophy)   . CVA (cerebral vascular accident) ([place]) 07/2015  . Gastritis   . H/O hiatal hernia   . Hyperlipidemia   . Hypertension   . Normal nuclear stress test Dec 2011  . Obesity   . TIA (transient ischemic attack) 05/15/2012   "they think I've had one this am @ 0130" (05/15/2012)  . Type II diabetes mellitus ([place])   . Vertigo     Past Surgical History:  Procedure Laterality Date  . CARDIOVASCULAR STRESS TEST  2009   EF 63%  . CHOLECYSTECTOMY N/A 08/25/2016   Procedure: LAPAROSCOPIC CHOLECYSTECTOMY;  Surgeon: [name], MD;  Location: [facility];  Service: General;  Laterality: N/A;  . COLONOSCOPY    . ESOPHAGOGASTRODUODENOSCOPY    . NO PAST SURGERIES      No Known Allergies  Current Outpatient Prescriptions on File Prior to Visit  Medication Sig Dispense Refill  . aspirin 325 MG tablet Take 1 tablet (325 mg total) by mouth daily. 30 tablet 1  . atorvastatin (LIPITOR) 40 MG tablet Take 1 tablet (40 mg total) by mouth daily at 6 PM. 30 tablet 1  . Cholecalciferol (VITAMIN D) 2000 UNITS CAPS Take 1 capsule by mouth daily.    [name] FLUoxetine (PROZAC) 20 MG capsule Take 20 mg by mouth daily.    [name]  glipiZIDE (GLUCOTROL) 10 MG tablet Take 10 mg by mouth daily.     [name] lamoTRIgine (LAMICTAL) 200 MG tablet Take 400 mg by mouth at bedtime.     [name] LORazepam (ATIVAN) 1 MG tablet Take 1 mg by mouth every 8 (eight) hours.    [name] losartan (COZAAR) 100 MG tablet Take 1 tablet (100 mg total) by mouth daily. 90 tablet 3  . metFORMIN (GLUCOPHAGE) 1000 MG tablet Take 1,000 mg by mouth 2 (two) times daily with a meal.      . Tamsulosin HCl (FLOMAX) 0.4 MG CAPS Take 0.4 mg by mouth 2 (two) times daily.    . metoprolol tartrate (LOPRESSOR) 25 MG tablet Take 1 tablet (25 mg total) by mouth 2 (two) times daily. 60 tablet 11   No current facility-administered medications on file prior to visit.        Objective:   Physical Exam Blood pressure 100/70, pulse 72, temperature 97.7 F (36.5 C), height 5\' 9"  (1.753 m), weight 203 lb 3.2 oz (92.2 kg). Alert and oriented. Skin warm and dry. Oral mucosa is moist.   . Sclera anicteric, conjunctivae is pink. Thyroid not enlarged. No cervical lymphadenopathy. Lungs clear. Heart regular rate and rhythm.  Abdomen is soft. Bowel sounds are positive. No hepatomegaly.  No abdominal masses felt. Slight soreness to RUQ.   No edema to lower extremities.   75 cc yellow fluid in JP.         Assessment & Plan:  Possible bile. ERCP with stent. I discussed with Dr. Laural Golden.

## 2016-09-15 NOTE — Patient Instructions (Signed)
ERCP

## 2016-09-16 ENCOUNTER — Encounter (HOSPITAL_COMMUNITY): Payer: Self-pay

## 2016-09-16 ENCOUNTER — Encounter (HOSPITAL_COMMUNITY)
Admission: RE | Admit: 2016-09-16 | Discharge: 2016-09-16 | Disposition: A | Discharge status: Home / Self Care | Payer: Medicare Other | Source: Ambulatory Visit | Attending: Internal Medicine | Admitting: Internal Medicine

## 2016-09-17 ENCOUNTER — Encounter (HOSPITAL_COMMUNITY): Payer: Self-pay | Admitting: *Deleted

## 2016-09-17 ENCOUNTER — Ambulatory Visit (HOSPITAL_COMMUNITY)
Admission: RE | Admit: 2016-09-17 | Discharge: 2016-09-17 | Disposition: A | Discharge status: Home / Self Care | Payer: Medicare Other | Source: Ambulatory Visit | Attending: Internal Medicine | Admitting: Internal Medicine

## 2016-09-17 ENCOUNTER — Encounter (HOSPITAL_COMMUNITY)
Admission: RE | Disposition: A | Discharge status: Home / Self Care | Payer: Self-pay | Source: Ambulatory Visit | Attending: Internal Medicine

## 2016-09-17 ENCOUNTER — Ambulatory Visit (HOSPITAL_COMMUNITY): Payer: Medicare Other

## 2016-09-17 ENCOUNTER — Ambulatory Visit (HOSPITAL_COMMUNITY): Payer: Medicare Other | Admitting: Anesthesiology

## 2016-09-17 DIAGNOSIS — K3189 Other diseases of stomach and duodenum: Secondary | ICD-10-CM | POA: Diagnosis not present

## 2016-09-17 DIAGNOSIS — K839 Disease of biliary tract, unspecified: Secondary | ICD-10-CM | POA: Diagnosis not present

## 2016-09-17 DIAGNOSIS — N400 Enlarged prostate without lower urinary tract symptoms: Secondary | ICD-10-CM | POA: Insufficient documentation

## 2016-09-17 DIAGNOSIS — K219 Gastro-esophageal reflux disease without esophagitis: Secondary | ICD-10-CM | POA: Insufficient documentation

## 2016-09-17 DIAGNOSIS — F319 Bipolar disorder, unspecified: Secondary | ICD-10-CM | POA: Insufficient documentation

## 2016-09-17 DIAGNOSIS — E669 Obesity, unspecified: Secondary | ICD-10-CM | POA: Insufficient documentation

## 2016-09-17 DIAGNOSIS — Z8673 Personal history of transient ischemic attack (TIA), and cerebral infarction without residual deficits: Secondary | ICD-10-CM | POA: Diagnosis not present

## 2016-09-17 DIAGNOSIS — Z7982 Long term (current) use of aspirin: Secondary | ICD-10-CM | POA: Diagnosis not present

## 2016-09-17 DIAGNOSIS — I739 Peripheral vascular disease, unspecified: Secondary | ICD-10-CM | POA: Insufficient documentation

## 2016-09-17 DIAGNOSIS — E119 Type 2 diabetes mellitus without complications: Secondary | ICD-10-CM | POA: Diagnosis not present

## 2016-09-17 DIAGNOSIS — E785 Hyperlipidemia, unspecified: Secondary | ICD-10-CM | POA: Insufficient documentation

## 2016-09-17 DIAGNOSIS — K819 Cholecystitis, unspecified: Secondary | ICD-10-CM | POA: Diagnosis not present

## 2016-09-17 DIAGNOSIS — K838 Other specified diseases of biliary tract: Secondary | ICD-10-CM | POA: Diagnosis not present

## 2016-09-17 DIAGNOSIS — Z7984 Long term (current) use of oral hypoglycemic drugs: Secondary | ICD-10-CM | POA: Diagnosis not present

## 2016-09-17 DIAGNOSIS — I10 Essential (primary) hypertension: Secondary | ICD-10-CM | POA: Diagnosis not present

## 2016-09-17 DIAGNOSIS — Z79899 Other long term (current) drug therapy: Secondary | ICD-10-CM | POA: Diagnosis not present

## 2016-09-17 DIAGNOSIS — K9189 Other postprocedural complications and disorders of digestive system: Secondary | ICD-10-CM | POA: Diagnosis not present

## 2016-09-17 HISTORY — PX: BILIARY STENT PLACEMENT: SHX5538

## 2016-09-17 HISTORY — PX: SPHINCTEROTOMY: SHX5544

## 2016-09-17 HISTORY — PX: ERCP: SHX5425

## 2016-09-17 LAB — GLUCOSE, CAPILLARY
Glucose-Capillary: 172 mg/dL — ABNORMAL HIGH (ref 65–99)
Glucose-Capillary: 225 mg/dL — ABNORMAL HIGH (ref 65–99)

## 2016-09-17 SURGERY — ERCP, WITH INTERVENTION IF INDICATED
Anesthesia: General | Site: Esophagus

## 2016-09-17 MED ORDER — CHLORHEXIDINE GLUCONATE CLOTH 2 % EX PADS: 6.0000 | MEDICATED_PAD | Freq: Once | CUTANEOUS | Status: DC

## 2016-09-17 MED ORDER — SUCCINYLCHOLINE 20MG/ML (10ML) SYRINGE FOR MEDFUSION PUMP - OPTIME
INTRAMUSCULAR | Status: DC | PRN
  Administered 2016-09-17: 120 mg via INTRAVENOUS

## 2016-09-17 MED ORDER — PHENYLEPHRINE 40 MCG/ML (10ML) SYRINGE FOR IV PUSH (FOR BLOOD PRESSURE SUPPORT)
PREFILLED_SYRINGE | INTRAVENOUS | Status: AC
  Filled 2016-09-17: qty 10

## 2016-09-17 MED ORDER — PHENYLEPHRINE HCL 10 MG/ML IJ SOLN
INTRAMUSCULAR | Status: DC | PRN
  Administered 2016-09-17: 40 ug via INTRAVENOUS
  Administered 2016-09-17 (×4): 80 ug via INTRAVENOUS
  Administered 2016-09-17: 40 ug via INTRAVENOUS
  Administered 2016-09-17: 80 ug via INTRAVENOUS

## 2016-09-17 MED ORDER — IOPAMIDOL (ISOVUE-M 300) INJECTION 61%
INTRAMUSCULAR | Status: DC | PRN
  Administered 2016-09-17: 15 mL via INTRATHECAL

## 2016-09-17 MED ORDER — PROPOFOL 10 MG/ML IV BOLUS
INTRAVENOUS | Status: DC | PRN
  Administered 2016-09-17: 50 mg via INTRAVENOUS
  Administered 2016-09-17: 150 mg via INTRAVENOUS

## 2016-09-17 MED ORDER — HYDROMORPHONE HCL 1 MG/ML IJ SOLN: 0.2500 mg | INTRAMUSCULAR | Status: DC | PRN

## 2016-09-17 MED ORDER — LIDOCAINE HCL (CARDIAC) 10 MG/ML IV SOLN
INTRAVENOUS | Status: DC | PRN
  Administered 2016-09-17: 40 mg via INTRAVENOUS

## 2016-09-17 MED ORDER — IOPAMIDOL (ISOVUE-300) INJECTION 61%
INTRAVENOUS | Status: AC
  Filled 2016-09-17: qty 50

## 2016-09-17 MED ORDER — PHENYLEPHRINE HCL 10 MG/ML IJ SOLN
INTRAMUSCULAR | Status: AC
  Filled 2016-09-17: qty 1

## 2016-09-17 MED ORDER — LIDOCAINE HCL (PF) 1 % IJ SOLN
INTRAMUSCULAR | Status: AC
  Filled 2016-09-17: qty 5

## 2016-09-17 MED ORDER — FENTANYL CITRATE (PF) 250 MCG/5ML IJ SOLN
INTRAMUSCULAR | Status: AC
  Filled 2016-09-17: qty 5

## 2016-09-17 MED ORDER — NEOSTIGMINE METHYLSULFATE 10 MG/10ML IV SOLN
INTRAVENOUS | Status: AC
  Filled 2016-09-17: qty 1

## 2016-09-17 MED ORDER — EPHEDRINE SULFATE 50 MG/ML IJ SOLN
INTRAMUSCULAR | Status: DC | PRN
  Administered 2016-09-17 (×2): 5 mg via INTRAVENOUS
  Administered 2016-09-17 (×2): 10 mg via INTRAVENOUS

## 2016-09-17 MED ORDER — GLYCOPYRROLATE 0.2 MG/ML IJ SOLN
INTRAMUSCULAR | Status: AC
  Filled 2016-09-17: qty 2

## 2016-09-17 MED ORDER — ROCURONIUM 10MG/ML (10ML) SYRINGE FOR MEDFUSION PUMP - OPTIME
INTRAVENOUS | Status: DC | PRN
  Administered 2016-09-17: 8 mg via INTRAVENOUS
  Administered 2016-09-17 (×2): 10 mg via INTRAVENOUS
  Administered 2016-09-17: 22 mg via INTRAVENOUS

## 2016-09-17 MED ORDER — GLUCAGON HCL RDNA (DIAGNOSTIC) 1 MG IJ SOLR
INTRAMUSCULAR | Status: DC | PRN
  Administered 2016-09-17: .25 mg via INTRAVENOUS
  Administered 2016-09-17: 0.25 mg via INTRAVENOUS

## 2016-09-17 MED ORDER — PROPOFOL 10 MG/ML IV BOLUS
INTRAVENOUS | Status: AC
  Filled 2016-09-17: qty 20

## 2016-09-17 MED ORDER — GLUCAGON HCL RDNA (DIAGNOSTIC) 1 MG IJ SOLR
INTRAMUSCULAR | Status: AC
  Filled 2016-09-17: qty 2

## 2016-09-17 MED ORDER — MIDAZOLAM HCL 2 MG/2ML IJ SOLN
INTRAMUSCULAR | Status: AC
  Filled 2016-09-17: qty 2

## 2016-09-17 MED ORDER — SIMETHICONE 40 MG/0.6ML PO SUSP
ORAL | Status: AC
  Filled 2016-09-17: qty 0.6

## 2016-09-17 MED ORDER — SODIUM CHLORIDE 0.9 % IJ SOLN
INTRAMUSCULAR | Status: AC
  Filled 2016-09-17: qty 10

## 2016-09-17 MED ORDER — CEFAZOLIN SODIUM-DEXTROSE 2-4 GM/100ML-% IV SOLN
2.0000 g | INTRAVENOUS | Status: AC
  Administered 2016-09-17: 2 g via INTRAVENOUS
  Filled 2016-09-17: qty 100

## 2016-09-17 MED ORDER — DEXAMETHASONE SODIUM PHOSPHATE 4 MG/ML IJ SOLN
4.0000 mg | Freq: Once | INTRAMUSCULAR | Status: AC
  Administered 2016-09-17: 4 mg via INTRAVENOUS

## 2016-09-17 MED ORDER — ROCURONIUM BROMIDE 50 MG/5ML IV SOLN
INTRAVENOUS | Status: AC
  Filled 2016-09-17: qty 1

## 2016-09-17 MED ORDER — METOCLOPRAMIDE HCL 5 MG/ML IJ SOLN
10.0000 mg | Freq: Once | INTRAMUSCULAR | Status: AC
  Administered 2016-09-17: 10 mg via INTRAVENOUS
  Filled 2016-09-17: qty 2

## 2016-09-17 MED ORDER — STERILE WATER FOR IRRIGATION IR SOLN
Status: DC | PRN
  Administered 2016-09-17: 75 mL

## 2016-09-17 MED ORDER — FENTANYL CITRATE (PF) 100 MCG/2ML IJ SOLN
INTRAMUSCULAR | Status: DC | PRN
  Administered 2016-09-17 (×3): 50 ug via INTRAVENOUS

## 2016-09-17 MED ORDER — MIDAZOLAM HCL 2 MG/2ML IJ SOLN
1.0000 mg | INTRAMUSCULAR | Status: AC
  Administered 2016-09-17: 2 mg via INTRAVENOUS

## 2016-09-17 MED ORDER — GLYCOPYRROLATE 0.2 MG/ML IJ SOLN
0.2000 mg | Freq: Once | INTRAMUSCULAR | Status: AC
  Administered 2016-09-17: 0.2 mg via INTRAVENOUS

## 2016-09-17 MED ORDER — DEXAMETHASONE SODIUM PHOSPHATE 4 MG/ML IJ SOLN
INTRAMUSCULAR | Status: AC
  Filled 2016-09-17: qty 1

## 2016-09-17 MED ORDER — GLYCOPYRROLATE 0.2 MG/ML IJ SOLN
INTRAMUSCULAR | Status: AC
  Filled 2016-09-17: qty 1

## 2016-09-17 MED ORDER — SUCCINYLCHOLINE CHLORIDE 20 MG/ML IJ SOLN
INTRAMUSCULAR | Status: AC
  Filled 2016-09-17: qty 1

## 2016-09-17 MED ORDER — GLYCOPYRROLATE 0.2 MG/ML IJ SOLN
INTRAMUSCULAR | Status: DC | PRN
  Administered 2016-09-17: .5 mg via INTRAVENOUS

## 2016-09-17 MED ORDER — NEOSTIGMINE METHYLSULFATE 10 MG/10ML IV SOLN
INTRAVENOUS | Status: DC | PRN
  Administered 2016-09-17: 2.5 mg via INTRAVENOUS

## 2016-09-17 MED ORDER — LACTATED RINGERS IV SOLN
INTRAVENOUS | Status: DC
  Administered 2016-09-17: 1000 mL via INTRAVENOUS
  Administered 2016-09-17: 09:00:00 via INTRAVENOUS

## 2016-09-17 NOTE — Anesthesia Preprocedure Evaluation (Signed)
Anesthesia Evaluation  Patient identified by MRN, date of birth, ID band Patient awake    Reviewed: Allergy & Precautions, NPO status , Patient's Chart, lab work & pertinent test results  Airway Mallampati: III  TM Distance: <3 FB   Mouth opening: Limited Mouth Opening  Dental  (+) Teeth Intact   Pulmonary neg pulmonary ROS,    breath sounds clear to auscultation       Cardiovascular hypertension, Pt. on medications + Peripheral Vascular Disease   Rhythm:Regular Rate:Normal     Neuro/Psych PSYCHIATRIC DISORDERS Bipolar Disorder TIACVA, Residual Symptoms    GI/Hepatic hiatal hernia, GERD  ,  Endo/Other  diabetes, Type 2, Oral Hypoglycemic Agents  Renal/GU      Musculoskeletal   Abdominal   Peds  Hematology   Anesthesia Other Findings   Reproductive/Obstetrics                             Anesthesia Physical Anesthesia Plan  ASA: III  Anesthesia Plan: General   Post-op Pain Management:    Induction: Intravenous, Rapid sequence and Cricoid pressure planned  Airway Management Planned: Oral ETT and Video Laryngoscope Planned  Additional Equipment:   Intra-op Plan:   Post-operative Plan: Extubation in OR  Informed Consent: I have reviewed the patients History and Physical, chart, labs and discussed the procedure including the risks, benefits and alternatives for the proposed anesthesia with the patient or authorized representative who has indicated his/her understanding and acceptance.     Plan Discussed with:   Anesthesia Plan Comments:         Anesthesia Quick Evaluation

## 2016-09-17 NOTE — Transfer of Care (Signed)
Immediate Anesthesia Transfer of Care Note  Patient: Robert Walters  Procedure(s) Performed: Procedure(s) with comments: ENDOSCOPIC RETROGRADE CHOLANGIOPANCREATOGRAPHY (ERCP) with sphinctorotomy (N/A) - [place] (N/A)  Patient Location: PACU  Anesthesia Type:General  Level of Consciousness: awake and alert   Airway & Oxygen Therapy: Patient Spontanous Breathing and Patient connected to face mask oxygen  Post-op Assessment: Report given to RN and Post -op Vital signs reviewed and stable  Post vital signs: Reviewed and stable  Last Vitals:  Vitals:   09/17/16 0720 09/17/16 0725  BP: 131/84 120/77  Pulse:    Resp: 20 12  Temp:      Last Pain:  Vitals:   09/17/16 0638  TempSrc: Oral  PainSc: 2       Patients Stated Pain Goal: 8 (64/33/29 5188)  Complications: No apparent anesthesia complications

## 2016-09-17 NOTE — Discharge Instructions (Signed)
No aspirin or NSAIDs for 3 days. Resume other medications as before. Clear liquids for 24 hours. No driving for 24 hours. Office visit with Dr. Arnoldo Morale for JP drain removal next week. Office visit with Dr. Laural Golden in 4 weeks.

## 2016-09-17 NOTE — H&P (Signed)
Robert Walters is an 61 y.o. male.   Chief Complaint: Patient is here for ERCP with biliary stenting. HPI: Patient is 61 year old Caucasian male with multiple medical problems including history of diabetes mellitus CVA last year who underwent laparoscopic cholecystectomy on 08/25/2016 and was found to have gangrenous gallbladder with empyema. JP drain was placed. He continues to have biliary drainage. He has noticed some soreness about the JP drain but he denies sharp pain. His appetite has improved. He states he lost 20 pounds during this sickness. He denies fever or chills. Last week he had diarrhea after he took milk of magnesia but he has not had a bowel movement in 4-5 days. He has history of GERD. He has sporadic heartburn while on therapy. He denies dysphagia. Last aspirin dose was 2 days ago. He is on full dose aspirin.  Past Medical History:  Diagnosis Date  . Acid reflux disease   . Bipolar 1 disorder, mixed ([place])   . BPH (benign prostatic hypertrophy)   . CVA (cerebral vascular accident) ([place]) 07/2015  . Gastritis       . Hyperlipidemia   . Hypertension   . Normal nuclear stress test Dec 2011  . Obesity   . TIA (transient ischemic attack) 05/15/2012     . Type II diabetes mellitus ([place])   . Vertigo        History of colonic polyps.  Past Surgical History:  Procedure Laterality Date  . CARDIOVASCULAR STRESS TEST  2009   EF 63%  . CHOLECYSTECTOMY N/A 08/25/2016   Procedure: LAPAROSCOPIC CHOLECYSTECTOMY;  Surgeon: [name], MD;  Location: [facility];  Service: General;  Laterality: N/A;  . COLONOSCOPY    . ESOPHAGOGASTRODUODENOSCOPY    . NO PAST SURGERIES      Family History  Problem Relation Age of Onset  . Hyperlipidemia Father        in a nursing home  . Colon cancer Father   . Stroke Mother 75   Social History:  reports that he has never smoked. He has never used smokeless tobacco. He reports that he does not drink alcohol or use drugs.  Allergies: Not on  File  Medications Prior to Admission  Medication Sig Dispense Refill  . aspirin 325 MG tablet Take 1 tablet (325 mg total) by mouth daily. 30 tablet 1  . atorvastatin (LIPITOR) 40 MG tablet Take 1 tablet (40 mg total) by mouth daily at 6 PM. 30 tablet 1  . Cholecalciferol (VITAMIN D) 2000 UNITS CAPS Take 1 capsule by mouth daily.    [name] FLUoxetine (PROZAC) 20 MG capsule Take 20 mg by mouth daily.    [name] glipiZIDE (GLUCOTROL) 10 MG tablet Take 10 mg by mouth daily.     [name] lamoTRIgine (LAMICTAL) 200 MG tablet Take 400 mg by mouth at bedtime.     [name] LORazepam (ATIVAN) 1 MG tablet Take 1 mg by mouth every 8 (eight) hours.    [name] losartan (COZAAR) 100 MG tablet Take 1 tablet (100 mg total) by mouth daily. 90 tablet 3  . metFORMIN (GLUCOPHAGE) 1000 MG tablet Take 1,000 mg by mouth 2 (two) times daily with a meal.      . metoprolol tartrate (LOPRESSOR) 25 MG tablet Take 25 mg by mouth 2 (two) times daily.    . Tamsulosin HCl (FLOMAX) 0.4 MG CAPS Take 0.4 mg by mouth 2 (two) times daily.    . metoprolol tartrate (LOPRESSOR) 25 MG tablet Take 1 tablet (25 mg total) by  mouth 2 (two) times daily. 60 tablet 11    Results for orders placed or performed during the hospital encounter of 09/17/16 (from the past 48 hour(s))  Glucose, capillary     Status: Abnormal   Collection Time: 09/17/16  6:29 AM  Result Value Ref Range   Glucose-Capillary 172 (H) 65 - 99 mg/dL   No results found.  ROS  Blood pressure (!) 138/98, pulse 94, temperature 98 F (36.7 C), temperature source Oral, resp. rate 14, height 5\' 9"  (1.753 m), weight 203 lb (92.1 kg), SpO2 96 %. Physical Exam  Constitutional: He appears well-developed and well-nourished.  HENT:  Mouth/Throat: Oropharynx is clear and moist.  Eyes: Conjunctivae are normal. No scleral icterus.  Neck: No thyromegaly present.  Cardiovascular: Normal rate, regular rhythm and normal heart sounds.   No murmur heard. Respiratory: Effort normal and breath sounds normal.   GI:  Abdomen is full. JP drain entry site noted below the right costal margin laterally. There is 10 ML of bile in the bag. He emptied it this morning. Abdomen is soft without organomegaly or masses. Mild tenderness noted around JP entry site.  Musculoskeletal: He exhibits no edema.  Lymphadenopathy:    He has no cervical adenopathy.  Neurological: He is alert.  Skin: Skin is warm and dry.     Assessment/Plan Patient has biliary fistula. He is status post laparoscopic cholecystectomy about 3 weeks ago for gangrenous gallbladder with empyema. JP drain in place draining bile. He would therefore benefit from ERCP with biliary stenting Procedure risks reviewed with the patient and he is agreeable.   Hildred Laser, MD 09/17/2016, 7:14 AM

## 2016-09-17 NOTE — Op Note (Signed)
[facility] Patient Name: Robert Walters Procedure Date: 09/17/2016 8:05 AM MRN: [id number] Date of Birth: [birth year] Attending MD: Hildred Laser , MD CSN: [id number] Age: 61 Admit Type: Outpatient Procedure:                ERCP Indications:              Bile leak, Treatment of bile leak Providers:                Hildred Laser, MD, Otis Peak B. Sharon Seller, RN, Randa Spike, Technician Referring MD:             [name], MD Medicines:                General Anesthesia Complications:            No immediate complications. Estimated Blood Loss:     Estimated blood loss: none. Procedure:                Pre-Anesthesia Assessment:                           - Prior to the procedure, a History and Physical                            was performed, and patient medications and                            allergies were reviewed. The patient's tolerance of                            previous anesthesia was also reviewed. The risks                            and benefits of the procedure and the sedation                            options and risks were discussed with the patient.                            All questions were answered, and informed consent                            was obtained. Prior Anticoagulants: The patient                            last took aspirin 2 days prior to the procedure.                            ASA Grade Assessment: III - A patient with severe                            systemic disease. After reviewing the risks and  benefits, the patient was deemed in satisfactory                            condition to undergo the procedure.                           After obtaining informed consent, the scope was                            passed under direct vision. Throughout the                            procedure, the patient's blood pressure, pulse, and                            oxygen saturations were  monitored continuously. The                            [id number] ([id number]) scope was introduced through                            the mouth, and used to inject contrast into and                            used to inject contrast into the bile duct. The                            ERCP was technically difficult and complex due to                            challenging cannulation because of abnormal                            anatomy. Successful completion of the procedure was                            aided by ampulla of Vater identified by Q scope. Scope In: 8:12:33 AM Scope Out: 9:02:44 AM Total Procedure Duration: 0 hours 50 minutes 11 seconds  Findings:      The scout film was normal. A standard esophagogastroduodenoscopy scope       was used for the examination of the upper gastrointestinal tract. The       scope was passed under direct vision through the upper GI tract. The       examined esophagus was normal. Food was found in the gastric body. A       mild post-ulcer deformity was found in the first portion of the duodenum       and in the second portion of the duodenum. The major papilla was normal.       it was marked by biopsy next to it. The bile duct was deeply cannulated       with the Autotome sphincterotome. Contrast was injected. I personally       interpreted the bile duct images. Ductal flow of contrast was adequate.       Image quality was adequate.  Extravasation of contrast originating from       the cystic duct was observed. The biliary tree was otherwise normal. A 5       mm biliary sphincterotomy was made with a braided Autotome       sphincterotome using ERBE electrocautery. There was no       post-sphincterotomy bleeding. One 10 Fr by 7 cm plastic stent with two       internal flaps was placed 1 cm into the common bile duct. Bile flowed       through the stent. The stent was in good position. Impression:               - Normal esophagus.                            - Food was found in the stomach.                           - Duodenal deformity without stenosis possibly due                            to prior peptic ulcer disease                           - The major papilla appeared normal.                           - A bile leak was found at cystic duct remnant.                           - A biliary sphincterotomy was performed.                           - One plastic stent was placed into the common bile                            duct. Moderate Sedation:      Per Anesthesia Care Recommendation:           - Avoid aspirin and nonsteroidal anti-inflammatory                            medicines for 3 days.                           - Discharge patient to home.                           - Clear liquid diet today.                           - Diabetic ([name]) diet tomorrow.                           - Follow-up with Dr. Arnoldo Morale next week                           - Return to GI clinic  in 4 weeks.                           - Stent removal at ERCP in 8 weeks. Procedure Code(s):        --- Professional ---                           [phone], Endoscopic retrograde                            cholangiopancreatography (ERCP); with placement of                            endoscopic stent into biliary or pancreatic duct,                            including pre- and post-dilation and guide wire                            passage, when performed, including sphincterotomy,                            when performed, each stent Diagnosis Code(s):        --- Professional ---                           Q25.9DGL, Foreign body in stomach, initial encounter                           K31.89, Other diseases of stomach and duodenum                           K83.9, Disease of biliary tract, unspecified                           K83.8, Other specified diseases of biliary tract CPT copyright 2016 American Medical Association. All rights reserved. The codes documented in this  report are preliminary and upon coder review may  be revised to meet current compliance requirements. Hildred Laser, MD Hildred Laser, MD 09/17/2016 9:28:26 AM This report has been signed electronically. Number of Addenda: 0

## 2016-09-17 NOTE — OR Nursing (Signed)
Discharged in care of friend, Robert Walters. Patient alert in stable condition. No pain, no drainage, dressing intact. Cane, phone, watch, bracelet with patient. Neighbor obtained car keys from patient to get his car.

## 2016-09-17 NOTE — Anesthesia Postprocedure Evaluation (Signed)
Anesthesia Post Note  Patient: Robert Walters  Procedure(s) Performed: Procedure(s) (LRB): ENDOSCOPIC RETROGRADE CHOLANGIOPANCREATOGRAPHY (ERCP) with sphinctorotomy (N/A) BILIARY STENT PLACEMENT (N/A)  Patient location during evaluation: PACU Anesthesia Type: General Level of consciousness: awake and alert and oriented Pain management: pain level controlled Vital Signs Assessment: post-procedure vital signs reviewed and stable Respiratory status: spontaneous breathing Cardiovascular status: blood pressure returned to baseline Postop Assessment: no signs of nausea or vomiting Anesthetic complications: no     Last Vitals:  Vitals:   09/17/16 0945 09/17/16 1000  BP: 120/70 118/65  Pulse: 91 88  Resp: (!) 6 (!) 21  Temp:      Last Pain:  Vitals:   09/17/16 0638  TempSrc: Oral  PainSc: 2                  Kiyla Ringler

## 2016-09-17 NOTE — OR Nursing (Signed)
JP drain draining bile approx 30 ml in drain. Dressing dry and intact

## 2016-09-17 NOTE — Anesthesia Procedure Notes (Signed)
Procedure Name: Intubation Date/Time: 09/17/2016 7:44 AM Performed by: Tressie Stalker E Pre-anesthesia Checklist: Patient identified, Patient being monitored, Timeout performed, Emergency Drugs available and Suction available Patient Re-evaluated:Patient Re-evaluated prior to inductionOxygen Delivery Method: Circle system utilized Preoxygenation: Pre-oxygenation with 100% oxygen Intubation Type: IV induction, Rapid sequence and Cricoid Pressure applied Ventilation: Mask ventilation without difficulty Grade View: Grade I Tube type: Oral Tube size: 7.0 mm Number of attempts: 1 Airway Equipment and Method: Video-laryngoscopy and Rigid stylet Placement Confirmation: ETT inserted through vocal cords under direct vision,  positive ETCO2 and breath sounds checked- equal and bilateral Secured at: 21 cm Tube secured with: Tape Dental Injury: Teeth and Oropharynx as per pre-operative assessment  Comments: Dr. Patsey Berthold present at induction, after successfulintubation,air leak noted in ETT cuff.  Tube removed and reintubatied without difficulty, same technique.

## 2016-09-20 ENCOUNTER — Encounter (HOSPITAL_COMMUNITY): Payer: Self-pay | Admitting: Internal Medicine

## 2016-09-23 ENCOUNTER — Encounter: Payer: Self-pay | Admitting: General Surgery

## 2016-09-23 ENCOUNTER — Ambulatory Visit (INDEPENDENT_AMBULATORY_CARE_PROVIDER_SITE_OTHER): Payer: Self-pay | Admitting: General Surgery

## 2016-09-23 VITALS — BP 141/79 | HR 100 | Temp 97.1°F | Resp 18 | Ht 69.0 in | Wt 208.0 lb

## 2016-09-23 DIAGNOSIS — Z09 Encounter for follow-up examination after completed treatment for conditions other than malignant neoplasm: Secondary | ICD-10-CM

## 2016-09-23 NOTE — Progress Notes (Signed)
Subjective:     Robert Walters  status post ERCP with stent placement. This was done last week. JP drainage has been minimal since that time.  Objective:    BP (!) 141/79   Pulse 100   Temp 97.1 F (36.2 C)   Resp 18   Ht 5\' 9"  (1.753 m)   Wt 208 lb (94.3 kg)   BMI 30.72 kg/m   General:  alert, cooperative and no distress  Abdomen is soft. Minimal serous drainage in JP drain. JP drain removed.     Assessment:    Doing well postoperatively.    Plan:  2 follow-up ERCP and stent removal by Dr. Laural Golden in 8 weeks. Follow-up here as needed.

## 2016-09-30 DIAGNOSIS — I10 Essential (primary) hypertension: Secondary | ICD-10-CM | POA: Diagnosis not present

## 2016-09-30 DIAGNOSIS — T8130XS Disruption of wound, unspecified, sequela: Secondary | ICD-10-CM | POA: Diagnosis not present

## 2016-09-30 DIAGNOSIS — E559 Vitamin D deficiency, unspecified: Secondary | ICD-10-CM | POA: Diagnosis not present

## 2016-09-30 DIAGNOSIS — E782 Mixed hyperlipidemia: Secondary | ICD-10-CM | POA: Diagnosis not present

## 2016-09-30 DIAGNOSIS — E1165 Type 2 diabetes mellitus with hyperglycemia: Secondary | ICD-10-CM | POA: Diagnosis not present

## 2016-09-30 DIAGNOSIS — E785 Hyperlipidemia, unspecified: Secondary | ICD-10-CM | POA: Diagnosis not present

## 2016-09-30 DIAGNOSIS — K810 Acute cholecystitis: Secondary | ICD-10-CM | POA: Diagnosis not present

## 2016-10-04 DIAGNOSIS — I739 Peripheral vascular disease, unspecified: Secondary | ICD-10-CM | POA: Diagnosis not present

## 2016-10-13 DIAGNOSIS — F3181 Bipolar II disorder: Secondary | ICD-10-CM | POA: Diagnosis not present

## 2016-10-13 DIAGNOSIS — E1165 Type 2 diabetes mellitus with hyperglycemia: Secondary | ICD-10-CM | POA: Diagnosis not present

## 2016-10-19 ENCOUNTER — Encounter (INDEPENDENT_AMBULATORY_CARE_PROVIDER_SITE_OTHER): Payer: Self-pay | Admitting: Internal Medicine

## 2016-10-19 ENCOUNTER — Ambulatory Visit (INDEPENDENT_AMBULATORY_CARE_PROVIDER_SITE_OTHER): Payer: Medicare Other | Admitting: Internal Medicine

## 2016-10-19 VITALS — BP 120/84 | HR 72 | Temp 97.7°F | Ht 69.0 in | Wt 211.3 lb

## 2016-10-19 DIAGNOSIS — K839 Disease of biliary tract, unspecified: Secondary | ICD-10-CM | POA: Diagnosis not present

## 2016-10-19 NOTE — Patient Instructions (Addendum)
Will schedule an ERCP with stent removal in July.  Stool softener Neosporin to area where JP drain was placed.

## 2016-10-19 NOTE — Progress Notes (Signed)
Subjective:    Patient ID: Robert Walters, male    DOB: [birth year], 61 y.o.   MRN: [id number]  HPI Here today for f/u. Seen in May as a referral for a bile leak/ERCP.  In April underwent a laparoscopic cholecystectomy by Dr. Arnoldo Morale.  Had had an excessive amt of drainage from JP drain.  On 09/17/2016 he underwent an ERCP for treatment of bile leak.  During ERCP a bile leak was found at cystic duct remnant. A biliary sphincterotomy was performed.  One plastic stent was placed in to the CBD.  Repeat ERCP for stent removal in 8 weeks.  He states he is doing good.  Home health has been discontinued. His appetite is good for the most part. There is no abdominal pain. He says he cannot sleep on his rt side.  States he has some tenderness with palpation.   Review of Systems Past Medical History:  Diagnosis Date  . Acid reflux disease   . Bipolar 1 disorder, mixed ([place])   . BPH (benign prostatic hypertrophy)   . CVA (cerebral vascular accident) ([place]) 07/2015  . Gastritis   . H/O hiatal hernia   . Hyperlipidemia   . Hypertension   . Normal nuclear stress test Dec 2011  . Obesity   . TIA (transient ischemic attack) 05/15/2012   "they think I've had one this am @ 0130" (05/15/2012)  . Type II diabetes mellitus ([place])   . Vertigo     Past Surgical History:  Procedure Laterality Date  . BILIARY STENT PLACEMENT N/A 09/17/2016   Procedure: BILIARY STENT PLACEMENT;  Surgeon: [name], MD;  Location: [facility];  Service: Endoscopy;  Laterality: N/A;  . CARDIOVASCULAR STRESS TEST  2009   EF 63%  . CHOLECYSTECTOMY N/A 08/25/2016   Procedure: LAPAROSCOPIC CHOLECYSTECTOMY;  Surgeon: [name], MD;  Location: [facility];  Service: General;  Laterality: N/A;  . COLONOSCOPY    . ERCP N/A 09/17/2016   Procedure: ENDOSCOPIC RETROGRADE CHOLANGIOPANCREATOGRAPHY (ERCP);  Surgeon: [name], MD;  Location: [facility];  Service: Endoscopy;  Laterality: N/A;  730  . ESOPHAGOGASTRODUODENOSCOPY     . NO PAST SURGERIES    . SPHINCTEROTOMY N/A 09/17/2016   Procedure: SPHINCTEROTOMY;  Surgeon: [name], MD;  Location: [facility];  Service: Endoscopy;  Laterality: N/A;    No Known Allergies  Current Outpatient Prescriptions on File Prior to Visit  Medication Sig Dispense Refill  . aspirin 325 MG tablet Take 1 tablet (325 mg total) by mouth daily. 30 tablet 1  . atorvastatin (LIPITOR) 40 MG tablet Take 1 tablet (40 mg total) by mouth daily at 6 PM. 30 tablet 1  . Cholecalciferol (VITAMIN D) 2000 UNITS CAPS Take 1 capsule by mouth daily.    [name] FLUoxetine (PROZAC) 20 MG capsule Take 20 mg by mouth daily.    [name] glipiZIDE (GLUCOTROL) 10 MG tablet Take 10 mg by mouth daily.     [name] lamoTRIgine (LAMICTAL) 200 MG tablet Take 400 mg by mouth at bedtime.     [name] LORazepam (ATIVAN) 1 MG tablet Take 1 mg by mouth every 8 (eight) hours.    [name] losartan (COZAAR) 100 MG tablet Take 1 tablet (100 mg total) by mouth daily. 90 tablet 3  . metFORMIN (GLUCOPHAGE) 1000 MG tablet Take 1,000 mg by mouth 2 (two) times daily with a meal.      . metoprolol tartrate (LOPRESSOR) 25 MG tablet Take 25 mg by mouth 2 (  two) times daily.    . Tamsulosin HCl (FLOMAX) 0.4 MG CAPS Take 0.4 mg by mouth 2 (two) times daily.    . metoprolol tartrate (LOPRESSOR) 25 MG tablet Take 1 tablet (25 mg total) by mouth 2 (two) times daily. 60 tablet 11   No current facility-administered medications on file prior to visit.         Objective:   Physical Exam Blood pressure 120/84, pulse 72, temperature 97.7 F (36.5 C), height 5\' 9"  (1.753 m), weight 211 lb 4.8 oz (95.8 kg). Alert and oriented. Skin warm and dry. Oral mucosa is moist.   . Sclera anicteric, conjunctivae is pink. Thyroid not enlarged. No cervical lymphadenopathy. Lungs clear. Heart regular rate and rhythm.  Abdomen is soft. Bowel sounds are positive. No hepatomegaly. No abdominal masses felt. Slight rt upper quadrant tenderness. Raw area where JP drain was.   No  edema to lower extremities.           Assessment & Plan:  Bile leak. Will have f/u ERCP with stent removal in July.  CMET today.  Stool softener daily. Keep neosporin to area where J P drain was placed.

## 2016-10-20 LAB — COMPREHENSIVE METABOLIC PANEL
ALK PHOS: 112 U/L (ref 40–115)
ALT: 23 U/L (ref 9–46)
AST: 18 U/L (ref 10–35)
Albumin: 4.2 g/dL (ref 3.6–5.1)
BUN: 24 mg/dL (ref 7–25)
CALCIUM: 9 mg/dL (ref 8.6–10.3)
CHLORIDE: 103 mmol/L (ref 98–110)
CO2: 22 mmol/L (ref 20–31)
Creat: 1.33 mg/dL — ABNORMAL HIGH (ref 0.70–1.25)
GLUCOSE: 140 mg/dL — AB (ref 65–99)
POTASSIUM: 4.8 mmol/L (ref 3.5–5.3)
Sodium: 138 mmol/L (ref 135–146)
Total Bilirubin: 0.4 mg/dL (ref 0.2–1.2)
Total Protein: 6.6 g/dL (ref 6.1–8.1)

## 2016-11-15 ENCOUNTER — Telehealth (INDEPENDENT_AMBULATORY_CARE_PROVIDER_SITE_OTHER): Payer: Self-pay | Admitting: Internal Medicine

## 2016-11-15 NOTE — Telephone Encounter (Signed)
Patient called, stated that he wants to know when he's going to be scheduled to have his stint removed.  [phone]

## 2016-11-15 NOTE — Telephone Encounter (Signed)
I;m waiting for Dr Laural Golden to give me some Friday's, I will call patient once I know what Friday's he is available

## 2016-11-16 ENCOUNTER — Encounter (INDEPENDENT_AMBULATORY_CARE_PROVIDER_SITE_OTHER): Payer: Self-pay | Admitting: *Deleted

## 2016-11-16 ENCOUNTER — Other Ambulatory Visit (INDEPENDENT_AMBULATORY_CARE_PROVIDER_SITE_OTHER): Payer: Self-pay | Admitting: Internal Medicine

## 2016-11-16 DIAGNOSIS — K839 Disease of biliary tract, unspecified: Secondary | ICD-10-CM

## 2016-11-29 ENCOUNTER — Encounter (HOSPITAL_COMMUNITY): Payer: Self-pay

## 2016-11-30 ENCOUNTER — Encounter (HOSPITAL_COMMUNITY)
Admission: RE | Admit: 2016-11-30 | Discharge: 2016-11-30 | Disposition: A | Discharge status: Home / Self Care | Payer: Medicare Other | Source: Ambulatory Visit | Attending: Internal Medicine | Admitting: Internal Medicine

## 2016-12-03 ENCOUNTER — Ambulatory Visit (HOSPITAL_COMMUNITY): Payer: Medicare Other | Admitting: Anesthesiology

## 2016-12-03 ENCOUNTER — Ambulatory Visit (HOSPITAL_COMMUNITY)
Admission: RE | Admit: 2016-12-03 | Discharge: 2016-12-03 | Disposition: A | Discharge status: Home / Self Care | Payer: Medicare Other | Source: Ambulatory Visit | Attending: Internal Medicine | Admitting: Internal Medicine

## 2016-12-03 ENCOUNTER — Encounter (HOSPITAL_COMMUNITY): Payer: Self-pay | Admitting: *Deleted

## 2016-12-03 ENCOUNTER — Ambulatory Visit (HOSPITAL_COMMUNITY): Payer: Medicare Other

## 2016-12-03 ENCOUNTER — Encounter (HOSPITAL_COMMUNITY)
Admission: RE | Disposition: A | Discharge status: Home / Self Care | Payer: Self-pay | Source: Ambulatory Visit | Attending: Internal Medicine

## 2016-12-03 DIAGNOSIS — E669 Obesity, unspecified: Secondary | ICD-10-CM | POA: Diagnosis not present

## 2016-12-03 DIAGNOSIS — Z7982 Long term (current) use of aspirin: Secondary | ICD-10-CM | POA: Insufficient documentation

## 2016-12-03 DIAGNOSIS — N400 Enlarged prostate without lower urinary tract symptoms: Secondary | ICD-10-CM | POA: Insufficient documentation

## 2016-12-03 DIAGNOSIS — E1151 Type 2 diabetes mellitus with diabetic peripheral angiopathy without gangrene: Secondary | ICD-10-CM | POA: Diagnosis not present

## 2016-12-03 DIAGNOSIS — Z7984 Long term (current) use of oral hypoglycemic drugs: Secondary | ICD-10-CM | POA: Insufficient documentation

## 2016-12-03 DIAGNOSIS — K219 Gastro-esophageal reflux disease without esophagitis: Secondary | ICD-10-CM | POA: Diagnosis not present

## 2016-12-03 DIAGNOSIS — F319 Bipolar disorder, unspecified: Secondary | ICD-10-CM | POA: Diagnosis not present

## 2016-12-03 DIAGNOSIS — Z4659 Encounter for fitting and adjustment of other gastrointestinal appliance and device: Secondary | ICD-10-CM | POA: Insufficient documentation

## 2016-12-03 DIAGNOSIS — K838 Other specified diseases of biliary tract: Secondary | ICD-10-CM | POA: Insufficient documentation

## 2016-12-03 DIAGNOSIS — K810 Acute cholecystitis: Secondary | ICD-10-CM | POA: Diagnosis not present

## 2016-12-03 DIAGNOSIS — E785 Hyperlipidemia, unspecified: Secondary | ICD-10-CM | POA: Diagnosis not present

## 2016-12-03 DIAGNOSIS — I10 Essential (primary) hypertension: Secondary | ICD-10-CM | POA: Insufficient documentation

## 2016-12-03 DIAGNOSIS — Z8673 Personal history of transient ischemic attack (TIA), and cerebral infarction without residual deficits: Secondary | ICD-10-CM | POA: Insufficient documentation

## 2016-12-03 DIAGNOSIS — Z4689 Encounter for fitting and adjustment of other specified devices: Secondary | ICD-10-CM

## 2016-12-03 DIAGNOSIS — K839 Disease of biliary tract, unspecified: Secondary | ICD-10-CM

## 2016-12-03 DIAGNOSIS — Z6831 Body mass index (BMI) 31.0-31.9, adult: Secondary | ICD-10-CM | POA: Diagnosis not present

## 2016-12-03 HISTORY — PX: GASTROINTESTINAL STENT REMOVAL: SHX6384

## 2016-12-03 HISTORY — PX: ERCP: SHX5425

## 2016-12-03 LAB — GLUCOSE, CAPILLARY
GLUCOSE-CAPILLARY: 219 mg/dL — AB (ref 65–99)
Glucose-Capillary: 147 mg/dL — ABNORMAL HIGH (ref 65–99)

## 2016-12-03 SURGERY — ERCP, WITH INTERVENTION IF INDICATED
Anesthesia: General | Site: Esophagus

## 2016-12-03 MED ORDER — GLUCAGON HCL RDNA (DIAGNOSTIC) 1 MG IJ SOLR
INTRAMUSCULAR | Status: AC
  Filled 2016-12-03: qty 1

## 2016-12-03 MED ORDER — PHENYLEPHRINE HCL 10 MG/ML IJ SOLN
INTRAMUSCULAR | Status: DC | PRN
  Administered 2016-12-03 (×3): 40 ug via INTRAVENOUS

## 2016-12-03 MED ORDER — EPHEDRINE SULFATE 50 MG/ML IJ SOLN
INTRAMUSCULAR | Status: DC | PRN
  Administered 2016-12-03 (×2): 10 mg via INTRAVENOUS

## 2016-12-03 MED ORDER — FENTANYL CITRATE (PF) 100 MCG/2ML IJ SOLN
INTRAMUSCULAR | Status: DC | PRN
  Administered 2016-12-03 (×2): 50 ug via INTRAVENOUS

## 2016-12-03 MED ORDER — CEFAZOLIN SODIUM-DEXTROSE 2-3 GM-% IV SOLR
INTRAVENOUS | Status: DC | PRN
  Administered 2016-12-03: 2 g via INTRAVENOUS

## 2016-12-03 MED ORDER — LIDOCAINE HCL (CARDIAC) 10 MG/ML IV SOLN
INTRAVENOUS | Status: DC | PRN
  Administered 2016-12-03: 40 mg via INTRAVENOUS

## 2016-12-03 MED ORDER — SIMETHICONE 40 MG/0.6ML PO SUSP
ORAL | Status: DC | PRN
  Administered 2016-12-03: 1000 mL

## 2016-12-03 MED ORDER — SUCCINYLCHOLINE 20MG/ML (10ML) SYRINGE FOR MEDFUSION PUMP - OPTIME
INTRAMUSCULAR | Status: DC | PRN
  Administered 2016-12-03: 120 mg via INTRAVENOUS

## 2016-12-03 MED ORDER — PROPOFOL 10 MG/ML IV BOLUS
INTRAVENOUS | Status: DC | PRN
  Administered 2016-12-03: 160 mg via INTRAVENOUS

## 2016-12-03 MED ORDER — CHLORHEXIDINE GLUCONATE CLOTH 2 % EX PADS: 6.0000 | MEDICATED_PAD | Freq: Once | CUTANEOUS | Status: DC

## 2016-12-03 MED ORDER — MIDAZOLAM HCL 2 MG/2ML IJ SOLN
INTRAMUSCULAR | Status: AC
  Filled 2016-12-03: qty 2

## 2016-12-03 MED ORDER — LIDOCAINE VISCOUS 2 % MT SOLN
OROMUCOSAL | Status: AC
  Filled 2016-12-03: qty 15

## 2016-12-03 MED ORDER — IOPAMIDOL (ISOVUE-M 300) INJECTION 61%
INTRAMUSCULAR | Status: DC | PRN
  Administered 2016-12-03 (×2): 15 mL via INTRATHECAL

## 2016-12-03 MED ORDER — GLYCOPYRROLATE 0.2 MG/ML IJ SOLN
INTRAMUSCULAR | Status: AC
  Filled 2016-12-03: qty 1

## 2016-12-03 MED ORDER — FENTANYL CITRATE (PF) 250 MCG/5ML IJ SOLN
INTRAMUSCULAR | Status: AC
  Filled 2016-12-03: qty 5

## 2016-12-03 MED ORDER — ROCURONIUM 10MG/ML (10ML) SYRINGE FOR MEDFUSION PUMP - OPTIME
INTRAVENOUS | Status: DC | PRN
  Administered 2016-12-03: 10 mg via INTRAVENOUS
  Administered 2016-12-03: 7 mg via INTRAVENOUS

## 2016-12-03 MED ORDER — IOPAMIDOL (ISOVUE-300) INJECTION 61%
INTRAVENOUS | Status: AC
  Filled 2016-12-03: qty 100

## 2016-12-03 MED ORDER — LACTATED RINGERS IV SOLN
INTRAVENOUS | Status: DC
  Administered 2016-12-03: 08:00:00 via INTRAVENOUS

## 2016-12-03 MED ORDER — CEFAZOLIN SODIUM-DEXTROSE 2-4 GM/100ML-% IV SOLN
INTRAVENOUS | Status: AC
  Filled 2016-12-03: qty 100

## 2016-12-03 MED ORDER — GLYCOPYRROLATE 0.2 MG/ML IJ SOLN
0.2000 mg | Freq: Once | INTRAMUSCULAR | Status: AC
  Administered 2016-12-03: 0.2 mg via INTRAVENOUS

## 2016-12-03 MED ORDER — PROPOFOL 10 MG/ML IV BOLUS
INTRAVENOUS | Status: AC
  Filled 2016-12-03: qty 20

## 2016-12-03 MED ORDER — LIDOCAINE VISCOUS 2 % MT SOLN
6.0000 mL | Freq: Once | OROMUCOSAL | Status: AC
  Administered 2016-12-03: 6 mL via OROMUCOSAL

## 2016-12-03 MED ORDER — FENTANYL CITRATE (PF) 100 MCG/2ML IJ SOLN: 25.0000 ug | INTRAMUSCULAR | Status: DC | PRN

## 2016-12-03 MED ORDER — CEFAZOLIN SODIUM-DEXTROSE 2-4 GM/100ML-% IV SOLN: 2.0000 g | INTRAVENOUS | Status: DC

## 2016-12-03 MED ORDER — MIDAZOLAM HCL 2 MG/2ML IJ SOLN
1.0000 mg | INTRAMUSCULAR | Status: AC
  Administered 2016-12-03: 2 mg via INTRAVENOUS

## 2016-12-03 MED ORDER — GLUCAGON HCL RDNA (DIAGNOSTIC) 1 MG IJ SOLR
INTRAMUSCULAR | Status: DC | PRN
  Administered 2016-12-03: .25 mg via INTRAVENOUS

## 2016-12-03 MED ORDER — ESOMEPRAZOLE MAGNESIUM 20 MG PO CPDR: 20.0000 mg | DELAYED_RELEASE_CAPSULE | Freq: Every day | ORAL | Status: DC

## 2016-12-03 NOTE — Transfer of Care (Signed)
Immediate Anesthesia Transfer of Care Note  Patient: Robert Walters  Procedure(s) Performed: Procedure(s): ENDOSCOPIC RETROGRADE CHOLANGIOPANCREATOGRAPHY (ERCP) (N/A) BILIARY STENT REMOVAL (N/A)  Patient Location: PACU  Anesthesia Type:General  Level of Consciousness: awake and alert   Airway & Oxygen Therapy: Patient Spontanous Breathing  Post-op Assessment: Report given to RN  Post vital signs: Reviewed  Last Vitals:  Vitals:   12/03/16 0641 12/03/16 0730  BP:  (!) 144/89  Resp: 16 (!) 0  Temp: 36.6 C     Last Pain:  Vitals:   12/03/16 0641  TempSrc: Oral  PainSc: 2       Patients Stated Pain Goal: 6 (10/24/74 2831)  Complications: No apparent anesthesia complications

## 2016-12-03 NOTE — Anesthesia Postprocedure Evaluation (Signed)
Anesthesia Post Note  Patient: Robert Walters  Procedure(s) Performed: Procedure(s) (LRB): ENDOSCOPIC RETROGRADE CHOLANGIOPANCREATOGRAPHY (ERCP) (N/A) BILIARY STENT REMOVAL (N/A)  Patient location during evaluation: Short Stay Anesthesia Type: General Level of consciousness: awake and alert Pain management: satisfactory to patient Vital Signs Assessment: post-procedure vital signs reviewed and stable Respiratory status: spontaneous breathing Cardiovascular status: stable Postop Assessment: no signs of nausea or vomiting Anesthetic complications: no     Last Vitals:  Vitals:   12/03/16 0915 12/03/16 0930  BP: 116/76 126/76  Pulse: 80 86  Resp: 18 15  Temp:      Last Pain:  Vitals:   12/03/16 0930  TempSrc:   PainSc: 0-No pain                 Rueben Kassim

## 2016-12-03 NOTE — Op Note (Addendum)
[facility] Patient Name: Robert Walters Procedure Date: 12/03/2016 7:21 AM MRN: [id number] Date of Birth: January 09, 1956 Attending MD: Hildred Laser , MD CSN: [id number] Age: 61 Admit Type: Outpatient Procedure:                ERCP Indications:              Biliary stent removal. history of biliary leaks                            treated with stenting. Providers:                Hildred Laser, MD, Jeanann Lewandowsky. Sharon Seller, RN, Randa Spike, Technician Referring MD:             [name], MD Medicines:                General Anesthesia Complications:            No immediate complications. Estimated Blood Loss:     None Procedure:                Pre-Anesthesia Assessment:                           - Prior to the procedure, a History and Physical                            was performed, and patient medications and                            allergies were reviewed. The patient's tolerance of                            previous anesthesia was also reviewed. The risks                            and benefits of the procedure and the sedation                            options and risks were discussed with the patient.                            All questions were answered, and informed consent                            was obtained. Prior Anticoagulants: The patient                            last took aspirin 2 days prior to the procedure.                            ASA Grade Assessment: III - A patient with severe  systemic disease. After reviewing the risks and                            benefits, the patient was deemed in satisfactory                            condition to undergo the procedure.                           After obtaining informed consent, the scope was                            passed under direct vision. Throughout the                            procedure, the patient's blood pressure, pulse, and            oxygen saturations were monitored continuously. The                            [id number] ([id number]) scope was introduced through                            the mouth, and used to inject contrast into and                            used to inject contrast into the bile duct. The                            ERCP was accomplished without difficulty. The                            patient tolerated the procedure well. Scope In: 8:09:31 AM Scope Out: 8:35:31 AM Total Procedure Duration: 0 hours 26 minutes 0 seconds  Findings:      A biliary stent was visible on the scout film. The esophagus was       successfully intubated under direct vision. The scope was advanced to a       normal major papilla in the descending duodenum without detailed       examination of the pharynx, larynx and associated structures, and upper       GI tract. The upper GI tract was grossly normal. One stent was removed       from the biliary tree using a snare. The stent was found to be partially       occluded via the water column test. The bile duct was deeply cannulated       with the Autotome sphincterotome. Contrast was injected. I personally       interpreted the bile duct images. There was brisk flow of contrast       through the ducts. Image quality was excellent. Contrast extended to the       entire biliary tree. The main bile duct was mildly dilated, acquired.       The largest diameter was 10 mm. To discover objects, the biliary tree       was swept with a 10 mm balloon starting at  the upper third of the main       bile duct. Nothing was found. Impression:               - The entire main bile duct was mildly dilated                            without extravastion or choledocholithiasis.                           - One stent was removed from the biliary tree.                           - The biliary tree was swept and nothing was found.                           - small amount of food debris noted in  the stomach. Moderate Sedation:      Per Anesthesia Care Recommendation:           - Discharge patient to home (ambulatory).                           - Avoid aspirin and nonsteroidal anti-inflammatory                            medicines today.                           - Continue present medications.                           - Nexium 20 mg OTC by mouth 30 units before evening                            meal daily.                           - Procedure Code(s):        --- Professional ---                           [phone], Endoscopic retrograde                            cholangiopancreatography (ERCP); with removal of                            foreign body(s) or stent(s) from biliary/pancreatic                            duct(s) Diagnosis Code(s):        --- Professional ---                           X73.53, Encounter for fitting and adjustment of                            other gastrointestinal appliance  and device                           K83.8, Other specified diseases of biliary tract CPT copyright 2016 American Medical Association. All rights reserved. The codes documented in this report are preliminary and upon coder review may  be revised to meet current compliance requirements. Hildred Laser, MD Hildred Laser, MD 12/03/2016 8:52:28 AM This report has been signed electronically. Number of Addenda: 0

## 2016-12-03 NOTE — Progress Notes (Signed)
Refill ERCP note:  Small amount of food debris in the stomach. Biliary stent in place. Stent was removed with polypectomy snare and if fluoroscopic control. Stent was nearly completely occluded. Mildly dilated biliary system. No evidence of biliary leak.

## 2016-12-03 NOTE — Anesthesia Preprocedure Evaluation (Signed)
Anesthesia Evaluation  Patient identified by MRN, date of birth, ID band Patient awake    Reviewed: Allergy & Precautions, NPO status , Patient's Chart, lab work & pertinent test results  Airway Mallampati: III  TM Distance: <3 FB   Mouth opening: Limited Mouth Opening  Dental  (+) Teeth Intact   Pulmonary neg pulmonary ROS,    breath sounds clear to auscultation       Cardiovascular hypertension, Pt. on medications + Peripheral Vascular Disease   Rhythm:Regular Rate:Normal     Neuro/Psych PSYCHIATRIC DISORDERS Bipolar Disorder TIACVA, Residual Symptoms    GI/Hepatic hiatal hernia, GERD  ,  Endo/Other  diabetes, Type 2, Oral Hypoglycemic Agents  Renal/GU      Musculoskeletal   Abdominal   Peds  Hematology   Anesthesia Other Findings   Reproductive/Obstetrics                             Anesthesia Physical Anesthesia Plan  ASA: III  Anesthesia Plan: General   Post-op Pain Management:    Induction: Intravenous, Rapid sequence and Cricoid pressure planned  PONV Risk Score and Plan:   Airway Management Planned: Oral ETT and Video Laryngoscope Planned  Additional Equipment:   Intra-op Plan:   Post-operative Plan: Extubation in OR  Informed Consent: I have reviewed the patients History and Physical, chart, labs and discussed the procedure including the risks, benefits and alternatives for the proposed anesthesia with the patient or authorized representative who has indicated his/her understanding and acceptance.     Plan Discussed with:   Anesthesia Plan Comments:         Anesthesia Quick Evaluation

## 2016-12-03 NOTE — Anesthesia Procedure Notes (Signed)
Procedure Name: Intubation Performed by: Ollen Bowl Pre-anesthesia Checklist: Patient identified, Patient being monitored, Timeout performed, Emergency Drugs available and Suction available Patient Re-evaluated:Patient Re-evaluated prior to induction Oxygen Delivery Method: Circle system utilized Preoxygenation: Pre-oxygenation with 100% oxygen Induction Type: IV induction, Rapid sequence and Cricoid Pressure applied Ventilation: Mask ventilation without difficulty Laryngoscope Size: Glidescope and 3 Grade View: Grade I Tube type: Oral Tube size: 7.0 mm Number of attempts: 1 Airway Equipment and Method: Stylet Placement Confirmation: ETT inserted through vocal cords under direct vision,  positive ETCO2 and breath sounds checked- equal and bilateral Secured at: 21 cm Tube secured with: Tape Dental Injury: Teeth and Oropharynx as per pre-operative assessment

## 2016-12-03 NOTE — H&P (Signed)
Robert Walters is an 61 y.o. male.   Chief Complaint: Patient is here for ERCP and stent removal. HPI: Patient is 61 year old Caucasian male who underwent cholecystectomy on 08/25/2016. He was found to have empyema of the gallbladder with gangrene. Biliary drain was placed. He continued to leak bile into the drain. 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 He therefore underwent ERCP on 09/17/2016 revealing cystic duct remnant leak. Small sphincterotomy was performed and plastic biliary stent was placed. He began to feel better. JB drain was removed few days later. He denies abdominal pain fever or chills. He complains of heartburn usually at night. He says he took pantoprazole but it did not help. He denies melena or rectal bleeding.  Past Medical History:  Diagnosis Date  . Acid reflux disease   . Bipolar 1 disorder, mixed ([place])   . BPH (benign prostatic hypertrophy)   . CVA (cerebral vascular accident) ([place]) 07/2015  . Gastritis   . H/O hiatal hernia   . Hyperlipidemia   . Hypertension   . Normal nuclear stress test Dec 2011  . Obesity   . TIA (transient ischemic attack) 05/15/2012   "they think I've had one this am @ 0130" (05/15/2012)  . Type II diabetes mellitus ([place])   . Vertigo     Past Surgical History:  Procedure Laterality Date  . BILIARY STENT PLACEMENT N/A 09/17/2016   Procedure: BILIARY STENT PLACEMENT;  Surgeon: [name], MD;  Location: [facility];  Service: Endoscopy;  Laterality: N/A;  . CARDIOVASCULAR STRESS TEST  2009   EF 63%  . CHOLECYSTECTOMY N/A 08/25/2016   Procedure: LAPAROSCOPIC CHOLECYSTECTOMY;  Surgeon: [name], MD;  Location: [facility] ORS;  Service: General;  Laterality: N/A;  . COLONOSCOPY    . ERCP N/A 09/17/2016   Procedure: ENDOSCOPIC RETROGRADE CHOLANGIOPANCREATOGRAPHY (ERCP);  Surgeon: [name], MD;  Location: [facility];  Service: Endoscopy;  Laterality: N/A;  730  . ESOPHAGOGASTRODUODENOSCOPY    . NO PAST SURGERIES    . SPHINCTEROTOMY  N/A 09/17/2016   Procedure: SPHINCTEROTOMY;  Surgeon: [name], MD;  Location: [facility];  Service: Endoscopy;  Laterality: N/A;    Family History  Problem Relation Age of Onset  . Hyperlipidemia Father        in a nursing home  . Colon cancer Father   . Stroke Mother 61   Social History:  reports that he has never smoked. He has never used smokeless tobacco. He reports that he does not drink alcohol or use drugs.  Allergies: No Known Allergies  Medications Prior to Admission  Medication Sig Dispense Refill  . aspirin 325 MG tablet Take 1 tablet (325 mg total) by mouth daily. 30 tablet 1  . atorvastatin (LIPITOR) 40 MG tablet Take 1 tablet (40 mg total) by mouth daily at 6 PM. 30 tablet 1  . b complex vitamins tablet Take 1 tablet by mouth daily.    . Cholecalciferol (VITAMIN D) 2000 UNITS CAPS Take 1 capsule by mouth 3 (three) times daily.     [name] CINNAMON PO Take 1 tablet by mouth daily.    . Coenzyme Q10 (COQ10 PO) Take 1 tablet by mouth daily.    [name] FLUoxetine (PROZAC) 40 MG capsule Take 40 mg by mouth daily.    [name] glipiZIDE (GLUCOTROL) 10 MG tablet Take 10 mg by mouth daily.     [name] lamoTRIgine (LAMICTAL) 200 MG tablet Take 200 mg by mouth at bedtime.     [name]  LORazepam (ATIVAN) 1 MG tablet Take 1 mg by mouth 2 (two) times daily.     [name] losartan (COZAAR) 100 MG tablet Take 1 tablet (100 mg total) by mouth daily. 90 tablet 3  . metFORMIN (GLUCOPHAGE) 1000 MG tablet Take 1,000 mg by mouth 2 (two) times daily with a meal.      . metoprolol tartrate (LOPRESSOR) 25 MG tablet Take 25 mg by mouth 2 (two) times daily.    . Multiple Vitamin (MULTIVITAMIN WITH MINERALS) TABS tablet Take 1 tablet by mouth daily.    . Tamsulosin HCl (FLOMAX) 0.4 MG CAPS Take 0.4 mg by mouth 2 (two) times daily.    [name] zolpidem (AMBIEN) 5 MG tablet Take 5 mg by mouth at bedtime as needed for sleep.    . metoprolol tartrate (LOPRESSOR) 25 MG tablet Take 1 tablet (25 mg total) by mouth 2 (two) times daily. 60  tablet 11    No results found for this or any previous visit (from the past 48 hour(s)). No results found.  ROS  Temperature 97.8 F (36.6 C), temperature source Oral, resp. rate 16, height 5\' 9"  (1.753 m), weight 211 lb (95.7 kg). Physical Exam  Constitutional: He appears well-developed and well-nourished.  HENT:  Mouth/Throat: Oropharynx is clear and moist.  Eyes: Conjunctivae are normal. No scleral icterus.  Neck: No thyromegaly present.  Cardiovascular: Normal rate, regular rhythm and normal heart sounds.   No murmur heard. Respiratory: Effort normal and breath sounds normal.  GI:  Abdomen is full with well-healed laparoscopy scars. Abdomen is soft and nontender without organomegaly or masses.  Musculoskeletal: He exhibits no edema.  Lymphadenopathy:    He has no cervical adenopathy.  Neurological: He is alert.  Skin: Skin is warm and dry.     Assessment/Plan History of biliary leak on cystic duct remnant following cholecystectomy treated with biliary stent. ERCP with stent removal.  Hildred Laser, MD 12/03/2016, 7:22 AM

## 2016-12-03 NOTE — Discharge Instructions (Signed)
Resume aspirin on 12/04/2016. Resume other medications as before. Nexium OTC 20 mg by mouth 30 minutes before evening meal daily. Clear liquids today. Usual diet starting tomorrow morning. Call office in 2-3 weeks if heartburn unresponsive to therapy.   Endoscopic Retrograde Cholangiopancreatogram, Care After This sheet gives you information about how to care for yourself after your procedure. Your health care provider may also give you more specific instructions. If you have problems or questions, contact your health care provider. What can I expect after the procedure? After the procedure, it is common to have:  Soreness in your throat.  Nausea.  Bloating.  Dizziness.  Tiredness (fatigue).  Follow these instructions at home:  Take over-the-counter and prescription medicines only as told by your health care provider.  Do not drive for 24 hours if you were given a medicine to help you relax (sedative) during your procedure. Have someone stay with you for 24 hours after the procedure.  Return to your normal activities as told by your health care provider. Ask your health care provider what activities are safe for you.  Return to eating what you normally do as soon as you feel well enough or as told by your health care provider.  Keep all follow-up visits as told by your health care provider. This is important. Contact a health care provider if:  You have pain in your abdomen that does not get better with medicine.  You develop signs of infection, such as: ? Chills. ? Feeling unwell. Get help right away if:  You have difficulty swallowing.  You have worsening pain in your throat, chest, or abdomen.  You vomit bright red blood or a substance that looks like coffee grounds.  You have bloody or very black stools.  You have a fever.  You have a sudden increase in swelling (bloating) in your abdomen. Summary  After the procedure, it is common to feel tired and to have  some discomfort in your throat.  Contact your health care provider if you have signs of infection--such as chills or feeling unwell--or if you have pain that does not improve with medicine.  Get help right away if you have trouble swallowing, worsening pain, bloody or black vomit, bloody or black stools, a fever, or increased swelling in your abdomen.  Keep all follow-up visits as told by your health care provider. This is important. This information is not intended to replace advice given to you by your health care provider. Make sure you discuss any questions you have with your health care provider. Document Released: 02/07/2013 Document Revised: 03/08/2016 Document Reviewed: 03/08/2016 Elsevier Interactive Patient Education  2017 [employer].

## 2016-12-05 DIAGNOSIS — E1165 Type 2 diabetes mellitus with hyperglycemia: Secondary | ICD-10-CM | POA: Diagnosis not present

## 2016-12-07 ENCOUNTER — Encounter (HOSPITAL_COMMUNITY): Payer: Self-pay | Admitting: Internal Medicine

## 2016-12-13 DIAGNOSIS — I739 Peripheral vascular disease, unspecified: Secondary | ICD-10-CM | POA: Diagnosis not present

## 2016-12-16 DIAGNOSIS — E1165 Type 2 diabetes mellitus with hyperglycemia: Secondary | ICD-10-CM | POA: Diagnosis not present

## 2016-12-16 DIAGNOSIS — E782 Mixed hyperlipidemia: Secondary | ICD-10-CM | POA: Diagnosis not present

## 2016-12-16 DIAGNOSIS — N521 Erectile dysfunction due to diseases classified elsewhere: Secondary | ICD-10-CM | POA: Diagnosis not present

## 2016-12-16 DIAGNOSIS — I10 Essential (primary) hypertension: Secondary | ICD-10-CM | POA: Diagnosis not present

## 2016-12-16 DIAGNOSIS — Z6832 Body mass index (BMI) 32.0-32.9, adult: Secondary | ICD-10-CM | POA: Diagnosis not present

## 2016-12-16 DIAGNOSIS — Z0000 Encounter for general adult medical examination without abnormal findings: Secondary | ICD-10-CM | POA: Diagnosis not present

## 2016-12-16 DIAGNOSIS — E559 Vitamin D deficiency, unspecified: Secondary | ICD-10-CM | POA: Diagnosis not present

## 2017-01-23 DIAGNOSIS — E1165 Type 2 diabetes mellitus with hyperglycemia: Secondary | ICD-10-CM | POA: Diagnosis not present

## 2017-02-28 DIAGNOSIS — I739 Peripheral vascular disease, unspecified: Secondary | ICD-10-CM | POA: Diagnosis not present

## 2017-03-14 DIAGNOSIS — E1165 Type 2 diabetes mellitus with hyperglycemia: Secondary | ICD-10-CM | POA: Diagnosis not present

## 2017-03-16 DIAGNOSIS — H5203 Hypermetropia, bilateral: Secondary | ICD-10-CM | POA: Diagnosis not present

## 2017-03-16 DIAGNOSIS — E119 Type 2 diabetes mellitus without complications: Secondary | ICD-10-CM | POA: Diagnosis not present

## 2017-03-16 DIAGNOSIS — H2513 Age-related nuclear cataract, bilateral: Secondary | ICD-10-CM | POA: Diagnosis not present

## 2017-03-16 DIAGNOSIS — Z7984 Long term (current) use of oral hypoglycemic drugs: Secondary | ICD-10-CM | POA: Diagnosis not present

## 2017-03-31 DIAGNOSIS — D225 Melanocytic nevi of trunk: Secondary | ICD-10-CM | POA: Diagnosis not present

## 2017-03-31 DIAGNOSIS — L570 Actinic keratosis: Secondary | ICD-10-CM | POA: Diagnosis not present

## 2017-03-31 DIAGNOSIS — L821 Other seborrheic keratosis: Secondary | ICD-10-CM | POA: Diagnosis not present

## 2017-04-18 ENCOUNTER — Other Ambulatory Visit: Payer: Self-pay

## 2017-04-18 DIAGNOSIS — E782 Mixed hyperlipidemia: Secondary | ICD-10-CM | POA: Diagnosis not present

## 2017-04-18 DIAGNOSIS — I10 Essential (primary) hypertension: Secondary | ICD-10-CM | POA: Diagnosis not present

## 2017-04-18 DIAGNOSIS — E559 Vitamin D deficiency, unspecified: Secondary | ICD-10-CM | POA: Diagnosis not present

## 2017-04-18 DIAGNOSIS — E1165 Type 2 diabetes mellitus with hyperglycemia: Secondary | ICD-10-CM | POA: Diagnosis not present

## 2017-04-18 MED ORDER — METOPROLOL TARTRATE 25 MG PO TABS: 25.0000 mg | ORAL_TABLET | Freq: Two times a day (BID) | ORAL | 0 refills | Status: DC

## 2017-04-22 ENCOUNTER — Other Ambulatory Visit: Payer: Self-pay | Admitting: *Deleted

## 2017-04-22 ENCOUNTER — Telehealth: Payer: Self-pay | Admitting: Cardiovascular Disease

## 2017-04-22 MED ORDER — LOSARTAN POTASSIUM 100 MG PO TABS: ORAL_TABLET | ORAL | 0 refills | Status: DC

## 2017-04-22 NOTE — Telephone Encounter (Signed)
New Message    *STAT* If patient is at the pharmacy, call can be transferred to refill team.   1. Which medications need to be refilled? (please list name of each medication and dose if known) Losartan 100mg   2. Which pharmacy/location (including street and city if local pharmacy) is medication to be sent to? [facility]   3. Do they need a 30 day or 90 day supply? [place]

## 2017-05-11 DIAGNOSIS — E1165 Type 2 diabetes mellitus with hyperglycemia: Secondary | ICD-10-CM | POA: Diagnosis not present

## 2017-05-19 ENCOUNTER — Encounter: Payer: Self-pay | Admitting: Cardiovascular Disease

## 2017-05-19 ENCOUNTER — Ambulatory Visit: Payer: Medicare Other | Admitting: Cardiovascular Disease

## 2017-05-19 ENCOUNTER — Telehealth: Payer: Self-pay | Admitting: Cardiovascular Disease

## 2017-05-19 VITALS — BP 122/78 | HR 80 | Ht 69.0 in | Wt 231.0 lb

## 2017-05-19 DIAGNOSIS — I10 Essential (primary) hypertension: Secondary | ICD-10-CM

## 2017-05-19 DIAGNOSIS — E782 Mixed hyperlipidemia: Secondary | ICD-10-CM | POA: Diagnosis not present

## 2017-05-19 DIAGNOSIS — I5032 Chronic diastolic (congestive) heart failure: Secondary | ICD-10-CM | POA: Diagnosis not present

## 2017-05-19 LAB — LIPID PANEL
CHOL/HDL RATIO: 3.7 ratio (ref 0.0–5.0)
CHOLESTEROL TOTAL: 176 mg/dL (ref 100–199)
HDL: 47 mg/dL (ref >39)
LDL Calculated: 93 mg/dL (ref 0–99)
TRIGLYCERIDES: 181 mg/dL — AB (ref 0–149)
VLDL Cholesterol Cal: 36 mg/dL (ref 5–40)

## 2017-05-19 LAB — BASIC METABOLIC PANEL
BUN/Creatinine Ratio: 13 (ref 10–24)
BUN: 18 mg/dL (ref 8–27)
CALCIUM: 9.3 mg/dL (ref 8.6–10.2)
CHLORIDE: 107 mmol/L — AB (ref 96–106)
CO2: 18 mmol/L — ABNORMAL LOW (ref 20–29)
Creatinine, Ser: 1.34 mg/dL — ABNORMAL HIGH (ref 0.76–1.27)
GFR, EST AFRICAN AMERICAN: 65 mL/min/{1.73_m2} (ref >59)
GFR, EST NON AFRICAN AMERICAN: 56 mL/min/{1.73_m2} — AB (ref >59)
Glucose: 162 mg/dL — ABNORMAL HIGH (ref 65–99)
Potassium: 4.6 mmol/L (ref 3.5–5.2)
Sodium: 143 mmol/L (ref 134–144)

## 2017-05-19 LAB — HEPATIC FUNCTION PANEL
ALT: 29 IU/L (ref 0–44)
AST: 22 IU/L (ref 0–40)
Albumin: 4.4 g/dL (ref 3.6–4.8)
Alkaline Phosphatase: 136 IU/L — ABNORMAL HIGH (ref 39–117)
BILIRUBIN TOTAL: 0.5 mg/dL (ref 0.0–1.2)
BILIRUBIN, DIRECT: 0.14 mg/dL (ref 0.00–0.40)
Total Protein: 6.8 g/dL (ref 6.0–8.5)

## 2017-05-19 MED ORDER — ASPIRIN EC 81 MG PO TBEC: 81.0000 mg | DELAYED_RELEASE_TABLET | Freq: Every day | ORAL | Status: AC

## 2017-05-19 NOTE — Patient Instructions (Addendum)
Medication Instructions:  Your physician has recommended you make the following change in your medication:  DECREASE Aspirin to 81 mg once daily   Labwork: TODAY - basic metabolic panel, liver panel, cholesterol    Testing/Procedures: None Ordered   Follow-Up: Your physician wants you to follow-up in: 1 year with [name] or a PA or Nurse Practitioner on his team.  You will receive a reminder letter in the mail two months in advance. If you don't receive a letter, please call our office to schedule the follow-up appointment.   If you need a refill on your cardiac medications before your next appointment, please call your pharmacy.   Thank you for choosing [facility]! Christen Bame, RN [phone]

## 2017-05-19 NOTE — Telephone Encounter (Signed)
Robert Walters is calling because he has a question about his After Visit Summary . Please call   Thanks

## 2017-05-19 NOTE — Progress Notes (Signed)
Cardiology Office Note   Date:  05/19/2017   ID:  Robert Walters, DOB 15-Dec-1955, MRN [id number]  PCP:  Glendale Chard, MD  Cardiologist:   Mertie Moores, MD   Chief Complaint  Patient presents with  . Hypertension  . Hyperlipidemia   Problem List: 1. Hypertension 2. Hyperlipidemia 3.  Chronic diastolic congestive heart failure 4. Obesity 5. GERD 6.   Diabetes mellitus - with gastroparesis  Feb. 9, 2016:  Robert Walters is a 62 y.o. male who presents for follow up of his HTN . He also has a history of hyperlipidemia that is controlled and monitored by his medical doctor.  He does not eat restricted diet. He does not get any regular exercise. He's had some issues with depression and spends a lot of time in bed.  Sept. 21, 2017:  Robert Walters is seen back today for Further eval Had a stroke in March, was admitted to [facility] Echo showed normal LV function .  Mild MR  Still eats lots of salt .   Does not exercise.   May 19, 2017:  Robert Walters is seen today for follow-up visit.  He has a history of chronic diastolic congestive heart failure. Had gall bladder surgery this past year.  Breathing is ok.   Still eats salt.    Exercises only a little .   Is depressed.  Eats popcorn regularly    Past Medical History:  Diagnosis Date  . Acid reflux disease   . Bipolar 1 disorder, mixed ([place])   . BPH (benign prostatic hypertrophy)   . CVA (cerebral vascular accident) ([place]) 07/2015  . Gastritis   . H/O hiatal hernia   . Hyperlipidemia   . Hypertension   . Normal nuclear stress test Dec 2011  . Obesity   . TIA (transient ischemic attack) 05/15/2012   "they think I've had one this am @ 0130" (05/15/2012)  . Type II diabetes mellitus ([place])   . Vertigo     Past Surgical History:  Procedure Laterality Date  . BILIARY STENT PLACEMENT N/A 09/17/2016   Procedure: BILIARY STENT PLACEMENT;  Surgeon: [name], MD;  Location: [facility];  Service: Endoscopy;   Laterality: N/A;  . CARDIOVASCULAR STRESS TEST  2009   EF 63%  . CHOLECYSTECTOMY N/A 08/25/2016   Procedure: LAPAROSCOPIC CHOLECYSTECTOMY;  Surgeon: [name], MD;  Location: [facility] ORS;  Service: General;  Laterality: N/A;  . COLONOSCOPY    . ERCP N/A 09/17/2016   Procedure: ENDOSCOPIC RETROGRADE CHOLANGIOPANCREATOGRAPHY (ERCP);  Surgeon: [name], MD;  Location: [facility];  Service: Endoscopy;  Laterality: N/A;  730  . ERCP N/A 12/03/2016   Procedure: ENDOSCOPIC RETROGRADE CHOLANGIOPANCREATOGRAPHY (ERCP);  Surgeon: [name], MD;  Location: [facility];  Service: Gastroenterology;  Laterality: N/A;  . ESOPHAGOGASTRODUODENOSCOPY    . GASTROINTESTINAL STENT REMOVAL N/A 12/03/2016   Procedure: BILIARY STENT REMOVAL;  Surgeon: [name], MD;  Location: [facility];  Service: Gastroenterology;  Laterality: N/A;  . NO PAST SURGERIES    . SPHINCTEROTOMY N/A 09/17/2016   Procedure: SPHINCTEROTOMY;  Surgeon: [name], MD;  Location: [facility];  Service: Endoscopy;  Laterality: N/A;     Current Outpatient Medications  Medication Sig Dispense Refill  . aspirin 325 MG tablet Take 1 tablet (325 mg total) by mouth daily. 30 tablet 1  . atorvastatin (LIPITOR) 40 MG tablet Take 1 tablet (40 mg total) by mouth daily at 6 PM. 30  tablet 1  . b complex vitamins tablet Take 1 tablet by mouth daily.    . Cholecalciferol (VITAMIN D) 2000 UNITS CAPS Take 1 capsule by mouth 3 (three) times daily.     [name] CINNAMON PO Take 1 tablet by mouth daily.    . Coenzyme Q10 (COQ10 PO) Take 1 tablet by mouth daily.    [name] FLUoxetine (PROZAC) 40 MG capsule Take 40 mg by mouth daily.    [name] glipiZIDE (GLUCOTROL) 10 MG tablet Take 10 mg by mouth daily.     [name] lamoTRIgine (LAMICTAL) 200 MG tablet Take 200 mg by mouth at bedtime.     [name] LORazepam (ATIVAN) 1 MG tablet Take 1 mg by mouth 2 (two) times daily.     [name] losartan (COZAAR) 100 MG tablet Take 1 tablet by mouth daily. Patient is overdue for an  appointment and needs to call and schedule for further refills 1st attempt 30 tablet 0  . metFORMIN (GLUCOPHAGE) 1000 MG tablet Take 1,000 mg by mouth 2 (two) times daily with a meal.      . Multiple Vitamin (MULTIVITAMIN WITH MINERALS) TABS tablet Take 1 tablet by mouth daily.    . Tamsulosin HCl (FLOMAX) 0.4 MG CAPS Take 0.4 mg by mouth 2 (two) times daily.    [name] zolpidem (AMBIEN) 5 MG tablet Take 5 mg by mouth at bedtime as needed for sleep.    . metoprolol tartrate (LOPRESSOR) 25 MG tablet Take 1 tablet (25 mg total) by mouth 2 (two) times daily. 60 tablet 11   No current facility-administered medications for this visit.     Allergies:   Patient has no known allergies.    Social History:  The patient  reports that  has never smoked. he has never used smokeless tobacco. He reports that he does not drink alcohol or use drugs.   Family History:  The patient's family history includes Colon cancer in his father; Hyperlipidemia in his father; Stroke (age of onset: 39) in his mother.    ROS:    Physical Exam: Blood pressure 122/78, pulse 80, height 5\' 9"  (1.753 m), weight 231 lb (104.8 kg), SpO2 98 %.  GEN:  Well nourished, well developed in no acute distress HEENT: Normal NECK: No JVD; No carotid bruits LYMPHATICS: No lymphadenopathy CARDIAC: RR, no murmurs, rubs, gallops RESPIRATORY:  Clear to auscultation without rales, wheezing or rhonchi  ABDOMEN: Soft, non-tender, non-distended MUSCULOSKELETAL:  No edema; No deformity  SKIN: Warm and dry NEUROLOGIC:  Alert and oriented x 3    EKG:     Recent Labs: 08/26/2016: Magnesium 1.8 09/01/2016: Hemoglobin 12.1; Platelets 315 10/19/2016: ALT 23; BUN 24; Creat 1.33; Potassium 4.8; Sodium 138    Lipid Panel    Component Value Date/Time   CHOL 249 (H) 07/26/2015 0647   TRIG 817 (H) 07/26/2015 0647   HDL 35 (L) 07/26/2015 0647   CHOLHDL 7.1 07/26/2015 0647   VLDL UNABLE TO CALCULATE IF TRIGLYCERIDE OVER 400 mg/dL 07/26/2015 0647     LDLCALC UNABLE TO CALCULATE IF TRIGLYCERIDE OVER 400 mg/dL 07/26/2015 0647   LDLDIRECT 124.3 11/10/2010 0953      Wt Readings from Last 3 Encounters:  05/19/17 231 lb (104.8 kg)  12/03/16 211 lb (95.7 kg)  10/19/16 211 lb 4.8 oz (95.8 kg)      Other studies Reviewed: Additional studies/ records that were reviewed today include:. Review of the above records demonstrates:    ASSESSMENT AND PLAN:  Problem List: 1. Hypertension -  Refill meds,   BP seems  To be well controlled.  He eats a high salt diet   2. Hyperlipidemia - managed by his primary MD ,  His lipids sound elevated.   Will double check today   3. Diabetes mellitus -   Managed by his primary   5. Obesity -    6. CVA -     Current medicines are reviewed at length with the patient today.  The patient does not have concerns regarding medicines.  The following changes have been made:  no change   Disposition:   FU with APP  in 1 year     Signed, Mertie Moores, MD  05/19/2017 9:34 AM    [facility] [place], [place], [place]  [address] Phone: [phone]; Fax: [phone]

## 2017-05-19 NOTE — Telephone Encounter (Signed)
Spoke with patient who called to ask about the diagnoses that are listed on his AVS from today's visit. I explained each diagnosis to him and answered questions to his satisfaction. He thanked me for the call.

## 2017-05-31 ENCOUNTER — Other Ambulatory Visit: Payer: Self-pay | Admitting: Cardiovascular Disease

## 2017-05-31 MED ORDER — LOSARTAN POTASSIUM 100 MG PO TABS: 100.0000 mg | ORAL_TABLET | Freq: Every day | ORAL | 3 refills | Status: DC

## 2017-05-31 MED ORDER — METOPROLOL TARTRATE 25 MG PO TABS: 25.0000 mg | ORAL_TABLET | Freq: Two times a day (BID) | ORAL | 3 refills | Status: DC

## 2017-06-07 DIAGNOSIS — I739 Peripheral vascular disease, unspecified: Secondary | ICD-10-CM | POA: Diagnosis not present

## 2017-08-12 IMAGING — DX DG CHEST 2V
2 series · 2 of 2 positions shown · non-contrast
Comparison: 08/04/2015

CLINICAL DATA: Tightness in soreness in chest and abdomen,
mellitus

EXAM:
CHEST  2 VIEW

[chest pa]
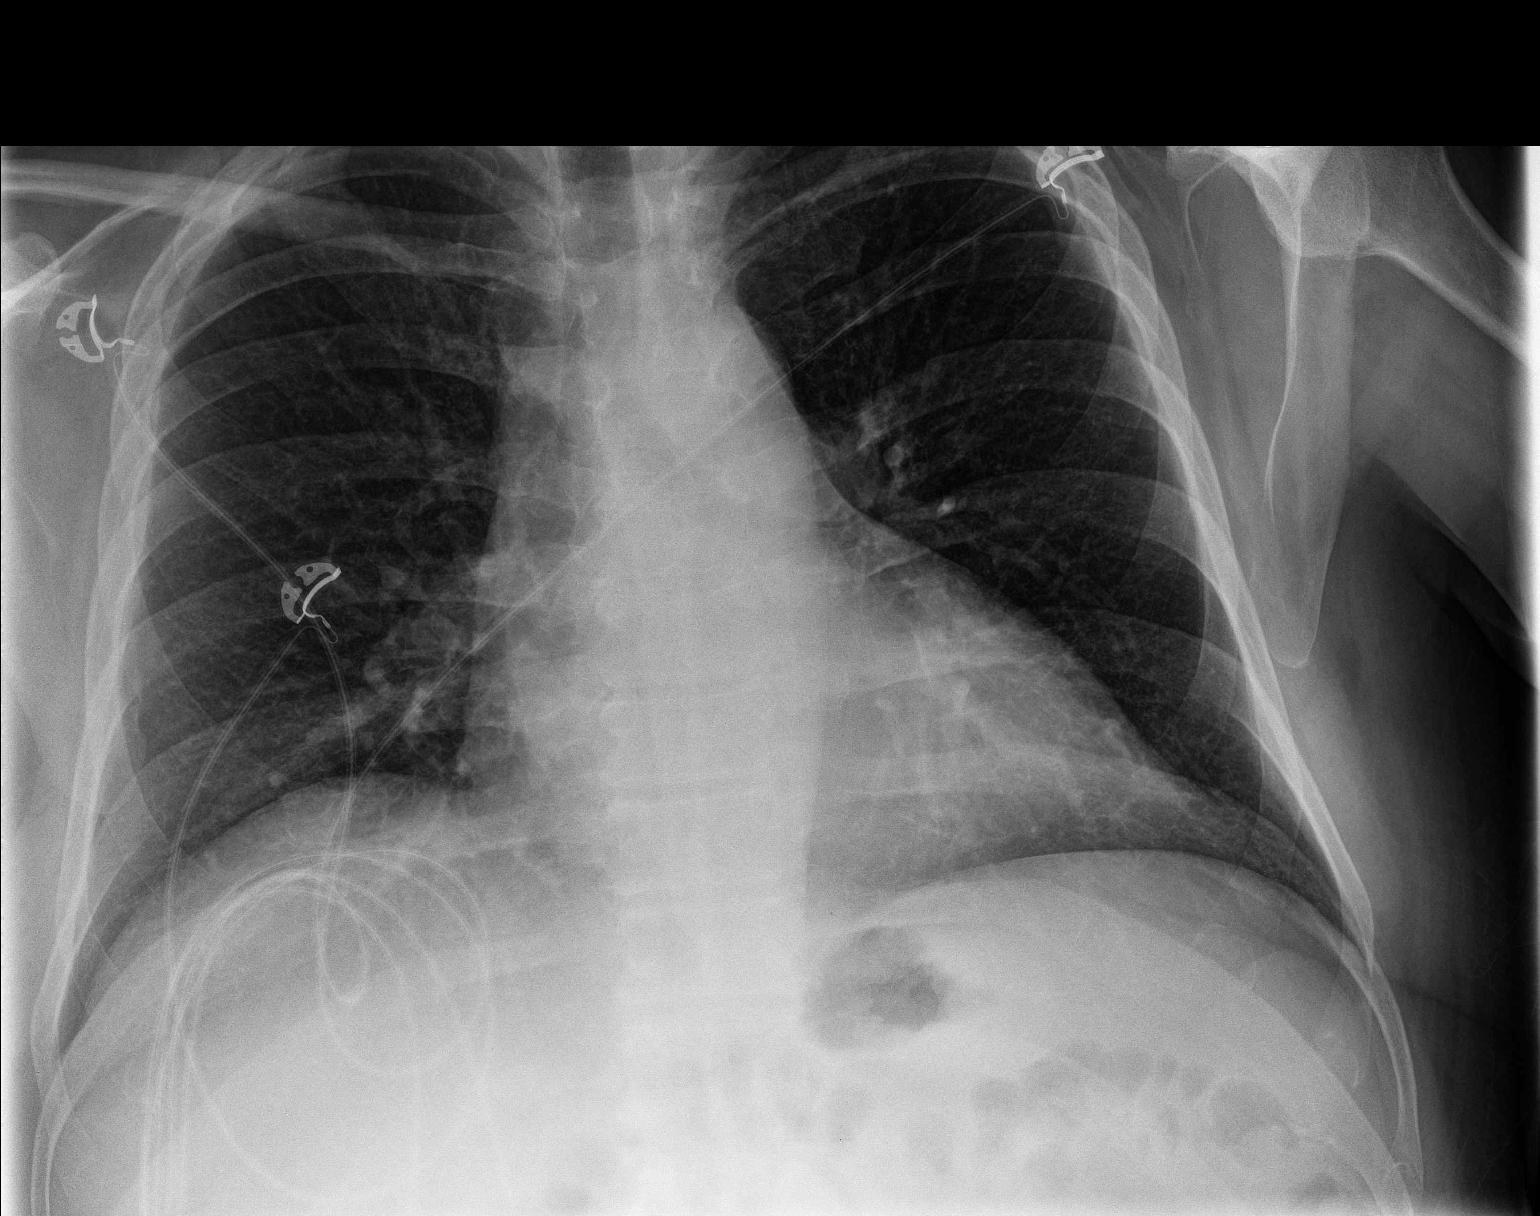

[chest lat]
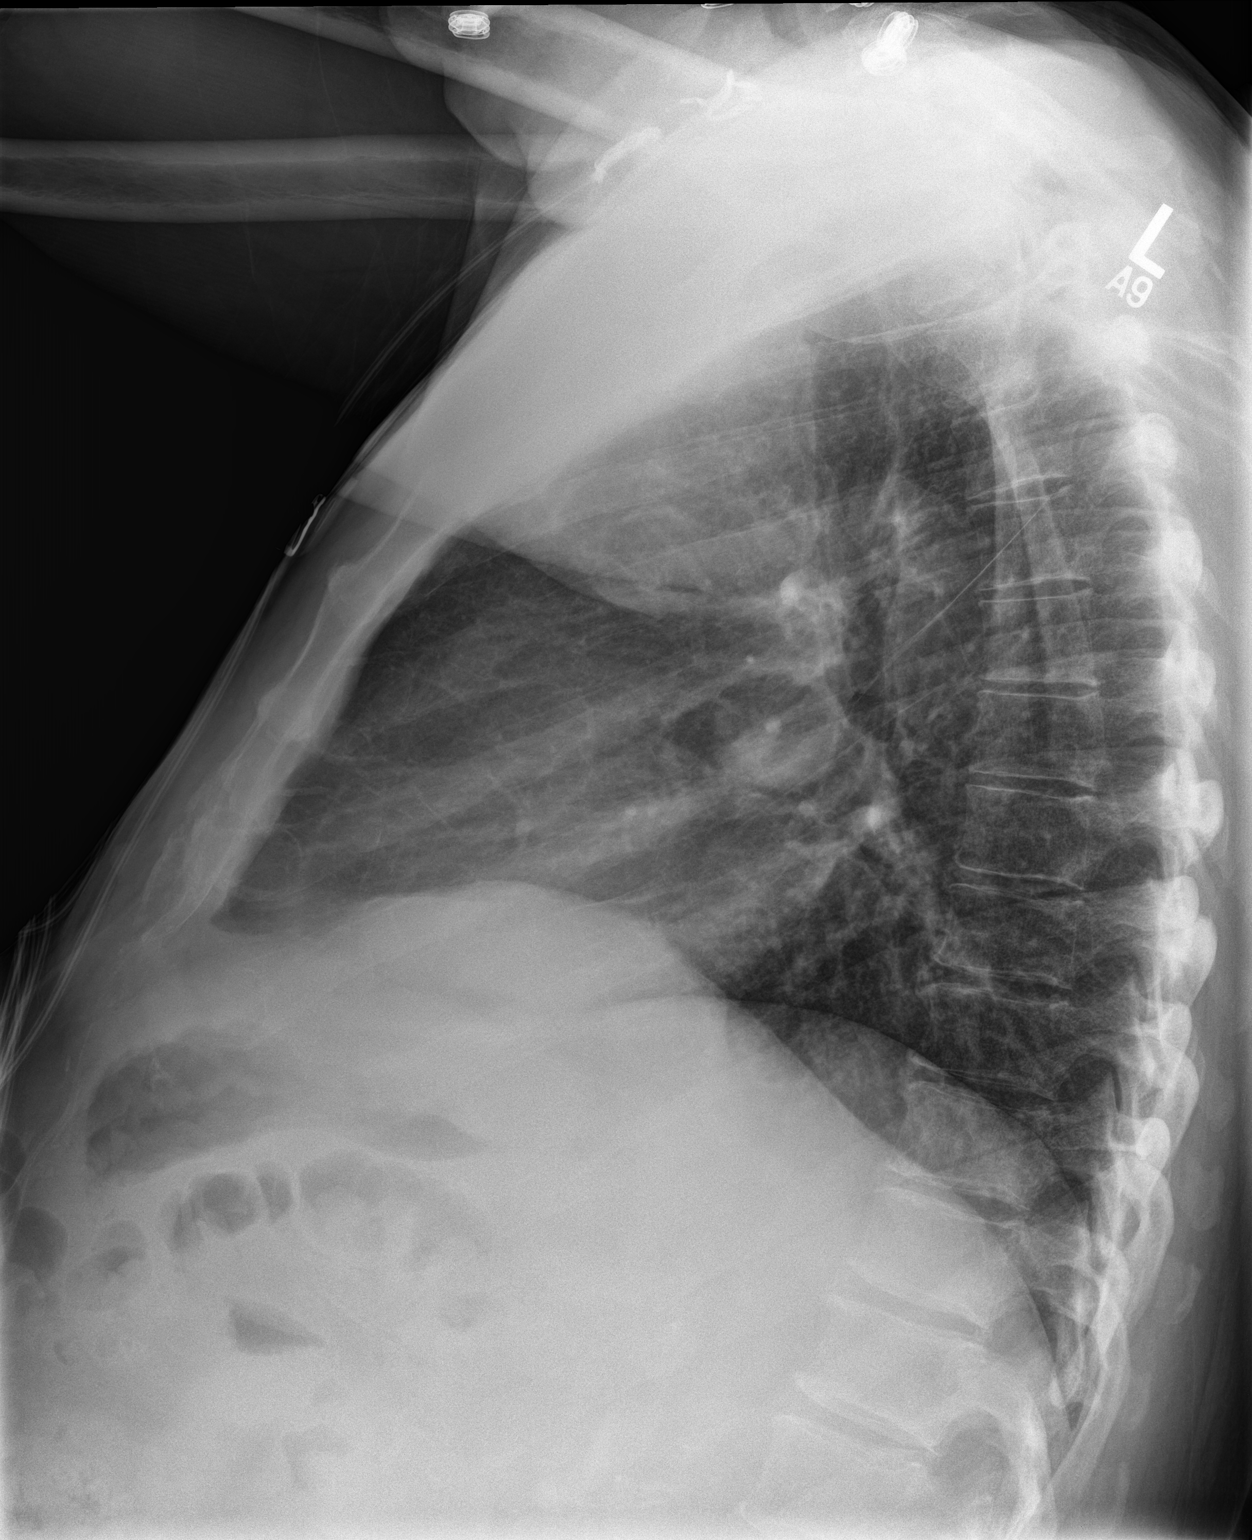

[2 of 2 positions shown; findings below may reference images not displayed]

FINDINGS: Mild enlargement of cardiac silhouette.

Mediastinal contours and pulmonary vascularity normal.

Lungs clear.

No pleural effusion or pneumothorax.

Bones unremarkable.
IMPRESSION: Mild enlargement of cardiac silhouette.

No acute abnormalities.

## 2017-08-17 DIAGNOSIS — I10 Essential (primary) hypertension: Secondary | ICD-10-CM | POA: Diagnosis not present

## 2017-08-17 DIAGNOSIS — E559 Vitamin D deficiency, unspecified: Secondary | ICD-10-CM | POA: Diagnosis not present

## 2017-08-17 DIAGNOSIS — E785 Hyperlipidemia, unspecified: Secondary | ICD-10-CM | POA: Diagnosis not present

## 2017-08-17 DIAGNOSIS — E1165 Type 2 diabetes mellitus with hyperglycemia: Secondary | ICD-10-CM | POA: Diagnosis not present

## 2017-08-17 DIAGNOSIS — F3112 Bipolar disorder, current episode manic without psychotic features, moderate: Secondary | ICD-10-CM | POA: Diagnosis not present

## 2017-08-23 DIAGNOSIS — E1165 Type 2 diabetes mellitus with hyperglycemia: Secondary | ICD-10-CM | POA: Diagnosis not present

## 2017-08-23 DIAGNOSIS — I739 Peripheral vascular disease, unspecified: Secondary | ICD-10-CM | POA: Diagnosis not present

## 2017-09-13 ENCOUNTER — Other Ambulatory Visit: Payer: Self-pay

## 2017-09-13 NOTE — Telephone Encounter (Signed)
Outpatient Medication Detail    Disp Refills Start End   metoprolol tartrate (LOPRESSOR) 25 MG tablet 180 tablet 3 05/31/2017    Sig - Route: Take 1 tablet (25 mg total) by mouth 2 (two) times daily. - Oral   Sent to pharmacy as: metoprolol tartrate (LOPRESSOR) 25 MG tablet   E-Prescribing Status: Receipt confirmed by pharmacy (05/31/2017 2:06 PM EST)   Pharmacy   [facility] [place], [place]

## 2017-09-13 NOTE — Telephone Encounter (Signed)
Please review for refill. Thanks!  

## 2017-10-27 DIAGNOSIS — F3181 Bipolar II disorder: Secondary | ICD-10-CM | POA: Diagnosis not present

## 2017-10-28 IMAGING — US US ABDOMEN LIMITED
1 series · 14 of 25 positions shown · non-contrast
Comparison: CT, 08/23/2016

CLINICAL DATA: Cholecystitis.  Questionable bile duct dilation.

EXAM:
US ABDOMEN LIMITED - RIGHT UPPER QUADRANT

[Series 1: us abdomen limited · 0.27mm/px · 14 of 60 slices shown]
[im 1/60]
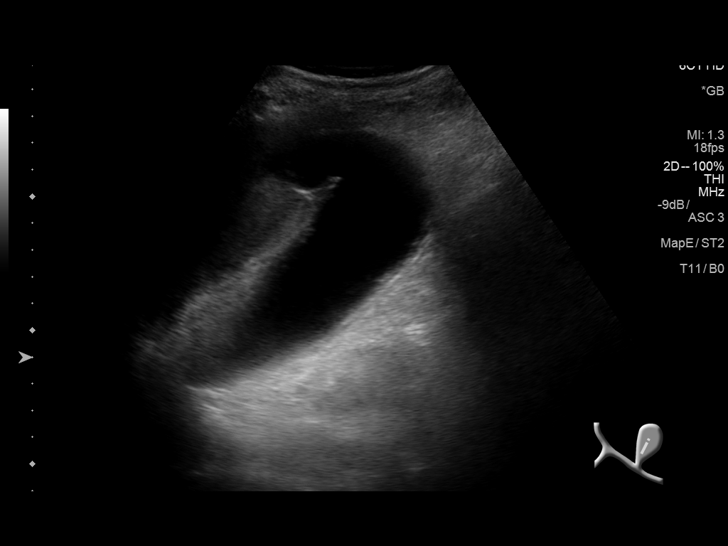
[im 5/60]
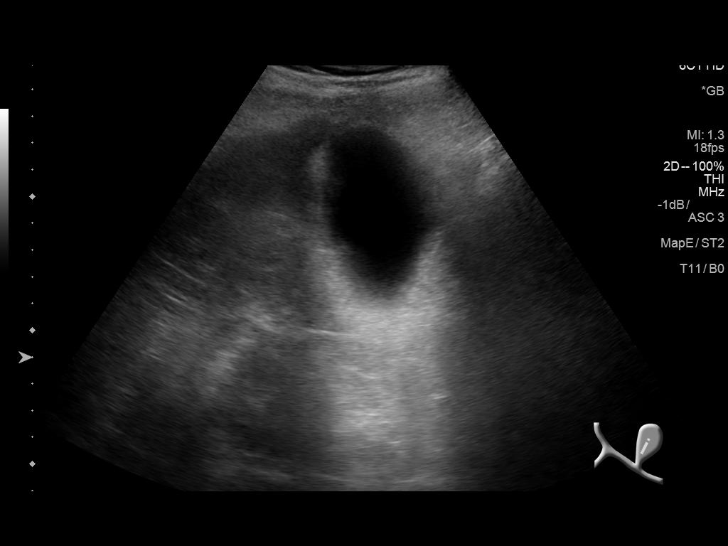
[im 10/60]
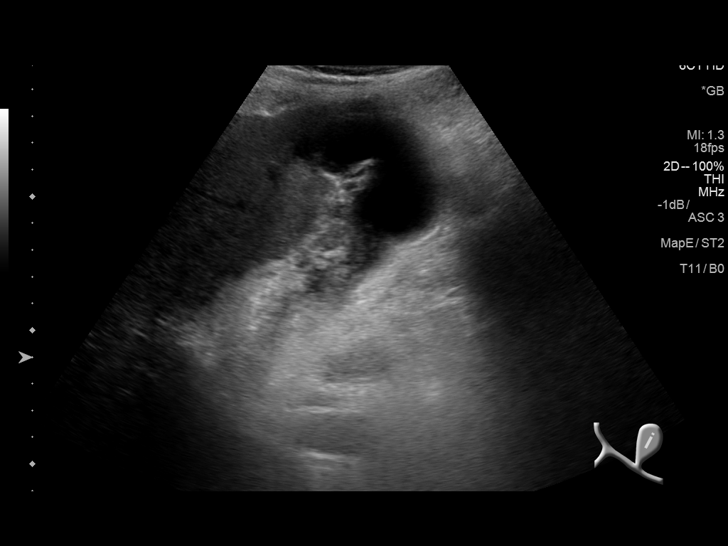
[im 15/60]
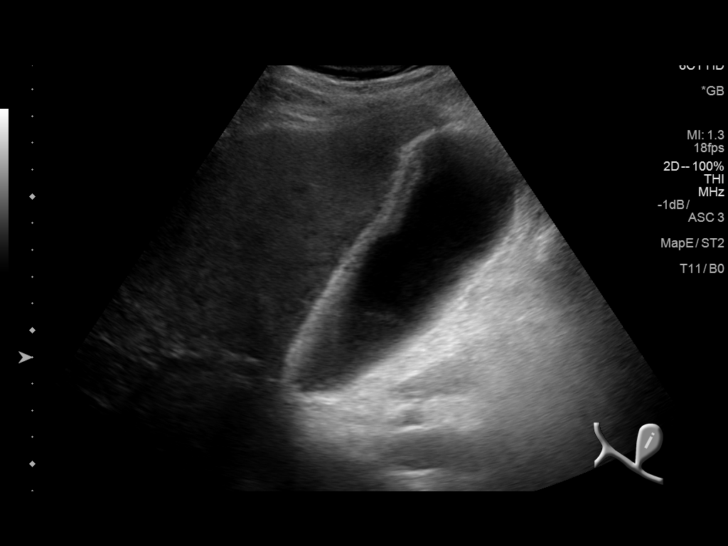
[im 20/60]
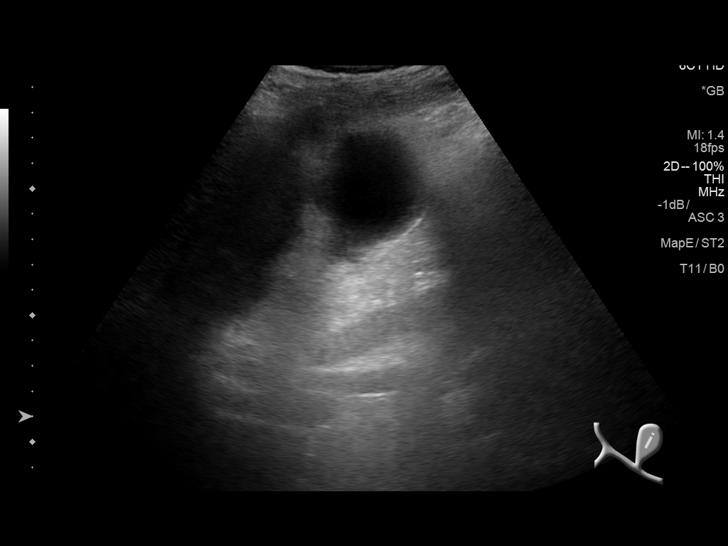
[im 23/60]
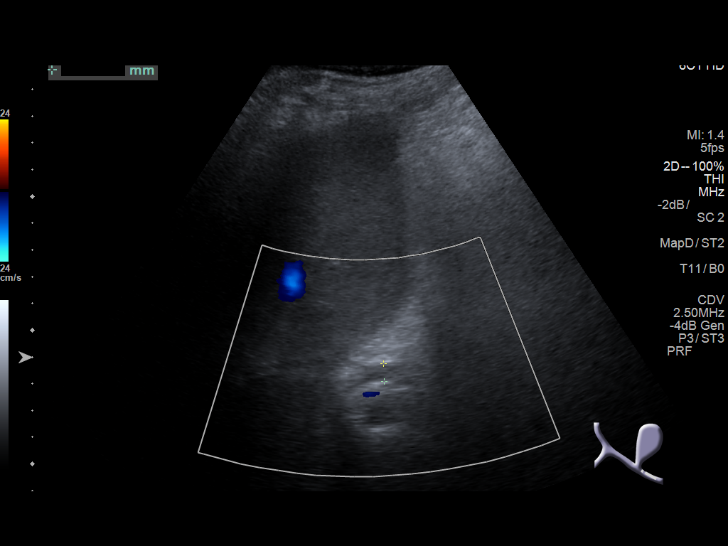
[im 28/60]
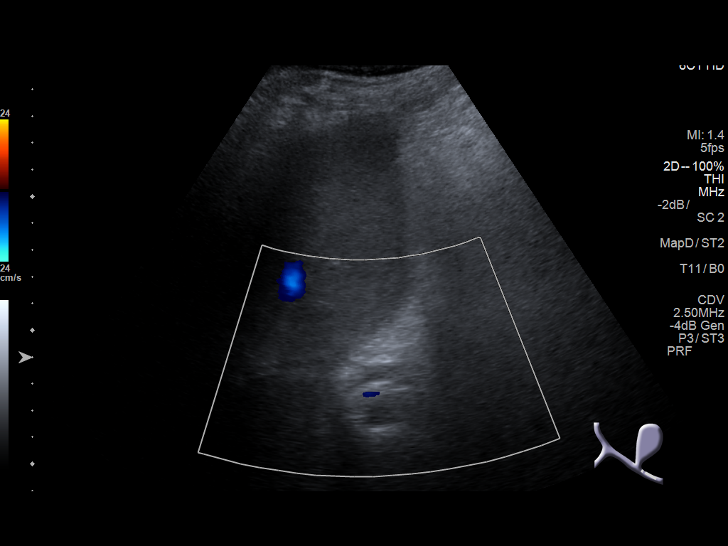
[im 32/60]
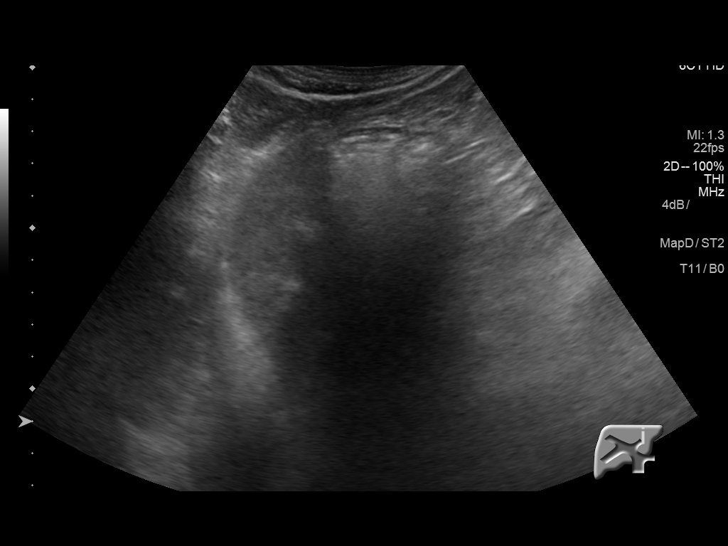
[im 37/60]
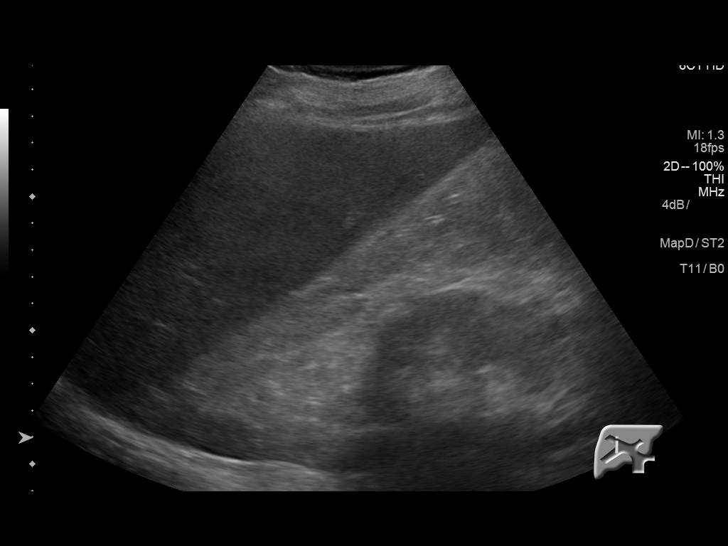
[im 40/60]
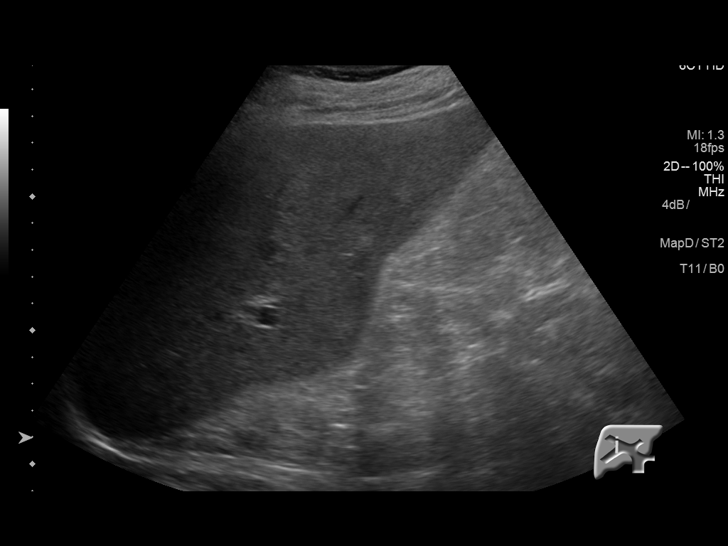
[im 45/60]
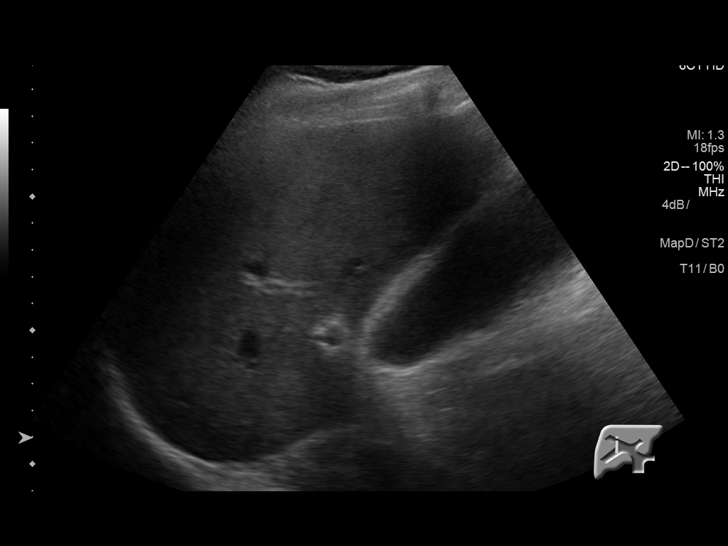
[im 50/60]
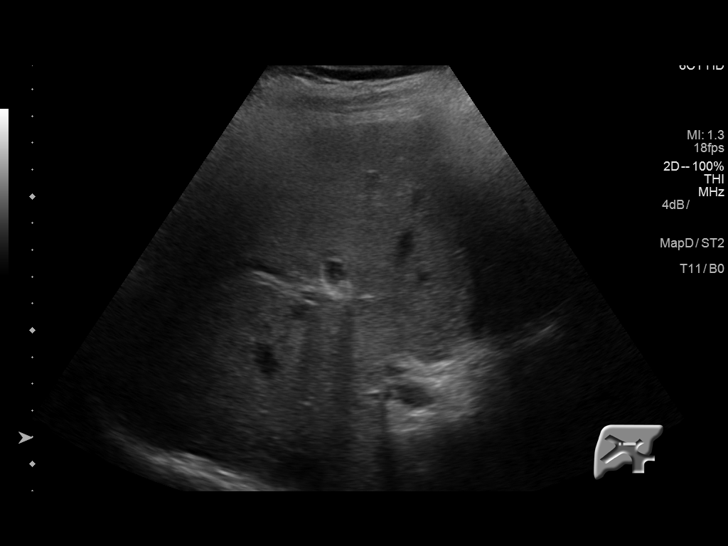
[im 55/60]
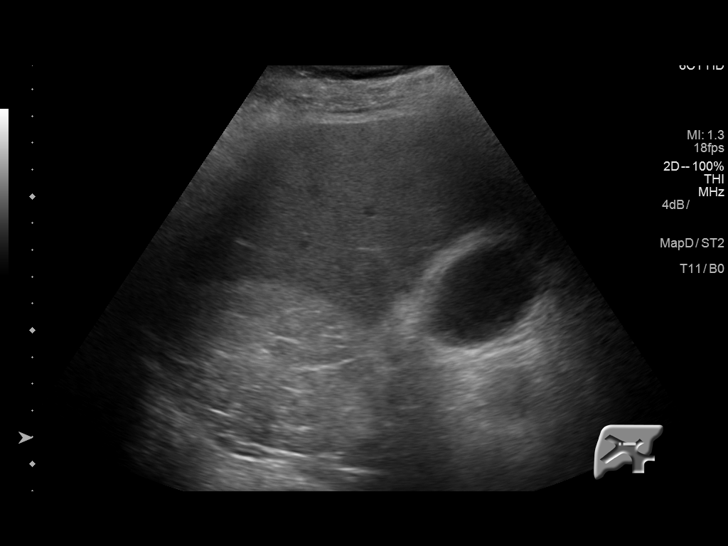
[im 60/60]
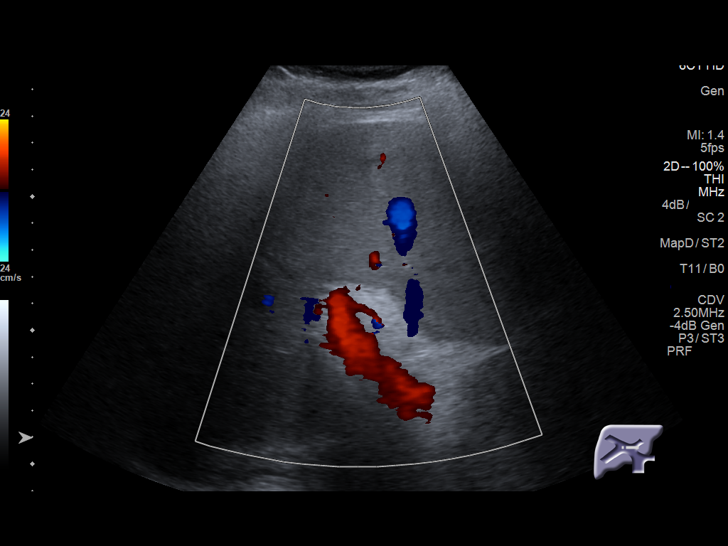

[14 of 25 positions shown; findings below may reference images not displayed]

FINDINGS: Gallbladder:

Wall is thickened to 1 cm. Small nonshadowing stone in the
gallbladder neck measuring 7 mm. Some sludge dependently. No
sonographic Murphy's sign. No pericholecystic fluid.

Common bile duct:

Diameter: 10 mm.  No duct stone visualized.

Liver:

Increased parenchymal echogenicity.  No mass or focal lesion.
IMPRESSION: 1. Findings are consistent with acute cholecystitis with gallbladder
wall thickening a small amount of sludge in 1 small stone.
2. Proximal common bile duct is dilated to 1 cm. No duct stone or
sludge is seen.
3. Hepatic steatosis.

## 2017-11-15 DIAGNOSIS — I739 Peripheral vascular disease, unspecified: Secondary | ICD-10-CM | POA: Diagnosis not present

## 2017-12-22 DIAGNOSIS — R37 Sexual dysfunction, unspecified: Secondary | ICD-10-CM | POA: Diagnosis not present

## 2017-12-22 DIAGNOSIS — E1165 Type 2 diabetes mellitus with hyperglycemia: Secondary | ICD-10-CM | POA: Diagnosis not present

## 2017-12-22 DIAGNOSIS — R351 Nocturia: Secondary | ICD-10-CM | POA: Diagnosis not present

## 2018-01-05 DIAGNOSIS — Z800 Family history of malignant neoplasm of digestive organs: Secondary | ICD-10-CM | POA: Diagnosis not present

## 2018-01-05 DIAGNOSIS — K219 Gastro-esophageal reflux disease without esophagitis: Secondary | ICD-10-CM | POA: Diagnosis not present

## 2018-01-05 DIAGNOSIS — R140 Abdominal distension (gaseous): Secondary | ICD-10-CM | POA: Diagnosis not present

## 2018-01-05 DIAGNOSIS — R1310 Dysphagia, unspecified: Secondary | ICD-10-CM | POA: Diagnosis not present

## 2018-01-18 DIAGNOSIS — Z86010 Personal history of colonic polyps: Secondary | ICD-10-CM | POA: Diagnosis not present

## 2018-01-18 DIAGNOSIS — D120 Benign neoplasm of cecum: Secondary | ICD-10-CM | POA: Diagnosis not present

## 2018-01-18 DIAGNOSIS — D125 Benign neoplasm of sigmoid colon: Secondary | ICD-10-CM | POA: Diagnosis not present

## 2018-01-18 DIAGNOSIS — R1310 Dysphagia, unspecified: Secondary | ICD-10-CM | POA: Diagnosis not present

## 2018-01-18 DIAGNOSIS — Z800 Family history of malignant neoplasm of digestive organs: Secondary | ICD-10-CM | POA: Diagnosis not present

## 2018-01-18 DIAGNOSIS — K219 Gastro-esophageal reflux disease without esophagitis: Secondary | ICD-10-CM | POA: Diagnosis not present

## 2018-01-18 DIAGNOSIS — D122 Benign neoplasm of ascending colon: Secondary | ICD-10-CM | POA: Diagnosis not present

## 2018-01-18 DIAGNOSIS — K635 Polyp of colon: Secondary | ICD-10-CM | POA: Diagnosis not present

## 2018-01-18 DIAGNOSIS — Z1211 Encounter for screening for malignant neoplasm of colon: Secondary | ICD-10-CM | POA: Diagnosis not present

## 2018-01-18 DIAGNOSIS — D124 Benign neoplasm of descending colon: Secondary | ICD-10-CM | POA: Diagnosis not present

## 2018-02-07 ENCOUNTER — Other Ambulatory Visit: Payer: Self-pay | Admitting: Nurse Practitioner

## 2018-02-07 ENCOUNTER — Ambulatory Visit: Payer: Medicare Other | Admitting: Urology

## 2018-02-07 DIAGNOSIS — N401 Enlarged prostate with lower urinary tract symptoms: Secondary | ICD-10-CM | POA: Diagnosis not present

## 2018-02-07 DIAGNOSIS — N5201 Erectile dysfunction due to arterial insufficiency: Secondary | ICD-10-CM

## 2018-02-14 DIAGNOSIS — I739 Peripheral vascular disease, unspecified: Secondary | ICD-10-CM | POA: Diagnosis not present

## 2018-03-14 ENCOUNTER — Ambulatory Visit: Payer: Medicare Other | Admitting: Urology

## 2018-03-14 DIAGNOSIS — R351 Nocturia: Secondary | ICD-10-CM | POA: Diagnosis not present

## 2018-03-14 DIAGNOSIS — N401 Enlarged prostate with lower urinary tract symptoms: Secondary | ICD-10-CM | POA: Diagnosis not present

## 2018-03-24 ENCOUNTER — Ambulatory Visit: Payer: Medicare Other | Admitting: Nurse Practitioner

## 2018-03-24 ENCOUNTER — Encounter: Payer: Self-pay | Admitting: Nurse Practitioner

## 2018-03-24 VITALS — BP 120/82 | HR 83 | Temp 97.7°F | Ht 69.0 in | Wt 226.4 lb

## 2018-03-24 DIAGNOSIS — I10 Essential (primary) hypertension: Secondary | ICD-10-CM | POA: Diagnosis not present

## 2018-03-24 DIAGNOSIS — Z0000 Encounter for general adult medical examination without abnormal findings: Secondary | ICD-10-CM

## 2018-03-24 DIAGNOSIS — Z125 Encounter for screening for malignant neoplasm of prostate: Secondary | ICD-10-CM

## 2018-03-24 DIAGNOSIS — Z7982 Long term (current) use of aspirin: Secondary | ICD-10-CM

## 2018-03-24 DIAGNOSIS — R37 Sexual dysfunction, unspecified: Secondary | ICD-10-CM

## 2018-03-24 DIAGNOSIS — E119 Type 2 diabetes mellitus without complications: Secondary | ICD-10-CM | POA: Insufficient documentation

## 2018-03-24 MED ORDER — TETANUS-DIPHTHERIA TOXOIDS TD 2-2 LF/0.5ML IM SUSP: 0.5000 mL | Freq: Once | INTRAMUSCULAR | 0 refills | Status: AC

## 2018-03-24 NOTE — Patient Instructions (Signed)

## 2018-03-24 NOTE — Progress Notes (Signed)
Men's preventive visit.   Patient Health Questionnaire (PHQ-2) is    Office Visit from 03/24/2018 in [facility]  PHQ-2 Total Score  2    . Patient is on a Regular diet. Marital status: Widowed. Relevant history for alcohol use is:  Social History   Substance and Sexual Activity  Alcohol Use No  . Relevant history for tobacco use is:  Social History   Tobacco Use  Smoking Status Never Smoker  Smokeless Tobacco Never Used  . Subjective:     Patient ID: Robert Walters , male    DOB: Oct 19, 1955 , 62 y.o.   MRN: [id number]   Chief Complaint  Patient presents with  . Annual Exam    HPI  Hypertension  This is a chronic problem. The current episode started more than 1 year ago. The problem is unchanged. The problem is controlled. Pertinent negatives include no anxiety, blurred vision or headaches. There are no associated agents to hypertension. The current treatment provides no improvement. There is no history of angina or kidney disease.  Diabetes  He presents for his follow-up diabetic visit. He has type 2 diabetes mellitus. His disease course has been stable. Pertinent negatives for hypoglycemia include no dizziness or headaches. Pertinent negatives for diabetes include no blurred vision, no fatigue, no polydipsia, no polyphagia and no polyuria. Symptoms are stable. Risk factors for coronary artery disease include diabetes mellitus, hypertension, male sex, obesity and sedentary lifestyle. His weight is stable. He has not had a previous visit with a dietitian. He never participates in exercise. Home blood sugar record trend: Not checking his blood sugar regularly. An ACE inhibitor/angiotensin II receptor blocker is being taken. He does not see a podiatrist.Eye exam is current (He has scheduled an appt in the next 2-3 weeks.).     Past Medical History:  Diagnosis Date  . Acid reflux disease   . Bipolar 1 disorder, mixed ([place])   . BPH (benign prostatic  hypertrophy)   . CVA (cerebral vascular accident) ([place]) 07/2015  . Gastritis   . H/O hiatal hernia   . Hyperlipidemia   . Hypertension   . Normal nuclear stress test Dec 2011  . Obesity   . TIA (transient ischemic attack) 05/15/2012   "they think I've had one this am @ 0130" (05/15/2012)  . Type II diabetes mellitus ([place])   . Vertigo      Family History  Problem Relation Age of Onset  . Hyperlipidemia Father        in a nursing home  . Colon cancer Father   . Stroke Mother 36     Current Outpatient Medications:  .  aspirin EC 81 MG tablet, Take 1 tablet (81 mg total) by mouth daily., Disp: , Rfl:  .  atorvastatin (LIPITOR) 40 MG tablet, Take 1 tablet (40 mg total) by mouth daily at 6 PM., Disp: 30 tablet, Rfl: 1 .  Cholecalciferol (VITAMIN D) 2000 UNITS CAPS, Take 1 capsule by mouth 3 (three) times daily. , Disp: , Rfl:  .  CINNAMON PO, Take 1 tablet by mouth daily., Disp: , Rfl:  .  Coenzyme Q10 (COQ10 PO), Take 1 tablet by mouth daily., Disp: , Rfl:  .  FLUoxetine (PROZAC) 40 MG capsule, Take 40 mg by mouth daily., Disp: , Rfl:  .  glipiZIDE (GLUCOTROL) 10 MG tablet, TAKE 1 TABLET BY MOUTH EVERY DAY, Disp: 90 tablet, Rfl: 0 .  lamoTRIgine (LAMICTAL) 200 MG tablet, Take 200 mg by mouth at  bedtime. , Disp: , Rfl:  .  LORazepam (ATIVAN) 1 MG tablet, Take 1 mg by mouth 2 (two) times daily. , Disp: , Rfl:  .  losartan (COZAAR) 100 MG tablet, Take 1 tablet (100 mg total) by mouth daily., Disp: 90 tablet, Rfl: 3 .  metFORMIN (GLUCOPHAGE) 1000 MG tablet, TAKE 1 TABLET BY MOUTH TWO TIMES PER DAY, Disp: 180 tablet, Rfl: 0 .  metoprolol tartrate (LOPRESSOR) 25 MG tablet, Take 1 tablet (25 mg total) by mouth 2 (two) times daily., Disp: 180 tablet, Rfl: 3 .  Multiple Vitamin (MULTIVITAMIN WITH MINERALS) TABS tablet, Take 1 tablet by mouth daily., Disp: , Rfl:  .  OMEPRAZOLE PO, Take 10 mg by mouth daily., Disp: , Rfl:  .  Tamsulosin HCl (FLOMAX) 0.4 MG CAPS, Take 0.4 mg by mouth 2 (two)  times daily., Disp: , Rfl:    No Known Allergies   Review of Systems  Constitutional: Negative.  Negative for fatigue.  HENT: Negative.   Eyes: Negative.  Negative for blurred vision.  Respiratory: Negative.   Cardiovascular: Negative.   Gastrointestinal: Negative.   Endocrine: Negative.  Negative for polydipsia, polyphagia and polyuria.  Genitourinary: Negative.   Musculoskeletal: Negative.   Skin: Negative.   Allergic/Immunologic: Negative.   Neurological: Negative.  Negative for dizziness and headaches.  Hematological: Negative.   Psychiatric/Behavioral: Negative.      Today's Vitals   03/24/18 1102  BP: 120/82  Pulse: 83  Temp: 97.7 F (36.5 C)  TempSrc: Oral  SpO2: 95%  Weight: 226 lb 6.4 oz (102.7 kg)  Height: 5\' 9"  (1.753 m)  PainSc: 0-No pain   Body mass index is 33.43 kg/m.   Objective:  Physical Exam  Constitutional: He is oriented to person, place, and time. He appears well-developed and well-nourished.  HENT:  Head: Normocephalic and atraumatic.  Right Ear: External ear normal.  Left Ear: External ear normal.  Nose: Nose normal.  Mouth/Throat: Oropharynx is clear and moist.  Eyes: Pupils are equal, round, and reactive to light. Conjunctivae and EOM are normal.  Neck: Normal range of motion and full passive range of motion without pain. Neck supple. Carotid bruit is not present. No edema present. No thyromegaly present.  Cardiovascular: Normal rate, regular rhythm, S1 normal, S2 normal, normal heart sounds and intact distal pulses.  No murmur heard. Pulmonary/Chest: Effort normal and breath sounds normal.  Abdominal: Soft. Normal appearance and bowel sounds are normal.  Musculoskeletal: Normal range of motion.  Neurological: He is alert and oriented to person, place, and time.  Skin: Skin is warm and dry. Capillary refill takes less than 2 seconds.  Psychiatric: He has a normal mood and affect. His speech is normal and behavior is normal. Judgment and  thought content normal. Cognition and memory are normal.        Assessment And Plan:     1. Health maintenance examination . Behavior modifications discussed and diet history reviewed.   . Pt will continue to exercise regularly and modify diet with low GI, plant based foods and decrease intake of processed foods.  . Recommend intake of daily multivitamin, Vitamin D, and calcium.  . Recommend colonoscopy for preventive screenings, as well as recommend immunizations that include influenza, TDAP, and Shingles  2. Encounter for prostate cancer screening   3. Type 2 diabetes mellitus without complication, without long-term current use of insulin (HCC)  Chronic, controlled  Continue with current medications  Urine micro albumin done in August 2019  4. Essential hypertension  Chronic, controlled  Continue with current medications  EKG done, he is to schedule an appt with his Cardiologist - POCT Urinalysis Dipstick (81002) - POCT UA - Microalbumin - EKG 12-Lead  5. Sexual dysfunction  Will check testosterone level today at his request  He does visit a Urologist regularly         [name], FNP

## 2018-03-25 LAB — CMP14 + ANION GAP
ALK PHOS: 123 IU/L — AB (ref 39–117)
ALT: 44 IU/L (ref 0–44)
ANION GAP: 17 mmol/L (ref 10.0–18.0)
AST: 30 IU/L (ref 0–40)
Albumin/Globulin Ratio: 2.3 — ABNORMAL HIGH (ref 1.2–2.2)
Albumin: 4.5 g/dL (ref 3.6–4.8)
BILIRUBIN TOTAL: 0.7 mg/dL (ref 0.0–1.2)
BUN/Creatinine Ratio: 16 (ref 10–24)
BUN: 26 mg/dL (ref 8–27)
CO2: 20 mmol/L (ref 20–29)
CREATININE: 1.59 mg/dL — AB (ref 0.76–1.27)
Calcium: 9.2 mg/dL (ref 8.6–10.2)
Chloride: 98 mmol/L (ref 96–106)
GFR calc non Af Amer: 45 mL/min/{1.73_m2} — ABNORMAL LOW (ref >59)
GFR, EST AFRICAN AMERICAN: 52 mL/min/{1.73_m2} — AB (ref >59)
GLOBULIN, TOTAL: 2 g/dL (ref 1.5–4.5)
GLUCOSE: 109 mg/dL — AB (ref 65–99)
Potassium: 5 mmol/L (ref 3.5–5.2)
SODIUM: 135 mmol/L (ref 134–144)
TOTAL PROTEIN: 6.5 g/dL (ref 6.0–8.5)

## 2018-03-25 LAB — LIPID PANEL
Chol/HDL Ratio: 3.9 ratio (ref 0.0–5.0)
Cholesterol, Total: 181 mg/dL (ref 100–199)
HDL: 46 mg/dL (ref >39)
LDL Calculated: 83 mg/dL (ref 0–99)
TRIGLYCERIDES: 260 mg/dL — AB (ref 0–149)
VLDL Cholesterol Cal: 52 mg/dL — ABNORMAL HIGH (ref 5–40)

## 2018-03-25 LAB — TESTOSTERONE: Testosterone: 298 ng/dL (ref 264–916)

## 2018-03-25 LAB — HEMOGLOBIN A1C
ESTIMATED AVERAGE GLUCOSE: 140 mg/dL
HEMOGLOBIN A1C: 6.5 % — AB (ref 4.8–5.6)

## 2018-03-25 LAB — PSA: PROSTATE SPECIFIC AG, SERUM: 0.4 ng/mL (ref 0.0–4.0)

## 2018-03-31 ENCOUNTER — Encounter: Payer: Self-pay | Admitting: Emergency Medicine

## 2018-03-31 DIAGNOSIS — G4700 Insomnia, unspecified: Secondary | ICD-10-CM | POA: Insufficient documentation

## 2018-03-31 DIAGNOSIS — F3181 Bipolar II disorder: Secondary | ICD-10-CM

## 2018-03-31 DIAGNOSIS — F419 Anxiety disorder, unspecified: Secondary | ICD-10-CM

## 2018-04-11 ENCOUNTER — Ambulatory Visit: Payer: Medicare Other | Admitting: Urology

## 2018-04-11 DIAGNOSIS — R351 Nocturia: Secondary | ICD-10-CM | POA: Diagnosis not present

## 2018-04-11 DIAGNOSIS — N401 Enlarged prostate with lower urinary tract symptoms: Secondary | ICD-10-CM

## 2018-04-11 DIAGNOSIS — N5201 Erectile dysfunction due to arterial insufficiency: Secondary | ICD-10-CM

## 2018-04-13 ENCOUNTER — Ambulatory Visit: Payer: Self-pay | Admitting: Physician Assistant

## 2018-05-15 ENCOUNTER — Other Ambulatory Visit: Payer: Self-pay | Admitting: Nurse Practitioner

## 2018-05-16 ENCOUNTER — Ambulatory Visit: Payer: Self-pay | Admitting: Physician Assistant

## 2018-05-16 DIAGNOSIS — I739 Peripheral vascular disease, unspecified: Secondary | ICD-10-CM | POA: Diagnosis not present

## 2018-06-20 ENCOUNTER — Ambulatory Visit: Payer: Self-pay | Admitting: Physician Assistant

## 2018-06-22 DIAGNOSIS — Z0120 Encounter for dental examination and cleaning without abnormal findings: Secondary | ICD-10-CM | POA: Diagnosis not present

## 2018-07-19 DIAGNOSIS — L570 Actinic keratosis: Secondary | ICD-10-CM | POA: Diagnosis not present

## 2018-07-19 DIAGNOSIS — L821 Other seborrheic keratosis: Secondary | ICD-10-CM | POA: Diagnosis not present

## 2018-07-19 DIAGNOSIS — D225 Melanocytic nevi of trunk: Secondary | ICD-10-CM | POA: Diagnosis not present

## 2018-07-19 DIAGNOSIS — Z23 Encounter for immunization: Secondary | ICD-10-CM | POA: Diagnosis not present

## 2018-07-25 ENCOUNTER — Other Ambulatory Visit: Payer: Self-pay

## 2018-07-25 ENCOUNTER — Ambulatory Visit: Payer: Medicare Other | Admitting: Urology

## 2018-07-25 DIAGNOSIS — N401 Enlarged prostate with lower urinary tract symptoms: Secondary | ICD-10-CM

## 2018-07-25 DIAGNOSIS — R351 Nocturia: Secondary | ICD-10-CM

## 2018-07-27 ENCOUNTER — Other Ambulatory Visit: Payer: Self-pay | Admitting: Physician Assistant

## 2018-07-27 ENCOUNTER — Other Ambulatory Visit: Payer: Self-pay | Admitting: Cardiovascular Disease

## 2018-07-27 ENCOUNTER — Other Ambulatory Visit: Payer: Self-pay | Admitting: Nurse Practitioner

## 2018-07-27 NOTE — Telephone Encounter (Signed)
Chart will need to be pulled not seen in epic

## 2018-07-28 ENCOUNTER — Ambulatory Visit: Payer: Self-pay | Admitting: Nurse Practitioner

## 2018-07-31 NOTE — Telephone Encounter (Signed)
Not seen in epic, can chart be pulled to check last office visit and medications

## 2018-08-01 ENCOUNTER — Other Ambulatory Visit: Payer: Self-pay

## 2018-08-01 DIAGNOSIS — I739 Peripheral vascular disease, unspecified: Secondary | ICD-10-CM | POA: Diagnosis not present

## 2018-08-01 NOTE — Telephone Encounter (Signed)
Can you submit to [facility] please, meds confirmed via paper chart. Has office visit 08/08/2018

## 2018-08-01 NOTE — Telephone Encounter (Signed)
Medications current, last fill 03/22/2019 for lorazepam Has office visit next week.

## 2018-08-05 ENCOUNTER — Other Ambulatory Visit: Payer: Self-pay | Admitting: Nurse Practitioner

## 2018-08-08 ENCOUNTER — Other Ambulatory Visit: Payer: Self-pay

## 2018-08-08 ENCOUNTER — Ambulatory Visit (INDEPENDENT_AMBULATORY_CARE_PROVIDER_SITE_OTHER): Payer: Medicare Other | Admitting: Physician Assistant

## 2018-08-08 ENCOUNTER — Encounter: Payer: Self-pay | Admitting: Physician Assistant

## 2018-08-08 DIAGNOSIS — F331 Major depressive disorder, recurrent, moderate: Secondary | ICD-10-CM | POA: Diagnosis not present

## 2018-08-08 MED ORDER — LAMOTRIGINE 150 MG PO TABS: 150.0000 mg | ORAL_TABLET | Freq: Two times a day (BID) | ORAL | 1 refills | Status: DC

## 2018-08-08 MED ORDER — FLUOXETINE HCL 60 MG PO TABS: 60.0000 mg | ORAL_TABLET | Freq: Every day | ORAL | 0 refills | Status: DC

## 2018-08-08 NOTE — Progress Notes (Signed)
[facility] Med Check  Patient ID: Robert Walters,  MRN: [id number]  PCP: [name], [place]  Date of Evaluation: 08/08/2018 Time spent:15 minutes  Chief Complaint:  Chief Complaint    Follow-up      HISTORY/CURRENT STATUS: HPI For routine med check.  Patient is overdue for routine med check.  States he has been a little more "down lately."  States that is nothing new, that he is always been like this but sometimes is worse than others.  He denies any suicidal or homicidal thoughts.  He does not have a lot of energy or motivation but that is pretty stable.  He does not want to do anything, even go see his father in the nursing home.  He isolates himself, even before the coronavirus pandemic hit.  Patient denies increased energy with decreased need for sleep, no increased talkativeness, no racing thoughts, no impulsivity or risky behaviors, no increased spending, no increased libido, no grandiosity.  Anxiety is pretty well controlled.  Ativan still helps that.  States he sleeps well.  Denies muscle or joint pain, stiffness, or dystonia.  Denies dizziness, syncope, seizures, numbness, tingling, tremor, tics, unsteady gait, slurred speech, confusion.   Individual Medical History/ Review of Systems: Changes? :No    Past medications for mental health diagnoses include: Paper chart is not available to me at this time and patient is unable to remember.  Allergies: Patient has no known allergies.  Current Medications:  Current Outpatient Medications:  .  alfuzosin (UROXATRAL) 10 MG 24 hr tablet, , Disp: , Rfl:  .  aspirin EC 81 MG tablet, Take 1 tablet (81 mg total) by mouth daily., Disp: , Rfl:  .  atorvastatin (LIPITOR) 40 MG tablet, TAKE 1 TABLET BY MOUTH EVERY NIGHT AT BEDTIME, Disp: 30 tablet, Rfl: 0 .  b complex vitamins tablet, Take 1 tablet by mouth daily., Disp: , Rfl:  .  Cholecalciferol (VITAMIN D) 2000 UNITS CAPS, Take 1 capsule by mouth 3 (three) times daily. , Disp: ,  Rfl:  .  CINNAMON PO, Take 1 tablet by mouth daily., Disp: , Rfl:  .  Coenzyme Q10 (COQ10 PO), Take 1 tablet by mouth daily., Disp: , Rfl:  .  ferrous sulfate 325 (65 FE) MG EC tablet, Take 325 mg by mouth 3 (three) times daily with meals., Disp: , Rfl:  .  glipiZIDE (GLUCOTROL) 10 MG tablet, TAKE 1 TABLET BY MOUTH EVERY DAY, Disp: 30 tablet, Rfl: 0 .  lamoTRIgine (LAMICTAL) 150 MG tablet, Take 1 tablet (150 mg total) by mouth 2 (two) times daily., Disp: 180 tablet, Rfl: 1 .  LORazepam (ATIVAN) 1 MG tablet, TAKE 1 TABLET BY MOUTH THREE TIMES DAILY AS NEEDED FOR ANXIETY. GENERIC EQUIVALENT FOR ATIVAN., Disp: 270 tablet, Rfl: 0 .  losartan (COZAAR) 100 MG tablet, Take 1 tablet (100 mg total) by mouth daily. Please make appt with [name] for future refills. 1st attempt, Disp: 90 tablet, Rfl: 1 .  metFORMIN (GLUCOPHAGE) 1000 MG tablet, TAKE 1 TABLET BY MOUTH TWO TIMES PER DAY, Disp: 60 tablet, Rfl: 0 .  metoprolol tartrate (LOPRESSOR) 25 MG tablet, Take 1 tablet (25 mg total) by mouth 2 (two) times daily., Disp: 180 tablet, Rfl: 3 .  Multiple Vitamin (MULTIVITAMIN WITH MINERALS) TABS tablet, Take 1 tablet by mouth daily., Disp: , Rfl:  .  OMEPRAZOLE PO, Take 10 mg by mouth daily., Disp: , Rfl:  .  ONE TOUCH ULTRA TEST test strip, USE 1 STRIP TO CHECK GLUCOSE THREE  TIMES DAILY AS DIRECTED, Disp: 150 each, Rfl: 0 .  FLUoxetine HCl 60 MG TABS, Take 60 mg by mouth daily., Disp: 90 tablet, Rfl: 0 .  Tamsulosin HCl (FLOMAX) 0.4 MG CAPS, Take 0.4 mg by mouth 2 (two) times daily., Disp: , Rfl:  Medication Side Effects: none  Family Medical/ Social History: Changes? No  MENTAL HEALTH EXAM:  There were no vitals taken for this visit.There is no height or weight on file to calculate BMI.  General Appearance: phone visit, unable to assess  Eye Contact:  unable to assess  Speech:  Clear and Coherent  Volume:  Normal  Mood:  Depressed  Affect:  unable to assess  Thought Process:  Goal Directed   Orientation:  Full (Time, Place, and Person)  Thought Content: Logical   Suicidal Thoughts:  No  Homicidal Thoughts:  No  Memory:  WNL  Judgement:  Good  Insight:  Good  Psychomotor Activity:  unable to assess  Concentration:  Concentration: Good  Recall:  Good  Fund of Knowledge: Good  Language: Good  Assets:  Desire for Improvement  ADL's:  Intact  Cognition: WNL  Prognosis:  Good    DIAGNOSES:    ICD-10-CM   1. Major depressive disorder, recurrent episode, moderate (HCC) F33.1     Receiving Psychotherapy: No    RECOMMENDATIONS: Discussed different options.  We can either increase the Prozac or the Lamictal.  I think going up on the Prozac would be the first choice.  He agrees. Increase Prozac to 60 mg p.o. daily. Continue Lamictal 150 mg twice daily. Continue Ativan 1 mg 3 times daily as needed anxiety. Continue multivitamins, B complex, co-Q10. Return in 2 months.    Donnal Moat, PA-C   This record has been created using [employer].  Chart creation errors have been sought, but may not always have been located and corrected. Such creation errors do not reflect on the standard of medical care.

## 2018-08-15 ENCOUNTER — Telehealth: Payer: Self-pay

## 2018-08-15 NOTE — Telephone Encounter (Signed)
Prior authorization submitted for Fluoxetine 60mg  through [facility] approved effective 08/15/2018-08/15/2019

## 2018-08-21 ENCOUNTER — Telehealth: Payer: Self-pay

## 2018-08-21 ENCOUNTER — Ambulatory Visit: Payer: Self-pay | Admitting: Nurse Practitioner

## 2018-08-21 NOTE — Telephone Encounter (Signed)
Called to see if pt wanted to make a virtual appt since he missed his appt

## 2018-08-23 ENCOUNTER — Telehealth: Payer: Self-pay | Admitting: Physician Assistant

## 2018-08-23 NOTE — Telephone Encounter (Signed)
Patient called and said that even with a PA on the 60 mg of fluoxetine it cost him $ 184.00. He can't afford that. Three 20 mg  Tabs would be cheaper. I told thre pt to take the 60 mg since he picked it up. I asked him to let us know if that 60 was working or not. He wants to take 2 40 mg to equal 80 mg. I told him only to take 60 for right now and let us know how the 60 mg is working.

## 2018-08-23 NOTE — Telephone Encounter (Signed)
Ok, thanks.

## 2018-08-24 ENCOUNTER — Ambulatory Visit: Payer: Medicare Other | Admitting: Nurse Practitioner

## 2018-08-24 ENCOUNTER — Other Ambulatory Visit: Payer: Self-pay

## 2018-08-28 ENCOUNTER — Encounter: Payer: Self-pay | Admitting: Nurse Practitioner

## 2018-08-28 ENCOUNTER — Other Ambulatory Visit: Payer: Self-pay

## 2018-08-28 ENCOUNTER — Ambulatory Visit (INDEPENDENT_AMBULATORY_CARE_PROVIDER_SITE_OTHER): Payer: Medicare Other | Admitting: Nurse Practitioner

## 2018-08-28 DIAGNOSIS — I10 Essential (primary) hypertension: Secondary | ICD-10-CM

## 2018-08-28 DIAGNOSIS — Z7189 Other specified counseling: Secondary | ICD-10-CM

## 2018-08-28 DIAGNOSIS — E119 Type 2 diabetes mellitus without complications: Secondary | ICD-10-CM

## 2018-08-28 DIAGNOSIS — R0781 Pleurodynia: Secondary | ICD-10-CM | POA: Diagnosis not present

## 2018-08-28 DIAGNOSIS — W19XXXA Unspecified fall, initial encounter: Secondary | ICD-10-CM | POA: Diagnosis not present

## 2018-08-28 NOTE — Patient Instructions (Signed)
Rib Contusion A rib contusion is a deep bruise on your rib area. Contusions are the result of a blunt trauma that causes bleeding and injury to the tissues under the skin. A rib contusion may involve bruising of the ribs and of the skin and muscles in the area. The skin over the contusion may turn blue, purple, or yellow. Minor injuries will give you a painless contusion. More severe contusions may stay painful and swollen for a few weeks. What are the causes? This condition is usually caused by a blow, trauma, or direct force to an area of the body. This often occurs while playing contact sports. What are the signs or symptoms? Symptoms of this condition include:  Swelling and redness of the injured area.  Discoloration of the injured area.  Tenderness and soreness of the injured area.  Pain with or without movement. How is this diagnosed? This condition may be diagnosed based on:  Your symptoms and medical history.  A physical exam.  Imaging tests-such as an X-ray, CT scan, or MRI-to determine if there were internal injuries or broken bones (fractures). How is this treated? This condition may be treated with:  Rest. This is often the best treatment for a rib contusion.  Icing. This reduces swelling and inflammation.  Deep-breathing exercises. These may be recommended to reduce the risk for lung collapse and pneumonia.  Medicines. Over-the-counter or prescription medicines may be given to control pain.  Injection of a numbing medicine around the nerve near your injury (nerve block). Follow these instructions at home:     Medicines  Take over-the-counter and prescription medicines only as told by your health care provider.  Do not drive or use heavy machinery while taking prescription pain medicine.  If you are taking prescription pain medicine, take actions to prevent or treat constipation. Your health care provider may recommend that you: ? Drink enough fluid to keep  your urine pale yellow. ? Eat foods that are high in fiber, such as fresh fruits and vegetables, whole grains, and beans. ? Limit foods that are high in fat and processed sugars, such as fried or sweet foods. ? Take an over-the-counter or prescription medicine for constipation. Managing pain, stiffness, and swelling  If directed, put ice on the injured area: ? Put ice in a plastic bag. ? Place a towel between your skin and the bag. ? Leave the ice on for 20 minutes, 2-3 times a day.  Rest the injured area. Avoid strenuous activity and any activities or movements that cause pain. Be careful during activities and avoid bumping the injured area.  Do not lift anything that is heavier than 5 lb (2.3 kg), or the limit that you are told, until your health care provider says that it is safe. General instructions  Do not use any products that contain nicotine or tobacco, such as cigarettes and e-cigarettes. These can delay healing. If you need help quitting, ask your health care provider.  Do deep-breathing exercises as told by your health care provider.  If you were given an incentive spirometer, use it every 1-2 hours while you are awake, or as recommended by your health care provider. This device measures how well you are filling your lungs with each breath.  Keep all follow-up visits as told by your health care provider. This is important. Contact a health care provider if you have:  Increased bruising or swelling.  Pain that is not controlled with treatment.  A fever. Get help right away if   you:  Have difficulty breathing or shortness of breath.  Develop a continual cough or you cough up thick or bloody sputum.  Feel nauseous or you vomit.  Have pain in your abdomen. Summary  A rib contusion is a deep bruise on your rib area. Contusions are the result of a blunt trauma that causes bleeding and injury to the tissues under the skin.  The skin overlying the contusion may turn  blue, purple, or yellow. Minor injuries may give you a painless contusion. More severe contusions may stay painful and swollen for a few weeks.  Rest the injured area. Avoid strenuous activity and any activities or movements that cause pain. This information is not intended to replace advice given to you by your health care provider. Make sure you discuss any questions you have with your health care provider. Document Released: 01/12/2001 Document Revised: 05/18/2017 Document Reviewed: 05/18/2017 Elsevier Interactive Patient Education  [employer]. It is important to avoid accidents which may result in broken bones.  Here are a few ideas on how to make your home safer so you will be less likely to trip or fall.  1. Use nonskid mats or non slip strips in your shower or tub, on your bathroom floor and around sinks.  If you know that you have spilled water, wipe it up! 2. In the bathroom, it is important to have properly installed grab bars on the walls or on the edge of the tub.  Towel racks are NOT strong enough for you to hold onto or to pull on for support. 3. Stairs and hallways should have enough light.  Add lamps or night lights if you need ore light. 4. It is good to have handrails on both sides of the stairs if possible.  Always fix broken handrails right away. 5. It is important to see the edges of steps.  Paint the edges of outdoor steps white so you can see them better.  Put colored tape on the edge of inside steps. 6. Throw-rugs are dangerous because they can slide.  Removing the rugs is the best idea, but if they must stay, add adhesive carpet tape to prevent slipping. 7. Do not keep things on stairs or in the halls.  Remove small furniture that blocks the halls as it may cause you to trip.  Keep telephone and electrical cords out of the way where you walk. 8. Always were sturdy, rubber-soled shoes for good support.  Never wear just socks, especially on the stairs.  Socks may cause  you to slip or fall.  Do not wear full-length housecoats as you can easily trip on the bottom.  9. Place the things you use the most on the shelves that are the easiest to reach.  If you use a stepstool, make sure it is in good condition.  If you feel unsteady, DO NOT climb, ask for help. 10. If a health professional advises you to use a cane or walker, do not be ashamed.  These items can keep you from falling and breaking your bones.

## 2018-08-28 NOTE — Progress Notes (Signed)
Virtual Visit via telephone      This visit type was conducted due to national recommendations for restrictions regarding the COVID-19 Pandemic (e.g. social distancing) in an effort to limit this patient's exposure and mitigate transmission in our community.  Patients identity confirmed using two different identifiers.  This format is felt to be most appropriate for this patient at this time.  All issues noted in this document were discussed and addressed.  No physical exam was performed (except for noted visual exam findings with Video Visits).    Date:  08/28/2018   ID:  Robert Walters, DOB 05/10/1955, MRN [id number]  Patient Location:  Home  Provider location:   Office    Chief Complaint:  HTN and DM follow up  History of Present Illness:    Robert Walters is a 63 y.o. male who presents via video conferencing for a telehealth visit today.    The patient does not have symptoms concerning for COVID-19 infection (fever, chills, cough, or new shortness of breath).   He has been to see the Urologist since being here last.  No changes made.    Hypertension  This is a chronic problem. The current episode started more than 1 year ago. Progression since onset: unable to check blood pressure  There are no associated agents to hypertension. Risk factors for coronary artery disease include male gender and sedentary lifestyle.  Fall  The accident occurred more than 1 week ago (he fell getting out of the car). There was no blood loss. The point of impact was the left elbow (left elbow and left side). Pain location: left elbow and left rib. The pain is mild. Pertinent negatives include no abdominal pain. He has tried nothing for the symptoms. The treatment provided no relief.  Diabetes  He presents for his follow-up diabetic visit. Diabetes type: prediabetes. No MedicAlert identification noted. His disease course has been stable. There are no hypoglycemic associated symptoms. Pertinent  negatives for hypoglycemia include no dizziness. There are no diabetic associated symptoms. Pertinent negatives for diabetes include no polydipsia, no polyphagia and no polyuria. There are no hypoglycemic complications. There are no diabetic complications. Risk factors for coronary artery disease include sedentary lifestyle. Current diabetic treatment includes oral agent (monotherapy). He is compliant with treatment all of the time. When asked about meal planning, he reported none. He has not had a previous visit with a dietitian. He rarely participates in exercise. There is no change in his home blood glucose trend. (Blood sugars 140-150.  ) An ACE inhibitor/angiotensin II receptor blocker is being taken. He sees a podiatrist (Seen in the last 2-3 weeks. ).Eye exam is not current (has appt rescheduled until after the COVID-19.).     Past Medical History:  Diagnosis Date   Acid reflux disease    Bipolar 1 disorder, mixed (HCC)    BPH (benign prostatic hypertrophy)    CVA (cerebral vascular accident) ([place]) 07/2015   Gastritis    H/O hiatal hernia    Hyperlipidemia    Hypertension    Normal nuclear stress test Dec 2011   Obesity    TIA (transient ischemic attack) 05/15/2012   "they think I've had one this am @ 0130" (05/15/2012)   Type II diabetes mellitus ([place])    Vertigo    Past Surgical History:  Procedure Laterality Date   BILIARY STENT PLACEMENT N/A 09/17/2016   Procedure: BILIARY STENT PLACEMENT;  Surgeon: [name], MD;  [facility];  Service:  Endoscopy;  Laterality: N/A;   CARDIOVASCULAR STRESS TEST  2009   EF 63%   CHOLECYSTECTOMY N/A 08/25/2016   Procedure: LAPAROSCOPIC CHOLECYSTECTOMY;  Surgeon: [name], MD;  Location: [facility];  Service: General;  Laterality: N/A;   COLONOSCOPY     ERCP N/A 09/17/2016   Procedure: ENDOSCOPIC RETROGRADE CHOLANGIOPANCREATOGRAPHY (ERCP);  Surgeon: [name], MD;  Location: [facility];  Service:  Endoscopy;  Laterality: N/A;  730   ERCP N/A 12/03/2016   Procedure: ENDOSCOPIC RETROGRADE CHOLANGIOPANCREATOGRAPHY (ERCP);  Surgeon: [name], MD;  Location: [facility];  Service: Gastroenterology;  Laterality: N/A;   ESOPHAGOGASTRODUODENOSCOPY     GASTROINTESTINAL STENT REMOVAL N/A 12/03/2016   Procedure: BILIARY STENT REMOVAL;  Surgeon: [name], MD;  [facility];  Service: Gastroenterology;  Laterality: N/A;   NO PAST SURGERIES     SPHINCTEROTOMY N/A 09/17/2016   Procedure: SPHINCTEROTOMY;  Surgeon: [name], MD;  [facility];  Service: Endoscopy;  Laterality: N/A;     Current Meds  Medication Sig   alfuzosin (UROXATRAL) 10 MG 24 hr tablet    aspirin EC 81 MG tablet Take 1 tablet (81 mg total) by mouth daily.   atorvastatin (LIPITOR) 40 MG tablet TAKE 1 TABLET BY MOUTH EVERY NIGHT AT BEDTIME   b complex vitamins tablet Take 1 tablet by mouth daily.   Cholecalciferol (VITAMIN D) 2000 UNITS CAPS Take 1 capsule by mouth 3 (three) times daily.    CINNAMON PO Take 1 tablet by mouth daily.   Coenzyme Q10 (COQ10 PO) Take 1 tablet by mouth daily.   ferrous sulfate 325 (65 FE) MG EC tablet Take 325 mg by mouth 3 (three) times daily with meals.   FLUoxetine HCl 60 MG TABS Take 60 mg by mouth daily.   glipiZIDE (GLUCOTROL) 10 MG tablet TAKE 1 TABLET BY MOUTH EVERY DAY   lamoTRIgine (LAMICTAL) 150 MG tablet Take 1 tablet (150 mg total) by mouth 2 (two) times daily.   LORazepam (ATIVAN) 1 MG tablet TAKE 1 TABLET BY MOUTH THREE TIMES DAILY AS NEEDED FOR ANXIETY. GENERIC EQUIVALENT FOR ATIVAN.   losartan (COZAAR) 100 MG tablet Take 1 tablet (100 mg total) by mouth daily. Please make appt with [name] for future refills. 1st attempt   metFORMIN (GLUCOPHAGE) 1000 MG tablet TAKE 1 TABLET BY MOUTH TWO TIMES PER DAY   metoprolol tartrate (LOPRESSOR) 25 MG tablet Take 1 tablet (25 mg total) by mouth 2 (two) times daily.   Multiple  Vitamin (MULTIVITAMIN WITH MINERALS) TABS tablet Take 1 tablet by mouth daily.   OMEPRAZOLE PO Take 10 mg by mouth daily.   ONE TOUCH ULTRA TEST test strip USE 1 STRIP TO CHECK GLUCOSE THREE TIMES DAILY AS DIRECTED     Allergies:   Patient has no known allergies.   Social History   Tobacco Use   Smoking status: Never Smoker   Smokeless tobacco: Never Used  Substance Use Topics   Alcohol use: No   Drug use: No     Family Hx: The patient's family history includes Colon cancer in his father; Hyperlipidemia in his father; Stroke (age of onset: 46) in his mother.  ROS:   Please see the history of present illness.    Review of Systems  Gastrointestinal: Negative for abdominal pain.  Neurological: Negative for dizziness.  Endo/Heme/Allergies: Negative for polydipsia and polyphagia.    All other systems reviewed and are negative.   Labs/Other Tests and Data  Reviewed:    Recent Labs: 03/24/2018: ALT 44; BUN 26; Creatinine, Ser 1.59; Potassium 5.0; Sodium 135   Recent Lipid Panel Lab Results  Component Value Date/Time   CHOL 181 03/24/2018 11:50 AM   TRIG 260 (H) 03/24/2018 11:50 AM   HDL 46 03/24/2018 11:50 AM   CHOLHDL 3.9 03/24/2018 11:50 AM   CHOLHDL 7.1 07/26/2015 06:47 AM   LDLCALC 83 03/24/2018 11:50 AM   LDLDIRECT 124.3 11/10/2010 09:53 AM    Wt Readings from Last 3 Encounters:  03/24/18 226 lb 6.4 oz (102.7 kg)  05/19/17 231 lb (104.8 kg)  12/03/16 211 lb (95.7 kg)     Exam:    Vital Signs:  There were no vitals taken for this visit.    Physical Exam Unable to visualize due to having a telephone visit.  Does not sound in acute distress and able to answer questions.  ASSESSMENT & PLAN:    1. Type 2 diabetes mellitus without complication, without long-term current use of insulin (HCC)  Chronic, fair control  Continue with current medications  Encouraged to limit intake of sugary foods and drinks  Encouraged to increase physical activity to 150  minutes per week as tolerated - Hemoglobin A1c; Future - CMP14 + Anion Gap; Future - Lipid panel; Future  2. Essential hypertension  No blood pressure today due to telephone visit and he does not have a blood pressure cuff.    CMP ordered to check renal function.   The importance of regular exercise and dietary modification was stressed to the patient.  - CMP14 + Anion Gap; Future  3. Fall, initial encounter  [place] while getting out of car 2 weeks ago  Offered to have him see PT but he feels he tripped over something.  4. Rib pain on left side  He is having left side rib pain offered to send for xray he declined.  Advised if he has shortness of breath he needs to be seen in ER for evaluation.    COVID-19 Education: The signs and symptoms of COVID-19 were discussed with the patient and how to seek care for testing (follow up with PCP or arrange E-visit).  The importance of social distancing was discussed today. I have advised him to be sure he is wearing a mask anytime he is going out of the home.   Patient Risk:   After full review of this patients clinical status, I feel that they are at least moderate risk at this time.  Time:   Today, I have spent 18 minutes/ seconds with the patient with telehealth technology discussing above diagnoses.     Medication Adjustments/Labs and Tests Ordered: Current medicines are reviewed at length with the patient today.  Concerns regarding medicines are outlined above.   Tests Ordered: Orders Placed This Encounter  Procedures   Hemoglobin A1c   CMP14 + Anion Gap   Lipid panel    Medication Changes: No orders of the defined types were placed in this encounter.   Disposition:  Follow up in 3 month(s)  Signed, [name], FNP

## 2018-08-29 ENCOUNTER — Telehealth: Payer: Self-pay | Admitting: Physician Assistant

## 2018-08-29 NOTE — Telephone Encounter (Signed)
Spoke with patient who confirmed all demographics.  He does not have a computer or smart phone.  Will have vitals ready for visit.    Virtual Visit Pre-Appointment Phone Call  "(Name), I am calling you today to discuss your upcoming appointment. We are currently trying to limit exposure to the virus that causes COVID-19 by seeing patients at home rather than in the office."  1. "What is the BEST phone number to call the day of the visit?" - include this in appointment notes  2. Do you have or have access to (through a family member/friend) a smartphone with video capability that we can use for your visit?" a. If yes - list this number in appt notes as cell (if different from BEST phone #) and list the appointment type as a VIDEO visit in appointment notes b. If no - list the appointment type as a PHONE visit in appointment notes  3. Confirm consent - "In the setting of the current Covid19 crisis, you are scheduled for a (phone or video) visit with your provider on (date) at (time).  Just as we do with many in-office visits, in order for you to participate in this visit, we must obtain consent.  If you'd like, I can send this to your mychart (if signed up) or email for you to review.  Otherwise, I can obtain your verbal consent now.  All virtual visits are billed to your insurance company just like a normal visit would be.  By agreeing to a virtual visit, we'd like you to understand that the technology does not allow for your provider to perform an examination, and thus may limit your provider's ability to fully assess your condition. If your provider identifies any concerns that need to be evaluated in person, we will make arrangements to do so.  Finally, though the technology is pretty good, we cannot assure that it will always work on either your or our end, and in the setting of a video visit, we may have to convert it to a phone-only visit.  In either situation, we cannot ensure that we have a  secure connection.  Are you willing to proceed?" Patient says "yes". 4. Advise patient to be prepared - "Two hours prior to your appointment, go ahead and check your blood pressure, pulse, oxygen saturation, and your weight (if you have the equipment to check those) and write them all down. When your visit starts, your provider will ask you for this information. If you have an Apple Watch or Kardia device, please plan to have heart rate information ready on the day of your appointment. Please have a pen and paper handy nearby the day of the visit as well."   5. Inform patient they will receive a phone call 15 minutes prior to their appointment time (may be from unknown caller ID) so they should be prepared to answer    TELEPHONE CALL NOTE  Robert Walters has been deemed a candidate for a follow-up tele-health visit to limit community exposure during the Covid-19 pandemic. I spoke with the patient via phone to ensure availability of phone/video source, confirm preferred email & phone number, and discuss instructions and expectations.  I reminded Robert Walters to be prepared with any vital sign and/or heart rhythm information that could potentially be obtained via home monitoring, at the time of his visit. I reminded Robert Walters to expect a phone call prior to his visit.  [name] 08/29/2018 1:21 PM  FULL LENGTH CONSENT FOR TELE-HEALTH VISIT   I hereby voluntarily request, consent and authorize [facility] and its employed or contracted physicians, physician assistants, nurse practitioners or other licensed health care professionals (the Practitioner), to provide me with telemedicine health care services (the Services") as deemed necessary by the treating Practitioner. I acknowledge and consent to receive the Services by the Practitioner via telemedicine. I understand that the telemedicine visit will involve communicating with the Practitioner through live audiovisual  communication technology and the disclosure of certain medical information by electronic transmission. I acknowledge that I have been given the opportunity to request an in-person assessment or other available alternative prior to the telemedicine visit and am voluntarily participating in the telemedicine visit.  I understand that I have the right to withhold or withdraw my consent to the use of telemedicine in the course of my care at any time, without affecting my right to future care or treatment, and that the Practitioner or I may terminate the telemedicine visit at any time. I understand that I have the right to inspect all information obtained and/or recorded in the course of the telemedicine visit and may receive copies of available information for a reasonable fee.  I understand that some of the potential risks of receiving the Services via telemedicine include:   Delay or interruption in medical evaluation due to technological equipment failure or disruption;  Information transmitted may not be sufficient (e.g. poor resolution of images) to allow for appropriate medical decision making by the Practitioner; and/or   In rare instances, security protocols could fail, causing a breach of personal health information.  Furthermore, I acknowledge that it is my responsibility to provide information about my medical history, conditions and care that is complete and accurate to the best of my ability. I acknowledge that Practitioner's advice, recommendations, and/or decision may be based on factors not within their control, such as incomplete or inaccurate data provided by me or distortions of diagnostic images or specimens that may result from electronic transmissions. I understand that the practice of medicine is not an exact science and that Practitioner makes no warranties or guarantees regarding treatment outcomes. I acknowledge that I will receive a copy of this consent concurrently upon execution via  email to the email address I last provided but may also request a printed copy by calling the office of [facility].    I understand that my insurance will be billed for this visit.   I have read or had this consent read to me.  I understand the contents of this consent, which adequately explains the benefits and risks of the Services being provided via telemedicine.   I have been provided ample opportunity to ask questions regarding this consent and the Services and have had my questions answered to my satisfaction.  I give my informed consent for the services to be provided through the use of telemedicine in my medical care  By participating in this telemedicine visit I agree to the above.

## 2018-08-29 NOTE — Progress Notes (Signed)
Virtual Visit via Telephone Note   This visit type was conducted due to national recommendations for restrictions regarding the COVID-19 Pandemic (e.g. social distancing) in an effort to limit this patient's exposure and mitigate transmission in our community.  Due to his co-morbid illnesses, this patient is at least at moderate risk for complications without adequate follow up.  This format is felt to be most appropriate for this patient at this time.  The patient did not have access to video technology/had technical difficulties with video requiring transitioning to audio format only (telephone).  All issues noted in this document were discussed and addressed.  No physical exam could be performed with this format.  Please refer to the patient's chart for his  consent to telehealth for [facility].    Patient does not have a smart phone/computer/tablet to perform video visit.  Evaluation Performed:  Follow-up visit  Date:  08/31/2018   ID:  Robert, Walters 08-19-1955, MRN [id number]  Patient Location: Home Provider Location: Home  PCP:  [name], FNP  Cardiologist:  Mertie Moores, MD  Chief Complaint:  15 months follow up  History of Present Illness:    Robert Walters is a 63 y.o. male with hx of HTN, HLD, chronic diastolic CHF, CVA, DM with gastroparesis and obesity seen for follow up.   Last echo 07/2015 showed normal LVEF, grade 1 DD, mild MR.  He was doing well when last seen by [name] 05/2017.  Today patient denies any complaint on concern.  Sedentary lifestyle.  Eats food high in fatty acids.  Reviewed lab work done by PCP, creatinine stable.  Electrolyte normal.  Elevated LFTs.  He denies chest pain, shortness of breath, lower extremity edema, palpitation, dizziness, orthopnea, PND, syncope or melena.  The patient does not have symptoms concerning for COVID-19 infection (fever, chills, cough, or new shortness of breath).    Past Medical History:  Diagnosis  Date  . Acid reflux disease   . Bipolar 1 disorder, mixed ([place])   . BPH (benign prostatic hypertrophy)   . CVA (cerebral vascular accident) ([place]) 07/2015  . Gastritis   . H/O hiatal hernia   . Hyperlipidemia   . Hypertension   . Normal nuclear stress test Dec 2011  . Obesity   . TIA (transient ischemic attack) 05/15/2012   "they think I've had one this am @ 0130" (05/15/2012)  . Type II diabetes mellitus ([place])   . Vertigo    Past Surgical History:  Procedure Laterality Date  . BILIARY STENT PLACEMENT N/A 09/17/2016   Procedure: BILIARY STENT PLACEMENT;  Surgeon: [name], MD;  Location: [facility];  Service: Endoscopy;  Laterality: N/A;  . CARDIOVASCULAR STRESS TEST  2009   EF 63%  . CHOLECYSTECTOMY N/A 08/25/2016   Procedure: LAPAROSCOPIC CHOLECYSTECTOMY;  Surgeon: [name], MD;  Location: [facility] ORS;  Service: General;  Laterality: N/A;  . COLONOSCOPY    . ERCP N/A 09/17/2016   Procedure: ENDOSCOPIC RETROGRADE CHOLANGIOPANCREATOGRAPHY (ERCP);  Surgeon: [name], MD;  Location: [facility];  Service: Endoscopy;  Laterality: N/A;  730  . ERCP N/A 12/03/2016   Procedure: ENDOSCOPIC RETROGRADE CHOLANGIOPANCREATOGRAPHY (ERCP);  Surgeon: [name], MD;  Location: [facility];  Service: Gastroenterology;  Laterality: N/A;  . ESOPHAGOGASTRODUODENOSCOPY    . GASTROINTESTINAL STENT REMOVAL N/A 12/03/2016   Procedure: BILIARY STENT REMOVAL;  Surgeon: [name], MD;  Location: [facility];  Service: Gastroenterology;  Laterality: N/A;  .  NO PAST SURGERIES    . SPHINCTEROTOMY N/A 09/17/2016   Procedure: SPHINCTEROTOMY;  Surgeon: [name], MD;  Location: [facility];  Service: Endoscopy;  Laterality: N/A;     Current Meds  Medication Sig  . alfuzosin (UROXATRAL) 10 MG 24 hr tablet Take 10 mg by mouth daily.   [name] aspirin EC 81 MG tablet Take 1 tablet (81 mg total) by mouth daily.  [name] atorvastatin (LIPITOR) 40 MG tablet TAKE 1 TABLET BY MOUTH EVERY  NIGHT AT BEDTIME  . b complex vitamins tablet Take 1 tablet by mouth daily.  . Cholecalciferol (VITAMIN D) 2000 UNITS CAPS Take 1 capsule by mouth 3 (three) times daily.   [name] CINNAMON PO Take 1 tablet by mouth daily.  . Coenzyme Q10 (COQ10 PO) Take 1 tablet by mouth daily.  . ferrous sulfate 325 (65 FE) MG EC tablet Take 325 mg by mouth 3 (three) times daily with meals.  [name] FLUoxetine HCl 60 MG TABS Take 60 mg by mouth daily.  [name] glipiZIDE (GLUCOTROL) 10 MG tablet TAKE 1 TABLET BY MOUTH EVERY DAY  . lamoTRIgine (LAMICTAL) 150 MG tablet Take 1 tablet (150 mg total) by mouth 2 (two) times daily.  [name] LORazepam (ATIVAN) 1 MG tablet TAKE 1 TABLET BY MOUTH THREE TIMES DAILY AS NEEDED FOR ANXIETY. GENERIC EQUIVALENT FOR ATIVAN.  [name] losartan (COZAAR) 100 MG tablet Take 1 tablet (100 mg total) by mouth daily. Please make appt with [name] for future refills. 1st attempt  . metFORMIN (GLUCOPHAGE) 1000 MG tablet TAKE 1 TABLET BY MOUTH TWO TIMES PER DAY  . metoprolol tartrate (LOPRESSOR) 25 MG tablet Take 1 tablet (25 mg total) by mouth 2 (two) times daily.  . Multiple Vitamin (MULTIVITAMIN WITH MINERALS) TABS tablet Take 1 tablet by mouth daily.  [name] omeprazole (PRILOSEC) 40 MG capsule Take 40 mg by mouth daily.  . ONE TOUCH ULTRA TEST test strip USE 1 STRIP TO CHECK GLUCOSE THREE TIMES DAILY AS DIRECTED     Allergies:   Patient has no known allergies.   Social History   Tobacco Use  . Smoking status: Never Smoker  . Smokeless tobacco: Never Used  Substance Use Topics  . Alcohol use: No  . Drug use: No     Family Hx: The patient's family history includes Colon cancer in his father; Hyperlipidemia in his father; Stroke (age of onset: 55) in his mother.  ROS:   Please see the history of present illness.   All other systems reviewed and are negative.   Prior CV studies:   The following studies were reviewed today: As above  Labs/Other Tests and Data Reviewed:    EKG:  No ECG reviewed.   Recent Labs: 08/30/2018: ALT 58; BUN 24; Creatinine, Ser 1.36; Potassium 5.1; Sodium 141   Recent Lipid Panel Lab Results  Component Value Date/Time   CHOL 198 08/30/2018 10:29 AM   TRIG 371 (H) 08/30/2018 10:29 AM   HDL 42 08/30/2018 10:29 AM   CHOLHDL 4.7 08/30/2018 10:29 AM   CHOLHDL 7.1 07/26/2015 06:47 AM   LDLCALC 82 08/30/2018 10:29 AM   LDLDIRECT 124.3 11/10/2010 09:53 AM    Wt Readings from Last 3 Encounters:  08/31/18 220 lb (99.8 kg)  03/24/18 226 lb 6.4 oz (102.7 kg)  05/19/17 231 lb (104.8 kg)     Objective:    Vital Signs:  Ht 5\' 9"  (1.753 m)   Wt 220 lb (99.8 kg)   BMI 32.49 kg/m  Patient  does not have a blood pressure cuff at home.  However stays his blood pressure usually runs in 130s over 70s at doctor visit.  VITAL SIGNS:  reviewed GEN:  no acute distress PSYCH:  normal affect Good spirit.  Answer question appropriately.  ASSESSMENT & PLAN:    1. HTN -Reports normal blood pressure.  No change in therapy.  2. HLD -On Lipitor 40 mg daily.  Minimally elevated LFTs.  Managed by PCP.  May hold Lipitor per primary.  Elevated triglyceride >> encouraged low-fat diet.    3. Chronic diastolic CHF -Euvolemic.  No change.  4.  Obesity -Encouraged heart healthy diet and regular exercise.  Recommended weight loss. COVID-19 Education: The signs and symptoms of COVID-19 were discussed with the patient and how to seek care for testing (follow up with PCP or arrange E-visit).  The importance of social distancing was discussed today.  Time:   Today, I have spent 8 minutes with the patient with telehealth technology discussing the above problems.     Medication Adjustments/Labs and Tests Ordered: Current medicines are reviewed at length with the patient today.  Concerns regarding medicines are outlined above.   Tests Ordered: No orders of the defined types were placed in this encounter.   Medication Changes: No orders of the defined types were placed in  this encounter.   Disposition:  Follow up in 1 year(s)  Signed, Leanor Kail, PA  08/31/2018 8:18 AM    [facility]

## 2018-08-30 ENCOUNTER — Other Ambulatory Visit: Payer: Medicare Other

## 2018-08-30 ENCOUNTER — Other Ambulatory Visit: Payer: Self-pay

## 2018-08-30 DIAGNOSIS — I10 Essential (primary) hypertension: Secondary | ICD-10-CM | POA: Diagnosis not present

## 2018-08-30 DIAGNOSIS — E119 Type 2 diabetes mellitus without complications: Secondary | ICD-10-CM

## 2018-08-31 ENCOUNTER — Telehealth (INDEPENDENT_AMBULATORY_CARE_PROVIDER_SITE_OTHER): Payer: Medicare Other | Admitting: Physician Assistant

## 2018-08-31 ENCOUNTER — Encounter: Payer: Self-pay | Admitting: Physician Assistant

## 2018-08-31 VITALS — Ht 69.0 in | Wt 220.0 lb

## 2018-08-31 DIAGNOSIS — I5032 Chronic diastolic (congestive) heart failure: Secondary | ICD-10-CM | POA: Diagnosis not present

## 2018-08-31 DIAGNOSIS — E782 Mixed hyperlipidemia: Secondary | ICD-10-CM

## 2018-08-31 DIAGNOSIS — I10 Essential (primary) hypertension: Secondary | ICD-10-CM

## 2018-08-31 LAB — HEMOGLOBIN A1C
Est. average glucose Bld gHb Est-mCnc: 148 mg/dL
Hgb A1c MFr Bld: 6.8 % — ABNORMAL HIGH (ref 4.8–5.6)

## 2018-08-31 LAB — CMP14 + ANION GAP
ALT: 58 IU/L — ABNORMAL HIGH (ref 0–44)
AST: 53 IU/L — ABNORMAL HIGH (ref 0–40)
Albumin/Globulin Ratio: 1.8 (ref 1.2–2.2)
Albumin: 4.6 g/dL (ref 3.8–4.8)
Alkaline Phosphatase: 121 IU/L — ABNORMAL HIGH (ref 39–117)
Anion Gap: 20 mmol/L — ABNORMAL HIGH (ref 10.0–18.0)
BUN/Creatinine Ratio: 18 (ref 10–24)
BUN: 24 mg/dL (ref 8–27)
Bilirubin Total: 0.6 mg/dL (ref 0.0–1.2)
CO2: 20 mmol/L (ref 20–29)
Calcium: 9.5 mg/dL (ref 8.6–10.2)
Chloride: 101 mmol/L (ref 96–106)
Creatinine, Ser: 1.36 mg/dL — ABNORMAL HIGH (ref 0.76–1.27)
GFR calc Af Amer: 63 mL/min/{1.73_m2} (ref >59)
GFR calc non Af Amer: 55 mL/min/{1.73_m2} — ABNORMAL LOW (ref >59)
Globulin, Total: 2.5 g/dL (ref 1.5–4.5)
Glucose: 143 mg/dL — ABNORMAL HIGH (ref 65–99)
Potassium: 5.1 mmol/L (ref 3.5–5.2)
Sodium: 141 mmol/L (ref 134–144)
Total Protein: 7.1 g/dL (ref 6.0–8.5)

## 2018-08-31 LAB — LIPID PANEL
Chol/HDL Ratio: 4.7 ratio (ref 0.0–5.0)
Cholesterol, Total: 198 mg/dL (ref 100–199)
HDL: 42 mg/dL (ref >39)
LDL Calculated: 82 mg/dL (ref 0–99)
Triglycerides: 371 mg/dL — ABNORMAL HIGH (ref 0–149)
VLDL Cholesterol Cal: 74 mg/dL — ABNORMAL HIGH (ref 5–40)

## 2018-08-31 NOTE — Patient Instructions (Signed)
Medication Instructions:  none If you need a refill on your cardiac medications before your next appointment, please call your pharmacy.   Lab work: none If you have labs (blood work) drawn today and your tests are completely normal, you will receive your results only by: [name] MyChart Message (if you have MyChart) OR . A paper copy in the mail If you have any lab test that is abnormal or we need to change your treatment, we will call you to review the results.  Testing/Procedures: none  Follow-Up: 1 YEAR With Dr Acie Fredrickson At [facility], you and your health needs are our priority.  As part of our continuing mission to provide you with exceptional heart care, we have created designated Provider Care Teams.  These Care Teams include your primary Cardiologist (physician) and Advanced Practice Providers (APPs -  Physician Assistants and Nurse Practitioners) who all work together to provide you with the care you need, when you need it.  Any Other Special Instructions Will Be Listed Below (If Applicable).

## 2018-09-05 NOTE — Progress Notes (Signed)
Bananas have too much sugar that is is a treat so not regularly

## 2018-09-20 ENCOUNTER — Other Ambulatory Visit: Payer: Self-pay | Admitting: Nurse Practitioner

## 2018-09-20 MED ORDER — GLIPIZIDE 10 MG PO TABS: 10.0000 mg | ORAL_TABLET | Freq: Every day | ORAL | 1 refills | Status: DC

## 2018-10-09 ENCOUNTER — Other Ambulatory Visit: Payer: Self-pay

## 2018-10-09 MED ORDER — ATORVASTATIN CALCIUM 40 MG PO TABS: 40.0000 mg | ORAL_TABLET | Freq: Every day | ORAL | 0 refills | Status: DC

## 2018-10-10 ENCOUNTER — Ambulatory Visit: Payer: Medicare Other | Admitting: Physician Assistant

## 2018-10-13 ENCOUNTER — Other Ambulatory Visit: Payer: Self-pay | Admitting: Internal Medicine

## 2018-10-17 DIAGNOSIS — I739 Peripheral vascular disease, unspecified: Secondary | ICD-10-CM | POA: Diagnosis not present

## 2018-10-19 DIAGNOSIS — H524 Presbyopia: Secondary | ICD-10-CM | POA: Diagnosis not present

## 2018-10-19 LAB — HM DIABETES EYE EXAM

## 2018-10-20 ENCOUNTER — Encounter: Payer: Self-pay | Admitting: Nurse Practitioner

## 2018-11-06 ENCOUNTER — Ambulatory Visit (INDEPENDENT_AMBULATORY_CARE_PROVIDER_SITE_OTHER): Payer: Medicare Other | Admitting: Physician Assistant

## 2018-11-06 ENCOUNTER — Encounter: Payer: Self-pay | Admitting: Physician Assistant

## 2018-11-06 ENCOUNTER — Other Ambulatory Visit: Payer: Self-pay

## 2018-11-06 DIAGNOSIS — F411 Generalized anxiety disorder: Secondary | ICD-10-CM

## 2018-11-06 DIAGNOSIS — F331 Major depressive disorder, recurrent, moderate: Secondary | ICD-10-CM

## 2018-11-06 MED ORDER — FLUOXETINE HCL 40 MG PO CAPS: 40.0000 mg | ORAL_CAPSULE | Freq: Every day | ORAL | 0 refills | Status: DC

## 2018-11-06 MED ORDER — LORAZEPAM 1 MG PO TABS: ORAL_TABLET | ORAL | 1 refills | Status: DC

## 2018-11-06 NOTE — Progress Notes (Signed)
[facility] Check  Patient ID: Robert Walters,  MRN: [id number]  PCP: [name], FNP  Date of Evaluation: 11/06/2018 Time spent:15 minutes  Chief Complaint:  Chief Complaint    Depression; Follow-up     Virtual Visit via Telephone Note  I connected with patient by a video enabled telemedicine application or telephone, with their informed consent, and verified patient privacy and that I am speaking with the correct person using two identifiers.  I am private, in my office and the patient is home.   I discussed the limitations, risks, security and privacy concerns of performing an evaluation and management service by telephone and the availability of in person appointments. I also discussed with the patient that there may be a patient responsible charge related to this service. The patient expressed understanding and agreed to proceed.   I discussed the assessment and treatment plan with the patient. The patient was provided an opportunity to ask questions and all were answered. The patient agreed with the plan and demonstrated an understanding of the instructions.   The patient was advised to call back or seek an in-person evaluation if the symptoms worsen or if the condition fails to improve as anticipated.  I provided 15 minutes of non-face-to-face time during this encounter.  HISTORY/CURRENT STATUS: HPI For routine med check.   Doing pretty well.  The Prozac 60 mg costs more than $100.  States he cannot afford that.  He went back to the 40 mg and "it works just as good as the 60 mg did.  I just want to stay on the 40."  States he is stable.  He does get down sometimes when he is thinking about stuff that is having in the past but overall he is fine.  He is able to enjoy things.  He denies isolating any more than he has to due to the coronavirus pandemic.  He denies suicidal or homicidal thoughts.  He gets pretty anxious if he does not have the Ativan.  States it really  helps.  He will sometimes get anxious and jittery inside.  He also takes it to help him rest.  Denies dizziness, syncope, seizures, numbness, tingling, tremor, tics, unsteady gait, slurred speech, confusion. Denies muscle or joint pain, stiffness, or dystonia.  Individual Medical History/ Review of Systems: Changes? :No   Allergies: Patient has no known allergies.  Current Medications:  Current Outpatient Medications:  .  alfuzosin (UROXATRAL) 10 MG 24 hr tablet, Take 10 mg by mouth daily. , Disp: , Rfl:  .  aspirin EC 81 MG tablet, Take 1 tablet (81 mg total) by mouth daily., Disp: , Rfl:  .  atorvastatin (LIPITOR) 40 MG tablet, Take 1 tablet (40 mg total) by mouth at bedtime., Disp: 90 tablet, Rfl: 0 .  b complex vitamins tablet, Take 1 tablet by mouth daily., Disp: , Rfl:  .  Cholecalciferol (VITAMIN D) 2000 UNITS CAPS, Take 1 capsule by mouth 3 (three) times daily. , Disp: , Rfl:  .  CINNAMON PO, Take 1 tablet by mouth daily., Disp: , Rfl:  .  Coenzyme Q10 (COQ10 PO), Take 1 tablet by mouth daily., Disp: , Rfl:  .  glipiZIDE (GLUCOTROL) 10 MG tablet, Take 1 tablet (10 mg total) by mouth daily., Disp: 90 tablet, Rfl: 1 .  lamoTRIgine (LAMICTAL) 150 MG tablet, Take 1 tablet (150 mg total) by mouth 2 (two) times daily., Disp: 180 tablet, Rfl: 1 .  LORazepam (ATIVAN) 1 MG tablet, TAKE 1 TABLET  BY MOUTH THREE TIMES DAILY AS NEEDED FOR ANXIETY. GENERIC EQUIVALENT FOR ATIVAN., Disp: 270 tablet, Rfl: 1 .  losartan (COZAAR) 100 MG tablet, Take 1 tablet (100 mg total) by mouth daily. Please make appt with [name] for future refills. 1st attempt, Disp: 90 tablet, Rfl: 1 .  metFORMIN (GLUCOPHAGE) 1000 MG tablet, TAKE 1 TABLET BY MOUTH 2 TIMES PER DAY, Disp: 60 tablet, Rfl: 0 .  metoprolol tartrate (LOPRESSOR) 25 MG tablet, Take 1 tablet (25 mg total) by mouth 2 (two) times daily., Disp: 180 tablet, Rfl: 3 .  Multiple Vitamin (MULTIVITAMIN WITH MINERALS) TABS tablet, Take 1 tablet by mouth daily.,  Disp: , Rfl:  .  omeprazole (PRILOSEC) 40 MG capsule, Take 40 mg by mouth daily., Disp: , Rfl:  .  ONE TOUCH ULTRA TEST test strip, USE 1 STRIP TO CHECK GLUCOSE THREE TIMES DAILY AS DIRECTED, Disp: 150 each, Rfl: 0 .  ferrous sulfate 325 (65 FE) MG EC tablet, Take 325 mg by mouth 3 (three) times daily with meals., Disp: , Rfl:  Medication Side Effects: none  Family Medical/ Social History: Changes? No  MENTAL HEALTH EXAM:  There were no vitals taken for this visit.There is no height or weight on file to calculate BMI.  General Appearance: unable to assess  Eye Contact:  unable to assess  Speech:  Clear and Coherent  Volume:  Normal  Mood:  Euthymic  Affect:  unable to assess  Thought Process:  Goal Directed  Orientation:  Full (Time, Place, and Person)  Thought Content: Logical   Suicidal Thoughts:  No  Homicidal Thoughts:  No  Memory:  WNL  Judgement:  Good  Insight:  Good  Psychomotor Activity:  unable to assess  Concentration:  Concentration: Good  Recall:  Good  Fund of Knowledge: Good  Language: Good  Assets:  Desire for Improvement  ADL's:  Intact  Cognition: WNL  Prognosis:  Good    DIAGNOSES:    ICD-10-CM   1. Major depressive disorder, recurrent episode, moderate (HCC)  F33.1   2. Generalized anxiety disorder  F41.1     Receiving Psychotherapy: No    RECOMMENDATIONS:  Continue Lamictal 150 mg p.o. twice daily. Continue Ativan 1 mg 3 times daily as needed. Continue Prozac 40 mg daily. Return in 6 months.  Donnal Moat, PA-C   This record has been created using [employer].  Chart creation errors have been sought, but may not always have been located and corrected. Such creation errors do not reflect on the standard of medical care.

## 2018-11-14 ENCOUNTER — Telehealth: Payer: Self-pay

## 2018-11-14 NOTE — Telephone Encounter (Signed)
Patient called to confirm appt for 07/29. YRL,RMA

## 2018-11-22 ENCOUNTER — Other Ambulatory Visit: Payer: Self-pay | Admitting: Physician Assistant

## 2018-11-29 ENCOUNTER — Encounter: Payer: Self-pay | Admitting: Nurse Practitioner

## 2018-11-29 ENCOUNTER — Ambulatory Visit (INDEPENDENT_AMBULATORY_CARE_PROVIDER_SITE_OTHER): Payer: Medicare Other | Admitting: Nurse Practitioner

## 2018-11-29 ENCOUNTER — Other Ambulatory Visit: Payer: Self-pay

## 2018-11-29 VITALS — BP 136/88 | HR 83 | Temp 98.8°F | Ht 70.6 in | Wt 228.8 lb

## 2018-11-29 DIAGNOSIS — E119 Type 2 diabetes mellitus without complications: Secondary | ICD-10-CM | POA: Diagnosis not present

## 2018-11-29 DIAGNOSIS — I10 Essential (primary) hypertension: Secondary | ICD-10-CM | POA: Diagnosis not present

## 2018-11-29 DIAGNOSIS — F3181 Bipolar II disorder: Secondary | ICD-10-CM

## 2018-11-29 DIAGNOSIS — Z79899 Other long term (current) drug therapy: Secondary | ICD-10-CM | POA: Diagnosis not present

## 2018-11-29 NOTE — Progress Notes (Signed)
. Subjective:     Patient ID: Robert Walters , male    DOB: 02/23/1956 , 63 y.o.   MRN: [id number]   Chief Complaint  Patient presents with  . Diabetes  . Hypertension    HPI  Hypertension This is a chronic problem. The current episode started more than 1 year ago. The problem is unchanged. The problem is controlled. Pertinent negatives include no anxiety, blurred vision or headaches. There are no associated agents to hypertension. Risk factors for coronary artery disease include obesity, sedentary lifestyle, dyslipidemia, male gender and diabetes mellitus. Past treatments include angiotensin blockers and beta blockers. The current treatment provides no improvement. There is no history of angina or kidney disease. There is no history of chronic renal disease.  Diabetes He presents for his follow-up diabetic visit. He has type 2 diabetes mellitus. His disease course has been stable. Pertinent negatives for hypoglycemia include no dizziness or headaches. Pertinent negatives for diabetes include no blurred vision, no fatigue, no polydipsia, no polyphagia and no polyuria. Symptoms are stable. Risk factors for coronary artery disease include diabetes mellitus, hypertension, male sex, obesity and sedentary lifestyle. His weight is stable. He is following a generally healthy diet. He has not had a previous visit with a dietitian. He never participates in exercise. Home blood sugar record trend: he says the Insurance nurse checked his A1c was 5.9. An ACE inhibitor/angiotensin II receptor blocker is being taken. He does not see a podiatrist.Eye exam is current (done on 10/19/2018).     Past Medical History:  Diagnosis Date  . Acid reflux disease   . Bipolar 1 disorder, mixed ([place])   . BPH (benign prostatic hypertrophy)   . CVA (cerebral vascular accident) ([place]) 07/2015  . Gastritis   . H/O hiatal hernia   . Hyperlipidemia   . Hypertension   . Normal nuclear stress test Dec 2011  . Obesity   . TIA  (transient ischemic attack) 05/15/2012   "they think I've had one this am @ 0130" (05/15/2012)  . Type II diabetes mellitus ([place])   . Vertigo      Family History  Problem Relation Age of Onset  . Hyperlipidemia Father        in a nursing home  . Colon cancer Father   . Stroke Mother 24     Current Outpatient Medications:  .  alfuzosin (UROXATRAL) 10 MG 24 hr tablet, Take 10 mg by mouth daily. , Disp: , Rfl:  .  aspirin EC 81 MG tablet, Take 1 tablet (81 mg total) by mouth daily., Disp: , Rfl:  .  atorvastatin (LIPITOR) 40 MG tablet, Take 1 tablet (40 mg total) by mouth at bedtime., Disp: 90 tablet, Rfl: 0 .  b complex vitamins tablet, Take 1 tablet by mouth daily., Disp: , Rfl:  .  Cholecalciferol (VITAMIN D) 2000 UNITS CAPS, Take 1 capsule by mouth 3 (three) times daily. , Disp: , Rfl:  .  CINNAMON PO, Take 1 tablet by mouth daily., Disp: , Rfl:  .  Coenzyme Q10 (COQ10 PO), Take 1 tablet by mouth daily., Disp: , Rfl:  .  ferrous sulfate 325 (65 FE) MG EC tablet, Take 325 mg by mouth 3 (three) times daily with meals., Disp: , Rfl:  .  FLUoxetine (PROZAC) 40 MG capsule, TAKE 1 CAPSULE BY MOUTH EVERY MORNING, Disp: 90 capsule, Rfl: 3 .  glipiZIDE (GLUCOTROL) 10 MG tablet, Take 1 tablet (10 mg total) by mouth daily., Disp: 90 tablet, Rfl: 1 .  lamoTRIgine (LAMICTAL) 150 MG tablet, Take 1 tablet (150 mg total) by mouth 2 (two) times daily., Disp: 180 tablet, Rfl: 1 .  LORazepam (ATIVAN) 1 MG tablet, TAKE 1 TABLET BY MOUTH THREE TIMES DAILY AS NEEDED FOR ANXIETY. GENERIC EQUIVALENT FOR ATIVAN., Disp: 270 tablet, Rfl: 1 .  losartan (COZAAR) 100 MG tablet, Take 1 tablet (100 mg total) by mouth daily. Please make appt with [name] for future refills. 1st attempt, Disp: 90 tablet, Rfl: 1 .  metFORMIN (GLUCOPHAGE) 1000 MG tablet, TAKE 1 TABLET BY MOUTH 2 TIMES PER DAY, Disp: 60 tablet, Rfl: 0 .  metoprolol tartrate (LOPRESSOR) 25 MG tablet, Take 1 tablet (25 mg total) by mouth 2 (two) times  daily., Disp: 180 tablet, Rfl: 3 .  Multiple Vitamin (MULTIVITAMIN WITH MINERALS) TABS tablet, Take 1 tablet by mouth daily., Disp: , Rfl:  .  omeprazole (PRILOSEC) 40 MG capsule, Take 40 mg by mouth daily., Disp: , Rfl:  .  ONE TOUCH ULTRA TEST test strip, USE 1 STRIP TO CHECK GLUCOSE THREE TIMES DAILY AS DIRECTED, Disp: 150 each, Rfl: 0   No Known Allergies   Review of Systems  Constitutional: Negative.  Negative for chills and fatigue.  Eyes: Negative for blurred vision.  Respiratory: Negative.   Cardiovascular: Negative.   Gastrointestinal: Negative.   Endocrine: Negative.  Negative for polydipsia, polyphagia and polyuria.  Musculoskeletal: Negative.   Skin: Negative.   Neurological: Negative.  Negative for dizziness and headaches.  Psychiatric/Behavioral: Negative.      Today's Vitals   11/29/18 1015  BP: 136/88  Pulse: 83  Temp: 98.8 F (37.1 C)  TempSrc: Oral  SpO2: 96%  Weight: 228 lb 12.8 oz (103.8 kg)  Height: 5' 10.6" (1.793 m)   Body mass index is 32.27 kg/m.   Objective:  Physical Exam Constitutional:      Appearance: Normal appearance. He is well-developed.  Eyes:     Pupils: Pupils are equal, round, and reactive to light.  Neck:     Musculoskeletal: Full passive range of motion without pain, normal range of motion and neck supple. No edema.     Thyroid: No thyromegaly.     Vascular: No carotid bruit.  Cardiovascular:     Rate and Rhythm: Normal rate and regular rhythm.     Pulses: Normal pulses.     Heart sounds: Normal heart sounds, S1 normal and S2 normal. No murmur.  Pulmonary:     Effort: Pulmonary effort is normal.     Breath sounds: Normal breath sounds.  Abdominal:     General: Bowel sounds are normal.     Palpations: Abdomen is soft.  Skin:    General: Skin is warm and dry.     Capillary Refill: Capillary refill takes less than 2 seconds.  Neurological:     General: No focal deficit present.     Mental Status: He is alert and oriented  to person, place, and time.  Psychiatric:        Mood and Affect: Mood normal.        Speech: Speech normal.        Behavior: Behavior normal.        Thought Content: Thought content normal.        Judgment: Judgment normal.         Assessment And Plan:   1. Type 2 diabetes mellitus without complication, without long-term current use of insulin (HCC)  Chronic, stable  Will check HgbA1c today  Continue with  current medications  Encouraged to limit intake of sugary foods and drinks  Encouraged to increase physical activity to 150 minutes per week as tolerated - Hemoglobin A1c - CMP14 + Anion Gap - Lipid Profile - CBC no Diff  2. Essential hypertension . B/P is controlled.  . CMP ordered to check renal function.  . The importance of regular exercise and dietary modification was stressed to the patient.  - CMP14 + Anion Gap  3. Bipolar II disorder ([place])  Chronic  He is being managed by psychiatry  4. Other long term (current) drug therapy * - TSH - CBC no Diff            [name], FNP

## 2018-11-30 LAB — LIPID PANEL
Chol/HDL Ratio: 4.4 ratio (ref 0.0–5.0)
Cholesterol, Total: 175 mg/dL (ref 100–199)
HDL: 40 mg/dL (ref >39)
LDL Calculated: 62 mg/dL (ref 0–99)
Triglycerides: 366 mg/dL — ABNORMAL HIGH (ref 0–149)
VLDL Cholesterol Cal: 73 mg/dL — ABNORMAL HIGH (ref 5–40)

## 2018-11-30 LAB — CMP14 + ANION GAP
ALT: 60 IU/L — ABNORMAL HIGH (ref 0–44)
AST: 71 IU/L — ABNORMAL HIGH (ref 0–40)
Albumin/Globulin Ratio: 2.4 — ABNORMAL HIGH (ref 1.2–2.2)
Albumin: 4.8 g/dL (ref 3.8–4.8)
Alkaline Phosphatase: 123 IU/L — ABNORMAL HIGH (ref 39–117)
Anion Gap: 19 mmol/L — ABNORMAL HIGH (ref 10.0–18.0)
BUN/Creatinine Ratio: 16 (ref 10–24)
BUN: 23 mg/dL (ref 8–27)
Bilirubin Total: 0.7 mg/dL (ref 0.0–1.2)
CO2: 20 mmol/L (ref 20–29)
Calcium: 9.6 mg/dL (ref 8.6–10.2)
Chloride: 96 mmol/L (ref 96–106)
Creatinine, Ser: 1.46 mg/dL — ABNORMAL HIGH (ref 0.76–1.27)
GFR calc Af Amer: 58 mL/min/{1.73_m2} — ABNORMAL LOW (ref >59)
GFR calc non Af Amer: 50 mL/min/{1.73_m2} — ABNORMAL LOW (ref >59)
Globulin, Total: 2 g/dL (ref 1.5–4.5)
Glucose: 149 mg/dL — ABNORMAL HIGH (ref 65–99)
Potassium: 4.7 mmol/L (ref 3.5–5.2)
Sodium: 135 mmol/L (ref 134–144)
Total Protein: 6.8 g/dL (ref 6.0–8.5)

## 2018-11-30 LAB — CBC
Hematocrit: 37.6 % (ref 37.5–51.0)
Hemoglobin: 12.8 g/dL — ABNORMAL LOW (ref 13.0–17.7)
MCH: 31.3 pg (ref 26.6–33.0)
MCHC: 34 g/dL (ref 31.5–35.7)
MCV: 92 fL (ref 79–97)
Platelets: 161 10*3/uL (ref 150–450)
RBC: 4.09 x10E6/uL — ABNORMAL LOW (ref 4.14–5.80)
RDW: 13.9 % (ref 11.6–15.4)
WBC: 7 10*3/uL (ref 3.4–10.8)

## 2018-11-30 LAB — TSH: TSH: 2.7 u[IU]/mL (ref 0.450–4.500)

## 2018-11-30 LAB — HEMOGLOBIN A1C
Est. average glucose Bld gHb Est-mCnc: 160 mg/dL
Hgb A1c MFr Bld: 7.2 % — ABNORMAL HIGH (ref 4.8–5.6)

## 2018-12-09 DIAGNOSIS — F1310 Sedative, hypnotic or anxiolytic abuse, uncomplicated: Secondary | ICD-10-CM | POA: Diagnosis not present

## 2018-12-09 DIAGNOSIS — R404 Transient alteration of awareness: Secondary | ICD-10-CM | POA: Diagnosis not present

## 2018-12-09 DIAGNOSIS — R000 Tachycardia, unspecified: Secondary | ICD-10-CM | POA: Diagnosis not present

## 2018-12-09 DIAGNOSIS — R410 Disorientation, unspecified: Secondary | ICD-10-CM | POA: Diagnosis not present

## 2018-12-11 ENCOUNTER — Other Ambulatory Visit: Payer: Self-pay | Admitting: Nurse Practitioner

## 2018-12-11 ENCOUNTER — Other Ambulatory Visit: Payer: Medicare Other

## 2018-12-11 ENCOUNTER — Other Ambulatory Visit: Payer: Self-pay

## 2018-12-11 DIAGNOSIS — R748 Abnormal levels of other serum enzymes: Secondary | ICD-10-CM

## 2018-12-12 DIAGNOSIS — R748 Abnormal levels of other serum enzymes: Secondary | ICD-10-CM | POA: Diagnosis not present

## 2018-12-13 LAB — GAMMA GT: GGT: 97 IU/L — ABNORMAL HIGH (ref 0–65)

## 2018-12-18 NOTE — Progress Notes (Signed)
Have him to call for a follow up due to elevated liver enzymes.

## 2018-12-19 DIAGNOSIS — I739 Peripheral vascular disease, unspecified: Secondary | ICD-10-CM | POA: Diagnosis not present

## 2018-12-20 ENCOUNTER — Other Ambulatory Visit: Payer: Self-pay | Admitting: Cardiovascular Disease

## 2018-12-21 ENCOUNTER — Telehealth: Payer: Self-pay | Admitting: Nurse Practitioner

## 2018-12-21 NOTE — Telephone Encounter (Signed)
I left a message asking the pt to call me at [phone] to reschedule December's CPE to November (needs AWV also).

## 2018-12-24 ENCOUNTER — Other Ambulatory Visit: Payer: Self-pay | Admitting: Nurse Practitioner

## 2018-12-24 ENCOUNTER — Other Ambulatory Visit: Payer: Self-pay | Admitting: Cardiovascular Disease

## 2018-12-26 ENCOUNTER — Other Ambulatory Visit: Payer: Self-pay

## 2018-12-26 ENCOUNTER — Ambulatory Visit (INDEPENDENT_AMBULATORY_CARE_PROVIDER_SITE_OTHER): Payer: Medicare Other | Admitting: Nurse Practitioner

## 2018-12-26 ENCOUNTER — Encounter (INDEPENDENT_AMBULATORY_CARE_PROVIDER_SITE_OTHER): Payer: Self-pay | Admitting: Nurse Practitioner

## 2018-12-26 ENCOUNTER — Encounter (INDEPENDENT_AMBULATORY_CARE_PROVIDER_SITE_OTHER): Payer: Self-pay | Admitting: *Deleted

## 2018-12-26 VITALS — BP 142/84 | HR 101 | Temp 98.0°F | Ht 69.0 in | Wt 229.8 lb

## 2018-12-26 DIAGNOSIS — R7989 Other specified abnormal findings of blood chemistry: Secondary | ICD-10-CM

## 2018-12-26 DIAGNOSIS — Z86010 Personal history of colonic polyps: Secondary | ICD-10-CM | POA: Diagnosis not present

## 2018-12-26 DIAGNOSIS — R945 Abnormal results of liver function studies: Secondary | ICD-10-CM | POA: Diagnosis not present

## 2018-12-26 DIAGNOSIS — D638 Anemia in other chronic diseases classified elsewhere: Secondary | ICD-10-CM | POA: Diagnosis not present

## 2018-12-26 DIAGNOSIS — K760 Fatty (change of) liver, not elsewhere classified: Secondary | ICD-10-CM

## 2018-12-26 NOTE — Patient Instructions (Addendum)
1. Complete the ordered blood tests  2. Schedule an abdominal ultrasound  3. Avoid fatty foods, exercise as tolerated, lose 5 to 10 lbs   4. I will request a copy of your colonoscopy report from your prior GI's office   6. Follow up in our office in 4 to 6 weeks

## 2018-12-26 NOTE — Progress Notes (Signed)
 Subjective:    Patient ID: Robert Walters, male    DOB: 12/17/1955, 62 y.o.   MRN: [id number]  HPI [name] is a 62-year-old male with a past medical history of diabetes mellitus type II, CVA, TIA, hypertension, bipolar, fatty liver and GERD. He presents today for further evaluation for elevated LFTs.  He denies having any nausea.  No pruritus.  No jaundice.  His appetite is good.  He has infrequent vague discomfort above the belly button, no significant abdominal pain. No alcohol use. No NSAID use. No prior history of liver disease. No family history of liver disease. No new medications over the past year. He is passing a normal brown BM once or twice daily. No rectal bleeding, no black stool.  He reports having a colonoscopy 2 years ago by a gastroenterologist in [place], he reported having 9 polyps that were removed and he was advised to repeat a colonoscopy in 3 years.  I will request a copy of his colonoscopy procedure and biopsy reports for further review and to verify his colonoscopy recall date.  Father with history of colon cancer.  No other complaints today.  He underwent a cholecystectomy 08/25/2016 which was complicated by an infected gangrenous gallbladder.  He had a postoperative biliary leak.  He underwent an ERCP 09/18/2018 which confirmed a cystic duct leak and a sphincterotomy with a biliary stent was placed by Dr. Rehman.  A repeat ERCP with removal of the biliary stent was completed eight 07/2016.  Echo 07/27/2015: LV with mild concentric hypertrophy, normal systolic function, grade 1 diastolic dysfunction.  Past Medical History:  Diagnosis Date  . Acid reflux disease   . Bipolar 1 disorder, mixed (HCC)   . BPH (benign prostatic hypertrophy)   . CVA (cerebral vascular accident) (HCC) 07/2015  . Gastritis   . H/O hiatal hernia   . Hyperlipidemia   . Hypertension   . Normal nuclear stress test Dec 2011  . Obesity   . TIA (transient ischemic attack) 05/15/2012   "they think I've had one this am @ 0130" (05/15/2012)  . Type II diabetes mellitus (HCC)   . Vertigo    Past Surgical History:  Procedure Laterality Date  . BILIARY STENT PLACEMENT N/A 09/17/2016   Procedure: BILIARY STENT PLACEMENT;  Surgeon: Rehman, Najeeb U, MD;  [facility];  Service: Endoscopy;  Laterality: N/A;  . CARDIOVASCULAR STRESS TEST  2009   EF 63%  . CHOLECYSTECTOMY N/A 08/25/2016   Procedure: LAPAROSCOPIC CHOLECYSTECTOMY;  Surgeon: Mark Jenkins, MD;  Location: [facility];  Service: General;  Laterality: N/A;  . COLONOSCOPY    . ERCP N/A 09/17/2016   Procedure: ENDOSCOPIC RETROGRADE CHOLANGIOPANCREATOGRAPHY (ERCP);  Surgeon: Rehman, Najeeb U, MD;  [facility];  Service: Endoscopy;  Laterality: N/A;  730  . ERCP N/A 12/03/2016   Procedure: ENDOSCOPIC RETROGRADE CHOLANGIOPANCREATOGRAPHY (ERCP);  Surgeon: Rehman, Najeeb U, MD;  [facility]: [facility];  Service: Gastroenterology;  Laterality: N/A;  . ESOPHAGOGASTRODUODENOSCOPY    . GASTROINTESTINAL STENT REMOVAL N/A 12/03/2016   Procedure: BILIARY STENT REMOVAL;  Surgeon: Rehman, Najeeb U, MD;  [facility];  Service: Gastroenterology;  Laterality: N/A;  . NO PAST SURGERIES    . SPHINCTEROTOMY N/A 09/17/2016   Procedure: SPHINCTEROTOMY;  Surgeon: Rehman, Najeeb U, MD;  [facility];  Service: Endoscopy;  Laterality: N/A;   Current Outpatient Medications on File Prior to Visit  Medication Sig Dispense Refill  . aspirin EC 81 MG   tablet Take 1 tablet (81 mg total) by mouth daily.    . atorvastatin (LIPITOR) 40 MG tablet TAKE 1 TABLET BY MOUTH AT BEDTIME 90 tablet 0  . b complex vitamins tablet Take 1 tablet by mouth daily.    . Cholecalciferol (VITAMIN D) 2000 UNITS CAPS Take 1 capsule by mouth 3 (three) times daily.     . CINNAMON PO Take 1 tablet by mouth daily.    . Coenzyme Q10 (COQ10 PO) Take 1 tablet by mouth daily.    . FLUoxetine (PROZAC) 40 MG capsule TAKE 1 CAPSULE BY MOUTH EVERY  MORNING 90 capsule 3  . glipiZIDE (GLUCOTROL) 10 MG tablet Take 1 tablet (10 mg total) by mouth daily. 90 tablet 1  . lamoTRIgine (LAMICTAL) 150 MG tablet Take 1 tablet (150 mg total) by mouth 2 (two) times daily. 180 tablet 1  . LORazepam (ATIVAN) 1 MG tablet TAKE 1 TABLET BY MOUTH THREE TIMES DAILY AS NEEDED FOR ANXIETY. GENERIC EQUIVALENT FOR ATIVAN. 270 tablet 1  . losartan (COZAAR) 100 MG tablet TAKE 1 TABLET BY MOUTH DAILY. 90 tablet 2  . metFORMIN (GLUCOPHAGE) 1000 MG tablet TAKE 1 TABLET BY MOUTH 2 TIMES PER DAY 60 tablet 0  . Multiple Vitamin (MULTIVITAMIN WITH MINERALS) TABS tablet Take 1 tablet by mouth daily.    . omeprazole (PRILOSEC) 40 MG capsule Take 40 mg by mouth daily.    . OVER THE COUNTER MEDICATION Garlic 500 mg  daily    . alfuzosin (UROXATRAL) 10 MG 24 hr tablet Take 10 mg by mouth daily.     . metoprolol tartrate (LOPRESSOR) 25 MG tablet TAKE 1 TABLET BY MOUTH 2 TIMES DAILY. GENERIC EQUIVALENT FOR LOPRESSOR. 180 tablet 2  . ONE TOUCH ULTRA TEST test strip USE 1 STRIP TO CHECK GLUCOSE THREE TIMES DAILY AS DIRECTED 150 each 0   No current facility-administered medications on file prior to visit.    No Known Allergies Family History  Problem Relation Age of Onset  . Hyperlipidemia Father        in a nursing home  . Colon cancer Father   . Stroke Mother 83     Labs 11/29/2018: Sodium 135.  Potassium 4.7.  BUN 23.  Creatinine 1.46.  Alk phos 123.  Albumin 4.8.  AST 71.  ALT 60.  Total bili 0.7.  GGT 97.  WBC 7.0.  Hemoglobin 12.8.  Hematocrit 37.6.  Platelet 161.  Past Medical History:  Diagnosis Date  . Acid reflux disease   . Bipolar 1 disorder, mixed (HCC)   . BPH (benign prostatic hypertrophy)   . CVA (cerebral vascular accident) (HCC) 07/2015  . Gastritis   . H/O hiatal hernia   . Hyperlipidemia   . Hypertension   . Normal nuclear stress test Dec 2011  . Obesity   . TIA (transient ischemic attack) 05/15/2012   "they think I've had one this am @ 0130"  (05/15/2012)  . Type II diabetes mellitus (HCC)   . Vertigo    Past Surgical History:  Procedure Laterality Date  . BILIARY STENT PLACEMENT N/A 09/17/2016   Procedure: BILIARY STENT PLACEMENT;  Surgeon: Rehman, Najeeb U, MD;  [facility];  Service: Endoscopy;  Laterality: N/A;  . CARDIOVASCULAR STRESS TEST  2009   EF 63%  . CHOLECYSTECTOMY N/A 08/25/2016   Procedure: LAPAROSCOPIC CHOLECYSTECTOMY;  Surgeon: Mark Jenkins, MD;  Location: [facility];  Service: General;  Laterality: N/A;  . COLONOSCOPY    . ERCP N/A 09/17/2016     Procedure: ENDOSCOPIC RETROGRADE CHOLANGIOPANCREATOGRAPHY (ERCP);  Surgeon: [name], MD;  [facility];  Service: Endoscopy;  Laterality: N/A;  730  . ERCP N/A 12/03/2016   Procedure: ENDOSCOPIC RETROGRADE CHOLANGIOPANCREATOGRAPHY (ERCP);  Surgeon: [name], MD;  [facility];  Service: Gastroenterology;  Laterality: N/A;  . ESOPHAGOGASTRODUODENOSCOPY    . GASTROINTESTINAL STENT REMOVAL N/A 12/03/2016   Procedure: BILIARY STENT REMOVAL;  Surgeon: [name], MD;  [facility];  Service: Gastroenterology;  Laterality: N/A;  . NO PAST SURGERIES    . SPHINCTEROTOMY N/A 09/17/2016   Procedure: SPHINCTEROTOMY;  Surgeon: [name], MD;  [facility];  Service: Endoscopy;  Laterality: N/A;   Current Outpatient Medications on File Prior to Visit  Medication Sig Dispense Refill  . aspirin EC 81 MG tablet Take 1 tablet (81 mg total) by mouth daily.    [name] atorvastatin (LIPITOR) 40 MG tablet TAKE 1 TABLET BY MOUTH AT BEDTIME 90 tablet 0  . b complex vitamins tablet Take 1 tablet by mouth daily.    . Cholecalciferol (VITAMIN D) 2000 UNITS CAPS Take 1 capsule by mouth 3 (three) times daily.     [name] CINNAMON PO Take 1 tablet by mouth daily.    . Coenzyme Q10 (COQ10 PO) Take 1 tablet by mouth daily.    . ferrous sulfate 325 (65 FE) MG EC tablet Take 325 mg by mouth 3 (three) times daily with meals.    [name]  FLUoxetine (PROZAC) 40 MG capsule TAKE 1 CAPSULE BY MOUTH EVERY MORNING 90 capsule 3  . glipiZIDE (GLUCOTROL) 10 MG tablet Take 1 tablet (10 mg total) by mouth daily. 90 tablet 1  . lamoTRIgine (LAMICTAL) 150 MG tablet Take 1 tablet (150 mg total) by mouth 2 (two) times daily. 180 tablet 1  . LORazepam (ATIVAN) 1 MG tablet TAKE 1 TABLET BY MOUTH THREE TIMES DAILY AS NEEDED FOR ANXIETY. GENERIC EQUIVALENT FOR ATIVAN. 270 tablet 1  . losartan (COZAAR) 100 MG tablet TAKE 1 TABLET BY MOUTH DAILY. 90 tablet 2  . metFORMIN (GLUCOPHAGE) 1000 MG tablet TAKE 1 TABLET BY MOUTH 2 TIMES PER DAY 60 tablet 0  . metoprolol tartrate (LOPRESSOR) 25 MG tablet Take 1 tablet (25 mg total) by mouth 2 (two) times daily. 180 tablet 3  . Multiple Vitamin (MULTIVITAMIN WITH MINERALS) TABS tablet Take 1 tablet by mouth daily.    [name] omeprazole (PRILOSEC) 40 MG capsule Take 40 mg by mouth daily.    [name] OVER THE COUNTER MEDICATION Garlic 803 mg  daily    . alfuzosin (UROXATRAL) 10 MG 24 hr tablet Take 10 mg by mouth daily.     . ONE TOUCH ULTRA TEST test strip USE 1 STRIP TO CHECK GLUCOSE THREE TIMES DAILY AS DIRECTED 150 each 0   No current facility-administered medications on file prior to visit.    No Known Allergies  Review of Systems see HPI, all other systems reviewed and are negative    Objective:   Physical Exam  Blood pressure (!) 142/84, pulse (!) 101, temperature 98 F (36.7 C), temperature source Oral, height 5' 9" (1.753 m), weight 229 lb 12.8 oz (104.2 kg).  General: 63 year old male overweight in no acute distress Eyes: Sclera nonicteric, conjunctiva pink Mouth: Dentition intact, no ulcers or lesions Neck: Supple, no thyromegaly or lymphadenopathy Heart: Tachycardic without murmur Lungs: Breath sounds clear throughout abdomen: Protuberant, soft, nontender, the liver border was palpated with deep inspiration, positive bowel sounds to  all 4 quadrants, laparoscopic scars well-healed Rectal: Deferred  Extremities: No edema Neuro: Alert and oriented x4, no focal deficits    Assessment & Plan:  73.  63 year old male with a history of fatty liver with elevated LFTs.  AST 71.  ALT 60.  GGT 97.  -Abdominal sonogram to rule out cirrhosis  -Hepatic panel, hepatitis A, B and C serologies, ANA, AMA, SMA, IgG, iron, ferritin, ceruloplasmin, alpha 1 antitrypsin -Advised the patient to eat a healthy diet, avoid fatty foods, avoid eating late at night, 5 to 10 pound weight loss recommended -Follow-up in the office in 4 to 6 weeks  2.  Normocytic anemia most likely due to CKD. Cr. 1.46. BUN 23. Hemoglobin 12.8.  Hematocrit 37.6.  MCV 92. -Iron panel  3.  Positive family history of colon cancer, personal history of colon polyps -Colonoscopy procedure report and biopsy report completed 2 years ago requested  4. DM II

## 2018-12-28 ENCOUNTER — Other Ambulatory Visit: Payer: Self-pay

## 2018-12-28 ENCOUNTER — Ambulatory Visit (HOSPITAL_COMMUNITY)
Admission: RE | Admit: 2018-12-28 | Discharge: 2018-12-28 | Disposition: A | Discharge status: Home / Self Care | Payer: Medicare Other | Source: Ambulatory Visit | Attending: Nurse Practitioner | Admitting: Nurse Practitioner

## 2018-12-28 DIAGNOSIS — R7989 Other specified abnormal findings of blood chemistry: Secondary | ICD-10-CM

## 2018-12-28 DIAGNOSIS — R945 Abnormal results of liver function studies: Secondary | ICD-10-CM | POA: Insufficient documentation

## 2018-12-28 DIAGNOSIS — K760 Fatty (change of) liver, not elsewhere classified: Secondary | ICD-10-CM

## 2018-12-29 LAB — CERULOPLASMIN: Ceruloplasmin: 34 mg/dL (ref 18–36)

## 2018-12-29 LAB — HEPATITIS A ANTIBODY, TOTAL: Hepatitis A AB,Total: NONREACTIVE

## 2018-12-29 LAB — HEPATIC FUNCTION PANEL
AG Ratio: 2 (calc) (ref 1.0–2.5)
ALT: 53 U/L — ABNORMAL HIGH (ref 9–46)
AST: 56 U/L — ABNORMAL HIGH (ref 10–35)
Albumin: 4.3 g/dL (ref 3.6–5.1)
Alkaline phosphatase (APISO): 105 U/L (ref 35–144)
Bilirubin, Direct: 0.1 mg/dL (ref 0.0–0.2)
Globulin: 2.2 g/dL (calc) (ref 1.9–3.7)
Indirect Bilirubin: 0.6 mg/dL (calc) (ref 0.2–1.2)
Total Bilirubin: 0.7 mg/dL (ref 0.2–1.2)
Total Protein: 6.5 g/dL (ref 6.1–8.1)

## 2018-12-29 LAB — IRON,TIBC AND FERRITIN PANEL
%SAT: 22 % (calc) (ref 20–48)
Ferritin: 64 ng/mL (ref 24–380)
Iron: 75 ug/dL (ref 50–180)
TIBC: 348 mcg/dL (calc) (ref 250–425)

## 2018-12-29 LAB — HEPATITIS B CORE ANTIBODY, TOTAL: Hep B Core Total Ab: NONREACTIVE

## 2018-12-29 LAB — ANA: Anti Nuclear Antibody (ANA): NEGATIVE

## 2018-12-29 LAB — ANTI-SMOOTH MUSCLE ANTIBODY, IGG: Actin (Smooth Muscle) Antibody (IGG): <20 U (ref <20)

## 2018-12-29 LAB — HEPATITIS B SURFACE ANTIBODY,QUALITATIVE: Hep B S Ab: NONREACTIVE

## 2018-12-29 LAB — IGG: IgG (Immunoglobin G), Serum: 530 mg/dL — ABNORMAL LOW (ref 600–1540)

## 2018-12-29 LAB — ALPHA-1-ANTITRYPSIN: A-1 Antitrypsin, Ser: 181 mg/dL (ref 83–199)

## 2018-12-29 LAB — MITOCHONDRIAL ANTIBODIES: Mitochondrial M2 Ab, IgG: <=20 U

## 2019-01-04 ENCOUNTER — Telehealth (INDEPENDENT_AMBULATORY_CARE_PROVIDER_SITE_OTHER): Payer: Self-pay | Admitting: Nurse Practitioner

## 2019-01-04 ENCOUNTER — Ambulatory Visit: Payer: Medicare Other | Admitting: Internal Medicine

## 2019-01-04 NOTE — Telephone Encounter (Signed)
Patient called wanted labs & [place] faxed to PCP

## 2019-01-09 NOTE — Telephone Encounter (Signed)
labs & [place] faxed to PCP

## 2019-01-15 ENCOUNTER — Encounter: Payer: Self-pay | Admitting: Nurse Practitioner

## 2019-01-15 ENCOUNTER — Ambulatory Visit (INDEPENDENT_AMBULATORY_CARE_PROVIDER_SITE_OTHER): Payer: Medicare Other | Admitting: Nurse Practitioner

## 2019-01-15 ENCOUNTER — Other Ambulatory Visit: Payer: Self-pay

## 2019-01-15 VITALS — BP 140/90 | HR 90 | Temp 97.4°F | Ht 68.2 in | Wt 227.2 lb

## 2019-01-15 DIAGNOSIS — E1122 Type 2 diabetes mellitus with diabetic chronic kidney disease: Secondary | ICD-10-CM

## 2019-01-15 DIAGNOSIS — Z23 Encounter for immunization: Secondary | ICD-10-CM | POA: Diagnosis not present

## 2019-01-15 DIAGNOSIS — E119 Type 2 diabetes mellitus without complications: Secondary | ICD-10-CM

## 2019-01-15 DIAGNOSIS — N183 Chronic kidney disease, stage 3 unspecified: Secondary | ICD-10-CM

## 2019-01-15 MED ORDER — METFORMIN HCL 1000 MG PO TABS: 1000.0000 mg | ORAL_TABLET | Freq: Every day | ORAL | 0 refills | Status: DC

## 2019-01-15 NOTE — Patient Instructions (Signed)
Chronic Kidney Disease, Adult °Chronic kidney disease (CKD) happens when the kidneys are damaged over a long period of time. The kidneys are two organs that help with: °· Getting rid of waste and extra fluid from the blood. °· Making hormones that maintain the amount of fluid in your tissues and blood vessels. °· Making sure that the body has the right amount of fluids and chemicals. °Most of the time, CKD does not go away, but it can usually be controlled. Steps must be taken to slow down the kidney damage or to stop it from getting worse. If this is not done, the kidneys may stop working. °Follow these instructions at home: °Medicines °· Take over-the-counter and prescription medicines only as told by your doctor. You may need to change the amount of medicines you take. °· Do not take any new medicines unless your doctor says it is okay. Many medicines can make your kidney damage worse. °· Do not take any vitamin and supplements unless your doctor says it is okay. Many vitamins and supplements can make your kidney damage worse. °General instructions °· Follow a diet as told by your doctor. You may need to stay away from: °? Alcohol. °? Salty foods. °? Foods that are high in: °§ Potassium. °§ Calcium. °§ Protein. °· Do not use any products that contain nicotine or tobacco, such as cigarettes and e-cigarettes. If you need help quitting, ask your doctor. °· Keep track of your blood pressure at home. Tell your doctor about any changes. °· If you have diabetes, keep track of your blood sugar as told by your doctor. °· Try to stay at a healthy weight. If you need help, ask your doctor. °· Exercise at least 30 minutes a day, 5 days a week. °· Stay up-to-date with your shots (immunizations) as told by your doctor. °· Keep all follow-up visits as told by your doctor. This is important. °Contact a doctor if: °· Your symptoms get worse. °· You have new symptoms. °Get help right away if: °· You have symptoms of end-stage  kidney disease. These may include: °? Headaches. °? Numbness in your hands or feet. °? Easy bruising. °? Having hiccups often. °? Chest pain. °? Shortness of breath. °? Stopping of menstrual periods in women. °· You have a fever. °· You have very little pee (urine). °· You have pain or bleeding when you pee. °Summary °· Chronic kidney disease (CKD) happens when the kidneys are damaged over a long period of time. °· Most of the time, this condition does not go away, but it can usually be controlled. Steps must be taken to slow down the kidney damage or to stop it from getting worse. °· Treatment may include a combination of medicines and lifestyle changes. °This information is not intended to replace advice given to you by your health care provider. Make sure you discuss any questions you have with your health care provider. °Document Released: 07/14/2009 Document Revised: 04/01/2017 Document Reviewed: 05/24/2016 °Elsevier Patient Education © 2020 [employer] ° °

## 2019-01-15 NOTE — Progress Notes (Signed)
Subjective:     Patient ID: Robert Walters , male    DOB: 05-10-1955 , 63 y.o.   MRN: [id number]   Chief Complaint  Patient presents with  . ultrasound on liver    patient presents today to discuss his results    HPI  Here to discuss his results of his ultrasound which showed chronic kidney disease.     Past Medical History:  Diagnosis Date  . Acid reflux disease   . Bipolar 1 disorder, mixed ([place])   . BPH (benign prostatic hypertrophy)   . CVA (cerebral vascular accident) ([place]) 07/2015  . Gastritis   . H/O hiatal hernia   . Hyperlipidemia   . Hypertension   . Normal nuclear stress test Dec 2011  . Obesity   . TIA (transient ischemic attack) 05/15/2012   "they think I've had one this am @ 0130" (05/15/2012)  . Type II diabetes mellitus ([place])   . Vertigo      Family History  Problem Relation Age of Onset  . Hyperlipidemia Father        in a nursing home  . Colon cancer Father   . Stroke Mother 80     Current Outpatient Medications:  .  alfuzosin (UROXATRAL) 10 MG 24 hr tablet, Take 10 mg by mouth daily. , Disp: , Rfl:  .  aspirin EC 81 MG tablet, Take 1 tablet (81 mg total) by mouth daily., Disp: , Rfl:  .  atorvastatin (LIPITOR) 40 MG tablet, TAKE 1 TABLET BY MOUTH AT BEDTIME, Disp: 90 tablet, Rfl: 0 .  b complex vitamins tablet, Take 1 tablet by mouth daily., Disp: , Rfl:  .  Cholecalciferol (VITAMIN D) 2000 UNITS CAPS, Take 1 capsule by mouth 3 (three) times daily. , Disp: , Rfl:  .  CINNAMON PO, Take 1 tablet by mouth daily., Disp: , Rfl:  .  Coenzyme Q10 (COQ10 PO), Take 1 tablet by mouth daily., Disp: , Rfl:  .  FLUoxetine (PROZAC) 40 MG capsule, TAKE 1 CAPSULE BY MOUTH EVERY MORNING, Disp: 90 capsule, Rfl: 3 .  glipiZIDE (GLUCOTROL) 10 MG tablet, Take 1 tablet (10 mg total) by mouth daily., Disp: 90 tablet, Rfl: 1 .  lamoTRIgine (LAMICTAL) 150 MG tablet, Take 1 tablet (150 mg total) by mouth 2 (two) times daily., Disp: 180 tablet, Rfl: 1 .  LORazepam (ATIVAN)  1 MG tablet, TAKE 1 TABLET BY MOUTH THREE TIMES DAILY AS NEEDED FOR ANXIETY. GENERIC EQUIVALENT FOR ATIVAN., Disp: 270 tablet, Rfl: 1 .  losartan (COZAAR) 100 MG tablet, TAKE 1 TABLET BY MOUTH DAILY., Disp: 90 tablet, Rfl: 2 .  metFORMIN (GLUCOPHAGE) 1000 MG tablet, TAKE 1 TABLET BY MOUTH 2 TIMES PER DAY, Disp: 60 tablet, Rfl: 0 .  metoprolol tartrate (LOPRESSOR) 25 MG tablet, TAKE 1 TABLET BY MOUTH 2 TIMES DAILY. GENERIC EQUIVALENT FOR LOPRESSOR., Disp: 180 tablet, Rfl: 2 .  Multiple Vitamin (MULTIVITAMIN WITH MINERALS) TABS tablet, Take 1 tablet by mouth daily., Disp: , Rfl:  .  omeprazole (PRILOSEC) 40 MG capsule, Take 40 mg by mouth daily., Disp: , Rfl:  .  ONE TOUCH ULTRA TEST test strip, USE 1 STRIP TO CHECK GLUCOSE THREE TIMES DAILY AS DIRECTED, Disp: 150 each, Rfl: 0 .  OVER THE COUNTER MEDICATION, Garlic [id number] mg  daily, Disp: , Rfl:    No Known Allergies   Review of Systems  Constitutional: Negative.   Respiratory: Negative.   Neurological: Negative for dizziness and headaches.  Psychiatric/Behavioral: Negative.  Today's Vitals   01/15/19 1524  BP: 140/90  Pulse: 90  Temp: (!) 97.4 F (36.3 C)  TempSrc: Oral  Weight: 227 lb 3.2 oz (103.1 kg)  Height: 5' 8.2" (1.732 m)  PainSc: 0-No pain   Body mass index is 34.34 kg/m.   Objective:  Physical Exam Constitutional:      Appearance: Normal appearance.  Cardiovascular:     Rate and Rhythm: Normal rate.     Pulses: Normal pulses.     Heart sounds: Normal heart sounds.  Pulmonary:     Effort: Pulmonary effort is normal.     Breath sounds: Normal breath sounds.  Skin:    General: Skin is warm and dry.     Capillary Refill: Capillary refill takes less than 2 seconds.  Neurological:     General: No focal deficit present.     Mental Status: He is alert and oriented to person, place, and time.  Psychiatric:        Mood and Affect: Mood normal.        Behavior: Behavior normal.        Thought Content: Thought  content normal.        Judgment: Judgment normal.         Assessment And Plan:     1. Need for influenza vaccination  Influenza vaccine given in office  Advised to take Tylenol as needed for muscle aches or fever - Flu vaccine HIGH DOSE PF (Fluzone High dose)  2. Type 2 diabetes mellitus without complication, without long-term current use of insulin (HCC)  Decrease metformin to 1000 mg once per day vs 2 times a day due to his chronic kidney disease  - metFORMIN (GLUCOPHAGE) 1000 MG tablet; Take 1 tablet (1,000 mg total) by mouth daily with breakfast.  Dispense: 90 tablet; Refill: 0  3. Stage 3 chronic kidney disease ([place])  Discussed with him the disease process of CKD  Will decrease his metformin and recheck in 3 months  Encouraged to drink plenty of water and avoid NSAIDs - metFORMIN (GLUCOPHAGE) 1000 MG tablet; Take 1 tablet (1,000 mg total) by mouth daily with breakfast.  Dispense: 90 tablet; Refill: 0    [name], FNP    THE PATIENT IS ENCOURAGED TO PRACTICE SOCIAL DISTANCING DUE TO THE COVID-19 PANDEMIC.

## 2019-01-16 ENCOUNTER — Other Ambulatory Visit: Payer: Self-pay

## 2019-01-16 DIAGNOSIS — N183 Chronic kidney disease, stage 3 unspecified: Secondary | ICD-10-CM

## 2019-01-16 DIAGNOSIS — E119 Type 2 diabetes mellitus without complications: Secondary | ICD-10-CM

## 2019-01-16 MED ORDER — METFORMIN HCL 1000 MG PO TABS: 1000.0000 mg | ORAL_TABLET | Freq: Every day | ORAL | 3 refills | Status: DC

## 2019-02-04 NOTE — Progress Notes (Signed)
Subjective:    Patient ID: Robert Walters, male    DOB: 1955-08-21, 63 y.o.   MRN: [id number]  HPI Robert Walters is a 63 year old male with a past medical history of diabetes mellitus type II, CVA, TIA, hypertension, bipolar, fatty liver, elevated LFTs and GERD.  He reports having a colonoscopy 2 years ago by Dr. Collene Mares,  9 polyps that were removed. He was advised to repeat a colonoscopy in 3 years.  I will request a copy of his colonoscopy procedure and biopsy reports for further review and to verify his colonoscopy recall date.  Father with history of colon cancer.    He underwent a cholecystectomy 9/44/9675 which was complicated by an infected gangrenous gallbladder.  He had a postoperative biliary leak.  He underwent an ERCP 09/18/2018 which confirmed a cystic duct leak and a sphincterotomy with a biliary stent was placed by Dr. Laural Golden.  A repeat ERCP with removal of the biliary stent was completed eight 07/2016.  He was seen in the office 12/26/2018 for further evaluation regarding elevated LFTs.   Labs 11/29/2018: AST 71.  ALT 60.  WBC 7.0.  Hemoglobin 12.8.  Hematocrit 37.6.  MCV 92.  Platelet 161. Labs 12/26/2018: AST 56.  ALT 53.  Total bili 0.7.  Alk phos 105.  Iron 75.  Iron saturation 22.  Ferritin 64.  Ceruloplasmin 34.  IgG 530.  Alpha-1 antitrypsin 181.  ANA negative.  Anti-smooth muscle antibody  < 20. Antimitochondrial antibody  < 20.  Hepatitis A antibody total nonreactive.  Hepatitis B surface antibody nonreactive.  Hepatitis B core total antibody nonreactive.    An abdominal ultrasound 12/28/2018 showed a fatty liver and renal cortex thinning.    He denies having any upper or lower abdominal pain.  His appetite is good.  No nausea.  No rectal bleeding or melena.  No alcohol use.  No complaints today.  He was advised to follow with his primary care physician regarding the kidney findings on his ultrasound.  Abdominal sonogram 12/28/2018: 1. Fatty infiltration of the liver. 2. No  acute findings. 3. No significant bile duct dilatation. 4. Bilateral renal cortex thinning suggesting some degree of chronic medical renal disease. 5. Status post cholecystectomy.  Hepatic Function Latest Ref Rng & Units 12/26/2018 11/29/2018 08/30/2018  Total Protein 6.1 - 8.1 g/dL 6.5 6.8 7.1  Albumin 3.8 - 4.8 g/dL - 4.8 4.6  AST 10 - 35 U/L 56(H) 71(H) 53(H)  ALT 9 - 46 U/L 53(H) 60(H) 58(H)  Alk Phosphatase 39 - 117 IU/L - 123(H) 121(H)  Total Bilirubin 0.2 - 1.2 mg/dL 0.7 0.7 0.6  Bilirubin, Direct 0.0 - 0.2 mg/dL 0.1 - -    Echo 07/27/2015: LV with mild concentric hypertrophy, normal systolic function, grade 1 diastolic dysfunction.  Review of Systems see HPI, all other systems reviewed and are negative     Objective:   Physical Exam  Blood pressure (!) 160/94, pulse 89, temperature 98.2 F (36.8 C), temperature source Oral, height '5\' 9"'  (1.753 m), weight 230 lb 12.8 oz (104.7 kg). General: 63 year old male well-developed in no acute distress Eyes: Sclera nonicteric, conjunctiva pink Heart: Regular rate and rhythm, no murmurs Lungs: Breath sounds clear throughout Abdomen: Protuberant obese abdomen with significant diastasis recti, no masses, no organomegaly appreciated, positive bowel sounds to all 4 quadrants, laparoscopic scars intact Extremities: No edema Neuro: Alert and oriented x4, no focal deficits     Assessment & Plan:   3.  63 year old male with a  history of fatty liver with elevated LFTs. Repeat LFTs trending downward -Repeat hepatic panel at time of his physical exam scheduled with his PCP November 2020 -Up in our office in 6 months -Weight loss discussed  -Discussed the risk of fatty liver disease and cirrhosis  2. History of colon polyps  -Request a copy of his colonoscopy with Dr. Verdia Kuba in [place]   3.  Significant diastasis recti with possible ventral hernia -I advised the patient to follow-up with his primary care physician to consider a referral  to see a general surgeon for further evaluation

## 2019-02-06 ENCOUNTER — Ambulatory Visit (INDEPENDENT_AMBULATORY_CARE_PROVIDER_SITE_OTHER): Payer: Medicare Other | Admitting: Nurse Practitioner

## 2019-02-06 ENCOUNTER — Encounter (INDEPENDENT_AMBULATORY_CARE_PROVIDER_SITE_OTHER): Payer: Self-pay | Admitting: Nurse Practitioner

## 2019-02-06 ENCOUNTER — Other Ambulatory Visit: Payer: Self-pay

## 2019-02-06 VITALS — BP 160/94 | HR 89 | Temp 98.2°F | Ht 69.0 in | Wt 230.8 lb

## 2019-02-06 DIAGNOSIS — K760 Fatty (change of) liver, not elsewhere classified: Secondary | ICD-10-CM | POA: Diagnosis not present

## 2019-02-06 NOTE — Patient Instructions (Signed)
1. Our office will send a request for copy of your colonoscopy done by Dr. Collene Mares in [place] in 2018  2.  Repeat labs to monitor your liver function in November at the time of your appointment with your primary care physician  3.  Discussed weight loss, exercise as tolerated  4.  Discussed your abdominal ultrasound showed fatty liver which means and time if you do not lose weight you could develop fatty liver disease which we are trying to prevent  5.  Follow-up in the office in 6 months and as needed

## 2019-02-15 ENCOUNTER — Telehealth: Payer: Self-pay

## 2019-02-15 NOTE — Telephone Encounter (Signed)
I called patient to see if he is taking his cholesterol medicine and to make sure he has enough. I have left pt v/m to call the office. YRL,RMA

## 2019-02-19 ENCOUNTER — Telehealth: Payer: Self-pay

## 2019-02-19 NOTE — Telephone Encounter (Signed)
Returned pt call, pt stated he is still taking his cholesterol medication, and he has plenty left

## 2019-02-22 ENCOUNTER — Telehealth (INDEPENDENT_AMBULATORY_CARE_PROVIDER_SITE_OTHER): Payer: Self-pay | Admitting: Nurse Practitioner

## 2019-02-22 NOTE — Telephone Encounter (Signed)
Ann pls enter colonoscopy recall date Sept 18, 2022. thx

## 2019-02-22 NOTE — Telephone Encounter (Signed)
Noted in recall 

## 2019-03-12 DIAGNOSIS — I739 Peripheral vascular disease, unspecified: Secondary | ICD-10-CM | POA: Diagnosis not present

## 2019-03-13 ENCOUNTER — Other Ambulatory Visit (INDEPENDENT_AMBULATORY_CARE_PROVIDER_SITE_OTHER): Payer: Self-pay | Admitting: Internal Medicine

## 2019-03-21 ENCOUNTER — Ambulatory Visit (INDEPENDENT_AMBULATORY_CARE_PROVIDER_SITE_OTHER): Payer: Medicare Other

## 2019-03-21 ENCOUNTER — Encounter: Payer: Self-pay | Admitting: Nurse Practitioner

## 2019-03-21 ENCOUNTER — Ambulatory Visit (INDEPENDENT_AMBULATORY_CARE_PROVIDER_SITE_OTHER): Payer: Medicare Other | Admitting: Nurse Practitioner

## 2019-03-21 ENCOUNTER — Other Ambulatory Visit: Payer: Self-pay

## 2019-03-21 VITALS — BP 120/78 | HR 85 | Temp 98.3°F | Ht 69.2 in | Wt 230.0 lb

## 2019-03-21 VITALS — BP 120/78 | HR 85 | Temp 98.3°F | Ht 69.2 in | Wt 230.2 lb

## 2019-03-21 DIAGNOSIS — Z0000 Encounter for general adult medical examination without abnormal findings: Secondary | ICD-10-CM

## 2019-03-21 DIAGNOSIS — H6123 Impacted cerumen, bilateral: Secondary | ICD-10-CM

## 2019-03-21 DIAGNOSIS — Z125 Encounter for screening for malignant neoplasm of prostate: Secondary | ICD-10-CM | POA: Diagnosis not present

## 2019-03-21 DIAGNOSIS — Z23 Encounter for immunization: Secondary | ICD-10-CM

## 2019-03-21 DIAGNOSIS — I129 Hypertensive chronic kidney disease with stage 1 through stage 4 chronic kidney disease, or unspecified chronic kidney disease: Secondary | ICD-10-CM

## 2019-03-21 DIAGNOSIS — I5032 Chronic diastolic (congestive) heart failure: Secondary | ICD-10-CM

## 2019-03-21 DIAGNOSIS — N183 Chronic kidney disease, stage 3 unspecified: Secondary | ICD-10-CM | POA: Diagnosis not present

## 2019-03-21 DIAGNOSIS — I130 Hypertensive heart and chronic kidney disease with heart failure and stage 1 through stage 4 chronic kidney disease, or unspecified chronic kidney disease: Secondary | ICD-10-CM

## 2019-03-21 DIAGNOSIS — E119 Type 2 diabetes mellitus without complications: Secondary | ICD-10-CM

## 2019-03-21 DIAGNOSIS — E1122 Type 2 diabetes mellitus with diabetic chronic kidney disease: Secondary | ICD-10-CM

## 2019-03-21 LAB — POCT URINALYSIS DIPSTICK
Blood, UA: NEGATIVE
Glucose, UA: NEGATIVE
Leukocytes, UA: NEGATIVE
Nitrite, UA: NEGATIVE
Protein, UA: POSITIVE — AB
Spec Grav, UA: >=1.03 — AB (ref 1.010–1.025)
Urobilinogen, UA: 1 E.U./dL
pH, UA: 5 (ref 5.0–8.0)

## 2019-03-21 LAB — POCT UA - MICROALBUMIN
Creatinine, POC: 300 mg/dL
Microalbumin Ur, POC: 80 mg/L

## 2019-03-21 MED ORDER — ZOSTER VAC RECOMB ADJUVANTED 50 MCG/0.5ML IM SUSR: 0.5000 mL | Freq: Once | INTRAMUSCULAR | 0 refills | Status: AC

## 2019-03-21 MED ORDER — PNEUMOCOCCAL 13-VAL CONJ VACC IM SUSP: 0.5000 mL | INTRAMUSCULAR | 0 refills | Status: AC

## 2019-03-21 MED ORDER — TETANUS-DIPHTH-ACELL PERTUSSIS 5-2.5-18.5 LF-MCG/0.5 IM SUSP: 0.5000 mL | Freq: Once | INTRAMUSCULAR | 0 refills | Status: AC

## 2019-03-21 MED ORDER — GLIPIZIDE 10 MG PO TABS: 10.0000 mg | ORAL_TABLET | Freq: Every day | ORAL | 1 refills | Status: DC

## 2019-03-21 NOTE — Progress Notes (Signed)
Subjective:   Robert Walters is a 63 y.o. male who presents for Medicare Annual/Subsequent preventive examination.  Review of Systems:  n/a Cardiac Risk Factors include: advanced age (>32men, >19 women);diabetes mellitus;hypertension;male gender;obesity (BMI >30kg/m2)     Objective:    Vitals: BP 120/78 (BP Location: Left Arm, Patient Position: Sitting)   Pulse 85   Temp 98.3 F (36.8 C) (Oral)   Ht 5' 9.2" (1.758 m)   Wt 230 lb (104.3 kg)   BMI 33.77 kg/m   Body mass index is 33.77 kg/m.  Advanced Directives 03/21/2019 12/03/2016 09/17/2016 09/01/2016 08/23/2016 08/23/2016 08/04/2015  Does Patient Have a Medical Advance Directive? No No No No No No No  Would patient like information on creating a medical advance directive? - No - Patient declined No - Patient declined No - Patient declined No - Patient declined No - Patient declined No - patient declined information  Pre-existing out of facility DNR order (yellow form or pink MOST form) - - - - - - -    Tobacco Social History   Tobacco Use  Smoking Status Never Smoker  Smokeless Tobacco Never Used     Counseling given: Not Answered   Clinical Intake:  Pre-visit preparation completed: Yes  Pain : No/denies pain     Nutritional Status: BMI > 30  Obese Nutritional Risks: None Diabetes: Yes CBG done?: No Did pt. bring in CBG monitor from home?: No  How often do you need to have someone help you when you read instructions, pamphlets, or other written materials from your doctor or pharmacy?: 1 - Never What is the last grade level you completed in school?: 12th grade  Interpreter Needed?: No  Information entered by :: NAllen LPN  Past Medical History:  Diagnosis Date  . Acid reflux disease   . Bipolar 1 disorder, mixed ([place])   . BPH (benign prostatic hypertrophy)   . CVA (cerebral vascular accident) ([place]) 07/2015  . Gastritis   . H/O hiatal hernia   . Hyperlipidemia   . Hypertension   . Normal nuclear stress  test Dec 2011  . Obesity   . TIA (transient ischemic attack) 05/15/2012   "they think I've had one this am @ 0130" (05/15/2012)  . Type II diabetes mellitus ([place])   . Vertigo    Past Surgical History:  Procedure Laterality Date  . BILIARY STENT PLACEMENT N/A 09/17/2016   Procedure: BILIARY STENT PLACEMENT;  Surgeon: [name], MD;  [facility];  Service: Endoscopy;  Laterality: N/A;  . CARDIOVASCULAR STRESS TEST  2009   EF 63%  . CHOLECYSTECTOMY N/A 08/25/2016   Procedure: LAPAROSCOPIC CHOLECYSTECTOMY;  Surgeon: [name], MD;  Location: [facility];  Service: General;  Laterality: N/A;  . COLONOSCOPY    . ERCP N/A 09/17/2016   Procedure: ENDOSCOPIC RETROGRADE CHOLANGIOPANCREATOGRAPHY (ERCP);  Surgeon: [name], MD;  [facility];  Service: Endoscopy;  Laterality: N/A;  730  . ERCP N/A 12/03/2016   Procedure: ENDOSCOPIC RETROGRADE CHOLANGIOPANCREATOGRAPHY (ERCP);  Surgeon: [name], MD;  [facility];  Service: Gastroenterology;  Laterality: N/A;  . ESOPHAGOGASTRODUODENOSCOPY    . GASTROINTESTINAL STENT REMOVAL N/A 12/03/2016   Procedure: BILIARY STENT REMOVAL;  Surgeon: [name], MD;  [facility];  Service: Gastroenterology;  Laterality: N/A;  . NO PAST SURGERIES    . SPHINCTEROTOMY N/A 09/17/2016   Procedure: SPHINCTEROTOMY;  Surgeon: [name], MD;  [facility];  Service:  Endoscopy;  Laterality: N/A;   Family History  Problem Relation Age of Onset  . Hyperlipidemia Father        in a nursing home  . Colon cancer Father   . Stroke Mother 53   Social History   Socioeconomic History  . Marital status: Widowed    Spouse name: Not on file  . Number of children: Not on file  . Years of education: Not on file  . Highest education level: Not on file  Occupational History  . Occupation: disabled  Social Needs  . Financial resource strain: Not hard at all  . Food insecurity    Worry: Never true     Inability: Never true  . Transportation needs    Medical: No    Non-medical: No  Tobacco Use  . Smoking status: Never Smoker  . Smokeless tobacco: Never Used  Substance and Sexual Activity  . Alcohol use: No  . Drug use: No  . Sexual activity: Not Currently  Lifestyle  . Physical activity    Days per week: 3 days    Minutes per session: 30 min  . Stress: Not at all  Relationships  . Social Herbalist on phone: Not on file    Gets together: Not on file    Attends religious service: Not on file    Active member of club or organization: Not on file    Attends meetings of clubs or organizations: Not on file    Relationship status: Not on file  Other Topics Concern  . Not on file  Social History Narrative  . Not on file    Outpatient Encounter Medications as of 03/21/2019  Medication Sig  . alfuzosin (UROXATRAL) 10 MG 24 hr tablet Take 10 mg by mouth daily.   [name] aspirin EC 81 MG tablet Take 1 tablet (81 mg total) by mouth daily.  [name] atorvastatin (LIPITOR) 40 MG tablet TAKE 1 TABLET BY MOUTH AT BEDTIME  . b complex vitamins tablet Take 1 tablet by mouth daily.  . Cholecalciferol (VITAMIN D) 2000 UNITS CAPS Take 1 capsule by mouth 3 (three) times daily.   [name] CINNAMON PO Take 1 tablet by mouth daily.  . Coenzyme Q10 (COQ10 PO) Take 1 tablet by mouth daily.  [name] FLUoxetine (PROZAC) 40 MG capsule TAKE 1 CAPSULE BY MOUTH EVERY MORNING  . glipiZIDE (GLUCOTROL) 10 MG tablet Take 1 tablet (10 mg total) by mouth daily.  [name] lamoTRIgine (LAMICTAL) 150 MG tablet Take 1 tablet (150 mg total) by mouth 2 (two) times daily.  [name] LORazepam (ATIVAN) 1 MG tablet TAKE 1 TABLET BY MOUTH THREE TIMES DAILY AS NEEDED FOR ANXIETY. GENERIC EQUIVALENT FOR ATIVAN.  [name] losartan (COZAAR) 100 MG tablet TAKE 1 TABLET BY MOUTH DAILY.  . metFORMIN (GLUCOPHAGE) 1000 MG tablet Take 1 tablet (1,000 mg total) by mouth daily with breakfast.  . metoprolol tartrate (LOPRESSOR) 25 MG tablet TAKE 1 TABLET BY MOUTH  2 TIMES DAILY. GENERIC EQUIVALENT FOR LOPRESSOR.  . Multiple Vitamin (MULTIVITAMIN WITH MINERALS) TABS tablet Take 1 tablet by mouth daily.  [name] omeprazole (PRILOSEC) 40 MG capsule TAKE 1 CAPSULE BY MOUTH 20 MINUTES BEFORE BREAKFAST.  . ONE TOUCH ULTRA TEST test strip USE 1 STRIP TO CHECK GLUCOSE THREE TIMES DAILY AS DIRECTED (Patient not taking: Reported on 03/21/2019)  . OVER THE COUNTER MEDICATION Garlic [id number] mg  daily   No facility-administered encounter medications on file as of 03/21/2019.     Activities of Daily  Living In your present state of health, do you have any difficulty performing the following activities: 03/21/2019 03/21/2019  Hearing? N N  Vision? N N  Difficulty concentrating or making decisions? N Y  Walking or climbing stairs? N N  Dressing or bathing? N N  Doing errands, shopping? N N  Preparing Food and eating ? N -  Using the Toilet? N -  In the past six months, have you accidently leaked urine? Y -  Do you have problems with loss of bowel control? N -  Managing your Medications? N -  Managing your Finances? N -  Housekeeping or managing your Housekeeping? N -  Some recent data might be hidden    Patient Care Team: [name], FNP as PCP - General ([place]) Nahser, Wonda Cheng, MD as PCP - Cardiology (Cardiology)   Assessment:   This is a routine wellness examination for Zenaido.  Exercise Activities and Dietary recommendations Current Exercise Habits: Home exercise routine, Type of exercise: walking, Time (Minutes): 30, Frequency (Times/Week): 4, Weekly Exercise (Minutes/Week): 120  Goals    . Patient Stated     03/21/2019, want to eat healthy       Fall Risk Fall Risk  03/21/2019 03/21/2019 01/15/2019 11/29/2018 11/29/2018  Falls in the past year? 1 1 0 1 1  Comment got feet crossed up, tripped over something - - - -  Number falls in past yr: 1 0 - 0 0  Injury with Fall? 0 0 - 0 0  Risk for fall due to : Medication side effect;History of  fall(s) - - - -  Follow up Falls evaluation completed;Education provided;Falls prevention discussed - - - -   Is the patient's home free of loose throw rugs in walkways, pet beds, electrical cords, etc?   yes      Grab bars in the bathroom? yes      Handrails on the stairs?   n/a      Adequate lighting?   yes  Timed Get Up and Go Performed: n/a  Depression Screen PHQ 2/9 Scores 03/21/2019 03/21/2019 03/21/2019 01/15/2019  PHQ - 2 Score 1 0 0 0  PHQ- 9 Score 2 0 - -    Cognitive Function     6CIT Screen 03/21/2019  What Year? 0 points  What month? 0 points  What time? 0 points  Count back from 20 0 points  Months in reverse 0 points  Repeat phrase 8 points  Total Score 8    Immunization History  Administered Date(s) Administered  . DTaP 10/05/2011  . Influenza, High Dose Seasonal PF 01/15/2019  . Influenza-Unspecified 03/03/2012, 03/16/2018  . Pneumococcal Polysaccharide-23 05/16/2012    Qualifies for Shingles Vaccine? yes  Screening Tests Health Maintenance  Topic Date Due  . TETANUS/TDAP  12/31/1972  . PNA vac Low Risk Adult (1 of 2 - PCV13) 01/01/2019  . HEMOGLOBIN A1C  06/01/2019  . OPHTHALMOLOGY EXAM  10/19/2019  . FOOT EXAM  11/29/2019  . COLONOSCOPY  01/19/2028  . INFLUENZA VACCINE  Completed  . Hepatitis C Screening  Completed  . HIV Screening  Completed   Cancer Screenings: Lung: Low Dose CT Chest recommended if Age 57-80 years, 30 pack-year currently smoking OR have quit w/in 15years. Patient does not qualify. Colorectal: up to date  Additional Screenings:  Hepatitis C Screening:03/2018      Plan:    Patient states that he wants to start eating healthier. Tends to eat more fast food.  I have  personally reviewed and noted the following in the patient's chart:   . Medical and social history . Use of alcohol, tobacco or illicit drugs  . Current medications and supplements . Functional ability and status . Nutritional status . Physical  activity . Advanced directives . List of other physicians . Hospitalizations, surgeries, and ER visits in previous 12 months . Vitals . Screenings to include cognitive, depression, and falls . Referrals and appointments  In addition, I have reviewed and discussed with patient certain preventive protocols, quality metrics, and best practice recommendations. A written personalized care plan for preventive services as well as general preventive health recommendations were provided to patient.     Kellie Simmering, LPN  [phone]

## 2019-03-21 NOTE — Patient Instructions (Signed)
Robert Walters , Thank you for taking time to come for your Medicare Wellness Visit. I appreciate your ongoing commitment to your health goals. Please review the following plan we discussed and let me know if I can assist you in the future.   Screening recommendations/referrals: Colonoscopy: 01/2018 Recommended yearly ophthalmology/optometry visit for glaucoma screening and checkup Recommended yearly dental visit for hygiene and checkup  Vaccinations: Influenza vaccine: 01/2019 Pneumococcal vaccine: 12/2016 Tdap vaccine: sent to pharmacy Shingles vaccine: discussed    Advanced directives: Advance directive discussed with you today. Even though you declined this today please call our office should you change your mind and we can give you the proper paperwork for you to fill out.   Conditions/risks identified: obesity  Next appointment:   Preventive Care 18 Years and Older, Male Preventive care refers to lifestyle choices and visits with your health care provider that can promote health and wellness. What does preventive care include?  A yearly physical exam. This is also called an annual well check.  Dental exams once or twice a year.  Routine eye exams. Ask your health care provider how often you should have your eyes checked.  Personal lifestyle choices, including:  Daily care of your teeth and gums.  Regular physical activity.  Eating a healthy diet.  Avoiding tobacco and drug use.  Limiting alcohol use.  Practicing safe sex.  Taking low doses of aspirin every day.  Taking vitamin and mineral supplements as recommended by your health care provider. What happens during an annual well check? The services and screenings done by your health care provider during your annual well check will depend on your age, overall health, lifestyle risk factors, and family history of disease. Counseling  Your health care provider may ask you questions about your:  Alcohol use.  Tobacco  use.  Drug use.  Emotional well-being.  Home and relationship well-being.  Sexual activity.  Eating habits.  History of falls.  Memory and ability to understand (cognition).  Work and work Statistician. Screening  You may have the following tests or measurements:  Height, weight, and BMI.  Blood pressure.  Lipid and cholesterol levels. These may be checked every 5 years, or more frequently if you are over 31 years old.  Skin check.  Lung cancer screening. You may have this screening every year starting at age 69 if you have a 30-pack-year history of smoking and currently smoke or have quit within the past 15 years.  Fecal occult blood test (FOBT) of the stool. You may have this test every year starting at age 42.  Flexible sigmoidoscopy or colonoscopy. You may have a sigmoidoscopy every 5 years or a colonoscopy every 10 years starting at age 52.  Prostate cancer screening. Recommendations will vary depending on your family history and other risks.  Hepatitis C blood test.  Hepatitis B blood test.  Sexually transmitted disease (STD) testing.  Diabetes screening. This is done by checking your blood sugar (glucose) after you have not eaten for a while (fasting). You may have this done every 1-3 years.  Abdominal aortic aneurysm (AAA) screening. You may need this if you are a current or former smoker.  Osteoporosis. You may be screened starting at age 73 if you are at high risk. Talk with your health care provider about your test results, treatment options, and if necessary, the need for more tests. Vaccines  Your health care provider may recommend certain vaccines, such as:  Influenza vaccine. This is recommended every year.  Tetanus, diphtheria, and acellular pertussis (Tdap, Td) vaccine. You may need a Td booster every 10 years.  Zoster vaccine. You may need this after age 11.  Pneumococcal 13-valent conjugate (PCV13) vaccine. One dose is recommended after age  6.  Pneumococcal polysaccharide (PPSV23) vaccine. One dose is recommended after age 69. Talk to your health care provider about which screenings and vaccines you need and how often you need them. This information is not intended to replace advice given to you by your health care provider. Make sure you discuss any questions you have with your health care provider. Document Released: 05/16/2015 Document Revised: 01/07/2016 Document Reviewed: 02/18/2015 Elsevier Interactive Patient Education  2017 [place] Prevention in the Home Falls can cause injuries. They can happen to people of all ages. There are many things you can do to make your home safe and to help prevent falls. What can I do on the outside of my home?  Regularly fix the edges of walkways and driveways and fix any cracks.  Remove anything that might make you trip as you walk through a door, such as a raised step or threshold.  Trim any bushes or trees on the path to your home.  Use bright outdoor lighting.  Clear any walking paths of anything that might make someone trip, such as rocks or tools.  Regularly check to see if handrails are loose or broken. Make sure that both sides of any steps have handrails.  Any raised decks and porches should have guardrails on the edges.  Have any leaves, snow, or ice cleared regularly.  Use sand or salt on walking paths during winter.  Clean up any spills in your garage right away. This includes oil or grease spills. What can I do in the bathroom?  Use night lights.  Install grab bars by the toilet and in the tub and shower. Do not use towel bars as grab bars.  Use non-skid mats or decals in the tub or shower.  If you need to sit down in the shower, use a plastic, non-slip stool.  Keep the floor dry. Clean up any water that spills on the floor as soon as it happens.  Remove soap buildup in the tub or shower regularly.  Attach bath mats securely with double-sided  non-slip rug tape.  Do not have throw rugs and other things on the floor that can make you trip. What can I do in the bedroom?  Use night lights.  Make sure that you have a light by your bed that is easy to reach.  Do not use any sheets or blankets that are too big for your bed. They should not hang down onto the floor.  Have a firm chair that has side arms. You can use this for support while you get dressed.  Do not have throw rugs and other things on the floor that can make you trip. What can I do in the kitchen?  Clean up any spills right away.  Avoid walking on wet floors.  Keep items that you use a lot in easy-to-reach places.  If you need to reach something above you, use a strong step stool that has a grab bar.  Keep electrical cords out of the way.  Do not use floor polish or wax that makes floors slippery. If you must use wax, use non-skid floor wax.  Do not have throw rugs and other things on the floor that can make you trip. What can I do  with my stairs?  Do not leave any items on the stairs.  Make sure that there are handrails on both sides of the stairs and use them. Fix handrails that are broken or loose. Make sure that handrails are as long as the stairways.  Check any carpeting to make sure that it is firmly attached to the stairs. Fix any carpet that is loose or worn.  Avoid having throw rugs at the top or bottom of the stairs. If you do have throw rugs, attach them to the floor with carpet tape.  Make sure that you have a light switch at the top of the stairs and the bottom of the stairs. If you do not have them, ask someone to add them for you. What else can I do to help prevent falls?  Wear shoes that:  Do not have high heels.  Have rubber bottoms.  Are comfortable and fit you well.  Are closed at the toe. Do not wear sandals.  If you use a stepladder:  Make sure that it is fully opened. Do not climb a closed stepladder.  Make sure that both  sides of the stepladder are locked into place.  Ask someone to hold it for you, if possible.  Clearly mark and make sure that you can see:  Any grab bars or handrails.  First and last steps.  Where the edge of each step is.  Use tools that help you move around (mobility aids) if they are needed. These include:  Canes.  Walkers.  Scooters.  Crutches.  Turn on the lights when you go into a dark area. Replace any light bulbs as soon as they burn out.  Set up your furniture so you have a clear path. Avoid moving your furniture around.  If any of your floors are uneven, fix them.  If there are any pets around you, be aware of where they are.  Review your medicines with your doctor. Some medicines can make you feel dizzy. This can increase your chance of falling. Ask your doctor what other things that you can do to help prevent falls. This information is not intended to replace advice given to you by your health care provider. Make sure you discuss any questions you have with your health care provider. Document Released: 02/13/2009 Document Revised: 09/25/2015 Document Reviewed: 05/24/2014 Elsevier Interactive Patient Education  2017 [employer].

## 2019-03-21 NOTE — Patient Instructions (Signed)

## 2019-03-21 NOTE — Progress Notes (Signed)
Men's preventive visit.   Patient Health Questionnaire (PHQ-2) is    Office Visit from 03/21/2019 in [facility]  PHQ-2 Total Score  0     Patient is on a Regular diet. Marital status: Widowed. Relevant history for alcohol use is:  Social History   Substance and Sexual Activity  Alcohol Use No   Relevant history for tobacco use is:  Social History   Tobacco Use  Smoking Status Never Smoker  Smokeless Tobacco Never Used   Exercises 30 minutes per day 3 days a week.  Subjective:    Patient ID: Robert Walters , male    DOB: [birth year] , 63 y.o.   MRN: [id number]   Chief Complaint  Patient presents with  . Annual Exam    HPI  Hypertension This is a chronic problem. The current episode started more than 1 year ago. The problem is unchanged. The problem is controlled. Pertinent negatives include no anxiety, blurred vision or headaches. There are no associated agents to hypertension. Risk factors for coronary artery disease include obesity, sedentary lifestyle and male gender. Past treatments include angiotensin blockers and beta blockers. The current treatment provides no improvement. There are no compliance problems.  There is no history of angina or kidney disease. There is no history of chronic renal disease.  Diabetes He presents for his follow-up diabetic visit. He has type 2 diabetes mellitus. His disease course has been stable. Pertinent negatives for hypoglycemia include no confusion, dizziness or headaches. Pertinent negatives for diabetes include no blurred vision, no fatigue, no polydipsia, no polyphagia and no polyuria. There are no hypoglycemic complications. Symptoms are stable. There are no diabetic complications. Risk factors for coronary artery disease include diabetes mellitus, hypertension, male sex, obesity and sedentary lifestyle. His weight is stable. He has not had a previous visit with a dietitian. He never participates in exercise. Home  blood sugar record trend: Not checking his blood sugar regularly. An ACE inhibitor/angiotensin II receptor blocker is being taken. He does not see a podiatrist.Eye exam is current (He has scheduled an appt in the next 2-3 weeks.).     Past Medical History:  Diagnosis Date  . Acid reflux disease   . Bipolar 1 disorder, mixed ([place])   . BPH (benign prostatic hypertrophy)   . CVA (cerebral vascular accident) ([place]) 07/2015  . Gastritis   . H/O hiatal hernia   . Hyperlipidemia   . Hypertension   . Normal nuclear stress test Dec 2011  . Obesity   . TIA (transient ischemic attack) 05/15/2012   "they think I've had one this am @ 0130" (05/15/2012)  . Type II diabetes mellitus ([place])   . Vertigo      Family History  Problem Relation Age of Onset  . Hyperlipidemia Father        in a nursing home  . Colon cancer Father   . Stroke Mother 43     Current Outpatient Medications:  .  alfuzosin (UROXATRAL) 10 MG 24 hr tablet, Take 10 mg by mouth daily. , Disp: , Rfl:  .  aspirin EC 81 MG tablet, Take 1 tablet (81 mg total) by mouth daily., Disp: , Rfl:  .  atorvastatin (LIPITOR) 40 MG tablet, TAKE 1 TABLET BY MOUTH AT BEDTIME, Disp: 90 tablet, Rfl: 0 .  b complex vitamins tablet, Take 1 tablet by mouth daily., Disp: , Rfl:  .  Cholecalciferol (VITAMIN D) 2000 UNITS CAPS, Take 1 capsule by mouth 3 (three) times daily. ,  Disp: , Rfl:  .  CINNAMON PO, Take 1 tablet by mouth daily., Disp: , Rfl:  .  Coenzyme Q10 (COQ10 PO), Take 1 tablet by mouth daily., Disp: , Rfl:  .  FLUoxetine (PROZAC) 40 MG capsule, TAKE 1 CAPSULE BY MOUTH EVERY MORNING, Disp: 90 capsule, Rfl: 3 .  glipiZIDE (GLUCOTROL) 10 MG tablet, Take 1 tablet (10 mg total) by mouth daily., Disp: 90 tablet, Rfl: 1 .  lamoTRIgine (LAMICTAL) 150 MG tablet, Take 1 tablet (150 mg total) by mouth 2 (two) times daily., Disp: 180 tablet, Rfl: 1 .  LORazepam (ATIVAN) 1 MG tablet, TAKE 1 TABLET BY MOUTH THREE TIMES DAILY AS NEEDED FOR ANXIETY.  GENERIC EQUIVALENT FOR ATIVAN., Disp: 270 tablet, Rfl: 1 .  losartan (COZAAR) 100 MG tablet, TAKE 1 TABLET BY MOUTH DAILY., Disp: 90 tablet, Rfl: 2 .  metFORMIN (GLUCOPHAGE) 1000 MG tablet, Take 1 tablet (1,000 mg total) by mouth daily with breakfast., Disp: 90 tablet, Rfl: 3 .  metoprolol tartrate (LOPRESSOR) 25 MG tablet, TAKE 1 TABLET BY MOUTH 2 TIMES DAILY. GENERIC EQUIVALENT FOR LOPRESSOR., Disp: 180 tablet, Rfl: 2 .  Multiple Vitamin (MULTIVITAMIN WITH MINERALS) TABS tablet, Take 1 tablet by mouth daily., Disp: , Rfl:  .  omeprazole (PRILOSEC) 40 MG capsule, TAKE 1 CAPSULE BY MOUTH 20 MINUTES BEFORE BREAKFAST., Disp: 90 capsule, Rfl: 0 .  OVER THE COUNTER MEDICATION, Garlic [id number] mg  daily, Disp: , Rfl:  .  ONE TOUCH ULTRA TEST test strip, USE 1 STRIP TO CHECK GLUCOSE THREE TIMES DAILY AS DIRECTED (Patient not taking: Reported on 03/21/2019), Disp: 150 each, Rfl: 0   No Known Allergies   Review of Systems  Constitutional: Negative.  Negative for fatigue.  HENT: Negative.   Eyes: Negative.  Negative for blurred vision.  Respiratory: Negative.   Cardiovascular: Negative.   Gastrointestinal: Negative.   Endocrine: Negative.  Negative for polydipsia, polyphagia and polyuria.  Genitourinary: Negative.   Musculoskeletal: Negative.   Skin: Negative.   Allergic/Immunologic: Negative.   Neurological: Negative.  Negative for dizziness and headaches.  Hematological: Negative.   Psychiatric/Behavioral: Negative.  Negative for confusion.     Today's Vitals   03/21/19 0918  BP: 120/78  Pulse: 85  Temp: 98.3 F (36.8 C)  TempSrc: Oral  Weight: 230 lb 3.2 oz (104.4 kg)  Height: 5' 9.2" (1.758 m)  PainSc: 0-No pain   Body mass index is 33.8 kg/m.   Objective:  Physical Exam Constitutional:      General: He is not in acute distress.    Appearance: Normal appearance. He is well-developed. He is obese.  HENT:     Head: Normocephalic and atraumatic.     Right Ear: Tympanic membrane  and external ear normal. There is impacted cerumen.     Left Ear: Tympanic membrane and external ear normal. There is impacted cerumen.     Ears:     Comments: Unable to visualize TM's bilaterally    Nose: Nose normal.  Eyes:     Extraocular Movements: Extraocular movements intact.     Conjunctiva/sclera: Conjunctivae normal.     Pupils: Pupils are equal, round, and reactive to light.  Neck:     Musculoskeletal: Full passive range of motion without pain, normal range of motion and neck supple. No edema or muscular tenderness.     Thyroid: No thyromegaly.     Vascular: No carotid bruit.  Cardiovascular:     Rate and Rhythm: Normal rate and regular rhythm.  Pulses: Normal pulses.     Heart sounds: Normal heart sounds, S1 normal and S2 normal. No murmur.  Pulmonary:     Effort: Pulmonary effort is normal.     Breath sounds: Normal breath sounds.  Abdominal:     General: Bowel sounds are normal.     Palpations: Abdomen is soft.  Musculoskeletal: Normal range of motion.  Skin:    General: Skin is warm and dry.     Capillary Refill: Capillary refill takes less than 2 seconds.  Neurological:     General: No focal deficit present.     Mental Status: He is alert and oriented to person, place, and time.  Psychiatric:        Mood and Affect: Mood normal.        Speech: Speech normal.        Behavior: Behavior normal.        Thought Content: Thought content normal.        Judgment: Judgment normal.         Assessment And Plan:     1. Health maintenance examination . Behavior modifications discussed and diet history reviewed.   . Pt will continue to exercise regularly and modify diet with low GI, plant based foods and decrease intake of processed foods.  . Recommend intake of daily multivitamin, Vitamin D, and calcium.  . Recommend for preventive screenings, as well as recommend immunizations that include influenza (up to date), TDAP, and Shingles  2. Encounter for prostate  cancer screening   3. Type 2 diabetes mellitus without complication, without long-term current use of insulin (HCC)  Chronic, controlled  Continue with current medications  Urine micro albumin done in August 2019  4. Essential hypertension  Chronic, controlled  Continue with current medications  EKG done, he is to schedule an appt with his Cardiologist - POCT Urinalysis Dipstick (81002) - POCT UA - Microalbumin - EKG 12-Lead    5. Type 2 diabetes mellitus without complication, without long-term current use of insulin (HCC)  Chronic, stable  Continue with medications  Increase physical activity as tolerated with a goal of 30 minutes a day 5 days a week.  - glipiZIDE (GLUCOTROL) 10 MG tablet; Take 1 tablet (10 mg total) by mouth daily.  Dispense: 90 tablet; Refill: 1  6. Bilateral impacted cerumen  Successful ear lavage  - Ear Lavage  7. Chronic diastolic heart failure (HCC)  Stable, being followed by [name]    [name], FNP

## 2019-03-22 LAB — PSA: Prostate Specific Ag, Serum: 0.4 ng/mL (ref 0.0–4.0)

## 2019-03-27 DIAGNOSIS — I5032 Chronic diastolic (congestive) heart failure: Secondary | ICD-10-CM | POA: Insufficient documentation

## 2019-04-04 ENCOUNTER — Other Ambulatory Visit: Payer: Self-pay

## 2019-04-04 MED ORDER — ATORVASTATIN CALCIUM 40 MG PO TABS: 40.0000 mg | ORAL_TABLET | Freq: Every day | ORAL | 1 refills | Status: DC

## 2019-04-06 ENCOUNTER — Other Ambulatory Visit: Payer: Self-pay | Admitting: Nurse Practitioner

## 2019-04-06 ENCOUNTER — Encounter: Payer: Self-pay | Admitting: Nurse Practitioner

## 2019-04-06 DIAGNOSIS — E119 Type 2 diabetes mellitus without complications: Secondary | ICD-10-CM

## 2019-04-06 DIAGNOSIS — I10 Essential (primary) hypertension: Secondary | ICD-10-CM

## 2019-04-10 ENCOUNTER — Other Ambulatory Visit: Payer: Medicare Other

## 2019-04-10 ENCOUNTER — Other Ambulatory Visit: Payer: Self-pay

## 2019-04-10 ENCOUNTER — Other Ambulatory Visit: Payer: Self-pay | Admitting: Nurse Practitioner

## 2019-04-10 DIAGNOSIS — E119 Type 2 diabetes mellitus without complications: Secondary | ICD-10-CM | POA: Diagnosis not present

## 2019-04-11 LAB — CMP14+EGFR
ALT: 56 IU/L — ABNORMAL HIGH (ref 0–44)
AST: 51 IU/L — ABNORMAL HIGH (ref 0–40)
Albumin/Globulin Ratio: 1.9 (ref 1.2–2.2)
Albumin: 4.3 g/dL (ref 3.8–4.8)
Alkaline Phosphatase: 145 IU/L — ABNORMAL HIGH (ref 39–117)
BUN/Creatinine Ratio: 14 (ref 10–24)
BUN: 18 mg/dL (ref 8–27)
Bilirubin Total: 0.4 mg/dL (ref 0.0–1.2)
CO2: 20 mmol/L (ref 20–29)
Calcium: 9.7 mg/dL (ref 8.6–10.2)
Chloride: 99 mmol/L (ref 96–106)
Creatinine, Ser: 1.33 mg/dL — ABNORMAL HIGH (ref 0.76–1.27)
GFR calc Af Amer: 64 mL/min/{1.73_m2} (ref >59)
GFR calc non Af Amer: 56 mL/min/{1.73_m2} — ABNORMAL LOW (ref >59)
Globulin, Total: 2.3 g/dL (ref 1.5–4.5)
Glucose: 240 mg/dL — ABNORMAL HIGH (ref 65–99)
Potassium: 5.1 mmol/L (ref 3.5–5.2)
Sodium: 138 mmol/L (ref 134–144)
Total Protein: 6.6 g/dL (ref 6.0–8.5)

## 2019-04-11 LAB — HEMOGLOBIN A1C
Est. average glucose Bld gHb Est-mCnc: 197 mg/dL
Hgb A1c MFr Bld: 8.5 % — ABNORMAL HIGH (ref 4.8–5.6)

## 2019-05-08 ENCOUNTER — Ambulatory Visit: Payer: Medicare Other | Admitting: Physician Assistant

## 2019-05-28 DIAGNOSIS — I739 Peripheral vascular disease, unspecified: Secondary | ICD-10-CM | POA: Diagnosis not present

## 2019-05-29 ENCOUNTER — Other Ambulatory Visit (INDEPENDENT_AMBULATORY_CARE_PROVIDER_SITE_OTHER): Payer: Self-pay | Admitting: Internal Medicine

## 2019-05-31 ENCOUNTER — Other Ambulatory Visit: Payer: Self-pay | Admitting: Urology

## 2019-05-31 DIAGNOSIS — N401 Enlarged prostate with lower urinary tract symptoms: Secondary | ICD-10-CM

## 2019-05-31 MED ORDER — ALFUZOSIN HCL ER 10 MG PO TB24: 10.0000 mg | ORAL_TABLET | Freq: Every day | ORAL | 2 refills | Status: DC

## 2019-05-31 NOTE — Telephone Encounter (Signed)
Patient requests refill on alfuzosin. He states [place] sent refill request and we did not Ok the refills. He was last seen 07/25/2018. He request a nurse call him back at [phone]

## 2019-05-31 NOTE — Telephone Encounter (Signed)
Alfuzosin 10mg  refill sent in. Pt scheduled for appt as well.

## 2019-06-08 ENCOUNTER — Ambulatory Visit (INDEPENDENT_AMBULATORY_CARE_PROVIDER_SITE_OTHER): Payer: Medicare Other | Admitting: Physician Assistant

## 2019-06-08 ENCOUNTER — Encounter: Payer: Self-pay | Admitting: Physician Assistant

## 2019-06-08 DIAGNOSIS — F411 Generalized anxiety disorder: Secondary | ICD-10-CM

## 2019-06-08 DIAGNOSIS — F3341 Major depressive disorder, recurrent, in partial remission: Secondary | ICD-10-CM | POA: Diagnosis not present

## 2019-06-08 MED ORDER — LAMOTRIGINE 150 MG PO TABS: 150.0000 mg | ORAL_TABLET | Freq: Two times a day (BID) | ORAL | 1 refills | Status: DC

## 2019-06-08 MED ORDER — FLUOXETINE HCL 40 MG PO CAPS: 40.0000 mg | ORAL_CAPSULE | Freq: Every morning | ORAL | 1 refills | Status: DC

## 2019-06-08 MED ORDER — LORAZEPAM 1 MG PO TABS: ORAL_TABLET | ORAL | 1 refills | Status: DC

## 2019-06-08 NOTE — Progress Notes (Signed)
[facility]  Patient ID: Robert Walters,  MRN: [id number]  PCP: [name], FNP  Date of Evaluation: 06/08/2019 Time spent:20 minutes  Chief Complaint:  Chief Complaint    Follow-up; Depression     Virtual Visit via Telephone Note  I connected with patient by a video enabled telemedicine application or telephone, with their informed consent, and verified patient privacy and that I am speaking with the correct person using two identifiers.  I am private, in my office and the patient is home.   I discussed the limitations, risks, security and privacy concerns of performing an evaluation and management service by telephone and the availability of in person appointments. I also discussed with the patient that there may be a patient responsible charge related to this service. The patient expressed understanding and agreed to proceed.   I discussed the assessment and treatment plan with the patient. The patient was provided an opportunity to ask questions and all were answered. The patient agreed with the plan and demonstrated an understanding of the instructions.   The patient was advised to call back or seek an in-person evaluation if the symptoms worsen or if the condition fails to improve as anticipated.  I provided 20 minutes of non-face-to-face time during this encounter.  HISTORY/CURRENT STATUS: HPI For routine med check.  His Dad died on patient's birthday in 01[birth year]10-2025.  "I miss him but I'm doing okay."   Thinks his meds are working well. Since his Dad died, he's moved into his trailer. He's able to enjoy things, "when I get a chance, but I can't do much b/c of covid." Energy and motivation are good.  Sleeps good.  No SI/HI.  Anxiety is controlled. The Ativan helps a lot.  Some days he doesn't take it.  But most of the time, he needs everyday.  Patient denies increased energy with decreased need for sleep, no increased talkativeness, no racing thoughts, no impulsivity or  risky behaviors, no increased spending, no increased libido, no grandiosity.  Denies dizziness, syncope, seizures, numbness, tingling, tremor, tics, unsteady gait, slurred speech, confusion. Denies muscle or joint pain, stiffness, or dystonia.  Individual Medical History/ Review of Systems: Changes? :No    Past medications for mental health diagnoses include: Ambien, Ativan, Epitol, Lamictal, Prozac, Xanax, Wellbutrin  Allergies: Patient has no known allergies.  Current Medications:  Current Outpatient Medications:  .  alfuzosin (UROXATRAL) 10 MG 24 hr tablet, Take 1 tablet (10 mg total) by mouth daily., Disp: 30 tablet, Rfl: 2 .  Ascorbic Acid (VITAMIN C) 1000 MG tablet, Take 1,000 mg by mouth daily., Disp: , Rfl:  .  aspirin EC 81 MG tablet, Take 1 tablet (81 mg total) by mouth daily., Disp: , Rfl:  .  atorvastatin (LIPITOR) 40 MG tablet, Take 1 tablet (40 mg total) by mouth at bedtime., Disp: 90 tablet, Rfl: 1 .  b complex vitamins tablet, Take 1 tablet by mouth daily., Disp: , Rfl:  .  Cholecalciferol (VITAMIN D) 2000 UNITS CAPS, Take 1 capsule by mouth 3 (three) times daily. , Disp: , Rfl:  .  CINNAMON PO, Take 1 tablet by mouth daily., Disp: , Rfl:  .  Coenzyme Q10 (COQ10 PO), Take 1 tablet by mouth daily., Disp: , Rfl:  .  FLUoxetine (PROZAC) 40 MG capsule, Take 1 capsule (40 mg total) by mouth every morning., Disp: 90 capsule, Rfl: 1 .  Garlic [id number] MG TABS, Take by mouth., Disp: , Rfl:  .  glipiZIDE (GLUCOTROL) 10  MG tablet, Take 1 tablet (10 mg total) by mouth daily., Disp: 90 tablet, Rfl: 1 .  lamoTRIgine (LAMICTAL) 150 MG tablet, Take 1 tablet (150 mg total) by mouth 2 (two) times daily., Disp: 180 tablet, Rfl: 1 .  LORazepam (ATIVAN) 1 MG tablet, TAKE 1 TABLET BY MOUTH THREE TIMES DAILY AS NEEDED FOR ANXIETY. GENERIC EQUIVALENT FOR ATIVAN., Disp: 270 tablet, Rfl: 1 .  losartan (COZAAR) 100 MG tablet, TAKE 1 TABLET BY MOUTH DAILY., Disp: 90 tablet, Rfl: 2 .  metFORMIN  (GLUCOPHAGE) 1000 MG tablet, Take 1 tablet (1,000 mg total) by mouth daily with breakfast., Disp: 90 tablet, Rfl: 3 .  metoprolol tartrate (LOPRESSOR) 25 MG tablet, TAKE 1 TABLET BY MOUTH 2 TIMES DAILY. GENERIC EQUIVALENT FOR LOPRESSOR., Disp: 180 tablet, Rfl: 2 .  Multiple Vitamin (MULTIVITAMIN WITH MINERALS) TABS tablet, Take 1 tablet by mouth daily., Disp: , Rfl:  .  omeprazole (PRILOSEC) 40 MG capsule, TAKE 1 CAPSULE BY MOUTH 20 MINUTES BEFORE BREAKFAST., Disp: 90 capsule, Rfl: 0 Medication Side Effects: none  Family Medical/ Social History: Changes? Yes Daddy died on January 15, 2019  MENTAL HEALTH EXAM:  There were no vitals taken for this visit.There is no height or weight on file to calculate BMI.  General Appearance: unable to assess  Eye Contact:  unable to assess  Speech:  Clear and Coherent  Volume:  Normal  Mood:  Euthymic  Affect:  unable to assess  Thought Process:  Goal Directed and Descriptions of Associations: Intact  Orientation:  Full (Time, Place, and Person)  Thought Content: Logical   Suicidal Thoughts:  No  Homicidal Thoughts:  No  Memory:  WNL  Judgement:  Good  Insight:  Good  Psychomotor Activity:  unable to assess  Concentration:  Concentration: Good  Recall:  Good  Fund of Knowledge: Good  Language: Good  Assets:  Desire for Improvement  ADL's:  Intact  Cognition: WNL  Prognosis:  Good    DIAGNOSES:    ICD-10-CM   1. Recurrent major depressive disorder, in partial remission ([place])  F33.41   2. Generalized anxiety disorder  F41.1     Receiving Psychotherapy: No    RECOMMENDATIONS:  PDMP was reviewed. Condolences for the loss of his father. Continue Prozac 40 mg every morning. Continue Lamictal 150 mg 1 p.o. twice daily. Continue Ativan 1 mg, 1 p.o. 3 times daily as needed. Return in 6 months.  Donnal Moat, PA-C

## 2019-06-21 ENCOUNTER — Ambulatory Visit: Payer: Medicare Other

## 2019-06-21 ENCOUNTER — Ambulatory Visit: Payer: Medicare Other | Admitting: Nurse Practitioner

## 2019-07-03 ENCOUNTER — Other Ambulatory Visit: Payer: Self-pay

## 2019-07-03 ENCOUNTER — Ambulatory Visit: Payer: Medicare Other | Admitting: Urology

## 2019-07-03 ENCOUNTER — Encounter: Payer: Self-pay | Admitting: Urology

## 2019-07-03 VITALS — BP 156/85 | HR 102 | Temp 97.3°F | Ht 69.0 in | Wt 229.0 lb

## 2019-07-03 DIAGNOSIS — R351 Nocturia: Secondary | ICD-10-CM | POA: Diagnosis not present

## 2019-07-03 DIAGNOSIS — N401 Enlarged prostate with lower urinary tract symptoms: Secondary | ICD-10-CM | POA: Diagnosis not present

## 2019-07-03 DIAGNOSIS — N481 Balanitis: Secondary | ICD-10-CM | POA: Diagnosis not present

## 2019-07-03 DIAGNOSIS — N138 Other obstructive and reflux uropathy: Secondary | ICD-10-CM | POA: Diagnosis not present

## 2019-07-03 LAB — POCT URINALYSIS DIPSTICK
Blood, UA: NEGATIVE
Glucose, UA: POSITIVE — AB
Leukocytes, UA: NEGATIVE
Nitrite, UA: NEGATIVE
Protein, UA: POSITIVE — AB
Spec Grav, UA: 1.025 (ref 1.010–1.025)
Urobilinogen, UA: NEGATIVE E.U./dL — AB
pH, UA: 5 (ref 5.0–8.0)

## 2019-07-03 LAB — BLADDER SCAN AMB NON-IMAGING: Scan Result: 2.8

## 2019-07-03 MED ORDER — ALFUZOSIN HCL ER 10 MG PO TB24: 10.0000 mg | ORAL_TABLET | Freq: Two times a day (BID) | ORAL | 3 refills | Status: DC

## 2019-07-03 NOTE — Progress Notes (Signed)
H&P  Chief Complaint: BPH w/ LUTS  History of Present Illness:   3.2.2021: Here for follow-up continuing on 1x daily alfuzosin. He continues to c/o urinary freq, urgency, and 3-4x nocturia. He often feels the urge to urinate shortly after voiding -- when he goes to double void there is hardly any urine left in his bladder. He is drinking two teas and a coffee each day. He denies having had any significant leakage aside from when he is unable to reach the bathroom quickly enough. Except when laying down, he has an adequately strong stream but does state it is much weaker than it was when he was in his 20's. He is reticent to have any surgical interventions for this.   (below copied from [facility] records):  BPH:  Robert Walters is a 64 year-old male established patient who is here today as a new patient for evaluation of BPH and lower tract symptoms.  Patient is currently treated with Rapaflo for his symptoms.   10.8.2019: BPH sx's for several years but worsening sx's for 5-6 months with leakage and having the need for pads.  He has been on tamsulosin for a few years--most recently twice daily but he has been noncompliant.  Apparently, he has been seen by Dr. Clyde Lundborg as well as Dr. Mick Sell before. He tells me that he has had cystoscopy before.  IPSS 21 QOL score 3   11.12.2019: Here for cystoscopy. He is still on 2 tamsulosin capsules a day. Cysto--mild prostatic obstruction.   12.10.2109: He is here for followup. Still on 2 flomax/day despite instructions to wean off. IPSS 18 QOL score 3. He would still like to try med Rx. At that appointment, he was switched from tamsulosin twice a day to Rapaflo.   3.24.2020: He is having improved urination. He still feels a bit bloated, however. He questions whether he is emptying appropriately. He is not having nocturia.    IPSS Questionnaire (AUA-7): Over the past month.   1)  How often have you had a sensation of not emptying your bladder completely  after you finish urinating?  3 - About half the time  2)  How often have you had to urinate again less than two hours after you finished urinating? 2 - Less than half the time  3)  How often have you found you stopped and started again several times when you urinated?  2 - Less than half the time  4) How difficult have you found it to postpone urination?  2 - Less than half the time  5) How often have you had a weak urinary stream?  4 - More than half the time  6) How often have you had to push or strain to begin urination?  2 - Less than half the time  7) How many times did you most typically get up to urinate from the time you went to bed until the time you got up in the morning?  4 - 4 times  Total score:  0-7 mildly symptomatic   8-19 moderately symptomatic   20-35 severely symptomatic   Total: 19 QoL: 3  Past Medical History:  Diagnosis Date  . Acid reflux disease   . Bipolar 1 disorder, mixed ([place])   . BPH (benign prostatic hypertrophy)   . CVA (cerebral vascular accident) ([place]) 07/2015  . Gastritis   . H/O hiatal hernia   . Hyperlipidemia   . Hypertension   . Normal nuclear stress test Dec 2011  .  Obesity   . TIA (transient ischemic attack) 05/15/2012   "they think I've had one this am @ 0130" (05/15/2012)  . Type II diabetes mellitus ([place])   . Vertigo     Past Surgical History:  Procedure Laterality Date  . BILIARY STENT PLACEMENT N/A 09/17/2016   Procedure: BILIARY STENT PLACEMENT;  Surgeon: [name], MD;  [facility];  Service: Endoscopy;  Laterality: N/A;  . CARDIOVASCULAR STRESS TEST  2009   EF 63%  . CHOLECYSTECTOMY N/A 08/25/2016   Procedure: LAPAROSCOPIC CHOLECYSTECTOMY;  Surgeon: [name], MD;  Location: [facility];  Service: General;  Laterality: N/A;  . COLONOSCOPY    . ERCP N/A 09/17/2016   Procedure: ENDOSCOPIC RETROGRADE CHOLANGIOPANCREATOGRAPHY (ERCP);  Surgeon: [name], MD;  Location: [facility];  Service: Endoscopy;   Laterality: N/A;  730  . ERCP N/A 12/03/2016   Procedure: ENDOSCOPIC RETROGRADE CHOLANGIOPANCREATOGRAPHY (ERCP);  Surgeon: [name], MD;  [facility];  Service: Gastroenterology;  Laterality: N/A;  . ESOPHAGOGASTRODUODENOSCOPY    . GASTROINTESTINAL STENT REMOVAL N/A 12/03/2016   Procedure: BILIARY STENT REMOVAL;  Surgeon: [name], MD;  [facility];  Service: Gastroenterology;  Laterality: N/A;  . NO PAST SURGERIES    . SPHINCTEROTOMY N/A 09/17/2016   Procedure: SPHINCTEROTOMY;  Surgeon: [name], MD;  [facility];  Service: Endoscopy;  Laterality: N/A;    Home Medications:  Allergies as of 07/03/2019   No Known Allergies     Medication List       Accurate as of July 03, 2019  1:26 PM. If you have any questions, ask your nurse or doctor.        alfuzosin 10 MG 24 hr tablet Commonly known as: UROXATRAL Take 1 tablet (10 mg total) by mouth daily. What changed: Another medication with the same name was added. Make sure you understand how and when to take each. Changed by: Jorja Loa, MD   alfuzosin 10 MG 24 hr tablet Commonly known as: UROXATRAL Take 1 tablet (10 mg total) by mouth 2 (two) times daily. What changed: You were already taking a medication with the same name, and this prescription was added. Make sure you understand how and when to take each. Changed by: Jorja Loa, MD   aspirin EC 81 MG tablet Take 1 tablet (81 mg total) by mouth daily.   atorvastatin 40 MG tablet Commonly known as: LIPITOR Take 1 tablet (40 mg total) by mouth at bedtime.   b complex vitamins tablet Take 1 tablet by mouth daily.   CINNAMON PO Take 1 tablet by mouth daily.   COQ10 PO Take 1 tablet by mouth daily.   FLUoxetine 40 MG capsule Commonly known as: PROZAC Take 1 capsule (40 mg total) by mouth every morning.   Garlic 123XX123 MG Tabs Take by mouth.   glipiZIDE 10 MG tablet Commonly known as: GLUCOTROL Take  1 tablet (10 mg total) by mouth daily.   lamoTRIgine 150 MG tablet Commonly known as: LAMICTAL Take 1 tablet (150 mg total) by mouth 2 (two) times daily.   LORazepam 1 MG tablet Commonly known as: ATIVAN TAKE 1 TABLET BY MOUTH THREE TIMES DAILY AS NEEDED FOR ANXIETY. GENERIC EQUIVALENT FOR ATIVAN.   losartan 100 MG tablet Commonly known as: COZAAR TAKE 1 TABLET BY MOUTH DAILY.   metFORMIN 1000 MG tablet Commonly known as: GLUCOPHAGE Take 1 tablet (1,000 mg total) by mouth daily with breakfast.  metoprolol tartrate 25 MG tablet Commonly known as: LOPRESSOR TAKE 1 TABLET BY MOUTH 2 TIMES DAILY. GENERIC EQUIVALENT FOR LOPRESSOR.   multivitamin with minerals Tabs tablet Take 1 tablet by mouth daily.   omeprazole 40 MG capsule Commonly known as: PRILOSEC TAKE 1 CAPSULE BY MOUTH 20 MINUTES BEFORE BREAKFAST.   vitamin C 1000 MG tablet Take 1,000 mg by mouth daily.   Vitamin D 50 MCG (2000 UT) Caps Take 1 capsule by mouth 3 (three) times daily.       Allergies: No Known Allergies  Family History  Problem Relation Age of Onset  . Hyperlipidemia Father        in a nursing home  . Colon cancer Father   . Stroke Mother 56    Social History:  reports that he has never smoked. He has never used smokeless tobacco. He reports that he does not drink alcohol or use drugs.  ROS: A complete review of systems was performed.  All systems are negative except for pertinent findings as noted.  Physical Exam:  Vital signs in last 24 hours: BP (!) 156/85   Pulse (!) 102   Temp (!) 97.3 F (36.3 C)   Ht 5\' 9"  (1.753 m)   Wt 229 lb (103.9 kg)   BMI 33.82 kg/m  Constitutional:  Alert and oriented, No acute distress, Overweight. Cardiovascular: Regular rate  Respiratory: Normal respiratory effort GI: Abdomen is soft, nontender, nondistended, no abdominal masses. No CVAT. No hernias.  Genitourinary: Normal male phallus, testes are descended bilaterally and non-tender and without  masses. Mild balanitis. scrotum is normal in appearance without lesions or masses, perineum is normal on inspection. Prostate feels around 30 gram in size. Lymphatic: No lymphadenopathy Neurologic: Grossly intact, no focal deficits Psychiatric: Normal mood and affect  Laboratory Data:  No results for input(s): WBC, HGB, HCT, PLT in the last 72 hours.  No results for input(s): NA, K, CL, GLUCOSE, BUN, CALCIUM, CREATININE in the last 72 hours.  Invalid input(s): CO3   Results for orders placed or performed in visit on 07/03/19 (from the past 24 hour(s))  POCT urinalysis dipstick     Status: Abnormal   Collection Time: 07/03/19  1:09 PM  Result Value Ref Range   Color, UA yellow    Clarity, UA clear    Glucose, UA Positive (A) Negative   Bilirubin, UA small    Ketones, UA small    Spec Grav, UA 1.025 1.010 - 1.025   Blood, UA neg    pH, UA 5.0 5.0 - 8.0   Protein, UA Positive (A) Negative   Urobilinogen, UA negative (A) 0.2 or 1.0 E.U./dL   Nitrite, UA neg    Leukocytes, UA Negative Negative   Appearance clear    Odor     I have reviewed prior pt notes  I have reviewed notes from referring/pr evious physicians   I have reviewed prior PSA results  Impression/Assessment:  He continues to have bothersome urinary sx's despite being on 1x daily alfuzosin. His sx's are consistent with some obstruction, which was visible on his last cystoscopy. His sx's may also be exacerbated by a slightly OAB as well as his daily caffeine intake. He is hesitant to have surgical intervention for his BPH though he is slightly less anxious about something less invasive like urolift.  He does have some balanitis -- given that he is diabetic, he is certainly at higher risk for this and I think it would be appropriate for him  to use some OTC antifungal cream.  Plan:  1. He was instructed to increase his dose of alfuzosin to twice daily.   2. Advised to start on OTC anti-fungal cream for balanitis.    3. Return in 2 mo's to check in on his urinary sx's and to discuss moving forward with urolift or another surgical intervention.

## 2019-07-03 NOTE — Progress Notes (Signed)
Urological Symptom Review  Patient is experiencing the following symptoms: Hard to postpone urination Get up at night to urinate Leakage of urine Stream starts and stops Erection problems (male only)   Review of Systems  Gastrointestinal (upper)  : Negative for upper GI symptoms  Gastrointestinal (lower) : Negative for lower GI symptoms  Constitutional : Negative for symptoms  Skin: Negative for skin symptoms  Eyes: Negative for eye symptoms  Ear/Nose/Throat : Negative for Ear/Nose/Throat symptoms  Hematologic/Lymphatic: Negative for Hematologic/Lymphatic symptoms  Cardiovascular : Negative for cardiovascular symptoms  Respiratory : Negative for respiratory symptoms  Endocrine: Negative for endocrine symptoms  Musculoskeletal: Negative for musculoskeletal symptoms  Neurological: Negative for neurological symptoms  Psychologic: Negative for psychiatric symptoms

## 2019-07-04 ENCOUNTER — Encounter: Payer: Self-pay | Admitting: Nurse Practitioner

## 2019-07-04 ENCOUNTER — Ambulatory Visit (INDEPENDENT_AMBULATORY_CARE_PROVIDER_SITE_OTHER): Payer: Medicare Other | Admitting: Nurse Practitioner

## 2019-07-04 VITALS — BP 126/80 | HR 56 | Temp 98.0°F | Ht 69.2 in | Wt 230.0 lb

## 2019-07-04 DIAGNOSIS — I129 Hypertensive chronic kidney disease with stage 1 through stage 4 chronic kidney disease, or unspecified chronic kidney disease: Secondary | ICD-10-CM

## 2019-07-04 DIAGNOSIS — E119 Type 2 diabetes mellitus without complications: Secondary | ICD-10-CM | POA: Diagnosis not present

## 2019-07-04 DIAGNOSIS — E1165 Type 2 diabetes mellitus with hyperglycemia: Secondary | ICD-10-CM

## 2019-07-04 DIAGNOSIS — N1831 Chronic kidney disease, stage 3a: Secondary | ICD-10-CM

## 2019-07-04 DIAGNOSIS — Z23 Encounter for immunization: Secondary | ICD-10-CM

## 2019-07-04 DIAGNOSIS — E782 Mixed hyperlipidemia: Secondary | ICD-10-CM | POA: Diagnosis not present

## 2019-07-04 DIAGNOSIS — I10 Essential (primary) hypertension: Secondary | ICD-10-CM

## 2019-07-04 MED ORDER — TETANUS-DIPHTH-ACELL PERTUSSIS 5-2.5-18.5 LF-MCG/0.5 IM SUSP: 0.5000 mL | Freq: Once | INTRAMUSCULAR | 0 refills | Status: AC

## 2019-07-04 MED ORDER — JARDIANCE 10 MG PO TABS: 10.0000 mg | ORAL_TABLET | Freq: Every day | ORAL | 1 refills | Status: DC

## 2019-07-04 NOTE — Progress Notes (Signed)
This visit occurred during the SARS-CoV-2 public health emergency.  Safety protocols were in place, including screening questions prior to the visit, additional usage of staff PPE, and extensive cleaning of exam room while observing appropriate contact time as indicated for disinfecting solutions.  Subjective:     Patient ID: Robert Walters , male    DOB: 1955/06/03 , 64 y.o.   MRN: [id number]   Chief Complaint  Patient presents with  . Diabetes  . Hypertension    HPI  Urologist yesterday for frequency - increased his aflozosin  Diabetes He presents for his follow-up diabetic visit. He has type 2 diabetes mellitus. His disease course has been stable. There are no hypoglycemic associated symptoms. There are no diabetic associated symptoms. Pertinent negatives for diabetes include no chest pain, no polydipsia, no polyphagia and no polyuria. There are no hypoglycemic complications. Symptoms are stable. There are no diabetic complications. Risk factors for coronary artery disease include diabetes mellitus, sedentary lifestyle, male sex and obesity. He is compliant with treatment all of the time. He is following a generally healthy diet. He has not had a previous visit with a dietitian. He rarely participates in exercise. (No checking blood sugars.)  Hypertension This is a chronic problem. The current episode started more than 1 year ago. The problem is unchanged. The problem is controlled. Pertinent negatives include no anxiety, chest pain or palpitations. There are no associated agents to hypertension. Risk factors for coronary artery disease include obesity and sedentary lifestyle. There are no compliance problems.      Past Medical History:  Diagnosis Date  . Acid reflux disease   . Bipolar 1 disorder, mixed ([place])   . BPH (benign prostatic hypertrophy)   . CVA (cerebral vascular accident) ([place]) 07/2015  . Gastritis   . H/O hiatal hernia   . Hyperlipidemia   . Hypertension   . Normal  nuclear stress test Dec 2011  . Obesity   . TIA (transient ischemic attack) 05/15/2012   "they think I've had one this am @ 0130" (05/15/2012)  . Type II diabetes mellitus ([place])   . Vertigo      Family History  Problem Relation Age of Onset  . Hyperlipidemia Father        in a nursing home  . Colon cancer Father   . Stroke Mother 53     Current Outpatient Medications:  .  alfuzosin (UROXATRAL) 10 MG 24 hr tablet, Take 1 tablet (10 mg total) by mouth daily., Disp: 30 tablet, Rfl: 2 .  alfuzosin (UROXATRAL) 10 MG 24 hr tablet, Take 1 tablet (10 mg total) by mouth 2 (two) times daily., Disp: 180 tablet, Rfl: 3 .  Ascorbic Acid (VITAMIN C) 1000 MG tablet, Take 1,000 mg by mouth daily., Disp: , Rfl:  .  aspirin EC 81 MG tablet, Take 1 tablet (81 mg total) by mouth daily., Disp: , Rfl:  .  atorvastatin (LIPITOR) 40 MG tablet, Take 1 tablet (40 mg total) by mouth at bedtime., Disp: 90 tablet, Rfl: 1 .  b complex vitamins tablet, Take 1 tablet by mouth daily., Disp: , Rfl:  .  Cholecalciferol (VITAMIN D) 2000 UNITS CAPS, Take 1 capsule by mouth 3 (three) times daily. , Disp: , Rfl:  .  CINNAMON PO, Take 1 tablet by mouth daily., Disp: , Rfl:  .  Coenzyme Q10 (COQ10 PO), Take 1 tablet by mouth daily., Disp: , Rfl:  .  FLUoxetine (PROZAC) 40 MG capsule, Take 1 capsule (40 mg  total) by mouth every morning., Disp: 90 capsule, Rfl: 1 .  Garlic 532 MG TABS, Take by mouth., Disp: , Rfl:  .  glipiZIDE (GLUCOTROL) 10 MG tablet, Take 1 tablet (10 mg total) by mouth daily., Disp: 90 tablet, Rfl: 1 .  lamoTRIgine (LAMICTAL) 150 MG tablet, Take 1 tablet (150 mg total) by mouth 2 (two) times daily., Disp: 180 tablet, Rfl: 1 .  LORazepam (ATIVAN) 1 MG tablet, TAKE 1 TABLET BY MOUTH THREE TIMES DAILY AS NEEDED FOR ANXIETY. GENERIC EQUIVALENT FOR ATIVAN., Disp: 270 tablet, Rfl: 1 .  losartan (COZAAR) 100 MG tablet, TAKE 1 TABLET BY MOUTH DAILY., Disp: 90 tablet, Rfl: 2 .  metFORMIN (GLUCOPHAGE) 1000 MG tablet,  Take 1 tablet (1,000 mg total) by mouth daily with breakfast., Disp: 90 tablet, Rfl: 3 .  metoprolol tartrate (LOPRESSOR) 25 MG tablet, TAKE 1 TABLET BY MOUTH 2 TIMES DAILY. GENERIC EQUIVALENT FOR LOPRESSOR., Disp: 180 tablet, Rfl: 2 .  Multiple Vitamin (MULTIVITAMIN WITH MINERALS) TABS tablet, Take 1 tablet by mouth daily., Disp: , Rfl:  .  omeprazole (PRILOSEC) 40 MG capsule, TAKE 1 CAPSULE BY MOUTH 20 MINUTES BEFORE BREAKFAST., Disp: 90 capsule, Rfl: 0   No Known Allergies   Review of Systems  Constitutional: Negative.   Respiratory: Negative.  Negative for cough.   Cardiovascular: Negative.  Negative for chest pain, palpitations and leg swelling.  Endocrine: Negative for polydipsia, polyphagia and polyuria.  Neurological: Negative.   Psychiatric/Behavioral: Negative.      Today's Vitals   07/04/19 1146  BP: 126/80  Pulse: (!) 56  Temp: 98 F (36.7 C)  TempSrc: Oral  Weight: 230 lb (104.3 kg)  Height: 5' 9.2" (1.758 m)  PainSc: 0-No pain   Body mass index is 33.77 kg/m.   Objective:  Physical Exam Constitutional:      General: He is not in acute distress.    Appearance: Normal appearance. He is well-developed. He is obese.  Eyes:     Extraocular Movements: Extraocular movements intact.     Conjunctiva/sclera: Conjunctivae normal.     Pupils: Pupils are equal, round, and reactive to light.  Neck:     Thyroid: No thyromegaly.     Vascular: No carotid bruit.  Cardiovascular:     Rate and Rhythm: Normal rate and regular rhythm.     Pulses: Normal pulses.     Heart sounds: Normal heart sounds, S1 normal and S2 normal. No murmur.  Pulmonary:     Effort: Pulmonary effort is normal.     Breath sounds: Normal breath sounds.  Abdominal:     General: Bowel sounds are normal.     Palpations: Abdomen is soft.  Musculoskeletal:     Cervical back: Full passive range of motion without pain, normal range of motion and neck supple. No edema.  Skin:    General: Skin is warm and  dry.     Capillary Refill: Capillary refill takes less than 2 seconds.  Neurological:     General: No focal deficit present.     Mental Status: He is alert and oriented to person, place, and time.  Psychiatric:        Mood and Affect: Mood normal.        Speech: Speech normal.        Behavior: Behavior normal.        Thought Content: Thought content normal.        Judgment: Judgment normal.         Assessment And  Plan:     1. Essential hypertension  Chronic, good control  Continue with current medications  2. Stage 3a chronic kidney disease  Discussed importance of avoiding taking NSAIDs and to stay well hydrated with water  3. Mixed hyperlipidemia  Chronic, controlled  Continue with current medications - Lipid panel  4. Encounter for immunization Rx sent to pharmacy - Tdap ([place]) 5-2.5-18.5 LF-MCG/0.5 injection; Inject 0.5 mLs into the muscle once for 1 dose.  Dispense: 0.5 mL; Refill: 0  5. Uncontrolled type 2 diabetes mellitus with hyperglycemia (HCC)  Chronic, worsening  His HgbA1c has been increasing over the last few visits  I have discussed with him the importance to eat a healthy diet and avoid eating increased amounts of junk food  We will start him on 10 mg of Jardiance daily - empagliflozin (JARDIANCE) 10 MG TABS tablet; Take 10 mg by mouth daily before breakfast.  Dispense: 90 tablet; Refill: 1 - CMP14+EGFR - Hemoglobin A1c - Referral to Chronic Care Management Services   [name], FNP    THE PATIENT IS ENCOURAGED TO PRACTICE SOCIAL DISTANCING DUE TO THE COVID-19 PANDEMIC.

## 2019-07-05 LAB — CMP14+EGFR
ALT: 79 IU/L — ABNORMAL HIGH (ref 0–44)
AST: 109 IU/L — ABNORMAL HIGH (ref 0–40)
Albumin/Globulin Ratio: 1.8 (ref 1.2–2.2)
Albumin: 4.5 g/dL (ref 3.8–4.8)
Alkaline Phosphatase: 140 IU/L — ABNORMAL HIGH (ref 39–117)
BUN/Creatinine Ratio: 14 (ref 10–24)
BUN: 19 mg/dL (ref 8–27)
Bilirubin Total: 0.7 mg/dL (ref 0.0–1.2)
CO2: 23 mmol/L (ref 20–29)
Calcium: 9.8 mg/dL (ref 8.6–10.2)
Chloride: 96 mmol/L (ref 96–106)
Creatinine, Ser: 1.38 mg/dL — ABNORMAL HIGH (ref 0.76–1.27)
GFR calc Af Amer: 62 mL/min/{1.73_m2} (ref >59)
GFR calc non Af Amer: 53 mL/min/{1.73_m2} — ABNORMAL LOW (ref >59)
Globulin, Total: 2.5 g/dL (ref 1.5–4.5)
Glucose: 255 mg/dL — ABNORMAL HIGH (ref 65–99)
Potassium: 5.1 mmol/L (ref 3.5–5.2)
Sodium: 136 mmol/L (ref 134–144)
Total Protein: 7 g/dL (ref 6.0–8.5)

## 2019-07-05 LAB — LIPID PANEL
Chol/HDL Ratio: 5.7 ratio — ABNORMAL HIGH (ref 0.0–5.0)
Cholesterol, Total: 228 mg/dL — ABNORMAL HIGH (ref 100–199)
HDL: 40 mg/dL (ref >39)
LDL Chol Calc (NIH): 107 mg/dL — ABNORMAL HIGH (ref 0–99)
Triglycerides: 476 mg/dL — ABNORMAL HIGH (ref 0–149)
VLDL Cholesterol Cal: 81 mg/dL — ABNORMAL HIGH (ref 5–40)

## 2019-07-05 LAB — HEMOGLOBIN A1C
Est. average glucose Bld gHb Est-mCnc: 237 mg/dL
Hgb A1c MFr Bld: 9.9 % — ABNORMAL HIGH (ref 4.8–5.6)

## 2019-07-10 ENCOUNTER — Ambulatory Visit: Payer: Self-pay

## 2019-07-10 DIAGNOSIS — E119 Type 2 diabetes mellitus without complications: Secondary | ICD-10-CM

## 2019-07-10 DIAGNOSIS — I10 Essential (primary) hypertension: Secondary | ICD-10-CM

## 2019-07-10 NOTE — Chronic Care Management (AMB) (Signed)
  Chronic Care Management   Outreach Note  07/10/2019 Name: Robert Walters MRN: [id number] DOB: [birth year]  Referred by: [name], FNP Reason for referral : Care Coordination   An unsuccessful telephone outreach was attempted today. The patient was referred to the case management team for assistance with care management and care coordination.   Follow Up Plan: The care management team will reach out to the patient again over the next 14 days.   Daneen Schick, BSW, CDP Social Worker, Certified Dementia Practitioner [place] [facility] [phone]

## 2019-07-11 ENCOUNTER — Encounter: Payer: Self-pay | Admitting: Urology

## 2019-07-16 ENCOUNTER — Ambulatory Visit: Payer: Self-pay

## 2019-07-16 ENCOUNTER — Telehealth: Payer: Self-pay

## 2019-07-16 ENCOUNTER — Other Ambulatory Visit: Payer: Self-pay

## 2019-07-16 DIAGNOSIS — I10 Essential (primary) hypertension: Secondary | ICD-10-CM

## 2019-07-16 DIAGNOSIS — E782 Mixed hyperlipidemia: Secondary | ICD-10-CM

## 2019-07-16 DIAGNOSIS — E119 Type 2 diabetes mellitus without complications: Secondary | ICD-10-CM

## 2019-07-16 DIAGNOSIS — N1831 Chronic kidney disease, stage 3a: Secondary | ICD-10-CM

## 2019-07-16 NOTE — Chronic Care Management (AMB) (Signed)
Chronic Care Management   Initial Visit Note  07/16/2019 Name: Robert Walters MRN: [id number] DOB: 07/05/1955  Referred by: [name], FNP Reason for referral : Chronic Care Management (Case Collaboration )   ZARIUS LULAY is a 64 y.o. year old male who is a primary care patient of [name], [place]. The care management team was consulted for assistance with chronic disease management and care coordination needs related to HTN, HLD, DMII and CKD Stage 3 a.  Review of patient status, including review of consultants reports, relevant laboratory and other test results, and collaboration with appropriate care team members and the patient's provider was performed as part of comprehensive patient evaluation and provision of chronic care management services.    I initiated and established the plan of care for Kathyrn Lass during one on one collaboration with my clinical care management colleague Daneen Schick BSW who is also engaged with this patient to address social work needs.   Outpatient Encounter Medications as of 07/16/2019  Medication Sig  . alfuzosin (UROXATRAL) 10 MG 24 hr tablet Take 1 tablet (10 mg total) by mouth daily.  [name] alfuzosin (UROXATRAL) 10 MG 24 hr tablet Take 1 tablet (10 mg total) by mouth 2 (two) times daily.  . Ascorbic Acid (VITAMIN C) 1000 MG tablet Take 1,000 mg by mouth daily.  [name] aspirin EC 81 MG tablet Take 1 tablet (81 mg total) by mouth daily.  [name] atorvastatin (LIPITOR) 40 MG tablet Take 1 tablet (40 mg total) by mouth at bedtime.  [name] b complex vitamins tablet Take 1 tablet by mouth daily.  . Cholecalciferol (VITAMIN D) 2000 UNITS CAPS Take 1 capsule by mouth 3 (three) times daily.   [name] CINNAMON PO Take 1 tablet by mouth daily.  . Coenzyme Q10 (COQ10 PO) Take 1 tablet by mouth daily.  . empagliflozin (JARDIANCE) 10 MG TABS tablet Take 10 mg by mouth daily before breakfast.  . FLUoxetine (PROZAC) 40 MG capsule Take 1 capsule (40 mg total) by mouth every  morning.  . Garlic 123XX123 MG TABS Take by mouth.  [name] glipiZIDE (GLUCOTROL) 10 MG tablet Take 1 tablet (10 mg total) by mouth daily.  [name] lamoTRIgine (LAMICTAL) 150 MG tablet Take 1 tablet (150 mg total) by mouth 2 (two) times daily.  [name] LORazepam (ATIVAN) 1 MG tablet TAKE 1 TABLET BY MOUTH THREE TIMES DAILY AS NEEDED FOR ANXIETY. GENERIC EQUIVALENT FOR ATIVAN.  [name] losartan (COZAAR) 100 MG tablet TAKE 1 TABLET BY MOUTH DAILY.  . metFORMIN (GLUCOPHAGE) 1000 MG tablet Take 1 tablet (1,000 mg total) by mouth daily with breakfast.  . metoprolol tartrate (LOPRESSOR) 25 MG tablet TAKE 1 TABLET BY MOUTH 2 TIMES DAILY. GENERIC EQUIVALENT FOR LOPRESSOR.  . Multiple Vitamin (MULTIVITAMIN WITH MINERALS) TABS tablet Take 1 tablet by mouth daily.  [name] omeprazole (PRILOSEC) 40 MG capsule TAKE 1 CAPSULE BY MOUTH 20 MINUTES BEFORE BREAKFAST.   No facility-administered encounter medications on file as of 07/16/2019.    Objective:   Lab Results  Component Value Date   HGBA1C 9.9 (H) 07/04/2019   HGBA1C 8.5 (H) 04/10/2019   HGBA1C 7.2 (H) 11/29/2018   Lab Results  Component Value Date   MICROALBUR 80 03/21/2019   LDLCALC 107 (H) 07/04/2019   CREATININE 1.38 (H) 07/04/2019   BP Readings from Last 3 Encounters:  07/04/19 126/80  07/03/19 (!) 156/85  03/21/19 120/78   Goals Addressed    . Assist with Chronic Care Management and Care Coordination needs  CARE PLAN ENTRY (see longtitudinal plan of care for additional care plan information)   Current Barriers:  . Chronic Disease Management support, education, and care coordination needs related to HTN, HLD, DMII, and CKD Stage 3 a  Case Manager Clinical Goal(s):  [name] Over the next 30 days, patient will work with the CCM team to address needs related to chronic disease education and support in patient with HTN, HLD, DMII, and CKD Stage 3 a   Interventions:  . Collaborated with BSW to initiate plan of care to address needs related to Chronic Care  Management and Care Coordination needs in patient with HTN, HLD, DMII, and CKD Stage 3 a  Patient Self Care Activities:  . Self administers medications as prescribed . Attends all scheduled provider appointments . Calls pharmacy for medication refills . Calls provider office for new concerns or questions  Initial goal documentation         Telephone follow up appointment with care management team member scheduled for:08/03/19  Barb Merino, RN, BSN, CCM Care Management Coordinator [facility]  Direct Phone: [phone]

## 2019-07-17 NOTE — Chronic Care Management (AMB) (Signed)
Chronic Care Management    Social Work General Note  07/16/2019 Name: Robert Walters MRN: [id number] DOB: 06/30/1955  Robert Walters is a 64 y.o. year old male who is a primary care patient of [name], [place]. The CCM was consulted to assist the patient with care coordination.   Robert Walters was given information about Chronic Care Management services today including:  1. CCM service includes personalized support from designated clinical staff supervised by his physician, including individualized plan of care and coordination with other care providers 2. 24/7 contact phone numbers for assistance for urgent and routine care needs. 3. Service will only be billed when office clinical staff spend 20 minutes or more in a month to coordinate care. 4. Only one practitioner may furnish and bill the service in a calendar month. 5. The patient may stop CCM services at any time (effective at the end of the month) by phone call to the office staff. 6. The patient will be responsible for cost sharing (co-pay) of up to 20% of the service fee (after annual deductible is met).  Patient agreed to services and verbal consent obtained.   Review of patient status, including review of consultants reports, relevant laboratory and other test results, and collaboration with appropriate care team members and the patient's provider was performed as part of comprehensive patient evaluation and provision of chronic care management services.    SDOH (Social Determinants of Health) assessments and interventions performed:  Yes, no challenges noted at this time.    Outpatient Encounter Medications as of 07/16/2019  Medication Sig  . alfuzosin (UROXATRAL) 10 MG 24 hr tablet Take 1 tablet (10 mg total) by mouth daily.  [name] alfuzosin (UROXATRAL) 10 MG 24 hr tablet Take 1 tablet (10 mg total) by mouth 2 (two) times daily.  . Ascorbic Acid (VITAMIN C) 1000 MG tablet Take 1,000 mg by mouth daily.  [name] aspirin EC 81 MG tablet  Take 1 tablet (81 mg total) by mouth daily.  [name] atorvastatin (LIPITOR) 40 MG tablet Take 1 tablet (40 mg total) by mouth at bedtime.  [name] b complex vitamins tablet Take 1 tablet by mouth daily.  . Cholecalciferol (VITAMIN D) 2000 UNITS CAPS Take 1 capsule by mouth 3 (three) times daily.   [name] CINNAMON PO Take 1 tablet by mouth daily.  . Coenzyme Q10 (COQ10 PO) Take 1 tablet by mouth daily.  . empagliflozin (JARDIANCE) 10 MG TABS tablet Take 10 mg by mouth daily before breakfast.  . FLUoxetine (PROZAC) 40 MG capsule Take 1 capsule (40 mg total) by mouth every morning.  . Garlic 149 MG TABS Take by mouth.  [name] glipiZIDE (GLUCOTROL) 10 MG tablet Take 1 tablet (10 mg total) by mouth daily.  [name] lamoTRIgine (LAMICTAL) 150 MG tablet Take 1 tablet (150 mg total) by mouth 2 (two) times daily.  [name] LORazepam (ATIVAN) 1 MG tablet TAKE 1 TABLET BY MOUTH THREE TIMES DAILY AS NEEDED FOR ANXIETY. GENERIC EQUIVALENT FOR ATIVAN.  [name] losartan (COZAAR) 100 MG tablet TAKE 1 TABLET BY MOUTH DAILY.  . metFORMIN (GLUCOPHAGE) 1000 MG tablet Take 1 tablet (1,000 mg total) by mouth daily with breakfast.  . metoprolol tartrate (LOPRESSOR) 25 MG tablet TAKE 1 TABLET BY MOUTH 2 TIMES DAILY. GENERIC EQUIVALENT FOR LOPRESSOR.  . Multiple Vitamin (MULTIVITAMIN WITH MINERALS) TABS tablet Take 1 tablet by mouth daily.  [name] omeprazole (PRILOSEC) 40 MG capsule TAKE 1 CAPSULE BY MOUTH 20 MINUTES BEFORE BREAKFAST.   No facility-administered encounter medications on  file as of 07/16/2019.    Goals Addressed            This Visit's Progress   . Collaborate with RN Care Manager to perform appropriate assessments to determine care management and care coordination needs       CARE PLAN ENTRY (see longtitudinal plan of care for additional care plan information)   Current Barriers:  [name] Knowledge Barriers related to resources and support available to address needs related to  DM II  Social Worker Clinical Goal(s):  [name] Over the next 60 days  the patient will work the care management team to become more knowledgeable of how to better manage Diabetes through diet and medication adherence.  CCM SW Interventions:  Completed 07/16/2019 . Patient interviewed and appropriate assessments performed . Performed chart review to note recent A1C at 9.9 o Patient unclear why A1C continues to rise and states "I have just decided to stop eating sugar" . Discussed importance of water intake and daily exercise routine . Determined the patient is interested in working with RN Care Manager to better manage DM II in hopes of avoiding needing insulin . Collaborated with RN Care Manager regarding patient enrollment and desire to work on management of DM II . Advised the patient to expect a call from [place] over the next month   Patient Self Care Activities:  . Patient verbalizes understanding of plan to work with embedded care management team to assist with disease management and care coordination . Self administers medications as prescribed . Attends all scheduled provider appointments . Performs ADL's independently . Performs IADL's independently . Calls provider office for new concerns or questions  Initial goal documentation         Follow Up Plan: SW will follow up with patient by phone over the next 60 days.      Daneen Schick, BSW, CDP Social Worker, Certified Dementia Practitioner [facility] [facility] [phone]  Total time spent performing care coordination and/or care management activities with the patient by phone or face to face = 35 minutes.

## 2019-07-17 NOTE — Patient Instructions (Signed)
Social Worker Visit Information  Goals we discussed today:  Goals Addressed            This Visit's Progress   . Collaborate with RN Care Manager to perform appropriate assessments to determine care management and care coordination needs       CARE PLAN ENTRY (see longtitudinal plan of care for additional care plan information)   Current Barriers:  [name] Knowledge Barriers related to resources and support available to address needs related to  DM II  Social Worker Clinical Goal(s):  [name] Over the next 60 days the patient will work the care management team to become more knowledgeable of how to better manage Diabetes through diet and medication adherence.  CCM SW Interventions:  Completed 07/16/2019 . Patient interviewed and appropriate assessments performed . Performed chart review to note recent A1C at 9.9 o Patient unclear why A1C continues to rise and states "I have just decided to stop eating sugar" . Discussed importance of water intake and daily exercise routine . Determined the patient is interested in working with RN Care Manager to better manage DM II in hopes of avoiding needing insulin . Collaborated with RN Care Manager regarding patient enrollment and desire to work on management of DM II . Advised the patient to expect a call from [place] over the next month   Patient Self Care Activities:  . Patient verbalizes understanding of plan to work with embedded care management team to assist with disease management and care coordination . Self administers medications as prescribed . Attends all scheduled provider appointments . Performs ADL's independently . Performs IADL's independently . Calls provider office for new concerns or questions  Initial goal documentation         Materials provided: Verbal education about care management program provided by phone  Mr. Lampe was given information about Chronic Care Management services today including:  1. CCM  service includes personalized support from designated clinical staff supervised by his physician, including individualized plan of care and coordination with other care providers 2. 24/7 contact phone numbers for assistance for urgent and routine care needs. 3. Service will only be billed when office clinical staff spend 20 minutes or more in a month to coordinate care. 4. Only one practitioner may furnish and bill the service in a calendar month. 5. The patient may stop CCM services at any time (effective at the end of the month) by phone call to the office staff. 6. The patient will be responsible for cost sharing (co-pay) of up to 20% of the service fee (after annual deductible is met).  Patient agreed to services and verbal consent obtained.   The patient verbalized understanding of instructions provided today and declined a print copy of patient instruction materials.   Follow up plan: SW will follow up with patient by phone over the next 60 days.  Daneen Schick, BSW, CDP Social Worker, Certified Dementia Practitioner [place] [facility] [phone]

## 2019-07-25 ENCOUNTER — Encounter: Payer: Self-pay | Admitting: Nurse Practitioner

## 2019-08-03 ENCOUNTER — Telehealth: Payer: Self-pay

## 2019-08-07 DIAGNOSIS — Z0120 Encounter for dental examination and cleaning without abnormal findings: Secondary | ICD-10-CM | POA: Diagnosis not present

## 2019-08-20 ENCOUNTER — Other Ambulatory Visit: Payer: Self-pay

## 2019-08-20 ENCOUNTER — Telehealth: Payer: Self-pay

## 2019-08-20 ENCOUNTER — Ambulatory Visit (INDEPENDENT_AMBULATORY_CARE_PROVIDER_SITE_OTHER): Payer: Medicare Other

## 2019-08-20 DIAGNOSIS — E782 Mixed hyperlipidemia: Secondary | ICD-10-CM | POA: Diagnosis not present

## 2019-08-20 DIAGNOSIS — E119 Type 2 diabetes mellitus without complications: Secondary | ICD-10-CM | POA: Diagnosis not present

## 2019-08-20 DIAGNOSIS — I10 Essential (primary) hypertension: Secondary | ICD-10-CM | POA: Diagnosis not present

## 2019-08-20 DIAGNOSIS — N1831 Chronic kidney disease, stage 3a: Secondary | ICD-10-CM | POA: Diagnosis not present

## 2019-08-21 ENCOUNTER — Other Ambulatory Visit: Payer: Self-pay

## 2019-08-21 MED ORDER — GLUCOSE BLOOD VI STRP: ORAL_STRIP | 2 refills | Status: DC

## 2019-08-21 NOTE — Patient Instructions (Signed)
Visit Information  Goals Addressed      Patient Stated   . "I would like to improve my kidney function" (pt-stated)       CARE PLAN ENTRY (see longitudinal plan of care for additional care plan information)  Current Barriers:  [name] Knowledge Deficits related to disease process and Self Health management of CKD . Chronic Disease Management support and education needs related to DMII, Essential Hypertension, Stage 3a CKD, Mixed Hyperlipidemia   Nurse Case Manager Clinical Goal(s):  [name] Over the next 90 days, patient will work with the [facility] CM and PCP to address needs related to disease education and support to improve CKD  CCM RN CM Interventions:  08/20/19 call completed with patient  . Inter-disciplinary care team collaboration (see longitudinal plan of care) . Evaluation of current treatment plan related to CKD and patient's adherence to plan as established by provider. . Provided education to patient re: current renal status; Reviewed and discussed most recent CMP including BUN/Creatinine and GFR; Educated on stages of CKD and Self Health management to help improve kidney function  . Discussed plans with patient for ongoing care management follow up and provided patient with direct contact information for care management team . Provided patient with printed educational materials related to Chronic Kidney Disease; 6 Ways to be Water Wise  Patient Self Care Activities:  . Self administers medications as prescribed . Attends all scheduled provider appointments . Calls pharmacy for medication refills . Performs ADL's independently . Performs IADL's independently . Calls provider office for new concerns or questions  Initial goal documentation     . "to lower my A1c" (pt-stated)       CARE PLAN ENTRY (see longitudinal plan of care for additional care plan information)  Current Barriers:  [name] Knowledge Deficits related to disease process and Self Health management of DM  . Chronic  Disease Management support and education needs related to DMII, Essential Hypertension, Stage 3a CKD, Mixed Hyperlipidemia   Nurse Case Manager Clinical Goal(s):  [name] Over the next 90 days, patient will work with CCM RN CM and PCP to address needs related to disease education and support to improve Self Health management of DM  CCM RN CM Interventions:  08/20/19 call completed with patient  . Inter-disciplinary care team collaboration (see longitudinal plan of care) . Evaluation of current treatment plan related to DM and patient's adherence to plan as established by provider. . Provided education to patient re: current A1c is up to 9.9; Educated on target A1c <7.0; Education provided about complications that may occur if left uncontrolled; Educated on [place] management, monitoring CBG twice daily before meals and logging BS; Educated on daily glycemic control FBS 80-130; <180 after meals; Educated on importance of implementing exercise to daily routine, maintaining healthy weight and adhering to diabetic diet: Determined patient has received this information from a Registered dietician in the past but did not follow through; Determined he is currently not chekcing his CBG's but has a working glucometer and is comfortable in doing so: Discussed patient will begin Self monitoring CBG's and logging twice daily before meals and will review with CCM RN CM at next call   . Reviewed medications with patient and discussed indication, dosage and frequency of prescribed medications for diabetes: Determined patient understands his regimen and is adhering, denies financial hardship . Collaborated with PCP via in basket message  regarding need for new Rx for Accucheck glucose strips to be sent to [facility] in [place]  .  Discussed plans with patient for ongoing care management follow up and provided patient with direct contact information for care management team . Provided patient with printed educational  materials related to Diabetes Management using Meal Planning; Diabetes Management Zone Tool; Carb Counting; Carb Choices: BS log . Advised patient, providing education and rationale, to check cbg 2-3 times daily before meals and record, calling the CCM RN CM and or PCP for findings outside established parameters.    Patient Self Care Activities:  . Self administers medications as prescribed . Attends all scheduled provider appointments . Calls pharmacy for medication refills . Performs ADL's independently . Performs IADL's independently . Calls provider office for new concerns or questions  Initial goal documentation       Other   . COMPLETED: Assist with Chronic Care Management and Care Coordination needs        CARE PLAN ENTRY (see longtitudinal plan of care for additional care plan information)   Current Barriers:  . Chronic Disease Management support, education, and care coordination needs related to HTN, HLD, DMII, and CKD Stage 3 a  Case Manager Clinical Goal(s):  [name] Over the next 30 days, patient will work with the CCM team to address needs related to chronic disease education and support in patient with HTN, HLD, DMII, and CKD Stage 3 a   Interventions:  . Collaborated with BSW to initiate plan of care to address needs related to Chronic Care Management and Care Coordination needs in patient with HTN, HLD, DMII, and CKD Stage 3 a  Patient Self Care Activities:  . Self administers medications as prescribed . Attends all scheduled provider appointments . Calls pharmacy for medication refills . Calls provider office for new concerns or questions  Initial goal documentation       Patient verbalizes understanding of instructions provided today.   Telephone follow up appointment with care management team member scheduled for: 08/31/19  Barb Merino, RN, BSN, CCM Care Management Coordinator [facility]  Direct Phone:  [phone]

## 2019-08-21 NOTE — Chronic Care Management (AMB) (Signed)
Chronic Care Management   Initial Visit Note  08/21/2019 Name: Robert Walters MRN: [id number] DOB: 1955-08-03  Referred by: [name], FNP Reason for referral : Chronic Care Management (RQ Initial RN Call DM, CKD, HTN, HLD )   Robert Walters is a 64 y.o. year old male who is a primary care patient of [name], [place]. The CCM team was consulted for assistance with chronic disease management and care coordination needs related to DMII, Essential Hypertension, Stage 3a CKD, Mixed Hyperlipidemia   Review of patient status, including review of consultants reports, relevant laboratory and other test results, and collaboration with appropriate care team members and the patient's provider was performed as part of comprehensive patient evaluation and provision of chronic care management services.    SDOH (Social Determinants of Health) assessments performed: No See Care Plan activities for detailed interventions related to [place] outbound initial CCM RN CM call to patient to assess for CCM needs, a care plan was established.     Medications: Outpatient Encounter Medications as of 08/20/2019  Medication Sig  . alfuzosin (UROXATRAL) 10 MG 24 hr tablet Take 1 tablet (10 mg total) by mouth 2 (two) times daily.  . Ascorbic Acid (VITAMIN C) 1000 MG tablet Take 1,000 mg by mouth daily.  Robert Walters aspirin EC 81 MG tablet Take 1 tablet (81 mg total) by mouth daily.  Robert Walters atorvastatin (LIPITOR) 40 MG tablet Take 1 tablet (40 mg total) by mouth at bedtime.  Robert Walters b complex vitamins tablet Take 1 tablet by mouth daily.  . Cholecalciferol (VITAMIN D) 2000 UNITS CAPS Take 1 capsule by mouth 3 (three) times daily.   Robert Walters CINNAMON PO Take 1 tablet by mouth daily.  . Coenzyme Q10 (COQ10 PO) Take 1 tablet by mouth daily.  . empagliflozin (JARDIANCE) 10 MG TABS tablet Take 10 mg by mouth daily before breakfast.  . FLUoxetine (PROZAC) 40 MG capsule Take 1 capsule (40 mg total) by mouth every morning.  . Garlic 123XX123 MG  TABS Take by mouth.  Robert Walters glipiZIDE (GLUCOTROL) 10 MG tablet Take 1 tablet (10 mg total) by mouth daily.  Robert Walters lamoTRIgine (LAMICTAL) 150 MG tablet Take 1 tablet (150 mg total) by mouth 2 (two) times daily.  Robert Walters LORazepam (ATIVAN) 1 MG tablet TAKE 1 TABLET BY MOUTH THREE TIMES DAILY AS NEEDED FOR ANXIETY. GENERIC EQUIVALENT FOR ATIVAN.  Robert Walters losartan (COZAAR) 100 MG tablet TAKE 1 TABLET BY MOUTH DAILY.  . metFORMIN (GLUCOPHAGE) 1000 MG tablet Take 1 tablet (1,000 mg total) by mouth daily with breakfast.  . metoprolol tartrate (LOPRESSOR) 25 MG tablet TAKE 1 TABLET BY MOUTH 2 TIMES DAILY. GENERIC EQUIVALENT FOR LOPRESSOR.  . Multiple Vitamin (MULTIVITAMIN WITH MINERALS) TABS tablet Take 1 tablet by mouth daily.  Robert Walters omeprazole (PRILOSEC) 40 MG capsule TAKE 1 CAPSULE BY MOUTH 20 MINUTES BEFORE BREAKFAST.  Robert Walters alfuzosin (UROXATRAL) 10 MG 24 hr tablet Take 1 tablet (10 mg total) by mouth daily.   No facility-administered encounter medications on file as of 08/20/2019.     Objective:  Lab Results  Component Value Date   HGBA1C 9.9 (H) 07/04/2019   HGBA1C 8.5 (H) 04/10/2019   HGBA1C 7.2 (H) 11/29/2018   Lab Results  Component Value Date   MICROALBUR 80 03/21/2019   LDLCALC 107 (H) 07/04/2019   CREATININE 1.38 (H) 07/04/2019   BP Readings from Last 3 Encounters:  07/04/19 126/80  07/03/19 (!) 156/85  03/21/19 120/78    Goals Addressed  Patient Stated   . "I would like to improve my kidney function" (pt-stated)       CARE PLAN ENTRY (see longitudinal plan of care for additional care plan information)  Current Barriers:  Robert Walters Knowledge Deficits related to disease process and Self Health management of CKD . Chronic Disease Management support and education needs related to DMII, Essential Hypertension, Stage 3a CKD, Mixed Hyperlipidemia   Nurse Case Manager Clinical Goal(s):  Robert Walters Over the next 90 days, patient will work with the [place] CM and PCP to address needs related to disease education and  support to improve CKD  CCM RN CM Interventions:  08/20/19 call completed with patient  . Inter-disciplinary care team collaboration (see longitudinal plan of care) . Evaluation of current treatment plan related to CKD and patient's adherence to plan as established by provider. . Provided education to patient re: current renal status; Reviewed and discussed most recent CMP including BUN/Creatinine and GFR; Educated on stages of CKD and Self Health management to help improve kidney function  . Discussed plans with patient for ongoing care management follow up and provided patient with direct contact information for care management team . Provided patient with printed educational materials related to Chronic Kidney Disease; 6 Ways to be Water Wise  Patient Self Care Activities:  . Self administers medications as prescribed . Attends all scheduled provider appointments . Calls pharmacy for medication refills . Performs ADL's independently . Performs IADL's independently . Calls provider office for new concerns or questions  Initial goal documentation     . "to lower my A1c" (pt-stated)       CARE PLAN ENTRY (see longitudinal plan of care for additional care plan information)  Current Barriers:  Robert Walters Knowledge Deficits related to disease process and Self Health management of DM  . Chronic Disease Management support and education needs related to DMII, Essential Hypertension, Stage 3a CKD, Mixed Hyperlipidemia   Nurse Case Manager Clinical Goal(s):  Robert Walters Over the next 90 days, patient will work with CCM RN CM and PCP to address needs related to disease education and support to improve Self Health management of DM  CCM RN CM Interventions:  08/20/19 call completed with patient  . Inter-disciplinary care team collaboration (see longitudinal plan of care) . Evaluation of current treatment plan related to DM and patient's adherence to plan as established by provider. . Provided education to  patient re: current A1c is up to 9.9; Educated on target A1c <7.0; Education provided about complications that may occur if left uncontrolled; Educated on [place] management, monitoring CBG twice daily before meals and logging BS; Educated on daily glycemic control FBS 80-130; <180 after meals; Educated on importance of implementing exercise to daily routine, maintaining healthy weight and adhering to diabetic diet: Determined patient has received this information from a Registered dietician in the past but did not follow through; Determined he is currently not chekcing his CBG's but has a working glucometer and is comfortable in doing so: Discussed patient will begin Self monitoring CBG's and logging twice daily before meals and will review with CCM RN CM at next call   . Reviewed medications with patient and discussed indication, dosage and frequency of prescribed medications for diabetes: Determined patient understands his regimen and is adhering, denies financial hardship . Collaborated with PCP via in basket message  regarding need for new Rx for Accucheck glucose strips to be sent to [facility] in [place]  . Discussed plans with patient for ongoing care  management follow up and provided patient with direct contact information for care management team . Provided patient with printed educational materials related to Diabetes Management using Meal Planning; Diabetes Management Zone Tool; Carb Counting; Carb Choices: BS log . Advised patient, providing education and rationale, to check cbg 2-3 times daily before meals and record, calling the CCM RN CM and or PCP for findings outside established parameters.    Patient Self Care Activities:  . Self administers medications as prescribed . Attends all scheduled provider appointments . Calls pharmacy for medication refills . Performs ADL's independently . Performs IADL's independently . Calls provider office for new concerns or questions  Initial  goal documentation       Other   . COMPLETED: Assist with Chronic Care Management and Care Coordination needs        CARE PLAN ENTRY (see longtitudinal plan of care for additional care plan information)   Current Barriers:  . Chronic Disease Management support, education, and care coordination needs related to HTN, HLD, DMII, and CKD Stage 3 a  Case Manager Clinical Goal(s):  Robert Walters Over the next 30 days, patient will work with the CCM team to address needs related to chronic disease education and support in patient with HTN, HLD, DMII, and CKD Stage 3 a   Interventions:  . Collaborated with BSW to initiate plan of care to address needs related to Chronic Care Management and Care Coordination needs in patient with HTN, HLD, DMII, and CKD Stage 3 a  Patient Self Care Activities:  . Self administers medications as prescribed . Attends all scheduled provider appointments . Calls pharmacy for medication refills . Calls provider office for new concerns or questions  Initial goal documentation        Plan:   Telephone follow up appointment with care management team member scheduled for: 08/31/19  Barb Merino, RN, BSN, CCM Care Management Coordinator [facility]  Direct Phone: [phone]

## 2019-08-22 ENCOUNTER — Other Ambulatory Visit: Payer: Self-pay

## 2019-08-22 MED ORDER — GLUCOSE BLOOD VI STRP: ORAL_STRIP | 2 refills | Status: DC

## 2019-08-23 ENCOUNTER — Encounter: Payer: Self-pay | Admitting: Nurse Practitioner

## 2019-08-28 ENCOUNTER — Telehealth: Payer: Self-pay

## 2019-08-28 ENCOUNTER — Ambulatory Visit: Payer: Self-pay

## 2019-08-28 DIAGNOSIS — I10 Essential (primary) hypertension: Secondary | ICD-10-CM

## 2019-08-28 DIAGNOSIS — E119 Type 2 diabetes mellitus without complications: Secondary | ICD-10-CM

## 2019-08-28 NOTE — Chronic Care Management (AMB) (Signed)
Chronic Care Management    Social Work Follow Up Note  08/28/2019 Name: Robert Walters MRN: [id number] DOB: 02/07/1956  Robert Walters is a 64 y.o. year old male who is a primary care patient of [name], [place]. The CCM team was consulted for assistance with care coordination.   Review of patient status, including review of consultants reports, other relevant assessments, and collaboration with appropriate care team members and the patient's provider was performed as part of comprehensive patient evaluation and provision of chronic care management services.    SDOH (Social Determinants of Health) assessments performed: No    Outpatient Encounter Medications as of 08/28/2019  Medication Sig  . alfuzosin (UROXATRAL) 10 MG 24 hr tablet Take 1 tablet (10 mg total) by mouth daily.  [name] alfuzosin (UROXATRAL) 10 MG 24 hr tablet Take 1 tablet (10 mg total) by mouth 2 (two) times daily.  . Ascorbic Acid (VITAMIN C) 1000 MG tablet Take 1,000 mg by mouth daily.  [name] aspirin EC 81 MG tablet Take 1 tablet (81 mg total) by mouth daily.  [name] atorvastatin (LIPITOR) 40 MG tablet Take 1 tablet (40 mg total) by mouth at bedtime.  [name] b complex vitamins tablet Take 1 tablet by mouth daily.  . Cholecalciferol (VITAMIN D) 2000 UNITS CAPS Take 1 capsule by mouth 3 (three) times daily.   [name] CINNAMON PO Take 1 tablet by mouth daily.  . Coenzyme Q10 (COQ10 PO) Take 1 tablet by mouth daily.  . empagliflozin (JARDIANCE) 10 MG TABS tablet Take 10 mg by mouth daily before breakfast.  . FLUoxetine (PROZAC) 40 MG capsule Take 1 capsule (40 mg total) by mouth every morning.  . Garlic 123XX123 MG TABS Take by mouth.  [name] glipiZIDE (GLUCOTROL) 10 MG tablet Take 1 tablet (10 mg total) by mouth daily.  [name] glucose blood test strip Use to check blood sugars daily E11.65  . lamoTRIgine (LAMICTAL) 150 MG tablet Take 1 tablet (150 mg total) by mouth 2 (two) times daily.  [name] LORazepam (ATIVAN) 1 MG tablet TAKE 1 TABLET BY MOUTH THREE TIMES  DAILY AS NEEDED FOR ANXIETY. GENERIC EQUIVALENT FOR ATIVAN.  [name] losartan (COZAAR) 100 MG tablet TAKE 1 TABLET BY MOUTH DAILY.  . metFORMIN (GLUCOPHAGE) 1000 MG tablet Take 1 tablet (1,000 mg total) by mouth daily with breakfast.  . metoprolol tartrate (LOPRESSOR) 25 MG tablet TAKE 1 TABLET BY MOUTH 2 TIMES DAILY. GENERIC EQUIVALENT FOR LOPRESSOR.  . Multiple Vitamin (MULTIVITAMIN WITH MINERALS) TABS tablet Take 1 tablet by mouth daily.  [name] omeprazole (PRILOSEC) 40 MG capsule TAKE 1 CAPSULE BY MOUTH 20 MINUTES BEFORE BREAKFAST.   No facility-administered encounter medications on file as of 08/28/2019.     Goals Addressed            This Visit's Progress   . COMPLETED: Collaborate with RN Care Manager to perform appropriate assessments to determine care management and care coordination needs       CARE PLAN ENTRY (see longtitudinal plan of care for additional care plan information)   Current Barriers:  [name] Knowledge Barriers related to resources and support available to address needs related to  DM II  Social Worker Clinical Goal(s):  [name] Over the next 60 days the patient will work the care management team to become more knowledgeable of how to better manage Diabetes through diet and medication adherence.  CCM SW Interventions:  Completed 08/28/2019 . Performed chart review to note patient is engaged with Consulting civil engineer . Unsuccessful  outbound call placed to the patient to screen for acute resource needs . Voice message left requesting a return call as needed  Completed 07/16/2019 . Patient interviewed and appropriate assessments performed . Performed chart review to note recent A1C at 9.9 o Patient unclear why A1C continues to rise and states "I have just decided to stop eating sugar" . Discussed importance of water intake and daily exercise routine . Determined the patient is interested in working with RN Care Manager to better manage DM II in hopes of avoiding needing  insulin . Collaborated with RN Care Manager regarding patient enrollment and desire to work on management of DM II . Advised the patient to expect a call from Grenelefe over the next month   Patient Self Care Activities:  . Patient verbalizes understanding of plan to work with embedded care management team to assist with disease management and care coordination . Self administers medications as prescribed . Attends all scheduled provider appointments . Performs ADL's independently . Performs IADL's independently . Calls provider office for new concerns or questions  Please see past updates related to this goal by clicking on the "Past Updates" button in the selected goal          Follow Up Plan: No SW follow up planned at this time. The patient will remain engaged with RN Care Manager.   Daneen Schick, BSW, CDP Social Worker, Certified Dementia Practitioner [facility] [phone]

## 2019-08-30 ENCOUNTER — Encounter: Payer: Self-pay | Admitting: Nurse Practitioner

## 2019-08-31 ENCOUNTER — Telehealth: Payer: Self-pay

## 2019-09-04 ENCOUNTER — Ambulatory Visit: Payer: Medicare Other | Admitting: Urology

## 2019-09-21 ENCOUNTER — Other Ambulatory Visit (INDEPENDENT_AMBULATORY_CARE_PROVIDER_SITE_OTHER): Payer: Self-pay | Admitting: Gastroenterology

## 2019-09-24 ENCOUNTER — Other Ambulatory Visit: Payer: Self-pay | Admitting: Nurse Practitioner

## 2019-09-24 DIAGNOSIS — E119 Type 2 diabetes mellitus without complications: Secondary | ICD-10-CM

## 2019-09-27 ENCOUNTER — Ambulatory Visit: Payer: Medicare Other | Admitting: Cardiovascular Disease

## 2019-10-12 ENCOUNTER — Telehealth: Payer: Self-pay

## 2019-10-22 ENCOUNTER — Other Ambulatory Visit: Payer: Self-pay

## 2019-10-22 ENCOUNTER — Ambulatory Visit: Payer: Medicare Other | Admitting: Cardiovascular Disease

## 2019-10-22 ENCOUNTER — Encounter: Payer: Self-pay | Admitting: Cardiovascular Disease

## 2019-10-22 VITALS — BP 90/58 | HR 92 | Ht 69.0 in | Wt 223.0 lb

## 2019-10-22 DIAGNOSIS — I5032 Chronic diastolic (congestive) heart failure: Secondary | ICD-10-CM

## 2019-10-22 DIAGNOSIS — I10 Essential (primary) hypertension: Secondary | ICD-10-CM

## 2019-10-22 NOTE — Progress Notes (Signed)
Cardiology Office Note   Date:  10/22/2019   ID:  CAMMERON GREIS, DOB December 11, 1955, MRN [id number]  PCP:  [name], FNP  Cardiologist:   Mertie Moores, MD   Chief Complaint  Patient presents with  . Congestive Heart Failure  . Hypertension  . Hyperlipidemia   Problem List: 1. Hypertension 2. Hyperlipidemia 3.  Chronic diastolic congestive heart failure 4. Obesity 5. GERD 6.   Diabetes mellitus - with gastroparesis  Feb. 9, 2016:  BRADEN CIMO is a 64 y.o. male who presents for follow up of his HTN . He also has a history of hyperlipidemia that is controlled and monitored by his medical doctor.  He does not eat restricted diet. He does not get any regular exercise. He's had some issues with depression and spends a lot of time in bed.  Sept. 21, 2017:  Kathreen Cosier is seen back today for Further eval Had a stroke in March, was admitted to [facility] Echo showed normal LV function .  Mild MR  Still eats lots of salt .   Does not exercise.   May 19, 2017:  Audry Pili is seen today for follow-up visit.  He has a history of chronic diastolic congestive heart failure. Had gall bladder surgery this past year.  Breathing is ok.   Still eats salt.    Exercises only a little .   Is depressed.  Eats popcorn regularly   October 22, 2019:  Audry Pili is seen for follow up  Wt is 223 today .  No cp or dyspnea.  Has cut back on his salt intake  No cp or dyspnea .  BP is on the low side today  BP is usually in the 130 - 140 range   Past Medical History:  Diagnosis Date  . Acid reflux disease   . Bipolar 1 disorder, mixed ([place])   . BPH (benign prostatic hypertrophy)   . CVA (cerebral vascular accident) ([place]) 07/2015  . Gastritis   . H/O hiatal hernia   . Hyperlipidemia   . Hypertension   . Normal nuclear stress test Dec 2011  . Obesity   . TIA (transient ischemic attack) 05/15/2012   "they think I've had one this am @ 0130" (05/15/2012)  . Type II diabetes  mellitus ([place])   . Vertigo     Past Surgical History:  Procedure Laterality Date  . BILIARY STENT PLACEMENT N/A 09/17/2016   Procedure: BILIARY STENT PLACEMENT;  Surgeon: [name], MD;  Location: [facility];  Service: Endoscopy;  Laterality: N/A;  . CARDIOVASCULAR STRESS TEST  2009   EF 63%  . CHOLECYSTECTOMY N/A 08/25/2016   Procedure: LAPAROSCOPIC CHOLECYSTECTOMY;  Surgeon: [name], MD;  Location: [facility] ORS;  Service: General;  Laterality: N/A;  . COLONOSCOPY    . ERCP N/A 09/17/2016   Procedure: ENDOSCOPIC RETROGRADE CHOLANGIOPANCREATOGRAPHY (ERCP);  Surgeon: [name], MD;  Location: [facility] END[facility];  Service: Endoscopy;  Laterality: N/A;  730  . ERCP N/A 12/03/2016   Procedure: ENDOSCOPIC RETROGRADE CHOLANGIOPANCREATOGRAPHY (ERCP);  Surgeon: [name], MD;  Location: [facility];  Service: Gastroenterology;  Laterality: N/A;  . ESOPHAGOGASTRODUODENOSCOPY    . GASTROINTESTINAL STENT REMOVAL N/A 12/03/2016   Procedure: BILIARY STENT REMOVAL;  Surgeon: [name], MD;  Location: [facility];  Service: Gastroenterology;  Laterality: N/A;  . NO PAST SURGERIES    . SPHINCTEROTOMY N/A 09/17/2016   Procedure: SPHINCTEROTOMY;  Surgeon: [name], MD;  Location:  [facility];  Service: Endoscopy;  Laterality: N/A;     Current Outpatient Medications  Medication Sig Dispense Refill  . alfuzosin (UROXATRAL) 10 MG 24 hr tablet Take 1 tablet (10 mg total) by mouth daily. 30 tablet 2  . Ascorbic Acid (VITAMIN C) 1000 MG tablet Take 1,000 mg by mouth daily.    [name] aspirin EC 81 MG tablet Take 1 tablet (81 mg total) by mouth daily.    [name] atorvastatin (LIPITOR) 40 MG tablet TAKE 1 TABLET BY MOUTH AT BEDTIME 90 tablet 1  . b complex vitamins tablet Take 1 tablet by mouth daily.    . Cholecalciferol (VITAMIN D) 2000 UNITS CAPS Take 1 capsule by mouth 3 (three) times daily.     [name] CINNAMON PO Take 1 tablet by mouth daily.    . Coenzyme Q10 (COQ10 PO) Take 1 tablet by  mouth daily.    . empagliflozin (JARDIANCE) 10 MG TABS tablet Take 10 mg by mouth daily before breakfast. 90 tablet 1  . FLUoxetine (PROZAC) 40 MG capsule Take 1 capsule (40 mg total) by mouth every morning. 90 capsule 1  . Garlic 865 MG TABS Take by mouth.    [name] glipiZIDE (GLUCOTROL) 10 MG tablet TAKE 1 TABLET BY MOUTH EVERY DAY 90 tablet 1  . glucose blood test strip Use to check blood sugars daily E11.65 100 each 2  . lamoTRIgine (LAMICTAL) 150 MG tablet Take 1 tablet (150 mg total) by mouth 2 (two) times daily. 180 tablet 1  . LORazepam (ATIVAN) 1 MG tablet TAKE 1 TABLET BY MOUTH THREE TIMES DAILY AS NEEDED FOR ANXIETY. GENERIC EQUIVALENT FOR ATIVAN. 270 tablet 1  . losartan (COZAAR) 100 MG tablet TAKE 1 TABLET BY MOUTH DAILY. 90 tablet 2  . metFORMIN (GLUCOPHAGE) 1000 MG tablet Take 1 tablet (1,000 mg total) by mouth daily with breakfast. 90 tablet 3  . metoprolol tartrate (LOPRESSOR) 25 MG tablet TAKE 1 TABLET BY MOUTH 2 TIMES DAILY. GENERIC EQUIVALENT FOR LOPRESSOR. 180 tablet 2  . Multiple Vitamin (MULTIVITAMIN WITH MINERALS) TABS tablet Take 1 tablet by mouth daily.    [name] omeprazole (PRILOSEC) 40 MG capsule TAKE 1 CAPSULE BY MOUTH 20 MINUTES BEFORE BREAKFAST. 90 capsule 0   No current facility-administered medications for this visit.    Allergies:   Patient has no known allergies.    Social History:  The patient  reports that he has never smoked. He has never used smokeless tobacco. He reports that he does not drink alcohol and does not use drugs.   Family History:  The patient's family history includes Colon cancer in his father; Hyperlipidemia in his father; Stroke (age of onset: 64) in his mother.    ROS:   Physical Exam: Blood pressure (!) 90/58, pulse 92, height 5\' 9"  (1.753 m), weight 223 lb (101.2 kg), SpO2 97 %.  GEN:  Well nourished, well developed in no acute distress HEENT: Normal NECK: No JVD; No carotid bruits LYMPHATICS: No lymphadenopathy CARDIAC: RRR    RESPIRATORY:  Clear to auscultation without rales, wheezing or rhonchi  ABDOMEN: Soft, non-tender, non-distended MUSCULOSKELETAL:  No edema; No deformity  SKIN: Warm and dry NEUROLOGIC:  Alert and oriented x 3    EKG:     Recent Labs: 11/29/2018: Hemoglobin 12.8; Platelets 161; TSH 2.700 07/04/2019: ALT 79; BUN 19; Creatinine, Ser 1.38; Potassium 5.1; Sodium 136    Lipid Panel    Component Value Date/Time   CHOL 228 (H) 07/04/2019 1233   TRIG  476 (H) 07/04/2019 1233   HDL 40 07/04/2019 1233   CHOLHDL 5.7 (H) 07/04/2019 1233   CHOLHDL 7.1 07/26/2015 0647   VLDL UNABLE TO CALCULATE IF TRIGLYCERIDE OVER 400 mg/dL 07/26/2015 0647   LDLCALC 107 (H) 07/04/2019 1233   LDLDIRECT 124.3 11/10/2010 0953      Wt Readings from Last 3 Encounters:  10/22/19 223 lb (101.2 kg)  07/04/19 230 lb (104.3 kg)  07/03/19 229 lb (103.9 kg)      Other studies Reviewed: Additional studies/ records that were reviewed today include:. Review of the above records demonstrates:    ASSESSMENT AND PLAN:  Problem List: 1. Hypertension -     his blood pressure is actually a little on the low side today.  He insist that his blood pressure is typically in the 1 30-1 40 range.  As such, we will continue with his current medications.  If he cuts back on his salt notices that his blood pressures generally running low then we will cut out some of his medications.  2. Hyperlipidemia -is typically followed by his primary medical doctor.  3. Diabetes mellitus -followed by his general medical doctor.  5. Obesity -I advised him to work on weight loss.  6. CVA -     Current medicines are reviewed at length with the patient today.  The patient does not have concerns regarding medicines.  The following changes have been made:  no change   Disposition:   FU with APP  in 1 year     Signed, Mertie Moores, MD  10/22/2019 4:25 PM    [facility] [place], [place], [place]   [address] Phone: [phone]; Fax: [phone]

## 2019-10-22 NOTE — Patient Instructions (Signed)
Medication Instructions:  Your physician recommends that you continue on your current medications as directed. Please refer to the Current Medication list given to you today.  *If you need a refill on your cardiac medications before your next appointment, please call your pharmacy*   Lab Work: None Ordered If you have labs (blood work) drawn today and your tests are completely normal, you will receive your results only by: [name] MyChart Message (if you have MyChart) OR . A paper copy in the mail If you have any lab test that is abnormal or we need to change your treatment, we will call you to review the results.   Testing/Procedures: None Ordered   Follow-Up: At [facility], you and your health needs are our priority.  As part of our continuing mission to provide you with exceptional heart care, we have created designated Provider Care Teams.  These Care Teams include your primary Cardiologist (physician) and Advanced Practice Providers (APPs -  Physician Assistants and Nurse Practitioners) who all work together to provide you with the care you need, when you need it.  We recommend signing up for the patient portal called "MyChart".  Sign up information is provided on this After Visit Summary.  MyChart is used to connect with patients for Virtual Visits (Telemedicine).  Patients are able to view lab/test results, encounter notes, upcoming appointments, etc.  Non-urgent messages can be sent to your provider as well.   To learn more about what you can do with MyChart, go to [email or web address].    Your next appointment:   6 month(s)  The format for your next appointment:   In Person  Provider:   Richardson Dopp, PA-C or Robbie Lis, PA-C

## 2019-10-26 ENCOUNTER — Other Ambulatory Visit: Payer: Self-pay

## 2019-10-26 MED ORDER — LOSARTAN POTASSIUM 100 MG PO TABS: 100.0000 mg | ORAL_TABLET | Freq: Every day | ORAL | 3 refills | Status: DC

## 2019-11-05 DIAGNOSIS — I739 Peripheral vascular disease, unspecified: Secondary | ICD-10-CM | POA: Diagnosis not present

## 2019-11-06 ENCOUNTER — Other Ambulatory Visit: Payer: Self-pay

## 2019-11-06 ENCOUNTER — Encounter: Payer: Self-pay | Admitting: Nurse Practitioner

## 2019-11-06 ENCOUNTER — Ambulatory Visit (INDEPENDENT_AMBULATORY_CARE_PROVIDER_SITE_OTHER): Payer: Medicare Other | Admitting: Nurse Practitioner

## 2019-11-06 VITALS — BP 136/84 | HR 87 | Temp 98.2°F | Ht 69.0 in | Wt 224.0 lb

## 2019-11-06 DIAGNOSIS — N1831 Chronic kidney disease, stage 3a: Secondary | ICD-10-CM | POA: Diagnosis not present

## 2019-11-06 DIAGNOSIS — Z23 Encounter for immunization: Secondary | ICD-10-CM

## 2019-11-06 DIAGNOSIS — I10 Essential (primary) hypertension: Secondary | ICD-10-CM | POA: Diagnosis not present

## 2019-11-06 DIAGNOSIS — Z7982 Long term (current) use of aspirin: Secondary | ICD-10-CM

## 2019-11-06 DIAGNOSIS — N3941 Urge incontinence: Secondary | ICD-10-CM

## 2019-11-06 DIAGNOSIS — E1165 Type 2 diabetes mellitus with hyperglycemia: Secondary | ICD-10-CM

## 2019-11-06 DIAGNOSIS — E782 Mixed hyperlipidemia: Secondary | ICD-10-CM

## 2019-11-06 LAB — POCT UA - MICROALBUMIN
Albumin/Creatinine Ratio, Urine, POC: 30
Creatinine, POC: 50 mg/dL
Microalbumin Ur, POC: 30 mg/L

## 2019-11-06 LAB — CMP14+EGFR
ALT: 47 IU/L — ABNORMAL HIGH (ref 0–44)
AST: 36 IU/L (ref 0–40)
Albumin/Globulin Ratio: 1.8 (ref 1.2–2.2)
Albumin: 4.4 g/dL (ref 3.8–4.8)
Alkaline Phosphatase: 167 IU/L — ABNORMAL HIGH (ref 48–121)
BUN/Creatinine Ratio: 13 (ref 10–24)
BUN: 19 mg/dL (ref 8–27)
Bilirubin Total: 0.5 mg/dL (ref 0.0–1.2)
CO2: 20 mmol/L (ref 20–29)
Calcium: 9.7 mg/dL (ref 8.6–10.2)
Chloride: 98 mmol/L (ref 96–106)
Creatinine, Ser: 1.49 mg/dL — ABNORMAL HIGH (ref 0.76–1.27)
GFR calc Af Amer: 56 mL/min/{1.73_m2} — ABNORMAL LOW (ref >59)
GFR calc non Af Amer: 49 mL/min/{1.73_m2} — ABNORMAL LOW (ref >59)
Globulin, Total: 2.5 g/dL (ref 1.5–4.5)
Glucose: 281 mg/dL — ABNORMAL HIGH (ref 65–99)
Potassium: 5.2 mmol/L (ref 3.5–5.2)
Sodium: 136 mmol/L (ref 134–144)
Total Protein: 6.9 g/dL (ref 6.0–8.5)

## 2019-11-06 LAB — LIPID PANEL
Chol/HDL Ratio: 6.1 ratio — ABNORMAL HIGH (ref 0.0–5.0)
Cholesterol, Total: 237 mg/dL — ABNORMAL HIGH (ref 100–199)
HDL: 39 mg/dL — ABNORMAL LOW (ref >39)
LDL Chol Calc (NIH): 110 mg/dL — ABNORMAL HIGH (ref 0–99)
Triglycerides: 511 mg/dL — ABNORMAL HIGH (ref 0–149)
VLDL Cholesterol Cal: 88 mg/dL — ABNORMAL HIGH (ref 5–40)

## 2019-11-06 LAB — POCT URINALYSIS DIPSTICK
Bilirubin, UA: NEGATIVE
Blood, UA: NEGATIVE
Glucose, UA: POSITIVE — AB
Ketones, UA: NEGATIVE
Leukocytes, UA: NEGATIVE
Nitrite, UA: NEGATIVE
Protein, UA: NEGATIVE
Spec Grav, UA: 1.02 (ref 1.010–1.025)
Urobilinogen, UA: 0.2 E.U./dL
pH, UA: 5.5 (ref 5.0–8.0)

## 2019-11-06 LAB — HEMOGLOBIN A1C
Est. average glucose Bld gHb Est-mCnc: 260 mg/dL
Hgb A1c MFr Bld: 10.7 % — ABNORMAL HIGH (ref 4.8–5.6)

## 2019-11-06 MED ORDER — RYBELSUS 7 MG PO TABS: 1.0000 | ORAL_TABLET | Freq: Every day | ORAL | 2 refills | Status: DC

## 2019-11-06 MED ORDER — GLUCOSE BLOOD VI STRP: ORAL_STRIP | 3 refills | Status: DC

## 2019-11-06 MED ORDER — TETANUS-DIPHTH-ACELL PERTUSSIS 5-2.5-18.5 LF-MCG/0.5 IM SUSP: 0.5000 mL | Freq: Once | INTRAMUSCULAR | 0 refills | Status: AC

## 2019-11-06 NOTE — Progress Notes (Signed)
This visit occurred during the SARS-CoV-2 public health emergency.  Safety protocols were in place, including screening questions prior to the visit, additional usage of staff PPE, and extensive cleaning of exam room while observing appropriate contact time as indicated for disinfecting solutions.  Subjective:     Patient ID: Robert Walters , male    DOB: 1956/04/12 , 64 y.o.   MRN: [id number]   Chief Complaint  Patient presents with  . Diabetes    HPI   Urologist yesterday for frequency - increased his aflozosin  Diabetes He presents for his follow-up diabetic visit. He has type 2 diabetes mellitus. His disease course has been stable. There are no hypoglycemic associated symptoms. There are no diabetic associated symptoms. Pertinent negatives for diabetes include no chest pain, no polydipsia, no polyphagia and no polyuria. There are no hypoglycemic complications. Symptoms are stable. Diabetic complications include heart disease. Risk factors for coronary artery disease include diabetes mellitus, sedentary lifestyle, male sex and obesity. Current diabetic treatment includes oral agent (monotherapy). He is compliant with treatment all of the time. He is following a generally healthy diet. He has not had a previous visit with a dietitian. He rarely participates in exercise. (He is not checking his blood sugar at home. )  Hypertension This is a chronic problem. The current episode started more than 1 year ago. The problem is unchanged. The problem is controlled. Pertinent negatives include no anxiety, chest pain or palpitations. There are no associated agents to hypertension. Risk factors for coronary artery disease include obesity and sedentary lifestyle. There are no compliance problems.      Past Medical History:  Diagnosis Date  . Acid reflux disease   . Bipolar 1 disorder, mixed ([place])   . BPH (benign prostatic hypertrophy)   . CVA (cerebral vascular accident) ([place]) 07/2015  . Gastritis    . H/O hiatal hernia   . Hyperlipidemia   . Hypertension   . Normal nuclear stress test Dec 2011  . Obesity   . TIA (transient ischemic attack) 05/15/2012   "they think I've had one this am @ 0130" (05/15/2012)  . Type II diabetes mellitus ([place])   . Vertigo      Family History  Problem Relation Age of Onset  . Hyperlipidemia Father        in a nursing home  . Colon cancer Father   . Stroke Mother 41     Current Outpatient Medications:  .  alfuzosin (UROXATRAL) 10 MG 24 hr tablet, Take 1 tablet (10 mg total) by mouth daily., Disp: 30 tablet, Rfl: 2 .  Ascorbic Acid (VITAMIN C) 1000 MG tablet, Take 1,000 mg by mouth daily., Disp: , Rfl:  .  aspirin EC 81 MG tablet, Take 1 tablet (81 mg total) by mouth daily., Disp: , Rfl:  .  atorvastatin (LIPITOR) 40 MG tablet, TAKE 1 TABLET BY MOUTH AT BEDTIME, Disp: 90 tablet, Rfl: 1 .  b complex vitamins tablet, Take 1 tablet by mouth daily., Disp: , Rfl:  .  Cholecalciferol (VITAMIN D) 2000 UNITS CAPS, Take 1 capsule by mouth 3 (three) times daily. , Disp: , Rfl:  .  CINNAMON PO, Take 1 tablet by mouth daily., Disp: , Rfl:  .  Coenzyme Q10 (COQ10 PO), Take 1 tablet by mouth daily., Disp: , Rfl:  .  empagliflozin (JARDIANCE) 10 MG TABS tablet, Take 10 mg by mouth daily before breakfast., Disp: 90 tablet, Rfl: 1 .  FLUoxetine (PROZAC) 40 MG capsule, Take 1  capsule (40 mg total) by mouth every morning., Disp: 90 capsule, Rfl: 1 .  Garlic 174 MG TABS, Take by mouth., Disp: , Rfl:  .  glipiZIDE (GLUCOTROL) 10 MG tablet, TAKE 1 TABLET BY MOUTH EVERY DAY, Disp: 90 tablet, Rfl: 1 .  glucose blood test strip, Use to check blood sugars daily E11.65, Disp: 100 each, Rfl: 2 .  lamoTRIgine (LAMICTAL) 150 MG tablet, Take 1 tablet (150 mg total) by mouth 2 (two) times daily., Disp: 180 tablet, Rfl: 1 .  LORazepam (ATIVAN) 1 MG tablet, TAKE 1 TABLET BY MOUTH THREE TIMES DAILY AS NEEDED FOR ANXIETY. GENERIC EQUIVALENT FOR ATIVAN., Disp: 270 tablet, Rfl: 1 .   metoprolol tartrate (LOPRESSOR) 25 MG tablet, TAKE 1 TABLET BY MOUTH 2 TIMES DAILY. GENERIC EQUIVALENT FOR LOPRESSOR., Disp: 180 tablet, Rfl: 2 .  Multiple Vitamin (MULTIVITAMIN WITH MINERALS) TABS tablet, Take 1 tablet by mouth daily., Disp: , Rfl:  .  omeprazole (PRILOSEC) 40 MG capsule, TAKE 1 CAPSULE BY MOUTH 20 MINUTES BEFORE BREAKFAST., Disp: 90 capsule, Rfl: 0 .  losartan (COZAAR) 100 MG tablet, Take 1 tablet (100 mg total) by mouth daily., Disp: 90 tablet, Rfl: 3 .  metFORMIN (GLUCOPHAGE) 1000 MG tablet, Take 1 tablet (1,000 mg total) by mouth daily with breakfast., Disp: 90 tablet, Rfl: 3   No Known Allergies   Review of Systems  Constitutional: Negative.   Respiratory: Negative.  Negative for cough.   Cardiovascular: Negative.  Negative for chest pain, palpitations and leg swelling.  Endocrine: Negative for polydipsia, polyphagia and polyuria.  Genitourinary: Positive for urgency. Negative for difficulty urinating and testicular pain.  Neurological: Negative.   Psychiatric/Behavioral: Negative.      Today's Vitals   11/06/19 0956  BP: 136/84  Pulse: 87  Temp: 98.2 F (36.8 C)  TempSrc: Oral  Weight: 224 lb (101.6 kg)  Height: 5\' 9"  (1.753 m)  PainSc: 0-No pain   Body mass index is 33.08 kg/m.   Objective:  Physical Exam Constitutional:      General: He is not in acute distress.    Appearance: Normal appearance. He is well-developed. He is obese.  Eyes:     Extraocular Movements: Extraocular movements intact.     Conjunctiva/sclera: Conjunctivae normal.     Pupils: Pupils are equal, round, and reactive to light.  Neck:     Thyroid: No thyromegaly.     Vascular: No carotid bruit.  Cardiovascular:     Rate and Rhythm: Normal rate and regular rhythm.     Pulses: Normal pulses.     Heart sounds: Normal heart sounds, S1 normal and S2 normal. No murmur heard.   Pulmonary:     Effort: Pulmonary effort is normal. No respiratory distress.     Breath sounds: Normal  breath sounds.  Musculoskeletal:     Cervical back: Full passive range of motion without pain, normal range of motion and neck supple. No edema.  Skin:    General: Skin is warm and dry.     Capillary Refill: Capillary refill takes less than 2 seconds.  Neurological:     General: No focal deficit present.     Mental Status: He is alert and oriented to person, place, and time.  Psychiatric:        Mood and Affect: Mood normal.        Speech: Speech normal.        Behavior: Behavior normal.        Thought Content: Thought content normal.  Judgment: Judgment normal.         Assessment And Plan:     1. Uncontrolled type 2 diabetes mellitus with hyperglycemia ([place])   He is to hold his glipizide I will be starting him on Rybelsus pending insurance approval. He is not interested in an injection at this time  I have discussed the importance of a healthy diet low in sugar and carbohydrates. I have also advised him to check his blood sugar daily  - glucose blood test strip; Use to check blood sugars daily E11.65  Dispense: 150 each; Refill: 3 - Semaglutide (RYBELSUS) 7 MG TABS; Take 1 tablet by mouth daily.  Dispense: 30 tablet; Refill: 2  2. Stage 3a chronic kidney disease    3. Essential hypertension . B/P is fairly controlled.  . CMP ordered to check renal function.  . The importance of regular exercise and dietary modification was stressed to the patient.  . Stressed importance of losing ten percent of her body weight to help with B/P control.   4. Mixed hyperlipidemia  Chronic, controlled  Continue with current medications  5. Encounter for immunization  I have sent his TDAP to the pharmacy and advised him to not get the shingles vaccine at the same time. To be administered to adults 71-4 years old every 10 years. - Tdap (BOOSTRIX) 5-2.5-18.5 LF-MCG/0.5 injection; Inject 0.5 mLs into the muscle once for 1 dose.  Dispense: 0.5 mL; Refill: 0   6. Urge incontinence of  urine  Continue your follow up with Urology  [name], FNP    THE PATIENT IS ENCOURAGED TO PRACTICE SOCIAL DISTANCING DUE TO THE COVID-19 PANDEMIC.

## 2019-11-09 ENCOUNTER — Other Ambulatory Visit: Payer: Self-pay

## 2019-11-09 DIAGNOSIS — E1165 Type 2 diabetes mellitus with hyperglycemia: Secondary | ICD-10-CM

## 2019-11-09 MED ORDER — GLUCOSE BLOOD VI STRP: ORAL_STRIP | 3 refills | Status: DC

## 2019-11-14 ENCOUNTER — Telehealth: Payer: Self-pay

## 2019-11-15 ENCOUNTER — Telehealth: Payer: Self-pay

## 2019-11-15 NOTE — Telephone Encounter (Signed)
Spoke with pt per JM keep taking the Rybelsus samples that was given and she will speak w/Pharmacist Loma Sousa about the cost

## 2019-11-19 ENCOUNTER — Other Ambulatory Visit: Payer: Self-pay

## 2019-11-19 ENCOUNTER — Ambulatory Visit (INDEPENDENT_AMBULATORY_CARE_PROVIDER_SITE_OTHER): Payer: Medicare Other | Admitting: Gastroenterology

## 2019-11-19 ENCOUNTER — Encounter (INDEPENDENT_AMBULATORY_CARE_PROVIDER_SITE_OTHER): Payer: Self-pay | Admitting: Gastroenterology

## 2019-11-19 VITALS — BP 146/63 | HR 87 | Temp 98.4°F | Ht 69.0 in | Wt 216.0 lb

## 2019-11-19 DIAGNOSIS — R1310 Dysphagia, unspecified: Secondary | ICD-10-CM | POA: Diagnosis not present

## 2019-11-19 DIAGNOSIS — R7989 Other specified abnormal findings of blood chemistry: Secondary | ICD-10-CM | POA: Diagnosis not present

## 2019-11-19 DIAGNOSIS — R1319 Other dysphagia: Secondary | ICD-10-CM

## 2019-11-19 DIAGNOSIS — K219 Gastro-esophageal reflux disease without esophagitis: Secondary | ICD-10-CM

## 2019-11-19 NOTE — Progress Notes (Cosign Needed)
Patient profile: Robert Walters is a 64 y.o. male seen for f/up of hx of colon polyps, fatty liver. PMHx of complicated CCY in 5784 with empyema of gallbladder w/ gangrene, bilary drain placed, ERCP w/ cystic duct remnant leak, had small sphincterotomy w/ bilary stent placed.  History of Present Illness: Robert Walters is seen today and reports feeling well. He denies nausea/vomiting. He is on omeprazole 63m once a day which controls GERD well, was very symptomatic at night prior to starting PPI. He does endorse some mild dysphagia in upper esophageal area-mainly to hamburger-occurs on average 1x/month. No pill or liquid dysphagia. Dysphagia ongoing about a year total. Sometimes dysphagia so severe has to stop eating.   Reports BM daily - no constipation/diarrhea. No blood in stool. No abd pain. Does have some occasional bloating.   Has lost weight -states he  weighed #259 1 year ago-per chart review shows he was actually about 229. He reports "not really trying to loose weight but I should". Does endorse some early satiety.  He has diabetes which he has been trying to control better  Wt Readings from Last 3 Encounters:  11/19/19 216 lb (98 kg)  11/06/19 224 lb (101.6 kg)  10/22/19 223 lb (101.2 kg)  12/2018-#229   03/2018-#226   Last Colonoscopy: 01/2018-family history of colon cancer in father with personal history of colon polyps, multiple polyps, 6 mm polyp rectum, 3 polyps mid sigmoid, 10 mm polyp mid ascending, sessile polyp cecum, polyp ascending x2.  Path with tubular adenomas, 3-year repeat recommended.    Last Endoscopy: 2019-moderate diffuse gastritis.   Past Medical History:  Past Medical History:  Diagnosis Date  . Acid reflux disease   . Bipolar 1 disorder, mixed ([place]   . BPH (benign prostatic hypertrophy)   . CVA (cerebral vascular accident) ([facility] 07/2015  . Gastritis   . H/O hiatal hernia   . Hyperlipidemia   . Hypertension   . Normal nuclear stress test Dec 2011   . Obesity   . TIA (transient ischemic attack) 05/15/2012   "they think I've had one this am @ 0130" (05/15/2012)  . Type II diabetes mellitus ([place]   . Vertigo     Problem List: Patient Active Problem List   Diagnosis Date Noted  . Chronic diastolic heart failure ([facility] 03/27/2019  . Fatty liver 12/26/2018  . Elevated LFTs 12/26/2018  . History of colonic polyps 12/26/2018  . Fall 08/28/2018  . Rib pain on left side 08/28/2018  . Anxiety 03/31/2018  . Bipolar II disorder ([facility] 03/31/2018  . Insomnia 03/31/2018  . Type 2 diabetes mellitus without complication, without long-term current use of insulin ([facility] [place] 03/24/2018  . Bile leak 09/15/2016  . Sepsis ([facility] 08/23/2016  . Cholecystitis   . Stroke ([facility] 07/25/2015  . 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 Dizziness 08/09/2014  . TIA (transient ischemic attack) 05/15/2012  . Blurred vision 05/15/2012  . Ataxia 05/15/2012  . Diabetes mellitus type 2 with complications ([facility] 069/62/9528 . BPH (benign prostatic hyperplasia) 05/15/2012  . GERD (gastroesophageal reflux disease) 05/15/2012  . Obesity 05/15/2012  . Bipolar 1 disorder ([facility] 05/15/2012  . Essential hypertension 10/30/2010  . Cardiovascular risk factor 10/30/2010  . Pure hypercholesterolemia 12/05/2008    Past Surgical History: Past Surgical History:  Procedure Laterality Date  . BILIARY STENT PLACEMENT N/A 09/17/2016   Procedure: BILIARY STENT PLACEMENT;  Surgeon: RRogene Houston MD;  [facility];  Service: Endoscopy;  Laterality:  N/A;  . CARDIOVASCULAR STRESS TEST  2009   EF 63%  . CHOLECYSTECTOMY N/A 08/25/2016   Procedure: LAPAROSCOPIC CHOLECYSTECTOMY;  Surgeon: [name], MD;  Location: [facility] ORS;  Service: General;  Laterality: N/A;  . COLONOSCOPY    . ERCP N/A 09/17/2016   Procedure: ENDOSCOPIC RETROGRADE CHOLANGIOPANCREATOGRAPHY (ERCP);  Surgeon: [name], MD;  Location: [facility];  Service: Endoscopy;  Laterality: N/A;  730  . ERCP  N/A 12/03/2016   Procedure: ENDOSCOPIC RETROGRADE CHOLANGIOPANCREATOGRAPHY (ERCP);  Surgeon: [name], MD;  Location: [facility];  Service: Gastroenterology;  Laterality: N/A;  . ESOPHAGOGASTRODUODENOSCOPY    . GASTROINTESTINAL STENT REMOVAL N/A 12/03/2016   Procedure: BILIARY STENT REMOVAL;  Surgeon: [name], MD;  Location: [facility];  Service: Gastroenterology;  Laterality: N/A;  . NO PAST SURGERIES    . SPHINCTEROTOMY N/A 09/17/2016   Procedure: SPHINCTEROTOMY;  Surgeon: [name], MD;  Location: [facility];  Service: Endoscopy;  Laterality: N/A;    Allergies: No Known Allergies    Home Medications:  Current Outpatient Medications:  .  Ascorbic Acid (VITAMIN C) 1000 MG tablet, Take 1,000 mg by mouth daily., Disp: , Rfl:  .  aspirin EC 81 MG tablet, Take 1 tablet (81 mg total) by mouth daily., Disp: , Rfl:  .  atorvastatin (LIPITOR) 40 MG tablet, TAKE 1 TABLET BY MOUTH AT BEDTIME, Disp: 90 tablet, Rfl: 1 .  b complex vitamins tablet, Take 1 tablet by mouth daily., Disp: , Rfl:  .  Cholecalciferol (VITAMIN D) 2000 UNITS CAPS, Take 1 capsule by mouth 3 (three) times daily. , Disp: , Rfl:  .  Coenzyme Q10 (COQ10 PO), Take 1 tablet by mouth daily., Disp: , Rfl:  .  empagliflozin (JARDIANCE) 10 MG TABS tablet, Take 10 mg by mouth daily before breakfast., Disp: 90 tablet, Rfl: 1 .  FLUoxetine (PROZAC) 40 MG capsule, Take 1 capsule (40 mg total) by mouth every morning., Disp: 90 capsule, Rfl: 1 .  Garlic 193 MG TABS, Take by mouth., Disp: , Rfl:  .  glucose blood test strip, Use to check blood sugars daily E11.65, Disp: 270 each, Rfl: 3 .  lamoTRIgine (LAMICTAL) 150 MG tablet, Take 1 tablet (150 mg total) by mouth 2 (two) times daily., Disp: 180 tablet, Rfl: 1 .  LORazepam (ATIVAN) 1 MG tablet, TAKE 1 TABLET BY MOUTH THREE TIMES DAILY AS NEEDED FOR ANXIETY. GENERIC EQUIVALENT FOR ATIVAN., Disp: 270 tablet, Rfl: 1 .  losartan (COZAAR) 100 MG tablet, Take 1 tablet  (100 mg total) by mouth daily., Disp: 90 tablet, Rfl: 3 .  metFORMIN (GLUCOPHAGE) 1000 MG tablet, Take 1 tablet (1,000 mg total) by mouth daily with breakfast., Disp: 90 tablet, Rfl: 3 .  metoprolol tartrate (LOPRESSOR) 25 MG tablet, TAKE 1 TABLET BY MOUTH 2 TIMES DAILY. GENERIC EQUIVALENT FOR LOPRESSOR., Disp: 180 tablet, Rfl: 2 .  Multiple Vitamin (MULTIVITAMIN WITH MINERALS) TABS tablet, Take 1 tablet by mouth daily., Disp: , Rfl:  .  omeprazole (PRILOSEC) 40 MG capsule, TAKE 1 CAPSULE BY MOUTH 20 MINUTES BEFORE BREAKFAST., Disp: 90 capsule, Rfl: 0 .  Semaglutide (RYBELSUS) 7 MG TABS, Take 1 tablet by mouth daily., Disp: 30 tablet, Rfl: 2 .  alfuzosin (UROXATRAL) 10 MG 24 hr tablet, Take 1 tablet (10 mg total) by mouth daily., Disp: 30 tablet, Rfl: 2 .  CINNAMON PO, Take 1 tablet by mouth daily., Disp: , Rfl:  .  glipiZIDE (GLUCOTROL) 10 MG tablet, TAKE 1  TABLET BY MOUTH EVERY DAY (Patient not taking: Reported on 11/19/2019), Disp: 90 tablet, Rfl: 1   Family History: family history includes Colon cancer in his father; Hyperlipidemia in his father; Stroke (age of onset: 10) in his mother.  Colon cancer in father around age late 67s.   Social History:   reports that he has never smoked. He has never used smokeless tobacco. He reports that he does not drink alcohol and does not use drugs.   Review of Systems: Constitutional: Denies weight loss/weight gain  Eyes: No changes in vision. ENT: No oral lesions, sore throat.  GI: see HPI.  Heme/Lymph: No easy bruising.  CV: No chest pain.  GU: No hematuria.  Integumentary: No rashes.  Neuro: No headaches.  Psych: No depression/anxiety.  Endocrine: No heat/cold intolerance.  Allergic/Immunologic: No urticaria.  Resp: No cough, SOB.  Musculoskeletal: No joint swelling.    Physical Examination: BP (!) 146/63 (BP Location: Right Arm, Patient Position: Sitting)   Pulse 87   Temp 98.4 F (36.9 C) (Oral)   Ht '5\' 9"'  (1.753 m)   Wt 216 lb (98  kg)   BMI 31.90 kg/m  Gen: NAD, alert and oriented x 4 HEENT: PEERLA, EOMI, Neck: supple, no JVD Chest: CTA bilaterally, no wheezes, crackles, or other adventitious sounds CV: RRR, no m/g/c/r Abd: soft, NT, ND, +BS in all four quadrants; no HSM, guarding, ridigity, or rebound tenderness Ext: no edema, well perfused with 2+ pulses, Skin: no rash or lesions noted on observed skin Lymph: no noted LAD  Data Reviewed:  11/03/2019-cr 1.49, glucose 281, alk phos 167, alt 47, ast 36   07/04/19-Alk phos 140, AST 109, ALT 79, Cr 1.38   12/2018--AST 56, ALT 53, negative hepatitis B serologies, smooth muscle and mitochondrial antibody normal.  Iron panel normal.  IgG low at 530, ANA negative.  Alpha-1 antitrypsin normal.  July 2020-alk phos 123, AST 71, ALT 60.  Creatinine 1.46  IMPRESSION: [place] 12/2018 c 1. Fatty infiltration of the liver. 2. No acute findings. 3. No significant bile duct dilatation. 4. Bilateral renal cortex thinning suggesting some degree of chronic medical renal disease. 5. Status post cholecystectomy.   Assessment/Plan: Mr. Lanni is a 64 y.o. male seen for follow-up of abnormal LFTs.  transaminases were elevated in March 2021 but improved a few weeks ago on pcp labs, now alk phos 167. Serologic work-up last year was unremarkable.  Ultrasound 12/2018 as above.  We will discuss labs with Dr. Laural Golden for further recommendations.  2. Dysphagia-he reports intermittent dysphagia to hamburger.  Recommended an upper endoscopy for evaluation.  He feels currently symptoms are intermittent and prefers to wait to schedule at the time of his colonoscopy next year.  Reviewed importance of contacting me if symptoms become more frequent or loses any weight unintentionally.  It is difficult to discern whether his weight loss is intentional to better control diabetes or unintentional.   3.  GERD-continue current dose of PPI.  4.  History of colon polyps-due for repeat next year.   Kamsiyochukwu was  seen today for follow-up.  Diagnoses and all orders for this visit:  Esophageal dysphagia  Chronic GERD  Elevated LFTs   Will discuss care w/ Dr Laural Golden.   I personally performed the service, non-incident to. (WP)  Laurine Blazer, [facility]

## 2019-11-19 NOTE — Patient Instructions (Signed)
Please contact us with swallowing issues worsens - I recommend endoscopy for evaluation.  Monitor weight and notify us if continues to loose weight

## 2019-11-27 ENCOUNTER — Other Ambulatory Visit: Payer: Self-pay | Admitting: Nurse Practitioner

## 2019-11-27 DIAGNOSIS — E119 Type 2 diabetes mellitus without complications: Secondary | ICD-10-CM

## 2019-11-27 DIAGNOSIS — N183 Chronic kidney disease, stage 3 unspecified: Secondary | ICD-10-CM

## 2019-11-27 MED ORDER — METFORMIN HCL 1000 MG PO TABS: 1000.0000 mg | ORAL_TABLET | Freq: Two times a day (BID) | ORAL | 1 refills | Status: DC

## 2019-11-29 ENCOUNTER — Encounter: Payer: Self-pay | Admitting: Nurse Practitioner

## 2019-12-07 ENCOUNTER — Telehealth (INDEPENDENT_AMBULATORY_CARE_PROVIDER_SITE_OTHER): Payer: Medicare Other | Admitting: Physician Assistant

## 2019-12-07 ENCOUNTER — Encounter: Payer: Self-pay | Admitting: Physician Assistant

## 2019-12-07 ENCOUNTER — Telehealth: Payer: Medicare Other | Admitting: Physician Assistant

## 2019-12-07 DIAGNOSIS — F411 Generalized anxiety disorder: Secondary | ICD-10-CM

## 2019-12-07 DIAGNOSIS — F3341 Major depressive disorder, recurrent, in partial remission: Secondary | ICD-10-CM | POA: Diagnosis not present

## 2019-12-07 MED ORDER — LORAZEPAM 1 MG PO TABS: ORAL_TABLET | ORAL | 1 refills | Status: DC

## 2019-12-07 MED ORDER — FLUOXETINE HCL 40 MG PO CAPS: 40.0000 mg | ORAL_CAPSULE | Freq: Every morning | ORAL | 1 refills | Status: DC

## 2019-12-07 MED ORDER — LAMOTRIGINE 150 MG PO TABS: 150.0000 mg | ORAL_TABLET | Freq: Two times a day (BID) | ORAL | 1 refills | Status: DC

## 2019-12-07 NOTE — Progress Notes (Signed)
[facility] Med Check  Patient ID: SHIRLEY BOLLE,  MRN: [id number]  PCP: [name], [place]  Date of Evaluation: 12/07/2019 Time spent:20 minutes  Chief Complaint:  Chief Complaint    Follow-up     Virtual Visit via Telehealth  I connected with patient by telephone, with their informed consent, and verified patient privacy and that I am speaking with the correct person using two identifiers.  I am private, in my office and the patient is at home.  I discussed the limitations, risks, security and privacy concerns of performing an evaluation and management service by telephone and the availability of in person appointments. I also discussed with the patient that there may be a patient responsible charge related to this service. The patient expressed understanding and agreed to proceed.   I discussed the assessment and treatment plan with the patient. The patient was provided an opportunity to ask questions and all were answered. The patient agreed with the plan and demonstrated an understanding of the instructions.   The patient was advised to call back or seek an in-person evaluation if the symptoms worsen or if the condition fails to improve as anticipated.  I provided 20 minutes of non-face-to-face time during this encounter.  HISTORY/CURRENT STATUS: HPI For routine med check.  Thinks his meds are working well.  Still grieving from his dad's death last 13-Jan-2023.  On his birthday. But overall, he feels like the Lamictal, Prozac, and Ativan are working fine. He's able to enjoy things, "when I get a chance, but I can't do much b/c of covid." He doesn't go anywhere except to the dollar store or drive thru to get food. Energy and motivation are good.  Sleeps good but not always the whole night.  No SI/HI.  Anxiety is controlled. The Ativan helps a lot. Most of the time, he needs everyday but usually just twice a day now.  Patient denies increased energy with decreased need for sleep, no  increased talkativeness, no racing thoughts, no impulsivity or risky behaviors, no increased spending, no increased libido, no grandiosity.  Denies dizziness, syncope, seizures, numbness, tingling, tremor, tics, unsteady gait, slurred speech, confusion. Denies muscle or joint pain, stiffness, or dystonia.  Individual Medical History/ Review of Systems: Changes? :No    Past medications for mental health diagnoses include: Ambien, Ativan, Epitol, Lamictal, Prozac, Xanax, Wellbutrin  Allergies: Patient has no known allergies.  Current Medications:  Current Outpatient Medications:    alfuzosin (UROXATRAL) 10 MG 24 hr tablet, Take 1 tablet (10 mg total) by mouth daily., Disp: 30 tablet, Rfl: 2   Ascorbic Acid (VITAMIN C) 1000 MG tablet, Take 1,000 mg by mouth daily., Disp: , Rfl:    aspirin EC 81 MG tablet, Take 1 tablet (81 mg total) by mouth daily., Disp: , Rfl:    atorvastatin (LIPITOR) 40 MG tablet, TAKE 1 TABLET BY MOUTH AT BEDTIME, Disp: 90 tablet, Rfl: 1   b complex vitamins tablet, Take 1 tablet by mouth daily., Disp: , Rfl:    Cholecalciferol (VITAMIN D) 2000 UNITS CAPS, Take 1 capsule by mouth 3 (three) times daily. , Disp: , Rfl:    CINNAMON PO, Take 1 tablet by mouth daily., Disp: , Rfl:    Coenzyme Q10 (COQ10 PO), Take 1 tablet by mouth daily., Disp: , Rfl:    empagliflozin (JARDIANCE) 10 MG TABS tablet, Take 10 mg by mouth daily before breakfast., Disp: 90 tablet, Rfl: 1   FLUoxetine (PROZAC) 40 MG capsule, Take 1 capsule (40 mg  total) by mouth every morning., Disp: 90 capsule, Rfl: 1   Garlic 641 MG TABS, Take by mouth., Disp: , Rfl:    glucose blood test strip, Use to check blood sugars daily E11.65, Disp: 270 each, Rfl: 3   lamoTRIgine (LAMICTAL) 150 MG tablet, Take 1 tablet (150 mg total) by mouth 2 (two) times daily., Disp: 180 tablet, Rfl: 1   LORazepam (ATIVAN) 1 MG tablet, TAKE 1 TABLET BY MOUTH THREE TIMES DAILY AS NEEDED FOR ANXIETY. GENERIC EQUIVALENT FOR  ATIVAN., Disp: 270 tablet, Rfl: 1   losartan (COZAAR) 100 MG tablet, Take 1 tablet (100 mg total) by mouth daily., Disp: 90 tablet, Rfl: 3   metFORMIN (GLUCOPHAGE) 1000 MG tablet, Take 1 tablet (1,000 mg total) by mouth 2 (two) times daily with a meal., Disp: 180 tablet, Rfl: 1   metoprolol tartrate (LOPRESSOR) 25 MG tablet, TAKE 1 TABLET BY MOUTH 2 TIMES DAILY. GENERIC EQUIVALENT FOR LOPRESSOR., Disp: 180 tablet, Rfl: 2   Multiple Vitamin (MULTIVITAMIN WITH MINERALS) TABS tablet, Take 1 tablet by mouth daily., Disp: , Rfl:    omeprazole (PRILOSEC) 40 MG capsule, TAKE 1 CAPSULE BY MOUTH 20 MINUTES BEFORE BREAKFAST., Disp: 90 capsule, Rfl: 0   glipiZIDE (GLUCOTROL) 10 MG tablet, TAKE 1 TABLET BY MOUTH EVERY DAY (Patient not taking: Reported on 11/19/2019), Disp: 90 tablet, Rfl: 1   Semaglutide (RYBELSUS) 7 MG TABS, Take 1 tablet by mouth daily., Disp: 30 tablet, Rfl: 2 Medication Side Effects: none  Family Medical/ Social History: Changes?  No MENTAL HEALTH EXAM:  There were no vitals taken for this visit.There is no height or weight on file to calculate BMI.  General Appearance: unable to assess  Eye Contact:  unable to assess  Speech:  Clear and Coherent and Normal Rate  Volume:  Normal  Mood:  Euthymic  Affect:  unable to assess  Thought Process:  Goal Directed and Descriptions of Associations: Intact  Orientation:  Full (Time, Place, and Person)  Thought Content: Logical   Suicidal Thoughts:  No  Homicidal Thoughts:  No  Memory:  WNL  Judgement:  Good  Insight:  Good  Psychomotor Activity:  unable to assess  Concentration:  Concentration: Good and Attention Span: Good  Recall:  Good  Fund of Knowledge: Good  Language: Good  Assets:  Desire for Improvement  ADL's:  Intact  Cognition: WNL  Prognosis:  Good    DIAGNOSES:    ICD-10-CM   1. Recurrent major depressive disorder, in partial remission ([place])  F33.41   2. Generalized anxiety disorder  F41.1     Receiving  Psychotherapy: No    RECOMMENDATIONS:  PDMP was reviewed. I provided 20 minutes of nonface-to-face time during this encounter. Since his medications are effective, I am not making any changes.  The Lamictal is being used off label for major depression, he understands and accepts any risk associated with it.  He has had no side effects whatsoever. Continue Prozac 40 mg every morning. Continue Lamictal 150 mg 1 p.o. twice daily. Continue Ativan 1 mg, 1 p.o. 3 times daily as needed. Return in 6 months.  Donnal Moat, PA-C

## 2019-12-11 ENCOUNTER — Other Ambulatory Visit: Payer: Self-pay

## 2019-12-11 MED ORDER — EMPAGLIFLOZIN 25 MG PO TABS
25.0000 mg | ORAL_TABLET | Freq: Every day | ORAL | 2 refills | Status: DC
Start: 2019-12-11 — End: 2020-02-19

## 2019-12-14 ENCOUNTER — Other Ambulatory Visit (INDEPENDENT_AMBULATORY_CARE_PROVIDER_SITE_OTHER): Payer: Self-pay | Admitting: Gastroenterology

## 2019-12-18 ENCOUNTER — Telehealth: Payer: Self-pay

## 2019-12-18 ENCOUNTER — Other Ambulatory Visit: Payer: Self-pay | Admitting: Nurse Practitioner

## 2019-12-18 DIAGNOSIS — E119 Type 2 diabetes mellitus without complications: Secondary | ICD-10-CM

## 2019-12-18 MED ORDER — TRESIBA FLEXTOUCH 100 UNIT/ML ~~LOC~~ SOPN: 20.0000 [IU] | PEN_INJECTOR | Freq: Every day | SUBCUTANEOUS | 2 refills | Status: DC

## 2019-12-18 NOTE — Chronic Care Management (AMB) (Signed)
Chronic Care Management Pharmacy Assistant   Name: Robert Walters  MRN: [id number] DOB: January 31, 1956  Reason for Encounter: Medication Review/ Patient Assistance Coordination  PCP : [name], FNP  Allergies:  No Known Allergies  Medications: Outpatient Encounter Medications as of 12/18/2019  Medication Sig  . alfuzosin (UROXATRAL) 10 MG 24 hr tablet Take 1 tablet (10 mg total) by mouth daily.  . Ascorbic Acid (VITAMIN C) 1000 MG tablet Take 1,000 mg by mouth daily.  [name] aspirin EC 81 MG tablet Take 1 tablet (81 mg total) by mouth daily.  [name] atorvastatin (LIPITOR) 40 MG tablet TAKE 1 TABLET BY MOUTH AT BEDTIME  . b complex vitamins tablet Take 1 tablet by mouth daily.  . Cholecalciferol (VITAMIN D) 2000 UNITS CAPS Take 1 capsule by mouth 3 (three) times daily.   [name] CINNAMON PO Take 1 tablet by mouth daily.  . Coenzyme Q10 (COQ10 PO) Take 1 tablet by mouth daily.  . empagliflozin (JARDIANCE) 25 MG TABS tablet Take 1 tablet (25 mg total) by mouth daily before breakfast.  . FLUoxetine (PROZAC) 40 MG capsule Take 1 capsule (40 mg total) by mouth every morning.  . Garlic 371 MG TABS Take by mouth.  [name] glipiZIDE (GLUCOTROL) 10 MG tablet TAKE 1 TABLET BY MOUTH EVERY DAY (Patient not taking: Reported on 11/19/2019)  . glucose blood test strip Use to check blood sugars daily E11.65  . lamoTRIgine (LAMICTAL) 150 MG tablet Take 1 tablet (150 mg total) by mouth 2 (two) times daily.  [name] LORazepam (ATIVAN) 1 MG tablet TAKE 1 TABLET BY MOUTH THREE TIMES DAILY AS NEEDED FOR ANXIETY. GENERIC EQUIVALENT FOR ATIVAN.  [name] losartan (COZAAR) 100 MG tablet Take 1 tablet (100 mg total) by mouth daily.  . metFORMIN (GLUCOPHAGE) 1000 MG tablet Take 1 tablet (1,000 mg total) by mouth 2 (two) times daily with a meal.  . metoprolol tartrate (LOPRESSOR) 25 MG tablet TAKE 1 TABLET BY MOUTH 2 TIMES DAILY. GENERIC EQUIVALENT FOR LOPRESSOR.  . Multiple Vitamin (MULTIVITAMIN WITH MINERALS) TABS tablet Take 1 tablet by  mouth daily.  [name] omeprazole (PRILOSEC) 40 MG capsule TAKE 1 CAPSULE BY MOUTH 20 MINUTES BEFORE BREAKFAST.  [name] Semaglutide (RYBELSUS) 7 MG TABS Take 1 tablet by mouth daily.   No facility-administered encounter medications on file as of 12/18/2019.    Current Diagnosis: Patient Active Problem List   Diagnosis Date Noted  . Chronic diastolic heart failure ([facility]) 03/27/2019  . Fatty liver 12/26/2018  . Elevated LFTs 12/26/2018  . History of colonic polyps 12/26/2018  . Fall 08/28/2018  . Rib pain on left side 08/28/2018  . Anxiety 03/31/2018  . Bipolar II disorder ([place]) 03/31/2018  . Insomnia 03/31/2018  . Type 2 diabetes mellitus without complication, without long-term current use of insulin ([place]) 03/24/2018  . Bile leak 09/15/2016  . Sepsis ([place]) 08/23/2016  . Cholecystitis   . Stroke ([place]) 07/25/2015  . 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 Dizziness 08/09/2014  . TIA (transient ischemic attack) 05/15/2012  . Blurred vision 05/15/2012  . Ataxia 05/15/2012  . Diabetes mellitus type 2 with complications ([place]) 10/27/9483  . BPH (benign prostatic hyperplasia) 05/15/2012  . GERD (gastroesophageal reflux disease) 05/15/2012  . Obesity 05/15/2012  . Bipolar 1 disorder ([place]) 05/15/2012  . Essential hypertension 10/30/2010  . Cardiovascular risk factor 10/30/2010  . Pure hypercholesterolemia 12/05/2008     Follow-Up:  Patient Assistance Coordination- Patient will be new with CCM, waiting on referral from PCP, patient  assistance for Rybelsus started, awaiting patient signature, documentation, dr signature and prescription.   01/01/20- Awaiting referral for CCM, will notify Pharmacist for follow up with PCP, will work on additional patient assistance for [place] per [employer].   Pattricia Boss, [place] Pharmacist Assistant [phone]

## 2019-12-19 ENCOUNTER — Encounter: Payer: Self-pay | Admitting: Nurse Practitioner

## 2019-12-26 ENCOUNTER — Telehealth: Payer: Self-pay | Admitting: Urology

## 2019-12-26 NOTE — Telephone Encounter (Signed)
Pharmacy left vm regarding questions about insurance denying efills on pt meds. Would like a call back at [phone]

## 2019-12-26 NOTE — Telephone Encounter (Signed)
Spoke with [facility] mail service. Will fax over paperwork to initiate PA

## 2019-12-27 ENCOUNTER — Telehealth: Payer: Self-pay

## 2020-01-02 ENCOUNTER — Encounter: Payer: Self-pay | Admitting: Nurse Practitioner

## 2020-01-02 ENCOUNTER — Other Ambulatory Visit: Payer: Self-pay | Admitting: Nurse Practitioner

## 2020-01-02 DIAGNOSIS — H524 Presbyopia: Secondary | ICD-10-CM | POA: Diagnosis not present

## 2020-01-02 DIAGNOSIS — E1165 Type 2 diabetes mellitus with hyperglycemia: Secondary | ICD-10-CM

## 2020-01-02 DIAGNOSIS — Z7984 Long term (current) use of oral hypoglycemic drugs: Secondary | ICD-10-CM | POA: Diagnosis not present

## 2020-01-02 DIAGNOSIS — Z794 Long term (current) use of insulin: Secondary | ICD-10-CM | POA: Diagnosis not present

## 2020-01-02 DIAGNOSIS — E119 Type 2 diabetes mellitus without complications: Secondary | ICD-10-CM | POA: Diagnosis not present

## 2020-01-02 DIAGNOSIS — H2513 Age-related nuclear cataract, bilateral: Secondary | ICD-10-CM | POA: Diagnosis not present

## 2020-01-02 LAB — HM DIABETES EYE EXAM

## 2020-01-03 ENCOUNTER — Telehealth: Payer: Self-pay | Admitting: *Deleted

## 2020-01-03 ENCOUNTER — Telehealth: Payer: Self-pay

## 2020-01-03 NOTE — Chronic Care Management (AMB) (Signed)
°  Chronic Care Management   Note  01/03/2020 Name: Robert Walters MRN: [id number] DOB: Aug 20, 1955  Robert Walters is a 64 y.o. year old male who is a primary care patient of [name], [place] and is actively engaged with the care management team. I reached out to Kathyrn Lass by phone today to assist with scheduling an initial visit with the Pharmacist.  Follow up plan: Unsuccessful telephone outreach attempt made. The care management team will reach out to the patient again over the next 7 days. If patient returns call to provider office, please advise to call [place] at [phone].  [place] [facility]

## 2020-01-03 NOTE — Chronic Care Management (AMB) (Signed)
Chronic Care Management Pharmacy Assistant   Name: Robert Walters  MRN: [id number] DOB: Apr 11, 1956  Reason for Encounter: Patient Assistance Coordination/ Initial Questions for Initial Pharmacist Visit.  PCP : [name], FNP  Allergies:  No Known Allergies  Medications: Outpatient Encounter Medications as of 01/03/2020  Medication Sig   alfuzosin (UROXATRAL) 10 MG 24 hr tablet Take 1 tablet (10 mg total) by mouth daily.   Ascorbic Acid (VITAMIN C) 1000 MG tablet Take 1,000 mg by mouth daily.   aspirin EC 81 MG tablet Take 1 tablet (81 mg total) by mouth daily.   atorvastatin (LIPITOR) 40 MG tablet TAKE 1 TABLET BY MOUTH AT BEDTIME   b complex vitamins tablet Take 1 tablet by mouth daily.   Cholecalciferol (VITAMIN D) 2000 UNITS CAPS Take 1 capsule by mouth 3 (three) times daily.    CINNAMON PO Take 1 tablet by mouth daily.   Coenzyme Q10 (COQ10 PO) Take 1 tablet by mouth daily.   empagliflozin (JARDIANCE) 25 MG TABS tablet Take 1 tablet (25 mg total) by mouth daily before breakfast.   FLUoxetine (PROZAC) 40 MG capsule Take 1 capsule (40 mg total) by mouth every morning.   Garlic 379 MG TABS Take by mouth.   glipiZIDE (GLUCOTROL) 10 MG tablet TAKE 1 TABLET BY MOUTH EVERY DAY (Patient not taking: Reported on 11/19/2019)   glucose blood test strip Use to check blood sugars daily E11.65   insulin degludec (TRESIBA FLEXTOUCH) 100 UNIT/ML FlexTouch Pen Inject 0.2 mLs (20 Units total) into the skin daily.   lamoTRIgine (LAMICTAL) 150 MG tablet Take 1 tablet (150 mg total) by mouth 2 (two) times daily.   LORazepam (ATIVAN) 1 MG tablet TAKE 1 TABLET BY MOUTH THREE TIMES DAILY AS NEEDED FOR ANXIETY. GENERIC EQUIVALENT FOR ATIVAN.   losartan (COZAAR) 100 MG tablet Take 1 tablet (100 mg total) by mouth daily.   metFORMIN (GLUCOPHAGE) 1000 MG tablet Take 1 tablet (1,000 mg total) by mouth 2 (two) times daily with a meal.   metoprolol tartrate (LOPRESSOR) 25 MG tablet TAKE 1  TABLET BY MOUTH 2 TIMES DAILY. GENERIC EQUIVALENT FOR LOPRESSOR.   Multiple Vitamin (MULTIVITAMIN WITH MINERALS) TABS tablet Take 1 tablet by mouth daily.   omeprazole (PRILOSEC) 40 MG capsule TAKE 1 CAPSULE BY MOUTH 20 MINUTES BEFORE BREAKFAST.   Semaglutide (RYBELSUS) 7 MG TABS Take 1 tablet by mouth daily.   No facility-administered encounter medications on file as of 01/03/2020.    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 Awaiting patient signature, income verification, and provider signature. Patient has an appointment on 01/14/2020,PAP forms for Tresiba and Rybelsus awaiting signature also. Forms will be faxed at office visit.   01/10/20- Spoke with patient, requested to change telephone visit to in person visit for  patient assistance paperwork. Patient agreed, patient will also bring in income verification and any out of pocket expenses he has for his prescriptions. Initial Questions also asked during this encounter. Robert Walters, CPP notified.  IQ Patient Questions: Have you seen any other providers since your last visit? no Any changes in your medications or health? no Any side effects from any medications? no Do you have an symptoms or problems not managed by your medications? no Any concerns about your health right now? no Has your provider asked that you check blood pressure, blood sugar, or follow special diet at home? Yes- Blood sugars are getting better since he started [place] , ranges are around 245 to 190, used to be the 400's.   Do you get any type of exercise on a regular basis? no Can you think of a goal you would like to reach for your health? Patient would like to get off some medications in the future for blood sugars.  Do you have any problems getting your medications? No problems getting medications from pharmacy but is having problems with insurance paying for his medications. Patient aware we are working on some Patient Assistance for his medications [name], [name] and Rybelsus. Is there anything that you would like to discuss during the appointment? Diabetes medications, insulin, was told that he would be on for about 2 months. He had a question about Dietician appointment.  Patient aware to please bring medications, supplements, blood pressure and blood sugar logs  to appointment with Robert Walters, CPP on 01/14/2020 at [place], [place] Pharmacist Assistant [phone]

## 2020-01-08 NOTE — Chronic Care Management (AMB) (Signed)
  Chronic Care Management   Note  01/08/2020 Name: Robert Walters MRN: [id number] DOB: 01-16-1956  [name] is a 64 y.o. year old male who is a primary care patient of [name], [place]. I reached out to Kathyrn Lass by phone today in response to a referral sent by Robert Walters's PCP, [name], [place].     Robert Walters was given information about Chronic Care Management services today including:  1. CCM service includes personalized support from designated clinical staff supervised by his physician, including individualized plan of care and coordination with other care providers 2. 24/7 contact phone numbers for assistance for urgent and routine care needs. 3. Service will only be billed when office clinical staff spend 20 minutes or more in a month to coordinate care. 4. Only one practitioner may furnish and bill the service in a calendar month. 5. The patient may stop CCM services at any time (effective at the end of the month) by phone call to the office staff. 6. The patient will be responsible for cost sharing (co-pay) of up to 20% of the service fee (after annual deductible is met).  Patient agreed to services and verbal consent obtained.   Follow up plan: Telephone appointment with care management team member scheduled for:01/14/2020  [place] [facility]

## 2020-01-14 ENCOUNTER — Ambulatory Visit: Payer: Medicare Other

## 2020-01-14 ENCOUNTER — Other Ambulatory Visit: Payer: Self-pay

## 2020-01-14 NOTE — Chronic Care Management (AMB) (Signed)
Chronic Care Management Pharmacy  Name: Robert Walters  MRN: [id number] DOB: 05/12/1955  Chief Complaint/ HPI  Robert Walters,  64 y.o. , male presents for their Initial CCM visit with the clinical pharmacist In office.  PCP : [name], FNP  Their chronic conditions include: Hypertension, Hyperlipidemia, Diabetes, Heart Failure, GERD and Anxiety  Office Visits: 11/27/19 Labwork review: HgbA1c up to 10.7%. Will try Farxiga daily instead of Rybelsus, but finish Rybelsus sample.   11/06/19 OV: Total cholesterol up to 237. Triglycerides elevated to 511 from 476. Reduce sweets and breads and take OTC fish oil 1091m daily. HDL has decreased, increase fish and nut intake. Kidney function stable. Stay well hydrated. Liver enzymes slightly up, avid processed foods. Pt advised to hold glipizide. Start on Rybelsus daily pending insurance approval. Provided dietary recommendations and advised to check blood sugar daily. BP fairly controlled. Tdap prescription sent to pharmacy. Follow up with urology for urge incontinence.   Consult Visits: 12/07/19 Behavioral Health Televisit w/ T. Hurst  11/19/19 Gastroenterology OV w/ J. Woodard: Follow up for history of colon polyps, fatty liver. Denies nausea and vomiting today. On omeprazole daily to control GERD. Recommend upper endoscopy for dysphagia. Pt states symptoms are intermittent and would prefer to wait until colonoscopy. Continue PPI.   10/22/19 Cardiology OV w/ Dr. NAcie Fredrickson Presents for follow up. Pt has cut back on salt intake. No chest pain or dyspnea reported. BP on the low side today. Pt states BP is usually 130-140. Continue current medications. May need to cut some medications if BP continues to be low. Follow up in 1 year.   CCM Encounters: 08/20/19 RN: Evaluation of current treatment plans. Provided pt education.  07/16/19 RN: Established care plan  07/16/19 SW: CCM consent  Medications: Outpatient Encounter Medications as of 01/14/2020    Medication Sig  . alfuzosin (UROXATRAL) 10 MG 24 hr tablet Take 1 tablet (10 mg total) by mouth daily.  . Ascorbic Acid (VITAMIN C) 500 MG CAPS Take 500 mg by mouth daily.   .Marland Kitchenaspirin EC 81 MG tablet Take 1 tablet (81 mg total) by mouth daily.  .Marland Kitchenatorvastatin (LIPITOR) 40 MG tablet TAKE 1 TABLET BY MOUTH AT BEDTIME  . cholecalciferol (VITAMIN D3) 25 MCG (1000 UNIT) tablet Take 2 each by mouth daily.   .Marland KitchenCINNAMON PO Take 1 tablet by mouth daily. 10046mdaily  . Coenzyme Q10 (COQ10) 200 MG CAPS Take 1 tablet by mouth daily.   . empagliflozin (JARDIANCE) 25 MG TABS tablet Take 1 tablet (25 mg total) by mouth daily before breakfast.  . FLUoxetine (PROZAC) 40 MG capsule Take 1 capsule (40 mg total) by mouth every morning.  . Garlic 101157G CAPS Take 1 tablet by mouth daily.   . Marland Kitchenlucose blood test strip Use to check blood sugars daily E11.65  . insulin degludec (TRESIBA FLEXTOUCH) 100 UNIT/ML FlexTouch Pen Inject 60 Units into the skin daily.  . Marland KitchenamoTRIgine (LAMICTAL) 150 MG tablet Take 1 tablet (150 mg total) by mouth 2 (two) times daily.  . Marland KitchenORazepam (ATIVAN) 1 MG tablet TAKE 1 TABLET BY MOUTH THREE TIMES DAILY AS NEEDED FOR ANXIETY. GENERIC EQUIVALENT FOR ATIVAN.  . Marland Kitchenosartan (COZAAR) 100 MG tablet Take 1 tablet (100 mg total) by mouth daily.  . metFORMIN (GLUCOPHAGE) 1000 MG tablet Take 1 tablet (1,000 mg total) by mouth 2 (two) times daily with a meal.  . metoprolol tartrate (LOPRESSOR) 25 MG tablet TAKE 1 TABLET BY MOUTH 2 TIMES DAILY. GENERIC  EQUIVALENT FOR LOPRESSOR.  . Multiple Vitamin (MULTIVITAMIN WITH MINERALS) TABS tablet Take 1 tablet by mouth daily.  . Omega-3 Fatty Acids (FISH OIL) 1000 MG CAPS Take 1 capsule by mouth daily.  [name] omeprazole (PRILOSEC) 40 MG capsule TAKE 1 CAPSULE BY MOUTH 20 MINUTES BEFORE BREAKFAST.  [name] Semaglutide (RYBELSUS) 7 MG TABS Take 1 tablet by mouth daily.  . vitamin B-12 (CYANOCOBALAMIN) 500 MCG tablet Take 500 mcg by mouth daily.  [name] b complex vitamins  tablet Take 1 tablet by mouth daily. (Patient not taking: Reported on 01/31/2020)  . glipiZIDE (GLUCOTROL) 10 MG tablet TAKE 1 TABLET BY MOUTH EVERY DAY (Patient not taking: Reported on 11/19/2019)  . [DISCONTINUED] insulin degludec (TRESIBA FLEXTOUCH) 100 UNIT/ML FlexTouch Pen Inject 0.2 mLs (20 Units total) into the skin daily.   No facility-administered encounter medications on file as of 01/14/2020.    Current Diagnosis/Assessment:  SDOH Interventions     Most Recent Value  SDOH Interventions  Financial Strain Interventions Other (Comment)  [Assisted with patient assistance applications for Tresiba, Jardiance, and Rybelsus]      Goals Addressed            This Visit's Progress   . Pharmacy Care Plan       CARE PLAN ENTRY (see longitudinal plan of care for additional care plan information)  Current Barriers:  . Chronic Disease Management support, education, and care coordination needs related to Hypertension, Hyperlipidemia, and Diabetes   Hypertension BP Readings from Last 3 Encounters:  11/19/19 (!) 146/63  11/06/19 136/84  10/22/19 (!) 90/58   . Pharmacist Clinical Goal(s): o Over the next 90 days, patient will work with PharmD and providers to achieve BP goal <130/80 . Current regimen:  o Losartan 121m daily o Metoprolol tartrate 240mtwice daily . Interventions: . Provided dietary and exercise recommendations . Patient was only taking metoprolol once daily. Advised patient to increase to 1 tablet twice daily as directed . Discussed why patient is taking losartan . Patient self care activities - Over the next 90 days, patient will: o Check BP 2-3 times weekly, document, and provide at future appointments o Ensure daily salt intake < 2300 mg/day o Start taking metoprolol tartrate 1 tablet TWICE daily as directed o Work on exercising more with a goal of 30 minutes daily 5 times per week (150 minutes total)  Hyperlipidemia Lab Results  Component Value Date/Time    LDLCALC 110 (H) 11/06/2019 10:53 AM   LDLDIRECT 124.3 11/10/2010 09:53 AM   . Pharmacist Clinical Goal(s): o Over the next 90 days, patient will work with PharmD and providers to achieve LDL goal < 70 . Current regimen:  o Atorvastatin 4078maily . Interventions: o Provided dietary and exercise recommendations . Patient self care activities - Over the next 90 days, patient will: o Work on exercising more with a goal of 30 minutes daily 5 times per week (150 minutes total)  Diabetes Lab Results  Component Value Date/Time   HGBA1C 10.7 (H) 11/06/2019 10:53 AM   HGBA1C 9.9 (H) 07/04/2019 12:33 PM   . Pharmacist Clinical Goal(s): o Over the next 90 days, patient will work with PharmD and providers to achieve A1c goal <7% . Current regimen:  o Jardiance 5m63mily before breakfast o Tresiba 45 units daily o Metformin 1000mg16mce daily o Rybelsus 3mg d16my . Interventions: o Provided dietary and exercise recommendations - Recommend patient focus on eating healthier, well-balanced meals o Determined patient is speaking with a diabetic educator weekly  to adjust insulin and discuss diet o Patient assistance programs [name] and Rybelsus 46m through [employer] Jardiance 258mthrough [employer]en[employer]in- Patient provided proof of income and signed patient portion of applications - Applications provided to PCP to sign - Completed applications faxed to respective patient assistance programs o Discussed administration site for Tr[place] and [place]bud[place] duration  o Discussed timing of Rybelsus administration . Patient self care activities - Over the next 90 days, patient will: o Check blood sugar twice daily, document, and provide at future appointments o Contact provider with any episodes of hypoglycemia o Work on exercising more with a goal of 30 minutes daily 5 times per week (150 minutes total) o Decrease carbohydrate consumption with each meal o Reduce intake of sweet tea  Medication  management . Pharmacist Clinical Goal(s): o Over the next 90 days, patient will work with PharmD and providers to achieve optimal medication adherence . [facility] Interventions o Comprehensive medication review performed. o Continue current medication management strategy . Patient self care activities - Over the next 90 days, patient will: o Focus on medication adherence by utilizing a pill box o Take medications as prescribed o Report any questions or concerns to PharmD and/or provider(s)  Initial goal documentation        Diabetes   A1c goal <7%  Recent Relevant Labs: Lab Results  Component Value Date/Time   HGBA1C 10.7 (H) 11/06/2019 10:53 AM   HGBA1C 9.9 (H) 07/04/2019 12:33 PM   MICROALBUR 30 11/06/2019 04:23 PM   MICROALBUR 80 03/21/2019 12:42 PM    Kidney Function Lab Results  Component Value Date/Time   CREATININE 1.49 (H) 11/06/2019 10:53 AM   CREATININE 1.38 (H) 07/04/2019 12:33 PM   CREATININE 1.33 (H) 10/19/2016 02:51 PM   GFRNONAA 49 (L) 11/06/2019 10:53 AM   GFRAA 56 (L) 11/06/2019 10:53 AM   K 5.2 11/06/2019 10:53 AM   K 5.1 07/04/2019 12:33 PM    Last diabetic Eye exam:  Lab Results  Component Value Date/Time   HMDIABEYEEXA No Retinopathy 01/02/2020 12:00 AM    Last diabetic Foot exam: No results found for: HMDIABFOOTEX   Checking BG: 2x per Day  Recent FBG Readings: 230-240 Recent pre-meal BG readings:  Recent 2hr PP BG readings: 370 (up to 400) Recent HS BG readings:   Patient has failed these meds in past: N/A Patient is currently uncontrolled on the following medications: . Jardiance 1074maily before breakfast . Glipizide 39m73mily (Pt holding) . Tresiba 45 units daily . Metformin 1000mg63mce daily . Rybelsus 3mg d54my  We discussed:  . Spoke with PCP prior to office visit today. She would prefer pt to be on Tresiba and Rybelsus if Rybelsus can be  approved for pt assistance. . Patient assistance program for TresibAntig[place] and Barbudaybelsus through [employer] [place] program and application process o Pt provided proof of income documents and signed pt portion of applications o Applied for Rybelsus 7mg/1452mand Jardiance 25mg . 70m extensively o Pt does not endorse a healthy diet o He mentions eating sausage biscuits, hash browns, bacon, sausage, pork chops, and salad o He stats he does not like chicken or fish o Pt endorses drinking sweet tea . Exercise extensively o Pt has not been exercising since his mother died, he has been feeling depressed o Recommend pt get 30 minutes of moderate intensity exercise daily 5 times a week (150 minutes total  per week) . Has been speaking with diabetic educator Colvin Caroli) for diet education and adjusting insulin . Discussed administration site of Tresiba and duration of medication . Pt is not taking glipizide as directed . Discussed timing of administration of Rybelsus o Pt takes at least 30 minutes before omeprazole  Plan Continue current medications   Hyperlipidemia   LDL goal < 70  Lipid Panel     Component Value Date/Time   CHOL 237 (H) 11/06/2019 1053   TRIG 511 (H) 11/06/2019 1053   HDL 39 (L) 11/06/2019 1053   LDLCALC 110 (H) 11/06/2019 1053   LDLDIRECT 124.3 11/10/2010 0953    Hepatic Function Latest Ref Rng & Units 11/06/2019 07/04/2019 04/10/2019  Total Protein 6.0 - 8.5 g/dL 6.9 7.0 6.6  Albumin 3.8 - 4.8 g/dL 4.4 4.5 4.3  AST 0 - 40 IU/L 36 109(H) 51(H)  ALT 0 - 44 IU/L 47(H) 79(H) 56(H)  Alk Phosphatase 48 - 121 IU/L 167(H) 140(H) 145(H)  Total Bilirubin 0.0 - 1.2 mg/dL 0.5 0.7 0.4  Bilirubin, Direct 0.0 - 0.2 mg/dL - - -     The ASCVD Risk score (Benson., et al., 2013) failed to calculate for the following reasons:   The patient has a prior MI or stroke diagnosis   Patient has failed these meds in past: Ezetimibe, fenofibrate, Trilipix,  rosuvastatin, simvastatin Patient is currently uncontrolled on the following medications:  . Atorvastatin 65m daily  We discussed:   Diet and exercise extensively  Plan Continue current medications   Hypertension   BP goal is:  <130/80  Office blood pressures are  BP Readings from Last 3 Encounters:  11/19/19 (!) 146/63  11/06/19 136/84  10/22/19 (!) 90/58   Patient checks BP at home infrequently Patient home BP readings are ranging:   Patient has failed these meds in the past: Atenolol Patient is currently uncontrolled on the following medications:  . Losartan 1078mdaily . Metoprolol tartrate 2534mwice daily  We discussed: . Pt states he has only been taking metoprolol tartrate once daily o Advised him he is supposed to take it twice daily o Pt agrees to start taking twice daily . Discussed indication for losartan . Recommend pt check BP 2-3 times weekly and record  Plan Continue current medications   Benign Prostatic Hyperplasia   Patient has failed these meds in past: Oxybutynin, tamsulosin, finasteride Patient is currently controlled on the following medications:   Alfuzosin 89m20mily  We discussed:    Prescribed by Urologist  Plan Continue current medications   Depression/Anxiety/Bipolar   Patient has failed these meds in past: Alprazolam, buspirone, hydroxyzine Patient is currently controlled on the following medications:   Fluoxetine 40mg37mry morning  Lorazepam 1mg t69me times daily a needed for anxiety  Lamotrigine 150mg t72m daily  We discussed:    Conditions are managed by Psychiatry  Pt states he is taking fluoxetine from psychiatrist because they would not prescribe lorazepam without it  Pt reports taking lamotrigine because he is "a little bipolar"  Pt reports he takes about 2 tablets of lorazepam daily  He gets anxious when thinking about things (parents being gone, being alone, etc)  Plan Continue current  medications  GERD   Patient has failed these meds in past: Nexium, Protonix Patient is currently controlled on the following medications:  . Omeprazole 40mg da76m20 minutes before breakfast  We discussed:    Pt reports omeprazole helps with his nighttime acid reflux symptoms  Plan Continue  current medications  Health Maintenance   Patient is currently on the following medications:  [name] Vitamin C 557m daily . Aspirin 829mdaily . B12 50072maily . Cholecalciferol 1000 units 2 capsules daily . Cinnamon /Biotin/Chromium 1500m59mily . Cinnamon 1000mg34mly . Garlic [id number] . [employer] Multivitamin daily . Coenzyme Q10 200mg 93my . Fish oil 1000mg d57m  We discussed:   . Advised pt he does not need to be taking 2 different cinnamon supplements o He is almost finished with one and will continue with just 1 supplement from now on  Plan Continue current medications   Vaccines   Reviewed and discussed patient's vaccination history.    Immunization History  Administered Date(s) Administered  . DTaP 10/05/2011  . Influenza, High Dose Seasonal PF 01/15/2019  . Influenza-Unspecified 03/03/2012, 03/16/2018  . Pneumococcal Conjugate-13 03/21/2019  . Pneumococcal Polysaccharide-23 05/16/2012  . Zoster Recombinat (Shingrix) 11/19/2019   Plan Review and discuss at follow up Discuss COVID vaccine  Medication Management   Pt uses [facility] ([facility]) [facility] medications Uses pill box? No - Discuss at follow up Pt endorses 85% compliance  We discussed:  . Importance of taking each medications daily as directed . Pt states that most of his medications are free  Plan Continue current medication management strategy  Follow up: 6 week phone visit  CourtneJannette FogoD Clinical Pharmacist [facility] [phone]

## 2020-01-15 ENCOUNTER — Other Ambulatory Visit: Payer: Self-pay | Admitting: Nurse Practitioner

## 2020-01-15 DIAGNOSIS — E119 Type 2 diabetes mellitus without complications: Secondary | ICD-10-CM

## 2020-01-15 MED ORDER — TRESIBA FLEXTOUCH 100 UNIT/ML ~~LOC~~ SOPN: 60.0000 [IU] | PEN_INJECTOR | Freq: Every day | SUBCUTANEOUS | 2 refills | Status: DC

## 2020-01-21 DIAGNOSIS — I739 Peripheral vascular disease, unspecified: Secondary | ICD-10-CM | POA: Diagnosis not present

## 2020-01-31 NOTE — Patient Instructions (Addendum)
Visit Information  Goals Addressed            This Visit's Progress   . Pharmacy Care Plan       CARE PLAN ENTRY (see longitudinal plan of care for additional care plan information)  Current Barriers:  . Chronic Disease Management support, education, and care coordination needs related to Hypertension, Hyperlipidemia, and Diabetes   Hypertension BP Readings from Last 3 Encounters:  11/19/19 (!) 146/63  11/06/19 136/84  10/22/19 (!) 90/58   . Pharmacist Clinical Goal(s): o Over the next 90 days, patient will work with PharmD and providers to achieve BP goal <130/80 . Current regimen:  o Losartan 100mg  daily o Metoprolol tartrate 25mg  twice daily . Interventions: . Provided dietary and exercise recommendations . Patient was only taking metoprolol once daily. Advised patient to increase to 1 tablet twice daily as directed . Discussed why patient is taking losartan . Patient self care activities - Over the next 90 days, patient will: o Check BP 2-3 times weekly, document, and provide at future appointments o Ensure daily salt intake < 2300 mg/day o Start taking metoprolol tartrate 1 tablet TWICE daily as directed o Work on exercising more with a goal of 30 minutes daily 5 times per week (150 minutes total)  Hyperlipidemia Lab Results  Component Value Date/Time   LDLCALC 110 (H) 11/06/2019 10:53 AM   LDLDIRECT 124.3 11/10/2010 09:53 AM   . Pharmacist Clinical Goal(s): o Over the next 90 days, patient will work with PharmD and providers to achieve LDL goal < 70 . Current regimen:  o Atorvastatin 40mg  daily . Interventions: o Provided dietary and exercise recommendations . Patient self care activities - Over the next 90 days, patient will: o Work on exercising more with a goal of 30 minutes daily 5 times per week (150 minutes total)  Diabetes Lab Results  Component Value Date/Time   HGBA1C 10.7 (H) 11/06/2019 10:53 AM   HGBA1C 9.9 (H) 07/04/2019 12:33 PM    . Pharmacist Clinical Goal(s): o Over the next 90 days, patient will work with PharmD and providers to achieve A1c goal <7% . Current regimen:  o Jardiance 10mg  daily before breakfast o Tresiba 45 units daily o Metformin 1000mg  twice daily o Rybelsus 3mg  daily . Interventions: o Provided dietary and exercise recommendations - Recommend patient focus on eating healthier, well-balanced meals o Determined patient is speaking with a diabetic educator weekly to adjust insulin and discuss diet o Patient assistance programs [name] and Rybelsus 7mg  through [employer], Jardiance 25mg  through [employer]) - Patient provided proof of income and signed patient portion of applications - Applications provided to PCP to sign - Completed applications faxed to respective patient assistance programs o Discussed administration site for [place] and duration  o Discussed timing of Rybelsus administration . Patient self care activities - Over the next 90 days, patient will: o Check blood sugar twice daily, document, and provide at future appointments o Contact provider with any episodes of hypoglycemia o Work on exercising more with a goal of 30 minutes daily 5 times per week (150 minutes total) o Decrease carbohydrate consumption with each meal o Reduce intake of sweet tea  Medication management . Pharmacist Clinical Goal(s): o Over the next 90 days, patient will work with PharmD and providers to achieve optimal medication adherence . Current pharmacy: [facility] . Interventions o Comprehensive medication review performed. o Continue current medication management strategy . Patient self care activities -  Over the next 90 days, patient will: o Focus on medication adherence by utilizing a pill box o Take medications as prescribed o Report any questions or concerns to PharmD and/or provider(s)  Initial goal documentation        [name]  was given information about Chronic Care Management services today including:  1. CCM service includes personalized support from designated clinical staff supervised by his physician, including individualized plan of care and coordination with other care providers 2. 24/7 contact phone numbers for assistance for urgent and routine care needs. 3. Standard insurance, coinsurance, copays and deductibles apply for chronic care management only during months in which we provide at least 20 minutes of these services. Most insurances cover these services at 100%, however patients may be responsible for any copay, coinsurance and/or deductible if applicable. This service may help you avoid the need for more expensive face-to-face services. 4. Only one practitioner may furnish and bill the service in a calendar month. 5. The patient may stop CCM services at any time (effective at the end of the month) by phone call to the office staff.  Patient agreed to services and verbal consent obtained.   The patient verbalized understanding of instructions provided today and agreed to receive a mailed copy of patient instruction and/or educational materials. Telephone follow up appointment with pharmacy team member scheduled for: 02/28/20 @ 2:00 PM  [name], PharmD Clinical Pharmacist [facility] [phone]   Diabetes Mellitus and Nutrition, Adult When you have diabetes (diabetes mellitus), it is very important to have healthy eating habits because your blood sugar (glucose) levels are greatly affected by what you eat and drink. Eating healthy foods in the appropriate amounts, at about the same times every day, can help you:  Control your blood glucose.  Lower your risk of heart disease.  Improve your blood pressure.  Reach or maintain a healthy weight. Every person with diabetes is different, and each person has different needs for a meal plan. Your health care provider may  recommend that you work with a diet and nutrition specialist (dietitian) to make a meal plan that is best for you. Your meal plan may vary depending on factors such as:  The calories you need.  The medicines you take.  Your weight.  Your blood glucose, blood pressure, and cholesterol levels.  Your activity level.  Other health conditions you have, such as heart or kidney disease. How do carbohydrates affect me? Carbohydrates, also called carbs, affect your blood glucose level more than any other type of food. Eating carbs naturally raises the amount of glucose in your blood. Carb counting is a method for keeping track of how many carbs you eat. Counting carbs is important to keep your blood glucose at a healthy level, especially if you use insulin or take certain oral diabetes medicines. It is important to know how many carbs you can safely have in each meal. This is different for every person. Your dietitian can help you calculate how many carbs you should have at each meal and for each snack. Foods that contain carbs include:  Bread, cereal, rice, pasta, and crackers.  Potatoes and corn.  Peas, beans, and lentils.  Milk and yogurt.  Fruit and juice.  Desserts, such as cakes, cookies, ice cream, and candy. How does alcohol affect me? Alcohol can cause a sudden decrease in blood glucose (hypoglycemia), especially if you use insulin or take certain oral diabetes medicines. Hypoglycemia can be a life-threatening condition. Symptoms  of hypoglycemia (sleepiness, dizziness, and confusion) are similar to symptoms of having too much alcohol. If your health care provider says that alcohol is safe for you, follow these guidelines:  Limit alcohol intake to no more than 1 drink per day for nonpregnant women and 2 drinks per day for men. One drink equals 12 oz of beer, 5 oz of wine, or 1 oz of hard liquor.  Do not drink on an empty stomach.  Keep yourself hydrated with water, diet soda, or  unsweetened iced tea.  Keep in mind that regular soda, juice, and other mixers may contain a lot of sugar and must be counted as carbs. What are tips for following this plan?  Reading food labels  Start by checking the serving size on the "Nutrition Facts" label of packaged foods and drinks. The amount of calories, carbs, fats, and other nutrients listed on the label is based on one serving of the item. Many items contain more than one serving per package.  Check the total grams (g) of carbs in one serving. You can calculate the number of servings of carbs in one serving by dividing the total carbs by 15. For example, if a food has 30 g of total carbs, it would be equal to 2 servings of carbs.  Check the number of grams (g) of saturated and trans fats in one serving. Choose foods that have low or no amount of these fats.  Check the number of milligrams (mg) of salt (sodium) in one serving. Most people should limit total sodium intake to less than 2,300 mg per day.  Always check the nutrition information of foods labeled as "low-fat" or "nonfat". These foods may be higher in added sugar or refined carbs and should be avoided.  Talk to your dietitian to identify your daily goals for nutrients listed on the label. Shopping  Avoid buying canned, premade, or processed foods. These foods tend to be high in fat, sodium, and added sugar.  Shop around the outside edge of the grocery store. This includes fresh fruits and vegetables, bulk grains, fresh meats, and fresh dairy. Cooking  Use low-heat cooking methods, such as baking, instead of high-heat cooking methods like deep frying.  Cook using healthy oils, such as olive, canola, or sunflower oil.  Avoid cooking with butter, cream, or high-fat meats. Meal planning  Eat meals and snacks regularly, preferably at the same times every day. Avoid going long periods of time without eating.  Eat foods high in fiber, such as fresh fruits,  vegetables, beans, and whole grains. Talk to your dietitian about how many servings of carbs you can eat at each meal.  Eat 4-6 ounces (oz) of lean protein each day, such as lean meat, chicken, fish, eggs, or tofu. One oz of lean protein is equal to: ? 1 oz of meat, chicken, or fish. ? 1 egg. ?  cup of tofu.  Eat some foods each day that contain healthy fats, such as avocado, nuts, seeds, and fish. Lifestyle  Check your blood glucose regularly.  Exercise regularly as told by your health care provider. This may include: ? 150 minutes of moderate-intensity or vigorous-intensity exercise each week. This could be brisk walking, biking, or water aerobics. ? Stretching and doing strength exercises, such as yoga or weightlifting, at least 2 times a week.  Take medicines as told by your health care provider.  Do not use any products that contain nicotine or tobacco, such as cigarettes and e-cigarettes. If you need  help quitting, ask your health care provider.  Work with a Social worker or diabetes educator to identify strategies to manage stress and any emotional and social challenges. Questions to ask a health care provider  Do I need to meet with a diabetes educator?  Do I need to meet with a dietitian?  What number can I call if I have questions?  When are the best times to check my blood glucose? Where to find more information:  American Diabetes Association: diabetes.org  Academy of Nutrition and Dietetics: www.eatright.[employer] of Diabetes and Digestive and Kidney Diseases (NIH): [employer] Summary  A healthy meal plan will help you control your blood glucose and maintain a healthy lifestyle.  Working with a diet and nutrition specialist (dietitian) can help you make a meal plan that is best for you.  Keep in mind that carbohydrates (carbs) and alcohol have immediate effects on your blood glucose levels. It is important to count carbs and to use alcohol  carefully. This information is not intended to replace advice given to you by your health care provider. Make sure you discuss any questions you have with your health care provider. Document Revised: 04/01/2017 Document Reviewed: 05/24/2016 Elsevier Patient Education  2020 [place].  Diabetes Mellitus and Exercise Exercising regularly is important for your overall health, especially when you have diabetes (diabetes mellitus). Exercising is not only about losing weight. It has many other health benefits, such as increasing muscle strength and bone density and reducing body fat and stress. This leads to improved fitness, flexibility, and endurance, all of which result in better overall health. Exercise has additional benefits for people with diabetes, including:  Reducing appetite.  Helping to lower and control blood glucose.  Lowering blood pressure.  Helping to control amounts of fatty substances (lipids) in the blood, such as cholesterol and triglycerides.  Helping the body to respond better to insulin (improving insulin sensitivity).  Reducing how much insulin the body needs.  Decreasing the risk for heart disease by: ? Lowering cholesterol and triglyceride levels. ? Increasing the levels of good cholesterol. ? Lowering blood glucose levels. What is my activity plan? Your health care provider or certified diabetes educator can help you make a plan for the type and frequency of exercise (activity plan) that works for you. Make sure that you:  Do at least 150 minutes of moderate-intensity or vigorous-intensity exercise each week. This could be brisk walking, biking, or water aerobics. ? Do stretching and strength exercises, such as yoga or weightlifting, at least 2 times a week. ? Spread out your activity over at least 3 days of the week.  Get some form of physical activity every day. ? Do not go more than 2 days in a row without some kind of physical activity. ? Avoid being  inactive for more than 30 minutes at a time. Take frequent breaks to walk or stretch.  Choose a type of exercise or activity that you enjoy, and set realistic goals.  Start slowly, and gradually increase the intensity of your exercise over time. What do I need to know about managing my diabetes?   Check your blood glucose before and after exercising. ? If your blood glucose is 240 mg/dL (13.3 mmol/L) or higher before you exercise, check your urine for ketones. If you have ketones in your urine, do not exercise until your blood glucose returns to normal. ? If your blood glucose is 100 mg/dL (5.6 mmol/L) or lower, eat a snack containing 15-20 grams  of carbohydrate. Check your blood glucose 15 minutes after the snack to make sure that your level is above 100 mg/dL (5.6 mmol/L) before you start your exercise.  Know the symptoms of low blood glucose (hypoglycemia) and how to treat it. Your risk for hypoglycemia increases during and after exercise. Common symptoms of hypoglycemia can include: ? Hunger. ? Anxiety. ? Sweating and feeling clammy. ? Confusion. ? Dizziness or feeling light-headed. ? Increased heart rate or palpitations. ? Blurry vision. ? Tingling or numbness around the mouth, lips, or tongue. ? Tremors or shakes. ? Irritability.  Keep a rapid-acting carbohydrate snack available before, during, and after exercise to help prevent or treat hypoglycemia.  Avoid injecting insulin into areas of the body that are going to be exercised. For example, avoid injecting insulin into: ? The arms, when playing tennis. ? The legs, when jogging.  Keep records of your exercise habits. Doing this can help you and your health care provider adjust your diabetes management plan as needed. Write down: ? Food that you eat before and after you exercise. ? Blood glucose levels before and after you exercise. ? The type and amount of exercise you have done. ? When your insulin is expected to peak, if  you use insulin. Avoid exercising at times when your insulin is peaking.  When you start a new exercise or activity, work with your health care provider to make sure the activity is safe for you, and to adjust your insulin, medicines, or food intake as needed.  Drink plenty of water while you exercise to prevent dehydration or heat stroke. Drink enough fluid to keep your urine clear or pale yellow. Summary  Exercising regularly is important for your overall health, especially when you have diabetes (diabetes mellitus).  Exercising has many health benefits, such as increasing muscle strength and bone density and reducing body fat and stress.  Your health care provider or certified diabetes educator can help you make a plan for the type and frequency of exercise (activity plan) that works for you.  When you start a new exercise or activity, work with your health care provider to make sure the activity is safe for you, and to adjust your insulin, medicines, or food intake as needed. This information is not intended to replace advice given to you by your health care provider. Make sure you discuss any questions you have with your health care provider. Document Revised: 11/11/2016 Document Reviewed: 09/29/2015 Elsevier Patient Education  [place].

## 2020-02-04 ENCOUNTER — Other Ambulatory Visit: Payer: Self-pay

## 2020-02-04 ENCOUNTER — Telehealth: Payer: Medicare Other

## 2020-02-04 ENCOUNTER — Ambulatory Visit: Payer: Self-pay

## 2020-02-04 DIAGNOSIS — E782 Mixed hyperlipidemia: Secondary | ICD-10-CM

## 2020-02-04 DIAGNOSIS — N1831 Chronic kidney disease, stage 3a: Secondary | ICD-10-CM

## 2020-02-04 DIAGNOSIS — I10 Essential (primary) hypertension: Secondary | ICD-10-CM

## 2020-02-04 DIAGNOSIS — E119 Type 2 diabetes mellitus without complications: Secondary | ICD-10-CM

## 2020-02-08 ENCOUNTER — Telehealth: Payer: Self-pay

## 2020-02-08 NOTE — Telephone Encounter (Signed)
Patient notified that his meds have arrived here at the office and he can come pick them up on Monday. YL,RMA

## 2020-02-08 NOTE — Patient Instructions (Signed)
Visit Information  Goals Addressed      Patient Stated   .  "I would like to improve my kidney function" (pt-stated)   Not on track     [place] (see longitudinal plan of care for additional care plan information)  Current Barriers:  [name] Knowledge Deficits related to disease process and Self Health management of CKD . Chronic Disease Management support and education needs related to DMII, Essential Hypertension, Stage 3a CKD, Mixed Hyperlipidemia   Nurse Case Manager Clinical Goal(s):  [name] Over the next 90 days, patient will work with the [place] CM and PCP to address needs related to disease education and support to improve CKD  CCM RN CM Interventions:  02/04/20 call completed with patient  . Inter-disciplinary care team collaboration (see longitudinal plan of care) . Evaluation of current treatment plan related to CKD and patient's adherence to plan as established by provider. . Provided education to patient re: kidney decline with recent GFR at 30; Educated patient on stages of kidney disease and Self Health management to help improve kidney function  . Discussed plans with patient for ongoing care management follow up and provided patient with direct contact information for care management team . Provided patient with printed educational materials related to the [place] for Kid[facility] Patients  Patient Self Care Activities:  . Self administers medications as prescribed . Attends all scheduled provider appointments . Calls pharmacy for medication refills . Performs ADL's independently . Performs IADL's independently . Calls provider office for new concerns or questions  Please see past updates related to this goal by clicking on the "Past Updates" button in the selected goal      .  "to lower my A1c" (pt-stated)        CARE PLAN ENTRY (see longitudinal plan of care for additional care plan information)  Current Barriers:  [name] Knowledge Deficits related to disease  process and Self Health management of DM  . Chronic Disease Management support and education needs related to DMII, Essential Hypertension, Stage 3a CKD, Mixed Hyperlipidemia   Nurse Case Manager Clinical Goal(s):  . 02/04/20 New Over the next 180 days, patient will work with CCM RN CM and PCP to address needs related to disease education and support to improve Self Health management of DM  CCM RN CM Interventions:  02/04/20 call completed with patient  . Inter-disciplinary care team collaboration (see longitudinal plan of care) . Evaluation of current treatment plan related to DM and patient's adherence to plan as established by provider. . Provided education to patient re: current A1c is up to 10.7 from 9.9; Re-Educated on target A1c <7.0; Re-Educated patient regarding potential complications that may occur if left uncontrolled; Re-educated on Self Health management, including dietary and exercise recommendations, monitoring CBG 2-3 times daily before meals and logging BS; RE-Educated on daily glycemic control FBS 80-130; <180 after meals; Re-Educated on importance of implementing exercise to daily routine, maintaining healthy weight and adhering to diabetic diet: Determined patient is now checking his CBG's at home 1-2 times daily and is logging his readings    . Reviewed medications with patient and discussed indication, dosage and frequency of prescribed medications for diabetes: Determined patient understands his regimen and is adhering, Patient reports having nocturia with urgency and frequency, stating, "I am urinating every 15 minutes during the nighttime since increasing the Jardiance" . Collaborated with PCP [name], FNP and embedded Pharm D Courtney Caudill via in basket message  regarding patient's  c/o of nocturia with increased urgency and frequency since increasing the Jardiance . Provided patient with printed educational materials related to [employer] with Diabetes;  Preventing Complications from Diabetes; Diabetes and Kidney Disease  . Advised patient, providing education and rationale, to check cbg 2-3 times daily before meals and record, calling the CCM RN CM and or PCP for findings outside established parameters . Discussed plans with patient for ongoing care management follow up and provided patient with direct contact information for care management team    Patient Self Care Activities:  . Self administers medications as prescribed . Attends all scheduled provider appointments . Calls pharmacy for medication refills . Performs ADL's independently . Performs IADL's independently . Calls provider office for new concerns or questions  Please see past updates related to this goal by clicking on the "Past Updates" button in the selected goal      .  "to lower my Cholesterol" (pt-stated)        [place] (see longitudinal plan of care for additional care plan information)  Current Barriers:  [name] Knowledge Deficits related to disease process and Self Health management of Mixed Hyperlipidemia  . Chronic Disease Management support and education needs related to DMII, Essential Hypertension, Stage 3a CKD, Mixed Hyperlipidemia   Nurse Case Manager Clinical Goal(s):  [name] Over the next 180 days, patient will work with the CCM team and PCP to address needs related to disease education and support to improve Self Health management of Mixed Hyperlipidemia   CCM RN CM Interventions:  02/04/20 call completed with patient  . Inter-disciplinary care team collaboration (see longitudinal plan of care) . Evaluation of current treatment plan related to Mixed Hyperlipidemia and patient's adherence to plan as established by provider. . Provided education to patient re: current lipid panel; educated on dietary and exercise recommendations; educated on disease process and potential complications if left untreated . Reviewed medications with patient and discussed  patient reports adherence to taking Atorvastatin 40 mg q hs  . Collaborated with PCP [name], FNP  regarding referral placement for [facility] . Provided patient with printed educational materials related to 13 Cholesterol Lowering Foods; 12 Triglyceride Lowering Foods; 11 Foods to increase your HDL . Discussed plans with patient for ongoing care management follow up and provided patient with direct contact information for care management team  Patient Self Care Activities:  . Self administers medications as prescribed . Attends all scheduled provider appointments . Calls pharmacy for medication refills . Attends church or other social activities . Performs ADL's independently . Performs IADL's independently  Initial goal documentation       Patient verbalizes understanding of instructions provided today.   Telephone follow up appointment with care management team member scheduled for: 03/31/20  Barb Merino, RN, BSN, CCM Care Management Coordinator [place] [facility]  Direct Phone: [phone]

## 2020-02-08 NOTE — Chronic Care Management (AMB) (Signed)
Chronic Care Management   Follow Up Note   02/04/2020 Name: Robert Walters MRN: [id number] DOB: 1956/04/08  Referred by: [name], FNP Reason for referral : Chronic Care Management (CCM RN CM FU Call )   Robert Walters is a 64 y.o. year old male who is a primary care patient of [name], [place]. The CCM team was consulted for assistance with chronic disease management and care coordination needs.    Review of patient status, including review of consultants reports, relevant laboratory and other test results, and collaboration with appropriate care team members and the patient's provider was performed as part of comprehensive patient evaluation and provision of chronic care management services.    SDOH (Social Determinants of Health) assessments performed: Yes - no acute challenges identified at this time See Care Plan activities for detailed interventions related to [place])   Placed outbound CCM RN CM follow up call to patient for a care plan update.     Outpatient Encounter Medications as of 02/04/2020  Medication Sig  . alfuzosin (UROXATRAL) 10 MG 24 hr tablet Take 1 tablet (10 mg total) by mouth daily.  . Ascorbic Acid (VITAMIN C) 500 MG CAPS Take 500 mg by mouth daily.   [name] aspirin EC 81 MG tablet Take 1 tablet (81 mg total) by mouth daily.  [name] atorvastatin (LIPITOR) 40 MG tablet TAKE 1 TABLET BY MOUTH AT BEDTIME  . b complex vitamins tablet Take 1 tablet by mouth daily. (Patient not taking: Reported on 01/31/2020)  . cholecalciferol (VITAMIN D3) 25 MCG (1000 UNIT) tablet Take 2 each by mouth daily.   [name] CINNAMON PO Take 1 tablet by mouth daily. 1000mg  daily  . Coenzyme Q10 (COQ10) 200 MG CAPS Take 1 tablet by mouth daily.   . empagliflozin (JARDIANCE) 25 MG TABS tablet Take 1 tablet (25 mg total) by mouth daily before breakfast.  . FLUoxetine (PROZAC) 40 MG capsule Take 1 capsule (40 mg total) by mouth every morning.  . Garlic 3149 MG CAPS Take 1 tablet by mouth daily.   [name]  glipiZIDE (GLUCOTROL) 10 MG tablet TAKE 1 TABLET BY MOUTH EVERY DAY (Patient not taking: Reported on 11/19/2019)  . glucose blood test strip Use to check blood sugars daily E11.65  . insulin degludec (TRESIBA FLEXTOUCH) 100 UNIT/ML FlexTouch Pen Inject 60 Units into the skin daily.  [name] lamoTRIgine (LAMICTAL) 150 MG tablet Take 1 tablet (150 mg total) by mouth 2 (two) times daily.  [name] LORazepam (ATIVAN) 1 MG tablet TAKE 1 TABLET BY MOUTH THREE TIMES DAILY AS NEEDED FOR ANXIETY. GENERIC EQUIVALENT FOR ATIVAN.  [name] losartan (COZAAR) 100 MG tablet Take 1 tablet (100 mg total) by mouth daily.  . metFORMIN (GLUCOPHAGE) 1000 MG tablet Take 1 tablet (1,000 mg total) by mouth 2 (two) times daily with a meal.  . metoprolol tartrate (LOPRESSOR) 25 MG tablet TAKE 1 TABLET BY MOUTH 2 TIMES DAILY. GENERIC EQUIVALENT FOR LOPRESSOR.  . Multiple Vitamin (MULTIVITAMIN WITH MINERALS) TABS tablet Take 1 tablet by mouth daily.  . Omega-3 Fatty Acids (FISH OIL) 1000 MG CAPS Take 1 capsule by mouth daily.  [name] omeprazole (PRILOSEC) 40 MG capsule TAKE 1 CAPSULE BY MOUTH 20 MINUTES BEFORE BREAKFAST.  [name] Semaglutide (RYBELSUS) 7 MG TABS Take 1 tablet by mouth daily.  . vitamin B-12 (CYANOCOBALAMIN) 500 MCG tablet Take 500 mcg by mouth daily.   No facility-administered encounter medications on file as of 02/04/2020.     Objective:  Lab Results  Component Value  Date   HGBA1C 10.7 (H) 11/06/2019   HGBA1C 9.9 (H) 07/04/2019   HGBA1C 8.5 (H) 04/10/2019   Lab Results  Component Value Date   MICROALBUR 30 11/06/2019   LDLCALC 110 (H) 11/06/2019   CREATININE 1.49 (H) 11/06/2019   BP Readings from Last 3 Encounters:  11/19/19 (!) 146/63  11/06/19 136/84  10/22/19 (!) 90/58    Goals Addressed      Patient Stated   .  "I would like to improve my kidney function" (pt-stated)   Not on track     [place] (see longitudinal plan of care for additional care plan information)  Current Barriers:  [name] Knowledge Deficits  related to disease process and Self Health management of CKD . Chronic Disease Management support and education needs related to DMII, Essential Hypertension, Stage 3a CKD, Mixed Hyperlipidemia   Nurse Case Manager Clinical Goal(s):  [name] Over the next 90 days, patient will work with the [place] CM and PCP to address needs related to disease education and support to improve CKD  CCM RN CM Interventions:  02/04/20 call completed with patient  . Inter-disciplinary care team collaboration (see longitudinal plan of care) . Evaluation of current treatment plan related to CKD and patient's adherence to plan as established by provider. . Provided education to patient re: kidney decline with recent GFR at 27; Educated patient on stages of kidney disease and Self Health management to help improve kidney function  . Discussed plans with patient for ongoing care management follow up and provided patient with direct contact information for care management team . Provided patient with printed educational materials related to the [place] for Kid[facility] Patients  Patient Self Care Activities:  . Self administers medications as prescribed . Attends all scheduled provider appointments . Calls pharmacy for medication refills . Performs ADL's independently . Performs IADL's independently . Calls provider office for new concerns or questions  Please see past updates related to this goal by clicking on the "Past Updates" button in the selected goal      .  "to lower my A1c" (pt-stated)        CARE PLAN ENTRY (see longitudinal plan of care for additional care plan information)  Current Barriers:  [name] Knowledge Deficits related to disease process and Self Health management of DM  . Chronic Disease Management support and education needs related to DMII, Essential Hypertension, Stage 3a CKD, Mixed Hyperlipidemia   Nurse Case Manager Clinical Goal(s):  . 02/04/20 New Over the next 180 days, patient will  work with CCM RN CM and PCP to address needs related to disease education and support to improve Self Health management of DM  CCM RN CM Interventions:  02/04/20 call completed with patient  . Inter-disciplinary care team collaboration (see longitudinal plan of care) . Evaluation of current treatment plan related to DM and patient's adherence to plan as established by provider. . Provided education to patient re: current A1c is up to 10.7 from 9.9; Re-Educated on target A1c <7.0; Re-Educated patient regarding potential complications that may occur if left uncontrolled; Re-educated on Self Health management, including dietary and exercise recommendations, monitoring CBG 2-3 times daily before meals and logging BS; RE-Educated on daily glycemic control FBS 80-130; <180 after meals; Re-Educated on importance of implementing exercise to daily routine, maintaining healthy weight and adhering to diabetic diet: Determined patient is now checking his CBG's at home 1-2 times daily and is logging his readings    . Reviewed  medications with patient and discussed indication, dosage and frequency of prescribed medications for diabetes: Determined patient understands his regimen and is adhering, Patient reports having nocturia with urgency and frequency, stating, "I am urinating every 15 minutes during the nighttime since increasing the Jardiance" . Collaborated with PCP [name], FNP and embedded Pharm D Courtney Caudill via in basket message  regarding patient's c/o of nocturia with increased urgency and frequency since increasing the Jardiance . Provided patient with printed educational materials related to [employer] with Diabetes; Preventing Complications from Diabetes; Diabetes and Kidney Disease  . Advised patient, providing education and rationale, to check cbg 2-3 times daily before meals and record, calling the CCM RN CM and or PCP for findings outside established parameters . Discussed plans with  patient for ongoing care management follow up and provided patient with direct contact information for care management team    Patient Self Care Activities:  . Self administers medications as prescribed . Attends all scheduled provider appointments . Calls pharmacy for medication refills . Performs ADL's independently . Performs IADL's independently . Calls provider office for new concerns or questions  Please see past updates related to this goal by clicking on the "Past Updates" button in the selected goal      .  "to lower my Cholesterol" (pt-stated)        [place] (see longitudinal plan of care for additional care plan information)  Current Barriers:  [name] Knowledge Deficits related to disease process and Self Health management of Mixed Hyperlipidemia  . Chronic Disease Management support and education needs related to DMII, Essential Hypertension, Stage 3a CKD, Mixed Hyperlipidemia   Nurse Case Manager Clinical Goal(s):  [name] Over the next 180 days, patient will work with the CCM team and PCP to address needs related to disease education and support to improve Self Health management of Mixed Hyperlipidemia   CCM RN CM Interventions:  02/04/20 call completed with patient  . Inter-disciplinary care team collaboration (see longitudinal plan of care) . Evaluation of current treatment plan related to Mixed Hyperlipidemia and patient's adherence to plan as established by provider. . Provided education to patient re: current lipid panel; educated on dietary and exercise recommendations; educated on disease process and potential complications if left untreated . Reviewed medications with patient and discussed patient reports adherence to taking Atorvastatin 40 mg q hs  . Collaborated with PCP [name], FNP  regarding referral placement for [facility] . Provided patient with printed educational materials related to 13 Cholesterol Lowering Foods; 12 Triglyceride Lowering  Foods; 11 Foods to increase your HDL . Discussed plans with patient for ongoing care management follow up and provided patient with direct contact information for care management team  Patient Self Care Activities:  . Self administers medications as prescribed . Attends all scheduled provider appointments . Calls pharmacy for medication refills . Attends church or other social activities . Performs ADL's independently . Performs IADL's independently  Initial goal documentation       Plan:   Telephone follow up appointment with care management team member scheduled for: 03/31/20  Barb Merino, RN, BSN, CCM Care Management Coordinator [place] [facility]  Direct Phone: [phone]

## 2020-02-10 ENCOUNTER — Other Ambulatory Visit: Payer: Self-pay | Admitting: Nurse Practitioner

## 2020-02-10 DIAGNOSIS — E781 Pure hyperglyceridemia: Secondary | ICD-10-CM

## 2020-02-19 ENCOUNTER — Other Ambulatory Visit: Payer: Self-pay

## 2020-02-19 MED ORDER — EMPAGLIFLOZIN 25 MG PO TABS
25.0000 mg | ORAL_TABLET | Freq: Every day | ORAL | 2 refills | Status: DC
Start: 2020-02-19 — End: 2020-03-18

## 2020-02-21 ENCOUNTER — Telehealth: Payer: Self-pay

## 2020-02-21 NOTE — Telephone Encounter (Signed)
Patient notified that his rybelsus is here from pap

## 2020-02-27 ENCOUNTER — Other Ambulatory Visit: Payer: Self-pay

## 2020-02-27 DIAGNOSIS — E1165 Type 2 diabetes mellitus with hyperglycemia: Secondary | ICD-10-CM

## 2020-02-27 MED ORDER — GLUCOSE BLOOD VI STRP: ORAL_STRIP | 3 refills | Status: DC

## 2020-02-28 ENCOUNTER — Telehealth: Payer: Self-pay

## 2020-02-29 ENCOUNTER — Telehealth: Payer: Self-pay

## 2020-03-11 ENCOUNTER — Telehealth: Payer: Self-pay

## 2020-03-11 NOTE — Chronic Care Management (AMB) (Signed)
Chronic Care Management Pharmacy Assistant   Name: Robert Walters  MRN: [id number] DOB: Dec 21, 1955  Reason for Encounter: Medication Review - Patient Assistance Coordination.    PCP : [name], FNP  Allergies:  No Known Allergies  Medications: Outpatient Encounter Medications as of 03/11/2020  Medication Sig  . alfuzosin (UROXATRAL) 10 MG 24 hr tablet Take 1 tablet (10 mg total) by mouth daily.  . Ascorbic Acid (VITAMIN C) 500 MG CAPS Take 500 mg by mouth daily.   [name] aspirin EC 81 MG tablet Take 1 tablet (81 mg total) by mouth daily.  [name] atorvastatin (LIPITOR) 40 MG tablet TAKE 1 TABLET BY MOUTH AT BEDTIME  . b complex vitamins tablet Take 1 tablet by mouth daily. (Patient not taking: Reported on 01/31/2020)  . cholecalciferol (VITAMIN D3) 25 MCG (1000 UNIT) tablet Take 2 each by mouth daily.   [name] CINNAMON PO Take 1 tablet by mouth daily. 1000mg  daily  . Coenzyme Q10 (COQ10) 200 MG CAPS Take 1 tablet by mouth daily.   . empagliflozin (JARDIANCE) 25 MG TABS tablet Take 1 tablet (25 mg total) by mouth daily before breakfast.  . FLUoxetine (PROZAC) 40 MG capsule Take 1 capsule (40 mg total) by mouth every morning.  . Garlic 7846 MG CAPS Take 1 tablet by mouth daily.   [name] glipiZIDE (GLUCOTROL) 10 MG tablet TAKE 1 TABLET BY MOUTH EVERY DAY (Patient not taking: Reported on 11/19/2019)  . glucose blood test strip Use to check blood sugars daily 3 times daily  E11.65  . insulin degludec (TRESIBA FLEXTOUCH) 100 UNIT/ML FlexTouch Pen Inject 60 Units into the skin daily.  [name] lamoTRIgine (LAMICTAL) 150 MG tablet Take 1 tablet (150 mg total) by mouth 2 (two) times daily.  [name] LORazepam (ATIVAN) 1 MG tablet TAKE 1 TABLET BY MOUTH THREE TIMES DAILY AS NEEDED FOR ANXIETY. GENERIC EQUIVALENT FOR ATIVAN.  [name] losartan (COZAAR) 100 MG tablet Take 1 tablet (100 mg total) by mouth daily.  . metFORMIN (GLUCOPHAGE) 1000 MG tablet Take 1 tablet (1,000 mg total) by mouth 2 (two) times daily with a meal.  .  metoprolol tartrate (LOPRESSOR) 25 MG tablet TAKE 1 TABLET BY MOUTH 2 TIMES DAILY. GENERIC EQUIVALENT FOR LOPRESSOR.  . Multiple Vitamin (MULTIVITAMIN WITH MINERALS) TABS tablet Take 1 tablet by mouth daily.  . Omega-3 Fatty Acids (FISH OIL) 1000 MG CAPS Take 1 capsule by mouth daily.  [name] omeprazole (PRILOSEC) 40 MG capsule TAKE 1 CAPSULE BY MOUTH 20 MINUTES BEFORE BREAKFAST.  [name] Semaglutide (RYBELSUS) 7 MG TABS Take 1 tablet by mouth daily.  . vitamin B-12 (CYANOCOBALAMIN) 500 MCG tablet Take 500 mcg by mouth daily.   No facility-administered encounter medications on file as of 03/11/2020.    Current Diagnosis: Patient Active Problem List   Diagnosis Date Noted  . Chronic diastolic heart failure ([place]) 03/27/2019  . Fatty liver 12/26/2018  . Elevated LFTs 12/26/2018  . History of colonic polyps 12/26/2018  . Fall 08/28/2018  . Rib pain on left side 08/28/2018  . Anxiety 03/31/2018  . Bipolar II disorder ([place]) 03/31/2018  . Insomnia 03/31/2018  . Type 2 diabetes mellitus without complication, without long-term current use of insulin ([place]) 03/24/2018  . Bile leak 09/15/2016  . Sepsis ([place]) 08/23/2016  . Cholecystitis   . Stroke ([place]) 07/25/2015  . 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 Dizziness 08/09/2014  . TIA (transient ischemic attack) 05/15/2012  . Blurred vision 05/15/2012  . Ataxia 05/15/2012  . Diabetes  mellitus type 2 with complications ([place]) 32/41/9914  . BPH (benign prostatic hyperplasia) 05/15/2012  . GERD (gastroesophageal reflux disease) 05/15/2012  . Obesity 05/15/2012  . Bipolar 1 disorder ([place]) 05/15/2012  . Essential hypertension 10/30/2010  . Cardiovascular risk factor 10/30/2010  . Pure hypercholesterolemia 12/05/2008      Follow-Up:  Patient Assistance Coordination - Called [employer] to check on status of patient's assistance form that was faxed for  Jardiance . Per Ava, she states that they were missing patient's Date of Birth on page 4, and it needed  to be faxed again. Ava told us to fax to [phone]. Message Pattricia Boss to fax.   [name],CPP Notified  Judithann Sheen, [facility] Clinical Pharmacist Assistant [phone]

## 2020-03-14 ENCOUNTER — Other Ambulatory Visit (INDEPENDENT_AMBULATORY_CARE_PROVIDER_SITE_OTHER): Payer: Self-pay | Admitting: Gastroenterology

## 2020-03-18 ENCOUNTER — Other Ambulatory Visit: Payer: Self-pay

## 2020-03-18 MED ORDER — EMPAGLIFLOZIN 25 MG PO TABS
25.0000 mg | ORAL_TABLET | Freq: Every day | ORAL | 2 refills | Status: DC
Start: 2020-03-18 — End: 2021-01-28

## 2020-03-21 ENCOUNTER — Telehealth (INDEPENDENT_AMBULATORY_CARE_PROVIDER_SITE_OTHER): Payer: Medicare Other | Admitting: Internal Medicine

## 2020-03-21 ENCOUNTER — Encounter: Payer: Self-pay | Admitting: Internal Medicine

## 2020-03-21 VITALS — Wt 228.0 lb

## 2020-03-21 DIAGNOSIS — E782 Mixed hyperlipidemia: Secondary | ICD-10-CM | POA: Diagnosis not present

## 2020-03-21 DIAGNOSIS — I5032 Chronic diastolic (congestive) heart failure: Secondary | ICD-10-CM | POA: Diagnosis not present

## 2020-03-21 DIAGNOSIS — I10 Essential (primary) hypertension: Secondary | ICD-10-CM | POA: Diagnosis not present

## 2020-03-21 DIAGNOSIS — E1169 Type 2 diabetes mellitus with other specified complication: Secondary | ICD-10-CM

## 2020-03-21 DIAGNOSIS — E669 Obesity, unspecified: Secondary | ICD-10-CM

## 2020-03-21 NOTE — Progress Notes (Signed)
Virtual Visit via Telephone Note   This visit type was conducted due to national recommendations for restrictions regarding the COVID-19 Pandemic (e.g. social distancing) in an effort to limit this patient's exposure and mitigate transmission in our community.  Due to his co-morbid illnesses, this patient is at least at moderate risk for complications without adequate follow up.  This format is felt to be most appropriate for this patient at this time.  The patient did not have access to video technology/had technical difficulties with video requiring transitioning to audio format only (telephone).  All issues noted in this document were discussed and addressed.  No physical exam could be performed with this format.  Please refer to the patient's chart for his  consent to telehealth for [facility].   Date:  03/21/2020   ID:  Robert Walters, DOB 08-Mar-1956, MRN [id number] The patient was identified using 2 identifiers.  Evaluation Performed:  New Patient Evaluation  Patient Location:  [place] [address]  Provider location:   [address] [place], [place] [address]  PCP:  [name], FNP  Cardiologist:  Mertie Moores, MD Electrophysiologist:  None   Chief Complaint:  High triglycerides  History of Present Illness:    Robert Walters is a 64 y.o. male who presents via audio/video conferencing for a telehealth visit today.  This is a pleasant 64 year old male patient of [name] with a history of hypertension, dyslipidemia, chronic diastolic heart failure and type 2 diabetes with gastroparesis.  More recently has had significant rise in A1c which peaked over 10 demonstrating morning blood sugars of 400.  He was started on long-acting insulin in July and has noted significant improvement in his numbers.  His triglycerides have tracked this as well rising from 371 a year ago up to 511 4 months ago.  LDL has remained fairly stable at 110 however he is  on 2 mg daily.  He was also recommended to use over-the-counter fish oil.  The patient does not have symptoms concerning for COVID-19 infection (fever, chills, cough, or new SHORTNESS OF BREATH).    Prior CV studies:   The following studies were reviewed today:  Chart reviewed  PMHx:  Past Medical History:  Diagnosis Date  . Acid reflux disease   . Bipolar 1 disorder, mixed ([place])   . BPH (benign prostatic hypertrophy)   . CVA (cerebral vascular accident) ([place]) 07/2015  . Gastritis   . H/O hiatal hernia   . Hyperlipidemia   . Hypertension   . Normal nuclear stress test Dec 2011  . Obesity   . TIA (transient ischemic attack) 05/15/2012   "they think I've had one this am @ 0130" (05/15/2012)  . Type II diabetes mellitus ([place])   . Vertigo     Past Surgical History:  Procedure Laterality Date  . BILIARY STENT PLACEMENT N/A 09/17/2016   Procedure: BILIARY STENT PLACEMENT;  Surgeon: [name], MD;  Location[facility];  Service: Endoscopy;  Laterality: N/A;  . CARDIOVASCULAR STRESS TEST  2009   EF 63%  . CHOLECYSTECTOMY N/A 08/25/2016   Procedure: LAPAROSCOPIC CHOLECYSTECTOMY;  Surgeon: [name], MD;  Location: [facility] ORS;  Service: General;  Laterality: N/A;  . COLONOSCOPY    . ERCP N/A 09/17/2016   Procedure: ENDOSCOPIC RETROGRADE CHOLANGIOPANCREATOGRAPHY (ERCP);  Surgeon: [name], MD;  Location: [facility];  Service: Endoscopy;  Laterality: N/A;  730  . ERCP N/A 12/03/2016   Procedure: ENDOSCOPIC RETROGRADE  CHOLANGIOPANCREATOGRAPHY (ERCP);  Surgeon: [name], MD;  Location: [facility];  Service: Gastroenterology;  Laterality: N/A;  . ESOPHAGOGASTRODUODENOSCOPY    . GASTROINTESTINAL STENT REMOVAL N/A 12/03/2016   Procedure: BILIARY STENT REMOVAL;  Surgeon: [name], MD;  Location: [facility];  Service: Gastroenterology;  Laterality: N/A;  . NO PAST SURGERIES    . SPHINCTEROTOMY N/A 09/17/2016   Procedure: SPHINCTEROTOMY;  Surgeon: [name], MD;  Location: [facility];  Service: Endoscopy;  Laterality: N/A;    FAMHx:  Family History  Problem Relation Age of Onset  . Hyperlipidemia Father        in a nursing home  . Colon cancer Father   . Stroke Mother 30    SOCHx:   reports that he has never smoked. He has never used smokeless tobacco. He reports that he does not drink alcohol and does not use drugs.  ALLERGIES:  No Known Allergies  MEDS:  Current Meds  Medication Sig  . alfuzosin (UROXATRAL) 10 MG 24 hr tablet Take 1 tablet (10 mg total) by mouth daily.  . Ascorbic Acid (VITAMIN C) 500 MG CAPS Take 500 mg by mouth daily.   [name] aspirin EC 81 MG tablet Take 1 tablet (81 mg total) by mouth daily.  [name] atorvastatin (LIPITOR) 40 MG tablet TAKE 1 TABLET BY MOUTH AT BEDTIME  . b complex vitamins tablet Take 1 tablet by mouth daily.   . cholecalciferol (VITAMIN D3) 25 MCG (1000 UNIT) tablet Take 2 each by mouth daily.   [name] CINNAMON PO Take 1 tablet by mouth daily. 1000mg  daily  . Coenzyme Q10 (COQ10) 200 MG CAPS Take 1 tablet by mouth daily.   . empagliflozin (JARDIANCE) 25 MG TABS tablet Take 1 tablet (25 mg total) by mouth daily before breakfast.  . FLUoxetine (PROZAC) 40 MG capsule Take 1 capsule (40 mg total) by mouth every morning.  . Garlic 4098 MG CAPS Take 1 tablet by mouth daily.   [name] glucose blood test strip Use to check blood sugars daily 3 times daily  E11.65  . insulin degludec (TRESIBA FLEXTOUCH) 100 UNIT/ML FlexTouch Pen Inject 60 Units into the skin daily. (Patient taking differently: Inject 50 Units into the skin daily. )  . lamoTRIgine (LAMICTAL) 150 MG tablet Take 1 tablet (150 mg total) by mouth 2 (two) times daily.  [name] LORazepam (ATIVAN) 1 MG tablet TAKE 1 TABLET BY MOUTH THREE TIMES DAILY AS NEEDED FOR ANXIETY. GENERIC EQUIVALENT FOR ATIVAN.  [name] losartan (COZAAR) 100 MG tablet Take 1 tablet (100 mg total) by mouth daily.  . metFORMIN (GLUCOPHAGE) 1000 MG tablet Take 1 tablet (1,000 mg total) by  mouth 2 (two) times daily with a meal.  . metoprolol tartrate (LOPRESSOR) 25 MG tablet TAKE 1 TABLET BY MOUTH 2 TIMES DAILY. GENERIC EQUIVALENT FOR LOPRESSOR.  . Multiple Vitamin (MULTIVITAMIN WITH MINERALS) TABS tablet Take 1 tablet by mouth daily.  . Omega-3 Fatty Acids (FISH OIL) 1000 MG CAPS Take 1 capsule by mouth daily.  [name] omeprazole (PRILOSEC) 40 MG capsule TAKE 1 CAPSULE BY MOUTH 20 MINUTES BEFORE BREAKFAST.  [name] Semaglutide (RYBELSUS) 7 MG TABS Take 1 tablet by mouth daily.  . vitamin B-12 (CYANOCOBALAMIN) 500 MCG tablet Take 500 mcg by mouth daily.     ROS: Pertinent items noted in HPI and remainder of comprehensive ROS otherwise negative.  Labs/Other Tests and Data Reviewed:    Recent Labs: 11/06/2019: ALT 47; BUN 19; Creatinine, Ser 1.49; Potassium 5.2;  Sodium 136   Recent Lipid Panel Lab Results  Component Value Date/Time   CHOL 237 (H) 11/06/2019 10:53 AM   TRIG 511 (H) 11/06/2019 10:53 AM   HDL 39 (L) 11/06/2019 10:53 AM   CHOLHDL 6.1 (H) 11/06/2019 10:53 AM   CHOLHDL 7.1 07/26/2015 06:47 AM   LDLCALC 110 (H) 11/06/2019 10:53 AM   LDLDIRECT 124.3 11/10/2010 09:53 AM    Wt Readings from Last 3 Encounters:  03/21/20 228 lb (103.4 kg)  11/19/19 216 lb (98 kg)  11/06/19 224 lb (101.6 kg)     Exam:    Vital Signs:  Wt 228 lb (103.4 kg)   BMI 33.67 kg/m    Exam not performed due to telephone visit  ASSESSMENT & PLAN:    1. Hypertriglyceridemia 2. Uncontrolled DM2 3. Chronic diastolic CHF 4. Obesity  Mr. Broyles has hypertriglyceridemia which seems to track almost exactly with a significant rise in hemoglobin A1c which is trended up from just over 7 to greater than 10 back in July.  At that time he was placed on long-acting insulin in addition to his other agents and reports now his a.m. blood sugars are closer to 120.  I suspect this change along with dietary modifications and some weight loss will be demonstrated as lower triglycerides, possibly down in the  200s.  He is using an over-the-counter fish oil.  He says he has follow-up with his PCP to repeat labs next month.  I would await those results to see if further treatment is needed.  He could possibly go on fenofibrate in addition to his statin to help with his triglycerides as well.  Plan follow-up with me in about 3 to 4 months.  Thanks for the kind referral.  COVID-19 Education: The signs and symptoms of COVID-19 were discussed with the patient and how to seek care for testing (follow up with PCP or arrange E-visit).  The importance of social distancing was discussed today.  Patient Risk:   After full review of this patients clinical status, I feel that they are at least moderate risk at this time.  Time:   Today, I have spent 25 minutes with the patient with telehealth technology discussing high triglycerides, diabetes, diet control.     Medication Adjustments/Labs and Tests Ordered: Current medicines are reviewed at length with the patient today.  Concerns regarding medicines are outlined above.   Tests Ordered: No orders of the defined types were placed in this encounter.   Medication Changes: No orders of the defined types were placed in this encounter.   Disposition:  in 4 month(s)  Pixie Casino, MD, [facility], [place] Director of the Advanced Lipid Disorders &  Cardiovascular Risk Reduction Clinic Diplomate of the American Board of Clinical Lipidology Attending Cardiologist  Direct Dial: [phone]  Fax: [phone]  Website:  [email or web address]  Pixie Casino, MD  03/21/2020 8:28 AM

## 2020-03-21 NOTE — Patient Instructions (Signed)
Medication Instructions:  Your physician recommends that you continue on your current medications as directed. Please refer to the Current Medication list given to you today.  *If you need a refill on your cardiac medications before your next appointment, please call your pharmacy*   Follow-Up: At [facility], you and your health needs are our priority.  As part of our continuing mission to provide you with exceptional heart care, we have created designated Provider Care Teams.  These Care Teams include your primary Cardiologist (physician) and Advanced Practice Providers (APPs -  Physician Assistants and Nurse Practitioners) who all work together to provide you with the care you need, when you need it.  We recommend signing up for the patient portal called "MyChart".  Sign up information is provided on this After Visit Summary.  MyChart is used to connect with patients for Virtual Visits (Telemedicine).  Patients are able to view lab/test results, encounter notes, upcoming appointments, etc.  Non-urgent messages can be sent to your provider as well.   To learn more about what you can do with MyChart, go to [email or web address].    Your next appointment:   3-4 month(s)  The format for your next appointment:   In Person  Provider:   Raliegh Ip Mali Hilty, MD - lipid clinic   Other Instructions

## 2020-03-24 ENCOUNTER — Other Ambulatory Visit: Payer: Self-pay | Admitting: Nurse Practitioner

## 2020-03-25 ENCOUNTER — Encounter: Payer: Medicare Other | Admitting: Nurse Practitioner

## 2020-03-25 ENCOUNTER — Telehealth: Payer: Self-pay

## 2020-03-25 ENCOUNTER — Ambulatory Visit: Payer: Medicare Other

## 2020-03-25 NOTE — Chronic Care Management (AMB) (Signed)
Chronic Care Management Pharmacy Assistant   Name: Robert Walters  MRN: [id number] DOB: 01-23-1956  Reason for Encounter: Patient Assistance Coordination  03/25/2020- Robert Walters, CPA called patient informed that Robert Walters was approved through [facility] care patient assistance program. Patient aware medication should be at the office by next week.    PCP : Robert Brine, FNP  Allergies:  No Known Allergies  Medications: Outpatient Encounter Medications as of 03/25/2020  Medication Sig  . alfuzosin (UROXATRAL) 10 MG 24 hr tablet Take 1 tablet (10 mg total) by mouth daily.  . Ascorbic Acid (VITAMIN C) 500 MG CAPS Take 500 mg by mouth daily.   [name] aspirin EC 81 MG tablet Take 1 tablet (81 mg total) by mouth daily.  [name] atorvastatin (LIPITOR) 40 MG tablet TAKE 1 TABLET BY MOUTH AT BEDTIME  . b complex vitamins tablet Take 1 tablet by mouth daily.   . cholecalciferol (VITAMIN D3) 25 MCG (1000 UNIT) tablet Take 2 each by mouth daily.   [name] CINNAMON PO Take 1 tablet by mouth daily. 1000mg  daily  . Coenzyme Q10 (COQ10) 200 MG CAPS Take 1 tablet by mouth daily.   . empagliflozin (JARDIANCE) 25 MG TABS tablet Take 1 tablet (25 mg total) by mouth daily before breakfast.  . FLUoxetine (PROZAC) 40 MG capsule Take 1 capsule (40 mg total) by mouth every morning.  . Garlic 9935 MG CAPS Take 1 tablet by mouth daily.   [name] glucose blood test strip Use to check blood sugars daily 3 times daily  E11.65  . insulin degludec (TRESIBA FLEXTOUCH) 100 UNIT/ML FlexTouch Pen Inject 60 Units into the skin daily. (Patient taking differently: Inject 50 Units into the skin daily. )  . lamoTRIgine (LAMICTAL) 150 MG tablet Take 1 tablet (150 mg total) by mouth 2 (two) times daily.  [name] LORazepam (ATIVAN) 1 MG tablet TAKE 1 TABLET BY MOUTH THREE TIMES DAILY AS NEEDED FOR ANXIETY. GENERIC EQUIVALENT FOR ATIVAN.  [name] losartan (COZAAR) 100 MG tablet Take 1 tablet (100 mg total) by mouth daily.  . metFORMIN (GLUCOPHAGE) 1000 MG  tablet Take 1 tablet (1,000 mg total) by mouth 2 (two) times daily with a meal.  . metoprolol tartrate (LOPRESSOR) 25 MG tablet TAKE 1 TABLET BY MOUTH 2 TIMES DAILY. GENERIC EQUIVALENT FOR LOPRESSOR.  . Multiple Vitamin (MULTIVITAMIN WITH MINERALS) TABS tablet Take 1 tablet by mouth daily.  . Omega-3 Fatty Acids (FISH OIL) 1000 MG CAPS Take 1 capsule by mouth daily.  [name] omeprazole (PRILOSEC) 40 MG capsule TAKE 1 CAPSULE BY MOUTH 20 MINUTES BEFORE BREAKFAST.  [name] Semaglutide (RYBELSUS) 7 MG TABS Take 1 tablet by mouth daily.  . vitamin B-12 (CYANOCOBALAMIN) 500 MCG tablet Take 500 mcg by mouth daily.   No facility-administered encounter medications on file as of 03/25/2020.    Current Diagnosis: Patient Active Problem List   Diagnosis Date Noted  . Chronic diastolic heart failure ([place]) 03/27/2019  . Fatty liver 12/26/2018  . Elevated LFTs 12/26/2018  . History of colonic polyps 12/26/2018  . Fall 08/28/2018  . Rib pain on left side 08/28/2018  . Anxiety 03/31/2018  . Bipolar II disorder ([place]) 03/31/2018  . Insomnia 03/31/2018  . Type 2 diabetes mellitus without complication, without long-term current use of insulin ([place]) 03/24/2018  . Bile leak 09/15/2016  . Sepsis ([place]) 08/23/2016  . Cholecystitis   . Stroke ([place]) 07/25/2015  . 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 Dizziness 08/09/2014  . TIA (transient ischemic attack) 05/15/2012  .  Blurred vision 05/15/2012  . Ataxia 05/15/2012  . Diabetes mellitus type 2 with complications ([place]) 94/70/9628  . BPH (benign prostatic hyperplasia) 05/15/2012  . GERD (gastroesophageal reflux disease) 05/15/2012  . Obesity 05/15/2012  . Bipolar 1 disorder ([place]) 05/15/2012  . Essential hypertension 10/30/2010  . Cardiovascular risk factor 10/30/2010  . Pure hypercholesterolemia 12/05/2008    Follow-Up:  Patient [name], CPP aware.  Robert Walters, [place] Pharmacist Assistant [phone]

## 2020-03-26 ENCOUNTER — Encounter: Payer: Self-pay | Admitting: Nurse Practitioner

## 2020-03-26 ENCOUNTER — Ambulatory Visit (INDEPENDENT_AMBULATORY_CARE_PROVIDER_SITE_OTHER): Payer: Medicare Other

## 2020-03-26 ENCOUNTER — Other Ambulatory Visit: Payer: Self-pay

## 2020-03-26 ENCOUNTER — Ambulatory Visit (INDEPENDENT_AMBULATORY_CARE_PROVIDER_SITE_OTHER): Payer: Medicare Other | Admitting: Nurse Practitioner

## 2020-03-26 VITALS — BP 124/86 | HR 76 | Temp 97.6°F | Ht 69.0 in | Wt 228.0 lb

## 2020-03-26 DIAGNOSIS — Z0000 Encounter for general adult medical examination without abnormal findings: Secondary | ICD-10-CM | POA: Diagnosis not present

## 2020-03-26 DIAGNOSIS — E1122 Type 2 diabetes mellitus with diabetic chronic kidney disease: Secondary | ICD-10-CM | POA: Diagnosis not present

## 2020-03-26 DIAGNOSIS — Z79899 Other long term (current) drug therapy: Secondary | ICD-10-CM

## 2020-03-26 DIAGNOSIS — R351 Nocturia: Secondary | ICD-10-CM

## 2020-03-26 DIAGNOSIS — E782 Mixed hyperlipidemia: Secondary | ICD-10-CM

## 2020-03-26 DIAGNOSIS — N1831 Chronic kidney disease, stage 3a: Secondary | ICD-10-CM

## 2020-03-26 DIAGNOSIS — E119 Type 2 diabetes mellitus without complications: Secondary | ICD-10-CM | POA: Diagnosis not present

## 2020-03-26 DIAGNOSIS — I129 Hypertensive chronic kidney disease with stage 1 through stage 4 chronic kidney disease, or unspecified chronic kidney disease: Secondary | ICD-10-CM

## 2020-03-26 DIAGNOSIS — I700 Atherosclerosis of aorta: Secondary | ICD-10-CM

## 2020-03-26 DIAGNOSIS — Z23 Encounter for immunization: Secondary | ICD-10-CM

## 2020-03-26 DIAGNOSIS — I10 Essential (primary) hypertension: Secondary | ICD-10-CM

## 2020-03-26 DIAGNOSIS — E118 Type 2 diabetes mellitus with unspecified complications: Secondary | ICD-10-CM

## 2020-03-26 LAB — POCT URINALYSIS DIPSTICK
Bilirubin, UA: NEGATIVE
Blood, UA: NEGATIVE
Glucose, UA: POSITIVE — AB
Ketones, UA: NEGATIVE
Leukocytes, UA: NEGATIVE
Nitrite, UA: NEGATIVE
Protein, UA: NEGATIVE
Spec Grav, UA: 1.02 (ref 1.010–1.025)
Urobilinogen, UA: 1 E.U./dL
pH, UA: 6 (ref 5.0–8.0)

## 2020-03-26 LAB — POCT UA - MICROALBUMIN
Albumin/Creatinine Ratio, Urine, POC: 30
Creatinine, POC: 100 mg/dL
Microalbumin Ur, POC: 10 mg/L

## 2020-03-26 MED ORDER — TETANUS-DIPHTH-ACELL PERTUSSIS 5-2.5-18.5 LF-MCG/0.5 IM SUSP: 0.5000 mL | Freq: Once | INTRAMUSCULAR | 0 refills | Status: AC

## 2020-03-26 NOTE — Progress Notes (Signed)
This visit occurred during the SARS-CoV-2 public health emergency.  Safety protocols were in place, including screening questions prior to the visit, additional usage of staff PPE, and extensive cleaning of exam room while observing appropriate contact time as indicated for disinfecting solutions.  Subjective:   Robert Walters is a 64 y.o. male who presents for Medicare Annual/Subsequent preventive examination.  Review of Systems     Cardiac Risk Factors include: diabetes mellitus;hypertension;male gender;obesity (BMI >30kg/m2);sedentary lifestyle     Objective:    Today's Vitals   03/26/20 1045  BP: 124/86  Pulse: 76  Temp: 97.6 F (36.4 C)  TempSrc: Oral  Weight: 228 lb (103.4 kg)  Height: 5\' 9"  (1.753 m)   Body mass index is 33.67 kg/m.  Advanced Directives 03/26/2020 03/21/2019 12/03/2016 09/17/2016 09/01/2016 08/23/2016 08/23/2016  Does Patient Have a Medical Advance Directive? No No No No No No No  Would patient like information on creating a medical advance directive? No - Patient declined - No - Patient declined No - Patient declined No - Patient declined No - Patient declined No - Patient declined  Pre-existing out of facility DNR order (yellow form or pink MOST form) - - - - - - -    Current Medications (verified) Outpatient Encounter Medications as of 03/26/2020  Medication Sig  . alfuzosin (UROXATRAL) 10 MG 24 hr tablet Take 1 tablet (10 mg total) by mouth daily.  . Ascorbic Acid (VITAMIN C) 500 MG CAPS Take 500 mg by mouth daily.   [name] aspirin EC 81 MG tablet Take 1 tablet (81 mg total) by mouth daily.  [name] atorvastatin (LIPITOR) 40 MG tablet TAKE 1 TABLET BY MOUTH AT BEDTIME  . b complex vitamins tablet Take 1 tablet by mouth daily.   . cholecalciferol (VITAMIN D3) 25 MCG (1000 UNIT) tablet Take 2 each by mouth daily.   [name] CINNAMON PO Take 1 tablet by mouth daily. 1000mg  daily  . Coenzyme Q10 (COQ10) 200 MG CAPS Take 1 tablet by mouth daily.   . empagliflozin (JARDIANCE)  25 MG TABS tablet Take 1 tablet (25 mg total) by mouth daily before breakfast.  . FLUoxetine (PROZAC) 40 MG capsule Take 1 capsule (40 mg total) by mouth every morning.  . Garlic 8756 MG CAPS Take 1 tablet by mouth daily.   [name] glucose blood test strip Use to check blood sugars daily 3 times daily  E11.65  . insulin degludec (TRESIBA FLEXTOUCH) 100 UNIT/ML FlexTouch Pen Inject 60 Units into the skin daily. (Patient taking differently: Inject 50 Units into the skin daily. )  . lamoTRIgine (LAMICTAL) 150 MG tablet Take 1 tablet (150 mg total) by mouth 2 (two) times daily.  [name] LORazepam (ATIVAN) 1 MG tablet TAKE 1 TABLET BY MOUTH THREE TIMES DAILY AS NEEDED FOR ANXIETY. GENERIC EQUIVALENT FOR ATIVAN.  [name] losartan (COZAAR) 100 MG tablet Take 1 tablet (100 mg total) by mouth daily.  . metFORMIN (GLUCOPHAGE) 1000 MG tablet Take 1 tablet (1,000 mg total) by mouth 2 (two) times daily with a meal.  . metoprolol tartrate (LOPRESSOR) 25 MG tablet TAKE 1 TABLET BY MOUTH 2 TIMES DAILY. GENERIC EQUIVALENT FOR LOPRESSOR.  . Multiple Vitamin (MULTIVITAMIN WITH MINERALS) TABS tablet Take 1 tablet by mouth daily.  . Omega-3 Fatty Acids (FISH OIL) 1000 MG CAPS Take 1 capsule by mouth daily.  [name] omeprazole (PRILOSEC) 40 MG capsule TAKE 1 CAPSULE BY MOUTH 20 MINUTES BEFORE BREAKFAST.  [name] Semaglutide (RYBELSUS) 7 MG TABS Take 1 tablet by mouth  daily.  . vitamin B-12 (CYANOCOBALAMIN) 500 MCG tablet Take 500 mcg by mouth daily.   No facility-administered encounter medications on file as of 03/26/2020.    Allergies (verified) Patient has no known allergies.   History: Past Medical History:  Diagnosis Date  . Acid reflux disease   . Bipolar 1 disorder, mixed ([place])   . BPH (benign prostatic hypertrophy)   . CVA (cerebral vascular accident) ([place]) 07/2015  . Gastritis   . H/O hiatal hernia   . Hyperlipidemia   . Hypertension   . Normal nuclear stress test Dec 2011  . Obesity   . TIA (transient ischemic attack)  05/15/2012   "they think I've had one this am @ 0130" (05/15/2012)  . Type II diabetes mellitus ([place])   . Vertigo    Past Surgical History:  Procedure Laterality Date  . BILIARY STENT PLACEMENT N/A 09/17/2016   Procedure: BILIARY STENT PLACEMENT;  Surgeon: [name], MD;  [facility];  Service: Endoscopy;  Laterality: N/A;  . CARDIOVASCULAR STRESS TEST  2009   EF 63%  . CHOLECYSTECTOMY N/A 08/25/2016   Procedure: LAPAROSCOPIC CHOLECYSTECTOMY;  Surgeon: [name], MD;  Location: [facility];  Service: General;  Laterality: N/A;  . COLONOSCOPY    . ERCP N/A 09/17/2016   Procedure: ENDOSCOPIC RETROGRADE CHOLANGIOPANCREATOGRAPHY (ERCP);  Surgeon: [name], MD;  [facility];  Service: Endoscopy;  Laterality: N/A;  730  . ERCP N/A 12/03/2016   Procedure: ENDOSCOPIC RETROGRADE CHOLANGIOPANCREATOGRAPHY (ERCP);  Surgeon: [name], MD;  [facility];  Service: Gastroenterology;  Laterality: N/A;  . ESOPHAGOGASTRODUODENOSCOPY    . GASTROINTESTINAL STENT REMOVAL N/A 12/03/2016   Procedure: BILIARY STENT REMOVAL;  Surgeon: [name], MD;  [facility];  Service: Gastroenterology;  Laterality: N/A;  . NO PAST SURGERIES    . SPHINCTEROTOMY N/A 09/17/2016   Procedure: SPHINCTEROTOMY;  Surgeon: [name], MD;  [facility];  Service: Endoscopy;  Laterality: N/A;   Family History  Problem Relation Age of Onset  . Hyperlipidemia Father        in a nursing home  . Colon cancer Father   . Stroke Mother 22   Social History   Socioeconomic History  . Marital status: Widowed    Spouse name: Not on file  . Number of children: Not on file  . Years of education: Not on file  . Highest education level: Not on file  Occupational History  . Occupation: disabled  Tobacco Use  . Smoking status: Never Smoker  . Smokeless tobacco: Never Used  Vaping Use  . Vaping Use: Never used  Substance and Sexual Activity  . Alcohol  use: No  . Drug use: No  . Sexual activity: Not Currently  Other Topics Concern  . Not on file  Social History Narrative  . Not on file   Social Determinants of Health   Financial Resource Strain: Low Risk   . Difficulty of Paying Living Expenses: Not hard at all  Food Insecurity: No Food Insecurity  . Worried About Charity fundraiser in the Last Year: Never true  . Ran Out of Food in the Last Year: Never true  Transportation Needs: No Transportation Needs  . Lack of Transportation (Medical): No  . Lack of Transportation (Non-Medical): No  Physical Activity: Inactive  . Days of Exercise per Week: 0 days  . Minutes of Exercise per Session: 0 min  Stress: No Stress Concern  Present  . Feeling of Stress : Not at all  Social Connections:   . Frequency of Communication with Friends and Family: Not on file  . Frequency of Social Gatherings with Friends and Family: Not on file  . Attends Religious Services: Not on file  . Active Member of Clubs or Organizations: Not on file  . Attends Archivist Meetings: Not on file  . Marital Status: Not on file    Tobacco Counseling Counseling given: Not Answered   Clinical Intake:  Pre-visit preparation completed: Yes  Pain : No/denies pain     Nutritional Status: BMI > 30  Obese Nutritional Risks: None Diabetes: Yes  How often do you need to have someone help you when you read instructions, pamphlets, or other written materials from your doctor or pharmacy?: 1 - Never What is the last grade level you completed in school?: 12th grade  Diabetic? Yes Nutrition Risk Assessment:  Has the patient had any N/V/D within the last 2 months?  No  Does the patient have any non-healing wounds?  No  Has the patient had any unintentional weight loss or weight gain?  No   Diabetes:  Is the patient diabetic?  Yes  If diabetic, was a CBG obtained today?  No  Did the patient bring in their glucometer from home?  No  How often do you  monitor your CBG's? 2-3 daily.   Financial Strains and Diabetes Management:  Are you having any financial strains with the device, your supplies or your medication? No .  Does the patient want to be seen by Chronic Care Management for management of their diabetes?  No  Would the patient like to be referred to a Nutritionist or for Diabetic Management?  No   Diabetic Exams:  Diabetic Eye Exam: Completed 01/02/2020 Diabetic Foot Exam: Completed today   Interpreter Needed?: No  Information entered by :: NAllen LPN   Activities of Daily Living In your present state of health, do you have any difficulty performing the following activities: 03/26/2020 03/26/2020  Hearing? N N  Vision? N N  Difficulty concentrating or making decisions? N N  Walking or climbing stairs? N N  Dressing or bathing? N N  Doing errands, shopping? N N  Preparing Food and eating ? N -  Using the Toilet? N -  In the past six months, have you accidently leaked urine? Y -  Do you have problems with loss of bowel control? N -  Managing your Medications? N -  Managing your Finances? N -  Housekeeping or managing your Housekeeping? N -  Some recent data might be hidden    Patient Care Team: [name], FNP as PCP - General (General Practice) Nahser, Wonda Cheng, MD as PCP - Cardiology (Cardiology) Rex Kras, Claudette Stapler, RN as Case Manager Caudill, Kennieth Francois, [facility] (Inactive) (Pharmacist)  Indicate any recent Medical Services you may have received from other than [facility] providers in the past year (date may be approximate).     Assessment:   This is a routine wellness examination for Basir.  Hearing/Vision screen  Hearing Screening   125Hz  250Hz  500Hz  1000Hz  2000Hz  3000Hz  4000Hz  6000Hz  8000Hz   Right ear:           Left ear:           Vision Screening Comments: Regular eye exams, [facility]  Dietary issues and exercise activities discussed: Current Exercise Habits: The patient does not participate in regular  exercise at present  Goals    .  "I would like to improve my kidney function" (pt-stated)      CARE PLAN ENTRY (see longitudinal plan of care for additional care plan information)  Current Barriers:  [name] Knowledge Deficits related to disease process and Self Health management of CKD . Chronic Disease Management support and education needs related to DMII, Essential Hypertension, Stage 3a CKD, Mixed Hyperlipidemia   Nurse Case Manager Clinical Goal(s):  [name] Over the next 90 days, patient will work with the [place] CM and PCP to address needs related to disease education and support to improve CKD  CCM RN CM Interventions:  02/04/20 call completed with patient  . Inter-disciplinary care team collaboration (see longitudinal plan of care) . Evaluation of current treatment plan related to CKD and patient's adherence to plan as established by provider. . Provided education to patient re: kidney decline with recent GFR at 72; Educated patient on stages of kidney disease and Self Health management to help improve kidney function  . Discussed plans with patient for ongoing care management follow up and provided patient with direct contact information for care management team . Provided patient with printed educational materials related to the [place] for Kidney Patients  Patient Self Care Activities:  . Self administers medications as prescribed . Attends all scheduled provider appointments . Calls pharmacy for medication refills . Performs ADL's independently . Performs IADL's independently . Calls provider office for new concerns or questions  Please see past updates related to this goal by clicking on the "Past Updates" button in the selected goal      .  "to lower my A1c" (pt-stated)      CARE PLAN ENTRY (see longitudinal plan of care for additional care plan information)  Current Barriers:  [name] Knowledge Deficits related to disease process and Self Health management of DM   . Chronic Disease Management support and education needs related to DMII, Essential Hypertension, Stage 3a CKD, Mixed Hyperlipidemia   Nurse Case Manager Clinical Goal(s):  . 02/04/20 New Over the next 180 days, patient will work with CCM RN CM and PCP to address needs related to disease education and support to improve Self Health management of DM  CCM RN CM Interventions:  02/04/20 call completed with patient  . Inter-disciplinary care team collaboration (see longitudinal plan of care) . Evaluation of current treatment plan related to DM and patient's adherence to plan as established by provider. . Provided education to patient re: current A1c is up to 10.7 from 9.9; Re-Educated on target A1c <7.0; Re-Educated patient regarding potential complications that may occur if left uncontrolled; Re-educated on Self Health management, including dietary and exercise recommendations, monitoring CBG 2-3 times daily before meals and logging BS; RE-Educated on daily glycemic control FBS 80-130; <180 after meals; Re-Educated on importance of implementing exercise to daily routine, maintaining healthy weight and adhering to diabetic diet: Determined patient is now checking his CBG's at home 1-2 times daily and is logging his readings    . Reviewed medications with patient and discussed indication, dosage and frequency of prescribed medications for diabetes: Determined patient understands his regimen and is adhering, Patient reports having nocturia with urgency and frequency, stating, "I am urinating every 15 minutes during the nighttime since increasing the Jardiance" . Collaborated with PCP [name], FNP and embedded Pharm D Courtney Caudill via in basket message  regarding patient's c/o of nocturia with increased urgency and frequency since increasing the Jardiance . Provided patient with  printed educational materials related to [employer] with Diabetes; Preventing Complications from Diabetes; Diabetes  and Kidney Disease  . Advised patient, providing education and rationale, to check cbg 2-3 times daily before meals and record, calling the CCM RN CM and or PCP for findings outside established parameters . Discussed plans with patient for ongoing care management follow up and provided patient with direct contact information for care management team    Patient Self Care Activities:  . Self administers medications as prescribed . Attends all scheduled provider appointments . Calls pharmacy for medication refills . Performs ADL's independently . Performs IADL's independently . Calls provider office for new concerns or questions  Please see past updates related to this goal by clicking on the "Past Updates" button in the selected goal      .  "to lower my Cholesterol" (pt-stated)      [place] (see longitudinal plan of care for additional care plan information)  Current Barriers:  [name] Knowledge Deficits related to disease process and Self Health management of Mixed Hyperlipidemia  . Chronic Disease Management support and education needs related to DMII, Essential Hypertension, Stage 3a CKD, Mixed Hyperlipidemia   Nurse Case Manager Clinical Goal(s):  [name] Over the next 180 days, patient will work with the CCM team and PCP to address needs related to disease education and support to improve Self Health management of Mixed Hyperlipidemia   CCM RN CM Interventions:  02/04/20 call completed with patient  . Inter-disciplinary care team collaboration (see longitudinal plan of care) . Evaluation of current treatment plan related to Mixed Hyperlipidemia and patient's adherence to plan as established by provider. . Provided education to patient re: current lipid panel; educated on dietary and exercise recommendations; educated on disease process and potential complications if left untreated . Reviewed medications with patient and discussed patient reports adherence to taking Atorvastatin 40 mg q  hs  . Collaborated with PCP [name], FNP  regarding referral placement for [place] [facility] . Provided patient with printed educational materials related to 13 Cholesterol Lowering Foods; 12 Triglyceride Lowering Foods; 11 Foods to increase your HDL . Discussed plans with patient for ongoing care management follow up and provided patient with direct contact information for care management team  Patient Self Care Activities:  . Self administers medications as prescribed . Attends all scheduled provider appointments . Calls pharmacy for medication refills . Attends church or other social activities . Performs ADL's independently . Performs IADL's independently  Initial goal documentation     .  Patient Stated      03/21/2019, want to eat healthy    .  Patient Stated      03/26/2020, no goals    .  Pharmacy Care Plan      CARE PLAN ENTRY (see longitudinal plan of care for additional care plan information)  Current Barriers:  . Chronic Disease Management support, education, and care coordination needs related to Hypertension, Hyperlipidemia, and Diabetes   Hypertension BP Readings from Last 3 Encounters:  11/19/19 (!) 146/63  11/06/19 136/84  10/22/19 (!) 90/58   . Pharmacist Clinical Goal(s): o Over the next 90 days, patient will work with PharmD and providers to achieve BP goal <130/80 . Current regimen:  o Losartan 100mg  daily o Metoprolol tartrate 25mg  twice daily . Interventions: . Provided dietary and exercise recommendations . Patient was only taking metoprolol once daily. Advised patient to increase to 1 tablet twice daily as directed . Discussed why patient is  taking losartan . Patient self care activities - Over the next 90 days, patient will: o Check BP 2-3 times weekly, document, and provide at future appointments o Ensure daily salt intake < 2300 mg/day o Start taking metoprolol tartrate 1 tablet TWICE daily as directed o Work on exercising more  with a goal of 30 minutes daily 5 times per week (150 minutes total)  Hyperlipidemia Lab Results  Component Value Date/Time   LDLCALC 110 (H) 11/06/2019 10:53 AM   LDLDIRECT 124.3 11/10/2010 09:53 AM   . Pharmacist Clinical Goal(s): o Over the next 90 days, patient will work with PharmD and providers to achieve LDL goal < 70 . Current regimen:  o Atorvastatin 40mg  daily . Interventions: o Provided dietary and exercise recommendations . Patient self care activities - Over the next 90 days, patient will: o Work on exercising more with a goal of 30 minutes daily 5 times per week (150 minutes total)  Diabetes Lab Results  Component Value Date/Time   HGBA1C 10.7 (H) 11/06/2019 10:53 AM   HGBA1C 9.9 (H) 07/04/2019 12:33 PM   . Pharmacist Clinical Goal(s): o Over the next 90 days, patient will work with PharmD and providers to achieve A1c goal <7% . Current regimen:  o Jardiance 10mg  daily before breakfast o Tresiba 45 units daily o Metformin 1000mg  twice daily o Rybelsus 3mg  daily . Interventions: o Provided dietary and exercise recommendations - Recommend patient focus on eating healthier, well-balanced meals o Determined patient is speaking with a diabetic educator weekly to adjust insulin and discuss diet o Patient assistance programs [name] and Rybelsus 7mg  through [place] 25mg  through [employer]) - Patient provided proof of income and signed patient portion of applications - Applications provided to PCP to sign - Completed applications faxed to respective patient assistance programs o Discussed administration site for [place] and duration  o Discussed timing of Rybelsus administration . Patient self care activities - Over the next 90 days, patient will: o Check blood sugar twice daily, document, and provide at future appointments o Contact provider with any episodes of hypoglycemia o Work on exercising more with a goal of 30 minutes daily 5 times per week  (150 minutes total) o Decrease carbohydrate consumption with each meal o Reduce intake of sweet tea  Medication management . Pharmacist Clinical Goal(s): o Over the next 90 days, patient will work with PharmD and providers to achieve optimal medication adherence . Current pharmacy: [facility] . Interventions o Comprehensive medication review performed. o Continue current medication management strategy . Patient self care activities - Over the next 90 days, patient will: o Focus on medication adherence by utilizing a pill box o Take medications as prescribed o Report any questions or concerns to PharmD and/or provider(s)  Initial goal documentation       Depression Screen PHQ 2/9 Scores 03/26/2020 11/06/2019 07/04/2019 07/04/2019 03/21/2019 03/21/2019 03/21/2019  PHQ - 2 Score 0 0 0 0 1 0 0  PHQ- 9 Score 0 0 0 - 2 0 -  Exception Documentation Other- indicate reason in comment box - - - - - -  Not completed just completed by CMA - - - - - -    Fall Risk Fall Risk  03/26/2020 07/04/2019 03/21/2019 03/21/2019 01/15/2019  Falls in the past year? 1 0 1 1 0  Comment lost balance, tripped x 2 - got feet crossed up, tripped over something - -  Number falls in past yr: 1 -  1 0 -  Injury with Fall? 0 - 0 0 -  Risk for fall due to : Medication side effect;History of fall(s) - Medication side effect;History of fall(s) - -  Follow up Falls evaluation completed;Education provided;Falls prevention discussed - Falls evaluation completed;Education provided;Falls prevention discussed - -    Any stairs in or around the home? Yes  If so, are there any without handrails? No  Home free of loose throw rugs in walkways, pet beds, electrical cords, etc? Yes  Adequate lighting in your home to reduce risk of falls? Yes   ASSISTIVE DEVICES UTILIZED TO PREVENT FALLS:  Life alert? No  Use of a cane, walker or w/c? No  Grab bars in the bathroom? Yes   Shower chair or bench in shower? Yes  Elevated toilet seat or a handicapped toilet? No   TIMED UP AND GO:  Was the test performed? No .    Gait slow and steady without use of assistive device  Cognitive Function:     6CIT Screen 03/26/2020 03/21/2019  What Year? 0 points 0 points  What month? 0 points 0 points  What time? 0 points 0 points  Count back from 20 0 points 0 points  Months in reverse 0 points 0 points  Repeat phrase 2 points 8 points  Total Score 2 8    Immunizations Immunization History  Administered Date(s) Administered  . DTaP 10/05/2011  . Influenza, High Dose Seasonal PF 01/15/2019  . Influenza-Unspecified 03/03/2012, 03/16/2018, 03/21/2020  . Pneumococcal Conjugate-13 03/21/2019  . Pneumococcal Polysaccharide-23 05/16/2012  . Zoster Recombinat (Shingrix) 11/19/2019    TDAP status: Due, Education has been provided regarding the importance of this vaccine. Advised may receive this vaccine at local pharmacy or Health Dept. Aware to provide a copy of the vaccination record if obtained from local pharmacy or Health Dept. Verbalized acceptance and understanding. Flu Vaccine status: Up to date Pneumococcal vaccine status: Up to date Covid-19 vaccine status: Completed vaccines  Qualifies for Shingles Vaccine? Yes   Zostavax completed No   Shingrix Completed?: Yes  Screening Tests Health Maintenance  Topic Date Due  . COVID-19 Vaccine (1) Never done  . TETANUS/TDAP  Never done  . PNA vac Low Risk Adult (2 of 2 - PPSV23) 03/20/2020  . HEMOGLOBIN A1C  05/08/2020  . OPHTHALMOLOGY EXAM  01/01/2021  . FOOT EXAM  03/26/2021  . COLONOSCOPY  01/19/2028  . INFLUENZA VACCINE  Completed  . Hepatitis C Screening  Completed    Health Maintenance  Health Maintenance Due  Topic Date Due  . COVID-19 Vaccine (1) Never done  . TETANUS/TDAP  Never done  . PNA vac Low Risk Adult (2 of 2 - PPSV23) 03/20/2020    Colorectal cancer screening: Completed 01/18/2018.  Repeat every 3 years  Lung Cancer Screening: (Low Dose CT Chest recommended if Age 76-80 years, 30 pack-year currently smoking OR have quit w/in 15years.) does not qualify.   Lung Cancer Screening Referral: no  Additional Screening:  Hepatitis C Screening: does qualify; Completed 03/24/2018  Vision Screening: Recommended annual ophthalmology exams for early detection of glaucoma and other disorders of the eye. Is the patient up to date with their annual eye exam?  Yes  Who is the provider or what is the name of the office in which the patient attends annual eye exams? [facility] If pt is not established with a provider, would they like to be referred to a provider to establish care? No .   Dental  Screening: Recommended annual dental exams for proper oral hygiene  Community Resource Referral / Chronic Care Management: CRR required this visit?  No   CCM required this visit?  No      Plan:     I have personally reviewed and noted the following in the patient's chart:   . Medical and social history . Use of alcohol, tobacco or illicit drugs  . Current medications and supplements . Functional ability and status . Nutritional status . Physical activity . Advanced directives . List of other physicians . Hospitalizations, surgeries, and ER visits in previous 12 months . Vitals . Screenings to include cognitive, depression, and falls . Referrals and appointments  In addition, I have reviewed and discussed with patient certain preventive protocols, quality metrics, and best practice recommendations. A written personalized care plan for preventive services as well as general preventive health recommendations were provided to patient.     Kellie Simmering, LPN   [id number]   Nurse Notes:

## 2020-03-26 NOTE — Progress Notes (Addendum)
°I,Erica T Jackson,acting as a scribe for Janece Moore, FNP.,have documented all relevant documentation on the behalf of Janece Moore, FNP,as directed by  Janece Moore, FNP while in the presence of Janece Moore, FNP. °This visit occurred during the SARS-CoV-2 public health emergency.  Safety protocols were in place, including screening questions prior to the visit, additional usage of staff PPE, and extensive cleaning of exam room while observing appropriate contact time as indicated for disinfecting solutions. ° °Subjective:  °  ° Patient ID: Robert Walters , male    DOB: 04/28/1956 , 64 y.o.   MRN: [id number] ° ° °Chief Complaint  °Patient presents with  ° Annual Exam  ° ° °HPI ° °Pt here for Health Maintenance exam.  He reports he is fully vaccinated for covid Booster.   ° °Hypertension °This is a chronic problem. The current episode started more than 1 year ago. The problem is unchanged. The problem is controlled. Pertinent negatives include no anxiety, blurred vision or headaches. There are no associated agents to hypertension. Risk factors for coronary artery disease include obesity, sedentary lifestyle and male gender. Past treatments include angiotensin blockers and beta blockers. The current treatment provides no improvement. There are no compliance problems.  There is no history of angina or kidney disease. There is no history of chronic renal disease.  °Diabetes °He presents for his follow-up diabetic visit. He has type 2 diabetes mellitus. His disease course has been stable. Pertinent negatives for hypoglycemia include no confusion, dizziness or headaches. Pertinent negatives for diabetes include no blurred vision, no fatigue, no polydipsia, no polyphagia and no polyuria. There are no hypoglycemic complications. Symptoms are stable. There are no diabetic complications. Risk factors for coronary artery disease include diabetes mellitus, hypertension, male sex, obesity and sedentary lifestyle. Current  diabetic treatment includes oral agent (triple therapy) (he is taking Jardiance and Tresiba). His weight is stable. He has not had a previous visit with a dietitian. He never participates in exercise. Home blood sugar record trend: Not checking his blood sugar regularly. An ACE inhibitor/angiotensin II receptor blocker is being taken. He does not see a podiatrist.Eye exam is current (He has scheduled an appt in the next 2-3 weeks.).  °  ° °Past Medical History:  °Diagnosis Date  ° Acid reflux disease   ° Bipolar 1 disorder, mixed (HCC)   ° BPH (benign prostatic hypertrophy)   ° CVA (cerebral vascular accident) (HCC) 07/2015  ° Gastritis   ° H/O hiatal hernia   ° Hyperlipidemia   ° Hypertension   ° Normal nuclear stress test Dec 2011  ° Obesity   ° TIA (transient ischemic attack) 05/15/2012  ° "they think I've had one this am @ 0130" (05/15/2012)  ° Type II diabetes mellitus (HCC)   ° Vertigo   °  ° °Family History  °Problem Relation Age of Onset  ° Hyperlipidemia Father   °     in a nursing home  ° Colon cancer Father   ° Stroke Mother 83  ° ° ° °Current Outpatient Medications:  °  alfuzosin (UROXATRAL) 10 MG 24 hr tablet, Take 1 tablet (10 mg total) by mouth daily., Disp: 30 tablet, Rfl: 2 °  Ascorbic Acid (VITAMIN C) 500 MG CAPS, Take 500 mg by mouth daily. , Disp: , Rfl:  °  aspirin EC 81 MG tablet, Take 1 tablet (81 mg total) by mouth daily., Disp: , Rfl:  °  atorvastatin (LIPITOR) 40 MG tablet, TAKE 1 TABLET BY MOUTH AT   AT BEDTIME, Disp: 90 tablet, Rfl: 1   b complex vitamins tablet, Take 1 tablet by mouth daily. , Disp: , Rfl:    cholecalciferol (VITAMIN D3) 25 MCG (1000 UNIT) tablet, Take 2 each by mouth daily. , Disp: , Rfl:    CINNAMON PO, Take 1 tablet by mouth daily. 1035m daily, Disp: , Rfl:    Coenzyme Q10 (COQ10) 200 MG CAPS, Take 1 tablet by mouth daily. , Disp: , Rfl:    empagliflozin (JARDIANCE) 25 MG TABS tablet, Take 1 tablet (25 mg total) by mouth daily before breakfast., Disp: 30 tablet, Rfl:  2   FLUoxetine (PROZAC) 40 MG capsule, Take 1 capsule (40 mg total) by mouth every morning., Disp: 90 capsule, Rfl: 1   Garlic 18811MG CAPS, Take 1 tablet by mouth daily. , Disp: , Rfl:    glucose blood test strip, Use to check blood sugars daily 3 times daily  E11.65, Disp: 200 each, Rfl: 3   insulin degludec (TRESIBA FLEXTOUCH) 100 UNIT/ML FlexTouch Pen, Inject 60 Units into the skin daily. (Patient taking differently: Inject 50 Units into the skin daily. ), Disp: 15 mL, Rfl: 2   lamoTRIgine (LAMICTAL) 150 MG tablet, Take 1 tablet (150 mg total) by mouth 2 (two) times daily., Disp: 180 tablet, Rfl: 1   LORazepam (ATIVAN) 1 MG tablet, TAKE 1 TABLET BY MOUTH THREE TIMES DAILY AS NEEDED FOR ANXIETY. GENERIC EQUIVALENT FOR ATIVAN., Disp: 270 tablet, Rfl: 1   losartan (COZAAR) 100 MG tablet, Take 1 tablet (100 mg total) by mouth daily., Disp: 90 tablet, Rfl: 3   metFORMIN (GLUCOPHAGE) 1000 MG tablet, Take 1 tablet (1,000 mg total) by mouth 2 (two) times daily with a meal., Disp: 180 tablet, Rfl: 1   metoprolol tartrate (LOPRESSOR) 25 MG tablet, TAKE 1 TABLET BY MOUTH 2 TIMES DAILY. GENERIC EQUIVALENT FOR LOPRESSOR., Disp: 180 tablet, Rfl: 2   Multiple Vitamin (MULTIVITAMIN WITH MINERALS) TABS tablet, Take 1 tablet by mouth daily., Disp: , Rfl:    Omega-3 Fatty Acids (FISH OIL) 1000 MG CAPS, Take 1 capsule by mouth daily., Disp: , Rfl:    omeprazole (PRILOSEC) 40 MG capsule, TAKE 1 CAPSULE BY MOUTH 20 MINUTES BEFORE BREAKFAST., Disp: 90 capsule, Rfl: 0   Semaglutide (RYBELSUS) 7 MG TABS, Take 1 tablet by mouth daily., Disp: 30 tablet, Rfl: 2   vitamin B-12 (CYANOCOBALAMIN) 500 MCG tablet, Take 500 mcg by mouth daily., Disp: , Rfl:    No Known Allergies   Men's preventive visit. Patient Health Questionnaire (PHQ-2) is    Office Visit from 03/26/2020 in [facility]  PHQ-2 Total Score 0      Patient is on a healthier diet, he is not eating as much junk, he is not eating as  many sweets.  Marital status: Widowed. Relevant history for alcohol use is:  Social History   Substance and Sexual Activity  Alcohol Use No   Relevant history for tobacco use is:  Social History   Tobacco Use  Smoking Status Never Smoker  Smokeless Tobacco Never Used    Review of Systems  Constitutional: Negative.  Negative for fatigue.  HENT: Negative.   Eyes: Negative.  Negative for blurred vision.  Respiratory: Negative.   Cardiovascular: Negative.   Gastrointestinal: Negative.   Endocrine: Negative.  Negative for polydipsia, polyphagia and polyuria.  Genitourinary: Negative.   Musculoskeletal: Negative.   Skin: Negative.   Allergic/Immunologic: Negative.   Neurological: Negative.  Negative for dizziness and headaches.  Hematological:  Negative.   Psychiatric/Behavioral: Negative.  Negative for confusion.     Today's Vitals   03/26/20 0958  BP: 124/86  Pulse: 76  Temp: 97.6 F (36.4 C)  TempSrc: Oral  Weight: 228 lb (103.4 kg)  Height: 5' 9" (1.753 m)  PainSc: 0-No pain   Body mass index is 33.67 kg/m.   Objective:  Physical Exam Vitals reviewed.  Constitutional:      Appearance: Normal appearance. He is obese.  HENT:     Head: Normocephalic and atraumatic.     Right Ear: Tympanic membrane, ear canal and external ear normal. There is no impacted cerumen.     Left Ear: Tympanic membrane, ear canal and external ear normal. There is no impacted cerumen.     Nose:     Comments: Deferred - masked    Mouth/Throat:     Comments: Deferred - masked Eyes:     Pupils: Pupils are equal, round, and reactive to light.  Cardiovascular:     Rate and Rhythm: Normal rate and regular rhythm.     Pulses: Normal pulses.     Heart sounds: Normal heart sounds. No murmur heard.   Pulmonary:     Effort: Pulmonary effort is normal. No respiratory distress.     Breath sounds: Normal breath sounds.  Abdominal:     General: Abdomen is flat. Bowel sounds are normal. There is  no distension.     Palpations: Abdomen is soft.     Comments: Rounded abdomen  Genitourinary:    Prostate: Normal.     Rectum: Guaiac result negative.  Musculoskeletal:        General: No swelling or tenderness. Normal range of motion.     Cervical back: Normal range of motion and neck supple.  Skin:    General: Skin is warm.     Capillary Refill: Capillary refill takes less than 2 seconds.  Neurological:     General: No focal deficit present.     Mental Status: He is alert and oriented to person, place, and time.  Psychiatric:        Mood and Affect: Mood normal.        Behavior: Behavior normal.        Thought Content: Thought content normal.        Judgment: Judgment normal.         Assessment And Plan:    1. Encounter for health maintenance examination Behavior modifications discussed and diet history reviewed.   Pt will continue to exercise regularly and modify diet with low GI, plant based foods and decrease intake of processed foods.  Recommend intake of daily multivitamin, Vitamin D, and calcium.  Recommend for preventive screenings, as well as recommend immunizations that include influenza, TDAP (sent to pharmacy)  2. Type 2 diabetes mellitus with unspecified complications ([place]) He is tolerating rybelsus and jardiance well He is on [place] for a short period and doing well the goal is to get him off the insulin Will check HgbA1c Congratulated on eating a more healthy diet Diabetic foot exam done   3. Benign hypertensive renal disease Chronic, blood pressure is well controlled Continue with current medications EKG done no changes - POCT Urinalysis Dipstick (81002) - POCT UA - Microalbumin - EKG 12-Lead - CMP14+EGFR  4. Mixed hyperlipidemia Chronic, controlled Continue with current medications - CMP14+EGFR - Lipid panel  6. Other long term (current) drug therapy - CBC  7. Encounter for immunization Rx sent to pharmacy TDAP will be administered  to  adults 40-59 years old every 10 years. - Tdap (BOOSTRIX) 5-2.5-18.5 LF-MCG/0.5 injection; Inject 0.5 mLs into the muscle once for 1 dose.  Dispense: 0.5 mL; Refill: 0  8. Nocturia Will check PSA  - PSA  Wt Readings from Last 3 Encounters:  03/26/20 228 lb (103.4 kg)  03/26/20 228 lb (103.4 kg)  03/21/20 228 lb (103.4 kg)     Patient was given opportunity to ask questions. Patient verbalized understanding of the plan and was able to repeat key elements of the plan. All questions were answered to their satisfaction.     Teola Bradley, FNP, have reviewed all documentation for this visit. The documentation on 04/01/20 for the exam, diagnosis, procedures, and orders are all accurate and complete.  THE PATIENT IS ENCOURAGED TO PRACTICE SOCIAL DISTANCING DUE TO THE COVID-19 PANDEMIC.

## 2020-03-26 NOTE — Patient Instructions (Signed)
[name] , Thank you for taking time to come for your Medicare Wellness Visit. I appreciate your ongoing commitment to your health goals. Please review the following plan we discussed and let me know if I can assist you in the future.   Screening recommendations/referrals: Colonoscopy: completed 01/18/2018, due 01/18/2021 Recommended yearly ophthalmology/optometry visit for glaucoma screening and checkup Recommended yearly dental visit for hygiene and checkup  Vaccinations: Influenza vaccine: completed 03/21/2020, due 12/01/2020 Pneumococcal vaccine: completed 03/21/2019 Tdap vaccine: due Shingles vaccine: completed   Covid-19:  Will call with dates  Advanced directives: Advance directive discussed with you today. Even though you declined this today please call our office should you change your mind and we can give you the proper paperwork for you to fill out.  Conditions/risks identified: none  Next appointment: 06/30/2020 at 11:15 Follow up in one year for your annual wellness visit.   Preventive Care 17 Years and Older, Male Preventive care refers to lifestyle choices and visits with your health care provider that can promote health and wellness. What does preventive care include?  A yearly physical exam. This is also called an annual well check.  Dental exams once or twice a year.  Routine eye exams. Ask your health care provider how often you should have your eyes checked.  Personal lifestyle choices, including:  Daily care of your teeth and gums.  Regular physical activity.  Eating a healthy diet.  Avoiding tobacco and drug use.  Limiting alcohol use.  Practicing safe sex.  Taking low doses of aspirin every day.  Taking vitamin and mineral supplements as recommended by your health care provider. What happens during an annual well check? The services and screenings done by your health care provider during your annual well check will depend on your age, overall  health, lifestyle risk factors, and family history of disease. Counseling  Your health care provider may ask you questions about your:  Alcohol use.  Tobacco use.  Drug use.  Emotional well-being.  Home and relationship well-being.  Sexual activity.  Eating habits.  History of falls.  Memory and ability to understand (cognition).  Work and work Statistician. Screening  You may have the following tests or measurements:  Height, weight, and BMI.  Blood pressure.  Lipid and cholesterol levels. These may be checked every 5 years, or more frequently if you are over 64 years old.  Skin check.  Lung cancer screening. You may have this screening every year starting at age 46 if you have a 30-pack-year history of smoking and currently smoke or have quit within the past 15 years.  Fecal occult blood test (FOBT) of the stool. You may have this test every year starting at age 41.  Flexible sigmoidoscopy or colonoscopy. You may have a sigmoidoscopy every 5 years or a colonoscopy every 10 years starting at age 82.  Prostate cancer screening. Recommendations will vary depending on your family history and other risks.  Hepatitis C blood test.  Hepatitis B blood test.  Sexually transmitted disease (STD) testing.  Diabetes screening. This is done by checking your blood sugar (glucose) after you have not eaten for a while (fasting). You may have this done every 1-3 years.  Abdominal aortic aneurysm (AAA) screening. You may need this if you are a current or former smoker.  Osteoporosis. You may be screened starting at age 33 if you are at high risk. Talk with your health care provider about your test results, treatment options, and if necessary, the need  for more tests. Vaccines  Your health care provider may recommend certain vaccines, such as:  Influenza vaccine. This is recommended every year.  Tetanus, diphtheria, and acellular pertussis (Tdap, Td) vaccine. You may need a Td  booster every 10 years.  Zoster vaccine. You may need this after age 8.  Pneumococcal 13-valent conjugate (PCV13) vaccine. One dose is recommended after age 62.  Pneumococcal polysaccharide (PPSV23) vaccine. One dose is recommended after age 70. Talk to your health care provider about which screenings and vaccines you need and how often you need them. This information is not intended to replace advice given to you by your health care provider. Make sure you discuss any questions you have with your health care provider. Document Released: 05/16/2015 Document Revised: 01/07/2016 Document Reviewed: 02/18/2015 Elsevier Interactive Patient Education  2017 [employer] Prevention in the Home Falls can cause injuries. They can happen to people of all ages. There are many things you can do to make your home safe and to help prevent falls. What can I do on the outside of my home?  Regularly fix the edges of walkways and driveways and fix any cracks.  Remove anything that might make you trip as you walk through a door, such as a raised step or threshold.  Trim any bushes or trees on the path to your home.  Use bright outdoor lighting.  Clear any walking paths of anything that might make someone trip, such as rocks or tools.  Regularly check to see if handrails are loose or broken. Make sure that both sides of any steps have handrails.  Any raised decks and porches should have guardrails on the edges.  Have any leaves, snow, or ice cleared regularly.  Use sand or salt on walking paths during winter.  Clean up any spills in your garage right away. This includes oil or grease spills. What can I do in the bathroom?  Use night lights.  Install grab bars by the toilet and in the tub and shower. Do not use towel bars as grab bars.  Use non-skid mats or decals in the tub or shower.  If you need to sit down in the shower, use a plastic, non-slip stool.  Keep the floor dry. Clean up  any water that spills on the floor as soon as it happens.  Remove soap buildup in the tub or shower regularly.  Attach bath mats securely with double-sided non-slip rug tape.  Do not have throw rugs and other things on the floor that can make you trip. What can I do in the bedroom?  Use night lights.  Make sure that you have a light by your bed that is easy to reach.  Do not use any sheets or blankets that are too big for your bed. They should not hang down onto the floor.  Have a firm chair that has side arms. You can use this for support while you get dressed.  Do not have throw rugs and other things on the floor that can make you trip. What can I do in the kitchen?  Clean up any spills right away.  Avoid walking on wet floors.  Keep items that you use a lot in easy-to-reach places.  If you need to reach something above you, use a strong step stool that has a grab bar.  Keep electrical cords out of the way.  Do not use floor polish or wax that makes floors slippery. If you must use wax, use  non-skid floor wax.  Do not have throw rugs and other things on the floor that can make you trip. What can I do with my stairs?  Do not leave any items on the stairs.  Make sure that there are handrails on both sides of the stairs and use them. Fix handrails that are broken or loose. Make sure that handrails are as long as the stairways.  Check any carpeting to make sure that it is firmly attached to the stairs. Fix any carpet that is loose or worn.  Avoid having throw rugs at the top or bottom of the stairs. If you do have throw rugs, attach them to the floor with carpet tape.  Make sure that you have a light switch at the top of the stairs and the bottom of the stairs. If you do not have them, ask someone to add them for you. What else can I do to help prevent falls?  Wear shoes that:  Do not have high heels.  Have rubber bottoms.  Are comfortable and fit you well.  Are  closed at the toe. Do not wear sandals.  If you use a stepladder:  Make sure that it is fully opened. Do not climb a closed stepladder.  Make sure that both sides of the stepladder are locked into place.  Ask someone to hold it for you, if possible.  Clearly mark and make sure that you can see:  Any grab bars or handrails.  First and last steps.  Where the edge of each step is.  Use tools that help you move around (mobility aids) if they are needed. These include:  Canes.  Walkers.  Scooters.  Crutches.  Turn on the lights when you go into a dark area. Replace any light bulbs as soon as they burn out.  Set up your furniture so you have a clear path. Avoid moving your furniture around.  If any of your floors are uneven, fix them.  If there are any pets around you, be aware of where they are.  Review your medicines with your doctor. Some medicines can make you feel dizzy. This can increase your chance of falling. Ask your doctor what other things that you can do to help prevent falls. This information is not intended to replace advice given to you by your health care provider. Make sure you discuss any questions you have with your health care provider. Document Released: 02/13/2009 Document Revised: 09/25/2015 Document Reviewed: 05/24/2014 Elsevier Interactive Patient Education  2017 [employer].

## 2020-03-26 NOTE — Patient Instructions (Signed)
Diabetes Mellitus and Exercise Exercising regularly is important for your overall health, especially when you have diabetes (diabetes mellitus). Exercising is not only about losing weight. It has many other health benefits, such as increasing muscle strength and bone density and reducing body fat and stress. This leads to improved fitness, flexibility, and endurance, all of which result in better overall health. Exercise has additional benefits for people with diabetes, including:  Reducing appetite.  Helping to lower and control blood glucose.  Lowering blood pressure.  Helping to control amounts of fatty substances (lipids) in the blood, such as cholesterol and triglycerides.  Helping the body to respond better to insulin (improving insulin sensitivity).  Reducing how much insulin the body needs.  Decreasing the risk for heart disease by: ? Lowering cholesterol and triglyceride levels. ? Increasing the levels of good cholesterol. ? Lowering blood glucose levels. What is my activity plan? Your health care provider or certified diabetes educator can help you make a plan for the type and frequency of exercise (activity plan) that works for you. Make sure that you:  Do at least 150 minutes of moderate-intensity or vigorous-intensity exercise each week. This could be brisk walking, biking, or water aerobics. ? Do stretching and strength exercises, such as yoga or weightlifting, at least 2 times a week. ? Spread out your activity over at least 3 days of the week.  Get some form of physical activity every day. ? Do not go more than 2 days in a row without some kind of physical activity. ? Avoid being inactive for more than 30 minutes at a time. Take frequent breaks to walk or stretch.  Choose a type of exercise or activity that you enjoy, and set realistic goals.  Start slowly, and gradually increase the intensity of your exercise over time. What do I need to know about managing my  diabetes?   Check your blood glucose before and after exercising. ? If your blood glucose is 240 mg/dL (13.3 mmol/L) or higher before you exercise, check your urine for ketones. If you have ketones in your urine, do not exercise until your blood glucose returns to normal. ? If your blood glucose is 100 mg/dL (5.6 mmol/L) or lower, eat a snack containing 15-20 grams of carbohydrate. Check your blood glucose 15 minutes after the snack to make sure that your level is above 100 mg/dL (5.6 mmol/L) before you start your exercise.  Know the symptoms of low blood glucose (hypoglycemia) and how to treat it. Your risk for hypoglycemia increases during and after exercise. Common symptoms of hypoglycemia can include: ? Hunger. ? Anxiety. ? Sweating and feeling clammy. ? Confusion. ? Dizziness or feeling light-headed. ? Increased heart rate or palpitations. ? Blurry vision. ? Tingling or numbness around the mouth, lips, or tongue. ? Tremors or shakes. ? Irritability.  Keep a rapid-acting carbohydrate snack available before, during, and after exercise to help prevent or treat hypoglycemia.  Avoid injecting insulin into areas of the body that are going to be exercised. For example, avoid injecting insulin into: ? The arms, when playing tennis. ? The legs, when jogging.  Keep records of your exercise habits. Doing this can help you and your health care provider adjust your diabetes management plan as needed. Write down: ? Food that you eat before and after you exercise. ? Blood glucose levels before and after you exercise. ? The type and amount of exercise you have done. ? When your insulin is expected to peak, if you use   insulin. Avoid exercising at times when your insulin is peaking.  When you start a new exercise or activity, work with your health care provider to make sure the activity is safe for you, and to adjust your insulin, medicines, or food intake as needed.  Drink plenty of water while  you exercise to prevent dehydration or heat stroke. Drink enough fluid to keep your urine clear or pale yellow. Summary  Exercising regularly is important for your overall health, especially when you have diabetes (diabetes mellitus).  Exercising has many health benefits, such as increasing muscle strength and bone density and reducing body fat and stress.  Your health care provider or certified diabetes educator can help you make a plan for the type and frequency of exercise (activity plan) that works for you.  When you start a new exercise or activity, work with your health care provider to make sure the activity is safe for you, and to adjust your insulin, medicines, or food intake as needed. This information is not intended to replace advice given to you by your health care provider. Make sure you discuss any questions you have with your health care provider. Document Revised: 11/11/2016 Document Reviewed: 09/29/2015 Elsevier Patient Education  2020 Elsevier Inc. Hypertension, Adult Hypertension is another name for high blood pressure. High blood pressure forces your heart to work harder to pump blood. This can cause problems over time. There are two numbers in a blood pressure reading. There is a top number (systolic) over a bottom number (diastolic). It is best to have a blood pressure that is below 120/80. Healthy choices can help lower your blood pressure, or you may need medicine to help lower it. What are the causes? The cause of this condition is not known. Some conditions may be related to high blood pressure. What increases the risk?  Smoking.  Having type 2 diabetes mellitus, high cholesterol, or both.  Not getting enough exercise or physical activity.  Being overweight.  Having too much fat, sugar, calories, or salt (sodium) in your diet.  Drinking too much alcohol.  Having long-term (chronic) kidney disease.  Having a family history of high blood pressure.  Age.  Risk increases with age.  Race. You may be at higher risk if you are African American.  Gender. Men are at higher risk than women before age 45. After age 65, women are at higher risk than men.  Having obstructive sleep apnea.  Stress. What are the signs or symptoms?  High blood pressure may not cause symptoms. Very high blood pressure (hypertensive crisis) may cause: ? Headache. ? Feelings of worry or nervousness (anxiety). ? Shortness of breath. ? Nosebleed. ? A feeling of being sick to your stomach (nausea). ? Throwing up (vomiting). ? Changes in how you see. ? Very bad chest pain. ? Seizures. How is this treated?  This condition is treated by making healthy lifestyle changes, such as: ? Eating healthy foods. ? Exercising more. ? Drinking less alcohol.  Your health care provider may prescribe medicine if lifestyle changes are not enough to get your blood pressure under control, and if: ? Your top number is above 130. ? Your bottom number is above 80.  Your personal target blood pressure may vary. Follow these instructions at home: Eating and drinking   If told, follow the DASH eating plan. To follow this plan: ? Fill one half of your plate at each meal with fruits and vegetables. ? Fill one fourth of your plate at each   meal with whole grains. Whole grains include whole-wheat pasta, brown rice, and whole-grain bread. ? Eat or drink low-fat dairy products, such as skim milk or low-fat yogurt. ? Fill one fourth of your plate at each meal with low-fat (lean) proteins. Low-fat proteins include fish, chicken without skin, eggs, beans, and tofu. ? Avoid fatty meat, cured and processed meat, or chicken with skin. ? Avoid pre-made or processed food.  Eat less than 1,500 mg of salt each day.  Do not drink alcohol if: ? Your doctor tells you not to drink. ? You are pregnant, may be pregnant, or are planning to become pregnant.  If you drink alcohol: ? Limit how much you  use to:  0-1 drink a day for women.  0-2 drinks a day for men. ? Be aware of how much alcohol is in your drink. In the [place] one drink equals one 12 oz bottle of beer (355 mL), one 5 oz glass of wine (148 mL), or one 1 oz glass of hard liquor (44 mL). Lifestyle   Work with your doctor to stay at a healthy weight or to lose weight. Ask your doctor what the best weight is for you.  Get at least 30 minutes of exercise most days of the week. This may include walking, swimming, or biking.  Get at least 30 minutes of exercise that strengthens your muscles (resistance exercise) at least 3 days a week. This may include lifting weights or doing Pilates.  Do not use any products that contain nicotine or tobacco, such as cigarettes, e-cigarettes, and chewing tobacco. If you need help quitting, ask your doctor.  Check your blood pressure at home as told by your doctor.  Keep all follow-up visits as told by your doctor. This is important. Medicines  Take over-the-counter and prescription medicines only as told by your doctor. Follow directions carefully.  Do not skip doses of blood pressure medicine. The medicine does not work as well if you skip doses. Skipping doses also puts you at risk for problems.  Ask your doctor about side effects or reactions to medicines that you should watch for. Contact a doctor if you:  Think you are having a reaction to the medicine you are taking.  Have headaches that keep coming back (recurring).  Feel dizzy.  Have swelling in your ankles.  Have trouble with your vision. Get help right away if you:  Get a very bad headache.  Start to feel mixed up (confused).  Feel weak or numb.  Feel faint.  Have very bad pain in your: ? Chest. ? Belly (abdomen).  Throw up more than once.  Have trouble breathing. Summary  Hypertension is another name for high blood pressure.  High blood pressure forces your heart to work harder to pump blood.  For  most people, a normal blood pressure is less than 120/80.  Making healthy choices can help lower blood pressure. If your blood pressure does not get lower with healthy choices, you may need to take medicine. This information is not intended to replace advice given to you by your health care provider. Make sure you discuss any questions you have with your health care provider. Document Revised: 12/28/2017 Document Reviewed: 12/28/2017 Elsevier Patient Education  2020 Elsevier Inc.  

## 2020-03-27 LAB — HEMOGLOBIN A1C
Est. average glucose Bld gHb Est-mCnc: 163 mg/dL
Hgb A1c MFr Bld: 7.3 % — ABNORMAL HIGH (ref 4.8–5.6)

## 2020-03-27 LAB — CMP14+EGFR
ALT: 66 IU/L — ABNORMAL HIGH (ref 0–44)
AST: 50 IU/L — ABNORMAL HIGH (ref 0–40)
Albumin/Globulin Ratio: 1.8 (ref 1.2–2.2)
Albumin: 4.4 g/dL (ref 3.8–4.8)
Alkaline Phosphatase: 191 IU/L — ABNORMAL HIGH (ref 44–121)
BUN/Creatinine Ratio: 15 (ref 10–24)
BUN: 22 mg/dL (ref 8–27)
Bilirubin Total: 0.6 mg/dL (ref 0.0–1.2)
CO2: 22 mmol/L (ref 20–29)
Calcium: 10 mg/dL (ref 8.6–10.2)
Chloride: 101 mmol/L (ref 96–106)
Creatinine, Ser: 1.45 mg/dL — ABNORMAL HIGH (ref 0.76–1.27)
GFR calc Af Amer: 58 mL/min/{1.73_m2} — ABNORMAL LOW (ref >59)
GFR calc non Af Amer: 50 mL/min/{1.73_m2} — ABNORMAL LOW (ref >59)
Globulin, Total: 2.4 g/dL (ref 1.5–4.5)
Glucose: 140 mg/dL — ABNORMAL HIGH (ref 65–99)
Potassium: 4.9 mmol/L (ref 3.5–5.2)
Sodium: 139 mmol/L (ref 134–144)
Total Protein: 6.8 g/dL (ref 6.0–8.5)

## 2020-03-27 LAB — LIPID PANEL
Chol/HDL Ratio: 4.4 ratio (ref 0.0–5.0)
Cholesterol, Total: 174 mg/dL (ref 100–199)
HDL: 40 mg/dL (ref >39)
LDL Chol Calc (NIH): 98 mg/dL (ref 0–99)
Triglycerides: 213 mg/dL — ABNORMAL HIGH (ref 0–149)
VLDL Cholesterol Cal: 36 mg/dL (ref 5–40)

## 2020-03-27 LAB — CBC
Hematocrit: 40.2 % (ref 37.5–51.0)
Hemoglobin: 13.5 g/dL (ref 13.0–17.7)
MCH: 30.1 pg (ref 26.6–33.0)
MCHC: 33.6 g/dL (ref 31.5–35.7)
MCV: 90 fL (ref 79–97)
Platelets: 178 10*3/uL (ref 150–450)
RBC: 4.49 x10E6/uL (ref 4.14–5.80)
RDW: 14.5 % (ref 11.6–15.4)
WBC: 6.9 10*3/uL (ref 3.4–10.8)

## 2020-03-27 LAB — PSA: Prostate Specific Ag, Serum: 0.6 ng/mL (ref 0.0–4.0)

## 2020-03-31 ENCOUNTER — Telehealth: Payer: Medicare Other

## 2020-04-01 ENCOUNTER — Encounter: Payer: Self-pay | Admitting: Nurse Practitioner

## 2020-04-04 ENCOUNTER — Other Ambulatory Visit: Payer: Self-pay | Admitting: *Deleted

## 2020-04-04 MED ORDER — METOPROLOL TARTRATE 25 MG PO TABS
ORAL_TABLET | ORAL | 3 refills | Status: DC
Start: 2020-04-04 — End: 2021-05-12

## 2020-04-07 DIAGNOSIS — I739 Peripheral vascular disease, unspecified: Secondary | ICD-10-CM | POA: Diagnosis not present

## 2020-04-22 ENCOUNTER — Telehealth: Payer: Self-pay

## 2020-04-22 NOTE — Telephone Encounter (Signed)
Robert Walters nurse practitioner from [facility] called stating they seen patient and were going to get an A1C on him but he said that we recently did an A1C on him so she was called to confirm and get the result from [place] [phone]  I returned her call and advised her that we did do an A1C on 11.24.21. YL,RMA

## 2020-04-23 ENCOUNTER — Telehealth: Payer: Self-pay

## 2020-04-23 NOTE — Chronic Care Management (AMB) (Signed)
Chronic Care Management Pharmacy Assistant   Name: Robert Walters  MRN: [id number] DOB: 03-25-1956  Reason for Encounter: Medication Review  .  PCP : Robert Brine, FNP  Allergies:  No Known Allergies  Medications: Outpatient Encounter Medications as of 04/23/2020  Medication Sig  . alfuzosin (UROXATRAL) 10 MG 24 hr tablet Take 1 tablet (10 mg total) by mouth daily.  . Ascorbic Acid (VITAMIN C) 500 MG CAPS Take 500 mg by mouth daily.   [name] aspirin EC 81 MG tablet Take 1 tablet (81 mg total) by mouth daily.  [name] atorvastatin (LIPITOR) 40 MG tablet TAKE 1 TABLET BY MOUTH AT BEDTIME  . b complex vitamins tablet Take 1 tablet by mouth daily.   . cholecalciferol (VITAMIN D3) 25 MCG (1000 UNIT) tablet Take 2 each by mouth daily.   [name] CINNAMON PO Take 1 tablet by mouth daily. 1000mg  daily  . Coenzyme Q10 (COQ10) 200 MG CAPS Take 1 tablet by mouth daily.   . empagliflozin (JARDIANCE) 25 MG TABS tablet Take 1 tablet (25 mg total) by mouth daily before breakfast.  . FLUoxetine (PROZAC) 40 MG capsule Take 1 capsule (40 mg total) by mouth every morning.  . Garlic 1194 MG CAPS Take 1 tablet by mouth daily.   [name] glucose blood test strip Use to check blood sugars daily 3 times daily  E11.65  . insulin degludec (TRESIBA FLEXTOUCH) 100 UNIT/ML FlexTouch Pen Inject 60 Units into the skin daily. (Patient taking differently: Inject 50 Units into the skin daily. )  . lamoTRIgine (LAMICTAL) 150 MG tablet Take 1 tablet (150 mg total) by mouth 2 (two) times daily.  [name] LORazepam (ATIVAN) 1 MG tablet TAKE 1 TABLET BY MOUTH THREE TIMES DAILY AS NEEDED FOR ANXIETY. GENERIC EQUIVALENT FOR ATIVAN.  [name] losartan (COZAAR) 100 MG tablet Take 1 tablet (100 mg total) by mouth daily.  . metFORMIN (GLUCOPHAGE) 1000 MG tablet Take 1 tablet (1,000 mg total) by mouth 2 (two) times daily with a meal.  . metoprolol tartrate (LOPRESSOR) 25 MG tablet TAKE 1 TABLET BY MOUTH 2 TIMES DAILY. GENERIC EQUIVALENT FOR LOPRESSOR.  .  Multiple Vitamin (MULTIVITAMIN WITH MINERALS) TABS tablet Take 1 tablet by mouth daily.  . Omega-3 Fatty Acids (FISH OIL) 1000 MG CAPS Take 1 capsule by mouth daily.  [name] omeprazole (PRILOSEC) 40 MG capsule TAKE 1 CAPSULE BY MOUTH 20 MINUTES BEFORE BREAKFAST.  [name] Semaglutide (RYBELSUS) 7 MG TABS Take 1 tablet by mouth daily.  . vitamin B-12 (CYANOCOBALAMIN) 500 MCG tablet Take 500 mcg by mouth daily.   No facility-administered encounter medications on file as of 04/23/2020.    Current Diagnosis: Patient Active Problem List   Diagnosis Date Noted  . Chronic diastolic heart failure ([place]) 03/27/2019  . Fatty liver 12/26/2018  . Elevated LFTs 12/26/2018  . History of colonic polyps 12/26/2018  . Fall 08/28/2018  . Rib pain on left side 08/28/2018  . Anxiety 03/31/2018  . Bipolar II disorder ([place]) 03/31/2018  . Insomnia 03/31/2018  . Type 2 diabetes mellitus without complication, without long-term current use of insulin ([place]) 03/24/2018  . Bile leak 09/15/2016  . Sepsis ([place]) 08/23/2016  . Cholecystitis   . Stroke ([place]) 07/25/2015  . 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 Dizziness 08/09/2014  . TIA (transient ischemic attack) 05/15/2012  . Blurred vision 05/15/2012  . Ataxia 05/15/2012  . Diabetes mellitus type 2 with complications ([place]) 17/40/8144  . BPH (benign prostatic hyperplasia) 05/15/2012  . GERD (gastroesophageal  reflux disease) 05/15/2012  . Obesity 05/15/2012  . Bipolar 1 disorder ([place]) 05/15/2012  . Essential hypertension 10/30/2010  . Cardiovascular risk factor 10/30/2010  . Pure hypercholesterolemia 12/05/2008      Follow-Up:  Pharmacist Review - New patient assistance form filled out to [facility] . [name] . Waiting for provider and patient's signature.  Called patient to have him go by PCP office with proof of income and sign application. Per patient he will go by office on [id number] and sign application and bring proof of income.  Robert  Walters,CPP notified  Robert Walters, [place] Pharmacist Assistant [phone]

## 2020-05-06 ENCOUNTER — Telehealth: Payer: Self-pay

## 2020-05-06 ENCOUNTER — Other Ambulatory Visit: Payer: Self-pay

## 2020-05-06 MED ORDER — PEN NEEDLES 32G X 4 MM MISC: 0 refills | Status: DC

## 2020-05-06 NOTE — Chronic Care Management (AMB) (Signed)
Chronic Care Management Pharmacy Assistant   Name: Robert Walters  MRN: [id number] DOB: 1955-09-21  Reason for Encounter: Patient Assistance Coordination  PCP : Robert Felts, FNP   05/06/2020 - Patient reached out to Robert Walters, CPP and inquired If he could have his patient assistance forms mailed to him. Called patient informed that I will place patient assistance form for [place] and [place] with [employer] in the mail. Patient will need to sign all highlighted area and attach a copy of his income with form. Patient can either mail back to PCP office or drop off to PCP office so we can fax to [employer]. Patient agrees with plan. Patient also requested pen needles to be added to his patient assistance paperwork to be sent with medication. Patient aware I will add, patient is running out of needles, only has 3 left and requesting a prescription to be sent to [facility]. Patient aware I will request needles to be sent from PCP office. Robert Walters, CPP notified.   Pen Needles were sent by Robert Walters on 05/06/2020 to [facility].   Allergies:  No Known Allergies  Medications: Outpatient Encounter Medications as of 05/06/2020  Medication Sig   alfuzosin (UROXATRAL) 10 MG 24 hr tablet Take 1 tablet (10 mg total) by mouth daily.   Ascorbic Acid (VITAMIN C) 500 MG CAPS Take 500 mg by mouth daily.    aspirin EC 81 MG tablet Take 1 tablet (81 mg total) by mouth daily.   atorvastatin (LIPITOR) 40 MG tablet TAKE 1 TABLET BY MOUTH AT BEDTIME   b complex vitamins tablet Take 1 tablet by mouth daily.    cholecalciferol (VITAMIN D3) 25 MCG (1000 UNIT) tablet Take 2 each by mouth daily.    CINNAMON PO Take 1 tablet by mouth daily. 1000mg  daily   Coenzyme Q10 (COQ10) 200 MG CAPS Take 1 tablet by mouth daily.    empagliflozin (JARDIANCE) 25 MG TABS tablet Take 1 tablet (25 mg total) by mouth daily before breakfast.   FLUoxetine (PROZAC) 40 MG capsule Take 1  capsule (40 mg total) by mouth every morning.   Garlic 1000 MG CAPS Take 1 tablet by mouth daily.    glucose blood test strip Use to check blood sugars daily 3 times daily  E11.65   insulin degludec (TRESIBA FLEXTOUCH) 100 UNIT/ML FlexTouch Pen Inject 60 Units into the skin daily. (Patient taking differently: Inject 50 Units into the skin daily. )   lamoTRIgine (LAMICTAL) 150 MG tablet Take 1 tablet (150 mg total) by mouth 2 (two) times daily.   LORazepam (ATIVAN) 1 MG tablet TAKE 1 TABLET BY MOUTH THREE TIMES DAILY AS NEEDED FOR ANXIETY. GENERIC EQUIVALENT FOR ATIVAN.   losartan (COZAAR) 100 MG tablet Take 1 tablet (100 mg total) by mouth daily.   metFORMIN (GLUCOPHAGE) 1000 MG tablet Take 1 tablet (1,000 mg total) by mouth 2 (two) times daily with a meal.   metoprolol tartrate (LOPRESSOR) 25 MG tablet TAKE 1 TABLET BY MOUTH 2 TIMES DAILY. GENERIC EQUIVALENT FOR LOPRESSOR.   Multiple Vitamin (MULTIVITAMIN WITH MINERALS) TABS tablet Take 1 tablet by mouth daily.   Omega-3 Fatty Acids (FISH OIL) 1000 MG CAPS Take 1 capsule by mouth daily.   omeprazole (PRILOSEC) 40 MG capsule TAKE 1 CAPSULE BY MOUTH 20 MINUTES BEFORE BREAKFAST.   Semaglutide (RYBELSUS) 7 MG TABS Take 1 tablet by mouth daily.   vitamin B-12 (CYANOCOBALAMIN) 500 MCG tablet Take 500 mcg by mouth daily.  No facility-administered encounter medications on file as of 05/06/2020.    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 Application faxed to [employer]. Robert Walters, CPP aware.   Robert Walters, [place] Pharmacist Assistant [phone]

## 2020-05-12 ENCOUNTER — Telehealth: Payer: Self-pay

## 2020-05-12 NOTE — Chronic Care Management (AMB) (Signed)
Chronic Care Management Pharmacy Assistant   Name: Robert Walters  MRN: [id number] DOB: 08-16-1955  Reason for Encounter: Disease State- Diabetes, Hypertension and Lipid Adherence Call.  Patient Questions:  1.  Have you seen any other providers since your last visit?  Yes  03/27/2023 [name], FNP (General Practice)   2.  Any changes in your medicines or health? No   PCP : [name], FNP  Allergies:  No Known Allergies  Medications: Outpatient Encounter Medications as of 05/12/2020  Medication Sig   alfuzosin (UROXATRAL) 10 MG 24 hr tablet Take 1 tablet (10 mg total) by mouth daily.   Ascorbic Acid (VITAMIN C) 500 MG CAPS Take 500 mg by mouth daily.    aspirin EC 81 MG tablet Take 1 tablet (81 mg total) by mouth daily.   atorvastatin (LIPITOR) 40 MG tablet TAKE 1 TABLET BY MOUTH AT BEDTIME   b complex vitamins tablet Take 1 tablet by mouth daily.    cholecalciferol (VITAMIN D3) 25 MCG (1000 UNIT) tablet Take 2 each by mouth daily.    CINNAMON PO Take 1 tablet by mouth daily. 1000mg  daily   Coenzyme Q10 (COQ10) 200 MG CAPS Take 1 tablet by mouth daily.    empagliflozin (JARDIANCE) 25 MG TABS tablet Take 1 tablet (25 mg total) by mouth daily before breakfast.   FLUoxetine (PROZAC) 40 MG capsule Take 1 capsule (40 mg total) by mouth every morning.   Garlic 123XX123 MG CAPS Take 1 tablet by mouth daily.    glucose blood test strip Use to check blood sugars daily 3 times daily  E11.65   insulin degludec (TRESIBA FLEXTOUCH) 100 UNIT/ML FlexTouch Pen Inject 60 Units into the skin daily. (Patient taking differently: Inject 50 Units into the skin daily. )   Insulin Pen Needle (PEN NEEDLES) 32G X 4 MM MISC Use as directed   lamoTRIgine (LAMICTAL) 150 MG tablet Take 1 tablet (150 mg total) by mouth 2 (two) times daily.   LORazepam (ATIVAN) 1 MG tablet TAKE 1 TABLET BY MOUTH THREE TIMES DAILY AS NEEDED FOR ANXIETY. GENERIC EQUIVALENT FOR ATIVAN.   losartan (COZAAR)  100 MG tablet Take 1 tablet (100 mg total) by mouth daily.   metFORMIN (GLUCOPHAGE) 1000 MG tablet Take 1 tablet (1,000 mg total) by mouth 2 (two) times daily with a meal.   metoprolol tartrate (LOPRESSOR) 25 MG tablet TAKE 1 TABLET BY MOUTH 2 TIMES DAILY. GENERIC EQUIVALENT FOR LOPRESSOR.   Multiple Vitamin (MULTIVITAMIN WITH MINERALS) TABS tablet Take 1 tablet by mouth daily.   Omega-3 Fatty Acids (FISH OIL) 1000 MG CAPS Take 1 capsule by mouth daily.   omeprazole (PRILOSEC) 40 MG capsule TAKE 1 CAPSULE BY MOUTH 20 MINUTES BEFORE BREAKFAST.   Semaglutide (RYBELSUS) 7 MG TABS Take 1 tablet by mouth daily.   vitamin B-12 (CYANOCOBALAMIN) 500 MCG tablet Take 500 mcg by mouth daily.   No facility-administered encounter medications on file as of 05/12/2020.    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 07/25/2015   Dizziness 08/09/2014   TIA (transient ischemic attack) 05/15/2012   Blurred vision 05/15/2012   Ataxia 05/15/2012   Diabetes mellitus type 2 with complications ([place]) [id number]   BPH (benign prostatic hyperplasia) 05/15/2012   GERD (gastroesophageal reflux disease) 05/15/2012   Obesity 05/15/2012   Bipolar 1 disorder ([place]) 05/15/2012   Essential hypertension 10/30/2010   Cardiovascular risk factor 10/30/2010   Pure hypercholesterolemia 12/05/2008   Recent Relevant Labs: Lab Results  Component Value Date/Time    HGBA1C 7.3 (H) 03/26/2020 11:09 AM   HGBA1C 10.7 (H) 11/06/2019 10:53 AM   MICROALBUR 10 03/26/2020 01:16 PM   MICROALBUR 30 11/06/2019 04:23 PM    Kidney Function Lab Results  Component Value Date/Time   CREATININE 1.45 (H) 03/26/2020 11:09 AM   CREATININE 1.49 (H) 11/06/2019 10:53 AM   CREATININE 1.33 (H) 10/19/2016 02:51 PM   GFRNONAA 50 (L) 03/26/2020 11:09 AM   GFRAA 58 (L) 03/26/2020 11:09 AM     Current antihyperglycemic regimen:  o Jardiance 25 mg one tablet a day. o Tresiba Flextouch 100 unit / ML flextouch pen - 50 units in skin a day o Metformin 100 mg- one tablet bid o Rebelsus 7 mg  - one a day  What recent interventions/DTPs have been made to improve glycemic control:  o Patient states she has been taking her medication as directed.   Have there been any recent hospitalizations or ED visits since last visit with CPP? No   Patient denies hypoglycemic symptoms, including Pale, Sweaty, Shaky, Hungry, Nervous/irritable and Vision changes  Patient denies hyperglycemic symptoms, including blurry vision, excessive thirst, fatigue, polyuria and weakness   How often are you checking your blood sugar? Twice a day  What are your blood sugars ranging?  o Fasting: 05/11/2020 - 114 o Before meals: None o After meals: 05/11/2020 - 135 o Bedtime: None  During the week, how often does your blood glucose drop below 70?  No    Are you checking your feet daily/regularly? Yes, states he has no issues / no changes.  Adherence Review: Is the patient currently on a STATIN medication? Yes Is the patient currently on ACE/ARB medication? Yes  Does the patient have >5 day gap between last estimated fill dates? No  Reviewed chart prior to disease state call. Spoke with patient regarding BP  Recent Office Vitals: BP Readings from Last 3 Encounters:  03/26/20 124/86  03/26/20 124/86  11/19/19 (!) 146/63   Pulse Readings from Last 3 Encounters:  03/26/20 76  03/26/20  76  11/19/19 87    Wt Readings from Last 3 Encounters:  03/26/20 228 lb (103.4 kg)  03/26/20 228 lb (103.4 kg)  03/21/20 228 lb (103.4 kg)     Kidney Function Lab Results  Component Value Date/Time   CREATININE 1.45 (H) 03/26/2020 11:09 AM   CREATININE 1.49 (H) 11/06/2019 10:53 AM   CREATININE 1.33 (H) 10/19/2016 02:51 PM   GFRNONAA 50 (L) 03/26/2020 11:09 AM   GFRAA 58 (L) 03/26/2020 11:09 AM    BMP Latest Ref Rng & Units 03/26/2020 11/06/2019 07/04/2019  Glucose 65 - 99 mg/dL 140(H) 281(H) 255(H)  BUN 8 - 27 mg/dL 22 19 19   Creatinine 0.76 - 1.27 mg/dL 1.45(H) 1.49(H) 1.38(H)  BUN/Creat Ratio 10 - 24 15 13 14   Sodium 134 - 144 mmol/L 139 136 136  Potassium 3.5 - 5.2 mmol/L 4.9 5.2 5.1  Chloride  96 - 106 mmol/L 101 98 96  CO2 20 - 29 mmol/L 22 20 23   Calcium 8.6 - 10.2 mg/dL 10.0 9.7 9.8    Current antihypertensive regimen:  o Losartan 100 mg one a day o Metoprolol Tartrate 25 mg- one bid o   How often are you checking your Blood Pressure? Patient does not take blood pressure at home no blood pressure cuff.   Current home BP readings: None   What recent interventions/DTPs have been made by any provider to improve Blood Pressure control since last CPP Visit: Patient states she has been taking her medication as directed.     Any recent hospitalizations or ED visits since last visit with CPP? No   What diet changes have been made to improve Blood Pressure Control?  o Patient states he has been eating healthy and watches his salt intake o   What exercise is being done to improve your Blood Pressure Control?  o Patient states he is not active , no exercise regimen.  o   Adherence Review: Is the patient currently on ACE/ARB medication? Yes  Does the patient have >5 day gap between last estimated fill dates? No   Comprehensive medication review performed; Spoke to patient regarding cholesterol  Lipid Panel    Component Value Date/Time   CHOL 174 03/26/2020  1109   TRIG 213 (H) 03/26/2020 1109   HDL 40 03/26/2020 1109   LDLCALC 98 03/26/2020 1109   LDLDIRECT 124.3 11/10/2010 0953    10-year ASCVD risk score: The ASCVD Risk score Mikey Bussing DC Jr., et al., 2013) failed to calculate for the following reasons:   The patient has a prior MI or stroke diagnosis   Current antihyperlipidemic regimen:  o Atorvastatin 40 mg one a day o Omega -3 fish oil 1000 mg one a day  Previous antihyperlipidemic medications tried: No   ASCVD risk enhancing conditions: age >6, DM and HTN    What recent interventions/DTPs have been made by any provider to improve Cholesterol control since last CPP Visit: .Patient states she has been taking her medication as directed.   Any recent hospitalizations or ED visits since last visit with CPP?No   What diet changes have been made to improve Cholesterol?  o Patient states he has been watching his salt intake and eating healthy / states that he has been drinking more water. o   What exercise is being done to improve Cholesterol?  o No exercise/ patient states he is not active. o   Adherence Review: Does the patient have >5 day gap between last estimated fill dates? No    Goals Addressed            This Visit's Progress    Pharmacy Care Plan   Not on track    [place] (see longitudinal plan of care for additional care plan information)  Current Barriers:   Chronic Disease Management support, education, and care coordination needs related to Hypertension, Hyperlipidemia, and Diabetes   Hypertension BP Readings from Last 3 Encounters:  11/19/19 (!) 146/63  11/06/19 136/84  10/22/19 (!) 90/58    Pharmacist Clinical Goal(s): o Over the next 90 days, patient will work with PharmD and providers to achieve BP goal <130/80  Current regimen:  o Losartan 100mg  daily o Metoprolol tartrate 25mg  twice daily  Interventions:  Provided dietary and exercise recommendations  Patient was only taking  metoprolol once daily. Advised patient to increase to 1 tablet twice daily as directed  Discussed  why patient is taking losartan  Patient self care activities - Over the next 90 days, patient will: o Check BP 2-3 times weekly, document, and provide at future appointments o Ensure daily salt intake < 2300 mg/day o Start taking metoprolol tartrate 1 tablet TWICE daily as directed o Work on exercising more with a goal of 30 minutes daily 5 times per week (150 minutes total)  Hyperlipidemia Lab Results  Component Value Date/Time   LDLCALC 110 (H) 11/06/2019 10:53 AM   LDLDIRECT 124.3 11/10/2010 09:53 AM    Pharmacist Clinical Goal(s): o Over the next 90 days, patient will work with PharmD and providers to achieve LDL goal < 70  Current regimen:  o Atorvastatin 40mg  daily  Interventions: o Provided dietary and exercise recommendations  Patient self care activities - Over the next 90 days, patient will: o Work on exercising more with a goal of 30 minutes daily 5 times per week (150 minutes total)  Diabetes Lab Results  Component Value Date/Time   HGBA1C 10.7 (H) 11/06/2019 10:53 AM   HGBA1C 9.9 (H) 07/04/2019 12:33 PM    Pharmacist Clinical Goal(s): o Over the next 90 days, patient will work with PharmD and providers to achieve A1c goal <7%  Current regimen:  o Jardiance 10mg  daily before breakfast o Tresiba 45 units daily o Metformin 1000mg  twice daily o Rybelsus 3mg  daily  Interventions: o Provided dietary and exercise recommendations - Recommend patient focus on eating healthier, well-balanced meals o Determined patient is speaking with a diabetic educator weekly to adjust insulin and discuss diet o Patient assistance programs [name] and Rybelsus 7mg  through [employer], Jardiance 25mg  through [employer]) - Patient provided proof of income and signed patient portion of applications - Applications provided to PCP to sign - Completed applications faxed to respective  patient assistance programs o Discussed administration site for [place] and duration  o Discussed timing of Rybelsus administration  Patient self care activities - Over the next 90 days, patient will: o Check blood sugar twice daily, document, and provide at future appointments o Contact provider with any episodes of hypoglycemia o Work on exercising more with a goal of 30 minutes daily 5 times per week (150 minutes total) o Decrease carbohydrate consumption with each meal o Reduce intake of sweet tea  Medication management  Pharmacist Clinical Goal(s): o Over the next 90 days, patient will work with PharmD and providers to achieve optimal medication adherence  Current pharmacy: [facility] (mail service) [facility]  Interventions o Comprehensive medication review performed. o Continue current medication management strategy  Patient self care activities - Over the next 90 days, patient will: o Focus on medication adherence by utilizing a pill box o Take medications as prescribed o Report any questions or concerns to PharmD and/or provider(s)  Initial goal documentation       Follow-Up:  Pharmacist Review - Patient is taking his blood sugars, but he does not take his blood pressures/ he has been eating healthier , but he does not exercise any. He has been  meeting some goals.  [name], CPP Notified  Judithann Sheen, [place] Pharmacist Assistant [phone]

## 2020-05-19 ENCOUNTER — Telehealth: Payer: Medicare Other

## 2020-06-10 ENCOUNTER — Encounter: Payer: Self-pay | Admitting: Physician Assistant

## 2020-06-10 ENCOUNTER — Telehealth (INDEPENDENT_AMBULATORY_CARE_PROVIDER_SITE_OTHER): Payer: Medicare Other | Admitting: Physician Assistant

## 2020-06-10 ENCOUNTER — Other Ambulatory Visit: Payer: Self-pay | Admitting: Nurse Practitioner

## 2020-06-10 DIAGNOSIS — F411 Generalized anxiety disorder: Secondary | ICD-10-CM | POA: Diagnosis not present

## 2020-06-10 DIAGNOSIS — F3341 Major depressive disorder, recurrent, in partial remission: Secondary | ICD-10-CM

## 2020-06-10 DIAGNOSIS — E119 Type 2 diabetes mellitus without complications: Secondary | ICD-10-CM

## 2020-06-10 DIAGNOSIS — N183 Chronic kidney disease, stage 3 unspecified: Secondary | ICD-10-CM

## 2020-06-10 MED ORDER — LORAZEPAM 1 MG PO TABS: ORAL_TABLET | ORAL | 1 refills | Status: DC

## 2020-06-10 MED ORDER — LAMOTRIGINE 150 MG PO TABS: 150.0000 mg | ORAL_TABLET | Freq: Two times a day (BID) | ORAL | 1 refills | Status: DC

## 2020-06-10 MED ORDER — FLUOXETINE HCL 40 MG PO CAPS: 40.0000 mg | ORAL_CAPSULE | Freq: Every morning | ORAL | 1 refills | Status: DC

## 2020-06-10 NOTE — Progress Notes (Signed)
[facility] Med Check  Patient ID: Robert Walters,  MRN: [id number]  PCP: [name], [place]  Date of Evaluation: 06/10/2020 Time spent:25 minutes  Chief Complaint:  Chief Complaint    Anxiety; Depression     Virtual Visit via Telehealth  I connected with patient by telephone, with their informed consent, and verified patient privacy and that I am speaking with the correct person using two identifiers.  I am private, in my office and the patient is at home.  He has no way of doing a video visit.  I discussed the limitations, risks, security and privacy concerns of performing an evaluation and management service by telephone and the availability of in person appointments. I also discussed with the patient that there may be a patient responsible charge related to this service. The patient expressed understanding and agreed to proceed.   I discussed the assessment and treatment plan with the patient. The patient was provided an opportunity to ask questions and all were answered. The patient agreed with the plan and demonstrated an understanding of the instructions.   The patient was advised to call back or seek an in-person evaluation if the symptoms worsen or if the condition fails to improve as anticipated.  I provided 25 minutes of non-face-to-face time during this encounter.  HISTORY/CURRENT STATUS: HPI For routine med check.  Meds are doing well. Gets lonely, since his Dad died about 1 1/2 years ago and he's not going to NH everyday. Doesn't have much to do. "I know medicines ain't going to help that."   He is able to enjoy things when he gets a chance but does not do it often.  Energy and motivation are fair but no different than it has been.  Does not cry easily, he does isolate some but more so due to [place] than anything.  No change in memory.  Anxiety is a problem especially if he has to go out somewhere.  He will also get worrying about things at home like the state of things  going on in the world and that makes him anxious.  The Ativan does help.  No suicidal or homicidal thoughts.  Patient denies increased energy with decreased need for sleep, no increased talkativeness, no racing thoughts, no impulsivity or risky behaviors, no increased spending, no increased libido, no grandiosity.  Denies dizziness, syncope, seizures, numbness, tingling, tremor, tics, unsteady gait, slurred speech, confusion. Denies muscle or joint pain, stiffness, or dystonia.  Individual Medical History/ Review of Systems: Changes? :Yes Diabetes and cholesterol are controlled as far as he knows.  Past medications for mental health diagnoses include: Ambien, Ativan, Epitol, Lamictal, Prozac, Xanax, Wellbutrin  Allergies: Patient has no known allergies.  Current Medications:  Current Outpatient Medications:  .  alfuzosin (UROXATRAL) 10 MG 24 hr tablet, Take 1 tablet (10 mg total) by mouth daily., Disp: 30 tablet, Rfl: 2 .  Ascorbic Acid (VITAMIN C) 500 MG CAPS, Take 500 mg by mouth daily. , Disp: , Rfl:  .  aspirin EC 81 MG tablet, Take 1 tablet (81 mg total) by mouth daily., Disp: , Rfl:  .  atorvastatin (LIPITOR) 40 MG tablet, TAKE 1 TABLET BY MOUTH AT BEDTIME, Disp: 90 tablet, Rfl: 1 .  b complex vitamins tablet, Take 1 tablet by mouth daily. , Disp: , Rfl:  .  cholecalciferol (VITAMIN D3) 25 MCG (1000 UNIT) tablet, Take 2 each by mouth daily. , Disp: , Rfl:  .  CINNAMON PO, Take 1 tablet by mouth daily.  1000mg  daily, Disp: , Rfl:  .  Coenzyme Q10 (COQ10) 200 MG CAPS, Take 1 tablet by mouth daily. , Disp: , Rfl:  .  empagliflozin (JARDIANCE) 25 MG TABS tablet, Take 1 tablet (25 mg total) by mouth daily before breakfast., Disp: 30 tablet, Rfl: 2 .  Garlic 2542 MG CAPS, Take 1 tablet by mouth daily. , Disp: , Rfl:  .  glucose blood test strip, Use to check blood sugars daily 3 times daily  E11.65, Disp: 200 each, Rfl: 3 .  insulin degludec (TRESIBA FLEXTOUCH) 100 UNIT/ML FlexTouch Pen,  Inject 60 Units into the skin daily. (Patient taking differently: Inject 50 Units into the skin daily.), Disp: 15 mL, Rfl: 2 .  Insulin Pen Needle (PEN NEEDLES) 32G X 4 MM MISC, Use as directed, Disp: 100 each, Rfl: 0 .  losartan (COZAAR) 100 MG tablet, Take 1 tablet (100 mg total) by mouth daily., Disp: 90 tablet, Rfl: 3 .  metoprolol tartrate (LOPRESSOR) 25 MG tablet, TAKE 1 TABLET BY MOUTH 2 TIMES DAILY. GENERIC EQUIVALENT FOR LOPRESSOR., Disp: 180 tablet, Rfl: 3 .  Multiple Vitamin (MULTIVITAMIN WITH MINERALS) TABS tablet, Take 1 tablet by mouth daily., Disp: , Rfl:  .  Omega-3 Fatty Acids (FISH OIL) 1000 MG CAPS, Take 1 capsule by mouth daily., Disp: , Rfl:  .  omeprazole (PRILOSEC) 40 MG capsule, TAKE 1 CAPSULE BY MOUTH 20 MINUTES BEFORE BREAKFAST., Disp: 90 capsule, Rfl: 0 .  Semaglutide (RYBELSUS) 7 MG TABS, Take 1 tablet by mouth daily., Disp: 30 tablet, Rfl: 2 .  vitamin B-12 (CYANOCOBALAMIN) 500 MCG tablet, Take 500 mcg by mouth daily., Disp: , Rfl:  .  FLUoxetine (PROZAC) 40 MG capsule, Take 1 capsule (40 mg total) by mouth every morning., Disp: 90 capsule, Rfl: 1 .  lamoTRIgine (LAMICTAL) 150 MG tablet, Take 1 tablet (150 mg total) by mouth 2 (two) times daily., Disp: 180 tablet, Rfl: 1 .  LORazepam (ATIVAN) 1 MG tablet, TAKE 1 TABLET BY MOUTH THREE TIMES DAILY AS NEEDED FOR ANXIETY. GENERIC EQUIVALENT FOR ATIVAN., Disp: 270 tablet, Rfl: 1 .  metFORMIN (GLUCOPHAGE) 1000 MG tablet, TAKE 1 TABLET BY MOUTH 2 TIMES DAILY WITH MEALS, Disp: 180 tablet, Rfl: 1 Medication Side Effects: none  Family Medical/ Social History: Changes?  No MENTAL HEALTH EXAM:  There were no vitals taken for this visit.There is no height or weight on file to calculate BMI.  General Appearance: unable to assess  Eye Contact:  unable to assess  Speech:  Clear and Coherent and Normal Rate  Volume:  Normal  Mood:  Euthymic  Affect:  unable to assess  Thought Process:  Goal Directed and Descriptions of  Associations: Intact  Orientation:  Full (Time, Place, and Person)  Thought Content: Logical   Suicidal Thoughts:  No  Homicidal Thoughts:  No  Memory:  WNL  Judgement:  Good  Insight:  Good  Psychomotor Activity:  unable to assess  Concentration:  Concentration: Good and Attention Span: Good  Recall:  Good  Fund of Knowledge: Good  Language: Good  Assets:  Desire for Improvement  ADL's:  Intact  Cognition: WNL  Prognosis:  Good    DIAGNOSES:    ICD-10-CM   1. Recurrent major depressive disorder, in partial remission ([place])  F33.41   2. Generalized anxiety disorder  F41.1     Receiving Psychotherapy: No    RECOMMENDATIONS:  PDMP was reviewed. I provided 25 minutes of nonface-to-face time during this encounter, discussing his current medication regimen.  He has responded well so no changes will be made. Continue Prozac 40 mg every morning. Continue Lamictal 150 mg 1 p.o. twice daily. Continue Ativan 1 mg, 1 p.o. 3 times daily as needed. Return in 6 months.  Donnal Moat, PA-C

## 2020-06-11 ENCOUNTER — Telehealth: Payer: Self-pay

## 2020-06-11 NOTE — Chronic Care Management (AMB) (Signed)
Chronic Care Management Pharmacy Assistant   Name: Robert Walters  MRN: [id number] DOB: 1955/07/26  Reason for Encounter: Diabetes Adherence Call  Patient Questions:  1.  Have you seen any other providers since your last visit? No    2.  Any changes in your medicines or health? No    PCP : [name], FNP  Allergies:  No Known Allergies  Medications: Outpatient Encounter Medications as of 06/11/2020  Medication Sig   alfuzosin (UROXATRAL) 10 MG 24 hr tablet Take 1 tablet (10 mg total) by mouth daily.   Ascorbic Acid (VITAMIN C) 500 MG CAPS Take 500 mg by mouth daily.    aspirin EC 81 MG tablet Take 1 tablet (81 mg total) by mouth daily.   atorvastatin (LIPITOR) 40 MG tablet TAKE 1 TABLET BY MOUTH AT BEDTIME   b complex vitamins tablet Take 1 tablet by mouth daily.    cholecalciferol (VITAMIN D3) 25 MCG (1000 UNIT) tablet Take 2 each by mouth daily.    CINNAMON PO Take 1 tablet by mouth daily. 1000mg  daily   Coenzyme Q10 (COQ10) 200 MG CAPS Take 1 tablet by mouth daily.    empagliflozin (JARDIANCE) 25 MG TABS tablet Take 1 tablet (25 mg total) by mouth daily before breakfast.   FLUoxetine (PROZAC) 40 MG capsule Take 1 capsule (40 mg total) by mouth every morning.   Garlic 1194 MG CAPS Take 1 tablet by mouth daily.    glucose blood test strip Use to check blood sugars daily 3 times daily  E11.65   insulin degludec (TRESIBA FLEXTOUCH) 100 UNIT/ML FlexTouch Pen Inject 60 Units into the skin daily. (Patient taking differently: Inject 50 Units into the skin daily.)   Insulin Pen Needle (PEN NEEDLES) 32G X 4 MM MISC Use as directed   lamoTRIgine (LAMICTAL) 150 MG tablet Take 1 tablet (150 mg total) by mouth 2 (two) times daily.   LORazepam (ATIVAN) 1 MG tablet TAKE 1 TABLET BY MOUTH THREE TIMES DAILY AS NEEDED FOR ANXIETY. GENERIC EQUIVALENT FOR ATIVAN.   losartan (COZAAR) 100 MG tablet Take 1 tablet (100 mg total) by mouth daily.   metFORMIN (GLUCOPHAGE) 1000 MG  tablet TAKE 1 TABLET BY MOUTH 2 TIMES DAILY WITH MEALS   metoprolol tartrate (LOPRESSOR) 25 MG tablet TAKE 1 TABLET BY MOUTH 2 TIMES DAILY. GENERIC EQUIVALENT FOR LOPRESSOR.   Multiple Vitamin (MULTIVITAMIN WITH MINERALS) TABS tablet Take 1 tablet by mouth daily.   Omega-3 Fatty Acids (FISH OIL) 1000 MG CAPS Take 1 capsule by mouth daily.   omeprazole (PRILOSEC) 40 MG capsule TAKE 1 CAPSULE BY MOUTH 20 MINUTES BEFORE BREAKFAST.   Semaglutide (RYBELSUS) 7 MG TABS Take 1 tablet by mouth daily.   vitamin B-12 (CYANOCOBALAMIN) 500 MCG tablet Take 500 mcg by mouth daily.   No facility-administered encounter medications on file as of 06/11/2020.    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 07/25/2015  Dizziness 08/09/2014   TIA (transient ischemic attack) 05/15/2012   Blurred vision 05/15/2012   Ataxia 05/15/2012   Diabetes mellitus type 2 with complications (Robert Walters) 53/97/6734   BPH (benign prostatic hyperplasia) 05/15/2012   GERD (gastroesophageal reflux disease) 05/15/2012   Obesity 05/15/2012   Bipolar 1 disorder (Robert Walters) 05/15/2012   Essential hypertension 10/30/2010   Cardiovascular risk factor 10/30/2010   Pure hypercholesterolemia 12/05/2008   Recent Relevant Labs: Lab Results  Component Value Date/Time   HGBA1C 7.3 (H) 03/26/2020 11:09 AM   HGBA1C 10.7 (H) 11/06/2019 10:53 AM   MICROALBUR 10 03/26/2020 01:16 PM    MICROALBUR 30 11/06/2019 04:23 PM    Kidney Function Lab Results  Component Value Date/Time   CREATININE 1.45 (H) 03/26/2020 11:09 AM   CREATININE 1.49 (H) 11/06/2019 10:53 AM   CREATININE 1.33 (H) 10/19/2016 02:51 PM   GFRNONAA 50 (L) 03/26/2020 11:09 AM   GFRAA 58 (L) 03/26/2020 11:09 AM     Current antihyperglycemic regimen:  ? Jardiance 10mg  daily before breakfast ? Tresiba 45 units daily (per patient dosage increase to 50 units about a month and a half ago) ? Metformin 1000mg  twice daily ? Rybelsus 3mg  daily   What recent interventions/DTPs have been made to improve glycemic control:  o Patient states he is taking medications as directed.   Have there been any recent hospitalizations or ED visits since last visit with CPP? No    Patient denies hypoglycemic symptoms, including Pale, Sweaty, Shaky, Hungry, Nervous/irritable and Vision changes    Patient denies hyperglycemic symptoms, including blurry vision, excessive thirst, fatigue, polyuria and weakness    How often are you checking your blood sugar? 3-4 times daily    What are your blood sugars ranging? Patient reports his blood sugars have been 170, 125, 135 when checking. o Fasting: Per patient it has been in the 180s. Stated something he ate the day before could've triggered it to raise. o Before meals: none o After meals: none o Bedtime: none   During the week, how often does your blood glucose drop below 70? Never    Are you checking your feet daily/regularly? Patient checks feet every other day. Patient states he see his foot doctor every 3 months as well.  Adherence Review: Is the patient currently on a STATIN medication? Yes Is the patient currently on ACE/ARB medication? Yes Does the patient have >5 day gap between last estimated fill dates? No    Goals Addressed            This Visit's Progress    Pharmacy Care Plan   Not on track    [place] (see longitudinal plan of care for  additional care plan information)  Current Barriers:   Chronic Disease Management support, education, and care coordination needs related to Hypertension, Hyperlipidemia, and Diabetes   Hypertension BP Readings from Last 3 Encounters:  11/19/19 (!) 146/63  11/06/19 136/84  10/22/19 (!) 90/58    Pharmacist Clinical Goal(s): o Over the next 90 days, patient will work with PharmD and providers to achieve BP goal <130/80  Current regimen:  o Losartan 100mg  daily o Metoprolol tartrate 25mg  twice daily  Interventions:  Provided dietary and exercise recommendations  Patient was only taking metoprolol once daily. Advised patient to increase to 1 tablet twice daily as directed  Discussed why patient is taking losartan  Patient self care activities - Over the next 90 days, patient will: o Check BP 2-3 times weekly, document, and provide at  future appointments o Ensure daily salt intake < 2300 mg/day o Start taking metoprolol tartrate 1 tablet TWICE daily as directed o Work on exercising more with a goal of 30 minutes daily 5 times per week (150 minutes total)  Hyperlipidemia Lab Results  Component Value Date/Time   LDLCALC 110 (H) 11/06/2019 10:53 AM   LDLDIRECT 124.3 11/10/2010 09:53 AM    Pharmacist Clinical Goal(s): o Over the next 90 days, patient will work with PharmD and providers to achieve LDL goal < 70  Current regimen:  o Atorvastatin 40mg  daily  Interventions: o Provided dietary and exercise recommendations  Patient self care activities - Over the next 90 days, patient will: o Work on exercising more with a goal of 30 minutes daily 5 times per week (150 minutes total)  Diabetes Lab Results  Component Value Date/Time   HGBA1C 10.7 (H) 11/06/2019 10:53 AM   HGBA1C 9.9 (H) 07/04/2019 12:33 PM    Pharmacist Clinical Goal(s): o Over the next 90 days, patient will work with PharmD and providers to achieve A1c goal <7%  Current regimen:  o Jardiance 10mg   daily before breakfast o Tresiba 45 units daily o Metformin 1000mg  twice daily o Rybelsus 3mg  daily  Interventions: o Provided dietary and exercise recommendations - Recommend patient focus on eating healthier, well-balanced meals o Determined patient is speaking with a diabetic educator weekly to adjust insulin and discuss diet o Patient assistance programs [name] and Rybelsus 7mg  through [employer], Jardiance 25mg  through [employer]) - Patient provided proof of income and signed patient portion of applications - Applications provided to PCP to sign - Completed applications faxed to respective patient assistance programs o Discussed administration site for [place] and duration  o Discussed timing of Rybelsus administration  Patient self care activities - Over the next 90 days, patient will: o Check blood sugar twice daily, document, and provide at future appointments o Contact provider with any episodes of hypoglycemia o Work on exercising more with a goal of 30 minutes daily 5 times per week (150 minutes total) o Decrease carbohydrate consumption with each meal o Reduce intake of sweet tea  Medication management  Pharmacist Clinical Goal(s): o Over the next 90 days, patient will work with PharmD and providers to achieve optimal medication adherence  Current pharmacy: [facility] (mail service) [facility]  Interventions o Comprehensive medication review performed. o Continue current medication management strategy  Patient self care activities - Over the next 90 days, patient will: o Focus on medication adherence by utilizing a pill box o Take medications as prescribed o Report any questions or concerns to PharmD and/or provider(s)  Initial goal documentation        Follow-Up:  Pharmacist Review-Patient reports he received a letter dated 06/04/20 from [employer] stating they are missing income verification in application. Patient stated he  attached his social security statement when submitting application 2 or 3 weeks ago.  Patient voiced with me he will like an update regarding the status of Rybelsus assistance because he would hate to be without his medication. Reassured the patient Judithann Sheen was in the process of checking the status regarding his concern. The patient verbalized understanding.   [name], CPP Notified.  Raynelle Highland, [place] Pharmacist Assistant [phone] CCM Total Time:

## 2020-06-11 NOTE — Chronic Care Management (AMB) (Signed)
Chronic Care Management Pharmacy Assistant   Name: Robert Walters  MRN: [id number] DOB: 11/17/1955  Reason for Encounter: Medication Review    PCP : Robert Brine, FNP  Allergies:  No Known Allergies  Medications: Outpatient Encounter Medications as of 06/11/2020  Medication Sig   alfuzosin (UROXATRAL) 10 MG 24 hr tablet Take 1 tablet (10 mg total) by mouth daily.   Ascorbic Acid (VITAMIN C) 500 MG CAPS Take 500 mg by mouth daily.    aspirin EC 81 MG tablet Take 1 tablet (81 mg total) by mouth daily.   atorvastatin (LIPITOR) 40 MG tablet TAKE 1 TABLET BY MOUTH AT BEDTIME   b complex vitamins tablet Take 1 tablet by mouth daily.    cholecalciferol (VITAMIN D3) 25 MCG (1000 UNIT) tablet Take 2 each by mouth daily.    CINNAMON PO Take 1 tablet by mouth daily. 1000mg  daily   Coenzyme Q10 (COQ10) 200 MG CAPS Take 1 tablet by mouth daily.    empagliflozin (JARDIANCE) 25 MG TABS tablet Take 1 tablet (25 mg total) by mouth daily before breakfast.   FLUoxetine (PROZAC) 40 MG capsule Take 1 capsule (40 mg total) by mouth every morning.   Garlic 5400 MG CAPS Take 1 tablet by mouth daily.    glucose blood test strip Use to check blood sugars daily 3 times daily  E11.65   insulin degludec (TRESIBA FLEXTOUCH) 100 UNIT/ML FlexTouch Pen Inject 60 Units into the skin daily. (Patient taking differently: Inject 50 Units into the skin daily.)   Insulin Pen Needle (PEN NEEDLES) 32G X 4 MM MISC Use as directed   lamoTRIgine (LAMICTAL) 150 MG tablet Take 1 tablet (150 mg total) by mouth 2 (two) times daily.   LORazepam (ATIVAN) 1 MG tablet TAKE 1 TABLET BY MOUTH THREE TIMES DAILY AS NEEDED FOR ANXIETY. GENERIC EQUIVALENT FOR ATIVAN.   losartan (COZAAR) 100 MG tablet Take 1 tablet (100 mg total) by mouth daily.   metFORMIN (GLUCOPHAGE) 1000 MG tablet TAKE 1 TABLET BY MOUTH 2 TIMES DAILY WITH MEALS   metoprolol tartrate (LOPRESSOR) 25 MG tablet TAKE 1 TABLET BY MOUTH 2 TIMES DAILY.  GENERIC EQUIVALENT FOR LOPRESSOR.   Multiple Vitamin (MULTIVITAMIN WITH MINERALS) TABS tablet Take 1 tablet by mouth daily.   Omega-3 Fatty Acids (FISH OIL) 1000 MG CAPS Take 1 capsule by mouth daily.   omeprazole (PRILOSEC) 40 MG capsule TAKE 1 CAPSULE BY MOUTH 20 MINUTES BEFORE BREAKFAST.   Semaglutide (RYBELSUS) 7 MG TABS Take 1 tablet by mouth daily.   vitamin B-12 (CYANOCOBALAMIN) 500 MCG tablet Take 500 mcg by mouth daily.   No facility-administered encounter medications on file as of 06/11/2020.    Current Diagnosis: Patient Active Problem List   Diagnosis Date Noted   Chronic diastolic heart failure (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 07/25/2015   Dizziness 08/09/2014   TIA (transient ischemic attack) 05/15/2012   Blurred vision 05/15/2012   Ataxia 05/15/2012   Diabetes mellitus type 2 with complications ([place]) 86/76/1952  BPH (benign prostatic hyperplasia) 05/15/2012   GERD (gastroesophageal reflux disease) 05/15/2012   Obesity 05/15/2012   Bipolar 1 disorder ([place]) 05/15/2012   Essential hypertension 10/30/2010   Cardiovascular risk factor 10/30/2010   Pure hypercholesterolemia 12/05/2008      Follow-Up:  Patient Assistance Coordination - Patient called wanted to know if Dorita Fray, [place] office had received his patient assistance application that he had mailed to them for his Rybelsus . Told patient we would check on status of application and call him back. Patient voiced understanding.  Send message to  Pattricia Boss to check on status of application with teams.  [name],CPP Notified  Judithann Sheen, [facility] Clinical Pharmacist Assistant [phone]

## 2020-06-13 ENCOUNTER — Telehealth: Payer: Self-pay

## 2020-06-13 NOTE — Chronic Care Management (AMB) (Signed)
06/13/2020- Called patient regarding Rybelsus and [name] follow up letter from [employer] that he received in the mail. Patient stated the letter from [employer] needs an updated SS income documentation, they can not use the bank statements that were sent to them with application. Patient informed that we will need the income document statement that he gets yearly from [facility] of his benefits. Patient looked for document and he has one for 2021. Patient aware we can try and use that statement to see if [employer] will accept and he is placing in the mail to PCP office. Patient aware that we will check next Tuesday to see if mail was received.  06/18/2020- SS income document was received at PCP office today. Called patient to inform that I will get document tomorrow and refax his application with it. Patient is wondering if office any samples of Rybelsus 14 mg and Tresiba. He has 3 days left on Rybelsus and 15 days left of Tresiba. Patient aware I will check with PCP office and return his call.   7/49/4496- Application refaxed with correct SS income document, office only had samples of Rybelsus available and placed patient's original forms back in the mail to patient. Patient to be notified by regarding samples. [name], CPP notified.  Patient was approved for his medications with [employer], patient had a follow up visit with [name], FNP on 06/30/2020.     Pattricia Boss, [place] Pharmacist Assistant [phone]

## 2020-06-17 ENCOUNTER — Telehealth (INDEPENDENT_AMBULATORY_CARE_PROVIDER_SITE_OTHER): Payer: Self-pay | Admitting: Gastroenterology

## 2020-06-17 ENCOUNTER — Other Ambulatory Visit (INDEPENDENT_AMBULATORY_CARE_PROVIDER_SITE_OTHER): Payer: Self-pay

## 2020-06-17 DIAGNOSIS — K219 Gastro-esophageal reflux disease without esophagitis: Secondary | ICD-10-CM

## 2020-06-17 MED ORDER — OMEPRAZOLE 40 MG PO CPDR: DELAYED_RELEASE_CAPSULE | ORAL | 0 refills | Status: DC

## 2020-06-17 NOTE — Telephone Encounter (Signed)
Patient left 2 voice mail messages - states [facility] has sent 2 refill request - did not say what medication - please advise - ph# [phone]

## 2020-06-17 NOTE — Telephone Encounter (Signed)
I spoke with the patient and he is aware that we have not received any thing from [place], but I have sent in the omeprazole at his request to [facility].

## 2020-06-18 DIAGNOSIS — M542 Cervicalgia: Secondary | ICD-10-CM | POA: Diagnosis not present

## 2020-06-20 ENCOUNTER — Other Ambulatory Visit: Payer: Self-pay

## 2020-06-20 DIAGNOSIS — E119 Type 2 diabetes mellitus without complications: Secondary | ICD-10-CM

## 2020-06-20 MED ORDER — RYBELSUS 14 MG PO TABS: 14.0000 mg | ORAL_TABLET | Freq: Every day | ORAL | 0 refills | Status: DC

## 2020-06-20 MED ORDER — TRESIBA FLEXTOUCH 100 UNIT/ML ~~LOC~~ SOPN: 50.0000 [IU] | PEN_INJECTOR | Freq: Every day | SUBCUTANEOUS | 0 refills | Status: DC

## 2020-06-23 DIAGNOSIS — I739 Peripheral vascular disease, unspecified: Secondary | ICD-10-CM | POA: Diagnosis not present

## 2020-06-24 ENCOUNTER — Encounter: Payer: Self-pay | Admitting: Nurse Practitioner

## 2020-06-27 DIAGNOSIS — M542 Cervicalgia: Secondary | ICD-10-CM | POA: Diagnosis not present

## 2020-06-30 ENCOUNTER — Ambulatory Visit (INDEPENDENT_AMBULATORY_CARE_PROVIDER_SITE_OTHER): Payer: Medicare Other | Admitting: Nurse Practitioner

## 2020-06-30 ENCOUNTER — Other Ambulatory Visit: Payer: Self-pay

## 2020-06-30 ENCOUNTER — Encounter: Payer: Self-pay | Admitting: Nurse Practitioner

## 2020-06-30 VITALS — BP 116/72 | HR 94 | Temp 97.9°F | Ht 69.0 in | Wt 232.0 lb

## 2020-06-30 DIAGNOSIS — I700 Atherosclerosis of aorta: Secondary | ICD-10-CM

## 2020-06-30 DIAGNOSIS — E782 Mixed hyperlipidemia: Secondary | ICD-10-CM

## 2020-06-30 DIAGNOSIS — Z79899 Other long term (current) drug therapy: Secondary | ICD-10-CM

## 2020-06-30 DIAGNOSIS — I10 Essential (primary) hypertension: Secondary | ICD-10-CM | POA: Diagnosis not present

## 2020-06-30 DIAGNOSIS — E119 Type 2 diabetes mellitus without complications: Secondary | ICD-10-CM | POA: Diagnosis not present

## 2020-06-30 NOTE — Progress Notes (Signed)
Rutherford Nail as a scribe for [name], FNP.,have documented all relevant documentation on the behalf of [name], FNP,as directed by  [name], FNP while in the presence of [name], [place]. This visit occurred during the SARS-CoV-2 public health emergency.  Safety protocols were in place, including screening questions prior to the visit, additional usage of staff PPE, and extensive cleaning of exam room while observing appropriate contact time as indicated for disinfecting solutions.  Subjective:     Patient ID: Robert Walters , male    DOB: [birth year] , 65 y.o.   MRN: [id number]   Chief Complaint  Patient presents with  . Diabetes    HPI   Pt is here today to follow up on d/m he doesn't have any other concerns going on. He is receiving Jardiance, Rybelsus and tresiba through patient assistance   Wt Readings from Last 3 Encounters: 06/30/20 : 232 lb (105.2 kg) 03/26/20 : 228 lb (103.4 kg) 03/26/20 : 228 lb (103.4 kg)   Diabetes He presents for his follow-up diabetic visit. He has type 2 diabetes mellitus. His disease course has been stable. There are no hypoglycemic associated symptoms. Pertinent negatives for hypoglycemia include no dizziness or headaches. There are no diabetic associated symptoms. Pertinent negatives for diabetes include no chest pain, no fatigue, no polydipsia, no polyphagia and no polyuria. There are no hypoglycemic complications. Symptoms are stable. Diabetic complications include heart disease. Risk factors for coronary artery disease include diabetes mellitus, sedentary lifestyle, male sex and obesity. Current diabetic treatment includes oral agent (monotherapy). He is compliant with treatment all of the time. He is following a generally healthy diet. He has not had a previous visit with a dietitian. He rarely participates in exercise. (Blood sugar have been 130 - 150 mostly, occasionally will go up 200.  ) An ACE inhibitor/angiotensin II  receptor blocker is being taken. Eye exam is current.  Hypertension This is a chronic problem. The current episode started more than 1 year ago. The problem is unchanged. The problem is controlled. Pertinent negatives include no anxiety, chest pain, headaches or palpitations. There are no associated agents to hypertension. Risk factors for coronary artery disease include obesity and sedentary lifestyle. There are no compliance problems.      Past Medical History:  Diagnosis Date  . Acid reflux disease   . Bipolar 1 disorder, mixed ([place])   . BPH (benign prostatic hypertrophy)   . CVA (cerebral vascular accident) ([place]) 07/2015  . Gastritis   . H/O hiatal hernia   . Hyperlipidemia   . Hypertension   . Normal nuclear stress test Dec 2011  . Obesity   . TIA (transient ischemic attack) 05/15/2012   "they think I've had one this am @ 0130" (05/15/2012)  . Type II diabetes mellitus ([place])   . Vertigo      Family History  Problem Relation Age of Onset  . Hyperlipidemia Father        in a nursing home  . Colon cancer Father   . Stroke Mother 7     Current Outpatient Medications:  .  alfuzosin (UROXATRAL) 10 MG 24 hr tablet, Take 1 tablet (10 mg total) by mouth daily., Disp: 30 tablet, Rfl: 2 .  Ascorbic Acid (VITAMIN C) 500 MG CAPS, Take 500 mg by mouth daily. , Disp: , Rfl:  .  aspirin EC 81 MG tablet, Take 1 tablet (81 mg total) by mouth daily., Disp: , Rfl:  .  atorvastatin (LIPITOR) 40  MG tablet, TAKE 1 TABLET BY MOUTH AT BEDTIME, Disp: 90 tablet, Rfl: 1 .  b complex vitamins tablet, Take 1 tablet by mouth daily. , Disp: , Rfl:  .  cholecalciferol (VITAMIN D3) 25 MCG (1000 UNIT) tablet, Take 2 each by mouth daily. , Disp: , Rfl:  .  CINNAMON PO, Take 1 tablet by mouth daily. 1093m daily, Disp: , Rfl:  .  Coenzyme Q10 (COQ10) 200 MG CAPS, Take 1 tablet by mouth daily. , Disp: , Rfl:  .  empagliflozin (JARDIANCE) 25 MG TABS tablet, Take 1 tablet (25 mg total) by mouth daily before  breakfast. (Patient taking differently: Take 25 mg by mouth every other day.), Disp: 30 tablet, Rfl: 2 .  FLUoxetine (PROZAC) 40 MG capsule, Take 1 capsule (40 mg total) by mouth every morning., Disp: 90 capsule, Rfl: 1 .  Garlic 15465MG CAPS, Take 1 tablet by mouth daily. , Disp: , Rfl:  .  glucose blood test strip, Use to check blood sugars daily 3 times daily  E11.65, Disp: 200 each, Rfl: 3 .  lamoTRIgine (LAMICTAL) 150 MG tablet, Take 1 tablet (150 mg total) by mouth 2 (two) times daily., Disp: 180 tablet, Rfl: 1 .  LORazepam (ATIVAN) 1 MG tablet, TAKE 1 TABLET BY MOUTH THREE TIMES DAILY AS NEEDED FOR ANXIETY. GENERIC EQUIVALENT FOR ATIVAN., Disp: 270 tablet, Rfl: 1 .  losartan (COZAAR) 100 MG tablet, Take 1 tablet (100 mg total) by mouth daily., Disp: 90 tablet, Rfl: 3 .  metFORMIN (GLUCOPHAGE) 1000 MG tablet, TAKE 1 TABLET BY MOUTH 2 TIMES DAILY WITH MEALS, Disp: 180 tablet, Rfl: 1 .  metoprolol tartrate (LOPRESSOR) 25 MG tablet, TAKE 1 TABLET BY MOUTH 2 TIMES DAILY. GENERIC EQUIVALENT FOR LOPRESSOR., Disp: 180 tablet, Rfl: 3 .  Multiple Vitamin (MULTIVITAMIN WITH MINERALS) TABS tablet, Take 1 tablet by mouth daily., Disp: , Rfl:  .  Omega-3 Fatty Acids (FISH OIL) 1000 MG CAPS, Take 1 capsule by mouth daily., Disp: , Rfl:  .  omeprazole (PRILOSEC) 40 MG capsule, TAKE 1 CAPSULE BY MOUTH 20 MINUTES BEFORE BREAKFAST., Disp: 90 capsule, Rfl: 0 .  Semaglutide (RYBELSUS) 14 MG TABS, Take 14 mg by mouth daily at 6 (six) AM., Disp: 30 tablet, Rfl: 0 .  vitamin B-12 (CYANOCOBALAMIN) 500 MCG tablet, Take 500 mcg by mouth daily., Disp: , Rfl:  .  insulin degludec (TRESIBA FLEXTOUCH) 100 UNIT/ML FlexTouch Pen, Inject 50 Units into the skin daily., Disp: 15 mL, Rfl: 0 .  Insulin Pen Needle (PEN NEEDLES) 32G X 4 MM MISC, Use as directed, Disp: 35 each, Rfl: 0   No Known Allergies   Review of Systems  Constitutional: Negative.  Negative for fatigue.  HENT: Negative.   Respiratory: Negative.    Cardiovascular: Negative for chest pain, palpitations and leg swelling.  Endocrine: Negative for polydipsia, polyphagia and polyuria.  Musculoskeletal: Negative.   Skin: Negative.   Neurological: Negative for dizziness and headaches.  Psychiatric/Behavioral: Negative.      Today's Vitals   06/30/20 1125  BP: 116/72  Pulse: 94  Temp: 97.9 F (36.6 C)  TempSrc: Oral  Weight: 232 lb (105.2 kg)  Height: _0  (1.753 m)   Body mass index is 34.26 kg/m.  Wt Readings from Last 3 Encounters:  06/30/20 232 lb (105.2 kg)  03/26/20 228 lb (103.4 kg)  03/26/20 228 lb (103.4 kg)   Objective:  Physical Exam Vitals reviewed.  Constitutional:      General: He is not in acute  distress.    Appearance: Normal appearance. He is obese.  Eyes:     Extraocular Movements: Extraocular movements intact.     Conjunctiva/sclera: Conjunctivae normal.     Pupils: Pupils are equal, round, and reactive to light.  Cardiovascular:     Rate and Rhythm: Normal rate and regular rhythm.     Pulses: Normal pulses.     Heart sounds: Normal heart sounds. No murmur heard.   Pulmonary:     Effort: Pulmonary effort is normal. No respiratory distress.     Breath sounds: Normal breath sounds. No wheezing.  Skin:    General: Skin is warm and dry.     Capillary Refill: Capillary refill takes less than 2 seconds.  Neurological:     General: No focal deficit present.     Mental Status: He is alert and oriented to person, place, and time.     Cranial Nerves: No cranial nerve deficit.  Psychiatric:        Mood and Affect: Mood normal.        Behavior: Behavior normal.        Thought Content: Thought content normal.        Judgment: Judgment normal.         Assessment And Plan:     1. Type 2 diabetes mellitus without complication, without long-term current use of insulin (HCC)  Chronic, improving since his last visit  Continue with current medications  Encouraged to limit intake of sugary foods and  drinks  Encouraged to increase physical activity to 150 minutes per week as tolerated - CMP14+EGFR - Hemoglobin A1c  2. Essential hypertension  Chronic, well controlled  Continue current medications  3. Atherosclerosis of aorta (HCC)  Chronic, continue with statin  4. Mixed hyperlipidemia  Chronic, continue with medications.  Encouraged to continue with avoiding fried and fatty foods. - Lipid panel  5. Other long term (current) drug therapy - TSH   He is encouraged to initially strive for BMI less than 30 to decrease cardiac risk. He is advised to exercise no less than 150 minutes per week.   Patient was given opportunity to ask questions. Patient verbalized understanding of the plan and was able to repeat key elements of the plan. All questions were answered to their satisfaction.  [name], FNP   I, [name], FNP, have reviewed all documentation for this visit. The documentation on 06/30/20 for the exam, diagnosis, procedures, and orders are all accurate and complete.   THE PATIENT IS ENCOURAGED TO PRACTICE SOCIAL DISTANCING DUE TO THE COVID-19 PANDEMIC.

## 2020-06-30 NOTE — Patient Instructions (Signed)
Diabetes Mellitus and Exercise Exercising regularly is important for overall health, especially for people who have diabetes mellitus. Exercising is not only about losing weight. It has many other health benefits, such as increasing muscle strength and bone density and reducing body fat and stress. This leads to improved fitness, flexibility, and endurance, all of which result in better overall health. What are the benefits of exercise if I have diabetes? Exercise has many benefits for people with diabetes. They include:  Helping to lower and control blood sugar (glucose).  Helping the body to respond better to the hormone insulin by improving insulin sensitivity.  Reducing how much insulin the body needs.  Lowering the risk for heart disease by: ? Lowering "bad" cholesterol and triglyceride levels. ? Increasing "good" cholesterol levels. ? Lowering blood pressure. ? Lowering blood glucose levels. What is my activity plan? Your health care provider or certified diabetes educator can help you make a plan for the type and frequency of exercise that works for you. This is called your activity plan. Be sure to:  Get at least 150 minutes of medium-intensity or high-intensity exercise each week. Exercises may include brisk walking, biking, or water aerobics.  Do stretching and strengthening exercises, such as yoga or weight lifting, at least 2 times a week.  Spread out your activity over at least 3 days of the week.  Get some form of physical activity each day. ? Do not go more than 2 days in a row without some kind of physical activity. ? Avoid being inactive for more than 90 minutes at a time. Take frequent breaks to walk or stretch.  Choose exercises or activities that you enjoy. Set realistic goals.  Start slowly and gradually increase your exercise intensity over time.   How do I manage my diabetes during exercise? Monitor your blood glucose  Check your blood glucose before and  after exercising. If your blood glucose is: ? 240 mg/dL (13.3 mmol/L) or higher before you exercise, check your urine for ketones. These are chemicals created by the liver. If you have ketones in your urine, do not exercise until your blood glucose returns to normal. ? 100 mg/dL (5.6 mmol/L) or lower, eat a snack containing 15-20 grams of carbohydrate. Check your blood glucose 15 minutes after the snack to make sure that your glucose level is above 100 mg/dL (5.6 mmol/L) before you start your exercise.  Know the symptoms of low blood glucose (hypoglycemia) and how to treat it. Your risk for hypoglycemia increases during and after exercise. Follow these tips and your health care provider's instructions  Keep a carbohydrate snack that is fast-acting for use before, during, and after exercise to help prevent or treat hypoglycemia.  Avoid injecting insulin into areas of the body that are going to be exercised. For example, avoid injecting insulin into: ? Your arms, when you are about to play tennis. ? Your legs, when you are about to go jogging.  Keep records of your exercise habits. Doing this can help you and your health care provider adjust your diabetes management plan as needed. Write down: ? Food that you eat before and after you exercise. ? Blood glucose levels before and after you exercise. ? The type and amount of exercise you have done.  Work with your health care provider when you start a new exercise or activity. He or she may need to: ? Make sure that the activity is safe for you. ? Adjust your insulin, other medicines, and food that   you eat.  Drink plenty of water while you exercise. This prevents loss of water (dehydration) and problems caused by a lot of heat in the body (heat stroke).   Where to find more information  American Diabetes Association: www.diabetes.org Summary  Exercising regularly is important for overall health, especially for people who have diabetes  mellitus.  Exercising has many health benefits. It increases muscle strength and bone density and reduces body fat and stress. It also lowers and controls blood glucose.  Your health care provider or certified diabetes educator can help you make an activity plan for the type and frequency of exercise that works for you.  Work with your health care provider to make sure any new activity is safe for you. Also work with your health care provider to adjust your insulin, other medicines, and the food you eat. This information is not intended to replace advice given to you by your health care provider. Make sure you discuss any questions you have with your health care provider. Document Revised: 01/15/2019 Document Reviewed: 01/15/2019 Elsevier Patient Education  2021 [employer]  

## 2020-07-01 ENCOUNTER — Ambulatory Visit (INDEPENDENT_AMBULATORY_CARE_PROVIDER_SITE_OTHER): Payer: Medicare Other

## 2020-07-01 ENCOUNTER — Telehealth: Payer: Medicare Other

## 2020-07-01 DIAGNOSIS — E119 Type 2 diabetes mellitus without complications: Secondary | ICD-10-CM

## 2020-07-01 DIAGNOSIS — N1831 Chronic kidney disease, stage 3a: Secondary | ICD-10-CM | POA: Diagnosis not present

## 2020-07-01 DIAGNOSIS — E782 Mixed hyperlipidemia: Secondary | ICD-10-CM

## 2020-07-01 DIAGNOSIS — I10 Essential (primary) hypertension: Secondary | ICD-10-CM | POA: Diagnosis not present

## 2020-07-01 LAB — CMP14+EGFR
ALT: 78 IU/L — ABNORMAL HIGH (ref 0–44)
AST: 51 IU/L — ABNORMAL HIGH (ref 0–40)
Albumin/Globulin Ratio: 2 (ref 1.2–2.2)
Albumin: 5 g/dL — ABNORMAL HIGH (ref 3.8–4.8)
Alkaline Phosphatase: 186 IU/L — ABNORMAL HIGH (ref 44–121)
BUN/Creatinine Ratio: 17 (ref 10–24)
BUN: 26 mg/dL (ref 8–27)
Bilirubin Total: 0.4 mg/dL (ref 0.0–1.2)
CO2: 17 mmol/L — ABNORMAL LOW (ref 20–29)
Calcium: 9.8 mg/dL (ref 8.6–10.2)
Chloride: 102 mmol/L (ref 96–106)
Creatinine, Ser: 1.5 mg/dL — ABNORMAL HIGH (ref 0.76–1.27)
Globulin, Total: 2.5 g/dL (ref 1.5–4.5)
Glucose: 162 mg/dL — ABNORMAL HIGH (ref 65–99)
Potassium: 5 mmol/L (ref 3.5–5.2)
Sodium: 140 mmol/L (ref 134–144)
Total Protein: 7.5 g/dL (ref 6.0–8.5)
eGFR: 51 mL/min/{1.73_m2} — ABNORMAL LOW (ref >59)

## 2020-07-01 LAB — TSH: TSH: 1.65 u[IU]/mL (ref 0.450–4.500)

## 2020-07-01 LAB — HEMOGLOBIN A1C
Est. average glucose Bld gHb Est-mCnc: 151 mg/dL
Hgb A1c MFr Bld: 6.9 % — ABNORMAL HIGH (ref 4.8–5.6)

## 2020-07-01 LAB — LIPID PANEL
Chol/HDL Ratio: 5.5 ratio — ABNORMAL HIGH (ref 0.0–5.0)
Cholesterol, Total: 191 mg/dL (ref 100–199)
HDL: 35 mg/dL — ABNORMAL LOW (ref >39)
LDL Chol Calc (NIH): 94 mg/dL (ref 0–99)
Triglycerides: 375 mg/dL — ABNORMAL HIGH (ref 0–149)
VLDL Cholesterol Cal: 62 mg/dL — ABNORMAL HIGH (ref 5–40)

## 2020-07-02 NOTE — Progress Notes (Signed)
This encounter was created in error - please disregard.

## 2020-07-03 ENCOUNTER — Other Ambulatory Visit: Payer: Self-pay | Admitting: Nurse Practitioner

## 2020-07-03 ENCOUNTER — Ambulatory Visit: Payer: Self-pay

## 2020-07-03 ENCOUNTER — Other Ambulatory Visit: Payer: Self-pay

## 2020-07-03 DIAGNOSIS — E119 Type 2 diabetes mellitus without complications: Secondary | ICD-10-CM

## 2020-07-03 DIAGNOSIS — I10 Essential (primary) hypertension: Secondary | ICD-10-CM

## 2020-07-03 DIAGNOSIS — E781 Pure hyperglyceridemia: Secondary | ICD-10-CM

## 2020-07-03 DIAGNOSIS — M542 Cervicalgia: Secondary | ICD-10-CM | POA: Diagnosis not present

## 2020-07-03 DIAGNOSIS — E782 Mixed hyperlipidemia: Secondary | ICD-10-CM

## 2020-07-03 MED ORDER — PEN NEEDLES 32G X 4 MM MISC: 0 refills | Status: DC

## 2020-07-03 NOTE — Progress Notes (Signed)
Chronic Care Management Pharmacy Note  07/08/2020 Name:  Robert Walters MRN:  [id number] DOB:  22-Aug-1955  Subjective: Robert Walters is an 65 y.o. year old male who is a primary patient of [name], [place].  The CCM team was consulted for assistance with disease management and care coordination needs.  Mpther and father passed and he stopped walking his mother passed 7 years ago, and his father passed on 01-17-19. He has a brother that lives in [place], [place].   Engaged with patient by telephone for follow up visit in response to provider referral for pharmacy case management and/or care coordination services.    Consent to Services:  The patient was given information about Chronic Care Management services, agreed to services, and gave verbal consent prior to initiation of services.  Please see initial visit note for detailed documentation.   Patient Care Team: [name], FNP as PCP - General (General Practice) Nahser, Wonda Cheng, MD as PCP - Cardiology (Cardiology) Rex Kras Claudette Stapler, RN as Case Manager [place], Kennieth Francois, [facility] (Inactive) (Pharmacist)  Recent office visits:  06/30/2020 PCP OV  Recent consult visits: 06/10/2020 [place] visit  03/21/2020: Elevated triglycerides   Hospital visits: None in previous 6 months  Objective:  Lab Results  Component Value Date   CREATININE 1.50 (H) 06/30/2020   BUN 26 06/30/2020   GFRNONAA 50 (L) 03/26/2020   GFRAA 58 (L) 03/26/2020   NA 140 06/30/2020   K 5.0 06/30/2020   CALCIUM 9.8 06/30/2020   CO2 17 (L) 06/30/2020    Lab Results  Component Value Date/Time   HGBA1C 6.9 (H) 06/30/2020 03:50 PM   HGBA1C 7.3 (H) 03/26/2020 11:09 AM   MICROALBUR 10 03/26/2020 01:16 PM   MICROALBUR 30 11/06/2019 04:23 PM    Last diabetic Eye exam:  Lab Results  Component Value Date/Time   HMDIABEYEEXA No Retinopathy 01/02/2020 12:00 AM    Last diabetic Foot exam: No results found for: HMDIABFOOTEX   Lab Results   Component Value Date   CHOL 191 06/30/2020   HDL 35 (L) 06/30/2020   LDLCALC 94 06/30/2020   LDLDIRECT 124.3 11/10/2010   TRIG 375 (H) 06/30/2020   CHOLHDL 5.5 (H) 06/30/2020    Hepatic Function Latest Ref Rng & Units 06/30/2020 03/26/2020 11/06/2019  Total Protein 6.0 - 8.5 g/dL 7.5 6.8 6.9  Albumin 3.8 - 4.8 g/dL 5.0(H) 4.4 4.4  AST 0 - 40 IU/L 51(H) 50(H) 36  ALT 0 - 44 IU/L 78(H) 66(H) 47(H)  Alk Phosphatase 44 - 121 IU/L 186(H) 191(H) 167(H)  Total Bilirubin 0.0 - 1.2 mg/dL 0.4 0.6 0.5  Bilirubin, Direct 0.0 - 0.2 mg/dL - - -    Lab Results  Component Value Date/Time   TSH 1.650 06/30/2020 03:50 PM   TSH 2.700 11/29/2018 10:46 AM   FREET4 1.69 03/24/2009 01:00 PM    CBC Latest Ref Rng & Units 03/26/2020 11/29/2018 09/01/2016  WBC 3.4 - 10.8 x10E3/uL 6.9 7.0 11.4(H)  Hemoglobin 13.0 - 17.7 g/dL 13.5 12.8(L) 12.1(L)  Hematocrit 37.5 - 51.0 % 40.2 37.6 36.0(L)  Platelets 150 - 450 x10E3/uL 178 161 315    No results found for: VD25OH  Clinical ASCVD: Yes  The ASCVD Risk score Mikey Bussing DC Jr., et al., 2013) failed to calculate for the following reasons:   The patient has a prior MI or stroke diagnosis    Depression screen [facility] 2/9 06/30/2020 03/26/2020 11/06/2019  Decreased Interest 0 0 0  Down, Depressed, Hopeless  0 0 0  PHQ - 2 Score 0 0 0  Altered sleeping 0 0 0  Tired, decreased energy 1 0 0  Change in appetite 0 0 0  Feeling bad or failure about yourself  0 0 0  Trouble concentrating 0 0 0  Moving slowly or fidgety/restless 0 0 0  Suicidal thoughts 0 0 0  PHQ-9 Score 1 0 0  Difficult doing work/chores Not difficult at all - -  Some recent data might be hidden       Social History   Tobacco Use  Smoking Status Never Smoker  Smokeless Tobacco Never Used   BP Readings from Last 3 Encounters:  06/30/20 116/72  03/26/20 124/86  03/26/20 124/86   Pulse Readings from Last 3 Encounters:  06/30/20 94  03/26/20 76  03/26/20 76   Wt Readings from Last 3  Encounters:  06/30/20 232 lb (105.2 kg)  03/26/20 228 lb (103.4 kg)  03/26/20 228 lb (103.4 kg)    Assessment/Interventions: Review of patient past medical history, allergies, medications, health status, including review of consultants reports, laboratory and other test data, was performed as part of comprehensive evaluation and provision of chronic care management services.   SDOH:  (Social Determinants of Health) assessments and interventions performed: Yes   CCM Care Plan  No Known Allergies  Medications Reviewed Today    Reviewed by Lynne Logan, RN (Registered Nurse) on 07/01/20 at 1617  Med List Status: <None>  Medication Order Taking? Sig Documenting Provider Last Dose Status Informant  alfuzosin (UROXATRAL) 10 MG 24 hr tablet [id number] No Take 1 tablet (10 mg total) by mouth daily. Franchot Gallo, MD Taking Active   Ascorbic Acid (VITAMIN C) 500 MG CAPS [id number] No Take 500 mg by mouth daily.  [provider] Taking Active   aspirin EC 81 MG tablet [id number] No Take 1 tablet (81 mg total) by mouth daily. Nahser, Wonda Cheng, MD Taking Active   atorvastatin (LIPITOR) 40 MG tablet [id number] No TAKE 1 TABLET BY MOUTH AT BEDTIME [name], FNP Taking Active   b complex vitamins tablet [id number] No Take 1 tablet by mouth daily.  [provider] Taking Active   cholecalciferol (VITAMIN D3) 25 MCG (1000 UNIT) tablet [id number] No Take 2 each by mouth daily.  [provider] Taking Active Self  CINNAMON PO [id number] No Take 1 tablet by mouth daily. 1055m daily [provider] Taking Active Self  Coenzyme Q10 (COQ10) 200 MG CAPS [id number] Take 1 tablet by mouth daily.  [provider] Taking Active Self  empagliflozin (JARDIANCE) 25 MG TABS tablet [id number] Take 1 tablet (25 mg total) by mouth daily before breakfast. MMinette Brine FNP Taking Active   FLUoxetine (PROZAC) 40 MG capsule [id number] Take 1 capsule (40 mg total) by  mouth every morning. HAlen BlewTaking Active   Garlic 11829MG CAPS [id number] Take 1 tablet by mouth daily.  [provider] Taking Active   glucose blood test strip [id number] Use to check blood sugars daily 3 times daily  E11.65 MMinette Brine FNP Taking Active   insulin degludec (TRESIBA FLEXTOUCH) 100 UNIT/ML FlexTouch Pen [id number] Inject 50 Units into the skin daily. MMinette Brine FNP Taking Active   Insulin Pen Needle (PEN NEEDLES) 32G X 4 MM MISC [id number] Use as directed MMinette Brine FNP Taking Active   lamoTRIgine (LAMICTAL) 150 MG tablet [id number] Take 1 tablet (150 mg total) by mouth 2 (  two) times daily. Addison Lank, PA-C Taking Active   LORazepam (ATIVAN) 1 MG tablet [id number] No TAKE 1 TABLET BY MOUTH THREE TIMES DAILY AS NEEDED FOR ANXIETY. GENERIC EQUIVALENT FOR ATIVAN. Addison Lank, PA-C Taking Active   losartan (COZAAR) 100 MG tablet [id number] No Take 1 tablet (100 mg total) by mouth daily. Nahser, Wonda Cheng, MD Taking Active   metFORMIN (GLUCOPHAGE) 1000 MG tablet [id number] No TAKE 1 TABLET BY MOUTH 2 TIMES DAILY WITH MEALS [name], FNP Taking Active   metoprolol tartrate (LOPRESSOR) 25 MG tablet [id number] No TAKE 1 TABLET BY MOUTH 2 TIMES DAILY. GENERIC EQUIVALENT FOR LOPRESSOR. Nahser, Wonda Cheng, MD Taking Active   Multiple Vitamin (MULTIVITAMIN WITH MINERALS) TABS tablet [id number] No Take 1 tablet by mouth daily. [provider] Taking Active Self  Omega-3 Fatty Acids (FISH OIL) 1000 MG CAPS [id number] No Take 1 capsule by mouth daily. [provider] Taking Active   omeprazole (PRILOSEC) 40 MG capsule [id number] No TAKE 1 CAPSULE BY MOUTH 20 MINUTES BEFORE BREAKFAST. Montez Morita, Quillian Quince, MD Taking Active   Semaglutide [facility]) 14 MG TABS [id number] No Take 14 mg by mouth daily at 6 (six) AM. [name], FNP Taking Active   vitamin B-12 (CYANOCOBALAMIN) 500 MCG tablet [id number] No Take 500 mcg by mouth  daily. [provider] Taking Active           Patient Active Problem List   Diagnosis Date Noted  . Chronic diastolic heart failure ([place]) 03/27/2019  . Fatty liver 12/26/2018  . Elevated LFTs 12/26/2018  . History of colonic polyps 12/26/2018  . Fall 08/28/2018  . Rib pain on left side 08/28/2018  . Anxiety 03/31/2018  . Bipolar II disorder ([place]) 03/31/2018  . Insomnia 03/31/2018  . Type 2 diabetes mellitus without complication, without long-term current use of insulin ([place]) 03/24/2018  . Bile leak 09/15/2016  . Sepsis ([place]) 08/23/2016  . Cholecystitis   . Stroke ([place]) 07/25/2015  . 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 Dizziness 08/09/2014  . TIA (transient ischemic attack) 05/15/2012  . Blurred vision 05/15/2012  . Ataxia 05/15/2012  . Diabetes mellitus type 2 with complications ([place]) 89/21/1943  . BPH (benign prostatic hyperplasia) 05/15/2012  . GERD (gastroesophageal reflux disease) 05/15/2012  . Obesity 05/15/2012  . Bipolar 1 disorder ([place]) 05/15/2012  . Essential hypertension 10/30/2010  . Cardiovascular risk factor 10/30/2010  . Pure hypercholesterolemia 12/05/2008    Immunization History  Administered Date(s) Administered  . DTaP 10/05/2011  . Influenza, High Dose Seasonal PF 01/15/2019  . Influenza-Unspecified 03/03/2012, 03/16/2018, 03/21/2020  . Moderna Sars-Covid-2 Vaccination 12/07/2019, 01/04/2020  . Pneumococcal Conjugate-13 03/21/2019  . Pneumococcal Polysaccharide-23 05/16/2012  . Zoster Recombinat (Shingrix) 11/19/2019    Conditions to be addressed/monitored:  Hypertension, Hyperlipidemia, Diabetes and Depression   Pharmacist Clinical Goal(s):  [name] Over the next 90 days, patient will verbalize ability to afford treatment regimen through collaboration with PharmD and provider.    Interventions: . 1:1 collaboration with [name], FNP regarding development and update of comprehensive plan of care as evidenced by provider  attestation and co-signature . Inter-disciplinary care team collaboration (see longitudinal plan of care) . Comprehensive medication review performed; medication list updated in electronic medical record  Hypertension (BP goal <130/80) -Controlled -Current treatment: . Losartan 100 mg taking 1 tablet by mouth daily . Lopressor 25 mg take 1 tablet by mouth two time daily  -Current home readings: none at this time -Current dietary habits: patient reports that he  is trying to eat a bit more healthy.  -Current exercise habits: patient is not exercising as often, he reports that he walks [place] sometimes.  -Denies hypotensive/hypertensive symptoms -Educated on BP goals and benefits of medications for prevention of heart attack, stroke and kidney damage; Daily salt intake goal < 2300 mg; Proper BP monitoring technique; -Counseled to monitor BP at home 2-3 times per week, document, and provide log at future appointments -Counseled on diet and exercise extensively Recommended to continue current medication  Hyperlipidemia: (LDL goal < 70) -Not ideally controlled -Current treatment: . Atorvastatin 40 mg tablet once a day at bedtime  . Aspirin 81 mg tablet daily  -Current exercise habits: use to exercise but is not active anymore  -Educated on Cholesterol goals;  Benefits of statin for ASCVD risk reduction; Importance of limiting foods high in cholesterol; Exercise goal of 150 minutes per week; -Counseled on diet and exercise extensively Recommended to continue current medication  Diabetes (A1c goal <7%) -Controlled -Current medications: . Tresiba 100 unit/mL - Inject 50 units into the skin daily . Rybelsus 14 mg taking 1 tablet by mouth daily  . Jardiance 25 mg taking 1 tablet by mouth daily   -Current home glucose readings . fasting glucose: 135-150, 202 some mornings  -Denies hypoglycemic/hyperglycemic symptoms -Current meal patterns:  . snacks: a bowl of cereal at night, with  whole milk . drinks: whole milk 2-3 times per day, he does not like any other type of milk.  -Educated onA1c and blood sugar goals; Complications of diabetes including kidney damage, retinal damage, and cardiovascular disease; Carbohydrate counting and/or plate method -Counseled to check feet daily and get yearly eye exams -Recommended to continue current medication  Depression -Controlled -Current treatment: . Lorazepam 1 mg take 1 tablet by mouth three times per day as needed for anxiety.  . Fluoxetine 40 mg take 1 capsule by mouth every morning -Educated on Benefits of medication for symptom control -Recommended to continue current medication   Health Maintenance -Current therapy:  . Garlic 2831 mg take 1 tablet by mouth daily  . Coenzyme Q10 200 mg take 1 capsule by mouth daily . Omega 3 - FA's 1000 mg take 1 capsule by mouth daily  . Vitamin B-12 500 mcg- take 1 tablet by mouth daily . Cinnamon PO  - take 1 tablet by mouth daily . Vitamin D3 1000 unit - take 1 tablet by mouth daily . B - complex Vitamin - take 1 tablet by mouth daily  . [employer]  - once a day  -Educated on Herbal supplement research is limited and benefits usually cannot be proven Cost vs benefit of each product must be carefully weighed by individual consumer Supplements may interfere with prescription drugs  -Patient is satisfied with current therapy and denies issues -Counseled on how herbal supplements can intererfere with medications. Recommended patient limit the amount of extra vitamins and supplements that he takes.  Collaborated with patient to stop taking Cinnamon and Garlic.    Patient Goals/Self-Care Activities . Over the next 90 days, patient will:  - take medications as prescribed  Follow Up Plan: Telephone follow up appointment with care management team member scheduled for: 10/07/2020  Medication Assistance: Rybelsus, [name] obtained through [employer] medication assistance  program.  Enrollment ends November 30th, 2022.     Patient's preferred pharmacy is:  [facility] [place], [address] [place] [address]  Phone: [phone] Fax: [place], [place] [address] Phone: [phone] Fax: [phone]  [place], [place] - [address] [place] [address] [place] [address] Phone: [phone] Fax: [phone]  [id number] [place]: [phone]: [phone] Uses pill box? Yes Pt endorses 90% compliance  We discussed: Benefits of medication synchronization, packaging and delivery as well as enhanced pharmacist oversight with [employer]. Patient decided to: Continue current medication management strategy  Care Plan and Follow Up Patient Decision:  Patient agrees to Care Plan and Follow-up.  Plan: Telephone follow up appointment with care management team member scheduled for:  11/05/2020, The patient has been provided with contact information for the care management team and has been advised to call with any health related questions or concerns.  and Next AWV (Annual Wellness Visit) scheduled for: 04/01/2021  VOrlando Penner PharmD Clinical Pharmacist [facility] [phone]

## 2020-07-04 ENCOUNTER — Other Ambulatory Visit: Payer: Self-pay

## 2020-07-04 DIAGNOSIS — E119 Type 2 diabetes mellitus without complications: Secondary | ICD-10-CM

## 2020-07-04 MED ORDER — TRESIBA FLEXTOUCH 100 UNIT/ML ~~LOC~~ SOPN: 50.0000 [IU] | PEN_INJECTOR | Freq: Every day | SUBCUTANEOUS | 0 refills | Status: DC

## 2020-07-04 NOTE — Chronic Care Management (AMB) (Signed)
Chronic Care Management   CCM RN Visit Note  07/01/2020 Name: Robert Walters MRN: [id number] DOB: [birth year]  Subjective: Robert Walters is a 65 y.o. year old male who is a primary care patient of [name], [place]. The care management team was consulted for assistance with disease management and care coordination needs.    Engaged with patient by telephone for follow up visit in response to provider referral for case management and/or care coordination services.   Consent to Services:  The patient was given information about Chronic Care Management services, agreed to services, and gave verbal consent prior to initiation of services.  Please see initial visit note for detailed documentation.   Patient agreed to services and verbal consent obtained.   Assessment: Review of patient past medical history, allergies, medications, health status, including review of consultants reports, laboratory and other test data, was performed as part of comprehensive evaluation and provision of chronic care management services.   SDOH (Social Determinants of Health) assessments and interventions performed:  Yes, no acute challenges identified   CCM Care Plan  No Known Allergies  Outpatient Encounter Medications as of 07/01/2020  Medication Sig  . alfuzosin (UROXATRAL) 10 MG 24 hr tablet Take 1 tablet (10 mg total) by mouth daily.  . Ascorbic Acid (VITAMIN C) 500 MG CAPS Take 500 mg by mouth daily.   [name] aspirin EC 81 MG tablet Take 1 tablet (81 mg total) by mouth daily.  [name] atorvastatin (LIPITOR) 40 MG tablet TAKE 1 TABLET BY MOUTH AT BEDTIME  . b complex vitamins tablet Take 1 tablet by mouth daily.   . cholecalciferol (VITAMIN D3) 25 MCG (1000 UNIT) tablet Take 2 each by mouth daily.   [name] CINNAMON PO Take 1 tablet by mouth daily. 1000mg  daily  . Coenzyme Q10 (COQ10) 200 MG CAPS Take 1 tablet by mouth daily.   . empagliflozin (JARDIANCE) 25 MG TABS tablet Take 1 tablet (25 mg total) by mouth daily before  breakfast. (Patient taking differently: Take 25 mg by mouth every other day.)  . FLUoxetine (PROZAC) 40 MG capsule Take 1 capsule (40 mg total) by mouth every morning.  . Garlic 0160 MG CAPS Take 1 tablet by mouth daily.   [name] glucose blood test strip Use to check blood sugars daily 3 times daily  E11.65  . lamoTRIgine (LAMICTAL) 150 MG tablet Take 1 tablet (150 mg total) by mouth 2 (two) times daily.  [name] LORazepam (ATIVAN) 1 MG tablet TAKE 1 TABLET BY MOUTH THREE TIMES DAILY AS NEEDED FOR ANXIETY. GENERIC EQUIVALENT FOR ATIVAN.  [name] losartan (COZAAR) 100 MG tablet Take 1 tablet (100 mg total) by mouth daily.  . metFORMIN (GLUCOPHAGE) 1000 MG tablet TAKE 1 TABLET BY MOUTH 2 TIMES DAILY WITH MEALS  . metoprolol tartrate (LOPRESSOR) 25 MG tablet TAKE 1 TABLET BY MOUTH 2 TIMES DAILY. GENERIC EQUIVALENT FOR LOPRESSOR.  . Multiple Vitamin (MULTIVITAMIN WITH MINERALS) TABS tablet Take 1 tablet by mouth daily.  . Omega-3 Fatty Acids (FISH OIL) 1000 MG CAPS Take 1 capsule by mouth daily.  [name] omeprazole (PRILOSEC) 40 MG capsule TAKE 1 CAPSULE BY MOUTH 20 MINUTES BEFORE BREAKFAST.  [name] Semaglutide (RYBELSUS) 14 MG TABS Take 14 mg by mouth daily at 6 (six) AM.  . vitamin B-12 (CYANOCOBALAMIN) 500 MCG tablet Take 500 mcg by mouth daily.  . [DISCONTINUED] insulin degludec (TRESIBA FLEXTOUCH) 100 UNIT/ML FlexTouch Pen Inject 50 Units into the skin daily.  . [DISCONTINUED] Insulin Pen Needle (PEN NEEDLES) 32G X  4 MM MISC Use as directed   No facility-administered encounter medications on file as of 07/01/2020.    Patient Active Problem List   Diagnosis Date Noted  . Chronic diastolic heart failure ([place]) 03/27/2019  . Fatty liver 12/26/2018  . Elevated LFTs 12/26/2018  . History of colonic polyps 12/26/2018  . Fall 08/28/2018  . Rib pain on left side 08/28/2018  . Anxiety 03/31/2018  . Bipolar II disorder ([place]) 03/31/2018  . Insomnia 03/31/2018  . Type 2 diabetes mellitus without complication, without long-term  current use of insulin ([place]) 03/24/2018  . Bile leak 09/15/2016  . Sepsis ([place]) 08/23/2016  . Cholecystitis   . Stroke ([place]) 07/25/2015  . 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 Dizziness 08/09/2014  . TIA (transient ischemic attack) 05/15/2012  . Blurred vision 05/15/2012  . Ataxia 05/15/2012  . Diabetes mellitus type 2 with complications ([place]) 17/00/1749  . BPH (benign prostatic hyperplasia) 05/15/2012  . GERD (gastroesophageal reflux disease) 05/15/2012  . Obesity 05/15/2012  . Bipolar 1 disorder ([place]) 05/15/2012  . Essential hypertension 10/30/2010  . Cardiovascular risk factor 10/30/2010  . Pure hypercholesterolemia 12/05/2008    Conditions to be addressed/monitored:DMII, Essential Hypertension, Stage 3a CKD, Mixed Hyperlipidemia   Care Plan : Chronic Kidney (Adult)  Updates made by Lynne Logan, RN since 07/04/2020 12:00 AM    Problem: Disease Progression   Priority: Medium    Long-Range Goal: Disease Progression Prevented or Minimized   Start Date: 07/01/2020  Expected End Date: 01/01/2021  This Visit's Progress: On track  Priority: Medium  Note:   CARE PLAN ENTRY (see longitudinal plan of care for additional care plan information)  Current Barriers:  [name] Knowledge Deficits related to disease process and Self Health management of CKD . Chronic Disease Management support and education needs related to DMII, Essential Hypertension, Stage 3a CKD, Mixed Hyperlipidemia  Nurse Case Manager Clinical Goal(s):  [name] Over the next 180 days, patient will work with the CCM RN CM and PCP to address needs related to disease education and support to improve CKD as evidence by disease progression will be minimized or avoided  CCM RN CM Interventions:  07/02/19 successful call completed with patient  . Inter-disciplinary care team collaboration (see longitudinal plan of care) . Evaluation of current treatment plan related to CKD and patient's adherence to plan as established by  provider. . Provided education to patient re: kidney decline with recent GFR < 51; Educated patient on stages of kidney disease and interventions to help improve kidney function, including improved diabetic control, increasing water and exercise   . Discussed plans with patient for ongoing care management follow up and provided patient with direct contact information for care management team Patient Self Care Activities:  . Self administer medications as prescribed . Attend all scheduled provider appointments . Call pharmacy for medication refills . Call provider office for new concerns or questions . Increase water intake to 64 oz per day  Next Follow Up Call: 10/02/20   Care Plan : Diabetes Type 2 (Adult)  Updates made by Lynne Logan, RN since 07/04/2020 12:00 AM    Problem: Glycemic Management (Diabetes, Type 2)   Priority: Medium    Long-Range Goal: Glycemic Management Optimized   Start Date: 07/01/2020  Expected End Date: 01/01/2021  This Visit's Progress: On track  Priority: Medium  Note:   CARE PLAN ENTRY (see longitudinal plan of care for additional care plan information)  Current Barriers:  [name] Knowledge Deficits related  to disease process and Self Health management of DM  . Chronic Disease Management support and education needs related to DMII, Essential Hypertension, Stage 3a CKD, Mixed Hyperlipidemia  Nurse Case Manager Clinical Goal(s):  [name] Over the next 180 days, patient will work with CCM RN CM and PCP to address needs related to disease education and support to improve Self Health management of DM as evidence by patient will lower and maintain A1c <7.0 CCM RN CM Interventions:  07/01/20 call completed with patient  . Inter-disciplinary care team collaboration (see longitudinal plan of care) . Evaluation of current treatment plan related to DM and patient's adherence to plan as established by provider. . Reviewed current A1c is down to 6.9; Educated on basic disease process  including complications if uncontrolled; Educated on dietary and exercise recommendations, Provided recommendations for monitoring CBG twice daily before meals and at bedtime and logging BS; Educated on daily glycemic control FBS 80-130; <180 after meals; Educated on importance of implementing exercise to daily routine (150 min weekly), maintaining healthy weight and adhering to diabetic diet: Determined patient is now checking his CBG's at home 1-2 times daily and is logging his readings; Educated on 15'15' rule    . Reviewed medications with patient and discussed indication, dosage and frequency of prescribed medications for diabetes: Determined patient understands his regimen and is adhering, Patient reports having nocturia with urgency and frequency, stating, "I am urinating every 15 minutes during the nighttime since increasing the Jardiance" . Collaborated with PCP [name], FNP regarding patient's c/o of nocturia with increased urgency and frequency since increasing the Jardiance; Determined PCP would like patient to take Jardiance every other day . Provided patient with printed educational materials related to [employer] with Diabetes; Preventing Complications from Diabetes; Diabetes and Kidney Disease  . Mailed printed letter and DM patient educational materials for patient to review and discuss at next RN CM f/u call  . Discussed plans with patient for ongoing care management follow up and provided patient with direct contact information for care management team   Patient Self Care Activities:  . Self administer medications as prescribed (decrease Jardiance to 25 mg every other day per PCP [name] FNP due to frequent urination) . Attend all scheduled provider appointments . Call pharmacy for medication refills . Call provider office for new concerns or questions . Continue to monitor CBG's at home as discussed . Continue to adhere to dietary and exercise recommendations as  discussed  . Review DM patient educational materials to discuss at next RN CM f/u call   Next Follow Up Call: 10/02/20   Care Plan : Mixed Hyperlipidemia  Updates made by Lynne Logan, RN since 07/04/2020 12:00 AM    Problem: Mixed Hyperlipidemia   Priority: High    Long-Range Goal: Mixed Hyperlipidemia - Disease Progression minimized or avoided   Start Date: 07/01/2020  Expected End Date: 01/01/2021  This Visit's Progress: On track  Priority: High  Note:   CARE PLAN ENTRY (see longitudinal plan of care for additional care plan information)  Current Barriers:  [name] Knowledge Deficits related to disease process and Self Health management of Mixed Hyperlipidemia  . Chronic Disease Management support and education needs related to DMII, Essential Hypertension, Stage 3a CKD, Mixed Hyperlipidemia  Nurse Case Manager Clinical Goal(s):  [name] Over the next 180 days, patient will work with the CCM team and PCP to address needs related to disease education and support to improve Self Health management of Mixed Hyperlipidemia  as evidence by patient will lower and maintain Cholesterol levels within normal limits   CCM RN CM Interventions:  07/02/19 call completed with patient  . Inter-disciplinary care team collaboration (see longitudinal plan of care) . Evaluation of current treatment plan related to Mixed Hyperlipidemia and patient's adherence to plan as established by provider. . Provided education to patient re: current lipid panel; educated on dietary and exercise recommendations; educated on disease process and potential complications if left untreated . Reviewed medications with patient and discussed patient reports adherence to taking Atorvastatin 40 mg q hs  . Collaborated with PCP [name], FNP  regarding referral placement for [place] [facility] . Confirmed patient received and reviewed printed educational materials related to 13 Cholesterol Lowering Foods; 12 Triglyceride Lowering Foods;  11 Foods to increase your HDL . Discussed plans with patient for ongoing care management follow up and provided patient with direct contact information for care management team Patient Self Care Activities:  . Self administer medications as prescribed . Attend all scheduled provider appointments . Call pharmacy for medication refills . Adhere to heart healthy diet as discussed  . Follow up with the lipid clinic as recommended by PCP for evaluation of elevated triglycerides   Next Follow Up Date: 10/02/20     Plan:Telephone follow up appointment with care management team member scheduled for:  10/02/20   Barb Merino, RN, BSN, CCM Care Management Coordinator [place] [facility]  Direct Phone: [phone]

## 2020-07-04 NOTE — Patient Instructions (Addendum)
Goals Addressed      Other   .  Chronic Kidney Disease - Disease Progression Prevented or Minimized   On track     Timeframe:  Long-Range Goal Priority:  Medium Start Date:  07/01/20                           Expected End Date:  01/01/21  Next Follow Up Date: 10/02/20  - Self administer medications as prescribed - Attend all scheduled provider appointments - Call pharmacy for medication refills - Call provider office for new concerns or questions - Increase water intake to 64 oz per day                        .  Glycemic Management Optimized   On track     Timeframe:  Long-Range Goal Priority:  Medium Start Date: 07/01/20                            Expected End Date: 01/01/21                      Next Follow Up Date: 10/02/20   - Self administer medications as prescribed  (decrease Jardiance to 25 mg every other day per PCP [name] FNP due to frequent urination) - Attend all scheduled provider appointments - Call pharmacy for medication refills - Call provider office for new concerns or questions - Continue to monitor CBG's at home as discussed - Continue to adhere to dietary and exercise recommendations as discussed  - Review DM patient educational materials to discuss at next RN CM f/u call     .  Mixed Hyperlipidemia - Disease Progression minimized or avoided   On track     Timeframe:  Long-Range Goal Priority:  High Start Date:  07/01/20                           Expected End Date:  01/01/21  Next Follow Up Date: 10/02/20  - Self administer medications as prescribed - Attend all scheduled provider appointments - Call pharmacy for medication refills - Adhere to heart healthy diet as discussed  - Follow up with the lipid clinic as recommended by PCP for evaluation of elevated triglycerides

## 2020-07-07 NOTE — Patient Instructions (Addendum)
Care Management  Note   07/08/2020 Name: Robert Walters MRN: [id number] DOB: 22-Nov-1955  Robert Walters is a 65 y.o. year old male who is a primary care patient of Robert Walters, [place]. The care management team was consulted for assistance with chronic disease management and care coordination needs.     Pharmacist Clinical Goal(s):  [name] Over the next 90 days, patient will verbalize ability to afford treatment regimen through collaboration with PharmD and provider.    Interventions: . 1:1 collaboration with Robert Brine, FNP regarding development and update of comprehensive plan of care as evidenced by provider attestation and co-signature . Inter-disciplinary care team collaboration (see longitudinal plan of care) . Comprehensive medication review performed; medication list updated in electronic medical record  Hypertension (BP goal <130/80) -Controlled -Current treatment: . Losartan 100 mg taking 1 tablet by mouth daily . Lopressor 25 mg take 1 tablet by mouth two time daily .   -Current home readings: none at this time -Current dietary habits: patient reports that he is trying to eat a bit more healthy.  -Current exercise habits: patient is not exercising as often, he [place].  -Denies hypotensive/hypertensive symptoms -Educated on BP goals and benefits of medications for prevention of heart attack, stroke and kidney damage; Daily salt intake goal < 2300 mg; Proper BP monitoring technique; -Counseled to monitor BP at home 2-3 times per week, document, and provide log at future appointments -Counseled on diet and exercise extensively Recommended to continue current medication  Hyperlipidemia: (LDL goal < 70) -Not ideally controlled -Current treatment: . Atorvastatin 40 mg tablet once a day at bedtime  . Aspirin 81 mg tablet daily  -Current exercise habits: use to exercise but is not active anymore  -Educated on Cholesterol goals;  Benefits of  statin for ASCVD risk reduction; Importance of limiting foods high in cholesterol; Exercise goal of 150 minutes per week; -Counseled on diet and exercise extensively Recommended to continue current medication  Diabetes (A1c goal <7%) -Controlled -Current medications: . Tresiba 100 unit/mL - Inject 50 units into the skin daily . Rybelsus 14 mg taking 1 tablet by mouth daily  . Jardiance 25 mg taking 1 tablet by mouth daily   -Current home glucose readings . fasting glucose: 135-150, 202 some mornings  -Denies hypoglycemic/hyperglycemic symptoms -Current meal patterns:  . snacks: a bowl of cereal at night, with whole milk . drinks: whole milk 2-3 times per day, he does not like any other type of milk.  -Educated onA1c and blood sugar goals; Complications of diabetes including kidney damage, retinal damage, and cardiovascular disease; Carbohydrate counting and/or plate method -Counseled to check feet daily and get yearly eye exams -Recommended to continue current medication  Depression -Controlled -Current treatment: . Lorazepam 1 mg take 1 tablet by mouth three times per day as needed for anxiety.  . Fluoxetine 40 mg take 1 capsule by mouth every morning -Educated on Benefits of medication for symptom control -Recommended to continue current medication   Health Maintenance -Current therapy:  . Garlic 4917 mg take 1 tablet by mouth daily  . Coenzyme Q10 200 mg take 1 capsule by mouth daily . Omega 3 - FA's 1000 mg take 1 capsule by mouth daily  . Vitamin B-12 500 mcg- take 1 tablet by mouth daily . Cinnamon PO  - take 1 tablet by mouth daily . Vitamin D3 1000 unit - take 1 tablet by mouth daily . B - complex Vitamin - take 1  tablet by mouth daily  . [employer]  - once a day  -Educated on Herbal supplement research is limited and benefits usually cannot be proven Cost vs benefit of each product must be carefully weighed by individual consumer Supplements may interfere  with prescription drugs  -Patient is satisfied with current therapy and denies issues -Counseled on how herbal supplements can intererfere with medications. Recommended patient limit the amount of extra vitamins and supplements that he takes.  Collaborated with patient to stop taking Cinnamon and Garlic.    Patient Goals/Self-Care Activities . Over the next 90 days, patient will:  - take medications as prescribed  Follow Up Plan: Telephone follow up appointment with care management team member scheduled for: 10/07/2020   [name] was given information about Care Management services today including:  1. CCM service includes personalized support from designated clinical staff supervised by the physician, including individualized plan of care and coordination with other care providers 2. 24/7 contact phone numbers for assistance for urgent and routine care needs. 3. Service will only be billed when office clinical staff spend 20 minutes or more in a month to coordinate care. 4. Only one practitioner may furnish and bill the service in a calendar month. 5. The patient may stop CCM services at amy time (effective at the end of the month) by phone call to the office staff. 6. The patient will be responsible for cost sharing (co-pay) or up to 20% of the service fee (after annual deductible is met)  Patient agreed to services and verbal consent obtained.  Robert Walters, PharmD Clinical Pharmacist [facility] [phone]

## 2020-07-10 DIAGNOSIS — M542 Cervicalgia: Secondary | ICD-10-CM | POA: Diagnosis not present

## 2020-07-17 DIAGNOSIS — M542 Cervicalgia: Secondary | ICD-10-CM | POA: Diagnosis not present

## 2020-07-21 ENCOUNTER — Telehealth: Payer: Medicare Other

## 2020-07-21 ENCOUNTER — Ambulatory Visit: Payer: Self-pay

## 2020-07-21 ENCOUNTER — Other Ambulatory Visit: Payer: Self-pay

## 2020-07-21 DIAGNOSIS — N1831 Chronic kidney disease, stage 3a: Secondary | ICD-10-CM

## 2020-07-21 DIAGNOSIS — E119 Type 2 diabetes mellitus without complications: Secondary | ICD-10-CM | POA: Diagnosis not present

## 2020-07-21 DIAGNOSIS — I10 Essential (primary) hypertension: Secondary | ICD-10-CM

## 2020-07-21 DIAGNOSIS — E782 Mixed hyperlipidemia: Secondary | ICD-10-CM | POA: Diagnosis not present

## 2020-07-21 MED ORDER — RYBELSUS 14 MG PO TABS: 14.0000 mg | ORAL_TABLET | Freq: Every day | ORAL | 0 refills | Status: DC

## 2020-07-23 DIAGNOSIS — M542 Cervicalgia: Secondary | ICD-10-CM | POA: Diagnosis not present

## 2020-07-24 ENCOUNTER — Telehealth: Payer: Self-pay

## 2020-07-24 NOTE — Chronic Care Management (AMB) (Signed)
Chronic Care Management Pharmacy Assistant   Name: BENEDETTO RYDER  MRN: [id number] DOB: November 25, 1955  Reason for Encounter: Patient Assistance Coordination      Medications: Outpatient Encounter Medications as of 07/24/2020  Medication Sig  . alfuzosin (UROXATRAL) 10 MG 24 hr tablet Take 1 tablet (10 mg total) by mouth daily.  . Ascorbic Acid (VITAMIN C) 500 MG CAPS Take 500 mg by mouth daily.   [name] aspirin EC 81 MG tablet Take 1 tablet (81 mg total) by mouth daily.  [name] atorvastatin (LIPITOR) 40 MG tablet TAKE 1 TABLET BY MOUTH AT BEDTIME  . b complex vitamins tablet Take 1 tablet by mouth daily.   . cholecalciferol (VITAMIN D3) 25 MCG (1000 UNIT) tablet Take 2 each by mouth daily.   [name] CINNAMON PO Take 1 tablet by mouth daily. 1000mg  daily  . Coenzyme Q10 (COQ10) 200 MG CAPS Take 1 tablet by mouth daily.   . empagliflozin (JARDIANCE) 25 MG TABS tablet Take 1 tablet (25 mg total) by mouth daily before breakfast. (Patient taking differently: Take 25 mg by mouth every other day.)  . FLUoxetine (PROZAC) 40 MG capsule Take 1 capsule (40 mg total) by mouth every morning.  . Garlic 6283 MG CAPS Take 1 tablet by mouth daily.   [name] glucose blood test strip Use to check blood sugars daily 3 times daily  E11.65  . insulin degludec (TRESIBA FLEXTOUCH) 100 UNIT/ML FlexTouch Pen Inject 50 Units into the skin daily.  . Insulin Pen Needle (PEN NEEDLES) 32G X 4 MM MISC Use as directed  . lamoTRIgine (LAMICTAL) 150 MG tablet Take 1 tablet (150 mg total) by mouth 2 (two) times daily.  [name] LORazepam (ATIVAN) 1 MG tablet TAKE 1 TABLET BY MOUTH THREE TIMES DAILY AS NEEDED FOR ANXIETY. GENERIC EQUIVALENT FOR ATIVAN.  [name] losartan (COZAAR) 100 MG tablet Take 1 tablet (100 mg total) by mouth daily.  . metFORMIN (GLUCOPHAGE) 1000 MG tablet TAKE 1 TABLET BY MOUTH 2 TIMES DAILY WITH MEALS  . metoprolol tartrate (LOPRESSOR) 25 MG tablet TAKE 1 TABLET BY MOUTH 2 TIMES DAILY. GENERIC EQUIVALENT FOR LOPRESSOR.  . Multiple  Vitamin (MULTIVITAMIN WITH MINERALS) TABS tablet Take 1 tablet by mouth daily.  . Omega-3 Fatty Acids (FISH OIL) 1000 MG CAPS Take 1 capsule by mouth daily.  [name] omeprazole (PRILOSEC) 40 MG capsule TAKE 1 CAPSULE BY MOUTH 20 MINUTES BEFORE BREAKFAST.  [name] Semaglutide (RYBELSUS) 14 MG TABS Take 14 mg by mouth daily at 6 (six) AM.  . vitamin B-12 (CYANOCOBALAMIN) 500 MCG tablet Take 500 mcg by mouth daily.   No facility-administered encounter medications on file as of 07/24/2020.    Note: I called and spoke with the patient regarding his Rybelsus medication that wasn't included with his [place] shipment when picked up from [employer] office from [facility]. The patient verbalized he recalls not receiving a shipment of Rybelsus in two months; however, Dr. Laurance Flatten was able to send a prescription to his pharmacy for a 30 DS until the Rybelsus shipment arrives at the office from [employer]. The patient stated there are 27 days left of his Rybelsus medication. I told the patient I would look into this matter. The patient voiced understanding. [name], CPP Notified.  07/28/20-Staff message received Roman Philpot, [place], [place]; reached out to the patient last and notified [name], CPP regarding this matter.  Called and spoke with [employer] regarding the patient Rybelsus medication. Per [employer], the patient's application was placed  on hold until clarification was made from PCP's office to indicate if the patient would be needing 60-Day Supply or 120-Day Supply, both quantity were checked off. I was able to clarify through phone call and informed the representative the patient will be needing 120-day supply. Per Representative; patient should receive shipment within 10 to 12 business days.   07/29/20-Attempted to outreach the patient regarding his Rybelsus, no answer, no voicemail to leave a message.  07/30/20-Patient returned phone call regarding his Rybelsus shipment. Informed  the patient his application was placed on hold until clarification was made from PCP's office to indicate if the patient would be needing 60-Day Supply or 120-Day Supply, both quantity were checked off. I was able to clarify through phone call and informed the representative the patient will be needing 120-day supply. Patient voiced understanding, patient aware shipment will be delivered within 10 to 12 business days.   [name], CPP Notified.  Raynelle Highland, [place] Pharmacist Assistant [phone] CCM Total Time: 76 minutes (Call to [employer]: 40 Minutes).

## 2020-07-28 NOTE — Patient Instructions (Signed)
Goals Addressed    . Glycemic Management Optimized   On track    Timeframe:  Long-Range Goal Priority:  Medium Start Date: 07/01/20                            Expected End Date: 01/01/21                      Next Follow Up Date: 10/02/20   - Self administer medications as prescribed - Attend all scheduled provider appointments - Call pharmacy for medication refills - Call provider office for new concerns or questions - Continue to monitor CBG's at home as discussed - Continue to adhere to dietary and exercise recommendations as discussed  - Review DM patient educational materials to discuss at next RN CM f/u call  - work with embedded Pharm D regarding medication dosing/financial concerns     . Manage My Medicine       Timeframe:  Long-Range Goal Priority:  High Start Date:        07/03/2020                 Expected End Date:                       Follow Up Date: 10/07/2020   - call for medicine refill 2 or 3 days before it runs out - call if I am sick and can't take my medicine - keep a list of all the medicines I take; vitamins and herbals too - use a pillbox to sort medicine - use an alarm clock or phone to remind me to take my medicine    Why is this important?   . These steps will help you keep on track with your medicines.

## 2020-07-28 NOTE — Chronic Care Management (AMB) (Signed)
Chronic Care Management   CCM RN Visit Note  07/21/2020 Name: Robert Walters MRN: [id number] DOB: [birth year]  Subjective: Robert Walters is a 65 y.o. year old male who is a primary care patient of [name], [place]. The care management team was consulted for assistance with disease management and care coordination needs.    Engaged with patient by telephone for follow up visit in response to provider referral for case management and/or care coordination services.   Consent to Services:  The patient was given information about Chronic Care Management services, agreed to services, and gave verbal consent prior to initiation of services.  Please see initial visit note for detailed documentation.   Patient agreed to services and verbal consent obtained.   Assessment: Review of patient past medical history, allergies, medications, health status, including review of consultants reports, laboratory and other test data, was performed as part of comprehensive evaluation and provision of chronic care management services.   SDOH (Social Determinants of Health) assessments and interventions performed:  Yes  CCM Care Plan  No Known Allergies  Outpatient Encounter Medications as of 07/21/2020  Medication Sig  . alfuzosin (UROXATRAL) 10 MG 24 hr tablet Take 1 tablet (10 mg total) by mouth daily.  . Ascorbic Acid (VITAMIN C) 500 MG CAPS Take 500 mg by mouth daily.   [name] aspirin EC 81 MG tablet Take 1 tablet (81 mg total) by mouth daily.  [name] atorvastatin (LIPITOR) 40 MG tablet TAKE 1 TABLET BY MOUTH AT BEDTIME  . b complex vitamins tablet Take 1 tablet by mouth daily.   . cholecalciferol (VITAMIN D3) 25 MCG (1000 UNIT) tablet Take 2 each by mouth daily.   [name] CINNAMON PO Take 1 tablet by mouth daily. 1000mg  daily  . Coenzyme Q10 (COQ10) 200 MG CAPS Take 1 tablet by mouth daily.   . empagliflozin (JARDIANCE) 25 MG TABS tablet Take 1 tablet (25 mg total) by mouth daily before breakfast. (Patient taking  differently: Take 25 mg by mouth every other day.)  . FLUoxetine (PROZAC) 40 MG capsule Take 1 capsule (40 mg total) by mouth every morning.  . Garlic 6834 MG CAPS Take 1 tablet by mouth daily.   [name] glucose blood test strip Use to check blood sugars daily 3 times daily  E11.65  . insulin degludec (TRESIBA FLEXTOUCH) 100 UNIT/ML FlexTouch Pen Inject 50 Units into the skin daily.  . Insulin Pen Needle (PEN NEEDLES) 32G X 4 MM MISC Use as directed  . lamoTRIgine (LAMICTAL) 150 MG tablet Take 1 tablet (150 mg total) by mouth 2 (two) times daily.  [name] LORazepam (ATIVAN) 1 MG tablet TAKE 1 TABLET BY MOUTH THREE TIMES DAILY AS NEEDED FOR ANXIETY. GENERIC EQUIVALENT FOR ATIVAN.  [name] losartan (COZAAR) 100 MG tablet Take 1 tablet (100 mg total) by mouth daily.  . metFORMIN (GLUCOPHAGE) 1000 MG tablet TAKE 1 TABLET BY MOUTH 2 TIMES DAILY WITH MEALS  . metoprolol tartrate (LOPRESSOR) 25 MG tablet TAKE 1 TABLET BY MOUTH 2 TIMES DAILY. GENERIC EQUIVALENT FOR LOPRESSOR.  . Multiple Vitamin (MULTIVITAMIN WITH MINERALS) TABS tablet Take 1 tablet by mouth daily.  . Omega-3 Fatty Acids (FISH OIL) 1000 MG CAPS Take 1 capsule by mouth daily.  [name] omeprazole (PRILOSEC) 40 MG capsule TAKE 1 CAPSULE BY MOUTH 20 MINUTES BEFORE BREAKFAST.  [name] Semaglutide (RYBELSUS) 14 MG TABS Take 14 mg by mouth daily at 6 (six) AM.  . vitamin B-12 (CYANOCOBALAMIN) 500 MCG tablet Take 500 mcg by mouth daily.  No facility-administered encounter medications on file as of 07/21/2020.    Patient Active Problem List   Diagnosis Date Noted  . Chronic diastolic heart failure ([place]) 03/27/2019  . Fatty liver 12/26/2018  . Elevated LFTs 12/26/2018  . History of colonic polyps 12/26/2018  . Fall 08/28/2018  . Rib pain on left side 08/28/2018  . Anxiety 03/31/2018  . Bipolar II disorder ([place]) 03/31/2018  . Insomnia 03/31/2018  . Type 2 diabetes mellitus without complication, without long-term current use of insulin ([place]) 03/24/2018  . Bile leak  09/15/2016  . Sepsis ([place]) 08/23/2016  . Cholecystitis   . Stroke ([place]) 07/25/2015  . 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 Dizziness 08/09/2014  . TIA (transient ischemic attack) 05/15/2012  . Blurred vision 05/15/2012  . Ataxia 05/15/2012  . Diabetes mellitus type 2 with complications ([place]) 56/38/9373  . BPH (benign prostatic hyperplasia) 05/15/2012  . GERD (gastroesophageal reflux disease) 05/15/2012  . Obesity 05/15/2012  . Bipolar 1 disorder ([place]) 05/15/2012  . Essential hypertension 10/30/2010  . Cardiovascular risk factor 10/30/2010  . Pure hypercholesterolemia 12/05/2008    Conditions to be addressed/monitored:DMII, Essential Hypertension, Stage 3a CKD, Mixed Hyperlipidemia   Care Plan : Diabetes Type 2 (Adult)  Updates made by Lynne Logan, RN since 07/28/2020 12:00 AM    Problem: Glycemic Management (Diabetes, Type 2)   Priority: Medium    Long-Range Goal: Glycemic Management Optimized   Start Date: 07/01/2020  Expected End Date: 01/01/2021  Recent Progress: On track  Priority: Medium  Note:   CARE PLAN ENTRY (see longitudinal plan of care for additional care plan information)  Current Barriers:  [name] Knowledge Deficits related to disease process and Self Health management of DM  . Chronic Disease Management support and education needs related to DMII, Essential Hypertension, Stage 3a CKD, Mixed Hyperlipidemia  Nurse Case Manager Clinical Goal(s):  [name] Over the next 180 days, patient will work with CCM RN CM and PCP to address needs related to disease education and support to improve Self Health management of DM as evidence by patient will lower and maintain A1c <7.0 CCM RN CM Interventions:  07/01/20 call completed with patient  . Inter-disciplinary care team collaboration (see longitudinal plan of care) . Evaluation of current treatment plan related to DM and patient's adherence to plan as established by provider. . Reviewed current A1c is down to 6.9; Educated on  basic disease process including complications if uncontrolled; Educated on dietary and exercise recommendations, Provided recommendations for monitoring CBG twice daily before meals and at bedtime and logging BS; Educated on daily glycemic control FBS 80-130; <180 after meals; Educated on importance of implementing exercise to daily routine (150 min weekly), maintaining healthy weight and adhering to diabetic diet: Determined patient is now checking his CBG's at home 1-2 times daily and is logging his readings; Educated on 15'15' rule    . Reviewed medications with patient and discussed indication, dosage and frequency of prescribed medications for diabetes: Determined patient understands his regimen and is adhering, Patient reports having nocturia with urgency and frequency, stating, "I am urinating every 15 minutes during the nighttime since increasing the Jardiance" . Collaborated with PCP [name], FNP regarding patient's c/o of nocturia with increased urgency and frequency since increasing the Jardiance; Determined PCP would like patient to take Jardiance every other day . Provided patient with printed educational materials related to [employer] with Diabetes; Preventing Complications from Diabetes; Diabetes and Kidney Disease  . Mailed printed letter and  DM patient educational materials for patient to review and discuss at next RN CM f/u call  . Discussed plans with patient for ongoing care management follow up and provided patient with direct contact information for care management team  07/21/20 completed successful inbound call with patient  . Determined patient would like a call from the embedded Pharmacist to discuss medication concerns related to dosing and financial assistance . Collaborated with embedded Pharm D [name], communicated Mr. Grissinger's concerns and request for a call back  . Determined Coralyn Helling will contact Mr. Barbeau by phone and will address his concerns/questions   . Discussed plans with patient for ongoing care management follow up and provided patient with direct contact information for care management team  Patient Self Care Activities:  . Self administer medications as prescribed  (decrease Jardiance to 25 mg every other day per PCP [name] FNP due to frequent urination) . Attend all scheduled provider appointments . Call pharmacy for medication refills . Call provider office for new concerns or questions . Continue to monitor CBG's at home as discussed . Continue to adhere to dietary and exercise recommendations as discussed  . Review DM patient educational materials to discuss at next RN CM f/u call   Next Follow Up Call: 10/02/20   Plan:Telephone follow up appointment with care management team member scheduled for:  10/02/20   Barb Merino, RN, BSN, CCM Care Management Coordinator [facility]  Direct Phone: [phone]

## 2020-07-29 DIAGNOSIS — M542 Cervicalgia: Secondary | ICD-10-CM | POA: Diagnosis not present

## 2020-08-04 ENCOUNTER — Telehealth: Payer: Self-pay

## 2020-08-04 NOTE — Progress Notes (Signed)
Chronic Care Management Pharmacy Note  08/05/2020 Name:  Robert Walters MRN:  [id number] DOB:  1955/06/03  Subjective: Robert Walters is an 65 y.o. year old male who is a primary patient of [name], [place].  The CCM team was consulted for assistance with disease management and care coordination needs.  Patient reports that he watched the game last night and was disappointed with [facility] losing against [place]. He reports that he is feeling pretty good.   Engaged with patient by telephone for follow up visit in response to provider referral for pharmacy case management and/or care coordination services.   Consent to Services:  The patient was given information about Chronic Care Management services, agreed to services, and gave verbal consent prior to initiation of services.  Please see initial visit note for detailed documentation.   Patient Care Team: [name], FNP as PCP - General (General Practice) Nahser, Wonda Cheng, MD as PCP - Cardiology (Cardiology) Rex Kras Claudette Stapler, RN as Case Manager Caudill, Kennieth Francois, [facility] (Inactive) (Pharmacist)  Recent office visits: 06/30/2020 PCP OV   Recent consult visits: 03/21/2020 Cardiology [facility] visits: None in previous 6 months  Objective:  Lab Results  Component Value Date   CREATININE 1.50 (H) 06/30/2020   BUN 26 06/30/2020   GFRNONAA 50 (L) 03/26/2020   GFRAA 58 (L) 03/26/2020   NA 140 06/30/2020   K 5.0 06/30/2020   CALCIUM 9.8 06/30/2020   CO2 17 (L) 06/30/2020   GLUCOSE 162 (H) 06/30/2020    Lab Results  Component Value Date/Time   HGBA1C 6.9 (H) 06/30/2020 03:50 PM   HGBA1C 7.3 (H) 03/26/2020 11:09 AM   MICROALBUR 10 03/26/2020 01:16 PM   MICROALBUR 30 11/06/2019 04:23 PM    Last diabetic Eye exam:  Lab Results  Component Value Date/Time   HMDIABEYEEXA No Retinopathy 01/02/2020 12:00 AM    Last diabetic Foot exam: No results found for: HMDIABFOOTEX   Lab Results  Component Value Date   CHOL 191  06/30/2020   HDL 35 (L) 06/30/2020   LDLCALC 94 06/30/2020   LDLDIRECT 124.3 11/10/2010   TRIG 375 (H) 06/30/2020   CHOLHDL 5.5 (H) 06/30/2020    Hepatic Function Latest Ref Rng & Units 06/30/2020 03/26/2020 11/06/2019  Total Protein 6.0 - 8.5 g/dL 7.5 6.8 6.9  Albumin 3.8 - 4.8 g/dL 5.0(H) 4.4 4.4  AST 0 - 40 IU/L 51(H) 50(H) 36  ALT 0 - 44 IU/L 78(H) 66(H) 47(H)  Alk Phosphatase 44 - 121 IU/L 186(H) 191(H) 167(H)  Total Bilirubin 0.0 - 1.2 mg/dL 0.4 0.6 0.5  Bilirubin, Direct 0.0 - 0.2 mg/dL - - -    Lab Results  Component Value Date/Time   TSH 1.650 06/30/2020 03:50 PM   TSH 2.700 11/29/2018 10:46 AM   FREET4 1.69 03/24/2009 01:00 PM    CBC Latest Ref Rng & Units 03/26/2020 11/29/2018 09/01/2016  WBC 3.4 - 10.8 x10E3/uL 6.9 7.0 11.4(H)  Hemoglobin 13.0 - 17.7 g/dL 13.5 12.8(L) 12.1(L)  Hematocrit 37.5 - 51.0 % 40.2 37.6 36.0(L)  Platelets 150 - 450 x10E3/uL 178 161 315    No results found for: VD25OH  Clinical ASCVD: Yes  The ASCVD Risk score Mikey Bussing DC Jr., et al., 2013) failed to calculate for the following reasons:   The patient has a prior MI or stroke diagnosis    Depression screen [facility] 2/9 06/30/2020 03/26/2020 11/06/2019  Decreased Interest 0 0 0  Down, Depressed, Hopeless 0 0 0  PHQ -  2 Score 0 0 0  Altered sleeping 0 0 0  Tired, decreased energy 1 0 0  Change in appetite 0 0 0  Feeling bad or failure about yourself  0 0 0  Trouble concentrating 0 0 0  Moving slowly or fidgety/restless 0 0 0  Suicidal thoughts 0 0 0  PHQ-9 Score 1 0 0  Difficult doing work/chores Not difficult at all - -  Some recent data might be hidden     Social History   Tobacco Use  Smoking Status Never Smoker  Smokeless Tobacco Never Used   BP Readings from Last 3 Encounters:  06/30/20 116/72  03/26/20 124/86  03/26/20 124/86   Pulse Readings from Last 3 Encounters:  06/30/20 94  03/26/20 76  03/26/20 76   Wt Readings from Last 3 Encounters:  06/30/20 232 lb (105.2 kg)   03/26/20 228 lb (103.4 kg)  03/26/20 228 lb (103.4 kg)   BMI Readings from Last 3 Encounters:  06/30/20 34.26 kg/m  03/26/20 33.67 kg/m  03/26/20 33.67 kg/m    Assessment/Interventions: Review of patient past medical history, allergies, medications, health status, including review of consultants reports, laboratory and other test data, was performed as part of comprehensive evaluation and provision of chronic care management services.   SDOH:  (Social Determinants of Health) assessments and interventions performed: No  SDOH Screenings   Alcohol Screen: Not on file  Depression (PHQ2-9): Low Risk   . PHQ-2 Score: 1  Financial Resource Strain: Low Risk   . Difficulty of Paying Living Expenses: Not hard at all  Food Insecurity: No Food Insecurity  . Worried About Charity fundraiser in the Last Year: Never true  . Ran Out of Food in the Last Year: Never true  Housing: Not on file  Physical Activity: Inactive  . Days of Exercise per Week: 0 days  . Minutes of Exercise per Session: 0 min  Social Connections: Not on file  Stress: No Stress Concern Present  . Feeling of Stress : Not at all  Tobacco Use: Low Risk   . Smoking Tobacco Use: Never Smoker  . Smokeless Tobacco Use: Never Used  Transportation Needs: No Transportation Needs  . Lack of Transportation (Medical): No  . Lack of Transportation (Non-Medical): No    CCM Care Plan  No Known Allergies  Medications Reviewed Today    Reviewed by Mayford Knife, RPH (Pharmacist) on 08/05/20 at 1130  Med List Status: <None>  Medication Order Taking? Sig Documenting Provider Last Dose Status Informant  alfuzosin (UROXATRAL) 10 MG 24 hr tablet [id number] Yes Take 1 tablet (10 mg total) by mouth daily. Franchot Gallo, MD Taking Active   Ascorbic Acid (VITAMIN C) 500 MG CAPS [id number] Yes Take 500 mg by mouth daily.  [provider] Taking Active   aspirin EC 81 MG tablet [id number] Yes Take 1 tablet (81 mg total) by  mouth daily. Nahser, Wonda Cheng, MD Taking Active   atorvastatin (LIPITOR) 40 MG tablet [id number] Yes TAKE 1 TABLET BY MOUTH AT BEDTIME [name], FNP Taking Active   b complex vitamins tablet [id number] Yes Take 1 tablet by mouth daily.  [provider] Taking Active   cholecalciferol (VITAMIN D3) 25 MCG (1000 UNIT) tablet [id number] Yes Take 2 each by mouth daily.  [provider] Taking Active Self  CINNAMON PO [id number] Yes Take 1 tablet by mouth daily. 1080m daily [provider] Taking Active Self  Coenzyme Q10 (COQ10) 200 MG CAPS [id number]  Take 1 tablet by mouth daily.  [provider] Taking Active Self  empagliflozin (JARDIANCE) 25 MG TABS tablet [id number] Yes Take 1 tablet (25 mg total) by mouth daily before breakfast.  Patient taking differently: Take 25 mg by mouth every other day.   [name], FNP Taking Active   FLUoxetine (PROZAC) 40 MG capsule [id number] Yes Take 1 capsule (40 mg total) by mouth every morning. Alen Blew Taking Active   Garlic 5885 MG CAPS [id number] Yes Take 1 tablet by mouth daily.  [provider] Taking Active   glucose blood test strip [id number] Yes Use to check blood sugars daily 3 times daily  E11.65 [name], FNP Taking Active   insulin degludec (TRESIBA FLEXTOUCH) 100 UNIT/ML FlexTouch Pen [id number] Yes Inject 50 Units into the skin daily. [name], FNP Taking Active   Insulin Pen Needle (PEN NEEDLES) 32G X 4 MM MISC [id number] Yes Use as directed [name], FNP Taking Active   lamoTRIgine (LAMICTAL) 150 MG tablet [id number] Yes Take 1 tablet (150 mg total) by mouth 2 (two) times daily. Addison Lank, PA-C Taking Active   LORazepam (ATIVAN) 1 MG tablet [id number] Yes TAKE 1 TABLET BY MOUTH THREE TIMES DAILY AS NEEDED FOR ANXIETY. GENERIC EQUIVALENT FOR ATIVAN. Addison Lank, PA-C Taking Active   losartan (COZAAR) 100 MG tablet [id number] Yes Take 1 tablet (100 mg total) by mouth  daily. Nahser, Wonda Cheng, MD Taking Active   metFORMIN (GLUCOPHAGE) 1000 MG tablet [id number] Yes TAKE 1 TABLET BY MOUTH 2 TIMES DAILY WITH MEALS [name], FNP Taking Active   metoprolol tartrate (LOPRESSOR) 25 MG tablet [id number] Yes TAKE 1 TABLET BY MOUTH 2 TIMES DAILY. GENERIC EQUIVALENT FOR LOPRESSOR. Nahser, Wonda Cheng, MD Taking Active   Multiple Vitamin (MULTIVITAMIN WITH MINERALS) TABS tablet [id number] Yes Take 1 tablet by mouth daily. [provider] Taking Active Self  Omega-3 Fatty Acids (FISH OIL) 1000 MG CAPS [id number] Yes Take 1 capsule by mouth daily. [provider] Taking Active   omeprazole (PRILOSEC) 40 MG capsule [id number] Yes TAKE 1 CAPSULE BY MOUTH 20 MINUTES BEFORE BREAKFAST. Montez Morita, Quillian Quince, MD Taking Active   Semaglutide [facility]) 14 MG TABS [id number] Yes Take 14 mg by mouth daily at 6 (six) AM. [name], FNP Taking Active   vitamin B-12 (CYANOCOBALAMIN) 500 MCG tablet [id number] Yes Take 500 mcg by mouth daily. [provider] Taking Active           Patient Active Problem List   Diagnosis Date Noted  . Chronic diastolic heart failure ([place]) 03/27/2019  . Fatty liver 12/26/2018  . Elevated LFTs 12/26/2018  . History of colonic polyps 12/26/2018  . Fall 08/28/2018  . Rib pain on left side 08/28/2018  . Anxiety 03/31/2018  . Bipolar II disorder ([place]) 03/31/2018  . Insomnia 03/31/2018  . Type 2 diabetes mellitus without complication, without long-term current use of insulin ([place]) 03/24/2018  . Bile leak 09/15/2016  . Sepsis ([place]) 08/23/2016  . Cholecystitis   . Stroke ([place]) 07/25/2015  . 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 Dizziness 08/09/2014  . TIA (transient ischemic attack) 05/15/2012  . Blurred vision 05/15/2012  . Ataxia 05/15/2012  . Diabetes mellitus type 2 with complications ([place]) 20/35/5974  . BPH (benign prostatic hyperplasia) 05/15/2012  . GERD (gastroesophageal reflux disease) 05/15/2012  . Obesity  05/15/2012  . Bipolar 1 disorder ([place]) 05/15/2012  . Essential hypertension 10/30/2010  . Cardiovascular risk factor 10/30/2010  . Pure  hypercholesterolemia 12/05/2008    Immunization History  Administered Date(s) Administered  . DTaP 10/05/2011  . Influenza, High Dose Seasonal PF 01/15/2019  . Influenza-Unspecified 03/03/2012, 03/16/2018, 03/21/2020  . Moderna Sars-Covid-2 Vaccination 12/07/2019, 01/04/2020  . Pneumococcal Conjugate-13 03/21/2019  . Pneumococcal Polysaccharide-23 05/16/2012  . Zoster Recombinat (Shingrix) 11/19/2019    Conditions to be addressed/monitored:  Hypertension, Hyperlipidemia and Diabetes  Care Plan : [place]  Updates made by Mayford Knife, RPH since 08/05/2020 12:00 AM    Problem: HTN, HLD, DM II     Goal: Disease Management   Start Date: 07/03/2020  Recent Progress: On track  Priority: High  Note:    Current Barriers:  . Unable to independently afford treatment regimen  Pharmacist Clinical Goal(s):  [name] Patient will achieve adherence to monitoring guidelines and medication adherence to achieve therapeutic efficacy through collaboration with PharmD and provider.   Interventions: . 1:1 collaboration with [name], FNP regarding development and update of comprehensive plan of care as evidenced by provider attestation and co-signature . Inter-disciplinary care team collaboration (see longitudinal plan of care) . Comprehensive medication review performed; medication list updated in electronic medical record  Hypertension (BP goal <130/80) -Controlled -Current treatment: . Losartan 100 mg tablet daily  . Metoprolol Tartrate 25 mg tablet two times per day  -Current home readings: patient is not currently checking his BP at home  -Current dietary habits: he has cut back on fast food and increased vegetables  -Current exercise habits: walking around [place] park three times per week  -Denies hypotensive/hypertensive  symptoms -Educated on Daily salt intake goal < 2300 mg; Exercise goal of 150 minutes per week; Symptoms of hypotension and importance of maintaining adequate hydration; -Counseled to monitor BP at home once a week, document, and provide log at future appointments -Counseled on diet and exercise extensively Recommended to continue current medication  Hyperlipidemia: (LDL goal < 70) -Not ideally controlled -Current treatment: . Atorvastatin 40 mg tablet daily  . Aspirin 81 mg tablet once per day -Current dietary patterns: patient reports limiting the amount of fried foods. He report that he is eating TV dinners.  -Patient is scheduled to see the cardiologist in May  -Educated on Cholesterol goals;  Benefits of statin for ASCVD risk reduction; Importance of limiting foods high in cholesterol; -Counseled on diet and exercise extensively Recommended to continue current medication   Diabetes (A1c goal <7%) -Controlled -Current medications: [name] Rybelsus 14 mg tablet once per day . Tresiba  50 units into the skin daily . Jardiance 25 mg tablet every other day   . Metformin 1000 mg take 1 tablet twice daily  -Current home glucose readings . fasting glucose: 125- 140  -Denies hypoglycemic/hyperglycemic symptoms -Current meal patterns:  . breakfast: egg, sausage, breakfast  . dinner: beans, or spaghetti  . drinks: Diet Tea and he has been drinking more sugar free drinks.  -Current exercise:  -Educated on Prevention and management of hypoglycemic episodes; Benefits of routine self-monitoring of blood sugar; Carbohydrate counting and/or plate method  -Explained to patient that he should take the Jardiance medication every other day -Patient assistance for Tresiba and Rybelsus have been approved.  -Counseled to check feet daily and get yearly eye exams -Counseled on diet and exercise extensively Recommended to continue current medication Collaborated with CCM team to help patient find  dietician.     Patient Goals/Self-Care Activities . Patient will:  - take medications as prescribed  Follow Up Plan: Telephone follow  up appointment with care management team member scheduled for: The patient has been provided with contact information for the care management team and has been advised to call with any health related questions or concerns.        Medication Assistance: Rybelsus and [name] obtained through [employer] medication assistance program.  Enrollment ends 04/2021  Patient's preferred pharmacy is:  [facility] [place], [place] [address] Phone: [phone] Fax: [place], [place] [address] Phone: [phone] Fax: [phone]  [place], [place] - [address] [place] [address] Phone: [phone] Fax: [phone]  [id number] [place]Phone: [phone]: [phone] Uses pill box? Yes Pt endorses 90% compliance  We discussed: Benefits of medication synchronization, packaging and delivery as well as enhanced pharmacist oversight with [employer]. Patient decided to: Continue current medication management strategy  Care Plan and Follow Up Patient Decision:  Patient agrees to Care Plan and Follow-up.  Plan: Telephone follow up appointment with care management team member scheduled for:  10/02/2020 and The patient has been provided with contact information for the care management team and has been advised to call with any health related questions or concerns.   VOrlando Penner PharmD Clinical Pharmacist [facility] [phone]

## 2020-08-04 NOTE — Progress Notes (Signed)
08/04/20-Patient reminded of his CCM Call follow up tomorrow, 08/05/20 at 11:00 AM with [name], CPP. Patient advised to have his medication and supplements near during phone visit. Patient voiced understanding.  [name], CPP Notified.  Raynelle Highland, [place] Pharmacist Assistant [phone] CCM Total Time : 9 minutes

## 2020-08-05 ENCOUNTER — Ambulatory Visit (INDEPENDENT_AMBULATORY_CARE_PROVIDER_SITE_OTHER): Payer: Medicare Other

## 2020-08-05 DIAGNOSIS — E1122 Type 2 diabetes mellitus with diabetic chronic kidney disease: Secondary | ICD-10-CM | POA: Diagnosis not present

## 2020-08-05 DIAGNOSIS — N1831 Chronic kidney disease, stage 3a: Secondary | ICD-10-CM | POA: Diagnosis not present

## 2020-08-05 DIAGNOSIS — M542 Cervicalgia: Secondary | ICD-10-CM | POA: Diagnosis not present

## 2020-08-05 DIAGNOSIS — E782 Mixed hyperlipidemia: Secondary | ICD-10-CM | POA: Diagnosis not present

## 2020-08-05 DIAGNOSIS — I10 Essential (primary) hypertension: Secondary | ICD-10-CM | POA: Diagnosis not present

## 2020-08-05 NOTE — Patient Instructions (Signed)
Visit Information It was great speaking with you today!  Please let me know if you have any questions about our visit.  Goals Addressed   None     Patient Care Plan: Chronic Kidney (Adult)    Problem Identified: Disease Progression   Priority: Medium    Long-Range Goal: Disease Progression Prevented or Minimized   Start Date: 07/01/2020  Expected End Date: 01/01/2021  This Visit's Progress: On track  Priority: Medium  Note:   CARE PLAN ENTRY (see longitudinal plan of care for additional care plan information)  Current Barriers:  [name] Knowledge Deficits related to disease process and Self Health management of CKD . Chronic Disease Management support and education needs related to DMII, Essential Hypertension, Stage 3a CKD, Mixed Hyperlipidemia  Nurse Case Manager Clinical Goal(s):  [name] Over the next 180 days, patient will work with the CCM RN CM and PCP to address needs related to disease education and support to improve CKD as evidence by disease progression will be minimized or avoided  CCM RN CM Interventions:  07/02/19 successful call completed with patient  . Inter-disciplinary care team collaboration (see longitudinal plan of care) . Evaluation of current treatment plan related to CKD and patient's adherence to plan as established by provider. . Provided education to patient re: kidney decline with recent GFR < 51; Educated patient on stages of kidney disease and interventions to help improve kidney function, including improved diabetic control, increasing water and exercise   . Discussed plans with patient for ongoing care management follow up and provided patient with direct contact information for care management team Patient Self Care Activities:  . Self administer medications as prescribed . Attend all scheduled provider appointments . Call pharmacy for medication refills . Call provider office for new concerns or questions . Increase water intake to 64 oz per day  Next Follow  Up Call: 10/02/20   Patient Care Plan: Diabetes Type 2 (Adult)    Problem Identified: Glycemic Management (Diabetes, Type 2)   Priority: Medium    Long-Range Goal: Glycemic Management Optimized   Start Date: 07/01/2020  Expected End Date: 01/01/2021  Recent Progress: On track  Priority: Medium  Note:   CARE PLAN ENTRY (see longitudinal plan of care for additional care plan information)  Current Barriers:  [name] Knowledge Deficits related to disease process and Self Health management of DM  . Chronic Disease Management support and education needs related to DMII, Essential Hypertension, Stage 3a CKD, Mixed Hyperlipidemia  Nurse Case Manager Clinical Goal(s):  [name] Over the next 180 days, patient will work with CCM RN CM and PCP to address needs related to disease education and support to improve Self Health management of DM as evidence by patient will lower and maintain A1c <7.0 CCM RN CM Interventions:  07/01/20 call completed with patient  . Inter-disciplinary care team collaboration (see longitudinal plan of care) . Evaluation of current treatment plan related to DM and patient's adherence to plan as established by provider. . Reviewed current A1c is down to 6.9; Educated on basic disease process including complications if uncontrolled; Educated on dietary and exercise recommendations, Provided recommendations for monitoring CBG twice daily before meals and at bedtime and logging BS; Educated on daily glycemic control FBS 80-130; <180 after meals; Educated on importance of implementing exercise to daily routine (150 min weekly), maintaining healthy weight and adhering to diabetic diet: Determined patient is now checking his CBG's at home 1-2 times daily and is logging his readings; Educated on 15'15'  rule    . Reviewed medications with patient and discussed indication, dosage and frequency of prescribed medications for diabetes: Determined patient understands his regimen and is adhering, Patient reports  having nocturia with urgency and frequency, stating, "I am urinating every 15 minutes during the nighttime since increasing the Jardiance" . Collaborated with PCP [name], FNP regarding patient's c/o of nocturia with increased urgency and frequency since increasing the Jardiance; Determined PCP would like patient to take Jardiance every other day . Provided patient with printed educational materials related to [employer] with Diabetes; Preventing Complications from Diabetes; Diabetes and Kidney Disease  . Mailed printed letter and DM patient educational materials for patient to review and discuss at next RN CM f/u call  . Discussed plans with patient for ongoing care management follow up and provided patient with direct contact information for care management team  07/21/20 completed successful inbound call with patient  . Determined patient would like a call from the embedded Pharmacist to discuss medication concerns related to dosing and financial assistance . Collaborated with embedded Pharm D [name], communicated Mr. Veach's concerns and request for a call back  . Determined Coralyn Helling will contact Mr. Cuthbert by phone and will address his concerns/questions  . Discussed plans with patient for ongoing care management follow up and provided patient with direct contact information for care management team  Patient Self Care Activities:  . Self administer medications as prescribed  (decrease Jardiance to 25 mg every other day per PCP [name] FNP due to frequent urination) . Attend all scheduled provider appointments . Call pharmacy for medication refills . Call provider office for new concerns or questions . Continue to monitor CBG's at home as discussed . Continue to adhere to dietary and exercise recommendations as discussed  . Review DM patient educational materials to discuss at next RN CM f/u call   Next Follow Up Call: 10/02/20     Patient Care Plan: Mixed  Hyperlipidemia    Problem Identified: Mixed Hyperlipidemia   Priority: High    Long-Range Goal: Mixed Hyperlipidemia - Disease Progression minimized or avoided   Start Date: 07/01/2020  Expected End Date: 01/01/2021  This Visit's Progress: On track  Priority: High  Note:   CARE PLAN ENTRY (see longitudinal plan of care for additional care plan information)  Current Barriers:  [name] Knowledge Deficits related to disease process and Self Health management of Mixed Hyperlipidemia  . Chronic Disease Management support and education needs related to DMII, Essential Hypertension, Stage 3a CKD, Mixed Hyperlipidemia  Nurse Case Manager Clinical Goal(s):  [name] Over the next 180 days, patient will work with the CCM team and PCP to address needs related to disease education and support to improve Self Health management of Mixed Hyperlipidemia as evidence by patient will lower and maintain Cholesterol levels within normal limits   CCM RN CM Interventions:  07/02/19 call completed with patient  . Inter-disciplinary care team collaboration (see longitudinal plan of care) . Evaluation of current treatment plan related to Mixed Hyperlipidemia and patient's adherence to plan as established by provider. . Provided education to patient re: current lipid panel; educated on dietary and exercise recommendations; educated on disease process and potential complications if left untreated . Reviewed medications with patient and discussed patient reports adherence to taking Atorvastatin 40 mg q hs  . Collaborated with PCP [name], FNP  regarding referral placement for [place] [facility] . Confirmed patient received and reviewed printed educational materials related to 13  Cholesterol Lowering Foods; 12 Triglyceride Lowering Foods; 11 Foods to increase your HDL . Discussed plans with patient for ongoing care management follow up and provided patient with direct contact information for care management team Patient Self  Care Activities:  . Self administer medications as prescribed . Attend all scheduled provider appointments . Call pharmacy for medication refills . Adhere to heart healthy diet as discussed  . Follow up with the lipid clinic as recommended by PCP for evaluation of elevated triglycerides   Next Follow Up Date: 10/02/20    Patient Care Plan: CCM Pharmacy Care Plan    Problem Identified: HTN, HLD, DM, Depression   Priority: High    Long-Range Goal: Disease Management   Start Date: 07/03/2020  This Visit's Progress: On track  Priority: High  Note:   Current Barriers:  . Unable to independently afford treatment regimen  Pharmacist Clinical Goal(s):  [name] Over the next 90 days, patient will verbalize ability to afford treatment regimen through collaboration with PharmD and provider.    Interventions: . 1:1 collaboration with [name], FNP regarding development and update of comprehensive plan of care as evidenced by provider attestation and co-signature . Inter-disciplinary care team collaboration (see longitudinal plan of care) . Comprehensive medication review performed; medication list updated in electronic medical record  Hypertension (BP goal <130/80) -Controlled -Current treatment: . Losartan 100 mg taking 1 tablet by mouth daily . Lopressor 25 mg take 1 tablet by mouth two time daily .   -Current home readings: none at this time -Current dietary habits: patient reports that he is trying to eat a bit more healthy.  -Current exercise habits: patient is not exercising as often, he reports that [place] sometimes.  -Denies hypotensive/hypertensive symptoms -Educated on BP goals and benefits of medications for prevention of heart attack, stroke and kidney damage; Daily salt intake goal < 2300 mg; Proper BP monitoring technique; -Counseled to monitor BP at home 2-3 times per week, document, and provide log at future appointments -Counseled on diet and exercise  extensively Recommended to continue current medication  Hyperlipidemia: (LDL goal < 70) -Not ideally controlled -Current treatment: . Atorvastatin 40 mg tablet once a day at bedtime  . Aspirin 81 mg tablet daily  -Current exercise habits: use to exercise but is not active anymore  -Educated on Cholesterol goals;  Benefits of statin for ASCVD risk reduction; Importance of limiting foods high in cholesterol; Exercise goal of 150 minutes per week; -Counseled on diet and exercise extensively Recommended to continue current medication  Diabetes (A1c goal <7%) -Controlled -Current medications: . Tresiba 100 unit/mL - Inject 50 units into the skin daily . Rybelsus 14 mg taking 1 tablet by mouth daily  . Jardiance 25 mg taking 1 tablet by mouth daily   -Current home glucose readings . fasting glucose: 135-150, 202 some mornings  -Denies hypoglycemic/hyperglycemic symptoms -Current meal patterns:  . snacks: a bowl of cereal at night, with whole milk . drinks: whole milk 2-3 times per day, he does not like any other type of milk.  -Educated onA1c and blood sugar goals; Complications of diabetes including kidney damage, retinal damage, and cardiovascular disease; Carbohydrate counting and/or plate method -Counseled to check feet daily and get yearly eye exams -Recommended to continue current medication  Depression -Controlled -Current treatment: . Lorazepam 1 mg take 1 tablet by mouth three times per day as needed for anxiety.  . Fluoxetine 40 mg take 1 capsule by mouth every morning -Educated on Benefits of  medication for symptom control -Recommended to continue current medication   Health Maintenance -Current therapy:  . Garlic 4627 mg take 1 tablet by mouth daily  . Coenzyme Q10 200 mg take 1 capsule by mouth daily . Omega 3 - FA's 1000 mg take 1 capsule by mouth daily  . Vitamin B-12 500 mcg- take 1 tablet by mouth daily . Cinnamon PO  - take 1 tablet by mouth  daily . Vitamin D3 1000 unit - take 1 tablet by mouth daily . B - complex Vitamin - take 1 tablet by mouth daily  . [employer]  - once a day  -Educated on Herbal supplement research is limited and benefits usually cannot be proven Cost vs benefit of each product must be carefully weighed by individual consumer Supplements may interfere with prescription drugs  -Patient is satisfied with current therapy and denies issues -Counseled on how herbal supplements can intererfere with medications. Recommended patient limit the amount of extra vitamins and supplements that he takes.  Collaborated with patient to stop taking Cinnamon and Garlic.    Patient Goals/Self-Care Activities . Over the next 90 days, patient will:  - take medications as prescribed  Follow Up Plan: Telephone follow up appointment with care management team member scheduled for: 10/07/2020    Patient Care Plan: CCM Pharmacy Care Plan    Problem Identified: HTN, HLD, DM II     Goal: Disease Management   Start Date: 07/03/2020  Recent Progress: On track  Priority: High  Note:    Current Barriers:  . Unable to independently afford treatment regimen  Pharmacist Clinical Goal(s):  [name] Patient will achieve adherence to monitoring guidelines and medication adherence to achieve therapeutic efficacy through collaboration with PharmD and provider.   Interventions: . 1:1 collaboration with [name], FNP regarding development and update of comprehensive plan of care as evidenced by provider attestation and co-signature . Inter-disciplinary care team collaboration (see longitudinal plan of care) . Comprehensive medication review performed; medication list updated in electronic medical record  Hypertension (BP goal <130/80) -Controlled -Current treatment: . Losartan 100 mg tablet daily  . Metoprolol Tartrate 25 mg tablet two times per day  -Current home readings: patient is not currently checking his BP at home   -Current dietary habits: he has cut back on fast food and increased vegetables  -Current exercise habits: [place] three times per week  -Denies hypotensive/hypertensive symptoms -Educated on Daily salt intake goal < 2300 mg; Exercise goal of 150 minutes per week; Symptoms of hypotension and importance of maintaining adequate hydration; -Counseled to monitor BP at home once a week, document, and provide log at future appointments -Counseled on diet and exercise extensively Recommended to continue current medication  Hyperlipidemia: (LDL goal < 70) -Not ideally controlled -Current treatment: . Atorvastatin 40 mg tablet daily  . Aspirin 81 mg tablet once per day -Current dietary patterns: patient reports limiting the amount of fried foods. He report that he is eating TV dinners.  -Patient is scheduled to see the cardiologist in May  -Educated on Cholesterol goals;  Benefits of statin for ASCVD risk reduction; Importance of limiting foods high in cholesterol; -Counseled on diet and exercise extensively Recommended to continue current medication   Diabetes (A1c goal <7%) -Controlled -Current medications: [name] Rybelsus 14 mg tablet once per day . Tresiba  50 units into the skin daily . Jardiance 25 mg tablet every other day   . Metformin 1000 mg take 1 tablet  twice daily  -Current home glucose readings . fasting glucose: 125- 140  -Denies hypoglycemic/hyperglycemic symptoms -Current meal patterns:  . breakfast: egg, sausage, breakfast  . dinner: beans, or spaghetti  . drinks: Diet Tea and he has been drinking more sugar free drinks.  -Current exercise:  -Educated on Prevention and management of hypoglycemic episodes; Benefits of routine self-monitoring of blood sugar; Carbohydrate counting and/or plate method  -Explained to patient that he should take the Jardiance medication every other day -Patient assistance for Tresiba and Rybelsus have been  approved.  -Counseled to check feet daily and get yearly eye exams -Counseled on diet and exercise extensively Recommended to continue current medication Collaborated with CCM team to help patient find dietician.     Patient Goals/Self-Care Activities . Patient will:  - take medications as prescribed  Follow Up Plan: Telephone follow up appointment with care management team member scheduled for: The patient has been provided with contact information for the care management team and has been advised to call with any health related questions or concerns.        Patient agreed to services and verbal consent obtained.   The patient verbalized understanding of instructions, educational materials, and care plan provided today and declined offer to receive copy of patient instructions, educational materials, and care plan.   [name], PharmD Clinical Pharmacist [facility] [phone]

## 2020-08-12 ENCOUNTER — Telehealth: Payer: Self-pay

## 2020-08-12 NOTE — Telephone Encounter (Signed)
Patient notified that medication is ready for pickup. rybelsus

## 2020-08-14 ENCOUNTER — Telehealth: Payer: Self-pay

## 2020-08-14 DIAGNOSIS — M542 Cervicalgia: Secondary | ICD-10-CM | POA: Diagnosis not present

## 2020-08-14 NOTE — Telephone Encounter (Signed)
Patient left voicemail on yesterday requesting call back. I tried to call the patient three times yesterday with no success. Was able to reach the patient today and re-assured him that we would resend a prescription for Rybelsus 14 mg to [facility] and call to find out how long it would take for his medication to come in from the patient assistance program.   Total Time: 17 minutes   [name], PharmD Clinical Pharmacist [facility] [phone]

## 2020-08-14 NOTE — Chronic Care Management (AMB) (Addendum)
4/14//2022- [employer], spoke with [place] regarding different strength for patients Rybelsus prescription. Patient is taking 14 mg daily and application was sent in was for 7/14 mg. Patient would like the 14 mg prescription. Per Jenny Reichmann refill/reorder will need to be faxed over with the increase dose change. Printing refill/reorder for PCP to sign. Will fax once completed.   08/27/2020- Received refill/reorder back from PCP, faxed to [facility] for dose change in Rybelsus.    Pattricia Boss, [place] Pharmacist Assistant [phone]

## 2020-08-28 ENCOUNTER — Other Ambulatory Visit (INDEPENDENT_AMBULATORY_CARE_PROVIDER_SITE_OTHER): Payer: Self-pay | Admitting: Gastroenterology

## 2020-08-28 DIAGNOSIS — K219 Gastro-esophageal reflux disease without esophagitis: Secondary | ICD-10-CM

## 2020-09-01 DIAGNOSIS — I739 Peripheral vascular disease, unspecified: Secondary | ICD-10-CM | POA: Diagnosis not present

## 2020-09-05 ENCOUNTER — Telehealth: Payer: Self-pay

## 2020-09-05 NOTE — Chronic Care Management (AMB) (Addendum)
Chronic Care Management Pharmacy Assistant   Name: Robert Walters  MRN: [id number] DOB: 01-04-1956   Reason for Encounter: Disease State/ Diabetes    Recent office visits:  None  Recent consult visits:  None  Hospital visits:  None in previous 6 months  Medications: Outpatient Encounter Medications as of 09/05/2020  Medication Sig   alfuzosin (UROXATRAL) 10 MG 24 hr tablet Take 1 tablet (10 mg total) by mouth daily.   Ascorbic Acid (VITAMIN C) 500 MG CAPS Take 500 mg by mouth daily.    aspirin EC 81 MG tablet Take 1 tablet (81 mg total) by mouth daily.   atorvastatin (LIPITOR) 40 MG tablet TAKE 1 TABLET BY MOUTH AT BEDTIME   b complex vitamins tablet Take 1 tablet by mouth daily.    cholecalciferol (VITAMIN D3) 25 MCG (1000 UNIT) tablet Take 2 each by mouth daily.    CINNAMON PO Take 1 tablet by mouth daily. 1000mg  daily   Coenzyme Q10 (COQ10) 200 MG CAPS Take 1 tablet by mouth daily.    empagliflozin (JARDIANCE) 25 MG TABS tablet Take 1 tablet (25 mg total) by mouth daily before breakfast. (Patient taking differently: Take 25 mg by mouth every other day.)   FLUoxetine (PROZAC) 40 MG capsule Take 1 capsule (40 mg total) by mouth every morning.   Garlic 4132 MG CAPS Take 1 tablet by mouth daily.    glucose blood test strip Use to check blood sugars daily 3 times daily  E11.65   insulin degludec (TRESIBA FLEXTOUCH) 100 UNIT/ML FlexTouch Pen Inject 50 Units into the skin daily.   Insulin Pen Needle (PEN NEEDLES) 32G X 4 MM MISC Use as directed   lamoTRIgine (LAMICTAL) 150 MG tablet Take 1 tablet (150 mg total) by mouth 2 (two) times daily.   LORazepam (ATIVAN) 1 MG tablet TAKE 1 TABLET BY MOUTH THREE TIMES DAILY AS NEEDED FOR ANXIETY. GENERIC EQUIVALENT FOR ATIVAN.   losartan (COZAAR) 100 MG tablet Take 1 tablet (100 mg total) by mouth daily.   metFORMIN (GLUCOPHAGE) 1000 MG tablet TAKE 1 TABLET BY MOUTH 2 TIMES DAILY WITH MEALS   metoprolol tartrate (LOPRESSOR) 25 MG tablet  TAKE 1 TABLET BY MOUTH 2 TIMES DAILY. GENERIC EQUIVALENT FOR LOPRESSOR.   Multiple Vitamin (MULTIVITAMIN WITH MINERALS) TABS tablet Take 1 tablet by mouth daily.   Omega-3 Fatty Acids (FISH OIL) 1000 MG CAPS Take 1 capsule by mouth daily.   omeprazole (PRILOSEC) 40 MG capsule TAKE 1 CAPSULE BY MOUTH 20 MINUTES BEFORE BREAKFAST.   Semaglutide (RYBELSUS) 14 MG TABS Take 14 mg by mouth daily at 6 (six) AM.   vitamin B-12 (CYANOCOBALAMIN) 500 MCG tablet Take 500 mcg by mouth daily.   No facility-administered encounter medications on file as of 09/05/2020.   Recent Relevant Labs: Lab Results  Component Value Date/Time   HGBA1C 6.9 (H) 06/30/2020 03:50 PM   HGBA1C 7.3 (H) 03/26/2020 11:09 AM   MICROALBUR 10 03/26/2020 01:16 PM   MICROALBUR 30 11/06/2019 04:23 PM    Kidney Function Lab Results  Component Value Date/Time   CREATININE 1.50 (H) 06/30/2020 03:50 PM   CREATININE 1.45 (H) 03/26/2020 11:09 AM   CREATININE 1.33 (H) 10/19/2016 02:51 PM   GFRNONAA 50 (L) 03/26/2020 11:09 AM   GFRAA 58 (L) 03/26/2020 11:09 AM    Current antihyperglycemic regimen:  Jardiance 25mg  daily before breakfast Tresiba 50 units daily  Metformin 1000mg  twice daily Rybelsus 7mg  daily (Patient reports his sugars have increased on 7mg  would  like to go back to 14mg )   What recent interventions/DTPs have been made to improve glycemic control:   Have there been any recent hospitalizations or ED visits since last visit with CPP? No   Patient denies hypoglycemic symptoms  Patient denies hyperglycemic symptoms  How often are you checking your blood sugar? twice daily   What are your blood sugars ranging?  Fasting: 165 Before meals:None After meals: None Bedtime: 187  During the week, how often does your blood glucose drop below 70? Never   Are you checking your feet daily/regularly? Patient states he is checking feet   Adherence Review: Is the patient currently on a STATIN medication? Yes Is the  patient currently on ACE/ARB medication? Yes Does the patient have >5 day gap between last estimated fill dates? No  NOTE: Patient would like increase Rybelsus back to 14mg  due to sugar levels increasing with 7mg   Star Rating Drugs: Atorvastatin 40mg - Last filled 06-11-2020 [facility] Losartan 100mg - Last filled 06-11-2020 [facility] Rybelsus 14mg - Last filled 07-21-2020 [facility] Metformin 1000mg - Last filled 06-11-2020 [facility] Jardiance 25mg - Last filled 02-19-2020 [facility]  [phone]  I have reviewed the care management and care coordination activities outlined in this encounter and I am certifying that I agree with the content of this note. No further action required.  Mayford Knife, [facility] 09/12/20 12:53 PM

## 2020-09-10 ENCOUNTER — Telehealth: Payer: Self-pay

## 2020-09-10 NOTE — Telephone Encounter (Signed)
Tried to leave pt message letting him know that his Rybelsus is ready for pickup.

## 2020-09-15 ENCOUNTER — Ambulatory Visit: Payer: Medicare Other | Admitting: Internal Medicine

## 2020-09-15 ENCOUNTER — Encounter: Payer: Self-pay | Admitting: Internal Medicine

## 2020-09-15 ENCOUNTER — Other Ambulatory Visit: Payer: Self-pay

## 2020-09-15 VITALS — BP 130/76 | HR 104 | Ht 69.0 in | Wt 237.4 lb

## 2020-09-15 DIAGNOSIS — E782 Mixed hyperlipidemia: Secondary | ICD-10-CM

## 2020-09-15 DIAGNOSIS — E669 Obesity, unspecified: Secondary | ICD-10-CM

## 2020-09-15 DIAGNOSIS — E1169 Type 2 diabetes mellitus with other specified complication: Secondary | ICD-10-CM | POA: Diagnosis not present

## 2020-09-15 DIAGNOSIS — I5032 Chronic diastolic (congestive) heart failure: Secondary | ICD-10-CM

## 2020-09-15 NOTE — Patient Instructions (Signed)
Medication Instructions:  INCREASE over-the-counter fish oil to TWO CAPSULES daily  *If you need a refill on your cardiac medications before your next appointment, please call your pharmacy*   Lab Work: FASTING lab work in 6 months (before next visit)  If you have labs (blood work) drawn today and your tests are completely normal, you will receive your results only by: [name] MyChart Message (if you have MyChart) OR . A paper copy in the mail If you have any lab test that is abnormal or we need to change your treatment, we will call you to review the results.   Testing/Procedures: NONE   Follow-Up: At [facility], you and your health needs are our priority.  As part of our continuing mission to provide you with exceptional heart care, we have created designated Provider Care Teams.  These Care Teams include your primary Cardiologist (physician) and Advanced Practice Providers (APPs -  Physician Assistants and Nurse Practitioners) who all work together to provide you with the care you need, when you need it.  We recommend signing up for the patient portal called "MyChart".  Sign up information is provided on this After Visit Summary.  MyChart is used to connect with patients for Virtual Visits (Telemedicine).  Patients are able to view lab/test results, encounter notes, upcoming appointments, etc.  Non-urgent messages can be sent to your provider as well.   To learn more about what you can do with MyChart, go to [email or web address].    Your next appointment:   6 month(s) - lipid clinic  The format for your next appointment:   In Person  Provider:   K. Mali Hilty, MD   Other Instructions

## 2020-09-15 NOTE — Progress Notes (Signed)
LIPID CLINIC CONSULT NOTE  Chief Complaint:  Follow-up dyslipidemia  Primary Care Physician: [name], FNP  Primary Cardiologist:  Mertie Moores, MD  HPI:  Robert Walters is a 65 y.o. male who is being seen today for the evaluation of dyslipidemia at the request of [name], [place]. 65 y.o. male who presents via audio/video conferencing for a telehealth visit today.  This is a pleasant 65 year old male patient of [name] with a history of hypertension, dyslipidemia, chronic diastolic heart failure and type 2 diabetes with gastroparesis.  More recently has had significant rise in A1c which peaked over 10 demonstrating morning blood sugars of 400.  He was started on long-acting insulin in July and has noted significant improvement in his numbers.  His triglycerides have tracked this as well rising from 371 a year ago up to 511 4 months ago.  LDL has remained fairly stable at 110 however he is on 2 mg daily.  He was also recommended to use over-the-counter fish oil.  09/15/2020  Robert Walters returns today in the office for follow-up of dyslipidemia.  He had repeat lipids in February which showed total cholesterol 191, triglycerides 375, HDL 35 and LDL 94.  This does represent somewhat of a reduction in his lipids although not dramatic.  The triglycerides are probably the lowest we have seen in a while.  A lot of this may have been related to his hemoglobin A1c which was over 10 at one point but is now down to 6.9 as of February.  Current treatment strategy includes atorvastatin 40 mg daily, and 1 g of fish oil daily.  PMHx:  Past Medical History:  Diagnosis Date  . Acid reflux disease   . Bipolar 1 disorder, mixed ([place])   . BPH (benign prostatic hypertrophy)   . CVA (cerebral vascular accident) ([place]) 07/2015  . Gastritis   . H/O hiatal hernia   . Hyperlipidemia   . Hypertension   . Normal nuclear stress test Dec 2011  . Obesity   . TIA (transient ischemic attack) 05/15/2012    "they think I've had one this am @ 0130" (05/15/2012)  . Type II diabetes mellitus ([place])   . Vertigo     Past Surgical History:  Procedure Laterality Date  . BILIARY STENT PLACEMENT N/A 09/17/2016   Procedure: BILIARY STENT PLACEMENT;  Surgeon: [name], MD;  Location: [facility];  Service: Endoscopy;  Laterality: N/A;  . CARDIOVASCULAR STRESS TEST  2009   EF 63%  . CHOLECYSTECTOMY N/A 08/25/2016   Procedure: LAPAROSCOPIC CHOLECYSTECTOMY;  Surgeon: [name], MD;  Location: [facility] ORS;  Service: General;  Laterality: N/A;  . COLONOSCOPY    . ERCP N/A 09/17/2016   Procedure: ENDOSCOPIC RETROGRADE CHOLANGIOPANCREATOGRAPHY (ERCP);  Surgeon: [name], MD;  Location: [facility];  Service: Endoscopy;  Laterality: N/A;  730  . ERCP N/A 12/03/2016   Procedure: ENDOSCOPIC RETROGRADE CHOLANGIOPANCREATOGRAPHY (ERCP);  Surgeon: [name], MD;  Location: [facility];  Service: Gastroenterology;  Laterality: N/A;  . ESOPHAGOGASTRODUODENOSCOPY    . GASTROINTESTINAL STENT REMOVAL N/A 12/03/2016   Procedure: BILIARY STENT REMOVAL;  Surgeon: [name], MD;  Location: [facility];  Service: Gastroenterology;  Laterality: N/A;  . NO PAST SURGERIES    . SPHINCTEROTOMY N/A 09/17/2016   Procedure: SPHINCTEROTOMY;  Surgeon: [name], MD;  Location: [facility];  Service: Endoscopy;  Laterality: N/A;    FAMHx:  Family History  Problem Relation Age  of Onset  . Hyperlipidemia Father        in a nursing home  . Colon cancer Father   . Stroke Mother 39    SOCHx:   reports that he has never smoked. He has never used smokeless tobacco. He reports that he does not drink alcohol and does not use drugs.  ALLERGIES:  No Known Allergies  ROS: Pertinent items noted in HPI and remainder of comprehensive ROS otherwise negative.  HOME MEDS: Current Outpatient Medications on File Prior to Visit  Medication Sig Dispense Refill  . alfuzosin (UROXATRAL) 10 MG 24 hr tablet  Take 1 tablet (10 mg total) by mouth daily. 30 tablet 2  . Ascorbic Acid (VITAMIN C) 500 MG CAPS Take 500 mg by mouth daily.     [name] aspirin EC 81 MG tablet Take 1 tablet (81 mg total) by mouth daily.    [name] atorvastatin (LIPITOR) 40 MG tablet TAKE 1 TABLET BY MOUTH AT BEDTIME 90 tablet 1  . b complex vitamins tablet Take 1 tablet by mouth daily.     . cholecalciferol (VITAMIN D3) 25 MCG (1000 UNIT) tablet Take 2 each by mouth daily.     [name] CINNAMON PO Take 1 tablet by mouth daily. 1000mg  daily    . Coenzyme Q10 (COQ10) 200 MG CAPS Take 1 tablet by mouth daily.     . empagliflozin (JARDIANCE) 25 MG TABS tablet Take 1 tablet (25 mg total) by mouth daily before breakfast. (Patient taking differently: Take 25 mg by mouth every other day.) 30 tablet 2  . FLUoxetine (PROZAC) 40 MG capsule Take 1 capsule (40 mg total) by mouth every morning. 90 capsule 1  . Garlic 6578 MG CAPS Take 1 tablet by mouth daily.     [name] glucose blood test strip Use to check blood sugars daily 3 times daily  E11.65 200 each 3  . insulin degludec (TRESIBA FLEXTOUCH) 100 UNIT/ML FlexTouch Pen Inject 50 Units into the skin daily. 15 mL 0  . Insulin Pen Needle (PEN NEEDLES) 32G X 4 MM MISC Use as directed 35 each 0  . lamoTRIgine (LAMICTAL) 150 MG tablet Take 1 tablet (150 mg total) by mouth 2 (two) times daily. 180 tablet 1  . LORazepam (ATIVAN) 1 MG tablet TAKE 1 TABLET BY MOUTH THREE TIMES DAILY AS NEEDED FOR ANXIETY. GENERIC EQUIVALENT FOR ATIVAN. 270 tablet 1  . losartan (COZAAR) 100 MG tablet Take 1 tablet (100 mg total) by mouth daily. 90 tablet 3  . metFORMIN (GLUCOPHAGE) 1000 MG tablet TAKE 1 TABLET BY MOUTH 2 TIMES DAILY WITH MEALS 180 tablet 1  . metoprolol tartrate (LOPRESSOR) 25 MG tablet TAKE 1 TABLET BY MOUTH 2 TIMES DAILY. GENERIC EQUIVALENT FOR LOPRESSOR. 180 tablet 3  . Multiple Vitamin (MULTIVITAMIN WITH MINERALS) TABS tablet Take 1 tablet by mouth daily.    . Omega-3 Fatty Acids (FISH OIL) 1000 MG CAPS Take 2  capsules by mouth daily.    [name] omeprazole (PRILOSEC) 40 MG capsule TAKE 1 CAPSULE BY MOUTH 20 MINUTES BEFORE BREAKFAST. 90 capsule 0  . Semaglutide (RYBELSUS) 14 MG TABS Take 14 mg by mouth daily at 6 (six) AM. 30 tablet 0  . vitamin B-12 (CYANOCOBALAMIN) 500 MCG tablet Take 500 mcg by mouth daily.     No current facility-administered medications on file prior to visit.    LABS/IMAGING: No results found for this or any previous visit (from the past 48 hour(s)). No results found.  LIPID PANEL:  Component Value Date/Time   CHOL 191 06/30/2020 1550   TRIG 375 (H) 06/30/2020 1550   HDL 35 (L) 06/30/2020 1550   CHOLHDL 5.5 (H) 06/30/2020 1550   CHOLHDL 7.1 07/26/2015 0647   VLDL UNABLE TO CALCULATE IF TRIGLYCERIDE OVER 400 mg/dL 07/26/2015 0647   LDLCALC 94 06/30/2020 1550   LDLDIRECT 124.3 11/10/2010 0953    WEIGHTS: Wt Readings from Last 3 Encounters:  09/15/20 237 lb 6.4 oz (107.7 kg)  06/30/20 232 lb (105.2 kg)  03/26/20 228 lb (103.4 kg)    VITALS: BP 130/76   Pulse (!) 104   Ht 5\' 9"  (1.753 m)   Wt 237 lb 6.4 oz (107.7 kg)   SpO2 94%   BMI 35.06 kg/m   EXAM: Deferred  EKG: deferred  ASSESSMENT: 1. Hypertriglyceridemia 2. Uncontrolled DM2 3. Chronic diastolic CHF 4. Obesity  PLAN: 1.   Robert Walters has had some improvement in his lipids although triglycerides remain elevated.  I advised him to increase his fish oil from 1 to 2 g a day for additional benefit.  I would not recommend fibrate because of moderate chronic kidney disease.  He does seem like he takes a lot of NSAIDs, namely naproxen daily in addition to his aspirin which might be affecting his kidneys negatively.  Plan repeat lipids in about 6 months and follow-up at that time  Pixie Casino, MD, [facility], [place] Director of the [facility] Lip[facility]  Cardiovascular [facility] Reduction [facility] Diplomate of the American Board of Clinical Lipidology Attending  Cardiologist  Direct Dial: [phone]  Fax: [phone]  Website:  [email or web address] [email or web address] Khylee Algeo 09/15/2020, 1:18 PM

## 2020-09-23 ENCOUNTER — Other Ambulatory Visit: Payer: Self-pay

## 2020-09-23 MED ORDER — ATORVASTATIN CALCIUM 40 MG PO TABS: 1.0000 | ORAL_TABLET | Freq: Every day | ORAL | 1 refills | Status: DC

## 2020-09-30 ENCOUNTER — Other Ambulatory Visit: Payer: Self-pay

## 2020-09-30 ENCOUNTER — Ambulatory Visit (INDEPENDENT_AMBULATORY_CARE_PROVIDER_SITE_OTHER): Payer: Medicare Other | Admitting: Nurse Practitioner

## 2020-09-30 ENCOUNTER — Encounter: Payer: Self-pay | Admitting: Nurse Practitioner

## 2020-09-30 VITALS — BP 132/76 | HR 99 | Temp 98.3°F | Ht 69.0 in | Wt 235.0 lb

## 2020-09-30 DIAGNOSIS — E1169 Type 2 diabetes mellitus with other specified complication: Secondary | ICD-10-CM | POA: Diagnosis not present

## 2020-09-30 DIAGNOSIS — F3181 Bipolar II disorder: Secondary | ICD-10-CM

## 2020-09-30 DIAGNOSIS — I10 Essential (primary) hypertension: Secondary | ICD-10-CM | POA: Diagnosis not present

## 2020-09-30 DIAGNOSIS — E6609 Other obesity due to excess calories: Secondary | ICD-10-CM

## 2020-09-30 DIAGNOSIS — E119 Type 2 diabetes mellitus without complications: Secondary | ICD-10-CM

## 2020-09-30 DIAGNOSIS — I700 Atherosclerosis of aorta: Secondary | ICD-10-CM

## 2020-09-30 DIAGNOSIS — L03114 Cellulitis of left upper limb: Secondary | ICD-10-CM

## 2020-09-30 DIAGNOSIS — Z6834 Body mass index (BMI) 34.0-34.9, adult: Secondary | ICD-10-CM

## 2020-09-30 DIAGNOSIS — E66811 Other obesity due to excess calories: Secondary | ICD-10-CM

## 2020-09-30 DIAGNOSIS — E782 Mixed hyperlipidemia: Secondary | ICD-10-CM | POA: Diagnosis not present

## 2020-09-30 DIAGNOSIS — Z23 Encounter for immunization: Secondary | ICD-10-CM

## 2020-09-30 LAB — LIPID PANEL
Chol/HDL Ratio: 4.3 ratio (ref 0.0–5.0)
Cholesterol, Total: 150 mg/dL (ref 100–199)
HDL: 35 mg/dL — ABNORMAL LOW (ref >39)
LDL Chol Calc (NIH): 69 mg/dL (ref 0–99)
Triglycerides: 287 mg/dL — ABNORMAL HIGH (ref 0–149)
VLDL Cholesterol Cal: 46 mg/dL — ABNORMAL HIGH (ref 5–40)

## 2020-09-30 LAB — CMP14+EGFR
ALT: 57 IU/L — ABNORMAL HIGH (ref 0–44)
AST: 42 IU/L — ABNORMAL HIGH (ref 0–40)
Albumin/Globulin Ratio: 2 (ref 1.2–2.2)
Albumin: 4.9 g/dL — ABNORMAL HIGH (ref 3.8–4.8)
Alkaline Phosphatase: 159 IU/L — ABNORMAL HIGH (ref 44–121)
BUN/Creatinine Ratio: 17 (ref 10–24)
BUN: 27 mg/dL (ref 8–27)
Bilirubin Total: 0.5 mg/dL (ref 0.0–1.2)
CO2: 20 mmol/L (ref 20–29)
Calcium: 9.9 mg/dL (ref 8.6–10.2)
Chloride: 104 mmol/L (ref 96–106)
Creatinine, Ser: 1.56 mg/dL — ABNORMAL HIGH (ref 0.76–1.27)
Globulin, Total: 2.5 g/dL (ref 1.5–4.5)
Glucose: 149 mg/dL — ABNORMAL HIGH (ref 65–99)
Potassium: 5.4 mmol/L — ABNORMAL HIGH (ref 3.5–5.2)
Sodium: 144 mmol/L (ref 134–144)
Total Protein: 7.4 g/dL (ref 6.0–8.5)
eGFR: 49 mL/min/{1.73_m2} — ABNORMAL LOW (ref >59)

## 2020-09-30 LAB — HEMOGLOBIN A1C
Est. average glucose Bld gHb Est-mCnc: 157 mg/dL
Hgb A1c MFr Bld: 7.1 % — ABNORMAL HIGH (ref 4.8–5.6)

## 2020-09-30 MED ORDER — CEPHALEXIN 500 MG PO CAPS: 500.0000 mg | ORAL_CAPSULE | Freq: Four times a day (QID) | ORAL | 0 refills | Status: AC

## 2020-09-30 MED ORDER — TETANUS-DIPHTH-ACELL PERTUSSIS 5-2.5-18.5 LF-MCG/0.5 IM SUSP: 0.5000 mL | Freq: Once | INTRAMUSCULAR | 0 refills | Status: AC

## 2020-09-30 NOTE — Patient Instructions (Signed)
Diabetes Mellitus and Nutrition, Adult When you have diabetes, or diabetes mellitus, it is very important to have healthy eating habits because your blood sugar (glucose) levels are greatly affected by what you eat and drink. Eating healthy foods in the right amounts, at about the same times every day, can help you:  Control your blood glucose.  Lower your risk of heart disease.  Improve your blood pressure.  Reach or maintain a healthy weight. What can affect my meal plan? Every person with diabetes is different, and each person has different needs for a meal plan. Your health care provider may recommend that you work with a dietitian to make a meal plan that is best for you. Your meal plan may vary depending on factors such as:  The calories you need.  The medicines you take.  Your weight.  Your blood glucose, blood pressure, and cholesterol levels.  Your activity level.  Other health conditions you have, such as heart or kidney disease. How do carbohydrates affect me? Carbohydrates, also called carbs, affect your blood glucose level more than any other type of food. Eating carbs naturally raises the amount of glucose in your blood. Carb counting is a method for keeping track of how many carbs you eat. Counting carbs is important to keep your blood glucose at a healthy level, especially if you use insulin or take certain oral diabetes medicines. It is important to know how many carbs you can safely have in each meal. This is different for every person. Your dietitian can help you calculate how many carbs you should have at each meal and for each snack. How does alcohol affect me? Alcohol can cause a sudden decrease in blood glucose (hypoglycemia), especially if you use insulin or take certain oral diabetes medicines. Hypoglycemia can be a life-threatening condition. Symptoms of hypoglycemia, such as sleepiness, dizziness, and confusion, are similar to symptoms of having too much  alcohol.  Do not drink alcohol if: ? Your health care provider tells you not to drink. ? You are pregnant, may be pregnant, or are planning to become pregnant.  If you drink alcohol: ? Do not drink on an empty stomach. ? Limit how much you use to:  0-1 drink a day for women.  0-2 drinks a day for men. ? Be aware of how much alcohol is in your drink. In the [place] one drink equals one 12 oz bottle of beer (355 mL), one 5 oz glass of wine (148 mL), or one 1 oz glass of hard liquor (44 mL). ? Keep yourself hydrated with water, diet soda, or unsweetened iced tea.  Keep in mind that regular soda, juice, and other mixers may contain a lot of sugar and must be counted as carbs. What are tips for following this plan? Reading food labels  Start by checking the serving size on the "Nutrition Facts" label of packaged foods and drinks. The amount of calories, carbs, fats, and other nutrients listed on the label is based on one serving of the item. Many items contain more than one serving per package.  Check the total grams (g) of carbs in one serving. You can calculate the number of servings of carbs in one serving by dividing the total carbs by 15. For example, if a food has 30 g of total carbs per serving, it would be equal to 2 servings of carbs.  Check the number of grams (g) of saturated fats and trans fats in one serving. Choose foods that have   a low amount or none of these fats.  Check the number of milligrams (mg) of salt (sodium) in one serving. Most people should limit total sodium intake to less than 2,300 mg per day.  Always check the nutrition information of foods labeled as "low-fat" or "nonfat." These foods may be higher in added sugar or refined carbs and should be avoided.  Talk to your dietitian to identify your daily goals for nutrients listed on the label. Shopping  Avoid buying canned, pre-made, or processed foods. These foods tend to be high in fat, sodium, and added  sugar.  Shop around the outside edge of the grocery store. This is where you will most often find fresh fruits and vegetables, bulk grains, fresh meats, and fresh dairy. Cooking  Use low-heat cooking methods, such as baking, instead of high-heat cooking methods like deep frying.  Cook using healthy oils, such as olive, canola, or sunflower oil.  Avoid cooking with butter, cream, or high-fat meats. Meal planning  Eat meals and snacks regularly, preferably at the same times every day. Avoid going long periods of time without eating.  Eat foods that are high in fiber, such as fresh fruits, vegetables, beans, and whole grains. Talk with your dietitian about how many servings of carbs you can eat at each meal.  Eat 4-6 oz (112-168 g) of lean protein each day, such as lean meat, chicken, fish, eggs, or tofu. One ounce (oz) of lean protein is equal to: ? 1 oz (28 g) of meat, chicken, or fish. ? 1 egg. ?  cup (62 g) of tofu.  Eat some foods each day that contain healthy fats, such as avocado, nuts, seeds, and fish.   What foods should I eat? Fruits Berries. Apples. Oranges. Peaches. Apricots. Plums. Grapes. Mango. Papaya. Pomegranate. Kiwi. Cherries. Vegetables Lettuce. Spinach. Leafy greens, including kale, chard, collard greens, and mustard greens. Beets. Cauliflower. Cabbage. Broccoli. Carrots. Green beans. Tomatoes. Peppers. Onions. Cucumbers. Brussels sprouts. Grains Whole grains, such as whole-wheat or whole-grain bread, crackers, tortillas, cereal, and pasta. Unsweetened oatmeal. Quinoa. Brown or wild rice. Meats and other proteins Seafood. Poultry without skin. Lean cuts of poultry and beef. Tofu. Nuts. Seeds. Dairy Low-fat or fat-free dairy products such as milk, yogurt, and cheese. The items listed above may not be a complete list of foods and beverages you can eat. Contact a dietitian for more information. What foods should I avoid? Fruits Fruits canned with  syrup. Vegetables Canned vegetables. Frozen vegetables with butter or cream sauce. Grains Refined white flour and flour products such as bread, pasta, snack foods, and cereals. Avoid all processed foods. Meats and other proteins Fatty cuts of meat. Poultry with skin. Breaded or fried meats. Processed meat. Avoid saturated fats. Dairy Full-fat yogurt, cheese, or milk. Beverages Sweetened drinks, such as soda or iced tea. The items listed above may not be a complete list of foods and beverages you should avoid. Contact a dietitian for more information. Questions to ask a health care provider  Do I need to meet with a diabetes educator?  Do I need to meet with a dietitian?  What number can I call if I have questions?  When are the best times to check my blood glucose? Where to find more information:  American Diabetes Association: diabetes.org  Academy of Nutrition and Dietetics: www.eatright.org  National Institute of Diabetes and Digestive and Kidney Diseases: www.niddk.nih.gov  Association of Diabetes Care and Education Specialists: www.diabeteseducator.org Summary  It is important to have healthy eating   habits because your blood sugar (glucose) levels are greatly affected by what you eat and drink.  A healthy meal plan will help you control your blood glucose and maintain a healthy lifestyle.  Your health care provider may recommend that you work with a dietitian to make a meal plan that is best for you.  Keep in mind that carbohydrates (carbs) and alcohol have immediate effects on your blood glucose levels. It is important to count carbs and to use alcohol carefully. This information is not intended to replace advice given to you by your health care provider. Make sure you discuss any questions you have with your health care provider. Document Revised: 03/27/2019 Document Reviewed: 03/27/2019 Elsevier Patient Education  2021 [employer].  

## 2020-09-30 NOTE — Progress Notes (Signed)
I,Tianna Badgett,acting as a Education administrator for [employer], FNP.,have documented all relevant documentation on the behalf of [name], FNP,as directed by  [name], FNP while in the presence of [name], [place].  This visit occurred during the SARS-CoV-2 public health emergency.  Safety protocols were in place, including screening questions prior to the visit, additional usage of staff PPE, and extensive cleaning of exam room while observing appropriate contact time as indicated for disinfecting solutions.  Subjective:     Patient ID: Robert Walters , male    DOB: 13-Mar-1956 , 65 y.o.   MRN: [id number]   Chief Complaint  Patient presents with   Diabetes   Hypertension    HPI   Pt is here today to follow up on d/m he doesn't have any other concerns going on. He has 7 mg rybelsus at home and now has the 14 mg rybelsus.   He seen Dr. Debara Pickett for his elevated triglycerides he was told to take 2 fish oil his levels have improved.   Diabetes He presents for his follow-up diabetic visit. He has type 2 diabetes mellitus. His disease course has been stable. There are no hypoglycemic associated symptoms. Pertinent negatives for hypoglycemia include no dizziness or headaches. There are no diabetic associated symptoms. Pertinent negatives for diabetes include no chest pain, no fatigue, no polydipsia, no polyphagia and no polyuria. There are no hypoglycemic complications. Symptoms are stable. Diabetic complications include heart disease. Risk factors for coronary artery disease include diabetes mellitus, sedentary lifestyle, male sex and obesity. Current diabetic treatment includes oral agent (monotherapy). He is compliant with treatment all of the time. He is following a generally unhealthy diet. He has not had a previous visit with a dietitian. He rarely participates in exercise. (This morning was 165. Has been as low as 135 before he started taking 7 mg. ) An ACE inhibitor/angiotensin II receptor  blocker is being taken. He does not see a podiatrist.Eye exam is current.  Hypertension This is a chronic problem. The current episode started more than 1 year ago. The problem is unchanged. The problem is controlled. Pertinent negatives include no anxiety, chest pain, headaches or palpitations. There are no associated agents to hypertension. Risk factors for coronary artery disease include obesity and sedentary lifestyle. There are no compliance problems.  There is no history of angina. There is no history of chronic renal disease.    Past Medical History:  Diagnosis Date   Acid reflux disease    Bipolar 1 disorder, mixed (HCC)    BPH (benign prostatic hypertrophy)    CVA (cerebral vascular accident) ([place]) 07/2015   Gastritis    H/O hiatal hernia    Hyperlipidemia    Hypertension    Normal nuclear stress test Dec 2011   Obesity    TIA (transient ischemic attack) 05/15/2012   "they think I've had one this am @ 0130" (05/15/2012)   Type II diabetes mellitus ([place])    Vertigo      Family History  Problem Relation Age of Onset   Hyperlipidemia Father        in a nursing home   Colon cancer Father    Stroke Mother 12     Current Outpatient Medications:    alfuzosin (UROXATRAL) 10 MG 24 hr tablet, Take 1 tablet (10 mg total) by mouth daily., Disp: 30 tablet, Rfl: 2   Ascorbic Acid (VITAMIN C) 500 MG CAPS, Take 500 mg by mouth daily. , Disp: , Rfl:  aspirin EC 81 MG tablet, Take 1 tablet (81 mg total) by mouth daily., Disp: , Rfl:    atorvastatin (LIPITOR) 40 MG tablet, Take 1 tablet (40 mg total) by mouth at bedtime., Disp: 90 tablet, Rfl: 1   b complex vitamins tablet, Take 1 tablet by mouth daily. , Disp: , Rfl:    cholecalciferol (VITAMIN D3) 25 MCG (1000 UNIT) tablet, Take 2 each by mouth daily. , Disp: , Rfl:    CINNAMON PO, Take 1 tablet by mouth daily. 1042m daily, Disp: , Rfl:    Coenzyme Q10 (COQ10) 200 MG CAPS, Take 1 tablet by mouth daily. , Disp: , Rfl:    empagliflozin  (JARDIANCE) 25 MG TABS tablet, Take 1 tablet (25 mg total) by mouth daily before breakfast. (Patient taking differently: Take 25 mg by mouth every other day.), Disp: 30 tablet, Rfl: 2   FLUoxetine (PROZAC) 40 MG capsule, Take 1 capsule (40 mg total) by mouth every morning., Disp: 90 capsule, Rfl: 1   Garlic 17616MG CAPS, Take 1 tablet by mouth daily. , Disp: , Rfl:    glucose blood test strip, Use to check blood sugars daily 3 times daily  E11.65, Disp: 200 each, Rfl: 3   insulin degludec (TRESIBA FLEXTOUCH) 100 UNIT/ML FlexTouch Pen, Inject 50 Units into the skin daily., Disp: 15 mL, Rfl: 0   Insulin Pen Needle (PEN NEEDLES) 32G X 4 MM MISC, Use as directed, Disp: 35 each, Rfl: 0   lamoTRIgine (LAMICTAL) 150 MG tablet, Take 1 tablet (150 mg total) by mouth 2 (two) times daily., Disp: 180 tablet, Rfl: 1   LORazepam (ATIVAN) 1 MG tablet, TAKE 1 TABLET BY MOUTH THREE TIMES DAILY AS NEEDED FOR ANXIETY. GENERIC EQUIVALENT FOR ATIVAN., Disp: 270 tablet, Rfl: 1   losartan (COZAAR) 100 MG tablet, Take 1 tablet (100 mg total) by mouth daily., Disp: 90 tablet, Rfl: 3   metFORMIN (GLUCOPHAGE) 1000 MG tablet, TAKE 1 TABLET BY MOUTH 2 TIMES DAILY WITH MEALS, Disp: 180 tablet, Rfl: 1   metoprolol tartrate (LOPRESSOR) 25 MG tablet, TAKE 1 TABLET BY MOUTH 2 TIMES DAILY. GENERIC EQUIVALENT FOR LOPRESSOR., Disp: 180 tablet, Rfl: 3   Multiple Vitamin (MULTIVITAMIN WITH MINERALS) TABS tablet, Take 1 tablet by mouth daily., Disp: , Rfl:    Omega-3 Fatty Acids (FISH OIL) 1000 MG CAPS, Take 2 capsules by mouth daily., Disp: , Rfl:    omeprazole (PRILOSEC) 40 MG capsule, TAKE 1 CAPSULE BY MOUTH 20 MINUTES BEFORE BREAKFAST., Disp: 90 capsule, Rfl: 0   Semaglutide (RYBELSUS) 14 MG TABS, Take 14 mg by mouth daily at 6 (six) AM., Disp: 30 tablet, Rfl: 0   vitamin B-12 (CYANOCOBALAMIN) 500 MCG tablet, Take 500 mcg by mouth daily., Disp: , Rfl:    No Known Allergies   Review of Systems  Constitutional: Negative.  Negative  for fatigue.  Respiratory: Negative.    Cardiovascular: Negative.  Negative for chest pain, palpitations and leg swelling.  Gastrointestinal: Negative.   Endocrine: Negative for polydipsia, polyphagia and polyuria.  Neurological: Negative.  Negative for dizziness and headaches.  Psychiatric/Behavioral: Negative.      Today's Vitals   09/30/20 1004  BP: 132/76  Pulse: 99  Temp: 98.3 F (36.8 C)  TempSrc: Oral  Weight: 235 lb (106.6 kg)  Height: 5' 9" (1.753 m)   Body mass index is 34.7 kg/m.   Objective:  Physical Exam Vitals reviewed.  Constitutional:      General: He is not in acute distress.  Appearance: Normal appearance. He is obese.  Eyes:     Extraocular Movements: Extraocular movements intact.     Conjunctiva/sclera: Conjunctivae normal.     Pupils: Pupils are equal, round, and reactive to light.  Cardiovascular:     Rate and Rhythm: Normal rate and regular rhythm.     Pulses: Normal pulses.     Heart sounds: Normal heart sounds. No murmur heard. Pulmonary:     Effort: Pulmonary effort is normal. No respiratory distress.     Breath sounds: Normal breath sounds. No wheezing.  Skin:    General: Skin is warm and dry.     Capillary Refill: Capillary refill takes less than 2 seconds.     Comments: Left anterior wrist with scabbed and reddened area.   Neurological:     General: No focal deficit present.     Mental Status: He is alert and oriented to person, place, and time.     Cranial Nerves: No cranial nerve deficit.  Psychiatric:        Mood and Affect: Mood normal.        Behavior: Behavior normal.        Thought Content: Thought content normal.        Judgment: Judgment normal.        Assessment And Plan:     1. Essential hypertension Chronic, fair control Continue with medications - Hemoglobin A1c  2. Type 2 diabetes mellitus without complication, without long-term current use of insulin (HCC) Chronic, improving  Continue with Rybelsus and  [name] Will check HgbA1c - Hemoglobin A1c  3. Mixed hyperlipidemia Chronic, controlled Continue with current medications, tolerating medications well - Lipid panel - CMP14+EGFR  4. Class 1 obesity due to excess calories with serious comorbidity and body mass index (BMI) of 34.0 to 34.9 in adult Chronic, weight is stable Continue with eating a healthier diet  5. Bipolar II disorder (HCC) Chronic, stable Continue follow up with behavioral health  6. Encounter for immunization Rx sent to pharmacy. - Tdap (BOOSTRIX) 5-2.5-18.5 LF-MCG/0.5 injection; Inject 0.5 mLs into the muscle once for 1 dose.  Dispense: 0.5 mL; Refill: 0  7. Cellulitis of left upper extremity Has erythema to left upper extremity at medial wrist with scabbing concerning for cellulitis Will treat with keflex - cephALEXin (KEFLEX) 500 MG capsule; Take 1 capsule (500 mg total) by mouth 4 (four) times daily for 10 days.  Dispense: 40 capsule; Refill: 0  8. Atherosclerosis of aorta ([place]) Continue with statin    Patient was given opportunity to ask questions. Patient verbalized understanding of the plan and was able to repeat key elements of the plan. All questions were answered to their satisfaction.  [name], FNP    I, [name], FNP, have reviewed all documentation for this visit. The documentation on 09/30/20 for the exam, diagnosis, procedures, and orders are all accurate and complete.   IF YOU HAVE BEEN REFERRED TO A SPECIALIST, IT MAY TAKE 1-2 WEEKS TO SCHEDULE/PROCESS THE REFERRAL. IF YOU HAVE NOT HEARD FROM US/SPECIALIST IN TWO WEEKS, PLEASE GIVE [place] A CALL AT [phone].   THE PATIENT IS ENCOURAGED TO PRACTICE SOCIAL DISTANCING DUE TO THE COVID-19 PANDEMIC.

## 2020-10-02 ENCOUNTER — Ambulatory Visit (INDEPENDENT_AMBULATORY_CARE_PROVIDER_SITE_OTHER): Payer: Medicare Other

## 2020-10-02 ENCOUNTER — Telehealth: Payer: Medicare Other

## 2020-10-02 DIAGNOSIS — N1831 Chronic kidney disease, stage 3a: Secondary | ICD-10-CM

## 2020-10-02 DIAGNOSIS — E119 Type 2 diabetes mellitus without complications: Secondary | ICD-10-CM

## 2020-10-02 DIAGNOSIS — I10 Essential (primary) hypertension: Secondary | ICD-10-CM

## 2020-10-02 DIAGNOSIS — E782 Mixed hyperlipidemia: Secondary | ICD-10-CM | POA: Diagnosis not present

## 2020-10-03 NOTE — Patient Instructions (Signed)
Goals Addressed    . Chronic Kidney Disease - Disease Progression Prevented or Minimized   On track    Timeframe:  Long-Range Goal Priority:  Medium Start Date:  07/01/20                           Expected End Date:  07/01/21  Next Follow Up Date: 01/02/21  . Continue to adhere to MD recommendations for CKD  . Continue to keep all scheduled follow up appointments . Take medications as directed  . Let your healthcare team know if you are unable to take your medications . Call your pharmacy for refills at least 7 days prior to running out of medication . Increase your water intake unless otherwise directed . Review mailed printed educational materials related to kidney disease                    . Glycemic Management Optimized   On track    Timeframe:  Long-Range Goal Priority:  Medium Start Date: 07/01/20                            Expected End Date: 07/01/21                      Next Follow Up Date: 01/02/21   - Self administer medications as prescribed - Attend all scheduled provider appointments - Call pharmacy for medication refills - Call provider office for new concerns or questions - Continue to monitor CBG's at home as discussed - Continue to adhere to dietary and exercise recommendations as discussed  - Review DM patient educational materials to discuss at next RN CM f/u call  - work with embedded Pharm D regarding medication dosing/financial concerns     . Mixed Hyperlipidemia - Disease Progression minimized or avoided   On track    Timeframe:  Long-Range Goal Priority:  High Start Date:  07/01/20                           Expected End Date:  07/01/21  Next Follow Up Date: 01/02/21  - Self administer medications as prescribed - Attend all scheduled provider appointments - Call pharmacy for medication refills - Adhere to heart healthy diet as discussed

## 2020-10-03 NOTE — Chronic Care Management (AMB) (Addendum)
Chronic Care Management   CCM RN Visit Note  10/02/2020 Name: GRAVES NIPP MRN: [id number] DOB: Oct 09, 1955  Subjective: Robert Walters is a 65 y.o. year old male who is a primary care patient of [name], [place]. The care management team was consulted for assistance with disease management and care coordination needs.    Engaged with patient by telephone for follow up visit in response to provider referral for case management and/or care coordination services.   Consent to Services:  The patient was given information about Chronic Care Management services, agreed to services, and gave verbal consent prior to initiation of services.  Please see initial visit note for detailed documentation.   Patient agreed to services and verbal consent obtained.   Assessment: Review of patient past medical history, allergies, medications, health status, including review of consultants reports, laboratory and other test data, was performed as part of comprehensive evaluation and provision of chronic care management services.   SDOH (Social Determinants of Health) assessments and interventions performed:  Yes, no challenges identified   CCM Care Plan  No Known Allergies  Outpatient Encounter Medications as of 10/02/2020  Medication Sig  . alfuzosin (UROXATRAL) 10 MG 24 hr tablet Take 1 tablet (10 mg total) by mouth daily.  . Ascorbic Acid (VITAMIN C) 500 MG CAPS Take 500 mg by mouth daily.   [name] aspirin EC 81 MG tablet Take 1 tablet (81 mg total) by mouth daily.  [name] atorvastatin (LIPITOR) 40 MG tablet Take 1 tablet (40 mg total) by mouth at bedtime.  [name] b complex vitamins tablet Take 1 tablet by mouth daily.   . cephALEXin (KEFLEX) 500 MG capsule Take 1 capsule (500 mg total) by mouth 4 (four) times daily for 10 days.  . cholecalciferol (VITAMIN D3) 25 MCG (1000 UNIT) tablet Take 2 each by mouth daily.   [name] CINNAMON PO Take 1 tablet by mouth daily. 1066m daily  . Coenzyme Q10 (COQ10) 200 MG CAPS Take 1  tablet by mouth daily.   . empagliflozin (JARDIANCE) 25 MG TABS tablet Take 1 tablet (25 mg total) by mouth daily before breakfast. (Patient taking differently: Take 25 mg by mouth every other day.)  . FLUoxetine (PROZAC) 40 MG capsule Take 1 capsule (40 mg total) by mouth every morning.  . Garlic 19326MG CAPS Take 1 tablet by mouth daily.   .Marland Kitchenglucose blood test strip Use to check blood sugars daily 3 times daily  E11.65  . insulin degludec (TRESIBA FLEXTOUCH) 100 UNIT/ML FlexTouch Pen Inject 50 Units into the skin daily.  . Insulin Pen Needle (PEN NEEDLES) 32G X 4 MM MISC Use as directed  . lamoTRIgine (LAMICTAL) 150 MG tablet Take 1 tablet (150 mg total) by mouth 2 (two) times daily.  .Marland KitchenLORazepam (ATIVAN) 1 MG tablet TAKE 1 TABLET BY MOUTH THREE TIMES DAILY AS NEEDED FOR ANXIETY. GENERIC EQUIVALENT FOR ATIVAN.  .Marland Kitchenlosartan (COZAAR) 100 MG tablet Take 1 tablet (100 mg total) by mouth daily.  . metFORMIN (GLUCOPHAGE) 1000 MG tablet TAKE 1 TABLET BY MOUTH 2 TIMES DAILY WITH MEALS  . metoprolol tartrate (LOPRESSOR) 25 MG tablet TAKE 1 TABLET BY MOUTH 2 TIMES DAILY. GENERIC EQUIVALENT FOR LOPRESSOR.  . Multiple Vitamin (MULTIVITAMIN WITH MINERALS) TABS tablet Take 1 tablet by mouth daily.  . Omega-3 Fatty Acids (FISH OIL) 1000 MG CAPS Take 2 capsules by mouth daily.  .Marland Kitchenomeprazole (PRILOSEC) 40 MG capsule TAKE 1 CAPSULE BY MOUTH 20 MINUTES BEFORE BREAKFAST.  .Marland KitchenSemaglutide (  RYBELSUS) 14 MG TABS Take 14 mg by mouth daily at 6 (six) AM.  . vitamin B-12 (CYANOCOBALAMIN) 500 MCG tablet Take 500 mcg by mouth daily.   No facility-administered encounter medications on file as of 10/02/2020.    Patient Active Problem List   Diagnosis Date Noted  . Chronic diastolic heart failure ([place]) 03/27/2019  . Fatty liver 12/26/2018  . Elevated LFTs 12/26/2018  . History of colonic polyps 12/26/2018  . Fall 08/28/2018  . Rib pain on left side 08/28/2018  . Anxiety 03/31/2018  . Bipolar II disorder ([place]) 03/31/2018   . Insomnia 03/31/2018  . Type 2 diabetes mellitus without complication, without long-term current use of insulin ([place]) 03/24/2018  . Bile leak 09/15/2016  . Sepsis ([place]) 08/23/2016  . Cholecystitis   . Stroke ([place]) 07/25/2015  . 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 Dizziness 08/09/2014  . TIA (transient ischemic attack) 05/15/2012  . Blurred vision 05/15/2012  . Ataxia 05/15/2012  . Diabetes mellitus type 2 with complications ([place]) 37/02/6268  . BPH (benign prostatic hyperplasia) 05/15/2012  . GERD (gastroesophageal reflux disease) 05/15/2012  . Obesity 05/15/2012  . Bipolar 1 disorder ([place]) 05/15/2012  . Essential hypertension 10/30/2010  . Cardiovascular risk factor 10/30/2010  . Pure hypercholesterolemia 12/05/2008    Conditions to be addressed/monitored:DMII, Essential Hypertension, Stage 3a CKD, Mixed Hyperlipidemia   Care Plan : Chronic Kidney (Adult)  Updates made by Lynne Logan, RN since 10/02/2020 12:00 AM    Problem: Disease Progression   Priority: Medium    Long-Range Goal: Disease Progression Prevented or Minimized   Start Date: 07/01/2020  Expected End Date: 07/01/2021  Recent Progress: On track  Priority: Medium  Note:   CARE PLAN ENTRY (see longitudinal plan of care for additional care plan information)  Current Barriers:  [name] Knowledge Deficits related to disease process and Self Health management of CKD . Chronic Disease Management support and education needs related to DMII, Essential Hypertension, Stage 3a CKD, Mixed Hyperlipidemia  Nurse Case Manager Clinical Goal(s):  [name] Patient will work with the CCM RN CM and PCP to address needs related to disease education and support to improve CKD as evidence by disease progression will be minimized or avoided  CCM RN CM Interventions:  10/02/20 successful outbound call completed with patient  . Inter-disciplinary care team collaboration (see longitudinal plan of care) . Evaluation of current treatment plan  related to CKD and patient's adherence to plan as established by provider. . Provided education to patient re: kidney decline with recent GFR < 49; Educated patient on stages of kidney disease and interventions to help improve kidney function, including improved diabetic control, increasing water and exercise   . Mailed printed educational material related to Stages of Chronic Kidney disease  . Discussed plans with patient for ongoing care management follow up and provided patient with direct contact information for care management team Patient Self Care Activities:  . Continue to adhere to MD recommendations for CKD  . Continue to keep all scheduled follow up appointments . Take medications as directed  . Let your healthcare team know if you are unable to take your medications . Call your pharmacy for refills at least 7 days prior to running out of medication . Increase your water intake unless otherwise directed . Review mailed printed educational materials related to kidney disease   Next Scheduled Follow Up Call: 01/02/21   Care Plan : Diabetes Type 2 (Adult)  Updates made by Lynne Logan, RN  since 10/03/2020 12:00 AM    Problem: Glycemic Management (Diabetes, Type 2)   Priority: Medium    Long-Range Goal: Glycemic Management Optimized   Start Date: 07/01/2020  Expected End Date: 07/01/2021  Recent Progress: On track  Priority: Medium  Note:   CARE PLAN ENTRY (see longitudinal plan of care for additional care plan information) Objective:  Lab Results  Component Value Date   HGBA1C 7.1 (H) 09/30/2020 .   Lab Results  Component Value Date   CREATININE 1.56 (H) 09/30/2020   CREATININE 1.50 (H) 06/30/2020   CREATININE 1.45 (H) 03/26/2020 .   Lab Results  Component Value Date   EGFR 49 (L) 09/30/2020 .   Current Barriers:  [name] Knowledge Deficits related to disease process and Self Health management of DM  . Chronic Disease Management support and education needs related to DMII,  Essential Hypertension, Stage 3a CKD, Mixed Hyperlipidemia  Nurse Case Manager Clinical Goal(s):  [name] Patient will work with CCM RN CM and PCP to address needs related to disease education and support to improve Self Health management of DM as evidence by patient will lower and maintain A1c <7.0 CCM RN CM Interventions:  10/02/20 completed successful outbound call with patient  . Inter-disciplinary care team collaboration (see longitudinal plan of care) . Evaluation of current treatment plan related to DM and patient's adherence to plan as established by provider. . Reviewed current A1c is elevated to 7.1; Educated on basic disease process including complications if uncontrolled; Educated on dietary and exercise recommendations, Provided recommendations for monitoring CBG twice daily before meals and at bedtime and logging BS; Educated on daily glycemic control FBS 80-130; <180 after meals; Educated on importance of implementing exercise to daily routine (150 min weekly), maintaining healthy weight and adhering to diabetic diet: Determined patient is now checking his CBG's at home 1-2 times daily and is logging his readings; Educated on 15'15' rule    . Reviewed medications with patient and discussed indication, dosage and frequency of prescribed medications for diabetes: Determined patient understands his regimen and is adhering, determined patient is taking Rybelsus 7 mg daily but is prescribed 14 mg daily, he has both doses on hand . Collaborated with Rexanne Mano D regarding patient's reported dosing of Rybelsus of 7 mg, per Coralyn Helling, its okay for patient to take 2- 7 mg tablets daily starting the 14 mg tablet once using up 7 mg supply, patient verbalizes understanding and Vallie will follow up with patient  . Mailed printed educational materials related to Carb Counting  . Discussed plans with patient for ongoing care management follow up and provided patient with direct contact information for care  management team  Patient Self Care Activities:  . Self administer medications as prescribed  . Attend all scheduled provider appointments . Call pharmacy for medication refills . Call provider office for new concerns or questions . Continue to monitor CBG's at home as discussed . Continue to adhere to dietary and exercise recommendations as discussed  . Review DM patient educational materials to discuss at next RN CM f/u call   Next Scheduled Follow Up Call: 01/02/21   Care Plan : Mixed Hyperlipidemia  Updates made by Lynne Logan, RN since 10/03/2020 12:00 AM    Problem: Mixed Hyperlipidemia   Priority: High    Long-Range Goal: Mixed Hyperlipidemia - Disease Progression minimized or avoided   Start Date: 07/01/2020  Expected End Date: 07/01/2021  Recent Progress: On track  Priority: High  Note:   CARE  PLAN ENTRY (see longitudinal plan of care for additional care plan information)  Current Barriers:  [name] Knowledge Deficits related to disease process and Self Health management of Mixed Hyperlipidemia  . Chronic Disease Management support and education needs related to DMII, Essential Hypertension, Stage 3a CKD, Mixed Hyperlipidemia  Nurse Case Manager Clinical Goal(s):  [name] Patient will work with the CCM team and PCP to address needs related to disease education and support to improve Self Health management of Mixed Hyperlipidemia as evidence by patient will lower and maintain Cholesterol levels within normal limits   CCM RN CM Interventions:  10/02/20 completed successful outbound call with patient  . Inter-disciplinary care team collaboration (see longitudinal plan of care) . Evaluation of current treatment plan related to Mixed Hyperlipidemia and patient's adherence to plan as established by provider. . Provided education to patient re: current lipid panel; educated on dietary and exercise recommendations; educated on disease process and potential complications if left  untreated . Reviewed medications with patient and discussed patient reports adherence to taking Atorvastatin 40 mg q hs  . Reviewed and discussed Cardiology follow up with Dr. Debara Pickett concerning Hyperlipidemia and confirmed patient verbalizes understanding of his prescribed treatment plan:  ASSESSMENT: 1. Hypertriglyceridemia 2. Uncontrolled DM2 3. Chronic diastolic CHF 4. Obesity PLAN: 1.   Mr. Tweed has had some improvement in his lipids although triglycerides remain elevated.  I advised him to increase his fish oil from 1 to 2 g a day for additional benefit.  I would not recommend fibrate because of moderate chronic kidney disease.  He does seem like he takes a lot of NSAIDs, namely naproxen daily in addition to his aspirin which might be affecting his kidneys negatively.  Plan repeat lipids in about 6 months and follow-up at that time . Mailed printed educational material related to Hyperlipidemia management  . Discussed plans with patient for ongoing care management follow up and provided patient with direct contact information for care management team Patient Self Care Activities:  . Self administer medications as prescribed . Attend all scheduled provider appointments . Call pharmacy for medication refills . Adhere to heart healthy diet as discussed   Next Scheduled Follow Up Date: 01/02/21     Plan:Telephone follow up appointment with care management team member scheduled for:  01/02/21  Barb Merino, RN, BSN, CCM Care Management Coordinator [facility]  Direct Phone: [phone]

## 2020-10-06 ENCOUNTER — Telehealth: Payer: Self-pay

## 2020-10-06 NOTE — Chronic Care Management (AMB) (Signed)
    Patient aware of telephone appointment with [name] [facility] on 10-07-2020 at 10:00. Patient aware to have all medications, supplements, blood pressure and/or blood sugar logs to visit.  Questions: Have you had any recent office visit or specialist visit outside of [place]? No  Are there any concerns you would like to discuss during your office visit? Nothing he can think of at the moment   Are you having any problems obtaining your medications? (Whether it pharmacy issues or cost) Patient states no problem   Star Rating Drug: Atorvastatin 40 MG- Last filled 09-23-2020 90 [facility] Losartan 100 MG- Last filled 06-11-2020 [facility] Metformin 1000 MG- Last filled 09-24-2020 [facility] Rybelsus 14 MG- Patient Assistance   Any gaps in medications fill history? No   [place] Pharmacist Assistant [phone]

## 2020-10-07 ENCOUNTER — Telehealth: Payer: Self-pay

## 2020-10-07 NOTE — Progress Notes (Deleted)
Chronic Care Management Pharmacy Note  10/07/2020 Name:  Robert Walters MRN:  [id number] DOB:  Sep 09, 1955  Summary: ***  Recommendations/Changes made from today's visit: ***  Plan: ***   Subjective: Robert Walters is an 65 y.o. year old male who is a primary patient of [name], [facility].  The CCM team was consulted for assistance with disease management and care coordination needs.    Engaged with patient by telephone for follow up visit in response to provider referral for pharmacy case management and/or care coordination services.   Consent to Services:  The patient was given information about Chronic Care Management services, agreed to services, and gave verbal consent prior to initiation of services.  Please see initial visit note for detailed documentation.   Patient Care Team: [name], FNP as PCP - General (General Practice) Nahser, Wonda Cheng, MD as PCP - Cardiology (Cardiology) Rex Kras, Claudette Stapler, RN as Case Manager   Recent office visits: ***  Recent consult visits: [facility] visits: {Hospital DC Yes/No:25215}   Objective:  Lab Results  Component Value Date   CREATININE 1.56 (H) 09/30/2020   BUN 27 09/30/2020   GFRNONAA 50 (L) 03/26/2020   GFRAA 58 (L) 03/26/2020   NA 144 09/30/2020   K 5.4 (H) 09/30/2020   CALCIUM 9.9 09/30/2020   CO2 20 09/30/2020   GLUCOSE 149 (H) 09/30/2020    Lab Results  Component Value Date/Time   HGBA1C 7.1 (H) 09/30/2020 10:55 AM   HGBA1C 6.9 (H) 06/30/2020 03:50 PM   MICROALBUR 10 03/26/2020 01:16 PM   MICROALBUR 30 11/06/2019 04:23 PM    Last diabetic Eye exam:  Lab Results  Component Value Date/Time   HMDIABEYEEXA No Retinopathy 01/02/2020 12:00 AM    Last diabetic Foot exam: No results found for: HMDIABFOOTEX   Lab Results  Component Value Date   CHOL 150 09/30/2020   HDL 35 (L) 09/30/2020   LDLCALC 69 09/30/2020   LDLDIRECT 124.3 11/10/2010   TRIG 287 (H) 09/30/2020   CHOLHDL 4.3 09/30/2020     Hepatic Function Latest Ref Rng & Units 09/30/2020 06/30/2020 03/26/2020  Total Protein 6.0 - 8.5 g/dL 7.4 7.5 6.8  Albumin 3.8 - 4.8 g/dL 4.9(H) 5.0(H) 4.4  AST 0 - 40 IU/L 42(H) 51(H) 50(H)  ALT 0 - 44 IU/L 57(H) 78(H) 66(H)  Alk Phosphatase 44 - 121 IU/L 159(H) 186(H) 191(H)  Total Bilirubin 0.0 - 1.2 mg/dL 0.5 0.4 0.6  Bilirubin, Direct 0.0 - 0.2 mg/dL - - -    Lab Results  Component Value Date/Time   TSH 1.650 06/30/2020 03:50 PM   TSH 2.700 11/29/2018 10:46 AM   FREET4 1.69 03/24/2009 01:00 PM    CBC Latest Ref Rng & Units 03/26/2020 11/29/2018 09/01/2016  WBC 3.4 - 10.8 x10E3/uL 6.9 7.0 11.4(H)  Hemoglobin 13.0 - 17.7 g/dL 13.5 12.8(L) 12.1(L)  Hematocrit 37.5 - 51.0 % 40.2 37.6 36.0(L)  Platelets 150 - 450 x10E3/uL 178 161 315    No results found for: VD25OH  Clinical ASCVD: {YES/NO:21197} The ASCVD Risk score Mikey Bussing DC Jr., et al., 2013) failed to calculate for the following reasons:   The patient has a prior MI or stroke diagnosis    Depression screen [facility] 2/9 09/30/2020 06/30/2020 03/26/2020  Decreased Interest 0 0 0  Down, Depressed, Hopeless 0 0 0  PHQ - 2 Score 0 0 0  Altered sleeping 0 0 0  Tired, decreased energy 0 1 0  Change in appetite 0 0  0  Feeling bad or failure about yourself  0 0 0  Trouble concentrating 0 0 0  Moving slowly or fidgety/restless 0 0 0  Suicidal thoughts 0 0 0  PHQ-9 Score 0 1 0  Difficult doing work/chores - Not difficult at all -  Some recent data might be hidden     ***Other: (CHADS2VASc if Afib, MMRC or CAT for COPD, ACT, DEXA)  Social History   Tobacco Use  Smoking Status Never Smoker  Smokeless Tobacco Never Used   BP Readings from Last 3 Encounters:  09/30/20 132/76  09/15/20 130/76  06/30/20 116/72   Pulse Readings from Last 3 Encounters:  09/30/20 99  09/15/20 (!) 104  06/30/20 94   Wt Readings from Last 3 Encounters:  09/30/20 235 lb (106.6 kg)  09/15/20 237 lb 6.4 oz (107.7 kg)  06/30/20 232 lb (105.2  kg)   BMI Readings from Last 3 Encounters:  09/30/20 34.70 kg/m  09/15/20 35.06 kg/m  06/30/20 34.26 kg/m    Assessment/Interventions: Review of patient past medical history, allergies, medications, health status, including review of consultants reports, laboratory and other test data, was performed as part of comprehensive evaluation and provision of chronic care management services.   SDOH:  (Social Determinants of Health) assessments and interventions performed: {yes/no:20286}  SDOH Screenings   Alcohol Screen: Not on file  Depression (PHQ2-9): Low Risk   . PHQ-2 Score: 0  Financial Resource Strain: Low Risk   . Difficulty of Paying Living Expenses: Not hard at all  Food Insecurity: No Food Insecurity  . Worried About Charity fundraiser in the Last Year: Never true  . Ran Out of Food in the Last Year: Never true  Housing: Not on file  Physical Activity: Inactive  . Days of Exercise per Week: 0 days  . Minutes of Exercise per Session: 0 min  Social Connections: Not on file  Stress: No Stress Concern Present  . Feeling of Stress : Not at all  Tobacco Use: Low Risk   . Smoking Tobacco Use: Never Smoker  . Smokeless Tobacco Use: Never Used  Transportation Needs: No Transportation Needs  . Lack of Transportation (Medical): No  . Lack of Transportation (Non-Medical): No    CCM Care Plan  No Known Allergies  Medications Reviewed Today    Reviewed by Lynne Logan, RN (Registered Nurse) on 10/02/20 at [place] List Status: <None>  Medication Order Taking? Sig Documenting Provider Last Dose Status Informant  alfuzosin (UROXATRAL) 10 MG 24 hr tablet [id number] No Take 1 tablet (10 mg total) by mouth daily. Franchot Gallo, MD Taking Active   Ascorbic Acid (VITAMIN C) 500 MG CAPS [id number] No Take 500 mg by mouth daily.  [provider] Taking Active   aspirin EC 81 MG tablet [id number] No Take 1 tablet (81 mg total) by mouth daily. Nahser, Wonda Cheng, MD Taking  Active   atorvastatin (LIPITOR) 40 MG tablet [id number]  Take 1 tablet (40 mg total) by mouth at bedtime. [name], FNP  Active   b complex vitamins tablet [id number] No Take 1 tablet by mouth daily.  [provider] Taking Active   cephALEXin (KEFLEX) 500 MG capsule [id number]  Take 1 capsule (500 mg total) by mouth 4 (four) times daily for 10 days. [name], FNP  Active   cholecalciferol (VITAMIN D3) 25 MCG (1000 UNIT) tablet [id number] No Take 2 each by mouth daily.  [provider] Taking Active Self  CINNAMON PO [id number]  No Take 1 tablet by mouth daily. 1068m daily [provider] Taking Active Self  Coenzyme Q10 (COQ10) 200 MG CAPS [id number] Take 1 tablet by mouth daily.  [provider] Taking Active Self  empagliflozin (JARDIANCE) 25 MG TABS tablet [id number] Take 1 tablet (25 mg total) by mouth daily before breakfast.  Patient taking differently: Take 25 mg by mouth every other day.   MMinette Brine FNP Taking Active   FLUoxetine (PROZAC) 40 MG capsule [id number] Take 1 capsule (40 mg total) by mouth every morning. HAlen BlewTaking Active   Garlic 17902MG CAPS [id number] Take 1 tablet by mouth daily.  [provider] Taking Active   glucose blood test strip [id number] Use to check blood sugars daily 3 times daily  E11.65 MMinette Brine FNP Taking Active   insulin degludec (TRESIBA FLEXTOUCH) 100 UNIT/ML FlexTouch Pen [id number] Inject 50 Units into the skin daily. MMinette Brine FNP Taking Active   Insulin Pen Needle (PEN NEEDLES) 32G X 4 MM MISC [id number] Use as directed MMinette Brine FNP Taking Active   lamoTRIgine (LAMICTAL) 150 MG tablet [id number] Take 1 tablet (150 mg total) by mouth 2 (two) times daily. HAddison Lank PA-C Taking Active   LORazepam (ATIVAN) 1 MG tablet [id number] TAKE 1 TABLET BY MOUTH THREE TIMES DAILY AS NEEDED FOR ANXIETY. GENERIC EQUIVALENT FOR ATIVAN. HAddison Lank PA-C  Taking Active   losartan (COZAAR) 100 MG tablet [id number] Take 1 tablet (100 mg total) by mouth daily. Nahser, PWonda Cheng MD Taking Active   metFORMIN (GLUCOPHAGE) 1000 MG tablet [id number] TAKE 1 TABLET BY MOUTH 2 TIMES DAILY WITH MEALS MMinette Brine FNP Taking Active   metoprolol tartrate (LOPRESSOR) 25 MG tablet [id number] TAKE 1 TABLET BY MOUTH 2 TIMES DAILY. GENERIC EQUIVALENT FOR LOPRESSOR. Nahser, PWonda Cheng MD Taking Active   Multiple Vitamin (MULTIVITAMIN WITH MINERALS) TABS tablet [id number] Take 1 tablet by mouth daily. [provider] Taking Active Self  Omega-3 Fatty Acids (FISH OIL) 1000 MG CAPS [id number] Take 2 capsules by mouth daily. [provider] Taking Active   omeprazole (PRILOSEC) 40 MG capsule [id number] TAKE 1 CAPSULE BY MOUTH 20 MINUTES BEFORE BREAKFAST. CMontez Morita DQuillian Quince MD Taking Active   Semaglutide ([facility] 14 MG TABS [id number] Take 14 mg by mouth daily at 6 (six) AM. MMinette Brine FNP Taking Active   vitamin B-12 (CYANOCOBALAMIN) 500 MCG tablet [id number] Take 500 mcg by mouth daily. [provider] Taking Active           Patient Active Problem List   Diagnosis Date Noted  . Chronic diastolic heart failure ([place] 03/27/2019  . Fatty liver 12/26/2018  . Elevated LFTs 12/26/2018  . History of colonic polyps 12/26/2018  . Fall 08/28/2018  . Rib pain on left side 08/28/2018  . Anxiety 03/31/2018  . Bipolar II disorder ([place] 03/31/2018  . Insomnia 03/31/2018  . Type 2 diabetes mellitus without complication, without long-term current use of insulin ([place] 03/24/2018  . Bile leak 09/15/2016  . Sepsis ([facility] 08/23/2016  . Cholecystitis   . Stroke ([place] 07/25/2015  . 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 Dizziness 08/09/2014  . TIA (transient ischemic attack) 05/15/2012  . Blurred vision 05/15/2012  . Ataxia 05/15/2012  . Diabetes mellitus type 2 with complications (H[place] Elder 038/46/6599 . BPH (benign  prostatic hyperplasia) 05/15/2012  . GERD (gastroesophageal reflux disease) 05/15/2012  .  Obesity 05/15/2012  . Bipolar 1 disorder ([place]) 05/15/2012  . Essential hypertension 10/30/2010  . Cardiovascular risk factor 10/30/2010  . Pure hypercholesterolemia 12/05/2008    Immunization History  Administered Date(s) Administered  . DTaP 10/05/2011  . Influenza, High Dose Seasonal PF 01/15/2019  . Influenza-Unspecified 03/03/2012, 03/16/2018, 03/21/2020  . Moderna Sars-Covid-2 Vaccination 12/07/2019, 01/04/2020  . Pneumococcal Conjugate-13 03/21/2019  . Pneumococcal Polysaccharide-23 05/16/2012  . Tdap 10/01/2020  . Zoster Recombinat (Shingrix) 11/19/2019    Conditions to be addressed/monitored:  {USCCMDZASSESSMENTOPTIONS:23563}  There are no care plans that you recently modified to display for this patient.    Medication Assistance: {MEDASSISTANCEINFO:25044}  Compliance/Adherence/Medication fill history: Care Gaps: ***  Star-Rating Drugs: ***  Patient's preferred pharmacy is:  [facility] [place], [place] [address] Phone: [phone] Fax: [phone]  [place], [address] [place] [address] Phone: [phone] Fax: [phone]  [place], [place] - [address] [place] [address] [place] [address] Phone: [phone] Fax: [phone]  [id number] [address]: [phone]: [phone] Uses pill box? {Yes or If no, why not?:20788} Pt endorses ***% compliance  We discussed: {Pharmacy options:24294} Patient decided to: {US Pharmacy Plan:23885}  Care Plan and Follow Up Patient Decision:  {FOLLOWUP:24991}  Plan: {CM FOLLOW UP PJUVQ:22411} ***   Current Barriers:   . {pharmacybarriers:24917}  Pharmacist Clinical Goal(s):  .Marland KitchenPatient will {PHARMACYGOALCHOICES:24921} through collaboration with PharmD and provider.   Interventions: . 1:1 collaboration with MMinette Brine FNP regarding development and update of comprehensive plan of care as evidenced by provider attestation and co-signature . Inter-disciplinary care team collaboration (see longitudinal plan of care) . Comprehensive medication review performed; medication list updated in electronic medical record  {CCM PHARMD DISEASE STATES:25130}  Patient Goals/Self-Care Activities . Patient will:  - {pharmacypatientgoals:24919}  Follow Up Plan: {CM FOLLOW UP POYWV:14276}

## 2020-10-09 ENCOUNTER — Telehealth: Payer: Self-pay

## 2020-10-09 NOTE — Telephone Encounter (Signed)
Patient called requesting his lab results. I returned pt call and advised him that Ms.Laurance Flatten has been out of the office so she has not been able to review it but when she comes in on Monday I will have her to look at it. Pt understood and was ok with it. YL,RMA

## 2020-10-10 ENCOUNTER — Telehealth: Payer: Self-pay

## 2020-10-10 NOTE — Chronic Care Management (AMB) (Signed)
Called patient to inform him that his [name] is ready to be picked up. He can come by the office next week. Patient stated understanding.  [place] Pharmacist Assistant [phone]

## 2020-10-16 ENCOUNTER — Telehealth: Payer: Self-pay

## 2020-10-16 NOTE — Chronic Care Management (AMB) (Signed)
Patient wanted to let us know that he is having car trouble and will be by the office to pick tresiba up next Tuesday.  [name] Pharmacist Assistant [phone]

## 2020-11-06 ENCOUNTER — Other Ambulatory Visit: Payer: Self-pay | Admitting: Cardiovascular Disease

## 2020-11-10 DIAGNOSIS — I739 Peripheral vascular disease, unspecified: Secondary | ICD-10-CM | POA: Diagnosis not present

## 2020-11-19 ENCOUNTER — Ambulatory Visit (INDEPENDENT_AMBULATORY_CARE_PROVIDER_SITE_OTHER): Payer: Medicare Other | Admitting: Gastroenterology

## 2020-11-20 ENCOUNTER — Other Ambulatory Visit: Payer: Self-pay

## 2020-11-20 ENCOUNTER — Other Ambulatory Visit (INDEPENDENT_AMBULATORY_CARE_PROVIDER_SITE_OTHER): Payer: Self-pay

## 2020-11-20 ENCOUNTER — Ambulatory Visit (INDEPENDENT_AMBULATORY_CARE_PROVIDER_SITE_OTHER): Payer: Medicare Other | Admitting: Gastroenterology

## 2020-11-20 ENCOUNTER — Encounter (INDEPENDENT_AMBULATORY_CARE_PROVIDER_SITE_OTHER): Payer: Self-pay

## 2020-11-20 ENCOUNTER — Encounter (INDEPENDENT_AMBULATORY_CARE_PROVIDER_SITE_OTHER): Payer: Self-pay | Admitting: Gastroenterology

## 2020-11-20 ENCOUNTER — Telehealth (INDEPENDENT_AMBULATORY_CARE_PROVIDER_SITE_OTHER): Payer: Self-pay

## 2020-11-20 VITALS — BP 132/87 | HR 118 | Temp 98.1°F | Ht 69.0 in | Wt 231.6 lb

## 2020-11-20 DIAGNOSIS — K219 Gastro-esophageal reflux disease without esophagitis: Secondary | ICD-10-CM

## 2020-11-20 DIAGNOSIS — K7581 Nonalcoholic steatohepatitis (NASH): Secondary | ICD-10-CM | POA: Diagnosis not present

## 2020-11-20 DIAGNOSIS — R1319 Other dysphagia: Secondary | ICD-10-CM

## 2020-11-20 DIAGNOSIS — Z86010 Personal history of colonic polyps: Secondary | ICD-10-CM | POA: Diagnosis not present

## 2020-11-20 DIAGNOSIS — R1310 Dysphagia, unspecified: Secondary | ICD-10-CM | POA: Insufficient documentation

## 2020-11-20 MED ORDER — OMEPRAZOLE 40 MG PO CPDR: 40.0000 mg | DELAYED_RELEASE_CAPSULE | Freq: Every day | ORAL | 3 refills | Status: DC

## 2020-11-20 MED ORDER — PEG 3350-KCL-NA BICARB-NACL 420 G PO SOLR: 4000.0000 mL | ORAL | 0 refills | Status: DC

## 2020-11-20 NOTE — Telephone Encounter (Signed)
LeighAnn Cassandr Cederberg, CMA  

## 2020-11-20 NOTE — Progress Notes (Signed)
Maylon Peppers, M.D. Gastroenterology & Hepatology [facility] [address] [place], [place] [address]  Primary Care Physician: [name], [place] [address] [place] [address]  I will communicate my assessment and recommendations to the referring MD via EMR.  Problems: GERD NASH FHx colon cancer - father  History of Present Illness: Robert Walters is a 65 y.o. male with past medical history of GERD,  NASH, bipolar disorder, BPH, CVA, hyperlipidemia, hypertension, diabetes and TIA, who presents for follow up of GERD and NASH.  The patient was last seen on 11/19/2019. At that time, the patient was presenting seldom dysphagia episodes and decided to postpone any endoscopic procedures unless his symptoms get worse.  He was continued on omeprazole.  Patient reports that he is currently taking omeprazole 40 mg every day, which has led to control of his GERD.  Does not have any episodes of heartburn while taking the medication.  Only states he has had some problems with dysphagia when eating a burger intermittently, which he feels it is difficult for him to swallow.  He has to wait until it goes down sometimes and that has concerned him.  Does not happen with every single meal. Denies any odynophagia.He reports that he would like to have this investigated further with an endoscopy.  The patient denies having any nausea, vomiting, fever, chills, hematochezia, melena, hematemesis, abdominal distention, abdominal pain, diarrhea, jaundice, pruritus. Has intentionally lost 15 lb by changing diet and exercise.  Notably, the patient has a history of elevated aminotransferases chronically.His most recent CMP showed an AST of 42, ALT of 57, albumin 4.9, total bilirubin 0.5 and alkaline phosphatase 159.  He has had an extensive serologic evaluation and abdominal ultrasound which point towards a diagnosis of NASH.  Father had a history  of colon cancer in  his early 46s.  Last Endoscopy: 2019-moderate diffuse gastritis. Last Colonoscopy: 01/2018-family history of colon cancer in father with personal history of colon polyps, multiple polyps, 6 mm polyp rectum, 3 polyps mid sigmoid, 10 mm polyp mid ascending, sessile polyp cecum, polyp ascending x2.  Path with tubular adenomas, 3-year repeat recommended.  Past Medical History: Past Medical History:  Diagnosis Date   Acid reflux disease    Bipolar 1 disorder, mixed (HCC)    BPH (benign prostatic hypertrophy)    CVA (cerebral vascular accident) ([place]) 07/2015   Gastritis    H/O hiatal hernia    Hyperlipidemia    Hypertension    Normal nuclear stress test Dec 2011   Obesity    TIA (transient ischemic attack) 05/15/2012   "they think I've had one this am @ 0130" (05/15/2012)   Type II diabetes mellitus ([place])    Vertigo     Past Surgical History: Past Surgical History:  Procedure Laterality Date   BILIARY STENT PLACEMENT N/A 09/17/2016   Procedure: BILIARY STENT PLACEMENT;  Surgeon: [name], MD;  Location[facility];  Service: Endoscopy;  Laterality: N/A;   CARDIOVASCULAR STRESS TEST  2009   EF 63%   CHOLECYSTECTOMY N/A 08/25/2016   Procedure: LAPAROSCOPIC CHOLECYSTECTOMY;  Surgeon: [name], MD;  Location: [facility] ORS;  Service: General;  Laterality: N/A;   COLONOSCOPY     ERCP N/A 09/17/2016   Procedure: ENDOSCOPIC RETROGRADE CHOLANGIOPANCREATOGRAPHY (ERCP);  Surgeon: [name], MD;  Location: [facility];  Service: Endoscopy;  Laterality: N/A;  730   ERCP N/A 12/03/2016   Procedure: ENDOSCOPIC RETROGRADE CHOLANGIOPANCREATOGRAPHY (ERCP);  Surgeon: [name], MD;  Location: [facility];  Service: Gastroenterology;  Laterality: N/A;   ESOPHAGOGASTRODUODENOSCOPY     GASTROINTESTINAL STENT REMOVAL N/A 12/03/2016   Procedure: BILIARY STENT REMOVAL;  Surgeon: [name], MD;  Location: [facility];  Service: Gastroenterology;  Laterality: N/A;    NO PAST SURGERIES     SPHINCTEROTOMY N/A 09/17/2016   Procedure: SPHINCTEROTOMY;  Surgeon: [name], MD;  Location: [facility];  Service: Endoscopy;  Laterality: N/A;    Family History: Family History  Problem Relation Age of Onset   Hyperlipidemia Father        in a nursing home   Colon cancer Father    Stroke Mother 33    Social History: Social History   Tobacco Use  Smoking Status Never  Smokeless Tobacco Never   Social History   Substance and Sexual Activity  Alcohol Use No   Social History   Substance and Sexual Activity  Drug Use No    Allergies: No Known Allergies  Medications: Current Outpatient Medications  Medication Sig Dispense Refill   alfuzosin (UROXATRAL) 10 MG 24 hr tablet Take 1 tablet (10 mg total) by mouth daily. 30 tablet 2   Ascorbic Acid (VITAMIN C) 500 MG CAPS Take 500 mg by mouth daily.      aspirin EC 81 MG tablet Take 1 tablet (81 mg total) by mouth daily.     atorvastatin (LIPITOR) 40 MG tablet Take 1 tablet (40 mg total) by mouth at bedtime. 90 tablet 1   b complex vitamins tablet Take 1 tablet by mouth daily.      cholecalciferol (VITAMIN D3) 25 MCG (1000 UNIT) tablet Take 2 each by mouth daily.      CINNAMON PO Take 1 tablet by mouth daily. 1000mg  daily     Coenzyme Q10 (COQ10) 200 MG CAPS Take 1 tablet by mouth daily.      empagliflozin (JARDIANCE) 25 MG TABS tablet Take 1 tablet (25 mg total) by mouth daily before breakfast. (Patient taking differently: Take 25 mg by mouth every other day.) 30 tablet 2   FLUoxetine (PROZAC) 40 MG capsule Take 1 capsule (40 mg total) by mouth every morning. 90 capsule 1   Garlic 4332 MG CAPS Take 1 tablet by mouth daily.      glucose blood test strip Use to check blood sugars daily 3 times daily  E11.65 200 each 3   insulin degludec (TRESIBA FLEXTOUCH) 100 UNIT/ML FlexTouch Pen Inject 50 Units into the skin daily. 15 mL 0   Insulin Pen Needle (PEN NEEDLES) 32G X 4 MM MISC Use as directed  35 each 0   lamoTRIgine (LAMICTAL) 150 MG tablet Take 1 tablet (150 mg total) by mouth 2 (two) times daily. 180 tablet 1   LORazepam (ATIVAN) 1 MG tablet TAKE 1 TABLET BY MOUTH THREE TIMES DAILY AS NEEDED FOR ANXIETY. GENERIC EQUIVALENT FOR ATIVAN. 270 tablet 1   metFORMIN (GLUCOPHAGE) 1000 MG tablet TAKE 1 TABLET BY MOUTH 2 TIMES DAILY WITH MEALS 180 tablet 1   metoprolol tartrate (LOPRESSOR) 25 MG tablet TAKE 1 TABLET BY MOUTH 2 TIMES DAILY. GENERIC EQUIVALENT FOR LOPRESSOR. 180 tablet 3   Multiple Vitamin (MULTIVITAMIN WITH MINERALS) TABS tablet Take 1 tablet by mouth daily.     Omega-3 Fatty Acids (FISH OIL) 1000 MG CAPS Take 2 capsules by mouth daily.     omeprazole (PRILOSEC) 40 MG capsule TAKE 1 CAPSULE BY MOUTH 20 MINUTES BEFORE BREAKFAST. [place]  capsule 0   Semaglutide (RYBELSUS) 14 MG TABS Take 14 mg by mouth daily at 6 (six) AM. 30 tablet 0   valsartan (DIOVAN) 320 MG tablet Take 1 tablet (320 mg total) by mouth daily. 90 tablet 3   vitamin B-12 (CYANOCOBALAMIN) 500 MCG tablet Take 500 mcg by mouth daily.     No current facility-administered medications for this visit.    Review of Systems: GENERAL: negative for malaise, night sweats HEENT: No changes in hearing or vision, no nose bleeds or other nasal problems. NECK: Negative for lumps, goiter, pain and significant neck swelling RESPIRATORY: Negative for cough, wheezing CARDIOVASCULAR: Negative for chest pain, leg swelling, palpitations, orthopnea GI: SEE HPI MUSCULOSKELETAL: Negative for joint pain or swelling, back pain, and muscle pain. SKIN: Negative for lesions, rash PSYCH: Negative for sleep disturbance, mood disorder and recent psychosocial stressors. HEMATOLOGY Negative for prolonged bleeding, bruising easily, and swollen nodes. ENDOCRINE: Negative for cold or heat intolerance, polyuria, polydipsia and goiter. NEURO: negative for tremor, gait imbalance, syncope and seizures. The remainder of the review of systems is  noncontributory.   Physical Exam: BP 132/87 (BP Location: Right Arm, Patient Position: Sitting, Cuff Size: Large)   Pulse (!) 118   Temp 98.1 F (36.7 C) (Oral)   Ht 5\' 9"  (1.753 m)   Wt 231 lb 9.6 oz (105.1 kg)   BMI 34.20 kg/m  GENERAL: The patient is AO x3, in no acute distress. Obese. HEENT: Head is normocephalic and atraumatic. EOMI are intact. Mouth is well hydrated and without lesions. NECK: Supple. No masses LUNGS: Clear to auscultation. No presence of rhonchi/wheezing/rales. Adequate chest expansion HEART: RRR, normal s1 and s2. ABDOMEN: Soft, nontender, no guarding, no peritoneal signs, and nondistended. BS +. No masses. EXTREMITIES: Without any cyanosis, clubbing, rash, lesions or edema. NEUROLOGIC: AOx3, no focal motor deficit. SKIN: no jaundice, no rashes  Imaging/Labs: as above  I personally reviewed and interpreted the available labs, imaging and endoscopic files.  Impression and Plan: Robert Walters is a 65 y.o. male with past medical history of GERD,  NASH, bipolar disorder, BPH, CVA, hyperlipidemia, hypertension, diabetes and TIA, who presents for follow up of GERD and NASH.  The patient has had adequate control of his GERD symptoms with the use of omeprazole at the current dosage.  I will refill his medication as it has led to adequate control of his GERD, but we will need to investigate his dysphagia episodes as it seems that they have been getting worse.  We will schedule an EGD for this.  At the same time we will proceed with repeat colonoscopy in October for surveillance due to her history of colon polyps.  Finally, the patient has presented chronically elevated LFTs due to NASH.  He has lost weight on purpose by restricting the amount of calories he was eating - I congratulated him for this.  He would benefit further from implementing a Mediterranean diet, I provided a dietary list.  Given the fact that his NAFLD score is indeterminate for advanced fibrosis, we  will proceed with a liver elastography.  - Schedule EGD and colonoscopy - Continue omeprazole 40 mg qday - Explained to the patient the etiology and consequences of his current liver disease. Patient was counseled about the benefit of implementing a Mediterranean diet and exercise plan to decrease at least 10% of weight. The patient understood about the importance of lifestyle changes to potentially reverse his liver involvement. - Schedule liver elastography  All questions were answered.  Harvel Quale, MD Gastroenterology and Hepatology [facility]

## 2020-11-20 NOTE — Patient Instructions (Addendum)
Schedule EGD and colonoscopy Continue omeprazole 40 mg qday Explained to the patient the etiology and consequences of his current liver disease. Patient was counseled about the benefit of implementing a Mediterranean diet and exercise plan to decrease at least 5% of weight. The patient understood about the importance of lifestyle changes to potentially reverse his liver involvement. Schedule liver elastography

## 2020-11-21 ENCOUNTER — Encounter (INDEPENDENT_AMBULATORY_CARE_PROVIDER_SITE_OTHER): Payer: Self-pay

## 2020-11-26 ENCOUNTER — Ambulatory Visit (HOSPITAL_COMMUNITY)
Admission: RE | Admit: 2020-11-26 | Discharge: 2020-11-26 | Disposition: A | Discharge status: Home / Self Care | Payer: Medicare Other | Source: Ambulatory Visit | Attending: Gastroenterology | Admitting: Gastroenterology

## 2020-11-26 ENCOUNTER — Other Ambulatory Visit: Payer: Self-pay

## 2020-11-26 DIAGNOSIS — K7581 Nonalcoholic steatohepatitis (NASH): Secondary | ICD-10-CM | POA: Diagnosis not present

## 2020-12-01 ENCOUNTER — Telehealth: Payer: Self-pay

## 2020-12-01 NOTE — Chronic Care Management (AMB) (Signed)
Chronic Care Management Pharmacy Assistant   Name: Robert Walters  MRN: [id number] DOB: Jul 21, 1955   Reason for Encounter: Disease State/ Diabetes    Recent office visits:  09-30-2020 [name], [place]. Tdap booster. START Keflex 500 mg four times daily for 10 days. Trig= 287, HDL= 35, VLDL= 46. A1C= 7.1. Glucose= 149, Creatinine= 1.56, eGFR= 49, Potassium= 5.4, Albumin= 4.9, Alkaline= 159, AST= 42, ALT= 57.  10-02-2020 Little, Claudette Stapler, RN (CCM)  Recent consult visits:  09-15-2020 Pixie Casino, MD (Cardiology). Orders placed for lipid panel  11-20-2020 Harvel Quale, MD (Gastroenterology). Orders placed for US abdomen RUQ.  Hospital visits:  None in previous 6 months  Medications: Outpatient Encounter Medications as of 12/01/2020  Medication Sig Note   alfuzosin (UROXATRAL) 10 MG 24 hr tablet Take 1 tablet (10 mg total) by mouth daily.    Ascorbic Acid (VITAMIN C) 500 MG CAPS Take 500 mg by mouth daily.     aspirin EC 81 MG tablet Take 1 tablet (81 mg total) by mouth daily.    atorvastatin (LIPITOR) 40 MG tablet Take 1 tablet (40 mg total) by mouth at bedtime.    b complex vitamins tablet Take 1 tablet by mouth daily.     cholecalciferol (VITAMIN D3) 25 MCG (1000 UNIT) tablet Take 2 each by mouth daily.     CINNAMON PO Take 1 tablet by mouth daily. 1011m daily    Coenzyme Q10 (COQ10) 200 MG CAPS Take 1 tablet by mouth daily.     empagliflozin (JARDIANCE) 25 MG TABS tablet Take 1 tablet (25 mg total) by mouth daily before breakfast. (Patient taking differently: Take 25 mg by mouth every other day.) 11/20/2020: Takes daily   FLUoxetine (PROZAC) 40 MG capsule Take 1 capsule (40 mg total) by mouth every morning.    Garlic 16767MG CAPS Take 1 tablet by mouth daily.     glucose blood test strip Use to check blood sugars daily 3 times daily  E11.65    insulin degludec (TRESIBA FLEXTOUCH) 100 UNIT/ML FlexTouch Pen Inject 50 Units into the skin daily.    Insulin Pen  Needle (PEN NEEDLES) 32G X 4 MM MISC Use as directed    lamoTRIgine (LAMICTAL) 150 MG tablet Take 1 tablet (150 mg total) by mouth 2 (two) times daily.    LORazepam (ATIVAN) 1 MG tablet TAKE 1 TABLET BY MOUTH THREE TIMES DAILY AS NEEDED FOR ANXIETY. GENERIC EQUIVALENT FOR ATIVAN.    metFORMIN (GLUCOPHAGE) 1000 MG tablet TAKE 1 TABLET BY MOUTH 2 TIMES DAILY WITH MEALS    metoprolol tartrate (LOPRESSOR) 25 MG tablet TAKE 1 TABLET BY MOUTH 2 TIMES DAILY. GENERIC EQUIVALENT FOR LOPRESSOR.    Multiple Vitamin (MULTIVITAMIN WITH MINERALS) TABS tablet Take 1 tablet by mouth daily.    Omega-3 Fatty Acids (FISH OIL) 1000 MG CAPS Take 2 capsules by mouth daily.    omeprazole (PRILOSEC) 40 MG capsule Take 1 capsule (40 mg total) by mouth daily.    polyethylene glycol-electrolytes (TRILYTE) 420 g solution Take 4,000 mLs by mouth as directed.    Semaglutide (RYBELSUS) 14 MG TABS Take 14 mg by mouth daily at 6 (six) AM.    valsartan (DIOVAN) 320 MG tablet Take 1 tablet (320 mg total) by mouth daily.    vitamin B-12 (CYANOCOBALAMIN) 500 MCG tablet Take 500 mcg by mouth daily.    No facility-administered encounter medications on file as of 12/01/2020.   Recent Relevant Labs: Lab Results  Component Value  Date/Time   HGBA1C 7.1 (H) 09/30/2020 10:55 AM   HGBA1C 6.9 (H) 06/30/2020 03:50 PM   MICROALBUR 10 03/26/2020 01:16 PM   MICROALBUR 30 11/06/2019 04:23 PM    Kidney Function Lab Results  Component Value Date/Time   CREATININE 1.56 (H) 09/30/2020 10:55 AM   CREATININE 1.50 (H) 06/30/2020 03:50 PM   CREATININE 1.33 (H) 10/19/2016 02:51 PM   GFRNONAA 50 (L) 03/26/2020 11:09 AM   GFRAA 58 (L) 03/26/2020 11:09 AM    Current antihyperglycemic regimen:  Jardiance 40m daily before breakfast Tresiba 50 units daily (Patient reported taking 55 units) Metformin 1000 mg twice daily Rybelsus 14 mg daily  What recent interventions/DTPs have been made to improve glycemic control:  Educated on Prevention and  management of hypoglycemic episodes Benefits of routine self-monitoring of blood sugar Carbohydrate counting and/or plate method  Have there been any recent hospitalizations or ED visits since last visit with CPP? No  Patient denies hypoglycemic symptoms  Patient denies hyperglycemic symptoms  How often are you checking your blood sugar? once daily  What are your blood sugars ranging?  Fasting: 159, 179 Before meals: None After meals: None Bedtime: None  During the week, how often does your blood glucose drop below 70? Never  Are you checking your feet daily/regularly? Patient stated daily  Adherence Review: Is the patient currently on a STATIN medication? Yes Is the patient currently on ACE/ARB medication? Yes Does the patient have >5 day gap between last estimated fill dates? No  NOTES: Patient stated he increased the tresiba from 50 units to 55. Patient wants to know if he should increase it to 60. Sent message to Y[place] MineralCMA and JMinette BrineFNP. Patient is scheduled with VOrlando Pennerin September.  Care Gaps: Shingrix overdue PNA Vac overdue  RAF= 2.051% Medicare wellness 04-01-2021  Star Rating Drugs: Atorvastatin 40 mg- Last filled 09-23-2020 [facility] Rybelsus 14 mg- Patient assistance Valsartan 320 mg- Last filled 11-11-2020 [facility] Metformin 1000 mg- Last filled 09-24-2020 90 DS AWeslacoClinical Pharmacist Assistant [phone]

## 2020-12-08 ENCOUNTER — Encounter: Payer: Self-pay | Admitting: Physician Assistant

## 2020-12-08 ENCOUNTER — Telehealth (INDEPENDENT_AMBULATORY_CARE_PROVIDER_SITE_OTHER): Payer: Medicare Other | Admitting: Physician Assistant

## 2020-12-08 DIAGNOSIS — F411 Generalized anxiety disorder: Secondary | ICD-10-CM | POA: Diagnosis not present

## 2020-12-08 DIAGNOSIS — F3341 Major depressive disorder, recurrent, in partial remission: Secondary | ICD-10-CM | POA: Diagnosis not present

## 2020-12-08 MED ORDER — LORAZEPAM 1 MG PO TABS: ORAL_TABLET | ORAL | 1 refills | Status: DC

## 2020-12-08 MED ORDER — FLUOXETINE HCL 40 MG PO CAPS: 40.0000 mg | ORAL_CAPSULE | Freq: Every morning | ORAL | 1 refills | Status: DC

## 2020-12-08 NOTE — Progress Notes (Signed)
[facility] Med Check  Patient ID: Robert Walters,  MRN: [id number]  PCP: [name], [place]  Date of Evaluation: 12/08/2020 Time spent:30 minutes  Chief Complaint:  Chief Complaint   Anxiety; Depression; Follow-up    Virtual Visit via Telehealth  I connected with patient by telephone, with their informed consent, and verified patient privacy and that I am speaking with the correct person using two identifiers.  I am private, in my office and the patient is at home.  He has no way of doing a video visit.  I discussed the limitations, risks, security and privacy concerns of performing an evaluation and management service by telephone and the availability of in person appointments. I also discussed with the patient that there may be a patient responsible charge related to this service. The patient expressed understanding and agreed to proceed.   I discussed the assessment and treatment plan with the patient. The patient was provided an opportunity to ask questions and all were answered. The patient agreed with the plan and demonstrated an understanding of the instructions.   The patient was advised to call back or seek an in-person evaluation if the symptoms worsen or if the condition fails to improve as anticipated.  I provided 30 minutes of non-face-to-face time during this encounter.  HISTORY/CURRENT STATUS: HPI For routine med check.  Feels like meds are working well. He's able to enjoy things, but does not really do much because both his parents are gone and he does not have anywhere to go or anything to do.  Denies decreased energy or motivation.  Appetite has not changed.  No extreme sadness, tearfulness, or feelings of hopelessness.  Denies any changes in concentration, making decisions or remembering things.  He sleeps well most of the time.  Anxiety is controlled. Klonopin works well.  Denies suicidal or homicidal thoughts.  Patient denies increased energy with decreased need  for sleep, no increased talkativeness, no racing thoughts, no impulsivity or risky behaviors, no increased spending, no increased libido, no grandiosity, no increased irritability or anger, and no hallucinations.  Denies dizziness, syncope, seizures, numbness, tingling, tremor, tics, unsteady gait, slurred speech, confusion. Denies muscle or joint pain, stiffness, or dystonia.  Individual Medical History/ Review of Systems: Changes? :Yes Diabetes and cholesterol are controlled as far as he knows.  Past medications for mental health diagnoses include: Ambien, Ativan, Epitol, Lamictal, Prozac, Xanax, Wellbutrin  Allergies: Patient has no known allergies.  Current Medications:  Current Outpatient Medications:    alfuzosin (UROXATRAL) 10 MG 24 hr tablet, Take 1 tablet (10 mg total) by mouth daily., Disp: 30 tablet, Rfl: 2   Ascorbic Acid (VITAMIN C) 500 MG CAPS, Take 500 mg by mouth daily. , Disp: , Rfl:    aspirin EC 81 MG tablet, Take 1 tablet (81 mg total) by mouth daily., Disp: , Rfl:    atorvastatin (LIPITOR) 40 MG tablet, Take 1 tablet (40 mg total) by mouth at bedtime., Disp: 90 tablet, Rfl: 1   b complex vitamins tablet, Take 1 tablet by mouth daily. , Disp: , Rfl:    cholecalciferol (VITAMIN D3) 25 MCG (1000 UNIT) tablet, Take 2 each by mouth daily. , Disp: , Rfl:    CINNAMON PO, Take 1 tablet by mouth daily. '1000mg'$  daily, Disp: , Rfl:    Coenzyme Q10 (COQ10) 200 MG CAPS, Take 1 tablet by mouth daily. , Disp: , Rfl:    empagliflozin (JARDIANCE) 25 MG TABS tablet, Take 1 tablet (25 mg total) by mouth  daily before breakfast. (Patient taking differently: Take 25 mg by mouth every other day.), Disp: 30 tablet, Rfl: 2   glucose blood test strip, Use to check blood sugars daily 3 times daily  E11.65, Disp: 200 each, Rfl: 3   insulin degludec (TRESIBA FLEXTOUCH) 100 UNIT/ML FlexTouch Pen, Inject 50 Units into the skin daily., Disp: 15 mL, Rfl: 0   Insulin Pen Needle (PEN NEEDLES) 32G X 4 MM  MISC, Use as directed, Disp: 35 each, Rfl: 0   lamoTRIgine (LAMICTAL) 150 MG tablet, Take 1 tablet (150 mg total) by mouth 2 (two) times daily., Disp: 180 tablet, Rfl: 1   metFORMIN (GLUCOPHAGE) 1000 MG tablet, TAKE 1 TABLET BY MOUTH 2 TIMES DAILY WITH MEALS, Disp: 180 tablet, Rfl: 1   metoprolol tartrate (LOPRESSOR) 25 MG tablet, TAKE 1 TABLET BY MOUTH 2 TIMES DAILY. GENERIC EQUIVALENT FOR LOPRESSOR., Disp: 180 tablet, Rfl: 3   Multiple Vitamin (MULTIVITAMIN WITH MINERALS) TABS tablet, Take 1 tablet by mouth daily., Disp: , Rfl:    Omega-3 Fatty Acids (FISH OIL) 1000 MG CAPS, Take 2 capsules by mouth daily., Disp: , Rfl:    omeprazole (PRILOSEC) 40 MG capsule, Take 1 capsule (40 mg total) by mouth daily., Disp: 90 capsule, Rfl: 3   polyethylene glycol-electrolytes (TRILYTE) 420 g solution, Take 4,000 mLs by mouth as directed., Disp: 4000 mL, Rfl: 0   Semaglutide (RYBELSUS) 14 MG TABS, Take 14 mg by mouth daily at 6 (six) AM., Disp: 30 tablet, Rfl: 0   valsartan (DIOVAN) 320 MG tablet, Take 1 tablet (320 mg total) by mouth daily., Disp: 90 tablet, Rfl: 3   vitamin B-12 (CYANOCOBALAMIN) 500 MCG tablet, Take 500 mcg by mouth daily., Disp: , Rfl:    FLUoxetine (PROZAC) 40 MG capsule, Take 1 capsule (40 mg total) by mouth every morning., Disp: 90 capsule, Rfl: 1   Garlic 123XX123 MG CAPS, Take 1 tablet by mouth daily.  (Patient not taking: Reported on 12/08/2020), Disp: , Rfl:    LORazepam (ATIVAN) 1 MG tablet, TAKE 1 TABLET BY MOUTH THREE TIMES DAILY AS NEEDED FOR ANXIETY. GENERIC EQUIVALENT FOR ATIVAN., Disp: 270 tablet, Rfl: 1 Medication Side Effects: none  Family Medical/ Social History: Changes?  No MENTAL HEALTH EXAM:  There were no vitals taken for this visit.There is no height or weight on file to calculate BMI.  General Appearance:  unable to assess  Eye Contact:   unable to assess  Speech:  Clear and Coherent and Normal Rate  Volume:  Normal  Mood:  Euthymic  Affect:   unable to assess   Thought Process:  Goal Directed and Descriptions of Associations: Circumstantial  Orientation:  Full (Time, Place, and Person)  Thought Content: Logical   Suicidal Thoughts:  No  Homicidal Thoughts:  No  Memory:  WNL  Judgement:  Good  Insight:  Good  Psychomotor Activity:   unable to assess  Concentration:  Concentration: Good and Attention Span: Good  Recall:  Good  Fund of Knowledge: Good  Language: Good  Assets:  Desire for Improvement  ADL's:  Intact  Cognition: WNL  Prognosis:  Good    DIAGNOSES:    ICD-10-CM   1. Recurrent major depressive disorder, in partial remission ([place])  F33.41     2. Generalized anxiety disorder  F41.1       Receiving Psychotherapy: No    RECOMMENDATIONS:  PDMP was reviewed. Ativan last filled 11/07/2020.  I provided 30 minutes of face to face time during this encounter,  including time spent before and after the visit in records review, medical decision making, and charting.  Doing well with psych meds so no changes are needed.  He understands the increased risk of falls with benzodiazepines.  He accepts that risk. Continue Prozac 40 mg every morning. Continue Lamictal 150 mg 1 p.o. twice daily. Continue Ativan 1 mg, 1 p.o. 3 times daily as needed. Return in 6 months.  Donnal Moat, PA-C

## 2020-12-31 ENCOUNTER — Other Ambulatory Visit: Payer: Self-pay

## 2020-12-31 ENCOUNTER — Ambulatory Visit (INDEPENDENT_AMBULATORY_CARE_PROVIDER_SITE_OTHER): Payer: Medicare Other | Admitting: Nurse Practitioner

## 2020-12-31 ENCOUNTER — Encounter: Payer: Self-pay | Admitting: Nurse Practitioner

## 2020-12-31 ENCOUNTER — Telehealth: Payer: Self-pay

## 2020-12-31 VITALS — BP 128/74 | HR 81 | Temp 97.8°F | Ht 69.0 in | Wt 236.0 lb

## 2020-12-31 DIAGNOSIS — E782 Mixed hyperlipidemia: Secondary | ICD-10-CM

## 2020-12-31 DIAGNOSIS — I10 Essential (primary) hypertension: Secondary | ICD-10-CM

## 2020-12-31 DIAGNOSIS — E119 Type 2 diabetes mellitus without complications: Secondary | ICD-10-CM

## 2020-12-31 DIAGNOSIS — Z23 Encounter for immunization: Secondary | ICD-10-CM

## 2020-12-31 DIAGNOSIS — I700 Atherosclerosis of aorta: Secondary | ICD-10-CM

## 2020-12-31 DIAGNOSIS — R351 Nocturia: Secondary | ICD-10-CM

## 2020-12-31 DIAGNOSIS — Z79899 Other long term (current) drug therapy: Secondary | ICD-10-CM

## 2020-12-31 DIAGNOSIS — M791 Myalgia, unspecified site: Secondary | ICD-10-CM

## 2020-12-31 NOTE — Chronic Care Management (AMB) (Signed)
    Chronic Care Management Pharmacy Assistant   Name: SHANA SALBERG  MRN: [id number] DOB: [birth year]  Reason for Encounter: Patient Assistance Coordination/ ActX Study  12/31/2020- The ActX pharmacogenomics process and benefits of testing was discussed with the patient. After the discussion, the patient made an informed consent to testing and the sample was collected and mailed to the external lab.  Patient assistance application for renewal of Jardiance 25 mg also filled out for [facility] patient assistance program. Patient signed application, awaiting provider signature, will attach existing income and will fax to [facility]. Application signed by provider, faxed to [facility].    Pattricia Boss, [place] Pharmacist Assistant [phone]

## 2020-12-31 NOTE — Patient Instructions (Signed)
Type 2 Diabetes Mellitus, Diagnosis, Adult °Type 2 diabetes (type 2 diabetes mellitus) is a long-term (chronic) disease. It may happen when there is one or both of these problems: °The pancreas does not make enough insulin. °The body does not react in a normal way to insulin that it makes. °Insulin lets sugars go into cells in your body. If you have type 2 diabetes, sugars cannot get into your cells. Sugars build up in the blood. This causes high blood sugar. °What are the causes? °The exact cause of this condition is not known. °What increases the risk? °Having type 2 diabetes in your family. °Being overweight or very overweight. °Not being active. °Your body not reacting in a normal way to the insulin it makes. °Having higher than normal blood sugar over time. °Having a type of diabetes when you were pregnant. °Having a condition that causes small fluid-filled sacs on your ovaries. °What are the signs or symptoms? °At first, you may have no symptoms. You will get symptoms slowly. They may include: °More thirst than normal. °More hunger than normal. °Needing to pee more than normal. °Losing weight without trying. °Feeling tired. °Feeling weak. °Seeing things blurry. °Dark patches on your skin. °How is this treated? °This condition may be treated by a diabetes expert. You may need to: °Follow an eating plan made by a food expert (dietitian). °Get regular exercise. °Find ways to deal with stress. °Check blood sugar as often as told. °Take medicines. °Your doctor will set treatment goals for you. Your blood sugar should be at these levels: °Before meals: 80-130 mg/dL (4.4-7.2 mmol/L). °After meals: below 180 mg/dL (10 mmol/L). °Over the last 2-3 months: less than 7%. °Follow these instructions at home: °Medicines °Take your diabetes medicines or insulin every day. °Take medicines as told to help you prevent other problems caused by this condition. You may need: °Aspirin. °Medicine to lower cholesterol. °Medicine to  control blood pressure. °Questions to ask your doctor °Should I meet with a diabetes educator? °What medicines do I need, and when should I take them? °What will I need to treat my condition at home? °When should I check my blood sugar? °Where can I find a support group? °Who can I call if I have questions? °When is my next doctor visit? °General instructions °Take over-the-counter and prescription medicines only as told by your doctor. °Keep all follow-up visits. °Where to find more information °For help and guidance and more information about diabetes, please go to: °American Diabetes Association ([name]): www.diabetes.org °American Association of Diabetes Care and Education Specialists (ADCES): www.diabeteseducator.org °International Diabetes Federation (IDF): www.idf.org °Contact a doctor if: °Your blood sugar is at or above 240 mg/dL (13.3 mmol/L) for 2 days in a row. °You have been sick for 2 days or more, and you are not getting better. °You have had a fever for 2 days or more, and you are not getting better. °You have any of these problems for more than 6 hours: °You cannot eat or drink. °You feel like you may vomit. °You vomit. °You have watery poop (diarrhea). °Get help right away if: °Your blood sugar is lower than 54 mg/dL (3 mmol/L). °You feel mixed up (confused). °You have trouble thinking clearly. °You have trouble breathing. °You have medium or large ketone levels in your pee. °These symptoms may be an emergency. Get help right away. Call your local emergency services (911 in the [place] °Do not wait to see if the symptoms will go away. °Do not drive yourself   to the hospital. °Summary °Type 2 diabetes is a long-term disease. Your pancreas may not make enough insulin, or your body may not react in a normal way to insulin that it makes. °This condition is treated with an eating plan, lifestyle changes, and medicines. °Your doctor will set treatment goals for you. These will help you keep your blood sugar  in a healthy range. °Keep all follow-up visits. °This information is not intended to replace advice given to you by your health care provider. Make sure you discuss any questions you have with your health care provider. °Document Revised: 07/14/2020 Document Reviewed: 07/14/2020 °Elsevier Patient Education © 2022 [employer] ° °

## 2020-12-31 NOTE — Progress Notes (Signed)
I, [place] Hamilton,acting as a Education administrator for [employer], FNP.,have documented all relevant documentation on the behalf of [name], FNP,as directed by  [name], FNP while in the presence of [name], [place].  This visit occurred during the SARS-CoV-2 public health emergency.  Safety protocols were in place, including screening questions prior to the visit, additional usage of staff PPE, and extensive cleaning of exam room while observing appropriate contact time as indicated for disinfecting solutions.  Subjective:     Patient ID: Robert Walters , male    DOB: January 23, 1956 , 65 y.o.   MRN: [id number]   Chief Complaint  Patient presents with   Hypertension   Diabetes    HPI  Pt is here today to follow up on d/m he doesn't have any other concerns going on. He would like to get his flu shot today. He does complain of neck pain, ongoing for 3 months, when he turns from side to side, he states it hurts.   Wt Readings from Last 3 Encounters: 12/31/20 : 236 lb (107 kg) 11/20/20 : 231 lb 9.6 oz (105.1 kg) 09/30/20 : 235 lb (106.6 kg)     Hypertension This is a chronic problem. The current episode started more than 1 year ago. The problem is unchanged. The problem is controlled. Pertinent negatives include no anxiety, chest pain, headaches or palpitations. There are no associated agents to hypertension. Risk factors for coronary artery disease include obesity and sedentary lifestyle. There are no compliance problems.  There is no history of angina. There is no history of chronic renal disease.  Diabetes He presents for his follow-up diabetic visit. He has type 2 diabetes mellitus. His disease course has been stable. There are no hypoglycemic associated symptoms. Pertinent negatives for hypoglycemia include no dizziness or headaches. There are no diabetic associated symptoms. Pertinent negatives for diabetes include no chest pain, no fatigue, no polydipsia, no polyphagia and no polyuria.  There are no hypoglycemic complications. Symptoms are stable. Diabetic complications include heart disease. Risk factors for coronary artery disease include diabetes mellitus, sedentary lifestyle, male sex and obesity. Current diabetic treatment includes oral agent (monotherapy). He is compliant with treatment all of the time. He is following a generally unhealthy diet. He has not had a previous visit with a dietitian. He rarely participates in exercise. (Blood sugars in morning 140-156) An ACE inhibitor/angiotensin II receptor blocker is being taken. He does not see a podiatrist.Eye exam is current.    Past Medical History:  Diagnosis Date   Acid reflux disease    Bipolar 1 disorder, mixed (HCC)    BPH (benign prostatic hypertrophy)    CVA (cerebral vascular accident) ([place]) 07/2015   Gastritis    H/O hiatal hernia    Hyperlipidemia    Hypertension    Normal nuclear stress test Dec 2011   Obesity    TIA (transient ischemic attack) 05/15/2012   "they think I've had one this am @ 0130" (05/15/2012)   Type II diabetes mellitus ([place])    Vertigo      Family History  Problem Relation Age of Onset   Hyperlipidemia Father        in a nursing home   Colon cancer Father    Stroke Mother 66     Current Outpatient Medications:    alfuzosin (UROXATRAL) 10 MG 24 hr tablet, Take 1 tablet (10 mg total) by mouth daily., Disp: 30 tablet, Rfl: 2   Ascorbic Acid (VITAMIN C) 500 MG CAPS, Take  500 mg by mouth daily. , Disp: , Rfl:    aspirin EC 81 MG tablet, Take 1 tablet (81 mg total) by mouth daily., Disp: , Rfl:    atorvastatin (LIPITOR) 40 MG tablet, Take 1 tablet (40 mg total) by mouth at bedtime., Disp: 90 tablet, Rfl: 1   b complex vitamins tablet, Take 1 tablet by mouth daily. , Disp: , Rfl:    cholecalciferol (VITAMIN D3) 25 MCG (1000 UNIT) tablet, Take 2 each by mouth daily. , Disp: , Rfl:    CINNAMON PO, Take 1 tablet by mouth daily. $RemoveBefo'1000mg'yvrkVAVATFV$  daily, Disp: , Rfl:    Coenzyme Q10 (COQ10) 200 MG  CAPS, Take 1 tablet by mouth daily. , Disp: , Rfl:    FLUoxetine (PROZAC) 40 MG capsule, Take 1 capsule (40 mg total) by mouth every morning., Disp: 90 capsule, Rfl: 1   glucose blood test strip, Use to check blood sugars daily 3 times daily  E11.65, Disp: 200 each, Rfl: 3   insulin degludec (TRESIBA FLEXTOUCH) 100 UNIT/ML FlexTouch Pen, Inject 50 Units into the skin daily., Disp: 15 mL, Rfl: 0   Insulin Pen Needle (PEN NEEDLES) 32G X 4 MM MISC, Use as directed, Disp: 35 each, Rfl: 0   lamoTRIgine (LAMICTAL) 150 MG tablet, Take 1 tablet (150 mg total) by mouth 2 (two) times daily., Disp: 180 tablet, Rfl: 1   LORazepam (ATIVAN) 1 MG tablet, TAKE 1 TABLET BY MOUTH THREE TIMES DAILY AS NEEDED FOR ANXIETY. GENERIC EQUIVALENT FOR ATIVAN., Disp: 270 tablet, Rfl: 1   metFORMIN (GLUCOPHAGE) 1000 MG tablet, TAKE 1 TABLET BY MOUTH 2 TIMES DAILY WITH MEALS, Disp: 180 tablet, Rfl: 1   metoprolol tartrate (LOPRESSOR) 25 MG tablet, TAKE 1 TABLET BY MOUTH 2 TIMES DAILY. GENERIC EQUIVALENT FOR LOPRESSOR., Disp: 180 tablet, Rfl: 3   Multiple Vitamin (MULTIVITAMIN WITH MINERALS) TABS tablet, Take 1 tablet by mouth daily., Disp: , Rfl:    Omega-3 Fatty Acids (FISH OIL) 1000 MG CAPS, Take 2 capsules by mouth daily., Disp: , Rfl:    omeprazole (PRILOSEC) 40 MG capsule, Take 1 capsule (40 mg total) by mouth daily., Disp: 90 capsule, Rfl: 3   polyethylene glycol-electrolytes (TRILYTE) 420 g solution, Take 4,000 mLs by mouth as directed., Disp: 4000 mL, Rfl: 0   Semaglutide (RYBELSUS) 14 MG TABS, Take 14 mg by mouth daily at 6 (six) AM., Disp: 30 tablet, Rfl: 0   valsartan (DIOVAN) 320 MG tablet, Take 1 tablet (320 mg total) by mouth daily., Disp: 90 tablet, Rfl: 3   vitamin B-12 (CYANOCOBALAMIN) 500 MCG tablet, Take 500 mcg by mouth daily., Disp: , Rfl:    empagliflozin (JARDIANCE) 25 MG TABS tablet, Take 1 tablet (25 mg total) by mouth daily before breakfast. (Patient taking differently: Take 25 mg by mouth every other  day.), Disp: 30 tablet, Rfl: 2   Garlic 2446 MG CAPS, Take 1 tablet by mouth daily.  (Patient not taking: No sig reported), Disp: , Rfl:    No Known Allergies   Review of Systems  Constitutional: Negative.  Negative for fatigue.  HENT: Negative.    Eyes: Negative.   Respiratory: Negative.    Cardiovascular: Negative.  Negative for chest pain, palpitations and leg swelling.  Gastrointestinal: Negative.   Endocrine: Negative.  Negative for polydipsia, polyphagia and polyuria.  Genitourinary: Negative.   Musculoskeletal: Negative.   Skin: Negative.   Allergic/Immunologic: Negative.   Neurological: Negative.  Negative for dizziness and headaches.  Hematological: Negative.   Psychiatric/Behavioral: Negative.  Today's Vitals   12/31/20 0943  BP: 128/74  Pulse: 81  Temp: 97.8 F (36.6 C)  SpO2: 99%  Weight: 236 lb (107 kg)  Height: $Remove'5\' 9"'uvjVWWf$  (1.753 m)  PainSc: 0-No pain   Body mass index is 34.85 kg/m.  Wt Readings from Last 3 Encounters:  12/31/20 236 lb (107 kg)  11/20/20 231 lb 9.6 oz (105.1 kg)  09/30/20 235 lb (106.6 kg)    Objective:  Physical Exam Vitals reviewed.  Constitutional:      General: He is not in acute distress.    Appearance: Normal appearance. He is obese.  Eyes:     Extraocular Movements: Extraocular movements intact.     Conjunctiva/sclera: Conjunctivae normal.     Pupils: Pupils are equal, round, and reactive to light.  Neck:     Comments: Range of motion is guarded slightly Cardiovascular:     Rate and Rhythm: Normal rate and regular rhythm.     Pulses: Normal pulses.     Heart sounds: Normal heart sounds. No murmur heard. Pulmonary:     Effort: Pulmonary effort is normal. No respiratory distress.     Breath sounds: Normal breath sounds. No wheezing.  Musculoskeletal:     Cervical back: Neck supple. Tenderness (left posterior neck) present. No rigidity.  Skin:    General: Skin is warm and dry.     Capillary Refill: Capillary refill takes  less than 2 seconds.  Neurological:     General: No focal deficit present.     Mental Status: He is alert and oriented to person, place, and time.     Cranial Nerves: No cranial nerve deficit.     Motor: No weakness.  Psychiatric:        Mood and Affect: Mood normal.        Behavior: Behavior normal.        Thought Content: Thought content normal.        Judgment: Judgment normal.        Assessment And Plan:     1. Type 2 diabetes mellitus without complication, without long-term current use of insulin (HCC) Comments: Doing well with Tresiba, Rybelsus and Jardiance (needs patient assistance) - CMP14+EGFR - Hemoglobin A1c  2. Essential hypertension Comments: Blood pressure is well controlled Continue current medications - CMP14+EGFR  3. Mixed hyperlipidemia Comments: Continue statin and follow up with Cardiology - Lipid panel  4. Nocturia Comments: Will check PSA, he also followed by Urology  - PSA  5. Need for influenza vaccination Influenza vaccine administered Encouraged to take Tylenol as needed for fever or muscle aches. - Flu vaccine HIGH DOSE PF (Fluzone High dose)  6. Muscle tension pain Comments: Neck muscles are tense, encouraged to do neck exercises, if not better by next visit will order neck xray  7. Polypharmacy - [facility] Home     Patient was given opportunity to ask questions. Patient verbalized understanding of the plan and was able to repeat key elements of the plan. All questions were answered to their satisfaction.  [name], FNP   I, [name], FNP, have reviewed all documentation for this visit. The documentation on 12/31/20 for the exam, diagnosis, procedures, and orders are all accurate and complete.   IF YOU HAVE BEEN REFERRED TO A SPECIALIST, IT MAY TAKE 1-2 WEEKS TO SCHEDULE/PROCESS THE REFERRAL. IF YOU HAVE NOT HEARD FROM US/SPECIALIST IN TWO WEEKS, PLEASE GIVE [place] A CALL AT [phone] X [phone].   THE PATIENT IS  ENCOURAGED TO  PRACTICE SOCIAL DISTANCING DUE TO THE COVID-19 PANDEMIC.

## 2021-01-01 LAB — CMP14+EGFR
ALT: 42 IU/L (ref 0–44)
AST: 34 IU/L (ref 0–40)
Albumin/Globulin Ratio: 2 (ref 1.2–2.2)
Albumin: 4.5 g/dL (ref 3.8–4.8)
Alkaline Phosphatase: 155 IU/L — ABNORMAL HIGH (ref 44–121)
BUN/Creatinine Ratio: 16 (ref 10–24)
BUN: 24 mg/dL (ref 8–27)
Bilirubin Total: 0.5 mg/dL (ref 0.0–1.2)
CO2: 18 mmol/L — ABNORMAL LOW (ref 20–29)
Calcium: 9.7 mg/dL (ref 8.6–10.2)
Chloride: 101 mmol/L (ref 96–106)
Creatinine, Ser: 1.5 mg/dL — ABNORMAL HIGH (ref 0.76–1.27)
Globulin, Total: 2.3 g/dL (ref 1.5–4.5)
Glucose: 116 mg/dL — ABNORMAL HIGH (ref 65–99)
Potassium: 4.7 mmol/L (ref 3.5–5.2)
Sodium: 137 mmol/L (ref 134–144)
Total Protein: 6.8 g/dL (ref 6.0–8.5)
eGFR: 51 mL/min/{1.73_m2} — ABNORMAL LOW (ref >59)

## 2021-01-01 LAB — HEMOGLOBIN A1C
Est. average glucose Bld gHb Est-mCnc: 146 mg/dL
Hgb A1c MFr Bld: 6.7 % — ABNORMAL HIGH (ref 4.8–5.6)

## 2021-01-01 LAB — LIPID PANEL
Chol/HDL Ratio: 4.9 ratio (ref 0.0–5.0)
Cholesterol, Total: 188 mg/dL (ref 100–199)
HDL: 38 mg/dL — ABNORMAL LOW (ref >39)
LDL Chol Calc (NIH): 91 mg/dL (ref 0–99)
Triglycerides: 357 mg/dL — ABNORMAL HIGH (ref 0–149)
VLDL Cholesterol Cal: 59 mg/dL — ABNORMAL HIGH (ref 5–40)

## 2021-01-01 LAB — PSA: Prostate Specific Ag, Serum: 0.6 ng/mL (ref 0.0–4.0)

## 2021-01-02 ENCOUNTER — Ambulatory Visit (INDEPENDENT_AMBULATORY_CARE_PROVIDER_SITE_OTHER): Payer: Medicare Other

## 2021-01-02 ENCOUNTER — Telehealth: Payer: Medicare Other

## 2021-01-02 DIAGNOSIS — E119 Type 2 diabetes mellitus without complications: Secondary | ICD-10-CM

## 2021-01-02 DIAGNOSIS — N1831 Chronic kidney disease, stage 3a: Secondary | ICD-10-CM

## 2021-01-02 DIAGNOSIS — I10 Essential (primary) hypertension: Secondary | ICD-10-CM

## 2021-01-02 DIAGNOSIS — E782 Mixed hyperlipidemia: Secondary | ICD-10-CM

## 2021-01-12 NOTE — Patient Instructions (Signed)
Goals Addressed      Chronic Kidney Disease - Disease Progression Prevented or Minimized   On track    Timeframe:  Long-Range Goal Priority:  Medium Start Date:  07/01/20                           Expected End Date:  07/01/21  Next Follow Up Date: 04/20/21  Patient Goals:  Increase your water intake to 64 oz daily  Keep diabetes and BP under good control Follow dietary recommendations                  Glycemic Management Optimized   On track    Timeframe:  Long-Range Goal Priority:  Medium Start Date: 07/01/20                            Expected End Date: 07/01/21                      Next Follow Up Date: 04/20/21  Patient Goals:  Continue to monitor CBG's at home  Continue to adhere to dietary and exercise recommendations  Increase water to 64 oz daily        Mixed Hyperlipidemia - Disease Progression minimized or avoided   On track    Timeframe:  Long-Range Goal Priority:  High Start Date:  07/01/20                           Expected End Date:  07/01/21  Next Follow Up Date: 04/20/21  Self administer medications as prescribed Attend all scheduled provider appointments Call pharmacy for medication refills Adhere to heart healthy diet as discussed

## 2021-01-12 NOTE — Chronic Care Management (AMB) (Signed)
Chronic Care Management   CCM RN Visit Note  01/02/2021 Name: Robert Walters MRN: [id number] DOB: 03/20/1956  Subjective: Robert Walters is a 65 y.o. year old male who is a primary care patient of [name], [place]. The care management team was consulted for assistance with disease management and care coordination needs.    Engaged with patient by telephone for follow up visit in response to provider referral for case management and/or care coordination services.   Consent to Services:  The patient was given information about Chronic Care Management services, agreed to services, and gave verbal consent prior to initiation of services.  Please see initial visit note for detailed documentation.   Patient agreed to services and verbal consent obtained.   Assessment: Review of patient past medical history, allergies, medications, health status, including review of consultants reports, laboratory and other test data, was performed as part of comprehensive evaluation and provision of chronic care management services.   SDOH (Social Determinants of Health) assessments and interventions performed:  Yes, no acute challenges   CCM Care Plan  No Known Allergies  Outpatient Encounter Medications as of 01/02/2021  Medication Sig Note   alfuzosin (UROXATRAL) 10 MG 24 hr tablet Take 1 tablet (10 mg total) by mouth daily.    Ascorbic Acid (VITAMIN C) 500 MG CAPS Take 500 mg by mouth daily.     aspirin EC 81 MG tablet Take 1 tablet (81 mg total) by mouth daily.    atorvastatin (LIPITOR) 40 MG tablet Take 1 tablet (40 mg total) by mouth at bedtime.    b complex vitamins tablet Take 1 tablet by mouth daily.     cholecalciferol (VITAMIN D3) 25 MCG (1000 UNIT) tablet Take 2 each by mouth daily.     CINNAMON PO Take 1 tablet by mouth daily. 1050m daily    Coenzyme Q10 (COQ10) 200 MG CAPS Take 1 tablet by mouth daily.     empagliflozin (JARDIANCE) 25 MG TABS tablet Take 1 tablet (25 mg total) by mouth  daily before breakfast. (Patient taking differently: Take 25 mg by mouth every other day.) 11/20/2020: Takes daily   FLUoxetine (PROZAC) 40 MG capsule Take 1 capsule (40 mg total) by mouth every morning.    Garlic 14098MG CAPS Take 1 tablet by mouth daily.  (Patient not taking: No sig reported)    glucose blood test strip Use to check blood sugars daily 3 times daily  E11.65    insulin degludec (TRESIBA FLEXTOUCH) 100 UNIT/ML FlexTouch Pen Inject 50 Units into the skin daily.    Insulin Pen Needle (PEN NEEDLES) 32G X 4 MM MISC Use as directed    lamoTRIgine (LAMICTAL) 150 MG tablet Take 1 tablet (150 mg total) by mouth 2 (two) times daily.    LORazepam (ATIVAN) 1 MG tablet TAKE 1 TABLET BY MOUTH THREE TIMES DAILY AS NEEDED FOR ANXIETY. GENERIC EQUIVALENT FOR ATIVAN.    metFORMIN (GLUCOPHAGE) 1000 MG tablet TAKE 1 TABLET BY MOUTH 2 TIMES DAILY WITH MEALS    metoprolol tartrate (LOPRESSOR) 25 MG tablet TAKE 1 TABLET BY MOUTH 2 TIMES DAILY. GENERIC EQUIVALENT FOR LOPRESSOR.    Multiple Vitamin (MULTIVITAMIN WITH MINERALS) TABS tablet Take 1 tablet by mouth daily.    Omega-3 Fatty Acids (FISH OIL) 1000 MG CAPS Take 2 capsules by mouth daily.    omeprazole (PRILOSEC) 40 MG capsule Take 1 capsule (40 mg total) by mouth daily.    polyethylene glycol-electrolytes (TRILYTE) 420 g solution Take 4,000 mLs  by mouth as directed.    Semaglutide (RYBELSUS) 14 MG TABS Take 14 mg by mouth daily at 6 (six) AM.    valsartan (DIOVAN) 320 MG tablet Take 1 tablet (320 mg total) by mouth daily.    vitamin B-12 (CYANOCOBALAMIN) 500 MCG tablet Take 500 mcg by mouth daily.    No facility-administered encounter medications on file as of 01/02/2021.    Patient Active Problem List   Diagnosis Date Noted   NASH (nonalcoholic steatohepatitis) 11/20/2020   Dysphagia 11/20/2020   Chronic diastolic heart failure ([place]) 03/27/2019   Fatty liver 12/26/2018   Elevated LFTs 12/26/2018   History of colonic polyps 12/26/2018    Fall 08/28/2018   Rib pain on left side 08/28/2018   Anxiety 03/31/2018   Bipolar II disorder ([place]) 03/31/2018   Insomnia 03/31/2018   Type 2 diabetes mellitus without complication, without long-term current use of insulin ([place]) 03/24/2018   Bile leak 09/15/2016   Sepsis ([place]) 08/23/2016   Cholecystitis    Stroke ([place]) 07/25/2015   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 07/25/2015   Dizziness 08/09/2014   TIA (transient ischemic attack) 05/15/2012   Blurred vision 05/15/2012   Ataxia 05/15/2012   Diabetes mellitus type 2 with complications ([place]) 93/79/0240   BPH (benign prostatic hyperplasia) 05/15/2012   GERD (gastroesophageal reflux disease) 05/15/2012   Obesity 05/15/2012   Bipolar 1 disorder ([place]) 05/15/2012   Essential hypertension 10/30/2010   Cardiovascular risk factor 10/30/2010   Pure hypercholesterolemia 12/05/2008    Conditions to be addressed/monitored: DMII, Essential Hypertension, Stage 3a CKD, Mixed Hyperlipidemia   Care Plan : Chronic Kidney (Adult)  Updates made by Lynne Logan, RN since 01/02/2021 12:00 AM     Problem: Disease Progression   Priority: Medium     Long-Range Goal: Disease Progression Prevented or Minimized   Start Date: 07/01/2020  Expected End Date: 07/01/2021  Recent Progress: On track  Priority: Medium  Note:   CARE PLAN ENTRY (see longitudinal plan of care for additional care plan information)  Objective:  Lab Results  Component Value Date   HGBA1C 6.7 (H) 12/31/2020   Lab Results  Component Value Date   CREATININE 1.50 (H) 12/31/2020   CREATININE 1.56 (H) 09/30/2020   CREATININE 1.50 (H) 06/30/2020   Lab Results  Component Value Date   EGFR 51 (L) 12/31/2020  Current Barriers:  Knowledge Deficits related to disease process and Self Health management of CKD Chronic Disease Management support and education needs related to DMII, Essential Hypertension, Stage 3a CKD, Mixed Hyperlipidemia  Nurse Case Manager Clinical Goal(s):  Patient  will work with the CCM RN CM and PCP to address needs related to disease education and support to improve CKD as evidence by disease progression will be minimized or avoided  CCM RN CM Interventions:  01/02/21 successful outbound call completed with patient  Inter-disciplinary care team collaboration (see longitudinal plan of care) Evaluation of current treatment plan related to CKD and patient's adherence to plan as established by provider. Review of patient status, including review of consultant's reports, relevant laboratory and other test results, and medications completed. Reviewed medications with patient and discussed importance of medication adherence Provided education to patient re: kidney decline with recent GFR < 51; Educated patient on stages of kidney disease and interventions to help improve kidney function, including improved diabetic control, increasing water and implementing routine exercise   Mailed printed educational material related to Eating Right with Chronic Kidney disease  Discussed plans with patient for ongoing  care management follow up and provided patient with direct contact information for care management team Patient Self Care Activities:  Continue to adhere to MD recommendations for CKD  Continue to keep all scheduled follow up appointments Take medications as directed  Let your healthcare team know if you are unable to take your medications Call your pharmacy for refills at least 7 days prior to running out of medication Patient Goals:  Increase your water intake to 64 oz daily  Keep diabetes and BP under good control Follow dietary recommendations  Follow Up Plan: Telephone follow up appointment with care management team member scheduled for: 04/20/21    Care Plan : Diabetes Type 2 (Adult)  Updates made by Lynne Logan, RN since 01/02/2021 12:00 AM     Problem: Glycemic Management (Diabetes, Type 2)   Priority: Medium     Long-Range Goal: Glycemic  Management Optimized   Start Date: 07/01/2020  Expected End Date: 07/01/2021  Recent Progress: On track  Priority: Medium  Note:   CARE PLAN ENTRY (see longitudinal plan of care for additional care plan information) Objective:  Lab Results  Component Value Date   HGBA1C 6.7 (H) 12/31/2020   Lab Results  Component Value Date   CREATININE 1.50 (H) 12/31/2020   CREATININE 1.56 (H) 09/30/2020   CREATININE 1.50 (H) 06/30/2020   Lab Results  Component Value Date   EGFR 51 (L) 12/31/2020  Current Barriers:  Knowledge Deficits related to disease process and Self Health management of DM  Chronic Disease Management support and education needs related to DMII, Essential Hypertension, Stage 3a CKD, Mixed Hyperlipidemia  Nurse Case Manager Clinical Goal(s):  Patient will work with CCM RN CM and PCP to address needs related to disease education and support to improve Self Health management of DM as evidence by patient will lower and maintain A1c <7.0 CCM RN CM Interventions:  01/02/21 completed successful outbound call with patient  Inter-disciplinary care team collaboration (see longitudinal plan of care) Evaluation of current treatment plan related to DM and patient's adherence to plan as established by provider. Reviewed current A1c has decreased to 6.7; Educated on basic disease process including complications if uncontrolled; Educated on dietary and exercise recommendations, Provided recommendations for monitoring CBG twice daily before meals and at bedtime and logging BS; Educated on daily glycemic control FBS 80-130; <180 after meals; Educated on importance of implementing exercise to daily routine (150 min weekly), maintaining healthy weight and adhering to diabetic diet: Determined patient is now checking his CBG's at home 1-2 times daily and is logging his readings; Educated on 15'15' rule    Reviewed medications with patient and discussed indication, dosage and frequency of prescribed  medications for diabetes Mailed printed educational materials related to Low Carb Smoothies; Mediterranean diet; Paramedic with Diabetes  Discussed plans with patient for ongoing care management follow up and provided patient with direct contact information for care management team  Patient Self Care Activities:  Self administer medications as prescribed  Attend all scheduled provider appointments Call pharmacy for medication refills Call provider office for new concerns or questions Patient Goals:  Continue to monitor CBG's at home  Continue to adhere to dietary and exercise recommendations  Increase water to 64 oz daily   Follow Up Plan: Telephone follow up appointment with care management team member scheduled for: 04/20/21      Care Plan : Mixed Hyperlipidemia  Updates made by Lynne Logan, RN since 01/02/2021 12:00 AM  Problem: Mixed Hyperlipidemia   Priority: High     Long-Range Goal: Mixed Hyperlipidemia - Disease Progression minimized or avoided   Start Date: 07/01/2020  Expected End Date: 07/01/2021  Recent Progress: On track  Priority: High  Note:   CARE PLAN ENTRY (see longitudinal plan of care for additional care plan information)  Lipid Panel     Component Value Date/Time   CHOL 188 12/31/2020 1057   TRIG 357 (H) 12/31/2020 1057   HDL 38 (L) 12/31/2020 1057   CHOLHDL 4.9 12/31/2020 1057   CHOLHDL 7.1 07/26/2015 0647   VLDL UNABLE TO CALCULATE IF TRIGLYCERIDE OVER 400 mg/dL 07/26/2015 0647   LDLCALC 91 12/31/2020 1057   LDLDIRECT 124.3 11/10/2010 0953   LABVLDL 59 (H) 12/31/2020 1057    Current Barriers:  Knowledge Deficits related to disease process and Self Health management of Mixed Hyperlipidemia  Chronic Disease Management support and education needs related to DMII, Essential Hypertension, Stage 3a CKD, Mixed Hyperlipidemia  Nurse Case Manager Clinical Goal(s):  Patient will work with the CCM team and PCP to address needs related to disease  education and support to improve Self Health management of Mixed Hyperlipidemia as evidence by patient will lower and maintain Cholesterol levels within normal limits   CCM RN CM Interventions:  01/02/21 completed successful outbound call with patient  Inter-disciplinary care team collaboration (see longitudinal plan of care) Evaluation of current treatment plan related to Mixed Hyperlipidemia and patient's adherence to plan as established by provider. Review of patient status, including review of consultant's reports, relevant laboratory and other test results, and medications completed. Reviewed medications with patient and discussed importance of medication adherence Provided education to patient re: current lipid panel and target ranges to shoot for; Educated on dietary and exercise recommendations; Educated on disease process and potential complications if left untreated Mailed printed educational material related to Cooking to Lower High Cholesterol  Discussed plans with patient for ongoing care management follow up and provided patient with direct contact information for care management team Patient Self Care Activities/Goals:  Self administer medications as prescribed Attend all scheduled provider appointments Call pharmacy for medication refills Adhere to heart healthy diet as discussed   Follow Up Plan: Telephone follow up appointment with care management team member scheduled for: 04/20/21    Plan:Telephone follow up appointment with care management team member scheduled for:  04/20/21  Barb Merino, RN, BSN, CCM Care Management Coordinator [facility]  Direct Phone: [phone]

## 2021-01-13 ENCOUNTER — Telehealth: Payer: Self-pay

## 2021-01-13 NOTE — Chronic Care Management (AMB) (Signed)
  Robert Walters was reminded to have all medications, supplements and any blood glucose and blood pressure readings available for review with [name], Pharm. D, at his telephone visit on 01-14-2021 at 9:00.   Questions: Have you had any recent office visit or specialist visit outside of [place]? Patient states no  Are there any concerns you would like to discuss during your office visit? Patient states he would like to discuss lab results   Are you having any problems obtaining your medications? (Whether it pharmacy issues or cost) Patient states no  If patient has any PAP medications ask if they are having any problems getting their PAP medication or refill? Patient states no  Care Gaps: Shingrix overdue PNA Vac overdue RAF= 2.051% Medicare wellness 04-01-2021  Star Rating Drug: Atorvastatin 40 mg- Last filled 01-12-2021 90 [facility] Rybelsus 14 mg- Patient assistance Valsartan 320 mg- Last filled 11-11-2020 [facility] Metformin 1000 mg- Last filled 09-24-2020 [facility] (Patient states he has plenty on hand)  Any gaps in medications fill history? No  [place] Pharmacist Assistant [phone]

## 2021-01-14 ENCOUNTER — Telehealth: Payer: Self-pay

## 2021-01-14 ENCOUNTER — Ambulatory Visit: Payer: Medicare Other

## 2021-01-14 DIAGNOSIS — E119 Type 2 diabetes mellitus without complications: Secondary | ICD-10-CM

## 2021-01-14 DIAGNOSIS — I10 Essential (primary) hypertension: Secondary | ICD-10-CM

## 2021-01-14 DIAGNOSIS — E782 Mixed hyperlipidemia: Secondary | ICD-10-CM

## 2021-01-14 NOTE — Patient Instructions (Addendum)
Visit Information It was great speaking with you today!  Please let me know if you have any questions about our visit.   Goals Addressed             This Visit's Progress    Manage My Medicine       Timeframe:  Long-Range Goal Priority:  High Start Date:       07/07/2020 Expected End Date:                       Follow Up Date: 05/19/2020   In Progress:  - call for medicine refill 2 or 3 days before it runs out - call if I am sick and can't take my medicine - keep a list of all the medicines I take; vitamins and herbals too - use a pillbox to sort medicine - use an alarm clock or phone to remind me to take my medicine    Why is this important?   These steps will help you keep on track with your medicines.        Patient Care Plan: CCM Pharmacy Care Plan     Problem Identified: HTN, HLD, DM II   Priority: High     Long-Range Goal: Disease Management   Start Date: 01/14/2021  Recent Progress: On track  Priority: High  Note:   Current Barriers:  Unable to independently afford treatment regimen Unable to independently monitor therapeutic efficacy Unable to achieve control of cholesterol    Pharmacist Clinical Goal(s):  Patient will verbalize ability to afford treatment regimen achieve adherence to monitoring guidelines and medication adherence to achieve therapeutic efficacy achieve control of cholesterol as evidenced by LDL <70 through collaboration with PharmD and provider.   Interventions: 1:1 collaboration with [name], FNP regarding development and update of comprehensive plan of care as evidenced by provider attestation and co-signature Inter-disciplinary care team collaboration (see longitudinal plan of care) Comprehensive medication review performed; medication list updated in electronic medical record  Hypertension (BP goal <130/80) -Controlled -Current treatment: Valsartan 320 mg tablet once per day Metoprolol tartrate 25 mg tablet take 1  tablet twice per day  -Medications previously tried: Atenolol 100 mg tablet, Losartan 100 mg tablet,   -Current home readings: 115/70 -Current dietary habits: patient reports that he has cut back on fried and fatty foods.  -Current exercise habits: paitent is doing some walking about twice per week.  -Denies hypotensive/hypertensive symptoms -Educated on BP goals and benefits of medications for prevention of heart attack, stroke and kidney damage; Daily salt intake goal < 2300 mg; Exercise goal of 150 minutes per week; -Counseled to monitor BP at home at least once or twice per week, document, and provide log at future appointments -Recommended to continue current medication  Hyperlipidemia: (LDL goal < 70) -Uncontrolled -Current treatment: Omega 3- fish oil - 2 capsules by mouth daily  Atorvastatin 40 mg tablet once per day at bedtime.  Patient has recently got a refill of Atorvastatin 40 mg tablet, so patient will complete the tablet by taking 2 tablets.  -Medications previously tried: Zetia 10 mg, Rosuvastatin 20-40 mg tablet, Simvastatin 80 mg tablet,   -Current dietary patterns: avoiding most fried and fatty foods.  -Current exercise habits: going to contact insurance to find out about his silver sneakers membership and where it is available.  -Educated on Benefits of statin for ASCVD risk reduction; Importance of limiting foods high in cholesterol; Exercise goal of 150 minutes per week; -Counseled on  diet and exercise extensively Recommended to continue current medication  Diabetes (A1c goal <7%) -Controlled -Current medications: Semaglutide 14 mg tablet once a day.  Metformin 1000 mg tablet twice per day.  [name]- inject 50 units into the skin daily Jardiance 25 mg taking 1 tablet by mouth daily before breakfast.  -Medications previously tried: glipizide 10 mg tablet   -Current home glucose readings fasting glucose: 110 -Denies hypoglycemic/hyperglycemic  symptoms -Current meal patterns: will discuss in more detail during next office visit, this morning he is eating [place].  -Current exercise: please see hypertension  -Educated on A1c and blood sugar goals; Exercise goal of 150 minutes per week; Benefits of routine self-monitoring of blood sugar; -Counseled to check feet daily and get yearly eye exams -Counseled on diet and exercise extensively Recommended to continue current medication  Health Maintenance -Vaccine gaps: Influenza Vaccine - received during last visit with PCP on 12/31/2020, Shingrix vaccine - second dose, received at [facility] will confirm date and have uploaded to patients chart.  -Educated on vaccination status and pending need for Pneumonia vaccine.   Patient Goals/Self-Care Activities Patient will:  - take medications as prescribed target a minimum of 150 minutes of moderate intensity exercise weekly  Follow Up Plan: The patient has been provided with contact information for the care management team and has been advised to call with any health related questions or concerns.       Patient agreed to services and verbal consent obtained.   The patient verbalized understanding of instructions, educational materials, and care plan provided today and agreed to receive a mailed copy of patient instructions, educational materials, and care plan.   Robert Walters, PharmD Clinical Pharmacist [facility] [phone]

## 2021-01-14 NOTE — Progress Notes (Signed)
Chronic Care Management Pharmacy Note  01/14/2021 Name:  Robert Walters MRN:  [id number] DOB:  [birth year]  Summary: Patient would like to know more about his vaccination status.   Recommendations/Changes made from today's visit: Recommend patient continue current medication regimen.  Recommend patient start drinking more water.   Plan: Recommend patient drink more water, at least 8 glasses of water per day.  Increase the amount of exercise he is doing by walking more.    Subjective: Robert Walters is an 65 y.o. year old male who is a primary patient of [name], [place].  The CCM team was consulted for assistance with disease management and care coordination needs.    Engaged with patient by telephone for follow up visit in response to provider referral for pharmacy case management and/or care coordination services.   Consent to Services:  The patient was given information about Chronic Care Management services, agreed to services, and gave verbal consent prior to initiation of services.  Please see initial visit note for detailed documentation.   Patient Care Team: [name], FNP as PCP - General (General Practice) Nahser, Wonda Cheng, MD as PCP - Cardiology (Cardiology) Rex Kras Claudette Stapler, RN as Case Manager [place]audill, Kennieth Francois, [facility] (Inactive) (Pharmacist)  Recent office visits: 12/31/2020 PCP OV   Recent consult visits: 11/20/2020 Gastroenterology OV  09/15/2020  Cardiology [facility] visits: None in previous 6 months   Objective:  Lab Results  Component Value Date   CREATININE 1.50 (H) 12/31/2020   BUN 24 12/31/2020   GFRNONAA 50 (L) 03/26/2020   GFRAA 58 (L) 03/26/2020   NA 137 12/31/2020   K 4.7 12/31/2020   CALCIUM 9.7 12/31/2020   CO2 18 (L) 12/31/2020   GLUCOSE 116 (H) 12/31/2020    Lab Results  Component Value Date/Time   HGBA1C 6.7 (H) 12/31/2020 10:57 AM   HGBA1C 7.1 (H) 09/30/2020 10:55 AM   MICROALBUR 10 03/26/2020 01:16 PM   MICROALBUR  30 11/06/2019 04:23 PM    Last diabetic Eye exam:  Lab Results  Component Value Date/Time   HMDIABEYEEXA No Retinopathy 01/02/2020 12:00 AM    Last diabetic Foot exam: No results found for: HMDIABFOOTEX   Lab Results  Component Value Date   CHOL 188 12/31/2020   HDL 38 (L) 12/31/2020   LDLCALC 91 12/31/2020   LDLDIRECT 124.3 11/10/2010   TRIG 357 (H) 12/31/2020   CHOLHDL 4.9 12/31/2020    Hepatic Function Latest Ref Rng & Units 12/31/2020 09/30/2020 06/30/2020  Total Protein 6.0 - 8.5 g/dL 6.8 7.4 7.5  Albumin 3.8 - 4.8 g/dL 4.5 4.9(H) 5.0(H)  AST 0 - 40 IU/L 34 42(H) 51(H)  ALT 0 - 44 IU/L 42 57(H) 78(H)  Alk Phosphatase 44 - 121 IU/L 155(H) 159(H) 186(H)  Total Bilirubin 0.0 - 1.2 mg/dL 0.5 0.5 0.4  Bilirubin, Direct 0.0 - 0.2 mg/dL - - -    Lab Results  Component Value Date/Time   TSH 1.650 06/30/2020 03:50 PM   TSH 2.700 11/29/2018 10:46 AM   FREET4 1.69 03/24/2009 01:00 PM    CBC Latest Ref Rng & Units 03/26/2020 11/29/2018 09/01/2016  WBC 3.4 - 10.8 x10E3/uL 6.9 7.0 11.4(H)  Hemoglobin 13.0 - 17.7 g/dL 13.5 12.8(L) 12.1(L)  Hematocrit 37.5 - 51.0 % 40.2 37.6 36.0(L)  Platelets 150 - 450 x10E3/uL 178 161 315    No results found for: VD25OH  Clinical ASCVD: Yes  The ASCVD Risk score (Arnett DK, et al., 2019) failed  to calculate for the following reasons:   The patient has a prior MI or stroke diagnosis    Depression screen [facility] 2/9 12/31/2020 09/30/2020 06/30/2020  Decreased Interest 0 0 0  Down, Depressed, Hopeless 0 0 0  PHQ - 2 Score 0 0 0  Altered sleeping 0 0 0  Tired, decreased energy 0 0 1  Change in appetite 0 0 0  Feeling bad or failure about yourself  0 0 0  Trouble concentrating 0 0 0  Moving slowly or fidgety/restless 0 0 0  Suicidal thoughts 0 0 0  PHQ-9 Score 0 0 1  Difficult doing work/chores Not difficult at all - Not difficult at all  Some recent data might be hidden     Social History   Tobacco Use  Smoking Status Never  Smokeless  Tobacco Never   BP Readings from Last 3 Encounters:  12/31/20 128/74  11/20/20 132/87  09/30/20 132/76   Pulse Readings from Last 3 Encounters:  12/31/20 81  11/20/20 (!) 118  09/30/20 99   Wt Readings from Last 3 Encounters:  12/31/20 236 lb (107 kg)  11/20/20 231 lb 9.6 oz (105.1 kg)  09/30/20 235 lb (106.6 kg)   BMI Readings from Last 3 Encounters:  12/31/20 34.85 kg/m  11/20/20 34.20 kg/m  09/30/20 34.70 kg/m    Assessment/Interventions: Review of patient past medical history, allergies, medications, health status, including review of consultants reports, laboratory and other test data, was performed as part of comprehensive evaluation and provision of chronic care management services.   SDOH:  (Social Determinants of Health) assessments and interventions performed: Yes  SDOH Screenings   Alcohol Screen: Not on file  Depression (PHQ2-9): Low Risk    PHQ-2 Score: 0  Financial Resource Strain: Low Risk    Difficulty of Paying Living Expenses: Not hard at all  Food Insecurity: No Food Insecurity   Worried About Charity fundraiser in the Last Year: Never true   Ran Out of Food in the Last Year: Never true  Housing: Not on file  Physical Activity: Inactive   Days of Exercise per Week: 0 days   Minutes of Exercise per Session: 0 min  Social Connections: Not on file  Stress: No Stress Concern Present   Feeling of Stress : Not at all  Tobacco Use: Low Risk    Smoking Tobacco Use: Never   Smokeless Tobacco Use: Never  Transportation Needs: No Transportation Needs   Lack of Transportation (Medical): No   Lack of Transportation (Non-Medical): No    CCM Care Plan  No Known Allergies  Medications Reviewed Today     Reviewed by Mayford Knife, [facility] (Pharmacist) on 01/14/21 at [place]  Med List Status: <None>   Medication Order Taking? Sig Documenting Provider Last Dose Status Informant  alfuzosin (UROXATRAL) 10 MG 24 hr tablet [id number] Yes Take 1 tablet (10 mg  total) by mouth daily. Franchot Gallo, MD Taking Active   Ascorbic Acid (VITAMIN C) 500 MG CAPS [id number] Yes Take 500 mg by mouth daily.  [provider] Taking Active   aspirin EC 81 MG tablet [id number] Yes Take 1 tablet (81 mg total) by mouth daily. Nahser, Wonda Cheng, MD Taking Active   atorvastatin (LIPITOR) 40 MG tablet [id number] Yes Take 1 tablet (40 mg total) by mouth at bedtime. [name], FNP Taking Active   b complex vitamins tablet [id number] Yes Take 1 tablet by mouth daily.  [provider] Taking Active   cholecalciferol (  VITAMIN D3) 25 MCG (1000 UNIT) tablet [id number] Yes Take 2 each by mouth daily.  [provider] Taking Active Self  CINNAMON PO [id number] Yes Take 1 tablet by mouth daily. 1047m daily [provider] Taking Active Self  Coenzyme Q10 (COQ10) 200 MG CAPS [id number] Take 1 tablet by mouth daily.  [provider] Taking Active Self  empagliflozin (JARDIANCE) 25 MG TABS tablet [id number] Take 1 tablet (25 mg total) by mouth daily before breakfast.  Patient taking differently: Take 25 mg by mouth every other day.   MMinette Brine FNP Taking Active            Med Note ([facility]   Thu Nov 20, 2020 11:14 AM) TDewaine Congerdaily  FLUoxetine (PROZAC) 40 MG capsule [id number] Take 1 capsule (40 mg total) by mouth every morning. HAlen BlewTaking Active   Garlic 17672MG CAPS [id number] Take 1 tablet by mouth daily. [provider] Taking Active   glucose blood test strip [id number] Use to check blood sugars daily 3 times daily  E11.65 MMinette Brine FNP Taking Active   insulin degludec (TRESIBA FLEXTOUCH) 100 UNIT/ML FlexTouch Pen [id number] Inject 50 Units into the skin daily. MMinette Brine FNP Taking Active   Insulin Pen Needle (PEN NEEDLES) 32G X 4 MM MISC [id number] Use as directed MMinette Brine FNP Taking Active   lamoTRIgine (LAMICTAL) 150 MG tablet [id number] Take 1 tablet  (150 mg total) by mouth 2 (two) times daily. HAddison Lank PA-C Taking Active   LORazepam (ATIVAN) 1 MG tablet [id number] TAKE 1 TABLET BY MOUTH THREE TIMES DAILY AS NEEDED FOR ANXIETY. GENERIC EQUIVALENT FOR ATIVAN. HDonnal MoatT, PA-C Taking Active   metFORMIN (GLUCOPHAGE) 1000 MG tablet [id number] TAKE 1 TABLET BY MOUTH 2 TIMES DAILY WITH MEALS MMinette Brine FNP Taking Active   metoprolol tartrate (LOPRESSOR) 25 MG tablet [id number] TAKE 1 TABLET BY MOUTH 2 TIMES DAILY. GENERIC EQUIVALENT FOR LOPRESSOR. Nahser, PWonda Cheng MD Taking Active   Multiple Vitamin (MULTIVITAMIN WITH MINERALS) TABS tablet [id number] Take 1 tablet by mouth daily. [provider] Taking Active Self  Omega-3 Fatty Acids (FISH OIL) 1000 MG CAPS [id number] Take 2 capsules by mouth daily. [provider] Taking Active   omeprazole (PRILOSEC) 40 MG capsule [id number] Take 1 capsule (40 mg total) by mouth daily. CMontez Morita DQuillian Quince MD Taking Active   polyethylene glycol-electrolytes (TRILYTE) 420 g solution [id number] Take 4,000 mLs by mouth as directed. CMontez Morita DQuillian Quince MD Taking Active   Semaglutide ([facility] 14 MG TABS [id number] Take 14 mg by mouth daily at 6 (six) AM. MMinette Brine FNP Taking Active   valsartan (DIOVAN) 320 MG tablet [id number] Take 1 tablet (320 mg total) by mouth daily. Nahser, PWonda Cheng MD Taking Active   vitamin B-12 (CYANOCOBALAMIN) 500 MCG tablet [id number] Take 500 mcg by mouth daily. [provider]  Active             Patient Active Problem List   Diagnosis Date Noted   NASH (nonalcoholic steatohepatitis) 11/20/2020   Dysphagia 11/20/2020   Chronic diastolic heart failure ([place] 03/27/2019   Fatty liver 12/26/2018   Elevated LFTs 12/26/2018   History of colonic polyps 12/26/2018   Fall 08/28/2018   Rib pain on left side 08/28/2018   Anxiety 03/31/2018   Bipolar II disorder ([place] 03/31/2018   Insomnia  03/31/2018   Type  2 diabetes mellitus without complication, without long-term current use of insulin ([place]) 03/24/2018   Bile leak 09/15/2016   Sepsis ([place]) 08/23/2016   Cholecystitis    Stroke ([place]) 07/25/2015   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 07/25/2015   Dizziness 08/09/2014   TIA (transient ischemic attack) 05/15/2012   Blurred vision 05/15/2012   Ataxia 05/15/2012   Diabetes mellitus type 2 with complications ([place]) 65/07/5463   BPH (benign prostatic hyperplasia) 05/15/2012   GERD (gastroesophageal reflux disease) 05/15/2012   Obesity 05/15/2012   Bipolar 1 disorder ([place]) 05/15/2012   Essential hypertension 10/30/2010   Cardiovascular risk factor 10/30/2010   Pure hypercholesterolemia 12/05/2008    Immunization History  Administered Date(s) Administered   DTaP 10/05/2011   Influenza, High Dose Seasonal PF 01/15/2019   Influenza-Unspecified 03/03/2012, 03/16/2018, 03/21/2020   Moderna Sars-Covid-2 Vaccination 12/07/2019, 01/04/2020, 07/18/2020, 12/05/2020   Pneumococcal Conjugate-13 03/21/2019   Pneumococcal Polysaccharide-23 05/16/2012   Tdap 10/01/2020   Zoster Recombinat (Shingrix) 11/19/2019    Conditions to be addressed/monitored:  Hyperlipidemia and Diabetes  Care Plan : [place]  Updates made by Mayford Knife, [place] since 01/14/2021 12:00 AM     Problem: HTN, HLD, DM II   Priority: High     Long-Range Goal: Disease Management   Start Date: 01/14/2021  Recent Progress: On track  Priority: High  Note:   Current Barriers:  Unable to independently afford treatment regimen Unable to independently monitor therapeutic efficacy Unable to achieve control of cholesterol    Pharmacist Clinical Goal(s):  Patient will verbalize ability to afford treatment regimen achieve adherence to monitoring guidelines and medication adherence to achieve therapeutic efficacy achieve control of cholesterol as evidenced by LDL <70 through collaboration with PharmD and  provider.   Interventions: 1:1 collaboration with [name], FNP regarding development and update of comprehensive plan of care as evidenced by provider attestation and co-signature Inter-disciplinary care team collaboration (see longitudinal plan of care) Comprehensive medication review performed; medication list updated in electronic medical record  Hypertension (BP goal <130/80) -Controlled -Current treatment: Valsartan 320 mg tablet once per day Metoprolol tartrate 25 mg tablet take 1 tablet twice per day  -Medications previously tried: Atenolol 100 mg tablet, Losartan 100 mg tablet,   -Current home readings: 115/70 -Current dietary habits: patient reports that he has cut back on fried and fatty foods.  -Current exercise habits: paitent is doing some walking about twice per week.  -Denies hypotensive/hypertensive symptoms -Educated on BP goals and benefits of medications for prevention of heart attack, stroke and kidney damage; Daily salt intake goal < 2300 mg; Exercise goal of 150 minutes per week; -Counseled to monitor BP at home at least once or twice per week, document, and provide log at future appointments -Recommended to continue current medication  Hyperlipidemia: (LDL goal < 70) -Uncontrolled -Current treatment: Omega 3- fish oil - 2 capsules by mouth daily  Atorvastatin 40 mg tablet once per day at bedtime.  Patient has recently got a refill of Atorvastatin 40 mg tablet, so patient will complete the tablet by taking 2 tablets.  -Medications previously tried: Zetia 10 mg, Rosuvastatin 20-40 mg tablet, Simvastatin 80 mg tablet,   -Current dietary patterns: avoiding most fried and fatty foods.  -Current exercise habits: going to contact insurance to find out about his silver sneakers membership and where it is available.  -Educated on Benefits of statin for ASCVD risk reduction; Importance of limiting foods high in cholesterol; Exercise goal of 150 minutes per  week; -  Counseled on diet and exercise extensively Recommended to continue current medication  Diabetes (A1c goal <7%) -Controlled -Current medications: Semaglutide 14 mg tablet once a day.  Metformin 1000 mg tablet twice per day.  [name]- inject 50 units into the skin daily Jardiance 25 mg taking 1 tablet by mouth daily before breakfast.  -Medications previously tried: glipizide 10 mg tablet   -Current home glucose readings fasting glucose: 110 -Denies hypoglycemic/hyperglycemic symptoms -Current meal patterns: will discuss in more detail during next office visit, this morning he is eating [place].  -Current exercise: please see hypertension  -Educated on A1c and blood sugar goals; Exercise goal of 150 minutes per week; Benefits of routine self-monitoring of blood sugar; -Counseled to check feet daily and get yearly eye exams -Counseled on diet and exercise extensively Recommended to continue current medication  Health Maintenance -Vaccine gaps: Influenza Vaccine - received during last visit with PCP on 12/31/2020, Shingrix vaccine - second dose, received at [facility] will confirm date and have uploaded to patients chart.  -Educated on vaccination status and pending need for Pneumonia vaccine.    Patient Goals/Self-Care Activities Patient will:  - take medications as prescribed target a minimum of 150 minutes of moderate intensity exercise weekly  Follow Up Plan: The patient has been provided with contact information for the care management team and has been advised to call with any health related questions or concerns.       Medication Assistance:  Rybelsus and Jardiance obtained through [employer], and [employer] medication assistance program.  Enrollment ends 04/2021.   Compliance/Adherence/Medication fill history: Care Gaps: Shingrix Vaccine - 11/19/2019, second dose of Shingrix Vaccine given at [facility]  Influenza Vaccine - at PCP office  12/31/2020 Opthalmology Exam- due for one  Star-Rating Drugs: Atorvastatin 40 mg tablet Metformin 1000 mg tablet  Rybelsus 14 mg tablet  Valsartan 320 mg tablet Jardiance 25 mg tablet  Patient's preferred pharmacy is:  [facility] ([place]) [place], [place] - [address] AT [address] [place] [address] Phone: [phone] Fax: [phone]  [place], [place] [address] Phone: [phone] Fax: [phone]  [place], [place] - [address] [place] [address] Phone: [phone] Fax: [phone]  [id number] [place] [address] [place]: [phone]: [phone] Uses pill box? Yes Pt endorses 95% compliance  We discussed: Current pharmacy is preferred with insurance plan and patient is satisfied with pharmacy services Patient decided to: Continue current medication management strategy  Care Plan and Follow Up Patient Decision:  Patient agrees to Care Plan and Follow-up.  Plan: Telephone follow up appointment with care management team member scheduled for:  05/19/2021 and The patient has been provided with contact information for the care management team and has been advised to call with any health related questions or concerns.   VOrlando Penner PharmD Clinical Pharmacist [facility] [phone]

## 2021-01-19 DIAGNOSIS — I739 Peripheral vascular disease, unspecified: Secondary | ICD-10-CM | POA: Diagnosis not present

## 2021-01-21 ENCOUNTER — Telehealth: Payer: Self-pay

## 2021-01-21 NOTE — Chronic Care Management (AMB) (Signed)
Patient stated he is running low on Jardiance and Rybelsus. Contacted [facility] and was informed [name] will be shipped out on 02-10-2021 and Pattricia Boss informed me that Rybelsus next fill is in October. Spoke with patient and he stated he will have enough supply until next shipment.  [place] Pharmacist Assistant [phone]

## 2021-01-28 ENCOUNTER — Other Ambulatory Visit: Payer: Self-pay

## 2021-01-28 MED ORDER — EMPAGLIFLOZIN 25 MG PO TABS: 25.0000 mg | ORAL_TABLET | Freq: Every day | ORAL | 2 refills | Status: DC

## 2021-01-29 NOTE — Patient Instructions (Signed)
Robert Walters  01/29/2021     @PREFPERIOPPHARMACY @   Your procedure is scheduled on  02/03/2021.   Report to [facility] at  [phone] A.M.   Call this number if you have problems the morning of surgery:  [phone]   Remember:  Follow the diet and prep instructions given to you by the office.    If you take your tresiba at night, only take 27.5 units the night before your procedure.     DO NOT take any medications for diabetes the morning of your procedure.     Take these medicines the morning of surgery with A SIP OF WATER         uroxatrol, lamictal, ativan (if needed), metoprolol, omeprazole.     Do not wear jewelry, make-up or nail polish.  Do not wear lotions, powders, or perfumes, or deodorant.  Do not shave 48 hours prior to surgery.  Men may shave face and neck.  Do not bring valuables to the hospital.  [facility] is not responsible for any belongings or valuables.  Contacts, dentures or bridgework may not be worn into surgery.  Leave your suitcase in the car.  After surgery it may be brought to your room.  For patients admitted to the hospital, discharge time will be determined by your treatment team.  Patients discharged the day of surgery will not be allowed to drive home and must  have someone with them for 24 hours.    Special instructions:   DO NOT smoke tobacco or vape for 24 hours before your procedure.  Please read over the following fact sheets that you were given. Anesthesia Post-op Instructions and Care and Recovery After Surgery      Upper Endoscopy, Adult, Care After This sheet gives you information about how to care for yourself after your procedure. Your health care provider may also give you more specific instructions. If you have problems or questions, contact your health care provider. What can I expect after the procedure? After the procedure, it is common to have: A sore throat. Mild stomach pain or  discomfort. Bloating. Nausea. Follow these instructions at home:  Follow instructions from your health care provider about what to eat or drink after your procedure. Return to your normal activities as told by your health care provider. Ask your health care provider what activities are safe for you. Take over-the-counter and prescription medicines only as told by your health care provider. If you were given a sedative during the procedure, it can affect you for several hours. Do not drive or operate machinery until your health care provider says that it is safe. Keep all follow-up visits as told by your health care provider. This is important. Contact a health care provider if you have: A sore throat that lasts longer than one day. Trouble swallowing. Get help right away if: You vomit blood or your vomit looks like coffee grounds. You have: A fever. Bloody, black, or tarry stools. A severe sore throat or you cannot swallow. Difficulty breathing. Severe pain in your chest or abdomen. Summary After the procedure, it is common to have a sore throat, mild stomach discomfort, bloating, and nausea. If you were given a sedative during the procedure, it can affect you for several hours. Do not drive or operate machinery until your health care provider says that it is safe. Follow instructions from your health care provider about what to eat or drink after your  procedure. Return to your normal activities as told by your health care provider. This information is not intended to replace advice given to you by your health care provider. Make sure you discuss any questions you have with your health care provider. Document Revised: 04/17/2019 Document Reviewed: 09/19/2017 Elsevier Patient Education  2022 [employer]. Colonoscopy, Adult, Care After This sheet gives you information about how to care for yourself after your procedure. Your health care provider may also give you more specific  instructions. If you have problems or questions, contact your health care provider. What can I expect after the procedure? After the procedure, it is common to have: A small amount of blood in your stool for 24 hours after the procedure. Some gas. Mild cramping or bloating of your abdomen. Follow these instructions at home: Eating and drinking  Drink enough fluid to keep your urine pale yellow. Follow instructions from your health care provider about eating or drinking restrictions. Resume your normal diet as instructed by your health care provider. Avoid heavy or fried foods that are hard to digest. Activity Rest as told by your health care provider. Avoid sitting for a long time without moving. Get up to take short walks every 1-2 hours. This is important to improve blood flow and breathing. Ask for help if you feel weak or unsteady. Return to your normal activities as told by your health care provider. Ask your health care provider what activities are safe for you. Managing cramping and bloating  Try walking around when you have cramps or feel bloated. Apply heat to your abdomen as told by your health care provider. Use the heat source that your health care provider recommends, such as a moist heat pack or a heating pad. Place a towel between your skin and the heat source. Leave the heat on for 20-30 minutes. Remove the heat if your skin turns bright red. This is especially important if you are unable to feel pain, heat, or cold. You may have a greater risk of getting burned. General instructions If you were given a sedative during the procedure, it can affect you for several hours. Do not drive or operate machinery until your health care provider says that it is safe. For the first 24 hours after the procedure: Do not sign important documents. Do not drink alcohol. Do your regular daily activities at a slower pace than normal. Eat soft foods that are easy to digest. Take  over-the-counter and prescription medicines only as told by your health care provider. Keep all follow-up visits as told by your health care provider. This is important. Contact a health care provider if: You have blood in your stool 2-3 days after the procedure. Get help right away if you have: More than a small spotting of blood in your stool. Large blood clots in your stool. Swelling of your abdomen. Nausea or vomiting. A fever. Increasing pain in your abdomen that is not relieved with medicine. Summary After the procedure, it is common to have a small amount of blood in your stool. You may also have mild cramping and bloating of your abdomen. If you were given a sedative during the procedure, it can affect you for several hours. Do not drive or operate machinery until your health care provider says that it is safe. Get help right away if you have a lot of blood in your stool, nausea or vomiting, a fever, or increased pain in your abdomen. This information is not intended to replace advice given  to you by your health care provider. Make sure you discuss any questions you have with your health care provider. Document Revised: 04/13/2019 Document Reviewed: 11/13/2018 Elsevier Patient Education  [place] After This sheet gives you information about how to care for yourself after your procedure. Your health care provider may also give you more specific instructions. If you have problems or questions, contact your health care provider. What can I expect after the procedure? After the procedure, it is common to have: Tiredness. Forgetfulness about what happened after the procedure. Impaired judgment for important decisions. Nausea or vomiting. Some difficulty with balance. Follow these instructions at home: For the time period you were told by your health care provider:   Rest as needed. Do not participate in activities where you could fall or  become injured. Do not drive or use machinery. Do not drink alcohol. Do not take sleeping pills or medicines that cause drowsiness. Do not make important decisions or sign legal documents. Do not take care of children on your own. Eating and drinking Follow the diet that is recommended by your health care provider. Drink enough fluid to keep your urine pale yellow. If you vomit: Drink water, juice, or soup when you can drink without vomiting. Make sure you have little or no nausea before eating solid foods. General instructions Have a responsible adult stay with you for the time you are told. It is important to have someone help care for you until you are awake and alert. Take over-the-counter and prescription medicines only as told by your health care provider. If you have sleep apnea, surgery and certain medicines can increase your risk for breathing problems. Follow instructions from your health care provider about wearing your sleep device: Anytime you are sleeping, including during daytime naps. While taking prescription pain medicines, sleeping medicines, or medicines that make you drowsy. Avoid smoking. Keep all follow-up visits as told by your health care provider. This is important. Contact a health care provider if: You keep feeling nauseous or you keep vomiting. You feel light-headed. You are still sleepy or having trouble with balance after 24 hours. You develop a rash. You have a fever. You have redness or swelling around the IV site. Get help right away if: You have trouble breathing. You have new-onset confusion at home. Summary For several hours after your procedure, you may feel tired. You may also be forgetful and have poor judgment. Have a responsible adult stay with you for the time you are told. It is important to have someone help care for you until you are awake and alert. Rest as told. Do not drive or operate machinery. Do not drink alcohol or take sleeping  pills. Get help right away if you have trouble breathing, or if you suddenly become confused. This information is not intended to replace advice given to you by your health care provider. Make sure you discuss any questions you have with your health care provider. Document Revised: 01/03/2020 Document Reviewed: 03/22/2019 Elsevier Patient Education  2022 [employer].

## 2021-01-30 ENCOUNTER — Encounter (HOSPITAL_COMMUNITY)
Admission: RE | Admit: 2021-01-30 | Discharge: 2021-01-30 | Disposition: A | Discharge status: Home / Self Care | Payer: Medicare Other | Source: Ambulatory Visit | Attending: Gastroenterology | Admitting: Gastroenterology

## 2021-01-30 ENCOUNTER — Telehealth: Payer: Medicare Other

## 2021-01-30 ENCOUNTER — Other Ambulatory Visit: Payer: Self-pay

## 2021-01-30 ENCOUNTER — Encounter (HOSPITAL_COMMUNITY): Payer: Self-pay

## 2021-01-30 ENCOUNTER — Telehealth: Payer: Self-pay

## 2021-01-30 DIAGNOSIS — E119 Type 2 diabetes mellitus without complications: Secondary | ICD-10-CM | POA: Diagnosis not present

## 2021-01-30 DIAGNOSIS — E782 Mixed hyperlipidemia: Secondary | ICD-10-CM | POA: Diagnosis not present

## 2021-01-30 DIAGNOSIS — N1831 Chronic kidney disease, stage 3a: Secondary | ICD-10-CM

## 2021-01-30 DIAGNOSIS — I10 Essential (primary) hypertension: Secondary | ICD-10-CM

## 2021-01-30 NOTE — Telephone Encounter (Signed)
  Care Management   Follow Up Note   01/30/2021 Name: Robert Walters MRN: [id number] DOB: November 19, 1955   Referred by: [name], FNP Reason for referral : No chief complaint on file.   An unsuccessful telephone outreach was attempted today. The patient was referred to the case management team for assistance with care management and care coordination.   Follow Up Plan: Telephone follow up appointment with care management team member scheduled for: 04/03/21  Barb Merino, RN, BSN, CCM Care Management Coordinator [facility]  Direct Phone: [phone]

## 2021-02-02 ENCOUNTER — Telehealth (INDEPENDENT_AMBULATORY_CARE_PROVIDER_SITE_OTHER): Payer: Self-pay

## 2021-02-02 MED ORDER — PEG 3350-KCL-NA BICARB-NACL 420 G PO SOLR: 4000.0000 mL | ORAL | 0 refills | Status: DC

## 2021-02-02 NOTE — Telephone Encounter (Signed)
Robert Walters, CMA  

## 2021-02-03 ENCOUNTER — Ambulatory Visit (HOSPITAL_COMMUNITY)
Admission: RE | Admit: 2021-02-03 | Discharge: 2021-02-03 | Disposition: A | Discharge status: Home / Self Care | Payer: Medicare Other | Attending: Gastroenterology | Admitting: Gastroenterology

## 2021-02-03 ENCOUNTER — Ambulatory Visit (HOSPITAL_COMMUNITY): Payer: Medicare Other | Admitting: Anesthesiology

## 2021-02-03 ENCOUNTER — Encounter (HOSPITAL_COMMUNITY): Payer: Self-pay | Admitting: Gastroenterology

## 2021-02-03 ENCOUNTER — Other Ambulatory Visit (INDEPENDENT_AMBULATORY_CARE_PROVIDER_SITE_OTHER): Payer: Self-pay

## 2021-02-03 ENCOUNTER — Encounter (HOSPITAL_COMMUNITY)
Admission: RE | Disposition: A | Discharge status: Home / Self Care | Payer: Self-pay | Source: Home / Self Care | Attending: Gastroenterology

## 2021-02-03 ENCOUNTER — Telehealth (INDEPENDENT_AMBULATORY_CARE_PROVIDER_SITE_OTHER): Payer: Self-pay

## 2021-02-03 DIAGNOSIS — N400 Enlarged prostate without lower urinary tract symptoms: Secondary | ICD-10-CM | POA: Diagnosis not present

## 2021-02-03 DIAGNOSIS — Z86010 Personal history of colonic polyps: Secondary | ICD-10-CM | POA: Insufficient documentation

## 2021-02-03 DIAGNOSIS — K7581 Nonalcoholic steatohepatitis (NASH): Secondary | ICD-10-CM | POA: Diagnosis not present

## 2021-02-03 DIAGNOSIS — Z79899 Other long term (current) drug therapy: Secondary | ICD-10-CM | POA: Diagnosis not present

## 2021-02-03 DIAGNOSIS — Z09 Encounter for follow-up examination after completed treatment for conditions other than malignant neoplasm: Secondary | ICD-10-CM | POA: Diagnosis not present

## 2021-02-03 DIAGNOSIS — Z7982 Long term (current) use of aspirin: Secondary | ICD-10-CM | POA: Insufficient documentation

## 2021-02-03 DIAGNOSIS — E785 Hyperlipidemia, unspecified: Secondary | ICD-10-CM | POA: Diagnosis not present

## 2021-02-03 DIAGNOSIS — F319 Bipolar disorder, unspecified: Secondary | ICD-10-CM | POA: Diagnosis not present

## 2021-02-03 DIAGNOSIS — K219 Gastro-esophageal reflux disease without esophagitis: Secondary | ICD-10-CM | POA: Insufficient documentation

## 2021-02-03 DIAGNOSIS — Z9049 Acquired absence of other specified parts of digestive tract: Secondary | ICD-10-CM | POA: Insufficient documentation

## 2021-02-03 DIAGNOSIS — Z794 Long term (current) use of insulin: Secondary | ICD-10-CM | POA: Insufficient documentation

## 2021-02-03 DIAGNOSIS — Z538 Procedure and treatment not carried out for other reasons: Secondary | ICD-10-CM | POA: Insufficient documentation

## 2021-02-03 DIAGNOSIS — Z91199 Patient's noncompliance with other medical treatment and regimen due to unspecified reason: Secondary | ICD-10-CM | POA: Diagnosis not present

## 2021-02-03 DIAGNOSIS — I110 Hypertensive heart disease with heart failure: Secondary | ICD-10-CM | POA: Diagnosis not present

## 2021-02-03 DIAGNOSIS — E119 Type 2 diabetes mellitus without complications: Secondary | ICD-10-CM | POA: Diagnosis not present

## 2021-02-03 DIAGNOSIS — Z8673 Personal history of transient ischemic attack (TIA), and cerebral infarction without residual deficits: Secondary | ICD-10-CM | POA: Diagnosis not present

## 2021-02-03 DIAGNOSIS — R1310 Dysphagia, unspecified: Secondary | ICD-10-CM | POA: Insufficient documentation

## 2021-02-03 DIAGNOSIS — I10 Essential (primary) hypertension: Secondary | ICD-10-CM | POA: Diagnosis not present

## 2021-02-03 DIAGNOSIS — I5032 Chronic diastolic (congestive) heart failure: Secondary | ICD-10-CM | POA: Diagnosis not present

## 2021-02-03 DIAGNOSIS — Z800 Family history of malignant neoplasm of digestive organs: Secondary | ICD-10-CM | POA: Insufficient documentation

## 2021-02-03 DIAGNOSIS — Z7984 Long term (current) use of oral hypoglycemic drugs: Secondary | ICD-10-CM | POA: Diagnosis not present

## 2021-02-03 HISTORY — PX: ESOPHAGEAL DILATION: SHX303

## 2021-02-03 HISTORY — PX: ESOPHAGOGASTRODUODENOSCOPY (EGD) WITH PROPOFOL: SHX5813

## 2021-02-03 HISTORY — PX: COLONOSCOPY WITH PROPOFOL: SHX5780

## 2021-02-03 LAB — GLUCOSE, CAPILLARY
Glucose-Capillary: 87 mg/dL (ref 70–99)
Glucose-Capillary: 105 mg/dL — ABNORMAL HIGH (ref 70–99)

## 2021-02-03 SURGERY — COLONOSCOPY WITH PROPOFOL
Anesthesia: General

## 2021-02-03 MED ORDER — PEG 3350-KCL-NA BICARB-NACL 420 G PO SOLR: 4000.0000 mL | ORAL | 0 refills | Status: DC

## 2021-02-03 MED ORDER — PROPOFOL 10 MG/ML IV BOLUS
INTRAVENOUS | Status: DC | PRN
  Administered 2021-02-03: 50 mg via INTRAVENOUS
  Administered 2021-02-03 (×3): 100 mg via INTRAVENOUS

## 2021-02-03 MED ORDER — PROPOFOL 500 MG/50ML IV EMUL
INTRAVENOUS | Status: DC | PRN
  Administered 2021-02-03: 75 ug/kg/min via INTRAVENOUS

## 2021-02-03 MED ORDER — LACTATED RINGERS IV SOLN: INTRAVENOUS | Status: DC

## 2021-02-03 NOTE — Anesthesia Procedure Notes (Signed)
Date/Time: 02/03/2021 7:32 AM Performed by: Vista Deck, CRNA Pre-anesthesia Checklist: Patient identified, Emergency Drugs available, Suction available, Timeout performed and Patient being monitored Patient Re-evaluated:Patient Re-evaluated prior to induction Oxygen Delivery Method: Nasal Cannula

## 2021-02-03 NOTE — Anesthesia Preprocedure Evaluation (Signed)
Anesthesia Evaluation  Patient identified by MRN, date of birth, ID band Patient awake    Reviewed: Allergy & Precautions, H&P , NPO status , Patient's Chart, lab work & pertinent test results, reviewed documented beta blocker date and time   Airway Mallampati: II  TM Distance: >3 FB Neck ROM: full    Dental no notable dental hx.    Pulmonary neg pulmonary ROS,    Pulmonary exam normal breath sounds clear to auscultation       Cardiovascular Exercise Tolerance: Good hypertension, negative cardio ROS   Rhythm:regular Rate:Normal     Neuro/Psych PSYCHIATRIC DISORDERS Anxiety Bipolar Disorder TIACVA, No Residual Symptoms    GI/Hepatic hiatal hernia, GERD  Medicated,(+) Hepatitis -, Autoimmune  Endo/Other  negative endocrine ROSdiabetes  Renal/GU negative Renal ROS  negative genitourinary   Musculoskeletal   Abdominal   Peds  Hematology negative hematology ROS (+)   Anesthesia Other Findings   Reproductive/Obstetrics negative OB ROS                             Anesthesia Physical Anesthesia Plan  ASA: 3  Anesthesia Plan: General   Post-op Pain Management:    Induction:   PONV Risk Score and Plan: Propofol infusion  Airway Management Planned:   Additional Equipment:   Intra-op Plan:   Post-operative Plan:   Informed Consent: I have reviewed the patients History and Physical, chart, labs and discussed the procedure including the risks, benefits and alternatives for the proposed anesthesia with the patient or authorized representative who has indicated his/her understanding and acceptance.     Dental Advisory Given  Plan Discussed with: CRNA  Anesthesia Plan Comments:         Anesthesia Quick Evaluation

## 2021-02-03 NOTE — Op Note (Addendum)
[facility] Patient Name: Robert Walters Procedure Date: 02/03/2021 6:57 AM MRN: [id number] Date of Birth: [birth year] Attending MD: Maylon Peppers ,  CSN: [id number] Age: 65 Admit Type: Outpatient Procedure:                Upper GI endoscopy Indications:              Dysphagia Providers:                Maylon Peppers, Hinton Rao, RN, Aram Candela Referring MD:              Medicines:                Monitored Anesthesia Care Complications:            No immediate complications. Estimated Blood Loss:     Estimated blood loss: none. Procedure:                Pre-Anesthesia Assessment:                           - Prior to the procedure, a History and Physical                            was performed, and patient medications, allergies                            and sensitivities were reviewed. The patient's                            tolerance of previous anesthesia was reviewed.                           - The risks and benefits of the procedure and the                            sedation options and risks were discussed with the                            patient. All questions were answered and informed                            consent was obtained.                           After obtaining informed consent, the endoscope was                            passed under direct vision. Throughout the                            procedure, the patient's blood pressure, pulse, and                            oxygen saturations were monitored continuously. The                            GIF-H190 ([id number]) scope was introduced  through the                            mouth, and advanced to the second part of duodenum.                            The upper GI endoscopy was accomplished without                            difficulty. The patient tolerated the procedure                            well. Scope In: 7:40:41 AM Scope Out: 7:49:13 AM Total Procedure Duration: 0 hours 8 minutes  32 seconds  Findings:      No endoscopic abnormality was evident in the esophagus to explain the       patient's complaint of dysphagia. It was decided, however, to proceed       with dilation of the entire esophagus. A guidewire was placed and the       scope was withdrawn. Dilation was performed with a Savary dilator with       mild resistance at 18 mm. Mucosal disruption was noted at the GE       junction.      The entire examined stomach was normal.      The examined duodenum was normal. Impression:               - No endoscopic esophageal abnormality to explain                            patient's dysphagia. Esophagus dilated. Dilated.                           - Normal stomach.                           - Normal examined duodenum.                           - No specimens collected. Moderate Sedation:      Per Anesthesia Care Recommendation:           - Discharge patient to home (ambulatory).                           - Resume previous diet.                           - Continue present medications. Procedure Code(s):        --- Professional ---                           [phone], Esophagogastroduodenoscopy, flexible,                            transoral; with insertion of guide wire followed by                            passage  of dilator(s) through esophagus over guide                            wire Diagnosis Code(s):        --- Professional ---                           R13.10, Dysphagia, unspecified CPT copyright 2019 American Medical Association. All rights reserved. The codes documented in this report are preliminary and upon coder review may  be revised to meet current compliance requirements. Maylon Peppers, MD Maylon Peppers,  02/03/2021 8:16:16 AM This report has been signed electronically. Number of Addenda: 0

## 2021-02-03 NOTE — H&P (View-Only) (Signed)
Robert Walters is an 65 y.o. male.   Chief Complaint: dysphagia and history of colon polyps HPI: Robert Walters is a 65 y.o. male with past medical history of GERD,  NASH, bipolar disorder, BPH, CVA, hyperlipidemia, hypertension, diabetes and TIA, who presents for evaluation of dysphagia and follow-up of colon polyps.  Patient reports intermittent episodes of dysphagia usually when he eats hamburgers.  Denies any heartburn or odynophagia.  No episodes of food impaction.  Last Endoscopy: 2019-moderate diffuse gastritis. Last Colonoscopy: 01/2018-family history of colon cancer in father with personal history of colon polyps, multiple polyps, 6 mm polyp rectum, 3 polyps mid sigmoid, 10 mm polyp mid ascending, sessile polyp cecum, polyp ascending x2.  Path with tubular adenomas, 3-year repeat recommended.  Past Medical History:  Diagnosis Date   Acid reflux disease    Bipolar 1 disorder, mixed (HCC)    BPH (benign prostatic hypertrophy)    CVA (cerebral vascular accident) ([place]) 07/2015   Gastritis    H/O hiatal hernia    Hyperlipidemia    Hypertension    Normal nuclear stress test Dec 2011   Obesity    TIA (transient ischemic attack) 05/15/2012   "they think I've had one this am @ 0130" (05/15/2012)   Type II diabetes mellitus ([place])    Vertigo     Past Surgical History:  Procedure Laterality Date   BILIARY STENT PLACEMENT N/A 09/17/2016   Procedure: BILIARY STENT PLACEMENT;  Surgeon: [name], MD;  Location[facility];  Service: Endoscopy;  Laterality: N/A;   CARDIOVASCULAR STRESS TEST  2009   EF 63%   CHOLECYSTECTOMY N/A 08/25/2016   Procedure: LAPAROSCOPIC CHOLECYSTECTOMY;  Surgeon: [name], MD;  Location: [facility] ORS;  Service: General;  Laterality: N/A;   COLONOSCOPY     ERCP N/A 09/17/2016   Procedure: ENDOSCOPIC RETROGRADE CHOLANGIOPANCREATOGRAPHY (ERCP);  Surgeon: [name], MD;  Location: [facility];  Service: Endoscopy;  Laterality: N/A;  730   ERCP N/A  12/03/2016   Procedure: ENDOSCOPIC RETROGRADE CHOLANGIOPANCREATOGRAPHY (ERCP);  Surgeon: [name], MD;  Location: [facility];  Service: Gastroenterology;  Laterality: N/A;   ESOPHAGOGASTRODUODENOSCOPY     GASTROINTESTINAL STENT REMOVAL N/A 12/03/2016   Procedure: BILIARY STENT REMOVAL;  Surgeon: [name], MD;  [facility];  Service: Gastroenterology;  Laterality: N/A;   NO PAST SURGERIES     SPHINCTEROTOMY N/A 09/17/2016   Procedure: SPHINCTEROTOMY;  Surgeon: [name], MD;  [facility];  Service: Endoscopy;  Laterality: N/A;    Family History  Problem Relation Age of Onset   Hyperlipidemia Father        in a nursing home   Colon cancer Father    Stroke Mother 33   Social History:  reports that he has never smoked. He has never used smokeless tobacco. He reports that he does not drink alcohol and does not use drugs.  Allergies: Not on File  Medications Prior to Admission  Medication Sig Dispense Refill   alfuzosin (UROXATRAL) 10 MG 24 hr tablet Take 1 tablet (10 mg total) by mouth daily. (Patient taking differently: Take 10 mg by mouth in the morning and at bedtime.) 30 tablet 2   Ascorbic Acid (VITAMIN C) 500 MG CAPS Take 500 mg by mouth daily.      aspirin EC 81 MG tablet Take 1 tablet (81 mg total) by mouth daily.     atorvastatin (LIPITOR) 40 MG tablet Take 1 tablet (40  mg total) by mouth at bedtime. 90 tablet 1   b complex vitamins tablet Take 1 tablet by mouth daily.      cholecalciferol (VITAMIN D3) 25 MCG (1000 UNIT) tablet Take 2,000 Units by mouth daily.     CINNAMON PO Take 2,000 mg by mouth daily.     Coenzyme Q10 (COQ10) 200 MG CAPS Take 200 mg by mouth daily.     empagliflozin (JARDIANCE) 25 MG TABS tablet Take 1 tablet (25 mg total) by mouth daily before breakfast. 30 tablet 2   FLUoxetine (PROZAC) 40 MG capsule Take 1 capsule (40 mg total) by mouth every morning. 90 capsule 1   Garlic 629 MG CAPS Take 500 mg by mouth daily.      insulin degludec (TRESIBA FLEXTOUCH) 100 UNIT/ML FlexTouch Pen Inject 50 Units into the skin daily. (Patient taking differently: Inject 55 Units into the skin daily.) 15 mL 0   lamoTRIgine (LAMICTAL) 150 MG tablet Take 1 tablet (150 mg total) by mouth 2 (two) times daily. 180 tablet 1   LORazepam (ATIVAN) 1 MG tablet TAKE 1 TABLET BY MOUTH THREE TIMES DAILY AS NEEDED FOR ANXIETY. GENERIC EQUIVALENT FOR ATIVAN. 270 tablet 1   Menthol-Methyl Salicylate (ARTHRITIS HOT) 10-15 % CREA Apply 1 application topically daily as needed (pain).     Menthol-Methyl Salicylate (ICY HOT) 52-84 % STCK Apply 1 application topically 3 (three) times daily as needed (pain).     metFORMIN (GLUCOPHAGE) 1000 MG tablet TAKE 1 TABLET BY MOUTH 2 TIMES DAILY WITH MEALS 180 tablet 1   metoprolol tartrate (LOPRESSOR) 25 MG tablet TAKE 1 TABLET BY MOUTH 2 TIMES DAILY. GENERIC EQUIVALENT FOR LOPRESSOR. 180 tablet 3   Multiple Vitamin (MULTIVITAMIN WITH MINERALS) TABS tablet Take 1 tablet by mouth daily.     Omega-3 Fatty Acids (FISH OIL) 1000 MG CAPS Take 2,000 mg by mouth daily.     omeprazole (PRILOSEC) 40 MG capsule Take 1 capsule (40 mg total) by mouth daily. 90 capsule 3   polyethylene glycol-electrolytes (TRILYTE) 420 g solution Take 4,000 mLs by mouth as directed. 4000 mL 0   polyethylene glycol-electrolytes (TRILYTE) 420 g solution Take 4,000 mLs by mouth as directed. 4000 mL 0   Semaglutide (RYBELSUS) 14 MG TABS Take 14 mg by mouth daily at 6 (six) AM. 30 tablet 0   valsartan (DIOVAN) 320 MG tablet Take 1 tablet (320 mg total) by mouth daily. 90 tablet 3   glucose blood test strip Use to check blood sugars daily 3 times daily  E11.65 200 each 3   Insulin Pen Needle (PEN NEEDLES) 32G X 4 MM MISC Use as directed 35 each 0    Results for orders placed or performed during the hospital encounter of 02/03/21 (from the past 48 hour(s))  Glucose, capillary     Status: None   Collection Time: 02/03/21  6:49 AM  Result  Value Ref Range   Glucose-Capillary 87 70 - 99 mg/dL    Comment: Glucose reference range applies only to samples taken after fasting for at least 8 hours.   No results found.  Review of Systems  Constitutional: Negative.   HENT:  Positive for trouble swallowing.   Eyes: Negative.   Respiratory: Negative.    Cardiovascular: Negative.   Gastrointestinal: Negative.   Endocrine: Negative.   Genitourinary: Negative.   Musculoskeletal: Negative.   Skin: Negative.   Allergic/Immunologic: Negative.   Neurological: Negative.   Hematological: Negative.   Psychiatric/Behavioral: Negative.     Blood  pressure 136/89, pulse 95, temperature 98.2 F (36.8 C), temperature source Oral, resp. rate 18, SpO2 99 %. Physical Exam  GENERAL: The patient is AO x3, in no acute distress. HEENT: Head is normocephalic and atraumatic. EOMI are intact. Mouth is well hydrated and without lesions. NECK: Supple. No masses LUNGS: Clear to auscultation. No presence of rhonchi/wheezing/rales. Adequate chest expansion HEART: RRR, normal s1 and s2. ABDOMEN: Soft, nontender, no guarding, no peritoneal signs, and nondistended. BS +. No masses. EXTREMITIES: Without any cyanosis, clubbing, rash, lesions or edema. NEUROLOGIC: AOx3, no focal motor deficit. SKIN: no jaundice, no rashes  Assessment/Plan GODRIC LAVELL is a 65 y.o. male with past medical history of GERD,  NASH, bipolar disorder, BPH, CVA, hyperlipidemia, hypertension, diabetes and TIA, who presents for evaluation of dysphagia and follow-up of colon polyps.  We will proceed to EGD with esophageal dilation and colonoscopy.  Harvel Quale, MD 02/03/2021, 7:29 AM

## 2021-02-03 NOTE — Op Note (Signed)
[facility] Patient Name: Robert Walters Procedure Date: 02/03/2021 7:51 AM MRN: [id number] Date of Birth: 08-26-1955 Attending MD: Maylon Peppers ,  CSN: [id number] Age: 65 Admit Type: Outpatient Procedure:                Flexible Sigmoidoscopy Indications:              High risk colon cancer surveillance: Personal                            history of colonic polyps Providers:                Maylon Peppers, Hinton Rao, RN, Aram Candela Referring MD:              Medicines:                Monitored Anesthesia Care Complications:            No immediate complications. Estimated Blood Loss:     Estimated blood loss: none. Procedure:                Pre-Anesthesia Assessment:                           - Prior to the procedure, a History and Physical                            was performed, and patient medications, allergies                            and sensitivities were reviewed. The patient's                            tolerance of previous anesthesia was reviewed.                           - The risks and benefits of the procedure and the                            sedation options and risks were discussed with the                            patient. All questions were answered and informed                            consent was obtained.                           After obtaining informed consent, the scope was                            passed under direct vision. The PCF-HQ190L                            ([id number]) scope was introduced through the anus and                            advanced to  the 60 cm from the anal verge. After                            obtaining informed consent, the scope was passed                            under direct vision.The flexible sigmoidoscopy was                            performed with difficulty due to inadequate bowel                            prep. The quality of the bowel preparation was poor. Scope In: 7:54:53 AM Scope Out:  8:09:02 AM Total Procedure Duration: 0 hours 14 minutes 9 seconds  Findings:      The perianal and digital rectal examinations were normal.      Extensive amounts of semi-liquid stool was found in the rectum, in the       sigmoid colon, in the descending colon and in the transverse colon,       precluding visualization. Impression:               - Preparation of the colon was poor.                           - Stool in the rectum, in the sigmoid colon, in the                            descending colon and in the transverse colon.                           - No specimens collected. Moderate Sedation:      Per Anesthesia Care Recommendation:           - Discharge patient to home (ambulatory).                           - Clear liquid diet.                           - Repeat full colonoscopyat the next available                            appointment because the bowel preparation was poor. Procedure Code(s):        --- Professional ---                           [phone], Sigmoidoscopy, flexible; diagnostic,                            including collection of specimen(s) by brushing or                            washing, when performed (separate procedure) Diagnosis Code(s):        --- Professional ---  Z86.010, Personal history of colonic polyps CPT copyright 2019 [employer]. All rights reserved. The codes documented in this report are preliminary and upon coder review may  be revised to meet current compliance requirements. Maylon Peppers, MD Maylon Peppers,  02/03/2021 8:19:51 AM This report has been signed electronically. Number of Addenda: 0

## 2021-02-03 NOTE — Anesthesia Postprocedure Evaluation (Signed)
Anesthesia Post Note  Patient: Robert Walters  Procedure(s) Performed: COLONOSCOPY WITH PROPOFOL ESOPHAGOGASTRODUODENOSCOPY (EGD) WITH PROPOFOL ESOPHAGEAL DILATION  Patient location during evaluation: Phase II Anesthesia Type: General Level of consciousness: awake Pain management: pain level controlled Vital Signs Assessment: post-procedure vital signs reviewed and stable Respiratory status: spontaneous breathing and respiratory function stable Cardiovascular status: blood pressure returned to baseline and stable Postop Assessment: no headache and no apparent nausea or vomiting Anesthetic complications: no Comments: Late entry   No notable events documented.   Last Vitals:  Vitals:   02/03/21 0705 02/03/21 0816  BP: 136/89 104/77  Pulse: 95 88  Resp: 18 18  Temp: 36.8 C   SpO2: 99% 100%    Last Pain:  Vitals:   02/03/21 0816  TempSrc: Oral  PainSc: 0-No pain                 Louann Sjogren

## 2021-02-03 NOTE — Discharge Instructions (Signed)
You are being discharged to home.  Clear liquid diet if proceeding with colonoscopy tomorrow. Continue your present medications.  Your physician has recommended a repeat full colonoscopy at the next available appointment because the bowel preparation was poor.

## 2021-02-03 NOTE — H&P (Signed)
Robert Walters is an 66 y.o. male.   Chief Complaint: dysphagia and history of colon polyps HPI: Robert Walters is a 65 y.o. male with past medical history of GERD,  NASH, bipolar disorder, BPH, CVA, hyperlipidemia, hypertension, diabetes and TIA, who presents for evaluation of dysphagia and follow-up of colon polyps.  Patient reports intermittent episodes of dysphagia usually when he eats hamburgers.  Denies any heartburn or odynophagia.  No episodes of food impaction.  Last Endoscopy: 2019-moderate diffuse gastritis. Last Colonoscopy: 01/2018-family history of colon cancer in father with personal history of colon polyps, multiple polyps, 6 mm polyp rectum, 3 polyps mid sigmoid, 10 mm polyp mid ascending, sessile polyp cecum, polyp ascending x2.  Path with tubular adenomas, 3-year repeat recommended.  Past Medical History:  Diagnosis Date   Acid reflux disease    Bipolar 1 disorder, mixed (HCC)    BPH (benign prostatic hypertrophy)    CVA (cerebral vascular accident) ([place]) 07/2015   Gastritis    H/O hiatal hernia    Hyperlipidemia    Hypertension    Normal nuclear stress test Dec 2011   Obesity    TIA (transient ischemic attack) 05/15/2012   "they think I've had one this am @ 0130" (05/15/2012)   Type II diabetes mellitus ([place])    Vertigo     Past Surgical History:  Procedure Laterality Date   BILIARY STENT PLACEMENT N/A 09/17/2016   Procedure: BILIARY STENT PLACEMENT;  Surgeon: [name], MD;  Location: [facility];  Service: Endoscopy;  Laterality: N/A;   CARDIOVASCULAR STRESS TEST  2009   EF 63%   CHOLECYSTECTOMY N/A 08/25/2016   Procedure: LAPAROSCOPIC CHOLECYSTECTOMY;  Surgeon: [name], MD;  Location: [facility];  Service: General;  Laterality: N/A;   COLONOSCOPY     ERCP N/A 09/17/2016   Procedure: ENDOSCOPIC RETROGRADE CHOLANGIOPANCREATOGRAPHY (ERCP);  Surgeon: [name], MD;  Location: [facility];  Service: Endoscopy;  Laterality: N/A;  730   ERCP N/A  12/03/2016   Procedure: ENDOSCOPIC RETROGRADE CHOLANGIOPANCREATOGRAPHY (ERCP);  Surgeon: [name], MD;  Location: [facility];  Service: Gastroenterology;  Laterality: N/A;   ESOPHAGOGASTRODUODENOSCOPY     GASTROINTESTINAL STENT REMOVAL N/A 12/03/2016   Procedure: BILIARY STENT REMOVAL;  Surgeon: [name], MD;  Location: [facility];  Service: Gastroenterology;  Laterality: N/A;   NO PAST SURGERIES     SPHINCTEROTOMY N/A 09/17/2016   Procedure: SPHINCTEROTOMY;  Surgeon: [name], MD;  Location: [facility];  Service: Endoscopy;  Laterality: N/A;    Family History  Problem Relation Age of Onset   Hyperlipidemia Father        in a nursing home   Colon cancer Father    Stroke Mother 48   Social History:  reports that he has never smoked. He has never used smokeless tobacco. He reports that he does not drink alcohol and does not use drugs.  Allergies: Not on File  Medications Prior to Admission  Medication Sig Dispense Refill   alfuzosin (UROXATRAL) 10 MG 24 hr tablet Take 1 tablet (10 mg total) by mouth daily. (Patient taking differently: Take 10 mg by mouth in the morning and at bedtime.) 30 tablet 2   Ascorbic Acid (VITAMIN C) 500 MG CAPS Take 500 mg by mouth daily.      aspirin EC 81 MG tablet Take 1 tablet (81 mg total) by mouth daily.     atorvastatin (LIPITOR) 40 MG tablet Take 1 tablet (40  mg total) by mouth at bedtime. 90 tablet 1   b complex vitamins tablet Take 1 tablet by mouth daily.      cholecalciferol (VITAMIN D3) 25 MCG (1000 UNIT) tablet Take 2,000 Units by mouth daily.     CINNAMON PO Take 2,000 mg by mouth daily.     Coenzyme Q10 (COQ10) 200 MG CAPS Take 200 mg by mouth daily.     empagliflozin (JARDIANCE) 25 MG TABS tablet Take 1 tablet (25 mg total) by mouth daily before breakfast. 30 tablet 2   FLUoxetine (PROZAC) 40 MG capsule Take 1 capsule (40 mg total) by mouth every morning. 90 capsule 1   Garlic 664 MG CAPS Take 500 mg by mouth daily.      insulin degludec (TRESIBA FLEXTOUCH) 100 UNIT/ML FlexTouch Pen Inject 50 Units into the skin daily. (Patient taking differently: Inject 55 Units into the skin daily.) 15 mL 0   lamoTRIgine (LAMICTAL) 150 MG tablet Take 1 tablet (150 mg total) by mouth 2 (two) times daily. 180 tablet 1   LORazepam (ATIVAN) 1 MG tablet TAKE 1 TABLET BY MOUTH THREE TIMES DAILY AS NEEDED FOR ANXIETY. GENERIC EQUIVALENT FOR ATIVAN. 270 tablet 1   Menthol-Methyl Salicylate (ARTHRITIS HOT) 10-15 % CREA Apply 1 application topically daily as needed (pain).     Menthol-Methyl Salicylate (ICY HOT) 40-34 % STCK Apply 1 application topically 3 (three) times daily as needed (pain).     metFORMIN (GLUCOPHAGE) 1000 MG tablet TAKE 1 TABLET BY MOUTH 2 TIMES DAILY WITH MEALS 180 tablet 1   metoprolol tartrate (LOPRESSOR) 25 MG tablet TAKE 1 TABLET BY MOUTH 2 TIMES DAILY. GENERIC EQUIVALENT FOR LOPRESSOR. 180 tablet 3   Multiple Vitamin (MULTIVITAMIN WITH MINERALS) TABS tablet Take 1 tablet by mouth daily.     Omega-3 Fatty Acids (FISH OIL) 1000 MG CAPS Take 2,000 mg by mouth daily.     omeprazole (PRILOSEC) 40 MG capsule Take 1 capsule (40 mg total) by mouth daily. 90 capsule 3   polyethylene glycol-electrolytes (TRILYTE) 420 g solution Take 4,000 mLs by mouth as directed. 4000 mL 0   polyethylene glycol-electrolytes (TRILYTE) 420 g solution Take 4,000 mLs by mouth as directed. 4000 mL 0   Semaglutide (RYBELSUS) 14 MG TABS Take 14 mg by mouth daily at 6 (six) AM. 30 tablet 0   valsartan (DIOVAN) 320 MG tablet Take 1 tablet (320 mg total) by mouth daily. 90 tablet 3   glucose blood test strip Use to check blood sugars daily 3 times daily  E11.65 200 each 3   Insulin Pen Needle (PEN NEEDLES) 32G X 4 MM MISC Use as directed 35 each 0    Results for orders placed or performed during the hospital encounter of 02/03/21 (from the past 48 hour(s))  Glucose, capillary     Status: None   Collection Time: 02/03/21  6:49 AM  Result  Value Ref Range   Glucose-Capillary 87 70 - 99 mg/dL    Comment: Glucose reference range applies only to samples taken after fasting for at least 8 hours.   No results found.  Review of Systems  Constitutional: Negative.   HENT:  Positive for trouble swallowing.   Eyes: Negative.   Respiratory: Negative.    Cardiovascular: Negative.   Gastrointestinal: Negative.   Endocrine: Negative.   Genitourinary: Negative.   Musculoskeletal: Negative.   Skin: Negative.   Allergic/Immunologic: Negative.   Neurological: Negative.   Hematological: Negative.   Psychiatric/Behavioral: Negative.     Blood  pressure 136/89, pulse 95, temperature 98.2 F (36.8 C), temperature source Oral, resp. rate 18, SpO2 99 %. Physical Exam  GENERAL: The patient is AO x3, in no acute distress. HEENT: Head is normocephalic and atraumatic. EOMI are intact. Mouth is well hydrated and without lesions. NECK: Supple. No masses LUNGS: Clear to auscultation. No presence of rhonchi/wheezing/rales. Adequate chest expansion HEART: RRR, normal s1 and s2. ABDOMEN: Soft, nontender, no guarding, no peritoneal signs, and nondistended. BS +. No masses. EXTREMITIES: Without any cyanosis, clubbing, rash, lesions or edema. NEUROLOGIC: AOx3, no focal motor deficit. SKIN: no jaundice, no rashes  Assessment/Plan Robert Walters is a 65 y.o. male with past medical history of GERD,  NASH, bipolar disorder, BPH, CVA, hyperlipidemia, hypertension, diabetes and TIA, who presents for evaluation of dysphagia and follow-up of colon polyps.  We will proceed to EGD with esophageal dilation and colonoscopy.  Harvel Quale, MD 02/03/2021, 7:29 AM

## 2021-02-03 NOTE — Telephone Encounter (Signed)
Robert Walters, CMA  

## 2021-02-03 NOTE — Transfer of Care (Signed)
Immediate Anesthesia Transfer of Care Note  Patient: DEAKON FRIX  Procedure(s) Performed: COLONOSCOPY WITH PROPOFOL ESOPHAGOGASTRODUODENOSCOPY (EGD) WITH PROPOFOL ESOPHAGEAL DILATION  Patient Location: Short Stay  Anesthesia Type:General  Level of Consciousness: awake and patient cooperative  Airway & Oxygen Therapy: Patient Spontanous Breathing and Patient connected to nasal cannula oxygen  Post-op Assessment: Report given to RN and Post -op Vital signs reviewed and stable  Post vital signs: Reviewed and stable  Last Vitals:  Vitals Value Taken Time  BP    Temp    Pulse    Resp    SpO2    SEE VITAL SIGN FLOW SHEET  Last Pain:  Vitals:   02/03/21 0705  TempSrc: Oral  PainSc: 0-No pain      Patients Stated Pain Goal: 5 (11/73/56 7014)  Complications: No notable events documented.

## 2021-02-04 ENCOUNTER — Other Ambulatory Visit: Payer: Self-pay

## 2021-02-04 ENCOUNTER — Encounter (HOSPITAL_COMMUNITY): Payer: Self-pay | Admitting: Gastroenterology

## 2021-02-04 ENCOUNTER — Ambulatory Visit (HOSPITAL_COMMUNITY)
Admission: RE | Admit: 2021-02-04 | Discharge: 2021-02-04 | Disposition: A | Discharge status: Home / Self Care | Payer: Medicare Other | Attending: Gastroenterology | Admitting: Gastroenterology

## 2021-02-04 ENCOUNTER — Encounter (HOSPITAL_COMMUNITY)
Admission: RE | Disposition: A | Discharge status: Home / Self Care | Payer: Self-pay | Source: Home / Self Care | Attending: Gastroenterology

## 2021-02-04 ENCOUNTER — Ambulatory Visit (HOSPITAL_COMMUNITY): Payer: Medicare Other | Admitting: Anesthesiology

## 2021-02-04 DIAGNOSIS — K649 Unspecified hemorrhoids: Secondary | ICD-10-CM | POA: Diagnosis not present

## 2021-02-04 DIAGNOSIS — D125 Benign neoplasm of sigmoid colon: Secondary | ICD-10-CM

## 2021-02-04 DIAGNOSIS — Z79899 Other long term (current) drug therapy: Secondary | ICD-10-CM | POA: Insufficient documentation

## 2021-02-04 DIAGNOSIS — E669 Obesity, unspecified: Secondary | ICD-10-CM | POA: Insufficient documentation

## 2021-02-04 DIAGNOSIS — Z800 Family history of malignant neoplasm of digestive organs: Secondary | ICD-10-CM | POA: Insufficient documentation

## 2021-02-04 DIAGNOSIS — E119 Type 2 diabetes mellitus without complications: Secondary | ICD-10-CM | POA: Diagnosis not present

## 2021-02-04 DIAGNOSIS — D123 Benign neoplasm of transverse colon: Secondary | ICD-10-CM | POA: Insufficient documentation

## 2021-02-04 DIAGNOSIS — I10 Essential (primary) hypertension: Secondary | ICD-10-CM | POA: Insufficient documentation

## 2021-02-04 DIAGNOSIS — K219 Gastro-esophageal reflux disease without esophagitis: Secondary | ICD-10-CM | POA: Insufficient documentation

## 2021-02-04 DIAGNOSIS — D120 Benign neoplasm of cecum: Secondary | ICD-10-CM | POA: Insufficient documentation

## 2021-02-04 DIAGNOSIS — Z6833 Body mass index (BMI) 33.0-33.9, adult: Secondary | ICD-10-CM | POA: Insufficient documentation

## 2021-02-04 DIAGNOSIS — Z86010 Personal history of colonic polyps: Secondary | ICD-10-CM | POA: Diagnosis not present

## 2021-02-04 DIAGNOSIS — N400 Enlarged prostate without lower urinary tract symptoms: Secondary | ICD-10-CM | POA: Insufficient documentation

## 2021-02-04 DIAGNOSIS — Z7984 Long term (current) use of oral hypoglycemic drugs: Secondary | ICD-10-CM | POA: Insufficient documentation

## 2021-02-04 DIAGNOSIS — Z794 Long term (current) use of insulin: Secondary | ICD-10-CM | POA: Diagnosis not present

## 2021-02-04 DIAGNOSIS — Z09 Encounter for follow-up examination after completed treatment for conditions other than malignant neoplasm: Secondary | ICD-10-CM | POA: Diagnosis not present

## 2021-02-04 DIAGNOSIS — E785 Hyperlipidemia, unspecified: Secondary | ICD-10-CM | POA: Insufficient documentation

## 2021-02-04 DIAGNOSIS — Z7982 Long term (current) use of aspirin: Secondary | ICD-10-CM | POA: Insufficient documentation

## 2021-02-04 DIAGNOSIS — Z1211 Encounter for screening for malignant neoplasm of colon: Secondary | ICD-10-CM | POA: Diagnosis not present

## 2021-02-04 DIAGNOSIS — K635 Polyp of colon: Secondary | ICD-10-CM | POA: Diagnosis not present

## 2021-02-04 DIAGNOSIS — D122 Benign neoplasm of ascending colon: Secondary | ICD-10-CM | POA: Diagnosis not present

## 2021-02-04 HISTORY — PX: COLONOSCOPY WITH PROPOFOL: SHX5780

## 2021-02-04 HISTORY — PX: POLYPECTOMY: SHX149

## 2021-02-04 LAB — HM COLONOSCOPY

## 2021-02-04 LAB — GLUCOSE, CAPILLARY: Glucose-Capillary: 104 mg/dL — ABNORMAL HIGH (ref 70–99)

## 2021-02-04 SURGERY — COLONOSCOPY WITH PROPOFOL
Anesthesia: General

## 2021-02-04 MED ORDER — PROPOFOL 500 MG/50ML IV EMUL
INTRAVENOUS | Status: DC | PRN
  Administered 2021-02-04: 150 ug/kg/min via INTRAVENOUS

## 2021-02-04 MED ORDER — STERILE WATER FOR IRRIGATION IR SOLN
Status: DC | PRN
  Administered 2021-02-04: 300 mL

## 2021-02-04 MED ORDER — LACTATED RINGERS IV SOLN: INTRAVENOUS | Status: DC

## 2021-02-04 MED ORDER — PROPOFOL 10 MG/ML IV BOLUS
INTRAVENOUS | Status: DC | PRN
  Administered 2021-02-04: 50 mg via INTRAVENOUS
  Administered 2021-02-04: 100 mg via INTRAVENOUS
  Administered 2021-02-04 (×2): 30 mg via INTRAVENOUS

## 2021-02-04 MED ORDER — PHENYLEPHRINE HCL (PRESSORS) 10 MG/ML IV SOLN
INTRAVENOUS | Status: DC | PRN
  Administered 2021-02-04: 80 ug via INTRAVENOUS

## 2021-02-04 NOTE — Anesthesia Preprocedure Evaluation (Signed)
Anesthesia Evaluation  Patient identified by MRN, date of birth, ID band Patient awake    Reviewed: Allergy & Precautions, H&P , NPO status , Patient's Chart, lab work & pertinent test results, reviewed documented beta blocker date and time   Airway Mallampati: II  TM Distance: >3 FB Neck ROM: full    Dental no notable dental hx.    Pulmonary neg pulmonary ROS,    Pulmonary exam normal breath sounds clear to auscultation       Cardiovascular Exercise Tolerance: Good hypertension, negative cardio ROS   Rhythm:regular Rate:Normal     Neuro/Psych PSYCHIATRIC DISORDERS Anxiety Bipolar Disorder TIACVA, No Residual Symptoms    GI/Hepatic hiatal hernia, GERD  Medicated,(+) Hepatitis -, Autoimmune  Endo/Other  negative endocrine ROSdiabetes  Renal/GU negative Renal ROS  negative genitourinary   Musculoskeletal   Abdominal   Peds  Hematology negative hematology ROS (+)   Anesthesia Other Findings   Reproductive/Obstetrics negative OB ROS                             Anesthesia Physical  Anesthesia Plan  ASA: 3  Anesthesia Plan: General   Post-op Pain Management:    Induction:   PONV Risk Score and Plan: Propofol infusion  Airway Management Planned:   Additional Equipment:   Intra-op Plan:   Post-operative Plan:   Informed Consent: I have reviewed the patients History and Physical, chart, labs and discussed the procedure including the risks, benefits and alternatives for the proposed anesthesia with the patient or authorized representative who has indicated his/her understanding and acceptance.     Dental Advisory Given  Plan Discussed with: CRNA  Anesthesia Plan Comments:         Anesthesia Quick Evaluation

## 2021-02-04 NOTE — Interval H&P Note (Signed)
History and Physical Interval Note:  02/04/2021 12:22 PM  Robert Walters  has presented today for surgery, with the diagnosis of Hx of colon polyps.  The various methods of treatment have been discussed with the patient and family. After consideration of risks, benefits and other options for treatment, the patient has consented to  Procedure(s) with comments: COLONOSCOPY WITH PROPOFOL (N/A) - 1:45 as a surgical intervention.  The patient's history has been reviewed, patient examined, no change in status, stable for surgery.  I have reviewed the patient's chart and labs.  Questions were answered to the patient's satisfaction.     [name]

## 2021-02-04 NOTE — Transfer of Care (Signed)
Immediate Anesthesia Transfer of Care Note  Patient: Robert Walters  Procedure(s) Performed: COLONOSCOPY WITH PROPOFOL POLYPECTOMY INTESTINAL  Patient Location: Endoscopy Unit  Anesthesia Type:General  Level of Consciousness: awake  Airway & Oxygen Therapy: Patient Spontanous Breathing  Post-op Assessment: Report given to RN  Post vital signs: Reviewed  Last Vitals:  Vitals Value Taken Time  BP    Temp 36.8 C 02/04/21 1508  Pulse    Resp 20 02/04/21 1508  SpO2 96 % 02/04/21 1508    Last Pain:  Vitals:   02/04/21 1508  TempSrc: Oral  PainSc: 0-No pain      Patients Stated Pain Goal: 5 (91/98/02 2179)  Complications: No notable events documented.

## 2021-02-04 NOTE — Anesthesia Postprocedure Evaluation (Signed)
Anesthesia Post Note  Patient: Robert Walters  Procedure(s) Performed: COLONOSCOPY WITH PROPOFOL POLYPECTOMY INTESTINAL  Patient location during evaluation: PACU Anesthesia Type: General Level of consciousness: awake and alert Pain management: pain level controlled Vital Signs Assessment: post-procedure vital signs reviewed and stable Respiratory status: spontaneous breathing Cardiovascular status: blood pressure returned to baseline and stable Postop Assessment: no apparent nausea or vomiting Anesthetic complications: no   No notable events documented.   Last Vitals:  Vitals:   02/04/21 1227 02/04/21 1508  BP: (!) 149/89   Pulse: 100   Resp: 16 20  Temp: 36.7 C 36.8 C  SpO2: 97% 96%    Last Pain:  Vitals:   02/04/21 1508  TempSrc: Oral  PainSc: 0-No pain                 Berna Gitto

## 2021-02-04 NOTE — Discharge Instructions (Signed)
You are being discharged to home.  Resume your previous diet.  We are waiting for your pathology results.  Your physician has recommended a repeat colonoscopy in one year for surveillance due to bowel prep quality. Will need a two day prep and should take Miralax 1 capful daily 7 days prior to his colonoscopy.

## 2021-02-04 NOTE — Op Note (Signed)
[facility] Patient Name: Robert Walters Procedure Date: 02/04/2021 2:09 PM MRN: [id number] Date of Birth: 18-Dec-1955 Attending MD: Maylon Peppers ,  CSN: [id number] Age: 65 Admit Type: Outpatient Procedure:                Colonoscopy Indications:              High risk colon cancer surveillance: Personal                            history of colonic polyps Providers:                Maylon Peppers, Crystal Page, Raphael Gibney,                            Technician Referring MD:              Medicines:                Monitored Anesthesia Care Complications:            No immediate complications. Estimated Blood Loss:     Estimated blood loss: none. Procedure:                Pre-Anesthesia Assessment:                           - Prior to the procedure, a History and Physical                            was performed, and patient medications, allergies                            and sensitivities were reviewed. The patient's                            tolerance of previous anesthesia was reviewed.                           - The risks and benefits of the procedure and the                            sedation options and risks were discussed with the                            patient. All questions were answered and informed                            consent was obtained.                           - ASA Grade Assessment: II - A patient with mild                            systemic disease.                           After obtaining informed consent, the colonoscope  was passed under direct vision. Throughout the                            procedure, the patient's blood pressure, pulse, and                            oxygen saturations were monitored continuously. The                            PCF-HQ190L ([id number] scope was introduced through                            the anus and advanced to the the terminal ileum.                            The  colonoscopy was performed without difficulty.                            The patient tolerated the procedure well. The                            quality of the bowel preparation was fair. Scope In: 2:20:24 PM Scope Out: 2:59:55 PM Scope Withdrawal Time: 0 hours 27 minutes 55 seconds  Total Procedure Duration: 0 hours 39 minutes 31 seconds  Findings:      Hemorrhoids were found on perianal exam.      The terminal ileum appeared normal.      Seven sessile, non-bleeding polyps were found in the sigmoid colon,       transverse colon, ascending colon and cecum. The polyps were 3 to 8 mm       in size. These polyps were removed with a cold snare. Resection and       retrieval were complete.      The retroflexed view of the distal rectum and anal verge was normal and       showed no anal or rectal abnormalities. Impression:               - Preparation of the colon was fair.                           - Hemorrhoids found on perianal exam.                           - The examined portion of the ileum was normal.                           - Seven 3 to 8 mm, non-bleeding polyps in the                            sigmoid colon, in the transverse colon, in the                            ascending colon and in the cecum, removed with a  cold snare. Resected and retrieved.                           - The distal rectum and anal verge are normal on                            retroflexion view. Moderate Sedation:      Per Anesthesia Care Recommendation:           - Discharge patient to home (ambulatory).                           - Resume previous diet.                           - Await pathology results.                           - Repeat colonoscopy in 1 year for surveillance due                            to bowel prep quality. Will need a two day prep and                            should take Miralax 1 capful daily 7 days prior to                            his  colonoscopy. Procedure Code(s):        --- Professional ---                           [phone], Colonoscopy, flexible; with removal of                            tumor(s), polyp(s), or other lesion(s) by snare                            technique Diagnosis Code(s):        --- Professional ---                           Z86.010, Personal history of colonic polyps                           K64.9, Unspecified hemorrhoids                           K63.5, Polyp of colon CPT copyright 2019 American Medical Association. All rights reserved. The codes documented in this report are preliminary and upon coder review may  be revised to meet current compliance requirements. Maylon Peppers, MD Maylon Peppers,  02/04/2021 3:06:07 PM This report has been signed electronically. Number of Addenda: 0

## 2021-02-06 LAB — SURGICAL PATHOLOGY

## 2021-02-10 ENCOUNTER — Encounter (HOSPITAL_COMMUNITY): Payer: Self-pay | Admitting: Gastroenterology

## 2021-02-11 ENCOUNTER — Encounter (INDEPENDENT_AMBULATORY_CARE_PROVIDER_SITE_OTHER): Payer: Self-pay | Admitting: *Deleted

## 2021-02-19 ENCOUNTER — Ambulatory Visit (INDEPENDENT_AMBULATORY_CARE_PROVIDER_SITE_OTHER): Payer: Medicare Other

## 2021-02-19 ENCOUNTER — Telehealth: Payer: Medicare Other

## 2021-02-19 DIAGNOSIS — N1831 Chronic kidney disease, stage 3a: Secondary | ICD-10-CM

## 2021-02-19 DIAGNOSIS — E782 Mixed hyperlipidemia: Secondary | ICD-10-CM

## 2021-02-19 DIAGNOSIS — E119 Type 2 diabetes mellitus without complications: Secondary | ICD-10-CM

## 2021-02-19 DIAGNOSIS — I10 Essential (primary) hypertension: Secondary | ICD-10-CM

## 2021-02-19 NOTE — Chronic Care Management (AMB) (Signed)
Chronic Care Management   CCM RN Visit Note  02/19/2021 Name: Robert Walters MRN: [id number] DOB: 15-Oct-1955  Subjective: Robert Walters is a 65 y.o. year old male who is a primary care patient of [name], [place]. The care management team was consulted for assistance with disease management and care coordination needs.    Collaboration with [name] embedded Pharm D  for  Case Collaboration   in response to provider referral for case management and/or care coordination services.   Consent to Services:  The patient was given information about Chronic Care Management services, agreed to services, and gave verbal consent prior to initiation of services.  Please see initial visit note for detailed documentation.   Patient agreed to services and verbal consent obtained.   Assessment: Review of patient past medical history, allergies, medications, health status, including review of consultants reports, laboratory and other test data, was performed as part of comprehensive evaluation and provision of chronic care management services.   SDOH (Social Determinants of Health) assessments and interventions performed:    CCM Care Plan  No Known Allergies  Outpatient Encounter Medications as of 02/19/2021  Medication Sig Note   alfuzosin (UROXATRAL) 10 MG 24 hr tablet Take 1 tablet (10 mg total) by mouth daily. (Patient taking differently: Take 10 mg by mouth in the morning and at bedtime.)    Ascorbic Acid (VITAMIN C) 500 MG CAPS Take 500 mg by mouth daily.     aspirin EC 81 MG tablet Take 1 tablet (81 mg total) by mouth daily.    atorvastatin (LIPITOR) 40 MG tablet Take 1 tablet (40 mg total) by mouth at bedtime.    b complex vitamins tablet Take 1 tablet by mouth daily.     cholecalciferol (VITAMIN D3) 25 MCG (1000 UNIT) tablet Take 2,000 Units by mouth daily.    CINNAMON PO Take 2,000 mg by mouth daily.    Coenzyme Q10 (COQ10) 200 MG CAPS Take 200 mg by mouth daily.    empagliflozin  (JARDIANCE) 25 MG TABS tablet Take 1 tablet (25 mg total) by mouth daily before breakfast.    FLUoxetine (PROZAC) 40 MG capsule Take 1 capsule (40 mg total) by mouth every morning.    Garlic 623 MG CAPS Take 500 mg by mouth daily.    glucose blood test strip Use to check blood sugars daily 3 times daily  E11.65    insulin degludec (TRESIBA FLEXTOUCH) 100 UNIT/ML FlexTouch Pen Inject 50 Units into the skin daily. (Patient taking differently: Inject 55 Units into the skin daily.) 02/03/2021: Took 27 units last night   Insulin Pen Needle (PEN NEEDLES) 32G X 4 MM MISC Use as directed    lamoTRIgine (LAMICTAL) 150 MG tablet Take 1 tablet (150 mg total) by mouth 2 (two) times daily.    LORazepam (ATIVAN) 1 MG tablet TAKE 1 TABLET BY MOUTH THREE TIMES DAILY AS NEEDED FOR ANXIETY. GENERIC EQUIVALENT FOR ATIVAN.    Menthol-Methyl Salicylate (ARTHRITIS HOT) 10-15 % CREA Apply 1 application topically daily as needed (pain).    Menthol-Methyl Salicylate (ICY HOT) 76-28 % STCK Apply 1 application topically 3 (three) times daily as needed (pain).    metFORMIN (GLUCOPHAGE) 1000 MG tablet TAKE 1 TABLET BY MOUTH 2 TIMES DAILY WITH MEALS    metoprolol tartrate (LOPRESSOR) 25 MG tablet TAKE 1 TABLET BY MOUTH 2 TIMES DAILY. GENERIC EQUIVALENT FOR LOPRESSOR.    Multiple Vitamin (MULTIVITAMIN WITH MINERALS) TABS tablet Take 1 tablet by mouth daily.  Omega-3 Fatty Acids (FISH OIL) 1000 MG CAPS Take 2,000 mg by mouth daily.    omeprazole (PRILOSEC) 40 MG capsule Take 1 capsule (40 mg total) by mouth daily.    Semaglutide (RYBELSUS) 14 MG TABS Take 14 mg by mouth daily at 6 (six) AM.    valsartan (DIOVAN) 320 MG tablet Take 1 tablet (320 mg total) by mouth daily.    No facility-administered encounter medications on file as of 02/19/2021.    Patient Active Problem List   Diagnosis Date Noted   NASH (nonalcoholic steatohepatitis) 11/20/2020   Dysphagia 11/20/2020   Chronic diastolic heart failure ([place]) 03/27/2019    Fatty liver 12/26/2018   Elevated LFTs 12/26/2018   History of colonic polyps 12/26/2018   Fall 08/28/2018   Rib pain on left side 08/28/2018   Anxiety 03/31/2018   Bipolar II disorder ([place]) 03/31/2018   Insomnia 03/31/2018   Type 2 diabetes mellitus without complication, without long-term current use of insulin ([place]) 03/24/2018   Bile leak 09/15/2016   Sepsis ([place]) 08/23/2016   Cholecystitis    Stroke ([place]) 07/25/2015   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 07/25/2015   Dizziness 08/09/2014   TIA (transient ischemic attack) 05/15/2012   Blurred vision 05/15/2012   Ataxia 05/15/2012   Diabetes mellitus type 2 with complications ([place]) 42/87/6811   BPH (benign prostatic hyperplasia) 05/15/2012   GERD (gastroesophageal reflux disease) 05/15/2012   Obesity 05/15/2012   Bipolar 1 disorder ([place]) 05/15/2012   Essential hypertension 10/30/2010   Cardiovascular risk factor 10/30/2010   Pure hypercholesterolemia 12/05/2008    Conditions to be addressed/monitored: DMII, Essential Hypertension, Stage 3a CKD, Mixed Hyperlipidemia   Care Plan : Diabetes Type 2 (Adult)  Updates made by Lynne Logan, RN since 02/19/2021 12:00 AM     Problem: Glycemic Management (Diabetes, Type 2)   Priority: Medium     Long-Range Goal: Glycemic Management Optimized   Start Date: 07/01/2020  Expected End Date: 07/01/2021  Recent Progress: On track  Priority: Medium  Note:   CARE PLAN ENTRY (see longitudinal plan of care for additional care plan information) Objective:  Lab Results  Component Value Date   HGBA1C 6.7 (H) 12/31/2020   Lab Results  Component Value Date   CREATININE 1.50 (H) 12/31/2020   CREATININE 1.56 (H) 09/30/2020   CREATININE 1.50 (H) 06/30/2020   Lab Results  Component Value Date   EGFR 51 (L) 12/31/2020  Current Barriers:  Knowledge Deficits related to disease process and Self Health management of DM  Chronic Disease Management support and education needs related to DMII, Essential  Hypertension, Stage 3a CKD, Mixed Hyperlipidemia  Nurse Case Manager Clinical Goal(s):  Patient will work with CCM RN CM and PCP to address needs related to disease education and support to improve Self Health management of DM as evidence by patient will lower and maintain A1c <7.0 CCM RN CM Interventions:  01/02/21 completed successful outbound call with patient  Inter-disciplinary care team collaboration (see longitudinal plan of care) Evaluation of current treatment plan related to DM and patient's adherence to plan as established by provider. Reviewed current A1c has decreased to 6.7; Educated on basic disease process including complications if uncontrolled; Educated on dietary and exercise recommendations, Provided recommendations for monitoring CBG twice daily before meals and at bedtime and logging BS; Educated on daily glycemic control FBS 80-130; <180 after meals; Educated on importance of implementing exercise to daily routine (150 min weekly), maintaining healthy weight and adhering to diabetic diet: Determined patient  is now checking his CBG's at home 1-2 times daily and is logging his readings; Educated on 15'15' rule    Reviewed medications with patient and discussed indication, dosage and frequency of prescribed medications for diabetes Mailed printed educational materials related to Low Carb Smoothies; Mediterranean diet; Grocery Shopping with Diabetes  Discussed plans with patient for ongoing care management follow up and provided patient with direct contact information for care management team 02/19/21 Inbound call from patient  Received voice message from patient stating he would like to let [place] embedded Pharm D know that he is getting low on his Rybelsus supply Determined patient received his Jardiance from the manufacturer and would like a call back from [place] message to [name], embedded Pharm D advising the patient is getting low on his Rybelsus and would  like for her to give him a call Reply received from [facility] acknowledging the update  Patient Self Care Activities:  Self administer medications as prescribed  Attend all scheduled provider appointments Call pharmacy for medication refills Call provider office for new concerns or questions Patient Goals:  Continue to monitor CBG's at home  Continue to adhere to dietary and exercise recommendations  Increase water to 64 oz daily   Follow Up Plan: Telephone follow up appointment with care management team member scheduled for: 03/10/21     Plan:Telephone follow up appointment with care management team member scheduled for:  03/10/21  Barb Merino, RN, BSN, CCM Care Management Coordinator [place] [facility]  Direct Phone: [phone]

## 2021-02-20 ENCOUNTER — Telehealth: Payer: Self-pay

## 2021-02-20 NOTE — Chronic Care Management (AMB) (Signed)
Chronic Care Management Pharmacy Assistant   Name: Robert Walters  MRN: [id number] DOB: 06-Jan-1956   Reason for Encounter: Patient assistance shipment     Medications: Outpatient Encounter Medications as of 02/20/2021  Medication Sig Note   alfuzosin (UROXATRAL) 10 MG 24 hr tablet Take 1 tablet (10 mg total) by mouth daily. (Patient taking differently: Take 10 mg by mouth in the morning and at bedtime.)    Ascorbic Acid (VITAMIN C) 500 MG CAPS Take 500 mg by mouth daily.     aspirin EC 81 MG tablet Take 1 tablet (81 mg total) by mouth daily.    atorvastatin (LIPITOR) 40 MG tablet Take 1 tablet (40 mg total) by mouth at bedtime.    b complex vitamins tablet Take 1 tablet by mouth daily.     cholecalciferol (VITAMIN D3) 25 MCG (1000 UNIT) tablet Take 2,000 Units by mouth daily.    CINNAMON PO Take 2,000 mg by mouth daily.    Coenzyme Q10 (COQ10) 200 MG CAPS Take 200 mg by mouth daily.    empagliflozin (JARDIANCE) 25 MG TABS tablet Take 1 tablet (25 mg total) by mouth daily before breakfast.    FLUoxetine (PROZAC) 40 MG capsule Take 1 capsule (40 mg total) by mouth every morning.    Garlic 409 MG CAPS Take 500 mg by mouth daily.    glucose blood test strip Use to check blood sugars daily 3 times daily  E11.65    insulin degludec (TRESIBA FLEXTOUCH) 100 UNIT/ML FlexTouch Pen Inject 50 Units into the skin daily. (Patient taking differently: Inject 55 Units into the skin daily.) 02/03/2021: Took 27 units last night   Insulin Pen Needle (PEN NEEDLES) 32G X 4 MM MISC Use as directed    lamoTRIgine (LAMICTAL) 150 MG tablet Take 1 tablet (150 mg total) by mouth 2 (two) times daily.    LORazepam (ATIVAN) 1 MG tablet TAKE 1 TABLET BY MOUTH THREE TIMES DAILY AS NEEDED FOR ANXIETY. GENERIC EQUIVALENT FOR ATIVAN.    Menthol-Methyl Salicylate (ARTHRITIS HOT) 10-15 % CREA Apply 1 application topically daily as needed (pain).    Menthol-Methyl Salicylate (ICY HOT) 81-19 % STCK Apply 1 application  topically 3 (three) times daily as needed (pain).    metFORMIN (GLUCOPHAGE) 1000 MG tablet TAKE 1 TABLET BY MOUTH 2 TIMES DAILY WITH MEALS    metoprolol tartrate (LOPRESSOR) 25 MG tablet TAKE 1 TABLET BY MOUTH 2 TIMES DAILY. GENERIC EQUIVALENT FOR LOPRESSOR.    Multiple Vitamin (MULTIVITAMIN WITH MINERALS) TABS tablet Take 1 tablet by mouth daily.    Omega-3 Fatty Acids (FISH OIL) 1000 MG CAPS Take 2,000 mg by mouth daily.    omeprazole (PRILOSEC) 40 MG capsule Take 1 capsule (40 mg total) by mouth daily.    Semaglutide (RYBELSUS) 14 MG TABS Take 14 mg by mouth daily at 6 (six) AM.    valsartan (DIOVAN) 320 MG tablet Take 1 tablet (320 mg total) by mouth daily.    No facility-administered encounter medications on file as of 02/20/2021.   02-20-2021: Patient stated he is down to his last Rybelsus 14 mg. Contacted [facility] to get shipment status and was informed that shipment is in process with no tracking. Informed patient I will check with the office for samples Monday and give him a call.  Care Gaps: Shingrix overdue PNA Vac overdue Medicare wellness 04-01-2021 Yearly ophthalmology exam overdue Flu vaccine overdue Covid booster overdue  Star Rating Drugs: Atorvastatin 40 mg- Last filled 01-12-2021 90 DS  [facility] Rybelsus 14 mg- Patient assistance Valsartan 320 mg- Last filled 11-11-2020 90 DS [facility] Metformin 1000 mg- Last filled 09-24-2020 90 DS [facility] (Patient states he has plenty on hand) Jardiance 25 mg- Patient assistance  [place] Pharmacist Assistant [phone]

## 2021-02-23 ENCOUNTER — Other Ambulatory Visit: Payer: Self-pay

## 2021-02-23 MED ORDER — ATORVASTATIN CALCIUM 80 MG PO TABS: 40.0000 mg | ORAL_TABLET | Freq: Every day | ORAL | 1 refills | Status: DC

## 2021-03-02 DIAGNOSIS — N1831 Chronic kidney disease, stage 3a: Secondary | ICD-10-CM

## 2021-03-02 DIAGNOSIS — I10 Essential (primary) hypertension: Secondary | ICD-10-CM | POA: Diagnosis not present

## 2021-03-02 DIAGNOSIS — E782 Mixed hyperlipidemia: Secondary | ICD-10-CM | POA: Diagnosis not present

## 2021-03-02 DIAGNOSIS — E119 Type 2 diabetes mellitus without complications: Secondary | ICD-10-CM | POA: Diagnosis not present

## 2021-03-10 ENCOUNTER — Ambulatory Visit (INDEPENDENT_AMBULATORY_CARE_PROVIDER_SITE_OTHER): Payer: Medicare Other

## 2021-03-10 ENCOUNTER — Telehealth: Payer: Medicare Other

## 2021-03-10 DIAGNOSIS — E119 Type 2 diabetes mellitus without complications: Secondary | ICD-10-CM

## 2021-03-10 DIAGNOSIS — N1831 Chronic kidney disease, stage 3a: Secondary | ICD-10-CM

## 2021-03-10 DIAGNOSIS — I10 Essential (primary) hypertension: Secondary | ICD-10-CM

## 2021-03-10 DIAGNOSIS — E782 Mixed hyperlipidemia: Secondary | ICD-10-CM

## 2021-03-11 NOTE — Chronic Care Management (AMB) (Signed)
Chronic Care Management   CCM RN Visit Note  03/10/2021 Name: Robert Walters MRN: [id number] DOB: [birth year]  Subjective: Robert Walters is a 65 y.o. year old male who is a primary care patient of [name], [place]. The care management team was consulted for assistance with disease management and care coordination needs.    Engaged with patient by telephone for follow up visit in response to provider referral for case management and/or care coordination services.   Consent to Services:  The patient was given information about Chronic Care Management services, agreed to services, and gave verbal consent prior to initiation of services.  Please see initial visit note for detailed documentation.   Patient agreed to services and verbal consent obtained.   Assessment: Review of patient past medical history, allergies, medications, health status, including review of consultants reports, laboratory and other test data, was performed as part of comprehensive evaluation and provision of chronic care management services.   SDOH (Social Determinants of Health) assessments and interventions performed:  Yes, no acute challenges   CCM Care Plan  No Known Allergies  Outpatient Encounter Medications as of 03/10/2021  Medication Sig Note   alfuzosin (UROXATRAL) 10 MG 24 hr tablet Take 1 tablet (10 mg total) by mouth daily. (Patient taking differently: Take 10 mg by mouth in the morning and at bedtime.)    Ascorbic Acid (VITAMIN C) 500 MG CAPS Take 500 mg by mouth daily.     aspirin EC 81 MG tablet Take 1 tablet (81 mg total) by mouth daily.    atorvastatin (LIPITOR) 80 MG tablet Take 0.5 tablets (40 mg total) by mouth at bedtime. (Patient taking differently: Take 80 mg by mouth at bedtime.)    b complex vitamins tablet Take 1 tablet by mouth daily.     cholecalciferol (VITAMIN D3) 25 MCG (1000 UNIT) tablet Take 2,000 Units by mouth daily.    CINNAMON PO Take 2,000 mg by mouth daily.    Coenzyme Q10  (COQ10) 200 MG CAPS Take 200 mg by mouth daily.    empagliflozin (JARDIANCE) 25 MG TABS tablet Take 1 tablet (25 mg total) by mouth daily before breakfast.    FLUoxetine (PROZAC) 40 MG capsule Take 1 capsule (40 mg total) by mouth every morning.    Garlic 428 MG CAPS Take 500 mg by mouth daily.    glucose blood test strip Use to check blood sugars daily 3 times daily  E11.65    insulin degludec (TRESIBA FLEXTOUCH) 100 UNIT/ML FlexTouch Pen Inject 50 Units into the skin daily. (Patient taking differently: Inject 55 Units into the skin daily.) 02/03/2021: Took 27 units last night   Insulin Pen Needle (PEN NEEDLES) 32G X 4 MM MISC Use as directed    lamoTRIgine (LAMICTAL) 150 MG tablet Take 1 tablet (150 mg total) by mouth 2 (two) times daily.    LORazepam (ATIVAN) 1 MG tablet TAKE 1 TABLET BY MOUTH THREE TIMES DAILY AS NEEDED FOR ANXIETY. GENERIC EQUIVALENT FOR ATIVAN.    Menthol-Methyl Salicylate (ARTHRITIS HOT) 10-15 % CREA Apply 1 application topically daily as needed (pain).    Menthol-Methyl Salicylate (ICY HOT) 76-81 % STCK Apply 1 application topically 3 (three) times daily as needed (pain).    metFORMIN (GLUCOPHAGE) 1000 MG tablet TAKE 1 TABLET BY MOUTH 2 TIMES DAILY WITH MEALS    metoprolol tartrate (LOPRESSOR) 25 MG tablet TAKE 1 TABLET BY MOUTH 2 TIMES DAILY. GENERIC EQUIVALENT FOR LOPRESSOR.    Multiple Vitamin (MULTIVITAMIN WITH MINERALS)  TABS tablet Take 1 tablet by mouth daily.    Omega-3 Fatty Acids (FISH OIL) 1000 MG CAPS Take 2,000 mg by mouth daily.    omeprazole (PRILOSEC) 40 MG capsule Take 1 capsule (40 mg total) by mouth daily.    Semaglutide (RYBELSUS) 14 MG TABS Take 14 mg by mouth daily at 6 (six) AM.    valsartan (DIOVAN) 320 MG tablet Take 1 tablet (320 mg total) by mouth daily.    No facility-administered encounter medications on file as of 03/10/2021.    Patient Active Problem List   Diagnosis Date Noted   NASH (nonalcoholic steatohepatitis) 11/20/2020   Dysphagia  11/20/2020   Chronic diastolic heart failure ([place]) 03/27/2019   Fatty liver 12/26/2018   Elevated LFTs 12/26/2018   History of colonic polyps 12/26/2018   Fall 08/28/2018   Rib pain on left side 08/28/2018   Anxiety 03/31/2018   Bipolar II disorder ([place]) 03/31/2018   Insomnia 03/31/2018   Type 2 diabetes mellitus without complication, without long-term current use of insulin ([place]) 03/24/2018   Bile leak 09/15/2016   Sepsis ([place]) 08/23/2016   Cholecystitis    Stroke ([place]) 07/25/2015   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 07/25/2015   Dizziness 08/09/2014   TIA (transient ischemic attack) 05/15/2012   Blurred vision 05/15/2012   Ataxia 05/15/2012   Diabetes mellitus type 2 with complications ([place]) 67/59/1638   BPH (benign prostatic hyperplasia) 05/15/2012   GERD (gastroesophageal reflux disease) 05/15/2012   Obesity 05/15/2012   Bipolar 1 disorder ([place]) 05/15/2012   Essential hypertension 10/30/2010   Cardiovascular risk factor 10/30/2010   Pure hypercholesterolemia 12/05/2008    Conditions to be addressed/monitored: DMII, Essential Hypertension, Stage 3a CKD, Mixed Hyperlipidemia   Care Plan : RN Care Manager Plan of Care  Updates made by Lynne Logan, RN since 03/10/2021 12:00 AM     Problem: No plan established for management of chronic disease states (DMII, Essential Hypertension, Stage 3a CKD, Mixed Hyperlipidemia)   Priority: High     Long-Range Goal: Development of plan of care for chronic disease management DMII, Essential Hypertension, Stage 3a CKD, Mixed Hyperlipidemia   Start Date: 03/10/2021  Expected End Date: 03/10/2022  This Visit's Progress: On track  Priority: High  Note:   Current Barriers:  Knowledge Deficits related to plan of care for management of DMII, Essential Hypertension, Stage 3a CKD, Mixed Hyperlipidemia  Chronic Disease Management support and education needs related to DMII, Essential Hypertension, Stage 3a CKD, Mixed Hyperlipidemia   RNCM Clinical  Goal(s):  Patient will verbalize basic understanding of  DMII, Essential Hypertension, Stage 3a CKD, Mixed Hyperlipidemia  disease process and self health management plan   take all medications exactly as prescribed and will call provider for medication related questions demonstrate Improved health management independence   continue to work with RN Care Manager to address care management and care coordination needs related to  DMII, Essential Hypertension, Stage 3a CKD, Mixed Hyperlipidemia  will demonstrate ongoing self health care management ability    through collaboration with RN Care manager, provider, and care team.   Interventions: 1:1 collaboration with primary care provider regarding development and update of comprehensive plan of care as evidenced by provider attestation and co-signature Inter-disciplinary care team collaboration (see longitudinal plan of care) Evaluation of current treatment plan related to  self management and patient's adherence to plan as established by provider  Hyperlipidemia Interventions: Medication review performed; medication list updated in electronic medical record.  Provider established cholesterol goals reviewed;  Counseled on importance of regular laboratory monitoring as prescribed; Provided HLD educational materials; Reviewed role and benefits of statin for ASCVD risk reduction; Reviewed importance of limiting foods high in cholesterol; Reviewed exercise goals and target of 150 minutes per week;  Diabetes Interventions: Assessed patient's understanding of A1c goal: <6.5% Provided education to patient about basic DM disease process; Reviewed medications with patient and discussed importance of medication adherence; Counseled on importance of regular laboratory monitoring as prescribed; Discussed plans with patient for ongoing care management follow up and provided patient with direct contact information for care management team; Provided patient  with written educational materials related to hypo and hyperglycemia and importance of correct treatment; Reviewed scheduled/upcoming provider appointments including: next PCP follow up ; Advised patient, providing education and rationale, to check cbg daily before meals and at bedtime and record, calling PCP and or RN CM for findings outside established parameters; Review of patient status, including review of consultants reports, relevant laboratory and other test results, and medications completed; Lab Results  Component Value Date   HGBA1C 6.7 (H) 12/31/2020   Chronic Kidney Disease Interventions:  (Status:  Goal on track:  Yes Evaluation of current treatment plan related to chronic kidney disease self management and patient's adherence to plan as established by provider      Reviewed prescribed diet increase water intake as directed Counseled on the importance of exercise goals with target of 150 minutes per week     Advised patient, providing education and rationale, to monitor blood pressure daily and record, calling PCP for findings outside established parameters    Discussed plans with patient for ongoing care management follow up and provided patient with direct contact information for care management team    Provided education on kidney disease progression    Engage patient in early, proactive and ongoing discussion about goals of care and what matters most to them    Last practice recorded BP readings:  BP Readings from Last 3 Encounters:  02/04/21 (!) 100/55  02/03/21 104/77  01/30/21 (!) 123/50  Most recent eGFR/CrCl:  Lab Results  Component Value Date   EGFR 51 (L) 12/31/2020    No components found for: CRCL  Patient Goals/Self-Care Activities: Patient will self administer medications as prescribed Patient will attend all scheduled provider appointments Patient will call pharmacy for medication refills Patient will attend church or other social activities Patient will  continue to perform ADL's independently Patient will continue to perform IADL's independently Patient will call provider office for new concerns or questions  Follow Up Plan:  Telephone follow up appointment with care management team member scheduled for:  04/03/21     Plan:Telephone follow up appointment with care management team member scheduled for:  04/03/21  Barb Merino, RN, BSN, CCM Care Management Coordinator [place] [facility]  Direct Phone: [phone]

## 2021-03-11 NOTE — Patient Instructions (Signed)
Visit Information   PATIENT GOALS/PLAN OF CARE:   Care Plan : RN Care Manager Plan of Care  Updates made by Lynne Logan, RN since 03/10/2021 12:00 AM     Problem: No plan established for management of chronic disease states (DMII, Essential Hypertension, Stage 3a CKD, Mixed Hyperlipidemia)   Priority: High     Long-Range Goal: Development of plan of care for chronic disease management DMII, Essential Hypertension, Stage 3a CKD, Mixed Hyperlipidemia   Start Date: 03/10/2021  Expected End Date: 03/10/2022  This Visit's Progress: On track  Priority: High  Note:   Current Barriers:  Knowledge Deficits related to plan of care for management of DMII, Essential Hypertension, Stage 3a CKD, Mixed Hyperlipidemia  Chronic Disease Management support and education needs related to DMII, Essential Hypertension, Stage 3a CKD, Mixed Hyperlipidemia   RNCM Clinical Goal(s):  Patient will verbalize basic understanding of  DMII, Essential Hypertension, Stage 3a CKD, Mixed Hyperlipidemia  disease process and self health management plan   take all medications exactly as prescribed and will call provider for medication related questions demonstrate Improved health management independence   continue to work with RN Care Manager to address care management and care coordination needs related to  DMII, Essential Hypertension, Stage 3a CKD, Mixed Hyperlipidemia  will demonstrate ongoing self health care management ability    through collaboration with RN Care manager, provider, and care team.   Interventions: 1:1 collaboration with primary care provider regarding development and update of comprehensive plan of care as evidenced by provider attestation and co-signature Inter-disciplinary care team collaboration (see longitudinal plan of care) Evaluation of current treatment plan related to  self management and patient's adherence to plan as established by provider  Hyperlipidemia Interventions: Medication  review performed; medication list updated in electronic medical record.  Provider established cholesterol goals reviewed; Counseled on importance of regular laboratory monitoring as prescribed; Provided HLD educational materials; Reviewed role and benefits of statin for ASCVD risk reduction; Reviewed importance of limiting foods high in cholesterol; Reviewed exercise goals and target of 150 minutes per week;  Diabetes Interventions: Assessed patient's understanding of A1c goal: <6.5% Provided education to patient about basic DM disease process; Reviewed medications with patient and discussed importance of medication adherence; Counseled on importance of regular laboratory monitoring as prescribed; Discussed plans with patient for ongoing care management follow up and provided patient with direct contact information for care management team; Provided patient with written educational materials related to hypo and hyperglycemia and importance of correct treatment; Reviewed scheduled/upcoming provider appointments including: next PCP follow up ; Advised patient, providing education and rationale, to check cbg daily before meals and at bedtime and record, calling PCP and or RN CM for findings outside established parameters; Review of patient status, including review of consultants reports, relevant laboratory and other test results, and medications completed; Lab Results  Component Value Date   HGBA1C 6.7 (H) 12/31/2020   Chronic Kidney Disease Interventions:  (Status:  Goal on track:  Yes Evaluation of current treatment plan related to chronic kidney disease self management and patient's adherence to plan as established by provider      Reviewed prescribed diet increase water intake as directed Counseled on the importance of exercise goals with target of 150 minutes per week     Advised patient, providing education and rationale, to monitor blood pressure daily and record, calling PCP for  findings outside established parameters    Discussed plans with patient for ongoing care management follow up and  provided patient with direct contact information for care management team    Provided education on kidney disease progression    Engage patient in early, proactive and ongoing discussion about goals of care and what matters most to them    Last practice recorded BP readings:  BP Readings from Last 3 Encounters:  02/04/21 (!) 100/55  02/03/21 104/77  01/30/21 (!) 123/50  Most recent eGFR/CrCl:  Lab Results  Component Value Date   EGFR 51 (L) 12/31/2020    No components found for: CRCL  Patient Goals/Self-Care Activities: Patient will self administer medications as prescribed Patient will attend all scheduled provider appointments Patient will call pharmacy for medication refills Patient will attend church or other social activities Patient will continue to perform ADL's independently Patient will continue to perform IADL's independently Patient will call provider office for new concerns or questions  Follow Up Plan:  Telephone follow up appointment with care management team member scheduled for:  04/03/21      Consent to CCM Services: Robert Walters was given information about Chronic Care Management services including:  CCM service includes personalized support from designated clinical staff supervised by his physician, including individualized plan of care and coordination with other care providers 24/7 contact phone numbers for assistance for urgent and routine care needs. Service will only be billed when office clinical staff spend 20 minutes or more in a month to coordinate care. Only one practitioner may furnish and bill the service in a calendar month. The patient may stop CCM services at any time (effective at the end of the month) by phone call to the office staff. The patient will be responsible for cost sharing (co-pay) of up to 20% of the service fee (after  annual deductible is met).  Patient agreed to services and verbal consent obtained.   The patient verbalized understanding of instructions, educational materials, and care plan provided today and declined offer to receive copy of patient instructions, educational materials, and care plan.   Telephone follow up appointment with care management team member scheduled for: 04/03/21  Barb Merino, RN, BSN, CCM Care Management Coordinator [facility]  Direct Phone: [phone]

## 2021-03-20 ENCOUNTER — Telehealth: Payer: Self-pay

## 2021-03-20 NOTE — Chronic Care Management (AMB) (Addendum)
Chronic Care Management Pharmacy Assistant   Name: Robert Walters  MRN: [id number] DOB: 1955/07/21   Reason for Encounter: PAP   Medications: Outpatient Encounter Medications as of 03/20/2021  Medication Sig Note   alfuzosin (UROXATRAL) 10 MG 24 hr tablet Take 1 tablet (10 mg total) by mouth daily. (Patient taking differently: Take 10 mg by mouth in the morning and at bedtime.)    Ascorbic Acid (VITAMIN C) 500 MG CAPS Take 500 mg by mouth daily.     aspirin EC 81 MG tablet Take 1 tablet (81 mg total) by mouth daily.    atorvastatin (LIPITOR) 80 MG tablet Take 0.5 tablets (40 mg total) by mouth at bedtime. (Patient taking differently: Take 80 mg by mouth at bedtime.)    b complex vitamins tablet Take 1 tablet by mouth daily.     cholecalciferol (VITAMIN D3) 25 MCG (1000 UNIT) tablet Take 2,000 Units by mouth daily.    CINNAMON PO Take 2,000 mg by mouth daily.    Coenzyme Q10 (COQ10) 200 MG CAPS Take 200 mg by mouth daily.    empagliflozin (JARDIANCE) 25 MG TABS tablet Take 1 tablet (25 mg total) by mouth daily before breakfast.    FLUoxetine (PROZAC) 40 MG capsule Take 1 capsule (40 mg total) by mouth every morning.    Garlic 650 MG CAPS Take 500 mg by mouth daily.    glucose blood test strip Use to check blood sugars daily 3 times daily  E11.65    insulin degludec (TRESIBA FLEXTOUCH) 100 UNIT/ML FlexTouch Pen Inject 50 Units into the skin daily. (Patient taking differently: Inject 55 Units into the skin daily.) 02/03/2021: Took 27 units last night   Insulin Pen Needle (PEN NEEDLES) 32G X 4 MM MISC Use as directed    lamoTRIgine (LAMICTAL) 150 MG tablet Take 1 tablet (150 mg total) by mouth 2 (two) times daily.    LORazepam (ATIVAN) 1 MG tablet TAKE 1 TABLET BY MOUTH THREE TIMES DAILY AS NEEDED FOR ANXIETY. GENERIC EQUIVALENT FOR ATIVAN.    Menthol-Methyl Salicylate (ARTHRITIS HOT) 10-15 % CREA Apply 1 application topically daily as needed (pain).    Menthol-Methyl Salicylate (ICY HOT)  35-46 % STCK Apply 1 application topically 3 (three) times daily as needed (pain).    metFORMIN (GLUCOPHAGE) 1000 MG tablet TAKE 1 TABLET BY MOUTH 2 TIMES DAILY WITH MEALS    metoprolol tartrate (LOPRESSOR) 25 MG tablet TAKE 1 TABLET BY MOUTH 2 TIMES DAILY. GENERIC EQUIVALENT FOR LOPRESSOR.    Multiple Vitamin (MULTIVITAMIN WITH MINERALS) TABS tablet Take 1 tablet by mouth daily.    Omega-3 Fatty Acids (FISH OIL) 1000 MG CAPS Take 2,000 mg by mouth daily.    omeprazole (PRILOSEC) 40 MG capsule Take 1 capsule (40 mg total) by mouth daily.    Semaglutide (RYBELSUS) 14 MG TABS Take 14 mg by mouth daily at 6 (six) AM.    valsartan (DIOVAN) 320 MG tablet Take 1 tablet (320 mg total) by mouth daily.    No facility-administered encounter medications on file as of 03/20/2021.   03-20-2021: Patient called to follow up on Rybelsus 14 mg from [employer] patient assistance. Was able to retrieve a tracking number for medication [phone] from automated system. Tried to track medication on [employer] but it's saying tracking number is invalid. Requested a call back today from [employer].  03-23-2021: Spoke with [employer] representative and was informed Rybelsus 14 mg was returned due to being delivered to the office on Friday 03-13-2021.  Since the office is closed on Friday the representative stated a letter will have to be faxed stating the reason medication was returned. I then explained that it states No deliveries Friday-Sunday, patient has been out of this medication for a few weeks and writing a letter means patient will have to wait an extra 14 business days. The representative then got the approval to process delivery again without a letter and was provided a 30 day free voucher. I then thanked the representative then requested a refill to be sent to patient's pharmacy. Informed patient that medication is being filled that will hold him until shipment arrives.  [place] Pharmacist  Assistant [phone]

## 2021-03-23 ENCOUNTER — Other Ambulatory Visit: Payer: Self-pay

## 2021-03-23 MED ORDER — RYBELSUS 14 MG PO TABS: 14.0000 mg | ORAL_TABLET | Freq: Every day | ORAL | 0 refills | Status: DC

## 2021-03-25 DIAGNOSIS — E782 Mixed hyperlipidemia: Secondary | ICD-10-CM | POA: Diagnosis not present

## 2021-03-26 LAB — LIPID PANEL
Chol/HDL Ratio: 5.5 ratio — ABNORMAL HIGH (ref 0.0–5.0)
Cholesterol, Total: 227 mg/dL — ABNORMAL HIGH (ref 100–199)
HDL: 41 mg/dL (ref >39)
LDL Chol Calc (NIH): 120 mg/dL — ABNORMAL HIGH (ref 0–99)
Triglycerides: 376 mg/dL — ABNORMAL HIGH (ref 0–149)
VLDL Cholesterol Cal: 66 mg/dL — ABNORMAL HIGH (ref 5–40)

## 2021-04-01 ENCOUNTER — Ambulatory Visit (HOSPITAL_BASED_OUTPATIENT_CLINIC_OR_DEPARTMENT_OTHER): Payer: Medicare Other | Admitting: Internal Medicine

## 2021-04-01 ENCOUNTER — Encounter: Payer: Medicare Other | Admitting: Nurse Practitioner

## 2021-04-01 ENCOUNTER — Encounter (HOSPITAL_BASED_OUTPATIENT_CLINIC_OR_DEPARTMENT_OTHER): Payer: Self-pay | Admitting: Internal Medicine

## 2021-04-01 ENCOUNTER — Ambulatory Visit: Payer: Medicare Other

## 2021-04-01 ENCOUNTER — Ambulatory Visit: Payer: Medicare Other | Admitting: Nurse Practitioner

## 2021-04-01 ENCOUNTER — Other Ambulatory Visit: Payer: Self-pay

## 2021-04-01 VITALS — BP 110/70 | HR 109 | Ht 69.0 in | Wt 238.0 lb

## 2021-04-01 DIAGNOSIS — N1831 Chronic kidney disease, stage 3a: Secondary | ICD-10-CM

## 2021-04-01 DIAGNOSIS — E1169 Type 2 diabetes mellitus with other specified complication: Secondary | ICD-10-CM

## 2021-04-01 DIAGNOSIS — Z794 Long term (current) use of insulin: Secondary | ICD-10-CM

## 2021-04-01 DIAGNOSIS — I129 Hypertensive chronic kidney disease with stage 1 through stage 4 chronic kidney disease, or unspecified chronic kidney disease: Secondary | ICD-10-CM

## 2021-04-01 DIAGNOSIS — E782 Mixed hyperlipidemia: Secondary | ICD-10-CM | POA: Diagnosis not present

## 2021-04-01 DIAGNOSIS — I5032 Chronic diastolic (congestive) heart failure: Secondary | ICD-10-CM

## 2021-04-01 DIAGNOSIS — E1122 Type 2 diabetes mellitus with diabetic chronic kidney disease: Secondary | ICD-10-CM

## 2021-04-01 DIAGNOSIS — Z7984 Long term (current) use of oral hypoglycemic drugs: Secondary | ICD-10-CM

## 2021-04-01 DIAGNOSIS — E669 Obesity, unspecified: Secondary | ICD-10-CM | POA: Diagnosis not present

## 2021-04-01 MED ORDER — EZETIMIBE 10 MG PO TABS: 10.0000 mg | ORAL_TABLET | Freq: Every day | ORAL | 3 refills | Status: DC

## 2021-04-01 NOTE — Patient Instructions (Signed)
Medication Instructions:  START ezetimibe 10mg  daily   *If you need a refill on your cardiac medications before your next appointment, please call your pharmacy*   Lab Work: FASTING lab work to check cholesterol in 3 months -- complete before your next visit with Dr. Debara Pickett   If you have labs (blood work) drawn today and your tests are completely normal, you will receive your results only by: [place] (if you have MyChart) OR A paper copy in the mail If you have any lab test that is abnormal or we need to change your treatment, we will call you to review the results.  Follow-Up: At [facility], you and your health needs are our priority.  As part of our continuing mission to provide you with exceptional heart care, we have created designated Provider Care Teams.  These Care Teams include your primary Cardiologist (physician) and Advanced Practice Providers (APPs -  Physician Assistants and Nurse Practitioners) who all work together to provide you with the care you need, when you need it.  We recommend signing up for the patient portal called "MyChart".  Sign up information is provided on this After Visit Summary.  MyChart is used to connect with patients for Virtual Visits (Telemedicine).  Patients are able to view lab/test results, encounter notes, upcoming appointments, etc.  Non-urgent messages can be sent to your provider as well.   To learn more about what you can do with MyChart, go to [email or web address].    Your next appointment:   3-4 month(s)  The format for your next appointment:   In Person  Provider:   Dr. Lyman Bishop -- [facility] office

## 2021-04-01 NOTE — Progress Notes (Signed)
LIPID CLINIC CONSULT NOTE  Chief Complaint:  Follow-up dyslipidemia  Primary Care Physician: [name], FNP  Primary Cardiologist:  Mertie Moores, MD  HPI:  Robert Walters is a 65 y.o. male who is being seen today for the evaluation of dyslipidemia at the request of [name], [place]. 65 y.o. male who presents via audio/video conferencing for a telehealth visit today.  This is a pleasant 65 year old male patient of [name] with a history of hypertension, dyslipidemia, chronic diastolic heart failure and type 2 diabetes with gastroparesis.  More recently has had significant rise in A1c which peaked over 10 demonstrating morning blood sugars of 400.  He was started on long-acting insulin in July and has noted significant improvement in his numbers.  His triglycerides have tracked this as well rising from 371 a year ago up to 511 4 months ago.  LDL has remained fairly stable at 110 however he is on 2 mg daily.  He was also recommended to use over-the-counter fish oil.  09/15/2020  Mr. Marchetta returns today in the office for follow-up of dyslipidemia.  He had repeat lipids in February which showed total cholesterol 191, triglycerides 375, HDL 35 and LDL 94.  This does represent somewhat of a reduction in his lipids although not dramatic.  The triglycerides are probably the lowest we have seen in a while.  A lot of this may have been related to his hemoglobin A1c which was over 10 at one point but is now down to 6.9 as of February.  Current treatment strategy includes atorvastatin 40 mg daily, and 1 g of fish oil daily.  04/01/2021  Mr. Camargo returns today for follow-up.  His cholesterol in the interim is gone up slightly.  Total cholesterol 222, triglycerides 376, HDL 41 and LDL 120, up from 91.  He reports compliance with his medicine.  He may have had some dietary changes.  He is working on his blood sugar.  He is on a good medical regimen.  Recently his primary care provider increased  his atorvastatin from 40 to 80 mg.  I am not sure that is enough to get him to target.  I like to see his LDL down below 70.  PMHx:  Past Medical History:  Diagnosis Date   Acid reflux disease    Bipolar 1 disorder, mixed (HCC)    BPH (benign prostatic hypertrophy)    CVA (cerebral vascular accident) ([place]) 07/2015   Gastritis    H/O hiatal hernia    Hyperlipidemia    Hypertension    Normal nuclear stress test Dec 2011   Obesity    TIA (transient ischemic attack) 05/15/2012   "they think I've had one this am @ 0130" (05/15/2012)   Type II diabetes mellitus ([place])    Vertigo     Past Surgical History:  Procedure Laterality Date   BILIARY STENT PLACEMENT N/A 09/17/2016   Procedure: BILIARY STENT PLACEMENT;  Surgeon: [name], MD;  [facility];  Service: Endoscopy;  Laterality: N/A;   CARDIOVASCULAR STRESS TEST  2009   EF 63%   CHOLECYSTECTOMY N/A 08/25/2016   Procedure: LAPAROSCOPIC CHOLECYSTECTOMY;  Surgeon: [name], MD;  Location: [facility] OR[facility];  Service: General;  Laterality: N/A;   COLONOSCOPY     COLONOSCOPY WITH PROPOFOL N/A 02/03/2021   Procedure: COLONOSCOPY WITH PROPOFOL;  Surgeon: Harvel Quale, MD;  Location[facility];  Service: Gastroenterology;  Laterality: N/A;    incomplete colonoscopy poor prep  COLONOSCOPY WITH PROPOFOL N/A 02/04/2021   Procedure: COLONOSCOPY WITH PROPOFOL;  Surgeon: Harvel Quale, MD;  [facility];  Service: Gastroenterology;  Laterality: N/A;  1:45   ERCP N/A 09/17/2016   Procedure: ENDOSCOPIC RETROGRADE CHOLANGIOPANCREATOGRAPHY (ERCP);  Surgeon: [name], MD;  [facility];  Service: Endoscopy;  Laterality: N/A;  730   ERCP N/A 12/03/2016   Procedure: ENDOSCOPIC RETROGRADE CHOLANGIOPANCREATOGRAPHY (ERCP);  Surgeon: [name], MD;  [facility];  Service: Gastroenterology;  Laterality: N/A;   ESOPHAGEAL DILATION  02/03/2021   Procedure: ESOPHAGEAL DILATION;   Surgeon: Montez Morita, Quillian Quince, MD;  [facility];  Service: Gastroenterology;;  savory 16 and 18 fr   ESOPHAGOGASTRODUODENOSCOPY     ESOPHAGOGASTRODUODENOSCOPY (EGD) WITH PROPOFOL N/A 02/03/2021   Procedure: ESOPHAGOGASTRODUODENOSCOPY (EGD) WITH PROPOFOL;  Surgeon: Harvel Quale, MD;  [facility];  Service: Gastroenterology;  Laterality: N/A;   GASTROINTESTINAL STENT REMOVAL N/A 12/03/2016   Procedure: BILIARY STENT REMOVAL;  Surgeon: [name], MD;  [facility];  Service: Gastroenterology;  Laterality: N/A;   NO PAST SURGERIES     POLYPECTOMY  02/04/2021   Procedure: POLYPECTOMY INTESTINAL;  Surgeon: Harvel Quale, MD;  [facility];  Service: Gastroenterology;;   SPHINCTEROTOMY N/A 09/17/2016   Procedure: Joan Mayans;  Surgeon: [name], MD;  [facility];  Service: Endoscopy;  Laterality: N/A;    FAMHx:  Family History  Problem Relation Age of Onset   Hyperlipidemia Father        in a nursing home   Colon cancer Father    Stroke Mother 68    SOCHx:   reports that he has never smoked. He has never used smokeless tobacco. He reports that he does not drink alcohol and does not use drugs.  ALLERGIES:  No Known Allergies  ROS: Pertinent items noted in HPI and remainder of comprehensive ROS otherwise negative.  HOME MEDS: Current Outpatient Medications on File Prior to Visit  Medication Sig Dispense Refill   alfuzosin (UROXATRAL) 10 MG 24 hr tablet Take 1 tablet (10 mg total) by mouth daily. (Patient taking differently: Take 10 mg by mouth in the morning and at bedtime.) 30 tablet 2   Ascorbic Acid (VITAMIN C) 500 MG CAPS Take 500 mg by mouth daily.      aspirin EC 81 MG tablet Take 1 tablet (81 mg total) by mouth daily.     atorvastatin (LIPITOR) 80 MG tablet Take 0.5 tablets (40 mg total) by mouth at bedtime. (Patient taking differently: Take 80 mg by mouth at bedtime.) 90 tablet 1    b complex vitamins tablet Take 1 tablet by mouth daily.      cholecalciferol (VITAMIN D3) 25 MCG (1000 UNIT) tablet Take 2,000 Units by mouth daily.     CINNAMON PO Take 2,000 mg by mouth daily.     Coenzyme Q10 (COQ10) 200 MG CAPS Take 200 mg by mouth daily.     empagliflozin (JARDIANCE) 25 MG TABS tablet Take 1 tablet (25 mg total) by mouth daily before breakfast. 30 tablet 2   FLUoxetine (PROZAC) 40 MG capsule Take 1 capsule (40 mg total) by mouth every morning. 90 capsule 1   Garlic 568 MG CAPS Take 500 mg by mouth daily.     glucose blood test strip Use to check blood sugars daily 3 times daily  E11.65 200 each 3   insulin degludec (TRESIBA FLEXTOUCH) 100 UNIT/ML FlexTouch  Pen Inject 50 Units into the skin daily. (Patient taking differently: Inject 55 Units into the skin daily.) 15 mL 0   Insulin Pen Needle (PEN NEEDLES) 32G X 4 MM MISC Use as directed 35 each 0   lamoTRIgine (LAMICTAL) 150 MG tablet Take 1 tablet (150 mg total) by mouth 2 (two) times daily. 180 tablet 1   LORazepam (ATIVAN) 1 MG tablet TAKE 1 TABLET BY MOUTH THREE TIMES DAILY AS NEEDED FOR ANXIETY. GENERIC EQUIVALENT FOR ATIVAN. 270 tablet 1   Menthol-Methyl Salicylate (ARTHRITIS HOT) 10-15 % CREA Apply 1 application topically daily as needed (pain).     Menthol-Methyl Salicylate (ICY HOT) 62-70 % STCK Apply 1 application topically 3 (three) times daily as needed (pain).     metFORMIN (GLUCOPHAGE) 1000 MG tablet TAKE 1 TABLET BY MOUTH 2 TIMES DAILY WITH MEALS 180 tablet 1   metoprolol tartrate (LOPRESSOR) 25 MG tablet TAKE 1 TABLET BY MOUTH 2 TIMES DAILY. GENERIC EQUIVALENT FOR LOPRESSOR. 180 tablet 3   Multiple Vitamin (MULTIVITAMIN WITH MINERALS) TABS tablet Take 1 tablet by mouth daily.     Omega-3 Fatty Acids (FISH OIL) 1000 MG CAPS Take 2,000 mg by mouth daily.     omeprazole (PRILOSEC) 40 MG capsule Take 1 capsule (40 mg total) by mouth daily. 90 capsule 3   Semaglutide (RYBELSUS) 14 MG TABS Take 14 mg by mouth  daily at 6 (six) AM. 90 tablet 0   valsartan (DIOVAN) 320 MG tablet Take 1 tablet (320 mg total) by mouth daily. 90 tablet 3   No current facility-administered medications on file prior to visit.    LABS/IMAGING: No results found for this or any previous visit (from the past 48 hour(s)). No results found.  LIPID PANEL:    Component Value Date/Time   CHOL 227 (H) 03/25/2021 1040   TRIG 376 (H) 03/25/2021 1040   HDL 41 03/25/2021 1040   CHOLHDL 5.5 (H) 03/25/2021 1040   CHOLHDL 7.1 07/26/2015 0647   VLDL UNABLE TO CALCULATE IF TRIGLYCERIDE OVER 400 mg/dL 07/26/2015 0647   LDLCALC 120 (H) 03/25/2021 1040   LDLDIRECT 124.3 11/10/2010 0953    WEIGHTS: Wt Readings from Last 3 Encounters:  04/01/21 238 lb (108 kg)  02/04/21 230 lb (104.3 kg)  01/30/21 230 lb (104.3 kg)    VITALS: BP 110/70   Pulse (!) 109   Ht 5\' 9"  (1.753 m)   Wt 238 lb (108 kg)   SpO2 96%   BMI 35.15 kg/m   EXAM: Deferred  EKG: deferred  ASSESSMENT: Hypertriglyceridemia Uncontrolled DM2 Chronic diastolic CHF Obesity  PLAN: 1.   Mr. Hoge high triglycerides and LDL above target.  I agree with the increase in his atorvastatin from 40 to 80 mg however this will likely give no more than about a 6% reduction in his cholesterol.  He needs more to reach target LDL less than 70.  Would advise adding ezetimibe 10 mg daily to his regimen.  We will plan repeat lipids in about 3 months and follow-up with me at that time.  He would like to follow-up with me in [place] if possible.  Pixie Casino, MD, [facility], [place] Director of the Advanced Lipid Disorders &  Cardiovascular Risk Reduction Clinic Diplomate of the American Board of Clinical Lipidology Attending Cardiologist  Direct Dial: [phone]  Fax: [phone]  Website:  [email or web address] 04/01/2021, 2:37 PM

## 2021-04-02 ENCOUNTER — Ambulatory Visit (INDEPENDENT_AMBULATORY_CARE_PROVIDER_SITE_OTHER): Payer: Medicare Other

## 2021-04-02 VITALS — Ht 69.0 in | Wt 238.0 lb

## 2021-04-02 DIAGNOSIS — Z0000 Encounter for general adult medical examination without abnormal findings: Secondary | ICD-10-CM | POA: Diagnosis not present

## 2021-04-02 NOTE — Progress Notes (Signed)
I connected with Robert Walters today by telephone and verified that I am speaking with the correct person using two identifiers. Location patient: home Location provider: work Persons participating in the virtual visit: Garv, Kuechle LPN.   I discussed the limitations, risks, security and privacy concerns of performing an evaluation and management service by telephone and the availability of in person appointments. I also discussed with the patient that there may be a patient responsible charge related to this service. The patient expressed understanding and verbally consented to this telephonic visit.    Interactive audio and video telecommunications were attempted between this provider and patient, however failed, due to patient having technical difficulties OR patient did not have access to video capability.  We continued and completed visit with audio only.     Vital signs may be patient reported or missing.  Subjective:   Robert Walters is a 65 y.o. male who presents for Medicare Annual/Subsequent preventive examination.  Review of Systems     Cardiac Risk Factors include: advanced age (>15men, >17 women);diabetes mellitus;hypertension;male gender;obesity (BMI >30kg/m2);sedentary lifestyle     Objective:    Today's Vitals   04/02/21 0857  Weight: 238 lb (108 kg)  Height: 5\' 9"  (1.753 m)   Body mass index is 35.15 kg/m.  Advanced Directives 04/02/2021 02/04/2021 02/03/2021 01/30/2021 03/26/2020 03/21/2019 12/03/2016  Does Patient Have a Medical Advance Directive? No No No No No No No  Would patient like information on creating a medical advance directive? - No - Patient declined - No - Patient declined No - Patient declined - No - Patient declined  Pre-existing out of facility DNR order (yellow form or pink MOST form) - - - - - - -    Current Medications (verified) Outpatient Encounter Medications as of 04/02/2021  Medication Sig   alfuzosin (UROXATRAL) 10 MG 24 hr  tablet Take 1 tablet (10 mg total) by mouth daily. (Patient taking differently: Take 10 mg by mouth in the morning and at bedtime.)   Ascorbic Acid (VITAMIN C) 500 MG CAPS Take 500 mg by mouth daily.    aspirin EC 81 MG tablet Take 1 tablet (81 mg total) by mouth daily.   atorvastatin (LIPITOR) 80 MG tablet Take 80 mg by mouth daily.   b complex vitamins tablet Take 1 tablet by mouth daily.    cholecalciferol (VITAMIN D3) 25 MCG (1000 UNIT) tablet Take 2,000 Units by mouth daily.   CINNAMON PO Take 2,000 mg by mouth daily.   Coenzyme Q10 (COQ10) 200 MG CAPS Take 200 mg by mouth daily.   empagliflozin (JARDIANCE) 25 MG TABS tablet Take 1 tablet (25 mg total) by mouth daily before breakfast.   ezetimibe (ZETIA) 10 MG tablet Take 1 tablet (10 mg total) by mouth daily.   FLUoxetine (PROZAC) 40 MG capsule Take 1 capsule (40 mg total) by mouth every morning.   Garlic 680 MG CAPS Take 500 mg by mouth daily.   glucose blood test strip Use to check blood sugars daily 3 times daily  E11.65   insulin degludec (TRESIBA FLEXTOUCH) 100 UNIT/ML FlexTouch Pen Inject 50 Units into the skin daily. (Patient taking differently: Inject 55 Units into the skin daily.)   Insulin Pen Needle (PEN NEEDLES) 32G X 4 MM MISC Use as directed   lamoTRIgine (LAMICTAL) 150 MG tablet Take 1 tablet (150 mg total) by mouth 2 (two) times daily.   LORazepam (ATIVAN) 1 MG tablet TAKE 1 TABLET BY MOUTH THREE TIMES DAILY  AS NEEDED FOR ANXIETY. GENERIC EQUIVALENT FOR ATIVAN.   Menthol-Methyl Salicylate (ARTHRITIS HOT) 10-15 % CREA Apply 1 application topically daily as needed (pain).   Menthol-Methyl Salicylate (ICY HOT) 33-82 % STCK Apply 1 application topically 3 (three) times daily as needed (pain).   metFORMIN (GLUCOPHAGE) 1000 MG tablet TAKE 1 TABLET BY MOUTH 2 TIMES DAILY WITH MEALS   metoprolol tartrate (LOPRESSOR) 25 MG tablet TAKE 1 TABLET BY MOUTH 2 TIMES DAILY. GENERIC EQUIVALENT FOR LOPRESSOR.   Multiple Vitamin (MULTIVITAMIN  WITH MINERALS) TABS tablet Take 1 tablet by mouth daily.   Omega-3 Fatty Acids (FISH OIL) 1000 MG CAPS Take 2,000 mg by mouth daily.   omeprazole (PRILOSEC) 40 MG capsule Take 1 capsule (40 mg total) by mouth daily.   Semaglutide (RYBELSUS) 14 MG TABS Take 14 mg by mouth daily at 6 (six) AM.   valsartan (DIOVAN) 320 MG tablet Take 1 tablet (320 mg total) by mouth daily.   No facility-administered encounter medications on file as of 04/02/2021.    Allergies (verified) Patient has no known allergies.   History: Past Medical History:  Diagnosis Date   Acid reflux disease    Bipolar 1 disorder, mixed (HCC)    BPH (benign prostatic hypertrophy)    CVA (cerebral vascular accident) ([place]) 07/2015   Gastritis    H/O hiatal hernia    Hyperlipidemia    Hypertension    Normal nuclear stress test Dec 2011   Obesity    TIA (transient ischemic attack) 05/15/2012   "they think I've had one this am @ 0130" (05/15/2012)   Type II diabetes mellitus ([place])    Vertigo    Past Surgical History:  Procedure Laterality Date   BILIARY STENT PLACEMENT N/A 09/17/2016   Procedure: BILIARY STENT PLACEMENT;  Surgeon: [name], MD;  [facility];  Service: Endoscopy;  Laterality: N/A;   CARDIOVASCULAR STRESS TEST  2009   EF 63%   CHOLECYSTECTOMY N/A 08/25/2016   Procedure: LAPAROSCOPIC CHOLECYSTECTOMY;  Surgeon: [name], MD;  Location: [facility];  Service: General;  Laterality: N/A;   COLONOSCOPY     COLONOSCOPY WITH PROPOFOL N/A 02/03/2021   Procedure: COLONOSCOPY WITH PROPOFOL;  Surgeon: Harvel Quale, MD;  [facility];  Service: Gastroenterology;  Laterality: N/A;    incomplete colonoscopy poor prep   COLONOSCOPY WITH PROPOFOL N/A 02/04/2021   Procedure: COLONOSCOPY WITH PROPOFOL;  Surgeon: Harvel Quale, MD;  [facility];  Service: Gastroenterology;  Laterality: N/A;  1:45   ERCP N/A 09/17/2016   Procedure: ENDOSCOPIC RETROGRADE  CHOLANGIOPANCREATOGRAPHY (ERCP);  Surgeon: [name], MD;  [facility];  Service: Endoscopy;  Laterality: N/A;  730   ERCP N/A 12/03/2016   Procedure: ENDOSCOPIC RETROGRADE CHOLANGIOPANCREATOGRAPHY (ERCP);  Surgeon: [name], MD;  [facility];  Service: Gastroenterology;  Laterality: N/A;   ESOPHAGEAL DILATION  02/03/2021   Procedure: ESOPHAGEAL DILATION;  Surgeon: Montez Morita, Quillian Quince, MD;  [facility];  Service: Gastroenterology;;  savory 16 and 18 fr   ESOPHAGOGASTRODUODENOSCOPY     ESOPHAGOGASTRODUODENOSCOPY (EGD) WITH PROPOFOL N/A 02/03/2021   Procedure: ESOPHAGOGASTRODUODENOSCOPY (EGD) WITH PROPOFOL;  Surgeon: Harvel Quale, MD;  [facility];  Service: Gastroenterology;  Laterality: N/A;   GASTROINTESTINAL STENT REMOVAL N/A 12/03/2016   Procedure: BILIARY STENT REMOVAL;  Surgeon: [name], MD;  [facility];  Service: Gastroenterology;  Laterality: N/A;   NO PAST SURGERIES  POLYPECTOMY  02/04/2021   Procedure: POLYPECTOMY INTESTINAL;  Surgeon: Harvel Quale, MD;  [facility];  Service: Gastroenterology;;   SPHINCTEROTOMY N/A 09/17/2016   Procedure: Joan Mayans;  Surgeon: [name], MD;  [facility];  Service: Endoscopy;  Laterality: N/A;   Family History  Problem Relation Age of Onset   Hyperlipidemia Father        in a nursing home   Colon cancer Father    Stroke Mother 46   Social History   Socioeconomic History   Marital status: Widowed    Spouse name: Not on file   Number of children: Not on file   Years of education: Not on file   Highest education level: Not on file  Occupational History   Occupation: disabled  Tobacco Use   Smoking status: Never   Smokeless tobacco: Never  Vaping Use   Vaping Use: Never used  Substance and Sexual Activity   Alcohol use: No   Drug use: No   Sexual activity: Not Currently  Other Topics Concern    Not on file  Social History Narrative   Not on file   Social Determinants of Health   Financial Resource Strain: Low Risk    Difficulty of Paying Living Expenses: Not hard at all  Food Insecurity: No Food Insecurity   Worried About Charity fundraiser in the Last Year: Never true   [place] in the Last Year: Never true  Transportation Needs: No Transportation Needs   Lack of Transportation (Medical): No   Lack of Transportation (Non-Medical): No  Physical Activity: Inactive   Days of Exercise per Week: 0 days   Minutes of Exercise per Session: 0 min  Stress: No Stress Concern Present   Feeling of Stress : Not at all  Social Connections: Not on file    Tobacco Counseling Counseling given: Not Answered   Clinical Intake:  Pre-visit preparation completed: Yes  Pain : No/denies pain     Nutritional Status: BMI > 30  Obese Nutritional Risks: None Diabetes: Yes  How often do you need to have someone help you when you read instructions, pamphlets, or other written materials from your doctor or pharmacy?: 1 - Never What is the last grade level you completed in school?: 12th grade  Diabetic? Yes Nutrition Risk Assessment:  Has the patient had any N/V/D within the last 2 months?  No  Does the patient have any non-healing wounds?  No  Has the patient had any unintentional weight loss or weight gain?  No   Diabetes:  Is the patient diabetic?  Yes  If diabetic, was a CBG obtained today?  No  Did the patient bring in their glucometer from home?  No  How often do you monitor your CBG's? daily.   Financial Strains and Diabetes Management:  Are you having any financial strains with the device, your supplies or your medication? No .  Does the patient want to be seen by Chronic Care Management for management of their diabetes?  No  Would the patient like to be referred to a Nutritionist or for Diabetic Management?  No   Diabetic Exams:  Diabetic Eye Exam: Overdue  for diabetic eye exam. Pt has been advised about the importance in completing this exam. Patient advised to call and schedule an eye exam. Diabetic Foot Exam: Completed 06/30/2020    Interpreter Needed?: No  Information entered by :: NAllen LPN   Activities of Daily Living  In your present state of health, do you have any difficulty performing the following activities: 04/02/2021 01/30/2021  Hearing? N N  Vision? N N  Difficulty concentrating or making decisions? N N  Walking or climbing stairs? N N  Dressing or bathing? N N  Doing errands, shopping? N N  Preparing Food and eating ? N -  Using the Toilet? N -  In the past six months, have you accidently leaked urine? Y -  Do you have problems with loss of bowel control? N -  Managing your Medications? N -  Managing your Finances? N -  Housekeeping or managing your Housekeeping? N -  Some recent data might be hidden    Patient Care Team: [name], FNP as PCP - General (General Practice) Nahser, Wonda Cheng, MD as PCP - Cardiology (Cardiology) Rex Kras, Claudette Stapler, RN as Case Manager Caudill, Kennieth Francois, [facility] (Inactive) (Pharmacist)  Indicate any recent Medical Services you may have received from other than [facility] providers in the past year (date may be approximate).     Assessment:   This is a routine wellness examination for Alexio.  Hearing/Vision screen Vision Screening - Comments:: Regular eye exams, [facility], [facility]  Dietary issues and exercise activities discussed: Current Exercise Habits: The patient does not participate in regular exercise at present   Goals Addressed             This Visit's Progress    Patient Stated       04/02/2021, no goals       Depression Screen PHQ 2/9 Scores 04/02/2021 12/31/2020 09/30/2020 06/30/2020 03/26/2020 11/06/2019 07/04/2019  PHQ - 2 Score 0 0 0 0 0 0 0  PHQ- 9 Score - 0 0 1 0 0 0  Exception Documentation - - - - Other- indicate reason in comment box - -  Not completed - - - -  just completed by CMA - -    Fall Risk Fall Risk  04/02/2021 03/26/2020 07/04/2019 03/21/2019 03/21/2019  Falls in the past year? 1 1 0 1 1  Comment tripped over something lost balance, tripped x 2 - got feet crossed up, tripped over something -  Number falls in past yr: 1 1 - 1 0  Injury with Fall? 0 0 - 0 0  Risk for fall due to : History of fall(s);Medication side effect Medication side effect;History of fall(s) - Medication side effect;History of fall(s) -  Follow up Falls evaluation completed;Education provided;Falls prevention discussed Falls evaluation completed;Education provided;Falls prevention discussed - Falls evaluation completed;Education provided;Falls prevention discussed -    FALL RISK PREVENTION PERTAINING TO THE HOME:  Any stairs in or around the home? No  If so, are there any without handrails? N/a Home free of loose throw rugs in walkways, pet beds, electrical cords, etc? Yes  Adequate lighting in your home to reduce risk of falls? Yes   ASSISTIVE DEVICES UTILIZED TO PREVENT FALLS:  Life alert? No  Use of a cane, walker or w/c? No  Grab bars in the bathroom? Yes  Shower chair or bench in shower? Yes  Elevated toilet seat or a handicapped toilet? No   TIMED UP AND GO:  Was the test performed? No .      Cognitive Function:     6CIT Screen 04/02/2021 03/26/2020 03/21/2019  What Year? 0 points 0 points 0 points  What month? 0 points 0 points 0 points  What time? 0 points 0 points 0 points  Count back from 20  0 points 0 points 0 points  Months in reverse 0 points 0 points 0 points  Repeat phrase 4 points 2 points 8 points  Total Score 4 2 8     Immunizations Immunization History  Administered Date(s) Administered   DTaP 10/05/2011   Influenza, High Dose Seasonal PF 01/15/2019, 12/31/2020   Influenza-Unspecified 03/03/2012, 03/16/2018, 03/21/2020   Moderna Sars-Covid-2 Vaccination 12/07/2019, 01/04/2020, 07/18/2020, 12/05/2020   Pneumococcal  Conjugate-13 03/21/2019   Pneumococcal Polysaccharide-23 05/16/2012   Tdap 10/01/2020   Zoster Recombinat (Shingrix) 03/21/2019, 11/19/2019    TDAP status: Up to date  Flu Vaccine status: Up to date  Pneumococcal vaccine status: Up to date  Covid-19 vaccine status: Completed vaccines  Qualifies for Shingles Vaccine? Yes   Zostavax completed No   Shingrix Completed?: Yes  Screening Tests Health Maintenance  Topic Date Due   Pneumonia Vaccine 33+ Years old (3 - PPSV23 if available, else PCV20) 03/20/2020   OPHTHALMOLOGY EXAM  01/01/2021   COVID-19 Vaccine (5 - Booster for Moderna series) 01/30/2021   FOOT EXAM  06/30/2021   HEMOGLOBIN A1C  06/30/2021   TETANUS/TDAP  10/02/2030   COLONOSCOPY (Pts 45-70yrs Insurance coverage will need to be confirmed)  02/05/2031   INFLUENZA VACCINE  Completed   Hepatitis C Screening  Completed   Zoster Vaccines- Shingrix  Completed   HPV VACCINES  Aged Out    Health Maintenance  Health Maintenance Due  Topic Date Due   Pneumonia Vaccine 25+ Years old (3 - PPSV23 if available, else PCV20) 03/20/2020   OPHTHALMOLOGY EXAM  01/01/2021   COVID-19 Vaccine (5 - Booster for Moderna series) 01/30/2021    Colorectal cancer screening: Type of screening: Colonoscopy. Completed 02/04/2021. Repeat every 1 years  Lung Cancer Screening: (Low Dose CT Chest recommended if Age 76-80 years, 30 pack-year currently smoking OR have quit w/in 15years.) does not qualify.   Lung Cancer Screening Referral: no  Additional Screening:  Hepatitis C Screening: does qualify; Completed 03/24/2018  Vision Screening: Recommended annual ophthalmology exams for early detection of glaucoma and other disorders of the eye. Is the patient up to date with their annual eye exam?  No  Who is the provider or what is the name of the office in which the patient attends annual eye exams? [facility] If pt is not established with a provider, would they like to be referred  to a provider to establish care? No .   Dental Screening: Recommended annual dental exams for proper oral hygiene  Community Resource Referral / Chronic Care Management: CRR required this visit?  No   CCM required this visit?  No      Plan:     I have personally reviewed and noted the following in the patient's chart:   Medical and social history Use of alcohol, tobacco or illicit drugs  Current medications and supplements including opioid prescriptions. Patient is not currently taking opioid prescriptions. Functional ability and status Nutritional status Physical activity Advanced directives List of other physicians Hospitalizations, surgeries, and ER visits in previous 12 months Vitals Screenings to include cognitive, depression, and falls Referrals and appointments  In addition, I have reviewed and discussed with patient certain preventive protocols, quality metrics, and best practice recommendations. A written personalized care plan for preventive services as well as general preventive health recommendations were provided to patient.     Kellie Simmering, LPN   31/09/4006   Nurse Notes: none

## 2021-04-02 NOTE — Patient Instructions (Signed)
Robert Walters , Thank you for taking time to come for your Medicare Wellness Visit. I appreciate your ongoing commitment to your health goals. Please review the following plan we discussed and let me know if I can assist you in the future.   Screening recommendations/referrals: Colonoscopy: completed 02/04/2021, due 02/04/2022 Recommended yearly ophthalmology/optometry visit for glaucoma screening and checkup Recommended yearly dental visit for hygiene and checkup  Vaccinations: Influenza vaccine: completed 12/31/2020 Pneumococcal vaccine: completed 03/21/2019 Tdap vaccine: completed 10/01/2020, due 10/02/2030 Shingles vaccine: completed   Covid-19:  12/05/2020, 07/18/2020, 01/04/2020, 12/07/2019  Advanced directives: Advance directive discussed with you today.   Conditions/risks identified: none  Next appointment: Follow up in one year for your annual wellness visit.   Preventive Care 67 Years and Older, Male Preventive care refers to lifestyle choices and visits with your health care provider that can promote health and wellness. What does preventive care include? A yearly physical exam. This is also called an annual well check. Dental exams once or twice a year. Routine eye exams. Ask your health care provider how often you should have your eyes checked. Personal lifestyle choices, including: Daily care of your teeth and gums. Regular physical activity. Eating a healthy diet. Avoiding tobacco and drug use. Limiting alcohol use. Practicing safe sex. Taking low doses of aspirin every day. Taking vitamin and mineral supplements as recommended by your health care provider. What happens during an annual well check? The services and screenings done by your health care provider during your annual well check will depend on your age, overall health, lifestyle risk factors, and family history of disease. Counseling  Your health care provider may ask you questions about your: Alcohol use. Tobacco  use. Drug use. Emotional well-being. Home and relationship well-being. Sexual activity. Eating habits. History of falls. Memory and ability to understand (cognition). Work and work Statistician. Screening  You may have the following tests or measurements: Height, weight, and BMI. Blood pressure. Lipid and cholesterol levels. These may be checked every 5 years, or more frequently if you are over 61 years old. Skin check. Lung cancer screening. You may have this screening every year starting at age 82 if you have a 30-pack-year history of smoking and currently smoke or have quit within the past 15 years. Fecal occult blood test (FOBT) of the stool. You may have this test every year starting at age 6. Flexible sigmoidoscopy or colonoscopy. You may have a sigmoidoscopy every 5 years or a colonoscopy every 10 years starting at age 101. Prostate cancer screening. Recommendations will vary depending on your family history and other risks. Hepatitis C blood test. Hepatitis B blood test. Sexually transmitted disease (STD) testing. Diabetes screening. This is done by checking your blood sugar (glucose) after you have not eaten for a while (fasting). You may have this done every 1-3 years. Abdominal aortic aneurysm (AAA) screening. You may need this if you are a current or former smoker. Osteoporosis. You may be screened starting at age 38 if you are at high risk. Talk with your health care provider about your test results, treatment options, and if necessary, the need for more tests. Vaccines  Your health care provider may recommend certain vaccines, such as: Influenza vaccine. This is recommended every year. Tetanus, diphtheria, and acellular pertussis (Tdap, Td) vaccine. You may need a Td booster every 10 years. Zoster vaccine. You may need this after age 31. Pneumococcal 13-valent conjugate (PCV13) vaccine. One dose is recommended after age 41. Pneumococcal polysaccharide (PPSV23) vaccine.  One dose is recommended after age 55. Talk to your health care provider about which screenings and vaccines you need and how often you need them. This information is not intended to replace advice given to you by your health care provider. Make sure you discuss any questions you have with your health care provider. Document Released: 05/16/2015 Document Revised: 01/07/2016 Document Reviewed: 02/18/2015 Elsevier Interactive Patient Education  2017 [employer] Prevention in the Home Falls can cause injuries. They can happen to people of all ages. There are many things you can do to make your home safe and to help prevent falls. What can I do on the outside of my home? Regularly fix the edges of walkways and driveways and fix any cracks. Remove anything that might make you trip as you walk through a door, such as a raised step or threshold. Trim any bushes or trees on the path to your home. Use bright outdoor lighting. Clear any walking paths of anything that might make someone trip, such as rocks or tools. Regularly check to see if handrails are loose or broken. Make sure that both sides of any steps have handrails. Any raised decks and porches should have guardrails on the edges. Have any leaves, snow, or ice cleared regularly. Use sand or salt on walking paths during winter. Clean up any spills in your garage right away. This includes oil or grease spills. What can I do in the bathroom? Use night lights. Install grab bars by the toilet and in the tub and shower. Do not use towel bars as grab bars. Use non-skid mats or decals in the tub or shower. If you need to sit down in the shower, use a plastic, non-slip stool. Keep the floor dry. Clean up any water that spills on the floor as soon as it happens. Remove soap buildup in the tub or shower regularly. Attach bath mats securely with double-sided non-slip rug tape. Do not have throw rugs and other things on the floor that can make  you trip. What can I do in the bedroom? Use night lights. Make sure that you have a light by your bed that is easy to reach. Do not use any sheets or blankets that are too big for your bed. They should not hang down onto the floor. Have a firm chair that has side arms. You can use this for support while you get dressed. Do not have throw rugs and other things on the floor that can make you trip. What can I do in the kitchen? Clean up any spills right away. Avoid walking on wet floors. Keep items that you use a lot in easy-to-reach places. If you need to reach something above you, use a strong step stool that has a grab bar. Keep electrical cords out of the way. Do not use floor polish or wax that makes floors slippery. If you must use wax, use non-skid floor wax. Do not have throw rugs and other things on the floor that can make you trip. What can I do with my stairs? Do not leave any items on the stairs. Make sure that there are handrails on both sides of the stairs and use them. Fix handrails that are broken or loose. Make sure that handrails are as long as the stairways. Check any carpeting to make sure that it is firmly attached to the stairs. Fix any carpet that is loose or worn. Avoid having throw rugs at the top or bottom of the  stairs. If you do have throw rugs, attach them to the floor with carpet tape. Make sure that you have a light switch at the top of the stairs and the bottom of the stairs. If you do not have them, ask someone to add them for you. What else can I do to help prevent falls? Wear shoes that: Do not have high heels. Have rubber bottoms. Are comfortable and fit you well. Are closed at the toe. Do not wear sandals. If you use a stepladder: Make sure that it is fully opened. Do not climb a closed stepladder. Make sure that both sides of the stepladder are locked into place. Ask someone to hold it for you, if possible. Clearly mark and make sure that you can  see: Any grab bars or handrails. First and last steps. Where the edge of each step is. Use tools that help you move around (mobility aids) if they are needed. These include: Canes. Walkers. Scooters. Crutches. Turn on the lights when you go into a dark area. Replace any light bulbs as soon as they burn out. Set up your furniture so you have a clear path. Avoid moving your furniture around. If any of your floors are uneven, fix them. If there are any pets around you, be aware of where they are. Review your medicines with your doctor. Some medicines can make you feel dizzy. This can increase your chance of falling. Ask your doctor what other things that you can do to help prevent falls. This information is not intended to replace advice given to you by your health care provider. Make sure you discuss any questions you have with your health care provider. Document Released: 02/13/2009 Document Revised: 09/25/2015 Document Reviewed: 05/24/2014 Elsevier Interactive Patient Education  2017 [employer].

## 2021-04-03 ENCOUNTER — Telehealth: Payer: Medicare Other

## 2021-04-03 ENCOUNTER — Telehealth: Payer: Self-pay

## 2021-04-03 NOTE — Telephone Encounter (Signed)
  Care Management   Follow Up Note   04/03/2021 Name: Robert Walters MRN: [id number] DOB: March 17, 1956   Referred by: [name], FNP Reason for referral : Chronic Care Management (RN CM Follow up call )   An unsuccessful telephone outreach was attempted today. The patient was referred to the case management team for assistance with care management and care coordination.   Follow Up Plan: Telephone follow up appointment with care management team member scheduled for: 04/20/21  Barb Merino, RN, BSN, CCM Care Management Coordinator [facility]  Direct Phone: [phone]

## 2021-04-06 ENCOUNTER — Telehealth: Payer: Self-pay

## 2021-04-06 DIAGNOSIS — I739 Peripheral vascular disease, unspecified: Secondary | ICD-10-CM | POA: Diagnosis not present

## 2021-04-06 NOTE — Chronic Care Management (AMB) (Signed)
    Chronic Care Management Pharmacy Assistant   Name: Robert Walters  MRN: [id number] DOB: 1956-04-08   Reason for Encounter: 2023 PAP   Medications: Outpatient Encounter Medications as of 04/06/2021  Medication Sig Note   alfuzosin (UROXATRAL) 10 MG 24 hr tablet Take 1 tablet (10 mg total) by mouth daily. (Patient taking differently: Take 10 mg by mouth in the morning and at bedtime.)    Ascorbic Acid (VITAMIN C) 500 MG CAPS Take 500 mg by mouth daily.     aspirin EC 81 MG tablet Take 1 tablet (81 mg total) by mouth daily.    atorvastatin (LIPITOR) 80 MG tablet Take 80 mg by mouth daily.    b complex vitamins tablet Take 1 tablet by mouth daily.     cholecalciferol (VITAMIN D3) 25 MCG (1000 UNIT) tablet Take 2,000 Units by mouth daily.    CINNAMON PO Take 2,000 mg by mouth daily.    Coenzyme Q10 (COQ10) 200 MG CAPS Take 200 mg by mouth daily.    empagliflozin (JARDIANCE) 25 MG TABS tablet Take 1 tablet (25 mg total) by mouth daily before breakfast.    ezetimibe (ZETIA) 10 MG tablet Take 1 tablet (10 mg total) by mouth daily.    FLUoxetine (PROZAC) 40 MG capsule Take 1 capsule (40 mg total) by mouth every morning.    Garlic 357 MG CAPS Take 500 mg by mouth daily.    glucose blood test strip Use to check blood sugars daily 3 times daily  E11.65    insulin degludec (TRESIBA FLEXTOUCH) 100 UNIT/ML FlexTouch Pen Inject 50 Units into the skin daily. (Patient taking differently: Inject 55 Units into the skin daily.) 02/03/2021: Took 27 units last night   Insulin Pen Needle (PEN NEEDLES) 32G X 4 MM MISC Use as directed    lamoTRIgine (LAMICTAL) 150 MG tablet Take 1 tablet (150 mg total) by mouth 2 (two) times daily.    LORazepam (ATIVAN) 1 MG tablet TAKE 1 TABLET BY MOUTH THREE TIMES DAILY AS NEEDED FOR ANXIETY. GENERIC EQUIVALENT FOR ATIVAN.    Menthol-Methyl Salicylate (ARTHRITIS HOT) 10-15 % CREA Apply 1 application topically daily as needed (pain).    Menthol-Methyl Salicylate (ICY HOT)  01-77 % STCK Apply 1 application topically 3 (three) times daily as needed (pain).    metFORMIN (GLUCOPHAGE) 1000 MG tablet TAKE 1 TABLET BY MOUTH 2 TIMES DAILY WITH MEALS    metoprolol tartrate (LOPRESSOR) 25 MG tablet TAKE 1 TABLET BY MOUTH 2 TIMES DAILY. GENERIC EQUIVALENT FOR LOPRESSOR.    Multiple Vitamin (MULTIVITAMIN WITH MINERALS) TABS tablet Take 1 tablet by mouth daily.    Omega-3 Fatty Acids (FISH OIL) 1000 MG CAPS Take 2,000 mg by mouth daily.    omeprazole (PRILOSEC) 40 MG capsule Take 1 capsule (40 mg total) by mouth daily.    Semaglutide (RYBELSUS) 14 MG TABS Take 14 mg by mouth daily at 6 (six) AM.    valsartan (DIOVAN) 320 MG tablet Take 1 tablet (320 mg total) by mouth daily.    No facility-administered encounter medications on file as of 04/06/2021.   04-06-2021: Initiated 2023 PAP for Jardiance to mail.  [place] Pharmacist Assistant [phone]

## 2021-04-14 ENCOUNTER — Other Ambulatory Visit: Payer: Self-pay

## 2021-04-14 ENCOUNTER — Ambulatory Visit (INDEPENDENT_AMBULATORY_CARE_PROVIDER_SITE_OTHER): Payer: Medicare Other | Admitting: Nurse Practitioner

## 2021-04-14 ENCOUNTER — Encounter: Payer: Self-pay | Admitting: Nurse Practitioner

## 2021-04-14 VITALS — BP 130/70 | HR 87 | Temp 97.9°F | Ht 68.8 in | Wt 237.8 lb

## 2021-04-14 DIAGNOSIS — Z0001 Encounter for general adult medical examination with abnormal findings: Secondary | ICD-10-CM | POA: Diagnosis not present

## 2021-04-14 DIAGNOSIS — E782 Mixed hyperlipidemia: Secondary | ICD-10-CM | POA: Diagnosis not present

## 2021-04-14 DIAGNOSIS — N1831 Chronic kidney disease, stage 3a: Secondary | ICD-10-CM | POA: Diagnosis not present

## 2021-04-14 DIAGNOSIS — E119 Type 2 diabetes mellitus without complications: Secondary | ICD-10-CM | POA: Diagnosis not present

## 2021-04-14 DIAGNOSIS — H6123 Impacted cerumen, bilateral: Secondary | ICD-10-CM | POA: Diagnosis not present

## 2021-04-14 DIAGNOSIS — I129 Hypertensive chronic kidney disease with stage 1 through stage 4 chronic kidney disease, or unspecified chronic kidney disease: Secondary | ICD-10-CM | POA: Diagnosis not present

## 2021-04-14 DIAGNOSIS — I10 Essential (primary) hypertension: Secondary | ICD-10-CM | POA: Diagnosis not present

## 2021-04-14 DIAGNOSIS — Z6835 Body mass index (BMI) 35.0-35.9, adult: Secondary | ICD-10-CM

## 2021-04-14 DIAGNOSIS — Z125 Encounter for screening for malignant neoplasm of prostate: Secondary | ICD-10-CM

## 2021-04-14 DIAGNOSIS — M791 Myalgia, unspecified site: Secondary | ICD-10-CM

## 2021-04-14 DIAGNOSIS — Z0000 Encounter for general adult medical examination without abnormal findings: Secondary | ICD-10-CM

## 2021-04-14 LAB — POCT URINALYSIS DIPSTICK
Bilirubin, UA: NEGATIVE
Blood, UA: NEGATIVE
Glucose, UA: POSITIVE — AB
Ketones, UA: NEGATIVE
Leukocytes, UA: NEGATIVE
Nitrite, UA: NEGATIVE
Protein, UA: NEGATIVE
Spec Grav, UA: 1.02 (ref 1.010–1.025)
Urobilinogen, UA: 0.2 E.U./dL
pH, UA: 5 (ref 5.0–8.0)

## 2021-04-14 LAB — POCT UA - MICROALBUMIN
Albumin/Creatinine Ratio, Urine, POC: <30
Creatinine, POC: 200 mg/dL
Microalbumin Ur, POC: 10 mg/L

## 2021-04-14 NOTE — Progress Notes (Signed)
I,Tianna Badgett,acting as a Education administrator for [employer], NP.,have documented all relevant documentation on the behalf of [employer], NP,as directed by  Bary Castilla, NP while in the presence of Bary Castilla, NP.  This visit occurred during the SARS-CoV-2 public health emergency.  Safety protocols were in place, including screening questions prior to the visit, additional usage of staff PPE, and extensive cleaning of exam room while observing appropriate contact time as indicated for disinfecting solutions.  Subjective:     Patient ID: Robert Walters , male    DOB: Apr 26, 1956 , 65 y.o.   MRN: [id number]   Chief Complaint  Patient presents with   Annual Exam    HPI  Patient is here for physical exam.  Diet: He is avoiding sugary and salty food.  Exercise: He does get some walking done.  Drink or smoke: No  He is urinating okay at home.  He sees a GI, cardiologist.    Diabetes He presents for his follow-up diabetic visit. He has type 2 diabetes mellitus. His disease course has been stable. There are no hypoglycemic associated symptoms. Pertinent negatives for hypoglycemia include no dizziness or headaches. There are no diabetic associated symptoms. Pertinent negatives for diabetes include no chest pain, no fatigue, no polydipsia, no polyphagia, no polyuria and no weakness. There are no hypoglycemic complications. Symptoms are stable. Diabetic complications include heart disease. Risk factors for coronary artery disease include diabetes mellitus, sedentary lifestyle, male sex and obesity. Current diabetic treatment includes oral agent (monotherapy). He is compliant with treatment all of the time. He is following a generally healthy diet. He has not had a previous visit with a dietitian. He rarely participates in exercise. (Blood sugar have been 130 - 150 mostly, occasionally will go up 200.  ) An ACE inhibitor/angiotensin II receptor blocker is being taken. Eye exam is  current.  Hypertension This is a chronic problem. The current episode started more than 1 year ago. The problem is unchanged. The problem is controlled. Associated symptoms include neck pain. Pertinent negatives include no anxiety, chest pain, headaches, palpitations or shortness of breath. There are no associated agents to hypertension. Risk factors for coronary artery disease include obesity and sedentary lifestyle. There are no compliance problems.     Past Medical History:  Diagnosis Date   Acid reflux disease    Bipolar 1 disorder, mixed (HCC)    BPH (benign prostatic hypertrophy)    CVA (cerebral vascular accident) ([place]) 07/2015   Gastritis    H/O hiatal hernia    Hyperlipidemia    Hypertension    Normal nuclear stress test Dec 2011   Obesity    TIA (transient ischemic attack) 05/15/2012   "they think I've had one this am @ 0130" (05/15/2012)   Type II diabetes mellitus ([place])    Vertigo      Family History  Problem Relation Age of Onset   Hyperlipidemia Father        in a nursing home   Colon cancer Father    Stroke Mother 35     Current Outpatient Medications:    alfuzosin (UROXATRAL) 10 MG 24 hr tablet, Take 1 tablet (10 mg total) by mouth daily. (Patient taking differently: Take 10 mg by mouth in the morning and at bedtime.), Disp: 30 tablet, Rfl: 2   Ascorbic Acid (VITAMIN C) 500 MG CAPS, Take 500 mg by mouth daily. , Disp: , Rfl:    aspirin EC 81 MG tablet, Take 1 tablet (81 mg total)  by mouth daily., Disp: , Rfl:    atorvastatin (LIPITOR) 80 MG tablet, Take 80 mg by mouth daily., Disp: , Rfl:    b complex vitamins tablet, Take 1 tablet by mouth daily. , Disp: , Rfl:    cholecalciferol (VITAMIN D3) 25 MCG (1000 UNIT) tablet, Take 2,000 Units by mouth daily., Disp: , Rfl:    CINNAMON PO, Take 2,000 mg by mouth daily., Disp: , Rfl:    Coenzyme Q10 (COQ10) 200 MG CAPS, Take 200 mg by mouth daily., Disp: , Rfl:    empagliflozin (JARDIANCE) 25 MG TABS tablet, Take 1 tablet  (25 mg total) by mouth daily before breakfast., Disp: 30 tablet, Rfl: 2   ezetimibe (ZETIA) 10 MG tablet, Take 1 tablet (10 mg total) by mouth daily., Disp: 90 tablet, Rfl: 3   FLUoxetine (PROZAC) 40 MG capsule, Take 1 capsule (40 mg total) by mouth every morning., Disp: 90 capsule, Rfl: 1   Garlic 546 MG CAPS, Take 500 mg by mouth daily., Disp: , Rfl:    glucose blood test strip, Use to check blood sugars daily 3 times daily  E11.65, Disp: 200 each, Rfl: 3   insulin degludec (TRESIBA FLEXTOUCH) 100 UNIT/ML FlexTouch Pen, Inject 50 Units into the skin daily. (Patient taking differently: Inject 55 Units into the skin daily.), Disp: 15 mL, Rfl: 0   Insulin Pen Needle (PEN NEEDLES) 32G X 4 MM MISC, Use as directed, Disp: 35 each, Rfl: 0   lamoTRIgine (LAMICTAL) 150 MG tablet, Take 1 tablet (150 mg total) by mouth 2 (two) times daily., Disp: 180 tablet, Rfl: 1   LORazepam (ATIVAN) 1 MG tablet, TAKE 1 TABLET BY MOUTH THREE TIMES DAILY AS NEEDED FOR ANXIETY. GENERIC EQUIVALENT FOR ATIVAN., Disp: 270 tablet, Rfl: 1   Menthol-Methyl Salicylate (ARTHRITIS HOT) 10-15 % CREA, Apply 1 application topically daily as needed (pain)., Disp: , Rfl:    Menthol-Methyl Salicylate (ICY HOT) 27-03 % STCK, Apply 1 application topically 3 (three) times daily as needed (pain)., Disp: , Rfl:    metFORMIN (GLUCOPHAGE) 1000 MG tablet, TAKE 1 TABLET BY MOUTH 2 TIMES DAILY WITH MEALS, Disp: 180 tablet, Rfl: 1   metoprolol tartrate (LOPRESSOR) 25 MG tablet, TAKE 1 TABLET BY MOUTH 2 TIMES DAILY. GENERIC EQUIVALENT FOR LOPRESSOR., Disp: 180 tablet, Rfl: 3   Multiple Vitamin (MULTIVITAMIN WITH MINERALS) TABS tablet, Take 1 tablet by mouth daily., Disp: , Rfl:    Omega-3 Fatty Acids (FISH OIL) 1000 MG CAPS, Take 2,000 mg by mouth daily., Disp: , Rfl:    omeprazole (PRILOSEC) 40 MG capsule, Take 1 capsule (40 mg total) by mouth daily., Disp: 90 capsule, Rfl: 3   Semaglutide (RYBELSUS) 14 MG TABS, Take 14 mg by mouth daily at 6 (six)  AM., Disp: 90 tablet, Rfl: 0   valsartan (DIOVAN) 320 MG tablet, Take 1 tablet (320 mg total) by mouth daily., Disp: 90 tablet, Rfl: 3   No Known Allergies   Men's preventive visit. Patient Health Questionnaire (PHQ-2) is  [place] Office Visit from 04/14/2021 in [facility]  PHQ-2 Total Score 0     . Patient is on a healthy diet. Marital status: Widowed. Relevant history for alcohol use is:  Social History   Substance and Sexual Activity  Alcohol Use No  . Relevant history for tobacco use is:  Social History   Tobacco Use  Smoking Status Never  Smokeless Tobacco Never  .   Review of Systems  Constitutional: Negative.  Negative for  chills and fatigue.  HENT: Negative.  Negative for facial swelling, postnasal drip and rhinorrhea.   Eyes: Negative.   Respiratory: Negative.  Negative for cough, shortness of breath and wheezing.   Cardiovascular: Negative.  Negative for chest pain and palpitations.  Gastrointestinal:  Negative for constipation, diarrhea and vomiting.  Endocrine: Negative.  Negative for polydipsia, polyphagia and polyuria.  Genitourinary: Negative.   Musculoskeletal:  Positive for neck pain. Negative for arthralgias and myalgias.  Skin: Negative.   Allergic/Immunologic: Negative.   Neurological: Negative.  Negative for dizziness, weakness, numbness and headaches.  Hematological: Negative.   Psychiatric/Behavioral: Negative.      Today's Vitals   04/14/21 0926  BP: 130/70  Pulse: 87  Temp: 97.9 F (36.6 C)  TempSrc: Oral  Weight: 237 lb 12.8 oz (107.9 kg)  Height: 5' 8.8" (1.748 m)   Body mass index is 35.32 kg/m.  Wt Readings from Last 3 Encounters:  04/14/21 237 lb 12.8 oz (107.9 kg)  04/02/21 238 lb (108 kg)  04/01/21 238 lb (108 kg)    Objective:  Physical Exam Vitals and nursing note reviewed.  Constitutional:      Appearance: Normal appearance. He is obese.  HENT:     Head: Normocephalic and atraumatic.      Right Ear: Tympanic membrane, ear canal and external ear normal. There is impacted cerumen.     Left Ear: Tympanic membrane, ear canal and external ear normal. There is impacted cerumen.     Nose: Nose normal. No congestion or rhinorrhea.     Mouth/Throat:     Mouth: Mucous membranes are moist.     Pharynx: Oropharynx is clear.  Eyes:     Extraocular Movements: Extraocular movements intact.     Conjunctiva/sclera: Conjunctivae normal.     Pupils: Pupils are equal, round, and reactive to light.  Neck:     Comments: Tension around the shoulder/neck   Cardiovascular:     Rate and Rhythm: Normal rate and regular rhythm.     Pulses: Normal pulses.     Heart sounds: Normal heart sounds. No murmur heard. Pulmonary:     Effort: Pulmonary effort is normal. No respiratory distress.     Breath sounds: Normal breath sounds. No wheezing.  Chest:  Breasts:    Right: Normal. No swelling, bleeding, inverted nipple, mass or nipple discharge.     Left: Normal. No swelling, bleeding, inverted nipple, mass or nipple discharge.  Abdominal:     General: Abdomen is flat. Bowel sounds are normal.     Palpations: Abdomen is soft.  Genitourinary:    Prostate: Normal.     Rectum: Normal. Guaiac result negative.  Musculoskeletal:        General: No swelling. Normal range of motion.     Cervical back: Normal range of motion and neck supple. No edema or signs of trauma.  Skin:    General: Skin is warm and dry.     Capillary Refill: Capillary refill takes less than 2 seconds.  Neurological:     General: No focal deficit present.     Mental Status: He is alert.  Psychiatric:        Mood and Affect: Mood normal.        Behavior: Behavior normal.        Assessment And Plan:    1. Encounter for annual physical exam -Patient is here for their annual physical exam and we discussed any changes to medication and medical history.  -Behavior modification was discussed  as well as diet and exercise history   -Patient will continue to exercise regularly and modify their diet.  -Recommendation for yearly physical annuals, immunization and screenings including mammogram and colonoscopy were discussed with the patient.  -Recommended intake of multivitamin, vitamin D and calcium.  -Individualized advise was given to the patient pertaining to their own health history in regards to diet, exercise, medical condition and referrals.   2. Type 2 diabetes mellitus without complication, without long-term current use of insulin (HCC) -Chronic, stable, continue meds  --Discussed with patient the importance of glycemic control and long term complications from uncontrolled diabetes. Discussed with the patient the importance of compliance with home glucose monitoring, diet which includes decrease amount of sugary drinks and foods. Importance of exercise was also discussed with the patient. Importance of eye exams, self foot care and compliance to office visits was also discussed with the patient.  - Hemoglobin A1c  3. Essential hypertension -Chronic, continue meds  -Limit the intake of processed foods and salt intake. You should increase your intake of green vegetables and fruits. Limit the use of alcohol. Limit fast foods and fried foods. Avoid high fatty saturated and trans fat foods. Keep yourself hydrated with drinking water. Avoid red meats. Eat lean meats instead. Exercise for atleast 30-45 min for atleast 4-5 times a week.  - CBC - CMP14+EGFR - POCT Urinalysis Dipstick (81002) - POCT UA - Microalbumin - EKG 12-Lead  4. Stage 3a chronic kidney disease (HCC) -Stable, will check labs today   5. Mixed hyperlipidemia -Reviewed recent lipid panel results with patient.  -Followed by cardiologist  -Continue meds and lifestyle modification with diet and exercise   6. Encounter for prostate cancer screening -Reviewed most recent PSA results with patient from 12/31/20   7. Impacted cerumen of both ears -Use  curette to clean and flush both ears.   8. Muscle tension pain -Neck muscles are tense. Advised patient to use proper techniques with movement of neck and when sleeping. Will order xray or send to specialist if does not get better or worsen.   9. Class 2 severe obesity due to excess calories with serious comorbidity and body mass index (BMI) of 35.0 to 35.9 in adult [facility]) Advised patient on a healthy diet including avoiding fast food and red meats. Increase the intake of lean meats including grilled chicken and [place].  Drink a lot of water. Decrease intake of fatty foods. Exercise for 30-45 min. 4-5 a week to decrease the risk of cardiac event.   The patient was encouraged to call or send a message through [place] for any questions or concerns.   Follow up: if symptoms persist or do not get better.   Side effects and appropriate use of all the medication(s) were discussed with the patient today. Patient advised to use the medication(s) as directed by their healthcare provider. The patient was encouraged to read, review, and understand all associated package inserts and contact our office with any questions or concerns. The patient accepts the risks of the treatment plan and had an opportunity to ask questions.   Staying healthy and adopting a healthy lifestyle for your overall health is important. You should eat 7 or more servings of fruits and vegetables per day. You should drink plenty of water to keep yourself hydrated and your kidneys healthy. This includes about 65-80+ fluid ounces of water. Limit your intake of animal fats especially for elevated cholesterol. Avoid highly processed food and limit your salt intake if you  have hypertension. Avoid foods high in saturated/Trans fats. Along with a healthy diet it is also very important to maintain time for yourself to maintain a healthy mental health with low stress levels. You should get atleast 150 min of moderate intensity exercise weekly for a  healthy heart. Along with eating right and exercising, aim for at least 7-9 hours of sleep daily.  Eat more whole grains which includes barley, wheat berries, oats, brown rice and whole wheat pasta. Use healthy plant oils which include olive, soy, corn, sunflower and peanut. Limit your caffeine and sugary drinks. Limit your intake of fast foods. Limit milk and dairy products to one or two daily servings.   Patient was given opportunity to ask questions. Patient verbalized understanding of the plan and was able to repeat key elements of the plan. All questions were answered to their satisfaction.  Raman Shelley Cocke, DNP   I, Raman Andree Heeg have reviewed all documentation for this visit. The documentation on 04/14/21 for the exam, diagnosis, procedures, and orders are all accurate and complete.    THE PATIENT IS ENCOURAGED TO PRACTICE SOCIAL DISTANCING DUE TO THE COVID-19 PANDEMIC.

## 2021-04-14 NOTE — Patient Instructions (Signed)

## 2021-04-15 LAB — CMP14+EGFR
ALT: 51 IU/L — ABNORMAL HIGH (ref 0–44)
AST: 36 IU/L (ref 0–40)
Albumin/Globulin Ratio: 2.1 (ref 1.2–2.2)
Albumin: 4.7 g/dL (ref 3.8–4.8)
Alkaline Phosphatase: 166 IU/L — ABNORMAL HIGH (ref 44–121)
BUN/Creatinine Ratio: 19 (ref 10–24)
BUN: 27 mg/dL (ref 8–27)
Bilirubin Total: 0.7 mg/dL (ref 0.0–1.2)
CO2: 16 mmol/L — ABNORMAL LOW (ref 20–29)
Calcium: 9.1 mg/dL (ref 8.6–10.2)
Chloride: 101 mmol/L (ref 96–106)
Creatinine, Ser: 1.42 mg/dL — ABNORMAL HIGH (ref 0.76–1.27)
Globulin, Total: 2.2 g/dL (ref 1.5–4.5)
Glucose: 177 mg/dL — ABNORMAL HIGH (ref 70–99)
Potassium: 4.7 mmol/L (ref 3.5–5.2)
Sodium: 137 mmol/L (ref 134–144)
Total Protein: 6.9 g/dL (ref 6.0–8.5)
eGFR: 54 mL/min/{1.73_m2} — ABNORMAL LOW (ref >59)

## 2021-04-15 LAB — CBC
Hematocrit: 43.4 % (ref 37.5–51.0)
Hemoglobin: 14.6 g/dL (ref 13.0–17.7)
MCH: 29.7 pg (ref 26.6–33.0)
MCHC: 33.6 g/dL (ref 31.5–35.7)
MCV: 88 fL (ref 79–97)
Platelets: 157 10*3/uL (ref 150–450)
RBC: 4.91 x10E6/uL (ref 4.14–5.80)
RDW: 14.6 % (ref 11.6–15.4)
WBC: 8.7 10*3/uL (ref 3.4–10.8)

## 2021-04-15 LAB — HEMOGLOBIN A1C
Est. average glucose Bld gHb Est-mCnc: 146 mg/dL
Hgb A1c MFr Bld: 6.7 % — ABNORMAL HIGH (ref 4.8–5.6)

## 2021-04-16 ENCOUNTER — Encounter: Payer: Medicare Other | Admitting: Nurse Practitioner

## 2021-04-20 ENCOUNTER — Telehealth: Payer: Medicare Other

## 2021-04-20 ENCOUNTER — Ambulatory Visit (INDEPENDENT_AMBULATORY_CARE_PROVIDER_SITE_OTHER): Payer: Medicare Other

## 2021-04-20 DIAGNOSIS — I10 Essential (primary) hypertension: Secondary | ICD-10-CM

## 2021-04-20 DIAGNOSIS — E119 Type 2 diabetes mellitus without complications: Secondary | ICD-10-CM

## 2021-04-20 DIAGNOSIS — N1831 Chronic kidney disease, stage 3a: Secondary | ICD-10-CM

## 2021-04-20 DIAGNOSIS — E782 Mixed hyperlipidemia: Secondary | ICD-10-CM

## 2021-04-21 NOTE — Patient Instructions (Signed)
Visit Information   Thank you for taking time to visit with me today. Please don't hesitate to contact me if I can be of assistance to you before our next scheduled telephone appointment.  Following are the goals we discussed today:  (Copy and paste patient goals from clinical care plan here)  Our next appointment is by telephone on 07/27/21 at 10:20 AM  Please call the care guide team at [phone] if you need to cancel or reschedule your appointment.   If you are experiencing a Mental Health or [place] or need someone to talk to, please call 1-800-273-TALK (toll free, 24 hour hotline)   Following is a copy of your full care plan:  Care Plan : Robert Walters of Care  Updates made by Robert Logan, RN since 04/20/2021 12:00 AM     Problem: No plan established for management of chronic disease states (DMII, Essential Hypertension, Stage 3a CKD, Mixed Hyperlipidemia)   Priority: High     Long-Range Goal: Development of plan of care for chronic disease management DMII, Essential Hypertension, Stage 3a CKD, Mixed Hyperlipidemia   Start Date: 03/10/2021  Expected End Date: 03/10/2022  Recent Progress: On track  Priority: High  Note:   Current Barriers:  Knowledge Deficits related to plan of care for management of DMII, Essential Hypertension, Stage 3a CKD, Mixed Hyperlipidemia  Chronic Disease Management support and education needs related to DMII, Essential Hypertension, Stage 3a CKD, Mixed Hyperlipidemia   RNCM Clinical Goal(s):  Patient will verbalize basic understanding of  DMII, Essential Hypertension, Stage 3a CKD, Mixed Hyperlipidemia  disease process and self health management plan   take all medications exactly as prescribed and will call provider for medication related questions demonstrate Improved health management independence   continue to work with RN Care Manager to address care management and care coordination needs related to  DMII, Essential  Hypertension, Stage 3a CKD, Mixed Hyperlipidemia  will demonstrate ongoing self health care management ability    through collaboration with RN Care manager, provider, and care team.   Interventions: 1:1 collaboration with primary care provider regarding development and update of comprehensive plan of care as evidenced by provider attestation and co-signature Inter-disciplinary care team collaboration (see longitudinal plan of care) Evaluation of current treatment plan related to  self management and patient's adherence to plan as established by provider  Hyperlipidemia Interventions: (Status: Goal on track. Yes) Determined patient completed a follow up with Dr. Debara Pickett for evaluation and treatment of his Hyperlipidemia Review of patient status, including review of consultant's reports, relevant laboratory and other test results, and medications completed; Medication review performed; medication list updated in electronic medical record; Provider established cholesterol goals reviewed; Reviewed importance of limiting foods high in cholesterol; Reviewed exercise goals and target of 150 minutes per week; Discussed plans with patient for ongoing care management follow up and provided patient with direct contact information for care management team Lipid Panel     Component Value Date/Time   CHOL 227 (H) 03/25/2021 1040   TRIG 376 (H) 03/25/2021 1040   HDL 41 03/25/2021 1040   CHOLHDL 5.5 (H) 03/25/2021 1040   CHOLHDL 7.1 07/26/2015 0647   VLDL UNABLE TO CALCULATE IF TRIGLYCERIDE OVER 400 mg/dL 07/26/2015 0647   LDLCALC 120 (H) 03/25/2021 1040   LDLDIRECT 124.3 11/10/2010 0953   LABVLDL 66 (H) 03/25/2021 1040     Diabetes Interventions: (Status: Goal on track. Yes) Evaluation of current treatment plan related to Diabetes Mellitus self management  and patient's adherence to plan as established by provider      Assessed patient's understanding of A1c goal: <6.5% Provided education to patient  about basic DM disease process Reviewed medications with patient and discussed importance of medication adherence Counseled on importance of regular laboratory monitoring as prescribed Advised patient, providing education and rationale, to check cbg daily before meals  and record, calling PCP and or RN CM for findings outside established parameters Review of patient status, including review of consultants reports, relevant laboratory and other test results, and medications completed Re-educated on dietary and exercise recommendations  Discussed plans with patient for ongoing care management follow up and provided patient with direct contact information for care management team  Lab Results  Component Value Date   HGBA1C 6.7 (H) 04/14/2021     Chronic Kidney Disease Interventions:  (Status:  Goal on track:  Yes) Assessed the Patient understanding of chronic kidney disease    Evaluation of current treatment plan related to chronic kidney disease self management and patient's adherence to plan as established by provider      Reviewed prescribed diet increase daily water intake as directed Provided education on kidney disease progression    Engage patient in early, proactive and ongoing discussion about goals of care and what matters most to them    Discussed plans with patient for ongoing care management follow up and provided patient with direct contact information for care management team Last practice recorded BP readings:  BP Readings from Last 3 Encounters:  04/14/21 130/70  04/01/21 110/70  02/04/21 (!) 100/55  Most recent eGFR/CrCl:  Lab Results  Component Value Date   EGFR 54 (L) 04/14/2021    No components found for: CRCL   Patient Goals/Self-Care Activities: Take all medications as prescribed Attend all scheduled provider appointments Call pharmacy for medication refills 3-7 days in advance of running out of medications Perform all self care activities independently   Perform IADL's (shopping, preparing meals, housekeeping, managing finances) independently Call provider office for new concerns or questions  drink 6 to 8 glasses of water each day manage portion size take all medications exactly as prescribed adhere to prescribed diet: low fat, low Cholesterol  Follow Up Plan:  Telephone follow up appointment with care management team member scheduled for:  07/27/21      Consent to CCM Services: Robert Walters was given information about Chronic Care Management services including:  CCM service includes personalized support from designated clinical staff supervised by his physician, including individualized plan of care and coordination with other care providers 24/7 contact phone numbers for assistance for urgent and routine care needs. Service will only be billed when office clinical staff spend 20 minutes or more in a month to coordinate care. Only one practitioner may furnish and bill the service in a calendar month. The patient may stop CCM services at any time (effective at the end of the month) by phone call to the office staff. The patient will be responsible for cost sharing (co-pay) of up to 20% of the service fee (after annual deductible is met).  Patient agreed to services and verbal consent obtained.   The patient verbalized understanding of instructions, educational materials, and care plan provided today and declined offer to receive copy of patient instructions, educational materials, and care plan.   Telephone follow up appointment with care management team member scheduled for: 07/27/21

## 2021-04-21 NOTE — Chronic Care Management (AMB) (Signed)
Chronic Care Management   CCM RN Visit Note  04/20/2021 Name: Robert Walters MRN: [id number] DOB: 01-04-1956  Subjective: Robert Walters is a 65 y.o. year old male who is a primary care patient of [name], [place]. The care management team was consulted for assistance with disease management and care coordination needs.    Engaged with patient by telephone for follow up visit in response to provider referral for case management and/or care coordination services.   Consent to Services:  The patient was given information about Chronic Care Management services, agreed to services, and gave verbal consent prior to initiation of services.  Please see initial visit note for detailed documentation.   Patient agreed to services and verbal consent obtained.   Assessment: Review of patient past medical history, allergies, medications, health status, including review of consultants reports, laboratory and other test data, was performed as part of comprehensive evaluation and provision of chronic care management services.   SDOH (Social Determinants of Health) assessments and interventions performed:  Yes, no acute challenges  CCM Care Plan  No Known Allergies  Outpatient Encounter Medications as of 04/20/2021  Medication Sig Note   alfuzosin (UROXATRAL) 10 MG 24 hr tablet Take 1 tablet (10 mg total) by mouth daily. (Patient taking differently: Take 10 mg by mouth in the morning and at bedtime.)    Ascorbic Acid (VITAMIN C) 500 MG CAPS Take 500 mg by mouth daily.     aspirin EC 81 MG tablet Take 1 tablet (81 mg total) by mouth daily.    atorvastatin (LIPITOR) 80 MG tablet Take 80 mg by mouth daily.    b complex vitamins tablet Take 1 tablet by mouth daily.     cholecalciferol (VITAMIN D3) 25 MCG (1000 UNIT) tablet Take 2,000 Units by mouth daily.    CINNAMON PO Take 2,000 mg by mouth daily.    Coenzyme Q10 (COQ10) 200 MG CAPS Take 200 mg by mouth daily.    empagliflozin (JARDIANCE) 25 MG  TABS tablet Take 1 tablet (25 mg total) by mouth daily before breakfast.    ezetimibe (ZETIA) 10 MG tablet Take 1 tablet (10 mg total) by mouth daily.    FLUoxetine (PROZAC) 40 MG capsule Take 1 capsule (40 mg total) by mouth every morning.    Garlic 440 MG CAPS Take 500 mg by mouth daily.    glucose blood test strip Use to check blood sugars daily 3 times daily  E11.65    insulin degludec (TRESIBA FLEXTOUCH) 100 UNIT/ML FlexTouch Pen Inject 50 Units into the skin daily. (Patient taking differently: Inject 55 Units into the skin daily.) 02/03/2021: Took 27 units last night   Insulin Pen Needle (PEN NEEDLES) 32G X 4 MM MISC Use as directed    lamoTRIgine (LAMICTAL) 150 MG tablet Take 1 tablet (150 mg total) by mouth 2 (two) times daily.    LORazepam (ATIVAN) 1 MG tablet TAKE 1 TABLET BY MOUTH THREE TIMES DAILY AS NEEDED FOR ANXIETY. GENERIC EQUIVALENT FOR ATIVAN.    Menthol-Methyl Salicylate (ARTHRITIS HOT) 10-15 % CREA Apply 1 application topically daily as needed (pain).    Menthol-Methyl Salicylate (ICY HOT) 34-74 % STCK Apply 1 application topically 3 (three) times daily as needed (pain).    metFORMIN (GLUCOPHAGE) 1000 MG tablet TAKE 1 TABLET BY MOUTH 2 TIMES DAILY WITH MEALS    metoprolol tartrate (LOPRESSOR) 25 MG tablet TAKE 1 TABLET BY MOUTH 2 TIMES DAILY. GENERIC EQUIVALENT FOR LOPRESSOR.    Multiple Vitamin (MULTIVITAMIN  WITH MINERALS) TABS tablet Take 1 tablet by mouth daily.    Omega-3 Fatty Acids (FISH OIL) 1000 MG CAPS Take 2,000 mg by mouth daily.    omeprazole (PRILOSEC) 40 MG capsule Take 1 capsule (40 mg total) by mouth daily.    Semaglutide (RYBELSUS) 14 MG TABS Take 14 mg by mouth daily at 6 (six) AM.    valsartan (DIOVAN) 320 MG tablet Take 1 tablet (320 mg total) by mouth daily.    No facility-administered encounter medications on file as of 04/20/2021.    Patient Active Problem List   Diagnosis Date Noted   NASH (nonalcoholic steatohepatitis) 11/20/2020   Dysphagia  11/20/2020   Chronic diastolic heart failure ([place]) 03/27/2019   Fatty liver 12/26/2018   Elevated LFTs 12/26/2018   History of colonic polyps 12/26/2018   Fall 08/28/2018   Rib pain on left side 08/28/2018   Anxiety 03/31/2018   Bipolar II disorder ([place]) 03/31/2018   Insomnia 03/31/2018   Type 2 diabetes mellitus without complication, without long-term current use of insulin ([place]) 03/24/2018   Bile leak 09/15/2016   Sepsis ([place]) 08/23/2016   Cholecystitis    Stroke ([place]) 07/25/2015   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) 44/31/5400   BPH (benign prostatic hyperplasia) 05/15/2012   GERD (gastroesophageal reflux disease) 05/15/2012   Obesity 05/15/2012   Bipolar 1 disorder ([place]) 05/15/2012   Essential hypertension 10/30/2010   Cardiovascular risk factor 10/30/2010   Pure hypercholesterolemia 12/05/2008    Conditions to be addressed/monitored: DMII, Essential Hypertension, Stage 3a CKD, Mixed Hyperlipidemia   Care Plan : RN Care Manager Plan of Care  Updates made by Lynne Logan, RN since 04/20/2021 12:00 AM     Problem: No plan established for management of chronic disease states (DMII, Essential Hypertension, Stage 3a CKD, Mixed Hyperlipidemia)   Priority: High     Long-Range Goal: Development of plan of care for chronic disease management DMII, Essential Hypertension, Stage 3a CKD, Mixed Hyperlipidemia   Start Date: 03/10/2021  Expected End Date: 03/10/2022  Recent Progress: On track  Priority: High  Note:   Current Barriers:  Knowledge Deficits related to plan of care for management of DMII, Essential Hypertension, Stage 3a CKD, Mixed Hyperlipidemia  Chronic Disease Management support and education needs related to DMII, Essential Hypertension, Stage 3a CKD, Mixed Hyperlipidemia   RNCM Clinical  Goal(s):  Patient will verbalize basic understanding of  DMII, Essential Hypertension, Stage 3a CKD, Mixed Hyperlipidemia  disease process and self health management plan   take all medications exactly as prescribed and will call provider for medication related questions demonstrate Improved health management independence   continue to work with RN Care Manager to address care management and care coordination needs related to  DMII, Essential Hypertension, Stage 3a CKD, Mixed Hyperlipidemia  will demonstrate ongoing self health care management ability    through collaboration with RN Care manager, provider, and care team.   Interventions: 1:1 collaboration with primary care provider regarding development and update of comprehensive plan of care as evidenced by provider attestation and co-signature Inter-disciplinary care team collaboration (see longitudinal plan of care) Evaluation of current treatment plan related to  self management and patient's adherence to plan as established by provider  Hyperlipidemia Interventions: (Status: Goal on track. Yes) Determined patient completed a follow up with Dr. Debara Pickett for  evaluation and treatment of his Hyperlipidemia Review of patient status, including review of consultant's reports, relevant laboratory and other test results, and medications completed; Medication review performed; medication list updated in electronic medical record; Provider established cholesterol goals reviewed; Reviewed importance of limiting foods high in cholesterol; Reviewed exercise goals and target of 150 minutes per week; Discussed plans with patient for ongoing care management follow up and provided patient with direct contact information for care management team Lipid Panel     Component Value Date/Time   CHOL 227 (H) 03/25/2021 1040   TRIG 376 (H) 03/25/2021 1040   HDL 41 03/25/2021 1040   CHOLHDL 5.5 (H) 03/25/2021 1040   CHOLHDL 7.1 07/26/2015 0647   VLDL UNABLE TO  CALCULATE IF TRIGLYCERIDE OVER 400 mg/dL 07/26/2015 0647   LDLCALC 120 (H) 03/25/2021 1040   LDLDIRECT 124.3 11/10/2010 0953   LABVLDL 66 (H) 03/25/2021 1040     Diabetes Interventions: (Status: Goal on track. Yes) Evaluation of current treatment plan related to Diabetes Mellitus self management and patient's adherence to plan as established by provider      Assessed patient's understanding of A1c goal: <6.5% Provided education to patient about basic DM disease process Reviewed medications with patient and discussed importance of medication adherence Counseled on importance of regular laboratory monitoring as prescribed Advised patient, providing education and rationale, to check cbg daily before meals  and record, calling PCP and or RN CM for findings outside established parameters Review of patient status, including review of consultants reports, relevant laboratory and other test results, and medications completed Re-educated on dietary and exercise recommendations  Discussed plans with patient for ongoing care management follow up and provided patient with direct contact information for care management team  Lab Results  Component Value Date   HGBA1C 6.7 (H) 04/14/2021     Chronic Kidney Disease Interventions:  (Status:  Goal on track:  Yes) Assessed the Patient understanding of chronic kidney disease    Evaluation of current treatment plan related to chronic kidney disease self management and patient's adherence to plan as established by provider      Reviewed prescribed diet increase daily water intake as directed Provided education on kidney disease progression    Engage patient in early, proactive and ongoing discussion about goals of care and what matters most to them    Discussed plans with patient for ongoing care management follow up and provided patient with direct contact information for care management team Last practice recorded BP readings:  BP Readings from Last 3  Encounters:  04/14/21 130/70  04/01/21 110/70  02/04/21 (!) 100/55  Most recent eGFR/CrCl:  Lab Results  Component Value Date   EGFR 54 (L) 04/14/2021    No components found for: CRCL   Patient Goals/Self-Care Activities: Take all medications as prescribed Attend all scheduled provider appointments Call pharmacy for medication refills 3-7 days in advance of running out of medications Perform all self care activities independently  Perform IADL's (shopping, preparing meals, housekeeping, managing finances) independently Call provider office for new concerns or questions  drink 6 to 8 glasses of water each day manage portion size take all medications exactly as prescribed adhere to prescribed diet: low fat, low Cholesterol  Follow Up Plan:  Telephone follow up appointment with care management team member scheduled for:  07/27/21     Plan:Telephone follow up appointment with care management team member scheduled for:  07/27/21  Barb Merino, RN, BSN, CCM Care Management Coordinator [place] [facility]  Direct Phone: [phone]

## 2021-04-22 ENCOUNTER — Telehealth: Payer: Self-pay

## 2021-04-22 NOTE — Progress Notes (Signed)
° ° °  Chronic Care Management Pharmacy Assistant   Name: HAYES CZAJA  MRN: [id number] DOB: 1955/08/30   Reason for Encounter: Rybelsus   Spoke with patient he was concerned that his Rybelsus from patient assistance program [employer] is delayed in delivery. Sent message to Octavio Manns CMA to call patient about delay in delivery of medication. Patient stated he could not afford medication out pocket. Voucher was sent for free medication last month. Sent [name] a  message regarding possible medication change.      [place] Pharmacist Assistant [phone]

## 2021-04-24 ENCOUNTER — Ambulatory Visit: Payer: Self-pay

## 2021-04-24 ENCOUNTER — Telehealth: Payer: Medicare Other

## 2021-04-24 DIAGNOSIS — E782 Mixed hyperlipidemia: Secondary | ICD-10-CM

## 2021-04-24 DIAGNOSIS — E119 Type 2 diabetes mellitus without complications: Secondary | ICD-10-CM

## 2021-04-24 DIAGNOSIS — I10 Essential (primary) hypertension: Secondary | ICD-10-CM

## 2021-04-24 DIAGNOSIS — N1831 Chronic kidney disease, stage 3a: Secondary | ICD-10-CM

## 2021-04-24 NOTE — Chronic Care Management (AMB) (Signed)
Chronic Care Management   CCM RN Visit Note  04/24/2021 Name: Robert Walters MRN: [id number] DOB: 12-26-1955  Subjective: Robert Walters is a 65 y.o. year old male who is a primary care patient of Robert Walters, [place]. The care management team was consulted for assistance with disease management and care coordination needs.    Collaboration with [name] Pharm D for  follow up regarding Rybelsus medication  in response to provider referral for case management and/or care coordination services.   Consent to Services:  The patient was given information about Chronic Care Management services, agreed to services, and gave verbal consent prior to initiation of services.  Please see initial visit note for detailed documentation.   Patient agreed to services and verbal consent obtained.   Assessment: Review of patient past medical history, allergies, medications, health status, including review of consultants reports, laboratory and other test data, was performed as part of comprehensive evaluation and provision of chronic care management services.   SDOH (Social Determinants of Health) assessments and interventions performed:    CCM Care Plan  No Known Allergies  Outpatient Encounter Medications as of 04/24/2021  Medication Sig Note   alfuzosin (UROXATRAL) 10 MG 24 hr tablet Take 1 tablet (10 mg total) by mouth daily. (Patient taking differently: Take 10 mg by mouth in the morning and at bedtime.)    Ascorbic Acid (VITAMIN C) 500 MG CAPS Take 500 mg by mouth daily.     aspirin EC 81 MG tablet Take 1 tablet (81 mg total) by mouth daily.    atorvastatin (LIPITOR) 80 MG tablet Take 80 mg by mouth daily.    b complex vitamins tablet Take 1 tablet by mouth daily.     cholecalciferol (VITAMIN D3) 25 MCG (1000 UNIT) tablet Take 2,000 Units by mouth daily.    CINNAMON PO Take 2,000 mg by mouth daily.    Coenzyme Q10 (COQ10) 200 MG CAPS Take 200 mg by mouth daily.    empagliflozin (JARDIANCE)  25 MG TABS tablet Take 1 tablet (25 mg total) by mouth daily before breakfast.    ezetimibe (ZETIA) 10 MG tablet Take 1 tablet (10 mg total) by mouth daily.    FLUoxetine (PROZAC) 40 MG capsule Take 1 capsule (40 mg total) by mouth every morning.    Garlic 383 MG CAPS Take 500 mg by mouth daily.    glucose blood test strip Use to check blood sugars daily 3 times daily  E11.65    insulin degludec (TRESIBA FLEXTOUCH) 100 UNIT/ML FlexTouch Pen Inject 50 Units into the skin daily. (Patient taking differently: Inject 55 Units into the skin daily.) 02/03/2021: Took 27 units last night   Insulin Pen Needle (PEN NEEDLES) 32G X 4 MM MISC Use as directed    lamoTRIgine (LAMICTAL) 150 MG tablet Take 1 tablet (150 mg total) by mouth 2 (two) times daily.    LORazepam (ATIVAN) 1 MG tablet TAKE 1 TABLET BY MOUTH THREE TIMES DAILY AS NEEDED FOR ANXIETY. GENERIC EQUIVALENT FOR ATIVAN.    Menthol-Methyl Salicylate (ARTHRITIS HOT) 10-15 % CREA Apply 1 application topically daily as needed (pain).    Menthol-Methyl Salicylate (ICY HOT) 81-84 % STCK Apply 1 application topically 3 (three) times daily as needed (pain).    metFORMIN (GLUCOPHAGE) 1000 MG tablet TAKE 1 TABLET BY MOUTH 2 TIMES DAILY WITH MEALS    metoprolol tartrate (LOPRESSOR) 25 MG tablet TAKE 1 TABLET BY MOUTH 2 TIMES DAILY. GENERIC EQUIVALENT FOR LOPRESSOR.    Multiple  Vitamin (MULTIVITAMIN WITH MINERALS) TABS tablet Take 1 tablet by mouth daily.    Omega-3 Fatty Acids (FISH OIL) 1000 MG CAPS Take 2,000 mg by mouth daily.    omeprazole (PRILOSEC) 40 MG capsule Take 1 capsule (40 mg total) by mouth daily.    Semaglutide (RYBELSUS) 14 MG TABS Take 14 mg by mouth daily at 6 (six) AM.    valsartan (DIOVAN) 320 MG tablet Take 1 tablet (320 mg total) by mouth daily.    No facility-administered encounter medications on file as of 04/24/2021.    Patient Active Problem List   Diagnosis Date Noted   NASH (nonalcoholic steatohepatitis) 11/20/2020   Dysphagia  11/20/2020   Chronic diastolic heart failure ([place]) 03/27/2019   Fatty liver 12/26/2018   Elevated LFTs 12/26/2018   History of colonic polyps 12/26/2018   Fall 08/28/2018   Rib pain on left side 08/28/2018   Anxiety 03/31/2018   Bipolar II disorder ([place]) 03/31/2018   Insomnia 03/31/2018   Type 2 diabetes mellitus without complication, without long-term current use of insulin ([place]) 03/24/2018   Bile leak 09/15/2016   Sepsis ([place]) 08/23/2016   Cholecystitis    Stroke ([place]) 07/25/2015   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 07/25/2015   Dizziness 08/09/2014   TIA (transient ischemic attack) 05/15/2012   Blurred vision 05/15/2012   Ataxia 05/15/2012   Diabetes mellitus type 2 with complications ([place]) 98/92/1194   BPH (benign prostatic hyperplasia) 05/15/2012   GERD (gastroesophageal reflux disease) 05/15/2012   Obesity 05/15/2012   Bipolar 1 disorder ([place]) 05/15/2012   Essential hypertension 10/30/2010   Cardiovascular risk factor 10/30/2010   Pure hypercholesterolemia 12/05/2008    Conditions to be addressed/monitored: DMII, Essential Hypertension, Stage 3a CKD, Mixed Hyperlipidemia   Care Plan : RN Care Manager Plan of Care  Updates made by Lynne Logan, RN since 04/24/2021 12:00 AM     Problem: No plan established for management of chronic disease states (DMII, Essential Hypertension, Stage 3a CKD, Mixed Hyperlipidemia)   Priority: High     Long-Range Goal: Development of plan of care for chronic disease management DMII, Essential Hypertension, Stage 3a CKD, Mixed Hyperlipidemia   Start Date: 03/10/2021  Expected End Date: 03/10/2022  Recent Progress: On track  Priority: High  Note:   Current Barriers:  Knowledge Deficits related to plan of care for management of DMII, Essential Hypertension, Stage 3a CKD, Mixed Hyperlipidemia  Chronic Disease Management support and education needs related to DMII, Essential Hypertension, Stage 3a CKD, Mixed Hyperlipidemia   RNCM Clinical  Goal(s):  Patient will verbalize basic understanding of  DMII, Essential Hypertension, Stage 3a CKD, Mixed Hyperlipidemia  disease process and self health management plan   take all medications exactly as prescribed and will call provider for medication related questions demonstrate Improved health management independence   continue to work with RN Care Manager to address care management and care coordination needs related to  DMII, Essential Hypertension, Stage 3a CKD, Mixed Hyperlipidemia  will demonstrate ongoing self health care management ability    through collaboration with RN Care manager, provider, and care team.   Interventions: 1:1 collaboration with primary care provider regarding development and update of comprehensive plan of care as evidenced by provider attestation and co-signature Inter-disciplinary care team collaboration (see longitudinal plan of care) Evaluation of current treatment plan related to  self management and patient's adherence to plan as established by provider  Hyperlipidemia Interventions: (Status: Goal on track. Yes) Determined patient completed a follow up with Dr.  Hilty for evaluation and treatment of his Hyperlipidemia Review of patient status, including review of consultant's reports, relevant laboratory and other test results, and medications completed; Medication review performed; medication list updated in electronic medical record; Provider established cholesterol goals reviewed; Reviewed importance of limiting foods high in cholesterol; Reviewed exercise goals and target of 150 minutes per week; Discussed plans with patient for ongoing care management follow up and provided patient with direct contact information for care management team Lipid Panel     Component Value Date/Time   CHOL 227 (H) 03/25/2021 1040   TRIG 376 (H) 03/25/2021 1040   HDL 41 03/25/2021 1040   CHOLHDL 5.5 (H) 03/25/2021 1040   CHOLHDL 7.1 07/26/2015 0647   VLDL UNABLE TO  CALCULATE IF TRIGLYCERIDE OVER 400 mg/dL 07/26/2015 0647   LDLCALC 120 (H) 03/25/2021 1040   LDLDIRECT 124.3 11/10/2010 0953   LABVLDL 66 (H) 03/25/2021 1040     Diabetes Interventions: (Status: Goal on track. Yes) Evaluation of current treatment plan related to Diabetes Mellitus self management and patient's adherence to plan as established by provider      Assessed patient's understanding of A1c goal: <6.5% Provided education to patient about basic DM disease process Reviewed medications with patient and discussed importance of medication adherence Counseled on importance of regular laboratory monitoring as prescribed Advised patient, providing education and rationale, to check cbg daily before meals  and record, calling PCP and or RN CM for findings outside established parameters Review of patient status, including review of consultants reports, relevant laboratory and other test results, and medications completed Re-educated on dietary and exercise recommendations  Discussed plans with patient for ongoing care management follow up and provided patient with direct contact information for care management team 04/24/21 Case Collaboration:  Received voice message from patient x 2 stating he is low on his Rybelsus and would like a call back from the embedded pharmacy team to advise  Sent in basket message to embedded Pharm D [name] notifying her of patient's voice message to state he will take his last Rybelsus tablet this week Determined per Coralyn Helling, the embedded Pharmacy team has been in contact with Mr. Bellanca and will return his call again to provide the details concerning his Rybelsus  Lab Results  Component Value Date   HGBA1C 6.7 (H) 04/14/2021     Chronic Kidney Disease Interventions:  (Status:  Goal on track:  Yes) Assessed the Patient understanding of chronic kidney disease    Evaluation of current treatment plan related to chronic kidney disease self management and  patient's adherence to plan as established by provider      Reviewed prescribed diet increase daily water intake as directed Provided education on kidney disease progression    Engage patient in early, proactive and ongoing discussion about goals of care and what matters most to them    Discussed plans with patient for ongoing care management follow up and provided patient with direct contact information for care management team Last practice recorded BP readings:  BP Readings from Last 3 Encounters:  04/14/21 130/70  04/01/21 110/70  02/04/21 (!) 100/55  Most recent eGFR/CrCl:  Lab Results  Component Value Date   EGFR 54 (L) 04/14/2021    No components found for: CRCL   Patient Goals/Self-Care Activities: Take all medications as prescribed Attend all scheduled provider appointments Call pharmacy for medication refills 3-7 days in advance of running out of medications Perform all self care activities independently  Perform IADL's (shopping, preparing meals,  housekeeping, managing finances) independently Call provider office for new concerns or questions  drink 6 to 8 glasses of water each day manage portion size take all medications exactly as prescribed adhere to prescribed diet: low fat, low Cholesterol  Follow Up Plan:  Telephone follow up appointment with care management team member scheduled for:  07/27/21      Plan:Telephone follow up appointment with care management team member scheduled for:  04/30/21  Barb Merino, RN, BSN, CCM Care Management Coordinator [place] [facility]  Direct Phone: [phone]

## 2021-04-29 ENCOUNTER — Telehealth: Payer: Self-pay

## 2021-04-29 NOTE — Chronic Care Management (AMB) (Addendum)
Chronic Care Management Pharmacy Assistant   Name: Robert Walters  MRN: [id number] DOB: Sep 29, 1955   Reason for Encounter: Medication Review/ PAP    Medications: Outpatient Encounter Medications as of 04/29/2021  Medication Sig Note   alfuzosin (UROXATRAL) 10 MG 24 hr tablet Take 1 tablet (10 mg total) by mouth daily. (Patient taking differently: Take 10 mg by mouth in the morning and at bedtime.)    Ascorbic Acid (VITAMIN C) 500 MG CAPS Take 500 mg by mouth daily.     aspirin EC 81 MG tablet Take 1 tablet (81 mg total) by mouth daily.    atorvastatin (LIPITOR) 80 MG tablet Take 80 mg by mouth daily.    b complex vitamins tablet Take 1 tablet by mouth daily.     cholecalciferol (VITAMIN D3) 25 MCG (1000 UNIT) tablet Take 2,000 Units by mouth daily.    CINNAMON PO Take 2,000 mg by mouth daily.    Coenzyme Q10 (COQ10) 200 MG CAPS Take 200 mg by mouth daily.    empagliflozin (JARDIANCE) 25 MG TABS tablet Take 1 tablet (25 mg total) by mouth daily before breakfast.    ezetimibe (ZETIA) 10 MG tablet Take 1 tablet (10 mg total) by mouth daily.    FLUoxetine (PROZAC) 40 MG capsule Take 1 capsule (40 mg total) by mouth every morning.    Garlic 329 MG CAPS Take 500 mg by mouth daily.    glucose blood test strip Use to check blood sugars daily 3 times daily  E11.65    insulin degludec (TRESIBA FLEXTOUCH) 100 UNIT/ML FlexTouch Pen Inject 50 Units into the skin daily. (Patient taking differently: Inject 55 Units into the skin daily.) 02/03/2021: Took 27 units last night   Insulin Pen Needle (PEN NEEDLES) 32G X 4 MM MISC Use as directed    lamoTRIgine (LAMICTAL) 150 MG tablet Take 1 tablet (150 mg total) by mouth 2 (two) times daily.    LORazepam (ATIVAN) 1 MG tablet TAKE 1 TABLET BY MOUTH THREE TIMES DAILY AS NEEDED FOR ANXIETY. GENERIC EQUIVALENT FOR ATIVAN.    Menthol-Methyl Salicylate (ARTHRITIS HOT) 10-15 % CREA Apply 1 application topically daily as needed (pain).    Menthol-Methyl  Salicylate (ICY HOT) 19-16 % STCK Apply 1 application topically 3 (three) times daily as needed (pain).    metFORMIN (GLUCOPHAGE) 1000 MG tablet TAKE 1 TABLET BY MOUTH 2 TIMES DAILY WITH MEALS    metoprolol tartrate (LOPRESSOR) 25 MG tablet TAKE 1 TABLET BY MOUTH 2 TIMES DAILY. GENERIC EQUIVALENT FOR LOPRESSOR.    Multiple Vitamin (MULTIVITAMIN WITH MINERALS) TABS tablet Take 1 tablet by mouth daily.    Omega-3 Fatty Acids (FISH OIL) 1000 MG CAPS Take 2,000 mg by mouth daily.    omeprazole (PRILOSEC) 40 MG capsule Take 1 capsule (40 mg total) by mouth daily.    Semaglutide (RYBELSUS) 14 MG TABS Take 14 mg by mouth daily at 6 (six) AM.    valsartan (DIOVAN) 320 MG tablet Take 1 tablet (320 mg total) by mouth daily.    No facility-administered encounter medications on file as of 04/29/2021.   04-29-2021: Patient hasn't received rybelsus 14 mg from [employer] patient assistance. [place] and was told a prescription was on file with no copay. Medication is being filled and informed patient to call pharmacy and confirm for delivery or pick up.  05-01-2021: Received a message from [name] CPP that patient's PAP was denied, hasn't received medication and apothecary was unable to fill.  Contacted pharmacy and spoke with the insurance specialist Vaughan Basta and was told there is a block on patient's insurance since he receives medication through patient assistance. Contacted [employer] and was informed that medication is still in process due to being returned and representative stated she couldn't find a denial. Patient isn't eligible for a free voucher due to receiving one this year. Patient was given samples. [name] CPP was updated. [place] started PA and PA was approved. Contacted apothecary and was told the insurance specialist is on the phone with patient's insurance to remove block to process and she will give the patient a call once complete.  [place]  Pharmacist Assistant [phone]

## 2021-04-30 ENCOUNTER — Telehealth: Payer: Medicare Other

## 2021-04-30 ENCOUNTER — Ambulatory Visit: Payer: Self-pay

## 2021-04-30 DIAGNOSIS — N1831 Chronic kidney disease, stage 3a: Secondary | ICD-10-CM

## 2021-04-30 DIAGNOSIS — I10 Essential (primary) hypertension: Secondary | ICD-10-CM

## 2021-04-30 DIAGNOSIS — E782 Mixed hyperlipidemia: Secondary | ICD-10-CM

## 2021-04-30 DIAGNOSIS — E119 Type 2 diabetes mellitus without complications: Secondary | ICD-10-CM

## 2021-04-30 NOTE — Patient Instructions (Signed)
Visit Information  Thank you for taking time to visit with me today. Please don't hesitate to contact me if I can be of assistance to you before our next scheduled telephone appointment.  Following are the goals we discussed today:  (Copy and paste patient goals from clinical care plan here)  Our next appointment is by telephone on 07/27/21 at 10:20 AM  Please call the care guide team at [phone] if you need to cancel or reschedule your appointment.   If you are experiencing a Mental Health or [place] or need someone to talk to, please call 1-800-273-TALK (toll free, 24 hour hotline)   The patient verbalized understanding of instructions, educational materials, and care plan provided today and declined offer to receive copy of patient instructions, educational materials, and care plan.    Barb Merino, RN, BSN, CCM Care Management Coordinator [place] [facility]  Direct Phone: [phone]

## 2021-04-30 NOTE — Telephone Encounter (Signed)
This encounter was created in error - please disregard.

## 2021-04-30 NOTE — Chronic Care Management (AMB) (Signed)
Chronic Care Management   CCM RN Visit Note  04/30/2021 Name: Robert Walters MRN: [id number] DOB: [birth year]  Subjective: Robert Walters is a 65 y.o. year old male who is a primary care patient of [name], [place]. The care management team was consulted for assistance with disease management and care coordination needs.    Engaged with patient by telephone for follow up visit in response to provider referral for case management and/or care coordination services.   Consent to Services:  The patient was given information about Chronic Care Management services, agreed to services, and gave verbal consent prior to initiation of services.  Please see initial visit note for detailed documentation.   Patient agreed to services and verbal consent obtained.   Assessment: Review of patient past medical history, allergies, medications, health status, including review of consultants reports, laboratory and other test data, was performed as part of comprehensive evaluation and provision of chronic care management services.   SDOH (Social Determinants of Health) assessments and interventions performed:  Yes, no acute challenges   CCM Care Plan  No Known Allergies  Outpatient Encounter Medications as of 04/30/2021  Medication Sig Note   alfuzosin (UROXATRAL) 10 MG 24 hr tablet Take 1 tablet (10 mg total) by mouth daily. (Patient taking differently: Take 10 mg by mouth in the morning and at bedtime.)    Ascorbic Acid (VITAMIN C) 500 MG CAPS Take 500 mg by mouth daily.     aspirin EC 81 MG tablet Take 1 tablet (81 mg total) by mouth daily.    atorvastatin (LIPITOR) 80 MG tablet Take 80 mg by mouth daily.    b complex vitamins tablet Take 1 tablet by mouth daily.     cholecalciferol (VITAMIN D3) 25 MCG (1000 UNIT) tablet Take 2,000 Units by mouth daily.    CINNAMON PO Take 2,000 mg by mouth daily.    Coenzyme Q10 (COQ10) 200 MG CAPS Take 200 mg by mouth daily.    empagliflozin (JARDIANCE) 25 MG  TABS tablet Take 1 tablet (25 mg total) by mouth daily before breakfast.    ezetimibe (ZETIA) 10 MG tablet Take 1 tablet (10 mg total) by mouth daily.    FLUoxetine (PROZAC) 40 MG capsule Take 1 capsule (40 mg total) by mouth every morning.    Garlic 287 MG CAPS Take 500 mg by mouth daily.    glucose blood test strip Use to check blood sugars daily 3 times daily  E11.65    insulin degludec (TRESIBA FLEXTOUCH) 100 UNIT/ML FlexTouch Pen Inject 50 Units into the skin daily. (Patient taking differently: Inject 55 Units into the skin daily.) 02/03/2021: Took 27 units last night   Insulin Pen Needle (PEN NEEDLES) 32G X 4 MM MISC Use as directed    lamoTRIgine (LAMICTAL) 150 MG tablet Take 1 tablet (150 mg total) by mouth 2 (two) times daily.    LORazepam (ATIVAN) 1 MG tablet TAKE 1 TABLET BY MOUTH THREE TIMES DAILY AS NEEDED FOR ANXIETY. GENERIC EQUIVALENT FOR ATIVAN.    Menthol-Methyl Salicylate (ARTHRITIS HOT) 10-15 % CREA Apply 1 application topically daily as needed (pain).    Menthol-Methyl Salicylate (ICY HOT) 68-11 % STCK Apply 1 application topically 3 (three) times daily as needed (pain).    metFORMIN (GLUCOPHAGE) 1000 MG tablet TAKE 1 TABLET BY MOUTH 2 TIMES DAILY WITH MEALS    metoprolol tartrate (LOPRESSOR) 25 MG tablet TAKE 1 TABLET BY MOUTH 2 TIMES DAILY. GENERIC EQUIVALENT FOR LOPRESSOR.    Multiple Vitamin (  MULTIVITAMIN WITH MINERALS) TABS tablet Take 1 tablet by mouth daily.    Omega-3 Fatty Acids (FISH OIL) 1000 MG CAPS Take 2,000 mg by mouth daily.    omeprazole (PRILOSEC) 40 MG capsule Take 1 capsule (40 mg total) by mouth daily.    Semaglutide (RYBELSUS) 14 MG TABS Take 14 mg by mouth daily at 6 (six) AM.    valsartan (DIOVAN) 320 MG tablet Take 1 tablet (320 mg total) by mouth daily.    No facility-administered encounter medications on file as of 04/30/2021.    Patient Active Problem List   Diagnosis Date Noted   NASH (nonalcoholic steatohepatitis) 11/20/2020   Dysphagia  11/20/2020   Chronic diastolic heart failure ([place]) 03/27/2019   Fatty liver 12/26/2018   Elevated LFTs 12/26/2018   History of colonic polyps 12/26/2018   Fall 08/28/2018   Rib pain on left side 08/28/2018   Anxiety 03/31/2018   Bipolar II disorder ([place]) 03/31/2018   Insomnia 03/31/2018   Type 2 diabetes mellitus without complication, without long-term current use of insulin ([place]) 03/24/2018   Bile leak 09/15/2016   Sepsis ([place]) 08/23/2016   Cholecystitis    Stroke ([place]) 07/25/2015   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 07/25/2015   Dizziness 08/09/2014   TIA (transient ischemic attack) 05/15/2012   Blurred vision 05/15/2012   Ataxia 05/15/2012   Diabetes mellitus type 2 with complications ([place]) 92/03/9416   BPH (benign prostatic hyperplasia) 05/15/2012   GERD (gastroesophageal reflux disease) 05/15/2012   Obesity 05/15/2012   Bipolar 1 disorder ([place]) 05/15/2012   Essential hypertension 10/30/2010   Cardiovascular risk factor 10/30/2010   Pure hypercholesterolemia 12/05/2008    Conditions to be addressed/monitored: DMII, Essential Hypertension, Stage 3a CKD, Mixed Hyperlipidemia   Care Plan : RN Care Manager Plan of Care  Updates made by Lynne Logan, RN since 04/30/2021 12:00 AM     Problem: No plan established for management of chronic disease states (DMII, Essential Hypertension, Stage 3a CKD, Mixed Hyperlipidemia)   Priority: High     Long-Range Goal: Development of plan of care for chronic disease management DMII, Essential Hypertension, Stage 3a CKD, Mixed Hyperlipidemia   Start Date: 03/10/2021  Expected End Date: 03/10/2022  Recent Progress: On track  Priority: High  Note:   Current Barriers:  Knowledge Deficits related to plan of care for management of DMII, Essential Hypertension, Stage 3a CKD, Mixed Hyperlipidemia  Chronic Disease Management support and education needs related to DMII, Essential Hypertension, Stage 3a CKD, Mixed Hyperlipidemia   RNCM Clinical  Goal(s):  Patient will verbalize basic understanding of  DMII, Essential Hypertension, Stage 3a CKD, Mixed Hyperlipidemia  disease process and self health management plan   take all medications exactly as prescribed and will call provider for medication related questions demonstrate Improved health management independence   continue to work with RN Care Manager to address care management and care coordination needs related to  DMII, Essential Hypertension, Stage 3a CKD, Mixed Hyperlipidemia  will demonstrate ongoing self health care management ability    through collaboration with RN Care manager, provider, and care team.   Interventions: 1:1 collaboration with primary care provider regarding development and update of comprehensive plan of care as evidenced by provider attestation and co-signature Inter-disciplinary care team collaboration (see longitudinal plan of care) Evaluation of current treatment plan related to  self management and patient's adherence to plan as established by provider  Hyperlipidemia Interventions: (Status:  Condition stable.  Not addressed this visit.) Determined patient completed a follow  up with Dr. Debara Pickett for evaluation and treatment of his Hyperlipidemia Review of patient status, including review of consultant's reports, relevant laboratory and other test results, and medications completed; Medication review performed; medication list updated in electronic medical record; Provider established cholesterol goals reviewed; Reviewed importance of limiting foods high in cholesterol; Reviewed exercise goals and target of 150 minutes per week; Discussed plans with patient for ongoing care management follow up and provided patient with direct contact information for care management team Lipid Panel     Component Value Date/Time   CHOL 227 (H) 03/25/2021 1040   TRIG 376 (H) 03/25/2021 1040   HDL 41 03/25/2021 1040   CHOLHDL 5.5 (H) 03/25/2021 1040   CHOLHDL 7.1  07/26/2015 0647   VLDL UNABLE TO CALCULATE IF TRIGLYCERIDE OVER 400 mg/dL 07/26/2015 0647   LDLCALC 120 (H) 03/25/2021 1040   LDLDIRECT 124.3 11/10/2010 0953   LABVLDL 66 (H) 03/25/2021 1040     Diabetes Interventions: (Status: Goal on track. Yes) Evaluation of current treatment plan related to Diabetes Mellitus self management and patient's adherence to plan as established by provider      Assessed patient's understanding of A1c goal: <6.5% Provided education to patient about basic DM disease process Reviewed medications with patient and discussed importance of medication adherence Counseled on importance of regular laboratory monitoring as prescribed Advised patient, providing education and rationale, to check cbg daily before meals  and record, calling PCP and or RN CM for findings outside established parameters Review of patient status, including review of consultants reports, relevant laboratory and other test results, and medications completed Re-educated on dietary and exercise recommendations  Discussed plans with patient for ongoing care management follow up and provided patient with direct contact information for care management team 04/24/21 Case Collaboration:  Received voice message from patient x 2 stating he is low on his Rybelsus and would like a call back from the embedded pharmacy team to advise  Sent in basket message to embedded Pharm D [name] notifying her of patient's voice message to state he will take his last Rybelsus tablet this week Determined per Coralyn Helling, the embedded Pharmacy team has been in contact with Mr. Mccravy and will return his call again to provide the details concerning his Rybelsus 04/30/21 completed successful outbound call with patient  Determined patient was unable to obtain his Rybelsus from [place] due to insurance would not approve Determined patient is out of Rybelsus and his blood sugars are running in the 200's Collaborated  with embedded Pharm D [name] concerning patient's elevated sugars and lack of Rybelsus Determined the office will provide samples, Rybelsus 3 mg to be taken once daily, patient instructed Discussed [facility] will request a PA from [facility] for a 30 day supply until patient receives his PAP shipment Updated patient regarding plan to obtain samples from the office today while awaiting PA approval from health plan for a 30 day supply of Rybelsus Informed patient of 30 day delay in shipping from the manufacturer for his PAP shipment Educated patient on the importance to closely monitoring his cbg's while on lower dose of Rybelsus, reduce carbs and increase water intake  Discussed plans with patient for ongoing care management follow up and provided patient with direct contact information for care management team  Lab Results  Component Value Date   HGBA1C 6.7 (H) 04/14/2021     Chronic Kidney Disease Interventions:  (Status:  Condition stable.  Not addressed this visit.) Assessed the Patient understanding of  chronic kidney disease    Evaluation of current treatment plan related to chronic kidney disease self management and patient's adherence to plan as established by provider      Reviewed prescribed diet increase daily water intake as directed Provided education on kidney disease progression    Engage patient in early, proactive and ongoing discussion about goals of care and what matters most to them    Discussed plans with patient for ongoing care management follow up and provided patient with direct contact information for care management team Last practice recorded BP readings:  BP Readings from Last 3 Encounters:  04/14/21 130/70  04/01/21 110/70  02/04/21 (!) 100/55  Most recent eGFR/CrCl:  Lab Results  Component Value Date   EGFR 54 (L) 04/14/2021    No components found for: CRCL   Patient Goals/Self-Care Activities: Take all medications as prescribed Attend all  scheduled provider appointments Call pharmacy for medication refills 3-7 days in advance of running out of medications Perform all self care activities independently  Perform IADL's (shopping, preparing meals, housekeeping, managing finances) independently Call provider office for new concerns or questions  drink 6 to 8 glasses of water each day manage portion size take all medications exactly as prescribed adhere to prescribed diet: low fat, low Cholesterol  Follow Up Plan:  Telephone follow up appointment with care management team member scheduled for:  07/27/21      Plan:Telephone follow up appointment with care management team member scheduled for:  07/27/21  Barb Merino, RN, BSN, CCM Care Management Coordinator [place] [facility]  Direct Phone: [phone]

## 2021-05-01 ENCOUNTER — Telehealth: Payer: Self-pay

## 2021-05-01 NOTE — Chronic Care Management (AMB) (Addendum)
05/01/2021- Prior Authorization created in CoverMyMeds for Rybelsus 14 mg, sent to plan, awaiting determination. PA approved- Effective from 05/01/2021 through 05/01/2022.  Pattricia Boss, [place] Pharmacist Assistant [phone]

## 2021-05-02 DIAGNOSIS — E782 Mixed hyperlipidemia: Secondary | ICD-10-CM | POA: Diagnosis not present

## 2021-05-02 DIAGNOSIS — I10 Essential (primary) hypertension: Secondary | ICD-10-CM | POA: Diagnosis not present

## 2021-05-02 DIAGNOSIS — E119 Type 2 diabetes mellitus without complications: Secondary | ICD-10-CM | POA: Diagnosis not present

## 2021-05-02 DIAGNOSIS — N1831 Chronic kidney disease, stage 3a: Secondary | ICD-10-CM

## 2021-05-06 ENCOUNTER — Telehealth: Payer: Medicare Other

## 2021-05-06 ENCOUNTER — Telehealth: Payer: Self-pay

## 2021-05-06 ENCOUNTER — Ambulatory Visit (INDEPENDENT_AMBULATORY_CARE_PROVIDER_SITE_OTHER): Payer: Medicare Other

## 2021-05-06 DIAGNOSIS — E119 Type 2 diabetes mellitus without complications: Secondary | ICD-10-CM

## 2021-05-06 DIAGNOSIS — E782 Mixed hyperlipidemia: Secondary | ICD-10-CM

## 2021-05-06 DIAGNOSIS — N1831 Chronic kidney disease, stage 3a: Secondary | ICD-10-CM

## 2021-05-06 DIAGNOSIS — I10 Essential (primary) hypertension: Secondary | ICD-10-CM

## 2021-05-06 NOTE — Telephone Encounter (Signed)
The pt was notified that his rybelsus is ready for pickup from the [employer] [facility] pt assitance program

## 2021-05-06 NOTE — Chronic Care Management (AMB) (Signed)
Chronic Care Management   CCM RN Visit Note  05/06/2021 Name: Robert Walters MRN: [id number] DOB: 01-Jul-1955  Subjective: Robert Walters is a 66 y.o. year old male who is a primary care patient of [name], [place]. The care management team was consulted for assistance with disease management and care coordination needs.    Collaboration with [name] Pharm D  in response to inbound call from patient needing pharmacy assistance with [facility].   Consent to Services:  The patient was given information about Chronic Care Management services, agreed to services, and gave verbal consent prior to initiation of services.  Please see initial visit note for detailed documentation.   Patient agreed to services and verbal consent obtained.   Assessment: Review of patient past medical history, allergies, medications, health status, including review of consultants reports, laboratory and other test data, was performed as part of comprehensive evaluation and provision of chronic care management services.   SDOH (Social Determinants of Health) assessments and interventions performed:    CCM Care Plan  No Known Allergies  Outpatient Encounter Medications as of 05/06/2021  Medication Sig Note   alfuzosin (UROXATRAL) 10 MG 24 hr tablet Take 1 tablet (10 mg total) by mouth daily. (Patient taking differently: Take 10 mg by mouth in the morning and at bedtime.)    Ascorbic Acid (VITAMIN C) 500 MG CAPS Take 500 mg by mouth daily.     aspirin EC 81 MG tablet Take 1 tablet (81 mg total) by mouth daily.    atorvastatin (LIPITOR) 80 MG tablet Take 80 mg by mouth daily.    b complex vitamins tablet Take 1 tablet by mouth daily.     cholecalciferol (VITAMIN D3) 25 MCG (1000 UNIT) tablet Take 2,000 Units by mouth daily.    CINNAMON PO Take 2,000 mg by mouth daily.    Coenzyme Q10 (COQ10) 200 MG CAPS Take 200 mg by mouth daily.    empagliflozin (JARDIANCE) 25 MG TABS tablet Take 1 tablet (25 mg total) by  mouth daily before breakfast.    ezetimibe (ZETIA) 10 MG tablet Take 1 tablet (10 mg total) by mouth daily.    FLUoxetine (PROZAC) 40 MG capsule Take 1 capsule (40 mg total) by mouth every morning.    Garlic 295 MG CAPS Take 500 mg by mouth daily.    glucose blood test strip Use to check blood sugars daily 3 times daily  E11.65    insulin degludec (TRESIBA FLEXTOUCH) 100 UNIT/ML FlexTouch Pen Inject 50 Units into the skin daily. (Patient taking differently: Inject 55 Units into the skin daily.) 02/03/2021: Took 27 units last night   Insulin Pen Needle (PEN NEEDLES) 32G X 4 MM MISC Use as directed    lamoTRIgine (LAMICTAL) 150 MG tablet Take 1 tablet (150 mg total) by mouth 2 (two) times daily.    LORazepam (ATIVAN) 1 MG tablet TAKE 1 TABLET BY MOUTH THREE TIMES DAILY AS NEEDED FOR ANXIETY. GENERIC EQUIVALENT FOR ATIVAN.    Menthol-Methyl Salicylate (ARTHRITIS HOT) 10-15 % CREA Apply 1 application topically daily as needed (pain).    Menthol-Methyl Salicylate (ICY HOT) 28-41 % STCK Apply 1 application topically 3 (three) times daily as needed (pain).    metFORMIN (GLUCOPHAGE) 1000 MG tablet TAKE 1 TABLET BY MOUTH 2 TIMES DAILY WITH MEALS    metoprolol tartrate (LOPRESSOR) 25 MG tablet TAKE 1 TABLET BY MOUTH 2 TIMES DAILY. GENERIC EQUIVALENT FOR LOPRESSOR.    Multiple Vitamin (MULTIVITAMIN WITH MINERALS) TABS tablet Take  1 tablet by mouth daily.    Omega-3 Fatty Acids (FISH OIL) 1000 MG CAPS Take 2,000 mg by mouth daily.    omeprazole (PRILOSEC) 40 MG capsule Take 1 capsule (40 mg total) by mouth daily.    Semaglutide (RYBELSUS) 14 MG TABS Take 14 mg by mouth daily at 6 (six) AM.    valsartan (DIOVAN) 320 MG tablet Take 1 tablet (320 mg total) by mouth daily.    No facility-administered encounter medications on file as of 05/06/2021.    Patient Active Problem List   Diagnosis Date Noted   NASH (nonalcoholic steatohepatitis) 11/20/2020   Dysphagia 11/20/2020   Chronic diastolic heart failure  ([place]) 03/27/2019   Fatty liver 12/26/2018   Elevated LFTs 12/26/2018   History of colonic polyps 12/26/2018   Fall 08/28/2018   Rib pain on left side 08/28/2018   Anxiety 03/31/2018   Bipolar II disorder ([place]) 03/31/2018   Insomnia 03/31/2018   Type 2 diabetes mellitus without complication, without long-term current use of insulin ([place]) 03/24/2018   Bile leak 09/15/2016   Sepsis ([place]) 08/23/2016   Cholecystitis    Stroke ([place]) 07/25/2015   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) 05/15/2012   Essential hypertension 10/30/2010   Cardiovascular risk factor 10/30/2010   Pure hypercholesterolemia 12/05/2008    Conditions to be addressed/monitored: DMII, Essential Hypertension, Stage 3a CKD, Mixed Hyperlipidemia   Care Plan : RN Care Manager Plan of Care  Updates made by Lynne Logan, RN since 05/06/2021 12:00 AM     Problem: No plan established for management of chronic disease states (DMII, Essential Hypertension, Stage 3a CKD, Mixed Hyperlipidemia)   Priority: High     Long-Range Goal: Development of plan of care for chronic disease management DMII, Essential Hypertension, Stage 3a CKD, Mixed Hyperlipidemia   Start Date: 03/10/2021  Expected End Date: 03/10/2022  Recent Progress: On track  Priority: High  Note:   Current Barriers:  Knowledge Deficits related to plan of care for management of DMII, Essential Hypertension, Stage 3a CKD, Mixed Hyperlipidemia  Chronic Disease Management support and education needs related to DMII, Essential Hypertension, Stage 3a CKD, Mixed Hyperlipidemia   RNCM Clinical Goal(s):  Patient will verbalize basic understanding  of  DMII, Essential Hypertension, Stage 3a CKD, Mixed Hyperlipidemia  disease process and self health management plan   take all medications exactly as prescribed and will call provider for medication related questions demonstrate Improved health management independence   continue to work with RN Care Manager to address care management and care coordination needs related to  DMII, Essential Hypertension, Stage 3a CKD, Mixed Hyperlipidemia  will demonstrate ongoing self health care management ability    through collaboration with RN Care manager, provider, and care team.   Interventions: 1:1 collaboration with primary care provider regarding development and update of comprehensive plan of care as evidenced by provider attestation and co-signature Inter-disciplinary care team collaboration (see longitudinal plan of care) Evaluation of current treatment plan related to  self management and patient's adherence to plan as established by provider  Hyperlipidemia Interventions: (Status:  Condition stable.  Not addressed this visit.) Determined patient completed a follow up with Dr. Debara Pickett for evaluation  and treatment of his Hyperlipidemia Review of patient status, including review of consultant's reports, relevant laboratory and other test results, and medications completed; Medication review performed; medication list updated in electronic medical record; Provider established cholesterol goals reviewed; Reviewed importance of limiting foods high in cholesterol; Reviewed exercise goals and target of 150 minutes per week; Discussed plans with patient for ongoing care management follow up and provided patient with direct contact information for care management team Lipid Panel     Component Value Date/Time   CHOL 227 (H) 03/25/2021 1040   TRIG 376 (H) 03/25/2021 1040   HDL 41 03/25/2021 1040   CHOLHDL 5.5 (H) 03/25/2021 1040   CHOLHDL 7.1 07/26/2015 0647   VLDL UNABLE TO CALCULATE IF TRIGLYCERIDE  OVER 400 mg/dL 07/26/2015 0647   LDLCALC 120 (H) 03/25/2021 1040   LDLDIRECT 124.3 11/10/2010 0953   LABVLDL 66 (H) 03/25/2021 1040     Diabetes Interventions: (Status: Goal on track. Yes) Evaluation of current treatment plan related to Diabetes Mellitus self management and patient's adherence to plan as established by provider      Assessed patient's understanding of A1c goal: <6.5% Provided education to patient about basic DM disease process Reviewed medications with patient and discussed importance of medication adherence Counseled on importance of regular laboratory monitoring as prescribed Advised patient, providing education and rationale, to check cbg daily before meals  and record, calling PCP and or RN CM for findings outside established parameters Review of patient status, including review of consultants reports, relevant laboratory and other test results, and medications completed Re-educated on dietary and exercise recommendations  Discussed plans with patient for ongoing care management follow up and provided patient with direct contact information for care management team 04/24/21 Case Collaboration:  Received voice message from patient x 2 stating he is low on his Rybelsus and would like a call back from the embedded pharmacy team to advise  Sent in basket message to embedded Pharm D [name] notifying her of patient's voice message to state he will take his last Rybelsus tablet this week Determined per Coralyn Helling, the embedded Pharmacy team has been in contact with Mr. Carlyon and will return his call again to provide the details concerning his Rybelsus 04/30/21 completed successful outbound call with patient  Determined patient was unable to obtain his Rybelsus from [place] due to insurance would not approve Determined patient is out of Rybelsus and his blood sugars are running in the 200's Collaborated with embedded Pharm D [name] concerning patient's  elevated sugars and lack of Rybelsus Determined the office will provide samples, Rybelsus 3 mg to be taken once daily, patient instructed Discussed [facility] will request a PA from [facility] for a 30 day supply until patient receives his PAP shipment Updated patient regarding plan to obtain samples from the office today while awaiting PA approval from health plan for a 30 day supply of Rybelsus Informed patient of 30 day delay in shipping from the manufacturer for his PAP shipment Educated patient on the importance to closely monitoring his cbg's while on lower dose of Rybelsus, reduce carbs and increase water intake  Discussed plans with patient for ongoing care management follow up and provided patient with direct contact information for care management team 05/06/21  Case Collaboration  Voice message received from patient stating he was unable to refill his [name] and was given a phone number to call for more information, he is requesting assistance Collaborated with embedded Pharm D [name] regarding patient's  inbound call and request for assistance Advised patient reports having approximately 20 tablets remaining prior to running out of medication Provided phone number to Mordecai Rasmussen left by patient [phone], reply received from [facility] advising a pharmacy team member will reach out to Mr. Kitamura to further assist  Lab Results  Component Value Date   HGBA1C 6.7 (H) 04/14/2021     Chronic Kidney Disease Interventions:  (Status:  Condition stable.  Not addressed this visit.) Assessed the Patient understanding of chronic kidney disease    Evaluation of current treatment plan related to chronic kidney disease self management and patient's adherence to plan as established by provider      Reviewed prescribed diet increase daily water intake as directed Provided education on kidney disease progression    Engage patient in early, proactive and ongoing discussion about  goals of care and what matters most to them    Discussed plans with patient for ongoing care management follow up and provided patient with direct contact information for care management team Last practice recorded BP readings:  BP Readings from Last 3 Encounters:  04/14/21 130/70  04/01/21 110/70  02/04/21 (!) 100/55  Most recent eGFR/CrCl:  Lab Results  Component Value Date   EGFR 54 (L) 04/14/2021    No components found for: CRCL   Patient Goals/Self-Care Activities: Take all medications as prescribed Attend all scheduled provider appointments Call pharmacy for medication refills 3-7 days in advance of running out of medications Perform all self care activities independently  Perform IADL's (shopping, preparing meals, housekeeping, managing finances) independently Call provider office for new concerns or questions  drink 6 to 8 glasses of water each day manage portion size take all medications exactly as prescribed adhere to prescribed diet: low fat, low Cholesterol  Follow Up Plan:  Telephone follow up appointment with care management team member scheduled for:  05/20/21      Plan:Telephone follow up appointment with care management team member scheduled for:  05/20/21  Barb Merino, RN, BSN, CCM Care Management Coordinator [facility]  Direct Phone: [phone]

## 2021-05-12 ENCOUNTER — Other Ambulatory Visit: Payer: Self-pay

## 2021-05-12 ENCOUNTER — Other Ambulatory Visit: Payer: Self-pay | Admitting: Physician Assistant

## 2021-05-12 ENCOUNTER — Ambulatory Visit: Payer: Self-pay

## 2021-05-12 ENCOUNTER — Telehealth: Payer: Self-pay

## 2021-05-12 ENCOUNTER — Telehealth: Payer: Medicare Other

## 2021-05-12 DIAGNOSIS — I10 Essential (primary) hypertension: Secondary | ICD-10-CM

## 2021-05-12 DIAGNOSIS — E782 Mixed hyperlipidemia: Secondary | ICD-10-CM

## 2021-05-12 DIAGNOSIS — N1831 Chronic kidney disease, stage 3a: Secondary | ICD-10-CM

## 2021-05-12 DIAGNOSIS — E119 Type 2 diabetes mellitus without complications: Secondary | ICD-10-CM

## 2021-05-12 MED ORDER — METOPROLOL TARTRATE 25 MG PO TABS: ORAL_TABLET | ORAL | 0 refills | Status: DC

## 2021-05-12 NOTE — Chronic Care Management (AMB) (Signed)
Chronic Care Management   CCM RN Visit Note  05/12/2021 Name: ANTE Walters MRN: [id number] DOB: Feb 10, 1956  Subjective: [name] is a 66 y.o. year old male who is a primary care patient of Robert Walters, Robert Walters. The care management team was consulted for assistance with disease management and care coordination needs.    Engaged with patient by telephone for follow up visit in response to provider referral for case management and/or care coordination services.   Consent to Services:  The patient was given information about Chronic Care Management services, agreed to services, and gave verbal consent prior to initiation of services.  Please see initial visit note for detailed documentation.   Patient agreed to services and verbal consent obtained.   Assessment: Review of patient past medical history, allergies, medications, health status, including review of consultants reports, laboratory and other test data, was performed as part of comprehensive evaluation and provision of chronic care management services.   SDOH (Social Determinants of Health) assessments and interventions performed:  Yes, no acute changes   CCM Care Plan  No Known Allergies  Outpatient Encounter Medications as of 05/12/2021  Medication Sig Note   alfuzosin (UROXATRAL) 10 MG 24 hr tablet Take 1 tablet (10 mg total) by mouth daily. (Patient taking differently: Take 10 mg by mouth in the morning and at bedtime.)    Ascorbic Acid (VITAMIN C) 500 MG CAPS Take 500 mg by mouth daily.     aspirin EC 81 MG tablet Take 1 tablet (81 mg total) by mouth daily.    atorvastatin (LIPITOR) 80 MG tablet Take 80 mg by mouth daily.    b complex vitamins tablet Take 1 tablet by mouth daily.     cholecalciferol (VITAMIN D3) 25 MCG (1000 UNIT) tablet Take 2,000 Units by mouth daily.    CINNAMON PO Take 2,000 mg by mouth daily.    Coenzyme Q10 (COQ10) 200 MG CAPS Take 200 mg by mouth daily.    empagliflozin (JARDIANCE) 25 MG TABS  tablet Take 1 tablet (25 mg total) by mouth daily before breakfast.    ezetimibe (ZETIA) 10 MG tablet Take 1 tablet (10 mg total) by mouth daily.    FLUoxetine (PROZAC) 40 MG capsule TAKE 1 CAPSULE( 40MG TOTAL) BY MOUTH EVERY MORNING    Garlic 325 MG CAPS Take 500 mg by mouth daily.    glucose blood test strip Use to check blood sugars daily 3 times daily  E11.65    insulin degludec (TRESIBA FLEXTOUCH) 100 UNIT/ML FlexTouch Pen Inject 50 Units into the skin daily. (Patient taking differently: Inject 55 Units into the skin daily.) 02/03/2021: Took 27 units last night   Insulin Pen Needle (PEN NEEDLES) 32G X 4 MM MISC Use as directed    lamoTRIgine (LAMICTAL) 150 MG tablet Take 1 tablet (150 mg total) by mouth 2 (two) times daily.    LORazepam (ATIVAN) 1 MG tablet TAKE 1 TABLET BY MOUTH THREE TIMES DAILY AS NEEDED FOR ANXIETY. GENERIC EQUIVALENT FOR ATIVAN.    Menthol-Methyl Salicylate (ARTHRITIS HOT) 10-15 % CREA Apply 1 application topically daily as needed (pain).    Menthol-Methyl Salicylate (ICY HOT) 49-82 % STCK Apply 1 application topically 3 (three) times daily as needed (pain).    metFORMIN (GLUCOPHAGE) 1000 MG tablet TAKE 1 TABLET BY MOUTH 2 TIMES DAILY WITH MEALS    metoprolol tartrate (LOPRESSOR) 25 MG tablet TAKE 1 TABLET BY MOUTH 2 TIMES DAILY. GENERIC EQUIVALENT FOR LOPRESSOR. Please make overdue appt with Dr.  Nahser before anymore refills. Thank you 2nd attempt    Multiple Vitamin (MULTIVITAMIN WITH MINERALS) TABS tablet Take 1 tablet by mouth daily.    Omega-3 Fatty Acids (FISH OIL) 1000 MG CAPS Take 2,000 mg by mouth daily.    omeprazole (PRILOSEC) 40 MG capsule Take 1 capsule (40 mg total) by mouth daily.    Semaglutide (RYBELSUS) 14 MG TABS Take 14 mg by mouth daily at 6 (six) AM.    valsartan (DIOVAN) 320 MG tablet Take 1 tablet (320 mg total) by mouth daily.    No facility-administered encounter medications on file as of 05/12/2021.    Patient Active Problem List   Diagnosis  Date Noted   NASH (nonalcoholic steatohepatitis) 11/20/2020   Dysphagia 11/20/2020   Chronic diastolic heart failure ([place]) 03/27/2019   Fatty liver 12/26/2018   Elevated LFTs 12/26/2018   History of colonic polyps 12/26/2018   Fall 08/28/2018   Rib pain on left side 08/28/2018   Anxiety 03/31/2018   Bipolar II disorder ([place]) 03/31/2018   Insomnia 03/31/2018   Type 2 diabetes mellitus without complication, without long-term current use of insulin ([place]) 03/24/2018   Bile leak 09/15/2016   Sepsis (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 07/25/2015   Dizziness 08/09/2014   TIA (transient ischemic attack) 05/15/2012   Blurred vision 05/15/2012   Ataxia 05/15/2012   Diabetes mellitus type 2 with complications ([place]) 32/54/9826   BPH (benign prostatic hyperplasia) 05/15/2012   GERD (gastroesophageal reflux disease) 05/15/2012   Obesity 05/15/2012   Bipolar 1 disorder ([place]) 05/15/2012   Essential hypertension 10/30/2010   Cardiovascular risk factor 10/30/2010   Pure hypercholesterolemia 12/05/2008    Conditions to be addressed/monitored: DMII, Essential Hypertension, Stage 3a CKD, Mixed Hyperlipidemia   Care Plan : RN Care Manager Plan of Care  Updates made by Lynne Logan, RN since 05/12/2021 12:00 AM     Problem: No plan established for management of chronic disease states (DMII, Essential Hypertension, Stage 3a CKD, Mixed Hyperlipidemia)   Priority: High     Long-Range Goal: Development of plan of care for chronic disease management DMII, Essential Hypertension, Stage 3a CKD, Mixed Hyperlipidemia   Start Date: 03/10/2021  Expected End Date: 03/10/2022  Recent Progress: On track  Priority: High  Note:   Current Barriers:  Knowledge Deficits related to plan of care for management of DMII, Essential Hypertension, Stage 3a CKD, Mixed Hyperlipidemia  Chronic Disease Management support and education needs related to DMII, Essential  Hypertension, Stage 3a CKD, Mixed Hyperlipidemia   RNCM Clinical Goal(s):  Patient will verbalize basic understanding of  DMII, Essential Hypertension, Stage 3a CKD, Mixed Hyperlipidemia  disease process and self health management plan   take all medications exactly as prescribed and will call provider for medication related questions demonstrate Improved health management independence   continue to work with RN Care Manager to address care management and care coordination needs related to  DMII, Essential Hypertension, Stage 3a CKD, Mixed Hyperlipidemia  will demonstrate ongoing self health care management ability    through collaboration with RN Care manager, provider, and care team.   Interventions: 1:1 collaboration with primary care provider regarding development and update of comprehensive plan of care as evidenced by provider attestation and co-signature Inter-disciplinary care team collaboration (see longitudinal plan of care) Evaluation of current treatment plan related to  self management and patient's adherence to plan as established by provider  Hyperlipidemia Interventions: (Status:  Condition stable.  Not addressed this visit.) Determined patient completed a follow up with Dr. Debara Pickett for evaluation and treatment of his Hyperlipidemia Review of patient status, including review of consultant's reports, relevant laboratory and other test results, and medications completed; Medication review performed; medication list updated in electronic medical record; Provider established cholesterol goals reviewed; Reviewed importance of limiting foods high in cholesterol; Reviewed exercise goals and target of 150 minutes per week; Discussed plans with patient for ongoing care management follow up and provided patient with direct contact information for care management team Lipid Panel     Component Value Date/Time   CHOL 227 (H) 03/25/2021 1040   TRIG 376 (H) 03/25/2021 1040   HDL 41  03/25/2021 1040   CHOLHDL 5.5 (H) 03/25/2021 1040   CHOLHDL 7.1 07/26/2015 0647   VLDL UNABLE TO CALCULATE IF TRIGLYCERIDE OVER 400 mg/dL 07/26/2015 0647   LDLCALC 120 (H) 03/25/2021 1040   LDLDIRECT 124.3 11/10/2010 0953   LABVLDL 66 (H) 03/25/2021 1040     Diabetes Interventions: (Status: Goal on track. Yes) Evaluation of current treatment plan related to Diabetes Mellitus self management and patient's adherence to plan as established by provider      Assessed patient's understanding of A1c goal: <6.5% Provided education to patient about basic DM disease process Reviewed medications with patient and discussed importance of medication adherence Counseled on importance of regular laboratory monitoring as prescribed Advised patient, providing education and rationale, to check cbg daily before meals  and record, calling PCP and or RN CM for findings outside established parameters Review of patient status, including review of consultants reports, relevant laboratory and other test results, and medications completed Re-educated on dietary and exercise recommendations  Discussed plans with patient for ongoing care management follow up and provided patient with direct contact information for care management team 04/24/21 Case Collaboration:  Received voice message from patient x 2 stating he is low on his Rybelsus and would like a call back from the embedded pharmacy team to advise  Sent in basket message to embedded Pharm D [name] notifying her of patient's voice message to state he will take his last Rybelsus tablet this week Determined per Coralyn Helling, the embedded Pharmacy team has been in contact with Mr. Lasecki and will return his call again to provide the details concerning his Rybelsus 04/30/21 completed successful outbound call with patient  Determined patient was unable to obtain his Rybelsus from [facility] due to insurance would not approve Determined patient is out of  Rybelsus and his blood sugars are running in the 200's Collaborated with embedded Pharm D [name] concerning patient's elevated sugars and lack of Rybelsus Determined the office will provide samples, Rybelsus 3 mg to be taken once daily, patient instructed Discussed [facility] will request a PA from [facility] for a 30 day supply until patient receives his PAP shipment Updated patient regarding plan to obtain samples from the office today while awaiting PA approval from health plan for a 30 day supply of Rybelsus Informed patient of 30 day delay in shipping from the manufacturer for his PAP shipment Educated patient on the importance to closely monitoring his cbg's while on lower dose of Rybelsus, reduce carbs and increase water intake  Discussed plans with patient for ongoing care management follow up and provided patient with direct contact information for care management team 05/06/21  Case Collaboration  Voice message received from patient stating he was unable to refill his [name] and was given a phone number  to call for more information, he is requesting assistance Collaborated with embedded Pharm D [name] regarding patient's inbound call and request for assistance Advised patient reports having approximately 20 tablets remaining prior to running out of medication Provided phone number to Mordecai Rasmussen left by patient [phone], reply received from [facility] advising a pharmacy team member will reach out to Mr. Catalano to further assist 05/12/21 completed successful outbound call with patient  Received voice message from patient, he was able to fill his Rybelsus and his Jardience Discussed patient will continue to receive his future shipments of these medications through PAP  Encouraged patient to continue to follow his prescribed DM treatment plan as directed and to keep PCP and CCM team well informed of status change or concerns Discussed plans with patient for  ongoing care management follow up and provided patient with direct contact information for care management team  Lab Results  Component Value Date   HGBA1C 6.7 (H) 04/14/2021     Chronic Kidney Disease Interventions:  (Status:  Condition stable.  Not addressed this visit.) Assessed the Patient understanding of chronic kidney disease    Evaluation of current treatment plan related to chronic kidney disease self management and patient's adherence to plan as established by provider      Reviewed prescribed diet increase daily water intake as directed Provided education on kidney disease progression    Engage patient in early, proactive and ongoing discussion about goals of care and what matters most to them    Discussed plans with patient for ongoing care management follow up and provided patient with direct contact information for care management team Last practice recorded BP readings:  BP Readings from Last 3 Encounters:  04/14/21 130/70  04/01/21 110/70  02/04/21 (!) 100/55  Most recent eGFR/CrCl:  Lab Results  Component Value Date   EGFR 54 (L) 04/14/2021    No components found for: CRCL   Patient Goals/Self-Care Activities: Take all medications as prescribed Attend all scheduled provider appointments Call pharmacy for medication refills 3-7 days in advance of running out of medications Perform all self care activities independently  Perform IADL's (shopping, preparing meals, housekeeping, managing finances) independently Call provider office for new concerns or questions  drink 6 to 8 glasses of water each day manage portion size take all medications exactly as prescribed adhere to prescribed diet: low fat, low Cholesterol  Follow Up Plan:  Telephone follow up appointment with care management team member scheduled for:  07/27/21      Plan:Telephone follow up appointment with care management team member scheduled for:  07/27/21  Barb Merino, RN, BSN, CCM Care Management  Coordinator [place] [facility]  Direct Phone: [phone]

## 2021-05-12 NOTE — Patient Instructions (Signed)
Visit Information  Thank you for taking time to visit with me today. Please don't hesitate to contact me if I can be of assistance to you before our next scheduled telephone appointment.  Following are the goals we discussed today:  (Copy and paste patient goals from clinical care plan here)  Our next appointment is by telephone on 07/27/21 at 10:20 AM  Please call the care guide team at [phone] if you need to cancel or reschedule your appointment.   If you are experiencing a Mental Health or [place] or need someone to talk to, please call [phone]-TALK (toll free, 24 hour hotline)   The patient verbalized understanding of instructions, educational materials, and care plan provided today and declined offer to receive copy of patient instructions, educational materials, and care plan.    Barb Merino, RN, BSN, CCM Care Management Coordinator [place] [facility]  Direct Phone: [phone]

## 2021-05-12 NOTE — Chronic Care Management (AMB) (Signed)
Chronic Care Management Pharmacy Assistant   Name: Robert Walters  MRN: [id number] DOB: 12/25/1955   Reason for Encounter: Follow up on Jardiance    Medications: Outpatient Encounter Medications as of 05/12/2021  Medication Sig Note   alfuzosin (UROXATRAL) 10 MG 24 hr tablet Take 1 tablet (10 mg total) by mouth daily. (Patient taking differently: Take 10 mg by mouth in the morning and at bedtime.)    Ascorbic Acid (VITAMIN C) 500 MG CAPS Take 500 mg by mouth daily.     aspirin EC 81 MG tablet Take 1 tablet (81 mg total) by mouth daily.    atorvastatin (LIPITOR) 80 MG tablet Take 80 mg by mouth daily.    b complex vitamins tablet Take 1 tablet by mouth daily.     cholecalciferol (VITAMIN D3) 25 MCG (1000 UNIT) tablet Take 2,000 Units by mouth daily.    CINNAMON PO Take 2,000 mg by mouth daily.    Coenzyme Q10 (COQ10) 200 MG CAPS Take 200 mg by mouth daily.    empagliflozin (JARDIANCE) 25 MG TABS tablet Take 1 tablet (25 mg total) by mouth daily before breakfast.    ezetimibe (ZETIA) 10 MG tablet Take 1 tablet (10 mg total) by mouth daily.    FLUoxetine (PROZAC) 40 MG capsule TAKE 1 CAPSULE( 40MG  TOTAL) BY MOUTH EVERY MORNING    Garlic 440 MG CAPS Take 500 mg by mouth daily.    glucose blood test strip Use to check blood sugars daily 3 times daily  E11.65    insulin degludec (TRESIBA FLEXTOUCH) 100 UNIT/ML FlexTouch Pen Inject 50 Units into the skin daily. (Patient taking differently: Inject 55 Units into the skin daily.) 02/03/2021: Took 27 units last night   Insulin Pen Needle (PEN NEEDLES) 32G X 4 MM MISC Use as directed    lamoTRIgine (LAMICTAL) 150 MG tablet Take 1 tablet (150 mg total) by mouth 2 (two) times daily.    LORazepam (ATIVAN) 1 MG tablet TAKE 1 TABLET BY MOUTH THREE TIMES DAILY AS NEEDED FOR ANXIETY. GENERIC EQUIVALENT FOR ATIVAN.    Menthol-Methyl Salicylate (ARTHRITIS HOT) 10-15 % CREA Apply 1 application topically daily as needed (pain).    Menthol-Methyl Salicylate  (ICY HOT) 10-27 % STCK Apply 1 application topically 3 (three) times daily as needed (pain).    metFORMIN (GLUCOPHAGE) 1000 MG tablet TAKE 1 TABLET BY MOUTH 2 TIMES DAILY WITH MEALS    metoprolol tartrate (LOPRESSOR) 25 MG tablet TAKE 1 TABLET BY MOUTH 2 TIMES DAILY. GENERIC EQUIVALENT FOR LOPRESSOR. Please make overdue appt with [name] before anymore refills. Thank you 2nd attempt    Multiple Vitamin (MULTIVITAMIN WITH MINERALS) TABS tablet Take 1 tablet by mouth daily.    Omega-3 Fatty Acids (FISH OIL) 1000 MG CAPS Take 2,000 mg by mouth daily.    omeprazole (PRILOSEC) 40 MG capsule Take 1 capsule (40 mg total) by mouth daily.    Semaglutide (RYBELSUS) 14 MG TABS Take 14 mg by mouth daily at 6 (six) AM.    valsartan (DIOVAN) 320 MG tablet Take 1 tablet (320 mg total) by mouth daily.    No facility-administered encounter medications on file as of 05/12/2021.   05-10-2021: Received a message from [name] CPP stating " patient left voicemail stating he has 3 refills for Jardiance, however when he attempted to refill he was told it cannot be refilled. He provided a number given to him for someone to check on it....[phone]. He has approximately 20 tablets remaining  before running out. Was unable to get a rep from [facility] on the phone.  05-12-2021: Spoke with a rep at [facility] and was told patient has no refills left and [id number] application was never received. Informed Tamala PTM so she can refax application.  [place] Pharmacist Assistant [phone]

## 2021-05-19 ENCOUNTER — Ambulatory Visit: Payer: Medicare Other

## 2021-05-19 DIAGNOSIS — E119 Type 2 diabetes mellitus without complications: Secondary | ICD-10-CM

## 2021-05-19 DIAGNOSIS — E782 Mixed hyperlipidemia: Secondary | ICD-10-CM

## 2021-05-19 NOTE — Progress Notes (Signed)
Chronic Care Management Pharmacy Note  06/02/2021 Name:  Robert Walters MRN:  [id number] DOB:  Nov 30, 1955  Summary: Patient reports that he is taking his medication everyday at the same time each day.   Recommendations/Changes made from today's visit: Recommend that patient receive COVID-19 booster vaccine   Plan: Patient to get COVID-19 booster vaccine at [place]: Robert Walters is an 66 y.o. year old male who is a primary patient of Robert Walters, [place].  The CCM team was consulted for assistance with disease management and care coordination needs.    Engaged with patient by telephone for follow up visit in response to provider referral for pharmacy case management and/or care coordination services.   Consent to Services:  The patient was given information about Chronic Care Management services, agreed to services, and gave verbal consent prior to initiation of services.  Please see initial visit note for detailed documentation.   Patient Care Team: Robert Brine, FNP as PCP - General (General Practice) Nahser, Wonda Cheng, MD as PCP - Cardiology (Cardiology) Rex Kras Claudette Stapler, RN as Case Manager Caudill, Kennieth Francois, [employer] (Inactive) (Pharmacist)  Recent office visits: 04/14/2021 PCP OV  Recent consult visits: 04/01/2021 Cardiology [facility] visits: None in previous 6 months   Objective:  Lab Results  Component Value Date   CREATININE 1.42 (H) 04/14/2021   BUN 27 04/14/2021   GFRNONAA 50 (L) 03/26/2020   GFRAA 58 (L) 03/26/2020   NA 137 04/14/2021   K 4.7 04/14/2021   CALCIUM 9.1 04/14/2021   CO2 16 (L) 04/14/2021   GLUCOSE 177 (H) 04/14/2021    Lab Results  Component Value Date/Time   HGBA1C 6.7 (H) 04/14/2021 10:49 AM   HGBA1C 6.7 (H) 12/31/2020 10:57 AM   MICROALBUR 10 04/14/2021 11:45 AM   MICROALBUR 10 03/26/2020 01:16 PM    Last diabetic Eye exam:  Lab Results  Component Value Date/Time   HMDIABEYEEXA No Retinopathy  01/02/2020 12:00 AM    Last diabetic Foot exam: No results found for: HMDIABFOOTEX   Lab Results  Component Value Date   CHOL 227 (H) 03/25/2021   HDL 41 03/25/2021   LDLCALC 120 (H) 03/25/2021   LDLDIRECT 124.3 11/10/2010   TRIG 376 (H) 03/25/2021   CHOLHDL 5.5 (H) 03/25/2021    Hepatic Function Latest Ref Rng & Units 04/14/2021 12/31/2020 09/30/2020  Total Protein 6.0 - 8.5 g/dL 6.9 6.8 7.4  Albumin 3.8 - 4.8 g/dL 4.7 4.5 4.9(H)  AST 0 - 40 IU/L 36 34 42(H)  ALT 0 - 44 IU/L 51(H) 42 57(H)  Alk Phosphatase 44 - 121 IU/L 166(H) 155(H) 159(H)  Total Bilirubin 0.0 - 1.2 mg/dL 0.7 0.5 0.5  Bilirubin, Direct 0.0 - 0.2 mg/dL - - -    Lab Results  Component Value Date/Time   TSH 1.650 06/30/2020 03:50 PM   TSH 2.700 11/29/2018 10:46 AM   FREET4 1.69 03/24/2009 01:00 PM    CBC Latest Ref Rng & Units 04/14/2021 03/26/2020 11/29/2018  WBC 3.4 - 10.8 x10E3/uL 8.7 6.9 7.0  Hemoglobin 13.0 - 17.7 g/dL 14.6 13.5 12.8(L)  Hematocrit 37.5 - 51.0 % 43.4 40.2 37.6  Platelets 150 - 450 x10E3/uL 157 178 161    No results found for: VD25OH  Clinical ASCVD: Yes  The ASCVD Risk score (Arnett DK, et al., 2019) failed to calculate for the following reasons:   The patient has a prior MI or stroke diagnosis    Depression screen PHQ  2/9 04/14/2021 04/02/2021 12/31/2020  Decreased Interest 0 0 0  Down, Depressed, Hopeless 0 0 0  PHQ - 2 Score 0 0 0  Altered sleeping 0 - 0  Tired, decreased energy 0 - 0  Change in appetite 0 - 0  Feeling bad or failure about yourself  0 - 0  Trouble concentrating 0 - 0  Moving slowly or fidgety/restless 0 - 0  Suicidal thoughts 0 - 0  PHQ-9 Score 0 - 0  Difficult doing work/chores - - Not difficult at all  Some recent data might be hidden     Social History   Tobacco Use  Smoking Status Never  Smokeless Tobacco Never   BP Readings from Last 3 Encounters:  04/14/21 130/70  04/01/21 110/70  02/04/21 (!) 100/55   Pulse Readings from Last 3  Encounters:  04/14/21 87  04/01/21 (!) 109  02/04/21 100   Wt Readings from Last 3 Encounters:  04/14/21 237 lb 12.8 oz (107.9 kg)  04/02/21 238 lb (108 kg)  04/01/21 238 lb (108 kg)   BMI Readings from Last 3 Encounters:  04/14/21 35.32 kg/m  04/02/21 35.15 kg/m  04/01/21 35.15 kg/m    Assessment/Interventions: Review of patient past medical history, allergies, medications, health status, including review of consultants reports, laboratory and other test data, was performed as part of comprehensive evaluation and provision of chronic care management services.   SDOH:  (Social Determinants of Health) assessments and interventions performed: No  SDOH Screenings   Alcohol Screen: Not on file  Depression (PHQ2-9): Low Risk    PHQ-2 Score: 0  Financial Resource Strain: Low Risk    Difficulty of Paying Living Expenses: Not hard at all  Food Insecurity: No Food Insecurity   Worried About Charity fundraiser in the Last Year: Never true   Ran Out of Food in the Last Year: Never true  Housing: Not on file  Physical Activity: Inactive   Days of Exercise per Week: 0 days   Minutes of Exercise per Session: 0 min  Social Connections: Not on file  Stress: No Stress Concern Present   Feeling of Stress : Not at all  Tobacco Use: Low Risk    Smoking Tobacco Use: Never   Smokeless Tobacco Use: Never   Passive Exposure: Not on file  Transportation Needs: No Transportation Needs   Lack of Transportation (Medical): No   Lack of Transportation (Non-Medical): No    CCM Care Plan  No Known Allergies  Medications Reviewed Today     Reviewed by Lynne Logan, RN (Registered Nurse) on 05/20/21 at [phone]  Med List Status: <None>   Medication Order Taking? Sig Documenting Provider Last Dose Status Informant  alfuzosin (UROXATRAL) 10 MG 24 hr tablet [id number] No Take 1 tablet (10 mg total) by mouth daily.  Patient taking differently: Take 10 mg by mouth in the morning and at bedtime.    Franchot Gallo, MD Taking Active   Ascorbic Acid (VITAMIN C) 500 MG CAPS [id number] No Take 500 mg by mouth daily.  [provider] Taking Active Self  aspirin EC 81 MG tablet [id number] No Take 1 tablet (81 mg total) by mouth daily. Nahser, Wonda Cheng, MD Taking Active Self  atorvastatin (LIPITOR) 80 MG tablet [id number]  Take 80 mg by mouth daily. [provider]  Active   b complex vitamins tablet [id number] No Take 1 tablet by mouth daily.  [provider] Taking Active Self  cholecalciferol (VITAMIN D3) 25 MCG (  1000 UNIT) tablet [id number] No Take 2,000 Units by mouth daily. [provider] Taking Active Self  CINNAMON PO [id number] No Take 2,000 mg by mouth daily. [provider] Taking Active Self  Coenzyme Q10 (COQ10) 200 MG CAPS [id number] No Take 200 mg by mouth daily. [provider] Taking Active Self  empagliflozin (JARDIANCE) 25 MG TABS tablet [id number] No Take 1 tablet (25 mg total) by mouth daily before breakfast. Robert Brine, FNP Taking Active   ezetimibe (ZETIA) 10 MG tablet [id number]  Take 1 tablet (10 mg total) by mouth daily. Pixie Casino, MD  Active   FLUoxetine (PROZAC) 40 MG capsule [id number]  TAKE 1 CAPSULE( 40MG TOTAL) BY MOUTH EVERY MORNING Alen Blew  Active   Garlic 048 MG CAPS [id number] No Take 500 mg by mouth daily. [provider] Taking Active Self  glucose blood test strip [id number] No Use to check blood sugars daily 3 times daily  E11.65 Robert Brine, FNP Taking Active Self  insulin degludec (TRESIBA FLEXTOUCH) 100 UNIT/ML FlexTouch Pen [id number] No Inject 50 Units into the skin daily.  Patient taking differently: Inject 55 Units into the skin daily.   Robert Brine, FNP Taking Active            Med Note [facility], [place] Feb 03, 2021  6:44 AM) Took 27 units last night  Insulin Pen Needle (PEN NEEDLES) 32G X 4 MM MISC [id number] No Use as directed Robert Brine, FNP Taking Active Self   lamoTRIgine (LAMICTAL) 150 MG tablet [id number] No Take 1 tablet (150 mg total) by mouth 2 (two) times daily. Addison Lank, PA-C Taking Active Self  LORazepam (ATIVAN) 1 MG tablet [id number] No TAKE 1 TABLET BY MOUTH THREE TIMES DAILY AS NEEDED FOR ANXIETY. GENERIC EQUIVALENT FOR ATIVAN. Addison Lank, PA-C Taking Active Self  Menthol-Methyl Salicylate (ARTHRITIS HOT) 10-15 % CREA [id number] No Apply 1 application topically daily as needed (pain). [provider] Taking Active Self  Menthol-Methyl Salicylate (ICY HOT) 75-88 % STCK [id number] No Apply 1 application topically 3 (three) times daily as needed (pain). [provider] Taking Active Self  metFORMIN (GLUCOPHAGE) 1000 MG tablet [id number] No TAKE 1 TABLET BY MOUTH 2 TIMES DAILY WITH MEALS Robert Brine, FNP Taking Active Self  metoprolol tartrate (LOPRESSOR) 25 MG tablet [id number]  TAKE 1 TABLET BY MOUTH 2 TIMES DAILY. GENERIC EQUIVALENT FOR LOPRESSOR. Please make overdue appt with [name] before anymore refills. Thank you 2nd attempt Nahser, Wonda Cheng, MD  Active   Multiple Vitamin (MULTIVITAMIN WITH MINERALS) TABS tablet [id number] No Take 1 tablet by mouth daily. [provider] Taking Active Self  Omega-3 Fatty Acids (FISH OIL) 1000 MG CAPS [id number] No Take 2,000 mg by mouth daily. [provider] Taking Active Self  omeprazole (PRILOSEC) 40 MG capsule [id number] No Take 1 capsule (40 mg total) by mouth daily. Harvel Quale, MD Taking Active Self  Semaglutide (RYBELSUS) 14 MG TABS [id number] No Take 14 mg by mouth daily at 6 (six) AM. Robert Brine, FNP Taking Active   valsartan (DIOVAN) 320 MG tablet [id number] No Take 1 tablet (320 mg total) by mouth daily. Nahser, Wonda Cheng, MD Taking Active Self            Patient Active Problem List   Diagnosis Date Noted   NASH (nonalcoholic steatohepatitis) 11/20/2020   Dysphagia 11/20/2020   Chronic diastolic heart failure ([place]) 03/27/2019    Fatty liver 12/26/2018   Elevated  LFTs 12/26/2018   History of colonic polyps 12/26/2018   Fall 08/28/2018   Rib pain on left side 08/28/2018   Anxiety 03/31/2018   Bipolar II disorder ([place]) 03/31/2018   Insomnia 03/31/2018   Type 2 diabetes mellitus without complication, without long-term current use of insulin ([place]) 03/24/2018   Bile leak 09/15/2016   Sepsis ([place]) 08/23/2016   Cholecystitis    Stroke ([place]) 07/25/2015   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 07/25/2015   Dizziness 08/09/2014   TIA (transient ischemic attack) 05/15/2012   Blurred vision 05/15/2012   Ataxia 05/15/2012   Diabetes mellitus type 2 with complications ([place]) 37/90/2409   BPH (benign prostatic hyperplasia) 05/15/2012   GERD (gastroesophageal reflux disease) 05/15/2012   Obesity 05/15/2012   Bipolar 1 disorder ([place]) 05/15/2012   Essential hypertension 10/30/2010   Cardiovascular risk factor 10/30/2010   Pure hypercholesterolemia 12/05/2008    Immunization History  Administered Date(s) Administered   DTaP 10/05/2011   Influenza, High Dose Seasonal PF 01/15/2019, 12/31/2020   Influenza-Unspecified 03/03/2012, 03/16/2018, 03/21/2020   Moderna Sars-Covid-2 Vaccination 12/07/2019, 01/04/2020, 07/18/2020, 12/05/2020   Pneumococcal Conjugate-13 03/21/2019   Pneumococcal Polysaccharide-23 05/16/2012   Tdap 10/01/2020   Zoster Recombinat (Shingrix) 03/21/2019, 11/19/2019    Conditions to be addressed/monitored:  Hyperlipidemia and Diabetes  Care Plan : [place]  Updates made by Mayford Knife, RPH since 06/02/2021 12:00 AM     Problem: HLD, DM II   Priority: High     Long-Range Goal: Disease Management   Start Date: 01/14/2021  Recent Progress: On track  Priority: High  Note:   Current Barriers:  Unable to independently afford treatment regimen Unable to independently monitor therapeutic efficacy  Pharmacist Clinical Goal(s):  Patient will achieve adherence to monitoring guidelines and  medication adherence to achieve therapeutic efficacy through collaboration with PharmD and provider.   Interventions: 1:1 collaboration with Robert Brine, FNP regarding development and update of comprehensive plan of care as evidenced by provider attestation and co-signature Inter-disciplinary care team collaboration (see longitudinal plan of care) Comprehensive medication review performed; medication list updated in electronic medical record  Hyperlipidemia: (LDL goal < 70) -Not ideally controlled -Current treatment: Atorvastatin 80 mg tablet once per day  Zetia 10 mg tablet once per day  -Current dietary patterns: Patient reports eating [place] every day for breakfast that includes a sausage biscuit with egg and grits in the morning, patient reports that he does not cook. He does has a consistent routine in the morning so we discussed the other menu options available to him, including changing his bread to an [place] Muffin  -We discussed the cost of eating out and health.  -Current exercise habits:  rarely he sometimes walks at the shopping center  -Educated on Benefits of statin for ASCVD risk reduction; Importance of limiting foods high in cholesterol; -Recommended patient limit the fried and fatty foods  -Patient reports that he is going cut out the biscuit from his breakfast every morning  -We also discussed limiting his high carb meals and high fat meals because his triglycerides continue to trend upward and he is only eating fast food through out the day.  -We reviewed the amount of fat and sodium in his daily breakfast from [place] and the cost that he is paying per day at least 10 dollars on this meal alone, and options that he can choose that will be more healthy for him.  -We also discussed his evening frozen meals and different options around dinner.  -  Patient reports that the heart doctor recommended that he cut back on what he was eating as well.  -Recommended to  continue current medication Collaborated with patient and he agreed to add more frozen vegetables to his diet that he can easily microwave and still have a tasty treat.   Diabetes (A1c goal <7%) -Controlled -Current medications: Rybelsus 14 mg tablet once per day  Tresiba FlexTouch - inject 50 units into the skin daily  Jardiance 25 mg tablet daily -he had 20 pills left  Patient reports that he can not get them  -Current home glucose readings: will discuss during next visit  -Denies hypoglycemic/hyperglycemic symptoms -Current meal patterns:  breakfast:  eating [place] everyday for breakfast  dinner: a sandwich at 3 or 4pm from - tomato sandwich, or ham sandwich, or a salad, sometimes popcorn, eating a lot of beans  snacks: eating popcorn drinks: he has eliminated the amount of sugar in his drinks. He only puts a creamer -Current exercise: rarely he sometimes walks at the shopping center  -Educated on A1c and blood sugar goals; Complications of diabetes including kidney damage, retinal damage, and cardiovascular disease; -Patient reports that his bottle said he had 2 refills left for Jardiance but he was not able to fill it.  -Counseled to check feet daily and get yearly eye exams - We discussed in detail the processes and steps that he would be taking to get his medication.  -He knows that his patient assistance is still pending for Jardiance. He has 20 pills left currently, he counted with me while we were on the phone.  -He also knows that he will be able to pick up his medication if necessary from [place] [facility].  -He reported that he will be able to afford it because he has paid for it in the past with the pending approval. I am glad he feels comfortable reaching out to all of [place] and would like to continue to encourage him to also focus on changes that he can do at home to get towards his goals and stay healthy. We will continue to reach out to him as we have been at least  once a month if not more. I will send this through a secure message as well so that we do not lose track of this ongoing conversation. Thank You.  -Recommended to continue current medication  Patient Goals/Self-Care Activities Patient will:  - take medications as prescribed as evidenced by patient report and record review  Follow Up Plan: The patient has been provided with contact information for the care management team and has been advised to call with any health related questions or concerns.       Medication Assistance:  Jardiance and Ozempic obtained through [employer] and [employer]  medication assistance program.  Enrollment ends 04/2022  Compliance/Adherence/Medication fill history: Care Gaps: Ophthalmology  COVID-19 Booster - patient is going to schedule [employer] Drugs: Metformin 1000 mg tablet Atorvastatin 80 mg tablet Rybelsus 14 mg tablet Valsartan 320 mg tablet   Patient's preferred pharmacy is:  [facility] [place] [address] Phone: [phone] Fax: [place], [place] [address] Phone: [phone] Fax: [phone]  [place], [place] - [address] [place] [address] Phone: [phone] Fax:  [phone]  [id number] [place] [address] [place] [address]Phone: [phone]: [phone] Uses pill box? Yes Pt endorses 90% compliance  We discussed: Benefits of medication synchronization, packaging and delivery as well as enhanced pharmacist oversight with [employer]. Patient decided to: Continue current medication management strategy  Care Plan and Follow Up Patient Decision:  Patient agrees to Care Plan and Follow-up.  Plan: The  patient has been provided with contact information for the care management team and has been advised to call with any health related questions or concerns.   VOrlando Penner CPP, PharmD Clinical Pharmacist Practitioner [facility] [phone]

## 2021-05-20 ENCOUNTER — Telehealth: Payer: Medicare Other

## 2021-05-20 ENCOUNTER — Ambulatory Visit: Payer: Self-pay

## 2021-05-20 ENCOUNTER — Telehealth: Payer: Self-pay

## 2021-05-20 DIAGNOSIS — E119 Type 2 diabetes mellitus without complications: Secondary | ICD-10-CM

## 2021-05-20 DIAGNOSIS — E782 Mixed hyperlipidemia: Secondary | ICD-10-CM

## 2021-05-20 DIAGNOSIS — I10 Essential (primary) hypertension: Secondary | ICD-10-CM

## 2021-05-20 DIAGNOSIS — N1831 Chronic kidney disease, stage 3a: Secondary | ICD-10-CM

## 2021-05-20 NOTE — Chronic Care Management (AMB) (Signed)
Chronic Care Management   CCM RN Visit Note  05/19/2021 Name: Robert Walters MRN: [id number] DOB: [birth year]  Subjective: Robert Walters is a 66 y.o. year old male who is a primary care patient of [name], [place]. The care management team was consulted for assistance with disease management and care coordination needs.    Engaged with patient by telephone for follow up visit in response to provider referral for case management and/or care coordination services.   Consent to Services:  The patient was given information about Chronic Care Management services, agreed to services, and gave verbal consent prior to initiation of services.  Please see initial visit note for detailed documentation.   Patient agreed to services and verbal consent obtained.   Assessment: Review of patient past medical history, allergies, medications, health status, including review of consultants reports, laboratory and other test data, was performed as part of comprehensive evaluation and provision of chronic care management services.   SDOH (Social Determinants of Health) assessments and interventions performed:  Yes, no acute needs   CCM Care Plan  No Known Allergies  Outpatient Encounter Medications as of 05/20/2021  Medication Sig Note   alfuzosin (UROXATRAL) 10 MG 24 hr tablet Take 1 tablet (10 mg total) by mouth daily. (Patient taking differently: Take 10 mg by mouth in the morning and at bedtime.)    Ascorbic Acid (VITAMIN C) 500 MG CAPS Take 500 mg by mouth daily.     aspirin EC 81 MG tablet Take 1 tablet (81 mg total) by mouth daily.    atorvastatin (LIPITOR) 80 MG tablet Take 80 mg by mouth daily.    b complex vitamins tablet Take 1 tablet by mouth daily.     cholecalciferol (VITAMIN D3) 25 MCG (1000 UNIT) tablet Take 2,000 Units by mouth daily.    CINNAMON PO Take 2,000 mg by mouth daily.    Coenzyme Q10 (COQ10) 200 MG CAPS Take 200 mg by mouth daily.    empagliflozin (JARDIANCE) 25 MG TABS  tablet Take 1 tablet (25 mg total) by mouth daily before breakfast.    ezetimibe (ZETIA) 10 MG tablet Take 1 tablet (10 mg total) by mouth daily.    FLUoxetine (PROZAC) 40 MG capsule TAKE 1 CAPSULE( 40MG TOTAL) BY MOUTH EVERY MORNING    Garlic 016 MG CAPS Take 500 mg by mouth daily.    glucose blood test strip Use to check blood sugars daily 3 times daily  E11.65    insulin degludec (TRESIBA FLEXTOUCH) 100 UNIT/ML FlexTouch Pen Inject 50 Units into the skin daily. (Patient taking differently: Inject 55 Units into the skin daily.) 02/03/2021: Took 27 units last night   Insulin Pen Needle (PEN NEEDLES) 32G X 4 MM MISC Use as directed    lamoTRIgine (LAMICTAL) 150 MG tablet Take 1 tablet (150 mg total) by mouth 2 (two) times daily.    LORazepam (ATIVAN) 1 MG tablet TAKE 1 TABLET BY MOUTH THREE TIMES DAILY AS NEEDED FOR ANXIETY. GENERIC EQUIVALENT FOR ATIVAN.    Menthol-Methyl Salicylate (ARTHRITIS HOT) 10-15 % CREA Apply 1 application topically daily as needed (pain).    Menthol-Methyl Salicylate (ICY HOT) 01-09 % STCK Apply 1 application topically 3 (three) times daily as needed (pain).    metFORMIN (GLUCOPHAGE) 1000 MG tablet TAKE 1 TABLET BY MOUTH 2 TIMES DAILY WITH MEALS    metoprolol tartrate (LOPRESSOR) 25 MG tablet TAKE 1 TABLET BY MOUTH 2 TIMES DAILY. GENERIC EQUIVALENT FOR LOPRESSOR. Please make overdue appt with Dr.  Nahser before anymore refills. Thank you 2nd attempt    Multiple Vitamin (MULTIVITAMIN WITH MINERALS) TABS tablet Take 1 tablet by mouth daily.    Omega-3 Fatty Acids (FISH OIL) 1000 MG CAPS Take 2,000 mg by mouth daily.    omeprazole (PRILOSEC) 40 MG capsule Take 1 capsule (40 mg total) by mouth daily.    Semaglutide (RYBELSUS) 14 MG TABS Take 14 mg by mouth daily at 6 (six) AM.    valsartan (DIOVAN) 320 MG tablet Take 1 tablet (320 mg total) by mouth daily.    No facility-administered encounter medications on file as of 05/20/2021.    Patient Active Problem List   Diagnosis  Date Noted   NASH (nonalcoholic steatohepatitis) 11/20/2020   Dysphagia 11/20/2020   Chronic diastolic heart failure ([place]) 03/27/2019   Fatty liver 12/26/2018   Elevated LFTs 12/26/2018   History of colonic polyps 12/26/2018   Fall 08/28/2018   Rib pain on left side 08/28/2018   Anxiety 03/31/2018   Bipolar II disorder ([place]) 03/31/2018   Insomnia 03/31/2018   Type 2 diabetes mellitus without complication, without long-term current use of insulin ([place]) 03/24/2018   Bile leak 09/15/2016   Sepsis ([place]) 08/23/2016   Cholecystitis    Stroke ([place]) 07/25/2015   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 07/25/2015   Dizziness 08/09/2014   TIA (transient ischemic attack) 05/15/2012   Blurred vision 05/15/2012   Ataxia 05/15/2012   Diabetes mellitus type 2 with complications ([place]) 71/69/6789   BPH (benign prostatic hyperplasia) 05/15/2012   GERD (gastroesophageal reflux disease) 05/15/2012   Obesity 05/15/2012   Bipolar 1 disorder ([place]) 05/15/2012   Essential hypertension 10/30/2010   Cardiovascular risk factor 10/30/2010   Pure hypercholesterolemia 12/05/2008    Conditions to be addressed/monitored: DMII, Essential Hypertension, Stage 3a CKD, Mixed Hyperlipidemia   Care Plan : RN Care Manager Plan of Care  Updates made by Lynne Logan, RN since 05/19/2021 12:00 AM     Problem: No plan established for management of chronic disease states (DMII, Essential Hypertension, Stage 3a CKD, Mixed Hyperlipidemia)   Priority: High     Long-Range Goal: Development of plan of care for chronic disease management DMII, Essential Hypertension, Stage 3a CKD, Mixed Hyperlipidemia   Start Date: 03/10/2021  Expected End Date: 03/10/2022  Recent Progress: On track  Priority: High  Note:   Current Barriers:  Knowledge Deficits related to plan of care for management of DMII, Essential Hypertension, Stage 3a CKD, Mixed Hyperlipidemia  Chronic Disease Management support and education needs related to DMII, Essential  Hypertension, Stage 3a CKD, Mixed Hyperlipidemia   RNCM Clinical Goal(s):  Patient will verbalize basic understanding of  DMII, Essential Hypertension, Stage 3a CKD, Mixed Hyperlipidemia  disease process and self health management plan   take all medications exactly as prescribed and will call provider for medication related questions demonstrate Improved health management independence   continue to work with RN Care Manager to address care management and care coordination needs related to  DMII, Essential Hypertension, Stage 3a CKD, Mixed Hyperlipidemia  will demonstrate ongoing self health care management ability    through collaboration with RN Care manager, provider, and care team.   Interventions: 1:1 collaboration with primary care provider regarding development and update of comprehensive plan of care as evidenced by provider attestation and co-signature Inter-disciplinary care team collaboration (see longitudinal plan of care) Evaluation of current treatment plan related to  self management and patient's adherence to plan as established by provider  Hyperlipidemia Interventions: (Status:  Condition stable.  Not addressed this visit.) Determined patient completed a follow up with Dr. Debara Pickett for evaluation and treatment of his Hyperlipidemia Review of patient status, including review of consultant's reports, relevant laboratory and other test results, and medications completed; Medication review performed; medication list updated in electronic medical record; Provider established cholesterol goals reviewed; Reviewed importance of limiting foods high in cholesterol; Reviewed exercise goals and target of 150 minutes per week; Discussed plans with patient for ongoing care management follow up and provided patient with direct contact information for care management team Lipid Panel     Component Value Date/Time   CHOL 227 (H) 03/25/2021 1040   TRIG 376 (H) 03/25/2021 1040   HDL 41  03/25/2021 1040   CHOLHDL 5.5 (H) 03/25/2021 1040   CHOLHDL 7.1 07/26/2015 0647   VLDL UNABLE TO CALCULATE IF TRIGLYCERIDE OVER 400 mg/dL 07/26/2015 0647   LDLCALC 120 (H) 03/25/2021 1040   LDLDIRECT 124.3 11/10/2010 0953   LABVLDL 66 (H) 03/25/2021 1040     Diabetes Interventions: (Status: Goal on track. Yes) Evaluation of current treatment plan related to Diabetes Mellitus self management and patient's adherence to plan as established by provider      Assessed patient's understanding of A1c goal: <6.5% Provided education to patient about basic DM disease process Reviewed medications with patient and discussed importance of medication adherence Counseled on importance of regular laboratory monitoring as prescribed Advised patient, providing education and rationale, to check cbg daily before meals  and record, calling PCP and or RN CM for findings outside established parameters Review of patient status, including review of consultants reports, relevant laboratory and other test results, and medications completed Re-educated on dietary and exercise recommendations  Discussed plans with patient for ongoing care management follow up and provided patient with direct contact information for care management team 04/24/21 Case Collaboration:  Received voice message from patient x 2 stating he is low on his Rybelsus and would like a call back from the embedded pharmacy team to advise  Sent in basket message to embedded Pharm D [name] notifying her of patient's voice message to state he will take his last Rybelsus tablet this week Determined per Coralyn Helling, the embedded Pharmacy team has been in contact with Robert Walters and will return his call again to provide the details concerning his Rybelsus 04/30/21 completed successful outbound call with patient  Determined patient was unable to obtain his Rybelsus from [facility] due to insurance would not approve Determined patient is out of  Rybelsus and his blood sugars are running in the 200's Collaborated with embedded Pharm D [name] concerning patient's elevated sugars and lack of Rybelsus Determined the office will provide samples, Rybelsus 3 mg to be taken once daily, patient instructed Discussed [facility] will request a PA from [facility] for a 30 day supply until patient receives his PAP shipment Updated patient regarding plan to obtain samples from the office today while awaiting PA approval from health plan for a 30 day supply of Rybelsus Informed patient of 30 day delay in shipping from the manufacturer for his PAP shipment Educated patient on the importance to closely monitoring his cbg's while on lower dose of Rybelsus, reduce carbs and increase water intake  Discussed plans with patient for ongoing care management follow up and provided patient with direct contact information for care management team 05/06/21  Case Collaboration  Voice message received from patient stating he was unable to refill his [name] and was given a phone number  to call for more information, he is requesting assistance Collaborated with embedded Pharm D [name] regarding patient's inbound call and request for assistance Advised patient reports having approximately 20 tablets remaining prior to running out of medication Provided phone number to Mordecai Rasmussen left by patient [phone], reply received from [facility] advising a pharmacy team member will reach out to Robert Walters to further assist 05/12/21 completed successful outbound call with patient  Received voice message from patient, he was able to fill his Rybelsus and his Jardience Discussed patient will continue to receive his future shipments of these medications through PAP  Encouraged patient to continue to follow his prescribed DM treatment plan as directed and to keep PCP and CCM team well informed of status change or concerns Discussed plans with patient for  ongoing care management follow up and provided patient with direct contact information for care management team 05/18/21 Inbound call from patient Received a voice message from patient stating he needs help with getting a refill for Jardience Sent in basket message to embedded Pharm D [name] regarding patient's voice message  Reply received from [place], she has spoken with Robert Walters to advise on how he can refill his Jardience through local pharmacy if PAP shipment is further delayed, she further educated on dietary and exercise recommendations  Placed successful outbound call to patient  Reviewed and discussed how to obtain Jardience from local pharmacy if PAP shipment is further delayed Reinforced education to patient the importance of adherence to dietary and exercise recommendations  Discussed plans with patient for ongoing care management follow up and provided patient with direct contact information for care management team  Lab Results  Component Value Date   HGBA1C 6.7 (H) 04/14/2021     Chronic Kidney Disease Interventions:  (Status:  Condition stable.  Not addressed this visit.) Assessed the Patient understanding of chronic kidney disease    Evaluation of current treatment plan related to chronic kidney disease self management and patient's adherence to plan as established by provider      Reviewed prescribed diet increase daily water intake as directed Provided education on kidney disease progression    Engage patient in early, proactive and ongoing discussion about goals of care and what matters most to them    Discussed plans with patient for ongoing care management follow up and provided patient with direct contact information for care management team Last practice recorded BP readings:  BP Readings from Last 3 Encounters:  04/14/21 130/70  04/01/21 110/70  02/04/21 (!) 100/55  Most recent eGFR/CrCl:  Lab Results  Component Value Date   EGFR 54 (L) 04/14/2021    No  components found for: CRCL   Patient Goals/Self-Care Activities: Take all medications as prescribed Attend all scheduled provider appointments Call pharmacy for medication refills 3-7 days in advance of running out of medications Perform all self care activities independently  Perform IADL's (shopping, preparing meals, housekeeping, managing finances) independently Call provider office for new concerns or questions  drink 6 to 8 glasses of water each day manage portion size take all medications exactly as prescribed adhere to prescribed diet: low fat, low Cholesterol  Follow Up Plan:  Telephone follow up appointment with care management team member scheduled for:  07/27/21     Plan:Telephone follow up appointment with care management team member scheduled for:  07/27/21  Barb Merino, RN, BSN, CCM Care Management Coordinator [facility]  Direct Phone: [phone]

## 2021-05-20 NOTE — Progress Notes (Signed)
Chronic Care Management Pharmacy Assistant   Name: Robert Walters  MRN: [id number] DOB: 1956/04/03   Reason for Encounter: Disease State/ Diabetes Adherence Call   Recent office visits: None  Recent consult visits: None  None  Medications: Outpatient Encounter Medications as of 05/20/2021  Medication Sig Note   alfuzosin (UROXATRAL) 10 MG 24 hr tablet Take 1 tablet (10 mg total) by mouth daily. (Patient taking differently: Take 10 mg by mouth in the morning and at bedtime.)    Ascorbic Acid (VITAMIN C) 500 MG CAPS Take 500 mg by mouth daily.     aspirin EC 81 MG tablet Take 1 tablet (81 mg total) by mouth daily.    atorvastatin (LIPITOR) 80 MG tablet Take 80 mg by mouth daily.    b complex vitamins tablet Take 1 tablet by mouth daily.     cholecalciferol (VITAMIN D3) 25 MCG (1000 UNIT) tablet Take 2,000 Units by mouth daily.    CINNAMON PO Take 2,000 mg by mouth daily.    Coenzyme Q10 (COQ10) 200 MG CAPS Take 200 mg by mouth daily.    empagliflozin (JARDIANCE) 25 MG TABS tablet Take 1 tablet (25 mg total) by mouth daily before breakfast.    ezetimibe (ZETIA) 10 MG tablet Take 1 tablet (10 mg total) by mouth daily.    FLUoxetine (PROZAC) 40 MG capsule TAKE 1 CAPSULE( 40MG  TOTAL) BY MOUTH EVERY MORNING    Garlic 478 MG CAPS Take 500 mg by mouth daily.    glucose blood test strip Use to check blood sugars daily 3 times daily  E11.65    insulin degludec (TRESIBA FLEXTOUCH) 100 UNIT/ML FlexTouch Pen Inject 50 Units into the skin daily. (Patient taking differently: Inject 55 Units into the skin daily.) 02/03/2021: Took 27 units last night   Insulin Pen Needle (PEN NEEDLES) 32G X 4 MM MISC Use as directed    lamoTRIgine (LAMICTAL) 150 MG tablet Take 1 tablet (150 mg total) by mouth 2 (two) times daily.    LORazepam (ATIVAN) 1 MG tablet TAKE 1 TABLET BY MOUTH THREE TIMES DAILY AS NEEDED FOR ANXIETY. GENERIC EQUIVALENT FOR ATIVAN.    Menthol-Methyl Salicylate (ARTHRITIS HOT) 10-15 % CREA  Apply 1 application topically daily as needed (pain).    Menthol-Methyl Salicylate (ICY HOT) 29-56 % STCK Apply 1 application topically 3 (three) times daily as needed (pain).    metFORMIN (GLUCOPHAGE) 1000 MG tablet TAKE 1 TABLET BY MOUTH 2 TIMES DAILY WITH MEALS    metoprolol tartrate (LOPRESSOR) 25 MG tablet TAKE 1 TABLET BY MOUTH 2 TIMES DAILY. GENERIC EQUIVALENT FOR LOPRESSOR. Please make overdue appt with [name] before anymore refills. Thank you 2nd attempt    Multiple Vitamin (MULTIVITAMIN WITH MINERALS) TABS tablet Take 1 tablet by mouth daily.    Omega-3 Fatty Acids (FISH OIL) 1000 MG CAPS Take 2,000 mg by mouth daily.    omeprazole (PRILOSEC) 40 MG capsule Take 1 capsule (40 mg total) by mouth daily.    Semaglutide (RYBELSUS) 14 MG TABS Take 14 mg by mouth daily at 6 (six) AM.    valsartan (DIOVAN) 320 MG tablet Take 1 tablet (320 mg total) by mouth daily.    No facility-administered encounter medications on file as of 05/20/2021.   Recent Relevant Labs: Lab Results  Component Value Date/Time   HGBA1C 6.7 (H) 04/14/2021 10:49 AM   HGBA1C 6.7 (H) 12/31/2020 10:57 AM   MICROALBUR 10 04/14/2021 11:45 AM   MICROALBUR 10 03/26/2020 01:16 PM  Kidney Function Lab Results  Component Value Date/Time   CREATININE 1.42 (H) 04/14/2021 10:49 AM   CREATININE 1.50 (H) 12/31/2020 10:57 AM   CREATININE 1.33 (H) 10/19/2016 02:51 PM   GFRNONAA 50 (L) 03/26/2020 11:09 AM   GFRAA 58 (L) 03/26/2020 11:09 AM   Called patient 05/20/2021 left message. Called patient 05/21/2021 left message  Care Gaps:  Shingrix Vaccine - 11/19/2019, second dose of Shingrix Vaccine given at [facility]  Influenza Vaccine - at PCP office 12/31/2020 Opthalmology Exam- due for one    Star Rating Drugs:  Medication:  Last Fill: Day Supply  Atorvastatin 40 mg       04/28/2021          90 DS Valsartan 320 mg          05/13/2021         90 DS Semaglutide 14 mg        05/01/2021         90 DS Metformin  1000 mg        09/24/2020         90 DS [name]                          Patient Assistance Jardiance 25 mg            Patient Con[place] Clinical Pharmacist Assistant [phone]

## 2021-05-20 NOTE — Patient Instructions (Signed)
Visit Information  Thank you for taking time to visit with me today. Please don't hesitate to contact me if I can be of assistance to you before our next scheduled telephone appointment.  Following are the goals we discussed today:  (Copy and paste patient goals from clinical care plan here)  Our next appointment is by telephone on 07/27/21 at 10:20 AM   Please call the care guide team at [phone] if you need to cancel or reschedule your appointment.   If you are experiencing a Mental Health or [place] or need someone to talk to, please call [phone]-TALK (toll free, 24 hour hotline)   The patient verbalized understanding of instructions, educational materials, and care plan provided today and agreed to receive a mailed copy of patient instructions, educational materials, and care plan.   Barb Merino, RN, BSN, CCM Care Management Coordinator [facility]  Direct Phone: [phone]

## 2021-06-02 DIAGNOSIS — N1831 Chronic kidney disease, stage 3a: Secondary | ICD-10-CM

## 2021-06-02 DIAGNOSIS — I10 Essential (primary) hypertension: Secondary | ICD-10-CM

## 2021-06-02 DIAGNOSIS — E782 Mixed hyperlipidemia: Secondary | ICD-10-CM

## 2021-06-02 DIAGNOSIS — E119 Type 2 diabetes mellitus without complications: Secondary | ICD-10-CM | POA: Diagnosis not present

## 2021-06-02 NOTE — Patient Instructions (Signed)
Visit Information It was great speaking with you today!  Please let me know if you have any questions about our visit.   Goals Addressed             This Visit's Progress    Manage My Medicine       Timeframe:  Long-Range Goal Priority:  High Start Date:       07/07/2020 Expected End Date:                       Follow Up Date: 08/19/2021   In Progress:  - call for medicine refill 2 or 3 days before it runs out - call if I am sick and can't take my medicine - keep a list of all the medicines I take; vitamins and herbals too - use a pillbox to sort medicine - use an alarm clock or phone to remind me to take my medicine    Why is this important?   These steps will help you keep on track with your medicines.        Patient Care Plan: CCM Pharmacy Care Plan     Problem Identified: HLD, DM II   Priority: High     Long-Range Goal: Disease Management   Start Date: 01/14/2021  Recent Progress: On track  Priority: High  Note:   Current Barriers:  Unable to independently afford treatment regimen Unable to independently monitor therapeutic efficacy  Pharmacist Clinical Goal(s):  Patient will achieve adherence to monitoring guidelines and medication adherence to achieve therapeutic efficacy through collaboration with PharmD and provider.   Interventions: 1:1 collaboration with [name], FNP regarding development and update of comprehensive plan of care as evidenced by provider attestation and co-signature Inter-disciplinary care team collaboration (see longitudinal plan of care) Comprehensive medication review performed; medication list updated in electronic medical record  Hyperlipidemia: (LDL goal < 70) -Not ideally controlled -Current treatment: Atorvastatin 80 mg tablet once per day  Zetia 10 mg tablet once per day  -Current dietary patterns: Patient reports eating [place] every day for breakfast that includes a sausage biscuit with egg and grits in the  morning, patient reports that he does not cook. He does has a consistent routine in the morning so we discussed the other menu options available to him, including changing his bread to an [place] Muffin  -We discussed the cost of eating out and health.  -Current exercise habits:  rarely he sometimes walks at the shopping center  -Educated on Benefits of statin for ASCVD risk reduction; Importance of limiting foods high in cholesterol; -Recommended patient limit the fried and fatty foods  -Patient reports that he is going cut out the biscuit from his breakfast every morning  -We also discussed limiting his high carb meals and high fat meals because his triglycerides continue to trend upward and he is only eating fast food through out the day.  -We reviewed the amount of fat and sodium in his daily breakfast from [place] and the cost that he is paying per day at least 10 dollars on this meal alone, and options that he can choose that will be more healthy for him.  -We also discussed his evening frozen meals and different options around dinner.  - Patient reports that the heart doctor recommended that he cut back on what he was eating as well.  -Recommended to continue current medication Collaborated with patient and he agreed to add more frozen vegetables to his diet that he can  easily microwave and still have a tasty treat.   Diabetes (A1c goal <7%) -Controlled -Current medications: Rybelsus 14 mg tablet once per day  Tresiba FlexTouch - inject 50 units into the skin daily  Jardiance 25 mg tablet daily -he had 20 pills left  Patient reports that he can not get them  -Current home glucose readings: will discuss during next visit  -Denies hypoglycemic/hyperglycemic symptoms -Current meal patterns:  breakfast:  eating [place] everyday for breakfast  dinner: a sandwich at 3 or 4pm from - tomato sandwich, or ham sandwich, or a salad, sometimes popcorn, eating a lot of beans  snacks:  eating popcorn drinks: he has eliminated the amount of sugar in his drinks. He only puts a creamer -Current exercise: rarely he sometimes walks at the shopping center  -Educated on A1c and blood sugar goals; Complications of diabetes including kidney damage, retinal damage, and cardiovascular disease; -Patient reports that his bottle said he had 2 refills left for Jardiance but he was not able to fill it.  -Counseled to check feet daily and get yearly eye exams - We discussed in detail the processes and steps that he would be taking to get his medication.  -He knows that his patient assistance is still pending for Jardiance. He has 20 pills left currently, he counted with me while we were on the phone.  -He also knows that he will be able to pick up his medication if necessary from [place].  -He reported that he will be able to afford it because he has paid for it in the past with the pending approval. I am glad he feels comfortable reaching out to all of [place] and would like to continue to encourage him to also focus on changes that he can do at home to get towards his goals and stay healthy. We will continue to reach out to him as we have been at least once a month if not more. I will send this through a secure message as well so that we do not lose track of this ongoing conversation. Thank You.  -Recommended to continue current medication  Patient Goals/Self-Care Activities Patient will:  - take medications as prescribed as evidenced by patient report and record review  Follow Up Plan: The patient has been provided with contact information for the care management team and has been advised to call with any health related questions or concerns.       Patient agreed to services and verbal consent obtained.   The patient verbalized understanding of instructions, educational materials, and care plan provided today and agreed to receive a mailed copy of patient instructions, educational  materials, and care plan.   [name], PharmD Clinical Pharmacist [facility] [phone]

## 2021-06-08 ENCOUNTER — Other Ambulatory Visit: Payer: Self-pay | Admitting: Nurse Practitioner

## 2021-06-08 DIAGNOSIS — E1165 Type 2 diabetes mellitus with hyperglycemia: Secondary | ICD-10-CM

## 2021-06-09 ENCOUNTER — Telehealth (INDEPENDENT_AMBULATORY_CARE_PROVIDER_SITE_OTHER): Payer: Medicare Other | Admitting: Physician Assistant

## 2021-06-09 ENCOUNTER — Encounter: Payer: Self-pay | Admitting: Physician Assistant

## 2021-06-09 DIAGNOSIS — F411 Generalized anxiety disorder: Secondary | ICD-10-CM

## 2021-06-09 DIAGNOSIS — F3341 Major depressive disorder, recurrent, in partial remission: Secondary | ICD-10-CM | POA: Diagnosis not present

## 2021-06-09 MED ORDER — LORAZEPAM 1 MG PO TABS: ORAL_TABLET | ORAL | 1 refills | Status: DC

## 2021-06-09 NOTE — Progress Notes (Signed)
[facility] Med Check  Patient ID: Robert Walters,  MRN: [id number]  PCP: [name], FNP  Date of Evaluation: 06/09/2021 Time spent:25 minutes  Chief Complaint:  Chief Complaint   Anxiety; Depression; Follow-up    Virtual Visit via Telehealth  I connected with patient by telephone, with their informed consent, and verified patient privacy and that I am speaking with the correct person using two identifiers.  I am private, in my office and the patient is at home.  He has no way of doing a video visit.  I discussed the limitations, risks, security and privacy concerns of performing an evaluation and management service by telephone and the availability of in person appointments. I also discussed with the patient that there may be a patient responsible charge related to this service. The patient expressed understanding and agreed to proceed.   I discussed the assessment and treatment plan with the patient. The patient was provided an opportunity to ask questions and all were answered. The patient agreed with the plan and demonstrated an understanding of the instructions.   The patient was advised to call back or seek an in-person evaluation if the symptoms worsen or if the condition fails to improve as anticipated.  I provided 25 minutes of non-face-to-face time during this encounter.  HISTORY/CURRENT STATUS: HPI For routine 8-month med check.  Patient denies loss of interest in usual activities and is able to enjoy things.  States he does not go many places unless he just has to.  "I do not like to drive like I did when I was younger."  Denies decreased energy or motivation.  Appetite has not changed.  No extreme sadness, tearfulness, or feelings of hopelessness.  Denies any changes in concentration, making decisions or remembering things. Anxiety is well controlled, takes the Ativan which helps. Does need it daily, usually every morning, when the anxiety is the worst.  He sleeps  well.  Denies suicidal or homicidal thoughts.  Patient denies increased energy with decreased need for sleep, no increased talkativeness, no racing thoughts, no impulsivity or risky behaviors, no increased spending, no increased libido, no grandiosity, no increased irritability or anger, no paranoia, and no hallucinations.  Denies dizziness, syncope, seizures, numbness, tingling, tremor, tics, unsteady gait, slurred speech, confusion. Denies muscle or joint pain, stiffness, or dystonia.  Individual Medical History/ Review of Systems: Changes? :No    Past medications for mental health diagnoses include: Ambien, Ativan, Epitol, Lamictal, Prozac, Xanax, Wellbutrin  Allergies: Patient has no known allergies.  Current Medications:  Current Outpatient Medications:    alfuzosin (UROXATRAL) 10 MG 24 hr tablet, Take 1 tablet (10 mg total) by mouth daily. (Patient taking differently: Take 10 mg by mouth in the morning and at bedtime.), Disp: 30 tablet, Rfl: 2   Ascorbic Acid (VITAMIN C) 500 MG CAPS, Take 500 mg by mouth daily. , Disp: , Rfl:    aspirin EC 81 MG tablet, Take 1 tablet (81 mg total) by mouth daily., Disp: , Rfl:    atorvastatin (LIPITOR) 80 MG tablet, Take 80 mg by mouth daily., Disp: , Rfl:    b complex vitamins tablet, Take 1 tablet by mouth daily. , Disp: , Rfl:    cholecalciferol (VITAMIN D3) 25 MCG (1000 UNIT) tablet, Take 2,000 Units by mouth daily., Disp: , Rfl:    CINNAMON PO, Take 2,000 mg by mouth daily., Disp: , Rfl:    Coenzyme Q10 (COQ10) 200 MG CAPS, Take 200 mg by mouth daily., Disp: , Rfl:  empagliflozin (JARDIANCE) 25 MG TABS tablet, Take 1 tablet (25 mg total) by mouth daily before breakfast., Disp: 30 tablet, Rfl: 2   ezetimibe (ZETIA) 10 MG tablet, Take 1 tablet (10 mg total) by mouth daily., Disp: 90 tablet, Rfl: 3   FLUoxetine (PROZAC) 40 MG capsule, TAKE 1 CAPSULE( 40MG  TOTAL) BY MOUTH EVERY MORNING, Disp: 90 capsule, Rfl: 1   Garlic 106 MG CAPS, Take 500 mg by  mouth daily., Disp: , Rfl:    insulin degludec (TRESIBA FLEXTOUCH) 100 UNIT/ML FlexTouch Pen, Inject 50 Units into the skin daily. (Patient taking differently: Inject 55 Units into the skin daily.), Disp: 15 mL, Rfl: 0   Insulin Pen Needle (PEN NEEDLES) 32G X 4 MM MISC, Use as directed, Disp: 35 each, Rfl: 0   lamoTRIgine (LAMICTAL) 150 MG tablet, Take 1 tablet (150 mg total) by mouth 2 (two) times daily., Disp: 180 tablet, Rfl: 1   Menthol-Methyl Salicylate (ARTHRITIS HOT) 10-15 % CREA, Apply 1 application topically daily as needed (pain)., Disp: , Rfl:    Menthol-Methyl Salicylate (ICY HOT) 26-94 % STCK, Apply 1 application topically 3 (three) times daily as needed (pain)., Disp: , Rfl:    metFORMIN (GLUCOPHAGE) 1000 MG tablet, TAKE 1 TABLET BY MOUTH 2 TIMES DAILY WITH MEALS, Disp: 180 tablet, Rfl: 1   metoprolol tartrate (LOPRESSOR) 25 MG tablet, TAKE 1 TABLET BY MOUTH 2 TIMES DAILY. GENERIC EQUIVALENT FOR LOPRESSOR. Please make overdue appt with [name] before anymore refills. Thank you 2nd attempt, Disp: 30 tablet, Rfl: 0   Multiple Vitamin (MULTIVITAMIN WITH MINERALS) TABS tablet, Take 1 tablet by mouth daily., Disp: , Rfl:    Omega-3 Fatty Acids (FISH OIL) 1000 MG CAPS, Take 2,000 mg by mouth daily., Disp: , Rfl:    omeprazole (PRILOSEC) 40 MG capsule, Take 1 capsule (40 mg total) by mouth daily., Disp: 90 capsule, Rfl: 3   ONETOUCH ULTRA test strip, USE 1 STRIP TO CHECK GLUCOSE THREE TIMES DAILY, Disp: 100 each, Rfl: 0   Semaglutide (RYBELSUS) 14 MG TABS, Take 14 mg by mouth daily at 6 (six) AM., Disp: 90 tablet, Rfl: 0   valsartan (DIOVAN) 320 MG tablet, Take 1 tablet (320 mg total) by mouth daily., Disp: 90 tablet, Rfl: 3   LORazepam (ATIVAN) 1 MG tablet, TAKE 1 TABLET BY MOUTH THREE TIMES DAILY AS NEEDED FOR ANXIETY. GENERIC EQUIVALENT FOR ATIVAN., Disp: 270 tablet, Rfl: 1 Medication Side Effects: none  Family Medical/ Social History: Changes?  No  MENTAL HEALTH EXAM:  There were  no vitals taken for this visit.There is no height or weight on file to calculate BMI.  General Appearance:  unable to assess  Eye Contact:   unable to assess  Speech:  Clear and Coherent and Normal Rate  Volume:  Normal  Mood:  Euthymic  Affect:   unable to assess  Thought Process:  Goal Directed and Descriptions of Associations: Circumstantial  Orientation:  Full (Time, Place, and Person)  Thought Content: Logical   Suicidal Thoughts:  No  Homicidal Thoughts:  No  Memory:  WNL  Judgement:  Good  Insight:  Good  Psychomotor Activity:   unable to assess  Concentration:  Concentration: Good and Attention Span: Good  Recall:  Good  Fund of Knowledge: Good  Language: Good  Assets:  Desire for Improvement Financial Resources/Insurance Housing Transportation  ADL's:  Intact  Cognition: WNL  Prognosis:  Good    DIAGNOSES:    ICD-10-CM   1. Recurrent major depressive disorder,  in partial remission (HCC)  F33.41     2. Generalized anxiety disorder  F41.1       Receiving Psychotherapy: No    RECOMMENDATIONS:  PDMP was reviewed. Ativan last filled 01/30/2021. I provided 25 minutes of non-face-to-face time during this encounter, including time spent before and after the visit in records review, medical decision making, counseling pertinent to today's visit, and charting.  Audry Pili is doing well with psych meds so no changes are needed.  He understands the increased risk of falls with benzodiazepines.  He accepts that risk. Continue Prozac 40 mg every morning. Continue Lamictal 150 mg 1 p.o. twice daily. Continue Ativan 1 mg, 1 p.o. 3 times daily as needed. Return in 6 months.  Donnal Moat, PA-C

## 2021-06-24 ENCOUNTER — Telehealth: Payer: Self-pay

## 2021-06-24 NOTE — Chronic Care Management (AMB) (Signed)
° ° °  Chronic Care Management Pharmacy Assistant   Name: Robert Walters  MRN: [id number] DOB: 04-Jan-1956  Reason for Encounter: Patient Assistance Coordination  06/24/2021- Called [facility] to check on patient status of Jardiance. Spoke with representative, patient is due for re-enrollment application. Filling out 2023 re-enrollment application for patient and mailing to patient to complete and send back to PCP office for signature and fax. Patient notified.   Medications: Outpatient Encounter Medications as of 06/24/2021  Medication Sig Note   alfuzosin (UROXATRAL) 10 MG 24 hr tablet Take 1 tablet (10 mg total) by mouth daily. (Patient taking differently: Take 10 mg by mouth in the morning and at bedtime.)    Ascorbic Acid (VITAMIN C) 500 MG CAPS Take 500 mg by mouth daily.     aspirin EC 81 MG tablet Take 1 tablet (81 mg total) by mouth daily.    atorvastatin (LIPITOR) 80 MG tablet Take 80 mg by mouth daily.    b complex vitamins tablet Take 1 tablet by mouth daily.     cholecalciferol (VITAMIN D3) 25 MCG (1000 UNIT) tablet Take 2,000 Units by mouth daily.    CINNAMON PO Take 2,000 mg by mouth daily.    Coenzyme Q10 (COQ10) 200 MG CAPS Take 200 mg by mouth daily.    empagliflozin (JARDIANCE) 25 MG TABS tablet Take 1 tablet (25 mg total) by mouth daily before breakfast.    ezetimibe (ZETIA) 10 MG tablet Take 1 tablet (10 mg total) by mouth daily.    FLUoxetine (PROZAC) 40 MG capsule TAKE 1 CAPSULE( 40MG  TOTAL) BY MOUTH EVERY MORNING    Garlic 902 MG CAPS Take 500 mg by mouth daily.    insulin degludec (TRESIBA FLEXTOUCH) 100 UNIT/ML FlexTouch Pen Inject 50 Units into the skin daily. (Patient taking differently: Inject 55 Units into the skin daily.) 02/03/2021: Took 27 units last night   Insulin Pen Needle (PEN NEEDLES) 32G X 4 MM MISC Use as directed    lamoTRIgine (LAMICTAL) 150 MG tablet Take 1 tablet (150 mg total) by mouth 2 (two) times daily.    LORazepam (ATIVAN) 1 MG tablet TAKE 1 TABLET  BY MOUTH THREE TIMES DAILY AS NEEDED FOR ANXIETY. GENERIC EQUIVALENT FOR ATIVAN.    Menthol-Methyl Salicylate (ARTHRITIS HOT) 10-15 % CREA Apply 1 application topically daily as needed (pain).    Menthol-Methyl Salicylate (ICY HOT) 40-97 % STCK Apply 1 application topically 3 (three) times daily as needed (pain).    metFORMIN (GLUCOPHAGE) 1000 MG tablet TAKE 1 TABLET BY MOUTH 2 TIMES DAILY WITH MEALS    metoprolol tartrate (LOPRESSOR) 25 MG tablet TAKE 1 TABLET BY MOUTH 2 TIMES DAILY. GENERIC EQUIVALENT FOR LOPRESSOR. Please make overdue appt with [name] before anymore refills. Thank you 2nd attempt    Multiple Vitamin (MULTIVITAMIN WITH MINERALS) TABS tablet Take 1 tablet by mouth daily.    Omega-3 Fatty Acids (FISH OIL) 1000 MG CAPS Take 2,000 mg by mouth daily.    omeprazole (PRILOSEC) 40 MG capsule Take 1 capsule (40 mg total) by mouth daily.    ONETOUCH ULTRA test strip USE 1 STRIP TO CHECK GLUCOSE THREE TIMES DAILY    Semaglutide (RYBELSUS) 14 MG TABS Take 14 mg by mouth daily at 6 (six) AM.    valsartan (DIOVAN) 320 MG tablet Take 1 tablet (320 mg total) by mouth daily.    No facility-administered encounter medications on file as of 06/24/2021.   Pattricia Boss, [place] Pharmacist Assistant [phone]

## 2021-06-25 ENCOUNTER — Other Ambulatory Visit: Payer: Self-pay | Admitting: Cardiovascular Disease

## 2021-06-29 DIAGNOSIS — I739 Peripheral vascular disease, unspecified: Secondary | ICD-10-CM | POA: Diagnosis not present

## 2021-07-01 ENCOUNTER — Encounter: Payer: Self-pay | Admitting: Cardiovascular Disease

## 2021-07-01 NOTE — Telephone Encounter (Signed)
error 

## 2021-07-07 ENCOUNTER — Telehealth: Payer: Self-pay

## 2021-07-07 NOTE — Chronic Care Management (AMB) (Signed)
? ? ?Chronic Care Management ?Pharmacy Assistant  ? ?Name: Robert Walters  MRN: [id number] DOB: 02-18-1956 ? ?Reason for Encounter: Patient assistance  ?  ? ?Medications: ?Outpatient Encounter Medications as of 07/07/2021  ?Medication Sig Note  ? alfuzosin (UROXATRAL) 10 MG 24 hr tablet Take 1 tablet (10 mg total) by mouth daily. (Patient taking differently: Take 10 mg by mouth in the morning and at bedtime.)   ? Ascorbic Acid (VITAMIN C) 500 MG CAPS Take 500 mg by mouth daily.    ? aspirin EC 81 MG tablet Take 1 tablet (81 mg total) by mouth daily.   ? atorvastatin (LIPITOR) 80 MG tablet Take 80 mg by mouth daily.   ? b complex vitamins tablet Take 1 tablet by mouth daily.    ? cholecalciferol (VITAMIN D3) 25 MCG (1000 UNIT) tablet Take 2,000 Units by mouth daily.   ? CINNAMON PO Take 2,000 mg by mouth daily.   ? Coenzyme Q10 (COQ10) 200 MG CAPS Take 200 mg by mouth daily.   ? empagliflozin (JARDIANCE) 25 MG TABS tablet Take 1 tablet (25 mg total) by mouth daily before breakfast.   ? ezetimibe (ZETIA) 10 MG tablet Take 1 tablet (10 mg total) by mouth daily.   ? FLUoxetine (PROZAC) 40 MG capsule TAKE 1 CAPSULE( '40MG'$  TOTAL) BY MOUTH EVERY MORNING   ? Garlic 417 MG CAPS Take 500 mg by mouth daily.   ? insulin degludec (TRESIBA FLEXTOUCH) 100 UNIT/ML FlexTouch Pen Inject 50 Units into the skin daily. (Patient taking differently: Inject 55 Units into the skin daily.) 02/03/2021: Took 27 units last night  ? Insulin Pen Needle (PEN NEEDLES) 32G X 4 MM MISC Use as directed   ? lamoTRIgine (LAMICTAL) 150 MG tablet Take 1 tablet (150 mg total) by mouth 2 (two) times daily.   ? LORazepam (ATIVAN) 1 MG tablet TAKE 1 TABLET BY MOUTH THREE TIMES DAILY AS NEEDED FOR ANXIETY. GENERIC EQUIVALENT FOR ATIVAN.   ? Menthol-Methyl Salicylate (ARTHRITIS HOT) 10-15 % CREA Apply 1 application topically daily as needed (pain).   ? Menthol-Methyl Salicylate (ICY HOT) 40-81 % STCK Apply 1 application topically 3 (three) times daily as needed  (pain).   ? metFORMIN (GLUCOPHAGE) 1000 MG tablet TAKE 1 TABLET BY MOUTH 2 TIMES DAILY WITH MEALS   ? metoprolol tartrate (LOPRESSOR) 25 MG tablet TAKE 1 TABLET BY MOUTH 2 TIMES DAILY GENERIC EQUIVALENT FOR LOPRESSOR. MAKE OVERDUE APPOINTMENT WITH MD NAHSER BEFORE ANYMORE REFILLS [phone].   ? Multiple Vitamin (MULTIVITAMIN WITH MINERALS) TABS tablet Take 1 tablet by mouth daily.   ? Omega-3 Fatty Acids (FISH OIL) 1000 MG CAPS Take 2,000 mg by mouth daily.   ? omeprazole (PRILOSEC) 40 MG capsule Take 1 capsule (40 mg total) by mouth daily.   ? ONETOUCH ULTRA test strip USE 1 STRIP TO CHECK GLUCOSE THREE TIMES DAILY   ? Semaglutide (RYBELSUS) 14 MG TABS Take 14 mg by mouth daily at 6 (six) AM.   ? valsartan (DIOVAN) 320 MG tablet Take 1 tablet (320 mg total) by mouth daily.   ? ?No facility-administered encounter medications on file as of 07/07/2021.  ? ?07-07-2021: Patient stated he received a letter form [employer] patient assistance saying he will receive Rybelsus 10-14 days and it's been over a month. Called [employer] patient assistance and automated system only gave me the option for needles not Rybelsus or tresiba..Requested a call back on 07-08-2021 at 9:00. Never received call back so called company back and waited until  representative answered. Was informed both medications are in  process of shipping. Rybeleus was shipped on a Friday and returned since office was closed. Vouchers were provided. ?Rybelsus: ?BIN [id number] ?PCN CNRX ?GP [id number] ?ID [id number] ? ?[name]: ?BIN [id number] ?PCN CNRX ?GP [id number] ?ID [id number] ? ?Needles: ?BIN [id number] ?PCN CNRX ?GP [id number] ?ID [id number] ? ? ?[name] CMA ?Clinical Pharmacist Assistant ?[phone] ? ?

## 2021-07-13 ENCOUNTER — Other Ambulatory Visit: Payer: Self-pay

## 2021-07-13 ENCOUNTER — Encounter: Payer: Self-pay | Admitting: Nurse Practitioner

## 2021-07-13 ENCOUNTER — Ambulatory Visit (INDEPENDENT_AMBULATORY_CARE_PROVIDER_SITE_OTHER): Payer: Medicare Other | Admitting: Nurse Practitioner

## 2021-07-13 VITALS — BP 130/70 | HR 103 | Temp 97.5°F | Ht 68.8 in | Wt 235.0 lb

## 2021-07-13 DIAGNOSIS — E6609 Other obesity due to excess calories: Secondary | ICD-10-CM

## 2021-07-13 DIAGNOSIS — E782 Mixed hyperlipidemia: Secondary | ICD-10-CM | POA: Diagnosis not present

## 2021-07-13 DIAGNOSIS — E1122 Type 2 diabetes mellitus with diabetic chronic kidney disease: Secondary | ICD-10-CM

## 2021-07-13 DIAGNOSIS — N1831 Chronic kidney disease, stage 3a: Secondary | ICD-10-CM

## 2021-07-13 DIAGNOSIS — I10 Essential (primary) hypertension: Secondary | ICD-10-CM

## 2021-07-13 DIAGNOSIS — Z794 Long term (current) use of insulin: Secondary | ICD-10-CM

## 2021-07-13 DIAGNOSIS — Z79899 Other long term (current) drug therapy: Secondary | ICD-10-CM

## 2021-07-13 DIAGNOSIS — Z6834 Body mass index (BMI) 34.0-34.9, adult: Secondary | ICD-10-CM

## 2021-07-13 DIAGNOSIS — I129 Hypertensive chronic kidney disease with stage 1 through stage 4 chronic kidney disease, or unspecified chronic kidney disease: Secondary | ICD-10-CM

## 2021-07-13 MED ORDER — ATORVASTATIN CALCIUM 80 MG PO TABS: 80.0000 mg | ORAL_TABLET | Freq: Every day | ORAL | 1 refills | Status: DC

## 2021-07-13 MED ORDER — TRESIBA FLEXTOUCH 100 UNIT/ML ~~LOC~~ SOPN: 55.0000 [IU] | PEN_INJECTOR | Freq: Every day | SUBCUTANEOUS | 1 refills | Status: DC

## 2021-07-13 MED ORDER — PEN NEEDLES 32G X 4 MM MISC: 2 refills | Status: AC

## 2021-07-13 NOTE — Progress Notes (Signed)
I,Robert Walters,acting as a Education administrator for [employer], FNP.,have documented all relevant documentation on the behalf of Robert Brine, FNP,as directed by  Robert Brine, FNP while in the presence of Robert Walters, [place].  This visit occurred during the SARS-CoV-2 public health emergency.  Safety protocols were in place, including screening questions prior to the visit, additional usage of staff PPE, and extensive cleaning of exam room while observing appropriate contact time as indicated for disinfecting solutions.  Subjective:     Patient ID: Robert Walters , male    DOB: 1956/03/31 , 66 y.o.   MRN: [id number]   Chief Complaint  Patient presents with   Diabetes   Hypertension    HPI  Pt is here today to follow up on d/m. He doesn't have any other concerns going on.   Diabetes He presents for his follow-up diabetic visit. He has type 2 diabetes mellitus. His disease course has been stable. There are no hypoglycemic associated symptoms. Pertinent negatives for hypoglycemia include no dizziness or headaches. There are no diabetic associated symptoms. Pertinent negatives for diabetes include no chest pain, no fatigue, no polydipsia, no polyphagia and no polyuria. There are no hypoglycemic complications. Symptoms are stable. Diabetic complications include heart disease. Risk factors for coronary artery disease include diabetes mellitus, sedentary lifestyle, male sex and obesity. Current diabetic treatment includes oral agent (monotherapy). He is compliant with treatment all of the time. He is following a generally unhealthy diet. He has not had a previous visit with a dietitian. He rarely participates in exercise. (Blood sugars in morning 129-130, he reports the highest is 210.  ) An ACE inhibitor/angiotensin II receptor blocker is being taken. He does not see a podiatrist.Eye exam is current.  Hypertension This is a chronic problem. The current episode started more than 1 year ago. The problem is  unchanged. The problem is controlled. Pertinent negatives include no anxiety, chest pain, headaches or palpitations. There are no associated agents to hypertension. Risk factors for coronary artery disease include obesity and sedentary lifestyle. There are no compliance problems.  There is no history of angina. There is no history of chronic renal disease.    Past Medical History:  Diagnosis Date   Acid reflux disease    Bipolar 1 disorder, mixed (HCC)    BPH (benign prostatic hypertrophy)    CVA (cerebral vascular accident) ([place]) 07/2015   Gastritis    H/O hiatal hernia    Hyperlipidemia    Hypertension    Normal nuclear stress test Dec 2011   Obesity    TIA (transient ischemic attack) 05/15/2012   "they think I've had one this am @ 0130" (05/15/2012)   Type II diabetes mellitus ([place])    Vertigo      Family History  Problem Relation Age of Onset   Hyperlipidemia Father        in a nursing home   Colon cancer Father    Stroke Mother 7     Current Outpatient Medications:    alfuzosin (UROXATRAL) 10 MG 24 hr tablet, Take 1 tablet (10 mg total) by mouth daily. (Patient taking differently: Take 10 mg by mouth in the morning and at bedtime.), Disp: 30 tablet, Rfl: 2   Ascorbic Acid (VITAMIN C) 500 MG CAPS, Take 500 mg by mouth daily. , Disp: , Rfl:    aspirin EC 81 MG tablet, Take 1 tablet (81 mg total) by mouth daily., Disp: , Rfl:    b complex vitamins tablet, Take 1 tablet  by mouth daily. , Disp: , Rfl:    cholecalciferol (VITAMIN D3) 25 MCG (1000 UNIT) tablet, Take 2,000 Units by mouth daily., Disp: , Rfl:    CINNAMON PO, Take 2,000 mg by mouth daily., Disp: , Rfl:    Coenzyme Q10 (COQ10) 200 MG CAPS, Take 200 mg by mouth daily., Disp: , Rfl:    empagliflozin (JARDIANCE) 25 MG TABS tablet, Take 1 tablet (25 mg total) by mouth daily before breakfast., Disp: 30 tablet, Rfl: 2   ezetimibe (ZETIA) 10 MG tablet, Take 1 tablet (10 mg total) by mouth daily., Disp: 90 tablet, Rfl: 3    FLUoxetine (PROZAC) 40 MG capsule, TAKE 1 CAPSULE( 40MG TOTAL) BY MOUTH EVERY MORNING, Disp: 90 capsule, Rfl: 1   Garlic 939 MG CAPS, Take 500 mg by mouth daily., Disp: , Rfl:    lamoTRIgine (LAMICTAL) 150 MG tablet, Take 1 tablet (150 mg total) by mouth 2 (two) times daily., Disp: 180 tablet, Rfl: 1   LORazepam (ATIVAN) 1 MG tablet, TAKE 1 TABLET BY MOUTH THREE TIMES DAILY AS NEEDED FOR ANXIETY. GENERIC EQUIVALENT FOR ATIVAN., Disp: 270 tablet, Rfl: 1   Menthol-Methyl Salicylate (ARTHRITIS HOT) 10-15 % CREA, Apply 1 application topically daily as needed (pain)., Disp: , Rfl:    Menthol-Methyl Salicylate (ICY HOT) 03-00 % STCK, Apply 1 application topically 3 (three) times daily as needed (pain)., Disp: , Rfl:    metFORMIN (GLUCOPHAGE) 1000 MG tablet, TAKE 1 TABLET BY MOUTH 2 TIMES DAILY WITH MEALS, Disp: 180 tablet, Rfl: 1   metoprolol tartrate (LOPRESSOR) 25 MG tablet, TAKE 1 TABLET BY MOUTH 2 TIMES DAILY GENERIC EQUIVALENT FOR LOPRESSOR. MAKE OVERDUE APPOINTMENT WITH MD Dupont ANYMORE REFILLS [phone]., Disp: 30 tablet, Rfl: 0   Multiple Vitamin (MULTIVITAMIN WITH MINERALS) TABS tablet, Take 1 tablet by mouth daily., Disp: , Rfl:    Omega-3 Fatty Acids (FISH OIL) 1000 MG CAPS, Take 2,000 mg by mouth daily., Disp: , Rfl:    omeprazole (PRILOSEC) 40 MG capsule, Take 1 capsule (40 mg total) by mouth daily., Disp: 90 capsule, Rfl: 3   ONETOUCH ULTRA test strip, USE 1 STRIP TO CHECK GLUCOSE THREE TIMES DAILY, Disp: 100 each, Rfl: 0   Semaglutide (RYBELSUS) 14 MG TABS, Take 14 mg by mouth daily at 6 (six) AM., Disp: 90 tablet, Rfl: 0   valsartan (DIOVAN) 320 MG tablet, Take 1 tablet (320 mg total) by mouth daily., Disp: 90 tablet, Rfl: 3   atorvastatin (LIPITOR) 80 MG tablet, Take 1 tablet (80 mg total) by mouth daily., Disp: 90 tablet, Rfl: 1   insulin degludec (TRESIBA FLEXTOUCH) 100 UNIT/ML FlexTouch Pen, Inject 55 Units into the skin daily., Disp: 15 mL, Rfl: 1   Insulin Pen Needle (PEN  NEEDLES) 32G X 4 MM MISC, Use as directed, Disp: 100 each, Rfl: 2   No Known Allergies   Review of Systems  Constitutional: Negative.  Negative for fatigue.  Respiratory: Negative.    Cardiovascular: Negative.  Negative for chest pain and palpitations.  Gastrointestinal: Negative.   Endocrine: Negative for polydipsia, polyphagia and polyuria.  Neurological: Negative.  Negative for dizziness and headaches.    Today's Vitals   07/13/21 1135  BP: 130/70  Pulse: (!) 103  Temp: (!) 97.5 F (36.4 C)  TempSrc: Oral  Weight: 235 lb (106.6 kg)  Height: 5' 8.8" (1.748 m)   Body mass index is 34.91 kg/m.  Wt Readings from Last 3 Encounters:  07/13/21 235 lb (106.6 kg)  04/14/21 237 lb  12.8 oz (107.9 kg)  04/02/21 238 lb (108 kg)    Objective:  Physical Exam Vitals reviewed.  Constitutional:      General: He is not in acute distress.    Appearance: Normal appearance. He is obese.  Eyes:     Extraocular Movements: Extraocular movements intact.     Conjunctiva/sclera: Conjunctivae normal.     Pupils: Pupils are equal, round, and reactive to light.  Cardiovascular:     Rate and Rhythm: Normal rate and regular rhythm.     Pulses: Normal pulses.     Heart sounds: Normal heart sounds. No murmur heard. Pulmonary:     Effort: Pulmonary effort is normal. No respiratory distress.     Breath sounds: Normal breath sounds. No wheezing.  Musculoskeletal:        General: No swelling or tenderness. Normal range of motion.  Skin:    General: Skin is warm and dry.     Capillary Refill: Capillary refill takes less than 2 seconds.  Neurological:     General: No focal deficit present.     Mental Status: He is alert and oriented to person, place, and time.     Cranial Nerves: No cranial nerve deficit.     Motor: No weakness.  Psychiatric:        Mood and Affect: Mood normal.        Behavior: Behavior normal.        Thought Content: Thought content normal.        Judgment: Judgment normal.         Assessment And Plan:     1. Type 2 diabetes mellitus with stage 3a chronic kidney disease, with long-term current use of insulin (HCC) Comments: HgbA1c is stable and improved.  Will check to see if he continues [place], [place] and Rybelsus. Diabetes foot exam done, no abnormal findings  - Hemoglobin A1c - insulin degludec (TRESIBA FLEXTOUCH) 100 UNIT/ML FlexTouch Pen; Inject 55 Units into the skin daily.  Dispense: 15 mL; Refill: 1 - Insulin Pen Needle (PEN NEEDLES) 32G X 4 MM MISC; Use as directed  Dispense: 100 each; Refill: 2  2. Essential hypertension Comments: Blood pressure is normal. Continue current medications - CMP14+EGFR  3. Mixed hyperlipidemia Comments: He did not have an improvement in his last levels, continue zetia and atorvastatin. Avoid fried and fatty foods. - Lipid panel  4. Class 1 obesity due to excess calories with serious comorbidity and body mass index (BMI) of 34.0 to 34.9 in adult Chronic Discussed healthy diet and regular exercise options  Encouraged to exercise at least 150 minutes per week with 2 days of strength training  5. Other long term (current) drug therapy - TSH  He is encouraged to initially strive for BMI less than 30 to decrease cardiac risk. He is advised to exercise no less than 150 minutes per week.     Patient was given opportunity to ask questions. Patient verbalized understanding of the plan and was able to repeat key elements of the plan. All questions were answered to their satisfaction.  Robert Brine, FNP   I, Robert Brine, FNP, have reviewed all documentation for this visit. The documentation on 07/13/21 for the exam, diagnosis, procedures, and orders are all accurate and complete.   IF YOU HAVE BEEN REFERRED TO A SPECIALIST, IT MAY TAKE 1-2 WEEKS TO SCHEDULE/PROCESS THE REFERRAL. IF YOU HAVE NOT HEARD FROM US/SPECIALIST IN TWO WEEKS, PLEASE GIVE [place] A CALL AT [phone].   THE  PATIENT IS ENCOURAGED TO PRACTICE  SOCIAL DISTANCING DUE TO THE COVID-19 PANDEMIC.

## 2021-07-13 NOTE — Patient Instructions (Addendum)

## 2021-07-14 ENCOUNTER — Ambulatory Visit (INDEPENDENT_AMBULATORY_CARE_PROVIDER_SITE_OTHER): Payer: Medicare Other

## 2021-07-14 ENCOUNTER — Telehealth: Payer: Medicare Other

## 2021-07-14 ENCOUNTER — Other Ambulatory Visit: Payer: Self-pay

## 2021-07-14 DIAGNOSIS — Z794 Long term (current) use of insulin: Secondary | ICD-10-CM

## 2021-07-14 DIAGNOSIS — E782 Mixed hyperlipidemia: Secondary | ICD-10-CM

## 2021-07-14 DIAGNOSIS — I10 Essential (primary) hypertension: Secondary | ICD-10-CM

## 2021-07-14 LAB — CMP14+EGFR
ALT: 55 IU/L — ABNORMAL HIGH (ref 0–44)
AST: 37 IU/L (ref 0–40)
Albumin/Globulin Ratio: 2 (ref 1.2–2.2)
Albumin: 4.5 g/dL (ref 3.8–4.8)
Alkaline Phosphatase: 172 IU/L — ABNORMAL HIGH (ref 44–121)
BUN/Creatinine Ratio: 16 (ref 10–24)
BUN: 22 mg/dL (ref 8–27)
Bilirubin Total: 0.6 mg/dL (ref 0.0–1.2)
CO2: 20 mmol/L (ref 20–29)
Calcium: 9.3 mg/dL (ref 8.6–10.2)
Chloride: 102 mmol/L (ref 96–106)
Creatinine, Ser: 1.4 mg/dL — ABNORMAL HIGH (ref 0.76–1.27)
Globulin, Total: 2.2 g/dL (ref 1.5–4.5)
Glucose: 139 mg/dL — ABNORMAL HIGH (ref 70–99)
Potassium: 4.8 mmol/L (ref 3.5–5.2)
Sodium: 140 mmol/L (ref 134–144)
Total Protein: 6.7 g/dL (ref 6.0–8.5)
eGFR: 55 mL/min/{1.73_m2} — ABNORMAL LOW (ref >59)

## 2021-07-14 LAB — LIPID PANEL
Chol/HDL Ratio: 3.2 ratio (ref 0.0–5.0)
Cholesterol, Total: 119 mg/dL (ref 100–199)
HDL: 37 mg/dL — ABNORMAL LOW (ref >39)
LDL Chol Calc (NIH): 45 mg/dL (ref 0–99)
Triglycerides: 234 mg/dL — ABNORMAL HIGH (ref 0–149)
VLDL Cholesterol Cal: 37 mg/dL (ref 5–40)

## 2021-07-14 LAB — TSH: TSH: 2.23 u[IU]/mL (ref 0.450–4.500)

## 2021-07-14 LAB — HEMOGLOBIN A1C
Est. average glucose Bld gHb Est-mCnc: 160 mg/dL
Hgb A1c MFr Bld: 7.2 % — ABNORMAL HIGH (ref 4.8–5.6)

## 2021-07-14 MED ORDER — RYBELSUS 14 MG PO TABS: 14.0000 mg | ORAL_TABLET | Freq: Every day | ORAL | 0 refills | Status: DC

## 2021-07-14 NOTE — Chronic Care Management (AMB) (Signed)
?Chronic Care Management  ? ?CCM RN Visit Note ? ?07/14/2021 ?Name: Robert Walters MRN: [id number] DOB: 07/07/1955 ? ?Subjective: ?Robert Walters is a 66 y.o. year old male who is a primary care patient of [name], [place]. The care management team was consulted for assistance with disease management and care coordination needs.   ? ?Collaboration with embedded Pharm D [name]   for  care coordination  in response to provider referral for case management and/or care coordination services.  ? ?Consent to Services:  ?The patient was given information about Chronic Care Management services, agreed to services, and gave verbal consent prior to initiation of services.  Please see initial visit note for detailed documentation.  ? ?Patient agreed to services and verbal consent obtained.  ? ?Assessment: Review of patient past medical history, allergies, medications, health status, including review of consultants reports, laboratory and other test data, was performed as part of comprehensive evaluation and provision of chronic care management services.  ? ?SDOH (Social Determinants of Health) assessments and interventions performed:   ? ?CCM Care Plan ? ?No Known Allergies ? ?Outpatient Encounter Medications as of 07/14/2021  ?Medication Sig  ? alfuzosin (UROXATRAL) 10 MG 24 hr tablet Take 1 tablet (10 mg total) by mouth daily. (Patient taking differently: Take 10 mg by mouth in the morning and at bedtime.)  ? Ascorbic Acid (VITAMIN C) 500 MG CAPS Take 500 mg by mouth daily.   ? aspirin EC 81 MG tablet Take 1 tablet (81 mg total) by mouth daily.  ? atorvastatin (LIPITOR) 80 MG tablet Take 1 tablet (80 mg total) by mouth daily.  ? b complex vitamins tablet Take 1 tablet by mouth daily.   ? cholecalciferol (VITAMIN D3) 25 MCG (1000 UNIT) tablet Take 2,000 Units by mouth daily.  ? CINNAMON PO Take 2,000 mg by mouth daily.  ? Coenzyme Q10 (COQ10) 200 MG CAPS Take 200 mg by mouth daily.  ? empagliflozin (JARDIANCE) 25 MG  TABS tablet Take 1 tablet (25 mg total) by mouth daily before breakfast.  ? ezetimibe (ZETIA) 10 MG tablet Take 1 tablet (10 mg total) by mouth daily.  ? FLUoxetine (PROZAC) 40 MG capsule TAKE 1 CAPSULE( '40MG'$  TOTAL) BY MOUTH EVERY MORNING  ? Garlic 297 MG CAPS Take 500 mg by mouth daily.  ? insulin degludec (TRESIBA FLEXTOUCH) 100 UNIT/ML FlexTouch Pen Inject 55 Units into the skin daily.  ? Insulin Pen Needle (PEN NEEDLES) 32G X 4 MM MISC Use as directed  ? lamoTRIgine (LAMICTAL) 150 MG tablet Take 1 tablet (150 mg total) by mouth 2 (two) times daily.  ? LORazepam (ATIVAN) 1 MG tablet TAKE 1 TABLET BY MOUTH THREE TIMES DAILY AS NEEDED FOR ANXIETY. GENERIC EQUIVALENT FOR ATIVAN.  ? Menthol-Methyl Salicylate (ARTHRITIS HOT) 10-15 % CREA Apply 1 application topically daily as needed (pain).  ? Menthol-Methyl Salicylate (ICY HOT) 98-92 % STCK Apply 1 application topically 3 (three) times daily as needed (pain).  ? metFORMIN (GLUCOPHAGE) 1000 MG tablet TAKE 1 TABLET BY MOUTH 2 TIMES DAILY WITH MEALS  ? metoprolol tartrate (LOPRESSOR) 25 MG tablet TAKE 1 TABLET BY MOUTH 2 TIMES DAILY GENERIC EQUIVALENT FOR LOPRESSOR. MAKE OVERDUE APPOINTMENT WITH MD NAHSER BEFORE ANYMORE REFILLS [phone].  ? Multiple Vitamin (MULTIVITAMIN WITH MINERALS) TABS tablet Take 1 tablet by mouth daily.  ? Omega-3 Fatty Acids (FISH OIL) 1000 MG CAPS Take 2,000 mg by mouth daily.  ? omeprazole (PRILOSEC) 40 MG capsule Take 1 capsule (40 mg total) by  mouth daily.  ? ONETOUCH ULTRA test strip USE 1 STRIP TO CHECK GLUCOSE THREE TIMES DAILY  ? Semaglutide (RYBELSUS) 14 MG TABS Take 1 tablet (14 mg total) by mouth daily at 6 (six) AM.  ? valsartan (DIOVAN) 320 MG tablet Take 1 tablet (320 mg total) by mouth daily.  ? ?No facility-administered encounter medications on file as of 07/14/2021.  ? ? ?Patient Active Problem List  ? Diagnosis Date Noted  ? NASH (nonalcoholic steatohepatitis) 11/20/2020  ? Dysphagia 11/20/2020  ? Chronic diastolic heart  failure ([place]) 03/27/2019  ? Fatty liver 12/26/2018  ? Elevated LFTs 12/26/2018  ? History of colonic polyps 12/26/2018  ? Fall 08/28/2018  ? Rib pain on left side 08/28/2018  ? Anxiety 03/31/2018  ? Bipolar II disorder ([place]) 03/31/2018  ? Insomnia 03/31/2018  ? Type 2 diabetes mellitus without complication, without long-term current use of insulin ([place]) 03/24/2018  ? Bile leak 09/15/2016  ? Sepsis ([place]) 08/23/2016  ? Cholecystitis   ? Stroke ([place]) 07/25/2015  ? 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 Dizziness 08/09/2014  ? TIA (transient ischemic attack) 05/15/2012  ? Blurred vision 05/15/2012  ? Ataxia 05/15/2012  ? Diabetes mellitus type 2 with complications ([place]) 00/[id number]/8182  ? BPH (benign prostatic hyperplasia) 05/15/2012  ? GERD (gastroesophageal reflux disease) 05/15/2012  ? Obesity 05/15/2012  ? Bipolar 1 disorder ([place]) 05/15/2012  ? Essential hypertension 10/30/2010  ? Cardiovascular risk factor 10/30/2010  ? Pure hypercholesterolemia 12/05/2008  ? ? ?Conditions to be addressed/monitored: DMII, Essential Hypertension, Stage 3a CKD, Mixed Hyperlipidemia  ? ?Care Plan : RN Care Manager Plan of Care  ?Updates made by Lynne Logan, RN since 07/14/2021 12:00 AM  ?  ? ?Problem: No plan established for management of chronic disease states (DMII, Essential Hypertension, Stage 3a CKD, Mixed Hyperlipidemia)   ?Priority: High  ?  ? ?Long-Range Goal: Development of plan of care for chronic disease management DMII, Essential Hypertension, Stage 3a CKD, Mixed Hyperlipidemia   ?Start Date: 03/10/2021  ?Expected End Date: 03/10/2022  ?Recent Progress: On track  ?Priority: High  ?Note:   ?Current Barriers:  ?Knowledge Deficits related to plan of care for management of DMII, Essential Hypertension, Stage 3a CKD, Mixed Hyperlipidemia  ?Chronic Disease Management support and education needs related to DMII, Essential Hypertension, Stage 3a CKD, Mixed Hyperlipidemia  ? ?RNCM Clinical Goal(s):  ?Patient will verbalize basic  understanding of  DMII, Essential Hypertension, Stage 3a CKD, Mixed Hyperlipidemia  disease process and self health management plan   ?take all medications exactly as prescribed and will call provider for medication related questions ?demonstrate Improved health management independence   ?continue to work with RN Care Manager to address care management and care coordination needs related to  DMII, Essential Hypertension, Stage 3a CKD, Mixed Hyperlipidemia  ?will demonstrate ongoing self health care management ability    through collaboration with RN Care manager, provider, and care team.  ? ?Interventions: ?1:1 collaboration with primary care provider regarding development and update of comprehensive plan of care as evidenced by provider attestation and co-signature ?Inter-disciplinary care team collaboration (see longitudinal plan of care) ?Evaluation of current treatment plan related to  self management and patient's adherence to plan as established by provider ? ?Hyperlipidemia Interventions: (Status:  Condition stable.  Not addressed this visit.) ?Determined patient completed a follow up with Dr. Debara Pickett for evaluation and treatment of his Hyperlipidemia ?Review of patient status, including review of consultant's reports, relevant laboratory and other test results, and medications  completed; ?Medication review performed; medication list updated in electronic medical record; ?Provider established cholesterol goals reviewed; ?Reviewed importance of limiting foods high in cholesterol; ?Reviewed exercise goals and target of 150 minutes per week; ?Discussed plans with patient for ongoing care management follow up and provided patient with direct contact information for care management team ?Lipid Panel  ?   ?Component Value Date/Time  ? CHOL 227 (H) 03/25/2021 1040  ? TRIG 376 (H) 03/25/2021 1040  ? HDL 41 03/25/2021 1040  ? CHOLHDL 5.5 (H) 03/25/2021 1040  ? CHOLHDL 7.1 07/26/2015 0647  ? VLDL UNABLE TO CALCULATE IF  TRIGLYCERIDE OVER 400 mg/dL 07/26/2015 0647  ? LDLCALC 120 (H) 03/25/2021 1040  ? LDLDIRECT 124.3 11/10/2010 0953  ? LABVLDL 66 (H) 03/25/2021 1040  ?  ? ? ?Diabetes Interventions:  (Status:  Goal on track:  Robert Walters

## 2021-07-14 NOTE — Chronic Care Management (AMB) (Signed)
? ? ?Chronic Care Management ?Pharmacy Assistant  ? ?Name: Robert Walters  MRN: [id number] DOB: [birth year] ? ? ?Reason for Encounter: Patient assistance ?  ? ?Medications: ?Outpatient Encounter Medications as of 07/07/2021  ?Medication Sig Note  ? alfuzosin (UROXATRAL) 10 MG 24 hr tablet Take 1 tablet (10 mg total) by mouth daily. (Patient taking differently: Take 10 mg by mouth in the morning and at bedtime.)   ? Ascorbic Acid (VITAMIN C) 500 MG CAPS Take 500 mg by mouth daily.    ? aspirin EC 81 MG tablet Take 1 tablet (81 mg total) by mouth daily.   ? b complex vitamins tablet Take 1 tablet by mouth daily.    ? cholecalciferol (VITAMIN D3) 25 MCG (1000 UNIT) tablet Take 2,000 Units by mouth daily.   ? CINNAMON PO Take 2,000 mg by mouth daily.   ? Coenzyme Q10 (COQ10) 200 MG CAPS Take 200 mg by mouth daily.   ? empagliflozin (JARDIANCE) 25 MG TABS tablet Take 1 tablet (25 mg total) by mouth daily before breakfast.   ? ezetimibe (ZETIA) 10 MG tablet Take 1 tablet (10 mg total) by mouth daily.   ? FLUoxetine (PROZAC) 40 MG capsule TAKE 1 CAPSULE( '40MG'$  TOTAL) BY MOUTH EVERY MORNING   ? Garlic 353 MG CAPS Take 500 mg by mouth daily.   ? lamoTRIgine (LAMICTAL) 150 MG tablet Take 1 tablet (150 mg total) by mouth 2 (two) times daily.   ? LORazepam (ATIVAN) 1 MG tablet TAKE 1 TABLET BY MOUTH THREE TIMES DAILY AS NEEDED FOR ANXIETY. GENERIC EQUIVALENT FOR ATIVAN.   ? Menthol-Methyl Salicylate (ARTHRITIS HOT) 10-15 % CREA Apply 1 application topically daily as needed (pain).   ? Menthol-Methyl Salicylate (ICY HOT) 29-92 % STCK Apply 1 application topically 3 (three) times daily as needed (pain).   ? metFORMIN (GLUCOPHAGE) 1000 MG tablet TAKE 1 TABLET BY MOUTH 2 TIMES DAILY WITH MEALS   ? metoprolol tartrate (LOPRESSOR) 25 MG tablet TAKE 1 TABLET BY MOUTH 2 TIMES DAILY GENERIC EQUIVALENT FOR LOPRESSOR. MAKE OVERDUE APPOINTMENT WITH MD NAHSER BEFORE ANYMORE REFILLS [phone].   ? Multiple Vitamin (MULTIVITAMIN WITH MINERALS)  TABS tablet Take 1 tablet by mouth daily.   ? Omega-3 Fatty Acids (FISH OIL) 1000 MG CAPS Take 2,000 mg by mouth daily.   ? omeprazole (PRILOSEC) 40 MG capsule Take 1 capsule (40 mg total) by mouth daily.   ? ONETOUCH ULTRA test strip USE 1 STRIP TO CHECK GLUCOSE THREE TIMES DAILY   ? Semaglutide (RYBELSUS) 14 MG TABS Take 14 mg by mouth daily at 6 (six) AM.   ? valsartan (DIOVAN) 320 MG tablet Take 1 tablet (320 mg total) by mouth daily.   ? [DISCONTINUED] atorvastatin (LIPITOR) 80 MG tablet Take 80 mg by mouth daily.   ? [DISCONTINUED] insulin degludec (TRESIBA FLEXTOUCH) 100 UNIT/ML FlexTouch Pen Inject 50 Units into the skin daily. (Patient taking differently: Inject 55 Units into the skin daily.) 02/03/2021: Took 27 units last night  ? [DISCONTINUED] Insulin Pen Needle (PEN NEEDLES) 32G X 4 MM MISC Use as directed   ? ?No facility-administered encounter medications on file as of 07/07/2021.  ? ?07-14-2021: [facility] and was on hold for 10 minutes. Was informed that needles were picked up with a copay of 28.00. Free voucher was provided for Rybelsus 14 mg and successful. Pharmacist stated rybelsus had one refill. Patient stated he picked up tresiba yesterday from office from patient assistance. Patient is now waiting on Jardiance  application to be approved. Patient stated he signed application for jardiance last week. Informed patient that application process may take a few weeks and patient stated he has samples of jardiance. ? ?Jeannette How CMA ?Clinical Pharmacist Assistant ?[phone] ? ?

## 2021-07-17 ENCOUNTER — Other Ambulatory Visit (HOSPITAL_COMMUNITY)
Admission: RE | Admit: 2021-07-17 | Discharge: 2021-07-17 | Disposition: A | Discharge status: Home / Self Care | Payer: Medicare Other | Source: Ambulatory Visit | Attending: Internal Medicine | Admitting: Internal Medicine

## 2021-07-17 DIAGNOSIS — E782 Mixed hyperlipidemia: Secondary | ICD-10-CM | POA: Insufficient documentation

## 2021-07-17 LAB — LIPID PANEL
Cholesterol: 116 mg/dL (ref 0–200)
HDL: 34 mg/dL — ABNORMAL LOW (ref >40)
LDL Cholesterol: 43 mg/dL (ref 0–99)
Total CHOL/HDL Ratio: 3.4 RATIO
Triglycerides: 193 mg/dL — ABNORMAL HIGH (ref <150)
VLDL: 39 mg/dL (ref 0–40)

## 2021-07-23 ENCOUNTER — Ambulatory Visit: Payer: Medicare Other | Admitting: Internal Medicine

## 2021-07-23 ENCOUNTER — Other Ambulatory Visit: Payer: Self-pay

## 2021-07-23 ENCOUNTER — Encounter: Payer: Self-pay | Admitting: Internal Medicine

## 2021-07-23 VITALS — BP 130/84 | HR 77 | Ht 69.0 in | Wt 236.6 lb

## 2021-07-23 DIAGNOSIS — E1169 Type 2 diabetes mellitus with other specified complication: Secondary | ICD-10-CM

## 2021-07-23 DIAGNOSIS — I5032 Chronic diastolic (congestive) heart failure: Secondary | ICD-10-CM

## 2021-07-23 DIAGNOSIS — E669 Obesity, unspecified: Secondary | ICD-10-CM | POA: Diagnosis not present

## 2021-07-23 DIAGNOSIS — E782 Mixed hyperlipidemia: Secondary | ICD-10-CM | POA: Diagnosis not present

## 2021-07-23 NOTE — Patient Instructions (Signed)
Labwork: ?Lipids- Fasting ? ?Follow-Up: ?Follow up with Dr. Debara Pickett in 6 months in the [place] office ? ?Any Other Special Instructions Will Be Listed Below (If Applicable). ? ? ? ? ?If you need a refill on your cardiac medications before your next appointment, please call your pharmacy. ? ?

## 2021-07-23 NOTE — Progress Notes (Signed)
? ?LIPID CLINIC CONSULT NOTE ? ?Chief Complaint:  ?Follow-up dyslipidemia ? ?Primary Care Physician: ?[name], FNP ? ?Primary Cardiologist:  ?Mertie Moores, MD ? ?HPI:  ?Robert Walters is a 66 y.o. male who is being seen today for the evaluation of dyslipidemia at the request of [name], [place]. 66 y.o. male who presents via audio/video conferencing for a telehealth visit today.  This is a pleasant 66 year old male patient of [name] with a history of hypertension, dyslipidemia, chronic diastolic heart failure and type 2 diabetes with gastroparesis.  More recently has had significant rise in A1c which peaked over 10 demonstrating morning blood sugars of 400.  He was started on long-acting insulin in July and has noted significant improvement in his numbers.  His triglycerides have tracked this as well rising from 371 a year ago up to 511 4 months ago.  LDL has remained fairly stable at 110 however he is on 2 mg daily.  He was also recommended to use over-the-counter fish oil. ? ?09/15/2020 ? ?Robert Walters returns today in the office for follow-up of dyslipidemia.  He had repeat lipids in February which showed total cholesterol 191, triglycerides 375, HDL 35 and LDL 94.  This does represent somewhat of a reduction in his lipids although not dramatic.  The triglycerides are probably the lowest we have seen in a while.  A lot of this may have been related to his hemoglobin A1c which was over 10 at one point but is now down to 6.9 as of February.  Current treatment strategy includes atorvastatin 40 mg daily, and 1 g of fish oil daily. ? ?04/01/2021 ? ?Robert Walters returns today for follow-up.  His cholesterol in the interim is gone up slightly.  Total cholesterol 222, triglycerides 376, HDL 41 and LDL 120, up from 91.  He reports compliance with his medicine.  He may have had some dietary changes.  He is working on his blood sugar.  He is on a good medical regimen.  Recently his primary care provider increased  his atorvastatin from 40 to 80 mg.  I am not sure that is enough to get him to target.  I like to see his LDL down below 70. ? ?07/23/2021 ? ?Robert Walters is seen today in follow-up.  His cholesterol continues to improve.  Total now 116, triglycerides 193 (the lowest its been in some years), HDL 34 and LDL 43.  He is on ezetimibe in addition to atorvastatin.  A1c has trended up slightly to 7.2 but he is on Jardiance, Rybelsus and [place]. ? ?PMHx:  ?Past Medical History:  ?Diagnosis Date  ? Acid reflux disease   ? Bipolar 1 disorder, mixed ([place])   ? BPH (benign prostatic hypertrophy)   ? CVA (cerebral vascular accident) ([place]) 07/2015  ? Gastritis   ? H/O hiatal hernia   ? Hyperlipidemia   ? Hypertension   ? Normal nuclear stress test Dec 2011  ? Obesity   ? TIA (transient ischemic attack) 05/15/2012  ? "they think I've had one this am @ 0130" (05/15/2012)  ? Type II diabetes mellitus ([place])   ? Vertigo   ? ? ?Past Surgical History:  ?Procedure Laterality Date  ? BILIARY STENT PLACEMENT N/A 09/17/2016  ? Procedure: BILIARY STENT PLACEMENT;  Surgeon: [name], MD;  [facility];  Service: Endoscopy;  Laterality: N/A;  ? CARDIOVASCULAR STRESS TEST  2009  ? EF 63%  ? CHOLECYSTECTOMY N/A 08/25/2016  ? Procedure: LAPAROSCOPIC CHOLECYSTECTOMY;  Surgeon: [name], MD;  Location: [facility];  Service: General;  Laterality: N/A;  ? COLONOSCOPY    ? COLONOSCOPY WITH PROPOFOL N/A 02/03/2021  ? Procedure: COLONOSCOPY WITH PROPOFOL;  Surgeon: Harvel Quale, MD;  [facility];  Service: Gastroenterology;  Laterality: N/A;    incomplete colonoscopy poor prep  ? COLONOSCOPY WITH PROPOFOL N/A 02/04/2021  ? Procedure: COLONOSCOPY WITH PROPOFOL;  Surgeon: Harvel Quale, MD;  [facility];  Service: Gastroenterology;  Laterality: N/A;  1:45  ? ERCP N/A 09/17/2016  ? Procedure: ENDOSCOPIC RETROGRADE CHOLANGIOPANCREATOGRAPHY (ERCP);  Surgeon: [name], MD;  [facility];  Service: Endoscopy;  Laterality: N/A;  730  ? ERCP N/A 12/03/2016  ? Procedure: ENDOSCOPIC RETROGRADE CHOLANGIOPANCREATOGRAPHY (ERCP);  Surgeon: [name], MD;  [facility];  Service: Gastroenterology;  Laterality: N/A;  ? ESOPHAGEAL DILATION  02/03/2021  ? Procedure: ESOPHAGEAL DILATION;  Surgeon: Montez Morita, Quillian Quince, MD;  [facility];  Service: Gastroenterology;;  savory 16 and 18 fr  ? ESOPHAGOGASTRODUODENOSCOPY    ? ESOPHAGOGASTRODUODENOSCOPY (EGD) WITH PROPOFOL N/A 02/03/2021  ? Procedure: ESOPHAGOGASTRODUODENOSCOPY (EGD) WITH PROPOFOL;  Surgeon: Harvel Quale, MD;  [facility];  Service: Gastroenterology;  Laterality: N/A;  ? GASTROINTESTINAL STENT REMOVAL N/A 12/03/2016  ? Procedure: BILIARY STENT REMOVAL;  Surgeon: [name], MD;  [facility];  Service: Gastroenterology;  Laterality: N/A;  ? NO PAST SURGERIES    ? POLYPECTOMY  02/04/2021  ? Procedure: POLYPECTOMY INTESTINAL;  Surgeon: Harvel Quale, MD;  [facility];  Service: Gastroenterology;;  ? SPHINCTEROTOMY N/A 09/17/2016  ? Procedure: SPHINCTEROTOMY;  Surgeon: [name], MD;  [facility];  Service: Endoscopy;  Laterality: N/A;  ? ? ?FAMHx:  ?Family History  ?Problem Relation Age of Onset  ? Hyperlipidemia Father   ?     in a nursing home  ? Colon cancer Father   ? Stroke Mother 63  ? ? ?SOCHx:  ? reports that he has never smoked. He has never used smokeless tobacco. He reports that he does not drink alcohol and does not use drugs. ? ?ALLERGIES:  ?No Known Allergies ? ?ROS: ?Pertinent items noted in HPI and remainder of comprehensive ROS otherwise negative. ? ?HOME MEDS: ?Current Outpatient Medications on File Prior to Visit  ?Medication Sig Dispense Refill  ? alfuzosin (UROXATRAL) 10 MG 24 hr tablet Take 1 tablet (10 mg total) by mouth daily. (Patient taking differently: Take 10 mg by mouth in the morning and at bedtime.) 30  tablet 2  ? Ascorbic Acid (VITAMIN C) 500 MG CAPS Take 500 mg by mouth daily.     ? aspirin EC 81 MG tablet Take 1 tablet (81 mg total) by mouth daily.    ? atorvastatin (LIPITOR) 80 MG tablet Take 1 tablet (80 mg total) by mouth daily. 90 tablet 1  ? b complex vitamins tablet Take 1 tablet by mouth daily.     ? cholecalciferol (VITAMIN D3) 25 MCG (1000 UNIT) tablet Take 2,000 Units by mouth daily.    ? CINNAMON PO Take 2,000 mg by mouth daily.    ? Coenzyme Q10 (COQ10) 200 MG CAPS Take 200 mg by mouth daily.    ? empagliflozin (JARDIANCE) 25 MG TABS tablet Take 1 tablet (25 mg total) by mouth daily before breakfast. 30 tablet 2  ? ezetimibe (ZETIA) 10 MG tablet Take 1 tablet (10 mg total) by mouth daily.  90 tablet 3  ? FLUoxetine (PROZAC) 40 MG capsule TAKE 1 CAPSULE( '40MG'$  TOTAL) BY MOUTH EVERY MORNING 90 capsule 1  ? Garlic 779 MG CAPS Take 500 mg by mouth daily.    ? insulin degludec (TRESIBA FLEXTOUCH) 100 UNIT/ML FlexTouch Pen Inject 55 Units into the skin daily. 15 mL 1  ? Insulin Pen Needle (PEN NEEDLES) 32G X 4 MM MISC Use as directed 100 each 2  ? lamoTRIgine (LAMICTAL) 150 MG tablet Take 1 tablet (150 mg total) by mouth 2 (two) times daily. 180 tablet 1  ? LORazepam (ATIVAN) 1 MG tablet TAKE 1 TABLET BY MOUTH THREE TIMES DAILY AS NEEDED FOR ANXIETY. GENERIC EQUIVALENT FOR ATIVAN. 270 tablet 1  ? Menthol-Methyl Salicylate (ARTHRITIS HOT) 10-15 % CREA Apply 1 application topically daily as needed (pain).    ? Menthol-Methyl Salicylate (ICY HOT) 39-03 % STCK Apply 1 application topically 3 (three) times daily as needed (pain).    ? metFORMIN (GLUCOPHAGE) 1000 MG tablet TAKE 1 TABLET BY MOUTH 2 TIMES DAILY WITH MEALS 180 tablet 1  ? metoprolol tartrate (LOPRESSOR) 25 MG tablet TAKE 1 TABLET BY MOUTH 2 TIMES DAILY GENERIC EQUIVALENT FOR LOPRESSOR. MAKE OVERDUE APPOINTMENT WITH MD NAHSER BEFORE ANYMORE REFILLS [phone]. 30 tablet 0  ? Multiple Vitamin (MULTIVITAMIN WITH MINERALS) TABS tablet Take 1 tablet by  mouth daily.    ? Omega-3 Fatty Acids (FISH OIL) 1000 MG CAPS Take 2,000 mg by mouth daily.    ? omeprazole (PRILOSEC) 40 MG capsule Take 1 capsule (40 mg total) by mouth daily. 90 capsule 3  ? ONETOUCH ULTR

## 2021-07-27 ENCOUNTER — Ambulatory Visit: Payer: Self-pay

## 2021-07-27 ENCOUNTER — Telehealth: Payer: Medicare Other

## 2021-07-27 ENCOUNTER — Telehealth: Payer: Self-pay

## 2021-07-27 DIAGNOSIS — E1122 Type 2 diabetes mellitus with diabetic chronic kidney disease: Secondary | ICD-10-CM

## 2021-07-27 DIAGNOSIS — I10 Essential (primary) hypertension: Secondary | ICD-10-CM

## 2021-07-27 DIAGNOSIS — E782 Mixed hyperlipidemia: Secondary | ICD-10-CM

## 2021-07-27 DIAGNOSIS — N1831 Chronic kidney disease, stage 3a: Secondary | ICD-10-CM

## 2021-07-27 NOTE — Telephone Encounter (Signed)
?  Care Management  ? ?Follow Up Note ? ? ?07/27/2021 ?Name: KYRE JEFFRIES MRN: [id number] DOB: 02/16/1956 ? ? ?Referred by: [name], FNP ?Reason for referral : Chronic Care Management (RN CM Follow up call ) ? ? ?An unsuccessful telephone outreach was attempted today. The patient was referred to the case management team for assistance with care management and care coordination.  ? ?Follow Up Plan: A HIPPA compliant phone message was left for the patient providing contact information and requesting a return call.  ? ?Barb Merino, RN, BSN, CCM ?Care Management Coordinator ?[place] [facility]  ?Direct Phone: [phone] ? ? ?

## 2021-07-28 NOTE — Chronic Care Management (AMB) (Signed)
?Chronic Care Management  ? ?CCM RN Visit Note ? ?07/27/2021 ?Name: Robert Walters MRN: [id number] DOB: November 14, 1955 ? ?Subjective: ?Robert Walters is a 66 y.o. year old male who is a primary care patient of [name], [place]. The care management team was consulted for assistance with disease management and care coordination needs.   ? ?Engaged with patient by telephone for follow up visit in response to provider referral for case management and/or care coordination services.  ? ?Consent to Services:  ?The patient was given information about Chronic Care Management services, agreed to services, and gave verbal consent prior to initiation of services.  Please see initial visit note for detailed documentation.  ? ?Patient agreed to services and verbal consent obtained.  ? ?Assessment: Review of patient past medical history, allergies, medications, health status, including review of consultants reports, laboratory and other test data, was performed as part of comprehensive evaluation and provision of chronic care management services.  ? ?SDOH (Social Determinants of Health) assessments and interventions performed:  Yes, no acute challenges  ? ?CCM Care Plan ? ?No Known Allergies ? ?Outpatient Encounter Medications as of 07/27/2021  ?Medication Sig  ? alfuzosin (UROXATRAL) 10 MG 24 hr tablet Take 1 tablet (10 mg total) by mouth daily. (Patient taking differently: Take 10 mg by mouth in the morning and at bedtime.)  ? Ascorbic Acid (VITAMIN C) 500 MG CAPS Take 500 mg by mouth daily.   ? aspirin EC 81 MG tablet Take 1 tablet (81 mg total) by mouth daily.  ? atorvastatin (LIPITOR) 80 MG tablet Take 1 tablet (80 mg total) by mouth daily.  ? b complex vitamins tablet Take 1 tablet by mouth daily.   ? cholecalciferol (VITAMIN D3) 25 MCG (1000 UNIT) tablet Take 2,000 Units by mouth daily.  ? CINNAMON PO Take 2,000 mg by mouth daily.  ? Coenzyme Q10 (COQ10) 200 MG CAPS Take 200 mg by mouth daily.  ? empagliflozin (JARDIANCE) 25 MG  TABS tablet Take 1 tablet (25 mg total) by mouth daily before breakfast.  ? ezetimibe (ZETIA) 10 MG tablet Take 1 tablet (10 mg total) by mouth daily.  ? FLUoxetine (PROZAC) 40 MG capsule TAKE 1 CAPSULE( '40MG'$  TOTAL) BY MOUTH EVERY MORNING  ? Garlic 195 MG CAPS Take 500 mg by mouth daily.  ? insulin degludec (TRESIBA FLEXTOUCH) 100 UNIT/ML FlexTouch Pen Inject 55 Units into the skin daily.  ? Insulin Pen Needle (PEN NEEDLES) 32G X 4 MM MISC Use as directed  ? lamoTRIgine (LAMICTAL) 150 MG tablet Take 1 tablet (150 mg total) by mouth 2 (two) times daily.  ? LORazepam (ATIVAN) 1 MG tablet TAKE 1 TABLET BY MOUTH THREE TIMES DAILY AS NEEDED FOR ANXIETY. GENERIC EQUIVALENT FOR ATIVAN.  ? Menthol-Methyl Salicylate (ARTHRITIS HOT) 10-15 % CREA Apply 1 application topically daily as needed (pain).  ? Menthol-Methyl Salicylate (ICY HOT) 09-32 % STCK Apply 1 application topically 3 (three) times daily as needed (pain).  ? metFORMIN (GLUCOPHAGE) 1000 MG tablet TAKE 1 TABLET BY MOUTH 2 TIMES DAILY WITH MEALS  ? metoprolol tartrate (LOPRESSOR) 25 MG tablet TAKE 1 TABLET BY MOUTH 2 TIMES DAILY GENERIC EQUIVALENT FOR LOPRESSOR. MAKE OVERDUE APPOINTMENT WITH MD NAHSER BEFORE ANYMORE REFILLS [phone].  ? Multiple Vitamin (MULTIVITAMIN WITH MINERALS) TABS tablet Take 1 tablet by mouth daily.  ? Omega-3 Fatty Acids (FISH OIL) 1000 MG CAPS Take 2,000 mg by mouth daily.  ? omeprazole (PRILOSEC) 40 MG capsule Take 1 capsule (40 mg total) by mouth  daily.  ? ONETOUCH ULTRA test strip USE 1 STRIP TO CHECK GLUCOSE THREE TIMES DAILY  ? Semaglutide (RYBELSUS) 14 MG TABS Take 1 tablet (14 mg total) by mouth daily at 6 (six) AM.  ? valsartan (DIOVAN) 320 MG tablet Take 1 tablet (320 mg total) by mouth daily.  ? ?No facility-administered encounter medications on file as of 07/27/2021.  ? ? ?Patient Active Problem List  ? Diagnosis Date Noted  ? NASH (nonalcoholic steatohepatitis) 11/20/2020  ? Dysphagia 11/20/2020  ? Chronic diastolic heart  failure ([place]) 03/27/2019  ? Fatty liver 12/26/2018  ? Elevated LFTs 12/26/2018  ? History of colonic polyps 12/26/2018  ? Fall 08/28/2018  ? Rib pain on left side 08/28/2018  ? Anxiety 03/31/2018  ? Bipolar II disorder ([place]) 03/31/2018  ? Insomnia 03/31/2018  ? Type 2 diabetes mellitus without complication, without long-term current use of insulin ([place]) 03/24/2018  ? Bile leak 09/15/2016  ? Sepsis ([place]) 08/23/2016  ? Cholecystitis   ? Stroke ([place]) 07/25/2015  ? 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 Dizziness 08/09/2014  ? TIA (transient ischemic attack) 05/15/2012  ? Blurred vision 05/15/2012  ? Ataxia 05/15/2012  ? Diabetes mellitus type 2 with complications ([place]) 10/62/6948  ? BPH (benign prostatic hyperplasia) 05/15/2012  ? GERD (gastroesophageal reflux disease) 05/15/2012  ? Obesity 05/15/2012  ? Bipolar 1 disorder ([place]) 05/15/2012  ? Essential hypertension 10/30/2010  ? Cardiovascular risk factor 10/30/2010  ? Pure hypercholesterolemia 12/05/2008  ? ? ?Conditions to be addressed/monitored: DMII, Essential Hypertension, Stage 3a CKD, Mixed Hyperlipidemia  ? ?Care Plan : RN Care Manager Plan of Care  ?Updates made by Lynne Logan, RN since 07/27/2021 12:00 AM  ?  ? ?Problem: No plan established for management of chronic disease states (DMII, Essential Hypertension, Stage 3a CKD, Mixed Hyperlipidemia)   ?Priority: High  ?  ? ?Long-Range Goal: Development of plan of care for chronic disease management DMII, Essential Hypertension, Stage 3a CKD, Mixed Hyperlipidemia   ?Start Date: 03/10/2021  ?Expected End Date: 03/10/2022  ?Recent Progress: On track  ?Priority: High  ?Note:   ?Current Barriers:  ?Knowledge Deficits related to plan of care for management of DMII, Essential Hypertension, Stage 3a CKD, Mixed Hyperlipidemia  ?Chronic Disease Management support and education needs related to DMII, Essential Hypertension, Stage 3a CKD, Mixed Hyperlipidemia  ? ?RNCM Clinical Goal(s):  ?Patient will verbalize basic  understanding of  DMII, Essential Hypertension, Stage 3a CKD, Mixed Hyperlipidemia  disease process and self health management plan   ?take all medications exactly as prescribed and will call provider for medication related questions ?demonstrate Improved health management independence   ?continue to work with RN Care Manager to address care management and care coordination needs related to  DMII, Essential Hypertension, Stage 3a CKD, Mixed Hyperlipidemia  ?will demonstrate ongoing self health care management ability    through collaboration with RN Care manager, provider, and care team.  ? ?Interventions: ?1:1 collaboration with primary care provider regarding development and update of comprehensive plan of care as evidenced by provider attestation and co-signature ?Inter-disciplinary care team collaboration (see longitudinal plan of care) ?Evaluation of current treatment plan related to  self management and patient's adherence to plan as established by provider ? ?Hyperlipidemia Interventions:  (Status:  Goal on track:  Yes.) Long Term Goal ?Review of patient status, including review of consultant's reports, relevant laboratory and other test results, and medications completed ?Medication review performed; medication list updated in electronic medical record.  ?Provider established cholesterol  goals reviewed ?Counseled on importance of regular laboratory monitoring as prescribed ?Reviewed importance of limiting foods high in cholesterol ?Reviewed exercise goals and target of 150 minutes per week ?Assessed social determinant of health barriers   ?Lipid Panel  ?   ?Component Value Date/Time  ? CHOL 116 07/17/2021 1047  ? CHOL 119 07/13/2021 1221  ? TRIG 193 (H) 07/17/2021 1047  ? HDL 34 (L) 07/17/2021 1047  ? HDL 37 (L) 07/13/2021 1221  ? CHOLHDL 3.4 07/17/2021 1047  ? VLDL 39 07/17/2021 1047  ? LDLCALC 43 07/17/2021 1047  ? LDLCALC 45 07/13/2021 1221  ? LDLDIRECT 124.3 11/10/2010 0953  ? LABVLDL 37 07/13/2021 1221   ?Diabetes Interventions:  (Status:  Goal on track:  Yes.) Long Term Goal ?Assessed patient's understanding of A1c goal: <7% ?Reviewed medications with patient and discussed importance of medication adherenc

## 2021-07-28 NOTE — Patient Instructions (Signed)
Visit Information ? ?Thank you for taking time to visit with me today. Please don't hesitate to contact me if I can be of assistance to you before our next scheduled telephone appointment. ? ?Following are the goals we discussed today:  ?(Copy and paste patient goals from clinical care plan here) ? ?Our next appointment is by telephone on 08/20/21 at 1:30 PM  ? ?Please call the care guide team at [phone] if you need to cancel or reschedule your appointment.  ? ?If you are experiencing a Mental Health or [place] or need someone to talk to, please call [phone] (toll free, 24 hour hotline)  ? ?The patient verbalized understanding of instructions, educational materials, and care plan provided today and agreed to receive a mailed copy of patient instructions, educational materials, and care plan.  ? ?Barb Merino, RN, BSN, CCM ?Care Management Coordinator ?[place] [facility]  ?Direct Phone: [phone] ? ? ?

## 2021-07-31 DIAGNOSIS — I129 Hypertensive chronic kidney disease with stage 1 through stage 4 chronic kidney disease, or unspecified chronic kidney disease: Secondary | ICD-10-CM

## 2021-07-31 DIAGNOSIS — N1831 Chronic kidney disease, stage 3a: Secondary | ICD-10-CM

## 2021-07-31 DIAGNOSIS — E782 Mixed hyperlipidemia: Secondary | ICD-10-CM | POA: Diagnosis not present

## 2021-07-31 DIAGNOSIS — Z794 Long term (current) use of insulin: Secondary | ICD-10-CM

## 2021-07-31 DIAGNOSIS — E1122 Type 2 diabetes mellitus with diabetic chronic kidney disease: Secondary | ICD-10-CM | POA: Diagnosis not present

## 2021-08-11 ENCOUNTER — Telehealth: Payer: Self-pay

## 2021-08-11 ENCOUNTER — Other Ambulatory Visit: Payer: Self-pay

## 2021-08-11 DIAGNOSIS — E119 Type 2 diabetes mellitus without complications: Secondary | ICD-10-CM

## 2021-08-11 DIAGNOSIS — N183 Chronic kidney disease, stage 3 unspecified: Secondary | ICD-10-CM

## 2021-08-11 MED ORDER — METFORMIN HCL 1000 MG PO TABS: ORAL_TABLET | ORAL | 1 refills | Status: DC

## 2021-08-11 NOTE — Chronic Care Management (AMB) (Addendum)
? ? ?Chronic Care Management ?Pharmacy Assistant  ? ?Name: Robert Walters  MRN: [id number] DOB: Jun 29, 1955 ? ? ?Reason for Encounter: Disease State/ Diabetes ? ?Recent office visits:  ?07-27-2021 Robert Walters, Robert Stapler, RN (CCM) ? ?07-14-2021 Robert Walters, Robert Stapler, RN (CCM) ? ?07-13-2021 Robert Walters, Robert Walters. Glucose= 139, Creatinine= 1.40, eGFR= 55, Alkaline= 172, ALT= 55. A1C= 7.2. Trig= 234, HDL= 37. ? ?05-20-2021 Robert Walters, Robert Stapler, RN (CCM) ? ?Recent consult visits:  ?07-23-2021 Robert Casino, MD (Cardiology). No changes. Follow up in 6 months. ? ?07-17-2021 Robert Casino, MD (Cardiology). Lipid labs completed. Trig= 193, HDL= 34. ? ?06-09-2021 Robert Walters (Psychiatry). Continue with medications. Follow up in 6 months. ? ?Hospital visits:  ?None in previous 6 months ? ?Medications: ?Outpatient Encounter Medications as of 08/11/2021  ?Medication Sig  ? alfuzosin (UROXATRAL) 10 MG 24 hr tablet Take 1 tablet (10 mg total) by mouth daily. (Patient taking differently: Take 10 mg by mouth in the morning and at bedtime.)  ? Ascorbic Acid (VITAMIN C) 500 MG CAPS Take 500 mg by mouth daily.   ? aspirin EC 81 MG tablet Take 1 tablet (81 mg total) by mouth daily.  ? atorvastatin (LIPITOR) 80 MG tablet Take 1 tablet (80 mg total) by mouth daily.  ? b complex vitamins tablet Take 1 tablet by mouth daily.   ? cholecalciferol (VITAMIN D3) 25 MCG (1000 UNIT) tablet Take 2,000 Units by mouth daily.  ? CINNAMON PO Take 2,000 mg by mouth daily.  ? Coenzyme Q10 (COQ10) 200 MG CAPS Take 200 mg by mouth daily.  ? empagliflozin (JARDIANCE) 25 MG TABS tablet Take 1 tablet (25 mg total) by mouth daily before breakfast.  ? ezetimibe (ZETIA) 10 MG tablet Take 1 tablet (10 mg total) by mouth daily.  ? FLUoxetine (PROZAC) 40 MG capsule TAKE 1 CAPSULE( 40MG TOTAL) BY MOUTH EVERY MORNING  ? Garlic 696 MG CAPS Take 500 mg by mouth daily.  ? insulin degludec (TRESIBA FLEXTOUCH) 100 UNIT/ML FlexTouch Pen Inject 55 Units into the skin daily.  ?  Insulin Pen Needle (PEN NEEDLES) 32G X 4 MM MISC Use as directed  ? lamoTRIgine (LAMICTAL) 150 MG tablet Take 1 tablet (150 mg total) by mouth 2 (two) times daily.  ? LORazepam (ATIVAN) 1 MG tablet TAKE 1 TABLET BY MOUTH THREE TIMES DAILY AS NEEDED FOR ANXIETY. GENERIC EQUIVALENT FOR ATIVAN.  ? Menthol-Methyl Salicylate (ARTHRITIS HOT) 10-15 % CREA Apply 1 application topically daily as needed (pain).  ? Menthol-Methyl Salicylate (ICY HOT) 78-93 % STCK Apply 1 application topically 3 (three) times daily as needed (pain).  ? metFORMIN (GLUCOPHAGE) 1000 MG tablet TAKE 1 TABLET BY MOUTH 2 TIMES DAILY WITH MEALS  ? metoprolol tartrate (LOPRESSOR) 25 MG tablet TAKE 1 TABLET BY MOUTH 2 TIMES DAILY GENERIC EQUIVALENT FOR LOPRESSOR. MAKE OVERDUE APPOINTMENT WITH MD NAHSER BEFORE ANYMORE REFILLS [phone].  ? Multiple Vitamin (MULTIVITAMIN WITH MINERALS) TABS tablet Take 1 tablet by mouth daily.  ? Omega-3 Fatty Acids (FISH OIL) 1000 MG CAPS Take 2,000 mg by mouth daily.  ? omeprazole (PRILOSEC) 40 MG capsule Take 1 capsule (40 mg total) by mouth daily.  ? ONETOUCH ULTRA test strip USE 1 STRIP TO CHECK GLUCOSE THREE TIMES DAILY  ? Semaglutide (RYBELSUS) 14 MG TABS Take 1 tablet (14 mg total) by mouth daily at 6 (six) AM.  ? valsartan (DIOVAN) 320 MG tablet Take 1 tablet (320 mg total) by mouth daily.  ? ?No facility-administered encounter medications on file  as of 08/11/2021.  ? ?Recent Relevant Labs: ?Lab Results  ?Component Value Date/Time  ? HGBA1C 7.2 (H) 07/13/2021 12:21 PM  ? HGBA1C 6.7 (H) 04/14/2021 10:49 AM  ? MICROALBUR 10 04/14/2021 11:45 AM  ? MICROALBUR 10 03/26/2020 01:16 PM  ?  ?Kidney Function ?Lab Results  ?Component Value Date/Time  ? CREATININE 1.40 (H) 07/13/2021 12:21 PM  ? CREATININE 1.42 (H) 04/14/2021 10:49 AM  ? CREATININE 1.33 (H) 10/19/2016 02:51 PM  ? GFRNONAA 50 (L) 03/26/2020 11:09 AM  ? GFRAA 58 (L) 03/26/2020 11:09 AM  ? ? ?Current antihyperglycemic regimen:  ?Rybelsus 14 mg tablet once per  day  ?Robert Walters FlexTouch - inject 50 units into the skin daily  ?Jardiance 25 mg tablet daily  ?Metformin 1000 mg twice daily (Patient stated taking once daily) ? ?What recent interventions/DTPs have been made to improve glycemic control:  ?Educated on A1c and blood sugar goals; ?Complications of diabetes including kidney damage, retinal damage, and cardiovascular disease; ?-Patient reports that his bottle said he had 2 refills left for Jardiance but he was not able to fill it.  ?-Counseled to check feet daily and get yearly eye exams ?- We discussed in detail the processes and steps that he would be taking to get his medication.  ? ?Have there been any recent hospitalizations or ED visits since last visit with CPP? No ? ?Patient denies hypoglycemic symptoms ? ?Patient denies hyperglycemic symptoms ? ?How often are you checking your blood sugar? once daily ? ?What are your blood sugars ranging?  ?Fasting: 155-165 ?Before meals: None ?After meals: None ?Bedtime: None ? ?During the week, how often does your blood glucose drop below 70? Never ? ?Are you checking your feet daily/regularly?  ? ?Adherence Review: ?Is the patient currently on a STATIN medication? Yes ?Is the patient currently on ACE/ARB medication? Yes ?Does the patient have >5 day gap between last estimated fill dates? Yes , Per HC, Robert Walters was taking Metformin one pill daily instead of two pills daily, he was updated on the directions and will begin to take 2 pills per day.  ? ? ?Care Gaps: ?Yearly ophthalmology overdue ?AWV 04-22-2022 ? ?Star Rating Drugs: ?Atorvastatin 40 mg- Last filled 07-20-2021 90 [facility] ?Valsartan 320 mg- Last filled 05-13-2021 90 [facility] ?Semaglutide 14 mg- Last filled 07-14-2021 [facility] ?Metformin 1000 mg- Last filled 09-24-2020 90 [facility] (Patient stated he has extra pills left. Contacted pharmacy and patient needs refills. Refill request sent to [facility].) ?Jardiance 25 mg- Patient  assistance ? ?Malecca Walters CMA ?Clinical Pharmacist Assistant ?[phone] ? ?

## 2021-08-18 ENCOUNTER — Ambulatory Visit: Payer: Medicare Other | Admitting: Cardiovascular Disease

## 2021-08-18 ENCOUNTER — Encounter: Payer: Self-pay | Admitting: Cardiovascular Disease

## 2021-08-18 VITALS — BP 130/78 | HR 85 | Ht 69.0 in | Wt 234.8 lb

## 2021-08-18 DIAGNOSIS — I5032 Chronic diastolic (congestive) heart failure: Secondary | ICD-10-CM | POA: Diagnosis not present

## 2021-08-18 DIAGNOSIS — I10 Essential (primary) hypertension: Secondary | ICD-10-CM

## 2021-08-18 MED ORDER — METOPROLOL TARTRATE 25 MG PO TABS
ORAL_TABLET | ORAL | 3 refills | Status: DC
Start: 2021-08-18 — End: 2022-06-23

## 2021-08-18 MED ORDER — VALSARTAN 320 MG PO TABS: 320.0000 mg | ORAL_TABLET | Freq: Every day | ORAL | 3 refills | Status: DC

## 2021-08-18 NOTE — Patient Instructions (Signed)
Medication Instructions:  ?Your physician recommends that you continue on your current medications as directed. Please refer to the Current Medication list given to you today. ? ?*If you need a refill on your cardiac medications before your next appointment, please call your pharmacy* ? ?Lab Work: ?NONE ?If you have labs (blood work) drawn today and your tests are completely normal, you will receive your results only by: ?MyChart Message (if you have MyChart) OR ?A paper copy in the mail ?If you have any lab test that is abnormal or we need to change your treatment, we will call you to review the results. ? ?Testing/Procedures: ?NONE ? ?Follow-Up: ?At [facility], you and your health needs are our priority.  As part of our continuing mission to provide you with exceptional heart care, we have created designated Provider Care Teams.  These Care Teams include your primary Cardiologist (physician) and Advanced Practice Providers (APPs -  Physician Assistants and Nurse Practitioners) who all work together to provide you with the care you need, when you need it. ? ?We recommend signing up for the patient portal called "MyChart".  Sign up information is provided on this After Visit Summary.  MyChart is used to connect with patients for Virtual Visits (Telemedicine).  Patients are able to view lab/test results, encounter notes, upcoming appointments, etc.  Non-urgent messages can be sent to your provider as well.   ?To learn more about what you can do with MyChart, go to [email or web address].   ? ?Your next appointment:   ?1 year(s) ? ?The format for your next appointment:   ?In Person ? ?Provider:   ?Lyman Bishop, MD ? ?Other Instructions ? ?Important Information About Sugar ? ? ? ? ?  ?

## 2021-08-18 NOTE — Progress Notes (Signed)
? ?Cardiology Office Note ? ? ?Date:  08/18/2021  ? ?ID:  Robert Walters, DOB 08/03/1955, MRN [id number] ? ?PCP:  [name], FNP  ?Cardiologist:   Mertie Moores, MD  ? ?Chief Complaint  ?Patient presents with  ? Hypertension  ?   ?  ? Congestive Heart Failure  ? ?Problem List: ?1. Hypertension ?2. Hyperlipidemia ?3.  Chronic diastolic congestive heart failure ?4. Obesity ?5. GERD ?6.   Diabetes mellitus - with gastroparesis ? ?Feb. 9, 2016: ? ?Robert Walters is a 66 y.o. male who presents for follow up of his HTN . ?He also has a history of hyperlipidemia that is controlled and monitored by his medical doctor. ? ?He does not eat restricted diet. He does not get any regular exercise. He's had some issues with depression and spends a lot of time in bed. ? ?Sept. 21, 2017: ? ?Robert Walters is seen back today for Further eval ?Had a stroke in March, was admitted to [facility] ?Echo showed normal LV function .  ?Mild MR ? ?Still eats lots of salt .  ? ?Does not exercise.  ? ?May 19, 2017: ? ?Robert Walters is seen today for follow-up visit.  He has a history of chronic diastolic congestive heart failure. ?Had gall bladder surgery this past year.  ?Breathing is ok.   ?Still eats salt.    ?Exercises only a little .   Is depressed.  ?Eats popcorn regularly  ? ?October 22, 2019: ? ?Robert Walters is seen for follow up  ?Wt is 223 today .  ?No cp or dyspnea.  ?Has cut back on his salt intake  ?No cp or dyspnea .  ?BP is on the low side today  ?BP is usually in the 130 - 140 range  ? ?August 18, 2021: ?Robert Walters is seen today for follow-up of his chronic diastolic congestive heart failure. ?Heart rate and blood pressure are well controlled. ?Has seen Dr. Debara Pickett for lipid management  ?He would like to transfer all of his cardiac care to Dr. Debara Pickett in [place] office if possible  ? ?Has cut out most of his carbs ?Needs to exercise more  ? ?Past Medical History:  ?Diagnosis Date  ? Acid reflux disease   ? Bipolar 1 disorder, mixed ([place])   ? BPH  (benign prostatic hypertrophy)   ? CVA (cerebral vascular accident) ([place]) 07/2015  ? Gastritis   ? H/O hiatal hernia   ? Hyperlipidemia   ? Hypertension   ? Normal nuclear stress test Dec 2011  ? Obesity   ? TIA (transient ischemic attack) 05/15/2012  ? "they think I've had one this am @ 0130" (05/15/2012)  ? Type II diabetes mellitus ([place])   ? Vertigo   ? ? ?Past Surgical History:  ?Procedure Laterality Date  ? BILIARY STENT PLACEMENT N/A 09/17/2016  ? Procedure: BILIARY STENT PLACEMENT;  Surgeon: [name], MD;  Location: [facility] END[facility] SU[facility];  Service: Endoscopy;  Laterality: N/A;  ? CARDIOVASCULAR STRESS TEST  2009  ? EF 63%  ? CHOLECYSTECTOMY N/A 08/25/2016  ? Procedure: LAPAROSCOPIC CHOLECYSTECTOMY;  Surgeon: [name], MD;  Location: [facility] ORS;  Service: General;  Laterality: N/A;  ? COLONOSCOPY    ? COLONOSCOPY WITH PROPOFOL N/A 02/03/2021  ? Procedure: COLONOSCOPY WITH PROPOFOL;  Surgeon: Harvel Quale, MD;  Location: [facility];  Service: Gastroenterology;  Laterality: N/A;    incomplete colonoscopy poor prep  ? COLONOSCOPY WITH PROPOFOL N/A 02/04/2021  ? Procedure: COLONOSCOPY WITH PROPOFOL;  Surgeon:  Harvel Quale, MD;  Location: [facility];  Service: Gastroenterology;  Laterality: N/A;  1:45  ? ERCP N/A 09/17/2016  ? Procedure: ENDOSCOPIC RETROGRADE CHOLANGIOPANCREATOGRAPHY (ERCP);  Surgeon: [name], MD;  Location: [facility];  Service: Endoscopy;  Laterality: N/A;  730  ? ERCP N/A 12/03/2016  ? Procedure: ENDOSCOPIC RETROGRADE CHOLANGIOPANCREATOGRAPHY (ERCP);  Surgeon: [name], MD;  Location: [facility];  Service: Gastroenterology;  Laterality: N/A;  ? ESOPHAGEAL DILATION  02/03/2021  ? Procedure: ESOPHAGEAL DILATION;  Surgeon: Montez Morita, Quillian Quince, MD;  Location: [facility];  Service: Gastroenterology;;  savory 16 and 18 fr  ? ESOPHAGOGASTRODUODENOSCOPY    ? ESOPHAGOGASTRODUODENOSCOPY (EGD) WITH PROPOFOL N/A 02/03/2021  ? Procedure:  ESOPHAGOGASTRODUODENOSCOPY (EGD) WITH PROPOFOL;  Surgeon: Harvel Quale, MD;  [facility];  Service: Gastroenterology;  Laterality: N/A;  ? GASTROINTESTINAL STENT REMOVAL N/A 12/03/2016  ? Procedure: BILIARY STENT REMOVAL;  Surgeon: [name], MD;  [facility];  Service: Gastroenterology;  Laterality: N/A;  ? NO PAST SURGERIES    ? POLYPECTOMY  02/04/2021  ? Procedure: POLYPECTOMY INTESTINAL;  Surgeon: Harvel Quale, MD;  [facility];  Service: Gastroenterology;;  ? SPHINCTEROTOMY N/A 09/17/2016  ? Procedure: SPHINCTEROTOMY;  Surgeon: [name], MD;  [facility];  Service: Endoscopy;  Laterality: N/A;  ? ? ? ?Current Outpatient Medications  ?Medication Sig Dispense Refill  ? Ascorbic Acid (VITAMIN C) 500 MG CAPS Take 500 mg by mouth daily.     ? aspirin EC 81 MG tablet Take 1 tablet (81 mg total) by mouth daily.    ? atorvastatin (LIPITOR) 80 MG tablet Take 1 tablet (80 mg total) by mouth daily. 90 tablet 1  ? b complex vitamins tablet Take 1 tablet by mouth daily.     ? cholecalciferol (VITAMIN D3) 25 MCG (1000 UNIT) tablet Take 2,000 Units by mouth daily.    ? CINNAMON PO Take 2,000 mg by mouth daily.    ? Coenzyme Q10 (COQ10) 200 MG CAPS Take 200 mg by mouth daily.    ? empagliflozin (JARDIANCE) 25 MG TABS tablet Take 1 tablet (25 mg total) by mouth daily before breakfast. 30 tablet 2  ? ezetimibe (ZETIA) 10 MG tablet Take 1 tablet (10 mg total) by mouth daily. 90 tablet 3  ? FLUoxetine (PROZAC) 40 MG capsule TAKE 1 CAPSULE( '40MG'$  TOTAL) BY MOUTH EVERY MORNING 90 capsule 1  ? Garlic 793 MG CAPS Take 500 mg by mouth daily.    ? insulin degludec (TRESIBA FLEXTOUCH) 100 UNIT/ML FlexTouch Pen Inject 55 Units into the skin daily. 15 mL 1  ? Insulin Pen Needle (PEN NEEDLES) 32G X 4 MM MISC Use as directed 100 each 2  ? lamoTRIgine (LAMICTAL) 150 MG tablet Take 1 tablet (150 mg total) by mouth 2 (two) times daily. 180 tablet 1  ? LORazepam  (ATIVAN) 1 MG tablet TAKE 1 TABLET BY MOUTH THREE TIMES DAILY AS NEEDED FOR ANXIETY. GENERIC EQUIVALENT FOR ATIVAN. 270 tablet 1  ? Menthol-Methyl Salicylate (ICY HOT) 90-30 % STCK Apply 1 application topically 3 (three) times daily as needed (pain).    ? metFORMIN (GLUCOPHAGE) 1000 MG tablet TAKE 1 TABLET BY MOUTH 2 TIMES DAILY WITH MEALS 180 tablet 1  ? metoprolol tartrate (LOPRESSOR) 25 MG tablet TAKE 1 TABLET BY MOUTH 2 TIMES DAILY GENERIC EQUIVALENT FOR LOPRESSOR. MAKE OVERDUE APPOINTMENT WITH MD Sholanda Croson BEFORE ANYMORE REFILLS [phone]. 30 tablet 0  ? Multiple  Vitamin (MULTIVITAMIN WITH MINERALS) TABS tablet Take 1 tablet by mouth daily.    ? Omega-3 Fatty Acids (FISH OIL) 1000 MG CAPS Take 2,000 mg by mouth daily.    ? omeprazole (PRILOSEC) 40 MG capsule Take 1 capsule (40 mg total) by mouth daily. 90 capsule 3  ? ONETOUCH ULTRA test strip USE 1 STRIP TO CHECK GLUCOSE THREE TIMES DAILY 100 each 0  ? Semaglutide (RYBELSUS) 14 MG TABS Take 1 tablet (14 mg total) by mouth daily at 6 (six) AM. 90 tablet 0  ? valsartan (DIOVAN) 320 MG tablet Take 1 tablet (320 mg total) by mouth daily. 90 tablet 3  ? alfuzosin (UROXATRAL) 10 MG 24 hr tablet Take 1 tablet (10 mg total) by mouth daily. (Patient not taking: Reported on 08/18/2021) 30 tablet 2  ? Menthol-Methyl Salicylate (ARTHRITIS HOT) 10-15 % CREA Apply 1 application topically daily as needed (pain). (Patient not taking: Reported on 08/18/2021)    ? ?No current facility-administered medications for this visit.  ? ? ?Allergies:   Patient has no known allergies.  ? ? ?Social History:  The patient  reports that he has never smoked. He has never used smokeless tobacco. He reports that he does not drink alcohol and does not use drugs.  ? ?Family History:  The patient's family history includes Colon cancer in his father; Hyperlipidemia in his father; Stroke (age of onset: 92) in his mother.  ? ? ?ROS:  ? ?Physical Exam: ?Blood pressure 130/78, pulse 85, height '5\' 9"'$   (1.753 m), weight 234 lb 12.8 oz (106.5 kg), SpO2 96 %. ? ?GEN:  Well nourished, well developed in no acute distress ?HEENT: Normal ?NECK: No JVD; No carotid bruits ?LYMPHATICS: No lymphadenopathy ?CARDIAC: RRR , no murmurs,

## 2021-08-19 ENCOUNTER — Telehealth: Payer: Medicare Other

## 2021-08-20 ENCOUNTER — Telehealth: Payer: Medicare Other

## 2021-08-20 ENCOUNTER — Ambulatory Visit (INDEPENDENT_AMBULATORY_CARE_PROVIDER_SITE_OTHER): Payer: Medicare Other

## 2021-08-20 DIAGNOSIS — N1831 Chronic kidney disease, stage 3a: Secondary | ICD-10-CM

## 2021-08-20 DIAGNOSIS — E782 Mixed hyperlipidemia: Secondary | ICD-10-CM

## 2021-08-20 DIAGNOSIS — I10 Essential (primary) hypertension: Secondary | ICD-10-CM

## 2021-08-21 NOTE — Chronic Care Management (AMB) (Signed)
?Chronic Care Management  ? ?CCM RN Visit Note ? ?08/20/2021 ?Name: Robert Walters MRN: [id number] DOB: 02-27-1956 ? ?Subjective: ?Robert Walters is a 66 y.o. year old male who is a primary care patient of [name], [place]. The care management team was consulted for assistance with disease management and care coordination needs.   ? ?Engaged with patient by telephone for follow up visit in response to provider referral for case management and/or care coordination services.  ? ?Consent to Services:  ?The patient was given information about Chronic Care Management services, agreed to services, and gave verbal consent prior to initiation of services.  Please see initial visit note for detailed documentation.  ? ?Patient agreed to services and verbal consent obtained.  ? ?Assessment: Review of patient past medical history, allergies, medications, health status, including review of consultants reports, laboratory and other test data, was performed as part of comprehensive evaluation and provision of chronic care management services.  ? ?SDOH (Social Determinants of Health) assessments and interventions performed:  Yes, no acute changes  ? ?CCM Care Plan ? ?No Known Allergies ? ?Outpatient Encounter Medications as of 08/20/2021  ?Medication Sig  ? alfuzosin (UROXATRAL) 10 MG 24 hr tablet Take 1 tablet (10 mg total) by mouth daily. (Patient not taking: Reported on 08/18/2021)  ? Ascorbic Acid (VITAMIN C) 500 MG CAPS Take 500 mg by mouth daily.   ? aspirin EC 81 MG tablet Take 1 tablet (81 mg total) by mouth daily.  ? atorvastatin (LIPITOR) 80 MG tablet Take 1 tablet (80 mg total) by mouth daily.  ? b complex vitamins tablet Take 1 tablet by mouth daily.   ? cholecalciferol (VITAMIN D3) 25 MCG (1000 UNIT) tablet Take 2,000 Units by mouth daily.  ? CINNAMON PO Take 2,000 mg by mouth daily.  ? Coenzyme Q10 (COQ10) 200 MG CAPS Take 200 mg by mouth daily.  ? empagliflozin (JARDIANCE) 25 MG TABS tablet Take 1 tablet (25 mg total)  by mouth daily before breakfast.  ? ezetimibe (ZETIA) 10 MG tablet Take 1 tablet (10 mg total) by mouth daily.  ? FLUoxetine (PROZAC) 40 MG capsule TAKE 1 CAPSULE( '40MG'$  TOTAL) BY MOUTH EVERY MORNING  ? Garlic 235 MG CAPS Take 500 mg by mouth daily.  ? insulin degludec (TRESIBA FLEXTOUCH) 100 UNIT/ML FlexTouch Pen Inject 55 Units into the skin daily.  ? Insulin Pen Needle (PEN NEEDLES) 32G X 4 MM MISC Use as directed  ? lamoTRIgine (LAMICTAL) 150 MG tablet Take 1 tablet (150 mg total) by mouth 2 (two) times daily.  ? LORazepam (ATIVAN) 1 MG tablet TAKE 1 TABLET BY MOUTH THREE TIMES DAILY AS NEEDED FOR ANXIETY. GENERIC EQUIVALENT FOR ATIVAN.  ? Menthol-Methyl Salicylate (ARTHRITIS HOT) 10-15 % CREA Apply 1 application topically daily as needed (pain). (Patient not taking: Reported on 08/18/2021)  ? Menthol-Methyl Salicylate (ICY HOT) 57-32 % STCK Apply 1 application topically 3 (three) times daily as needed (pain).  ? metFORMIN (GLUCOPHAGE) 1000 MG tablet TAKE 1 TABLET BY MOUTH 2 TIMES DAILY WITH MEALS  ? metoprolol tartrate (LOPRESSOR) 25 MG tablet TAKE 1 TABLET BY MOUTH 2 TIMES DAILY GENERIC EQUIVALENT FOR LOPRESSOR.  ? Multiple Vitamin (MULTIVITAMIN WITH MINERALS) TABS tablet Take 1 tablet by mouth daily.  ? Omega-3 Fatty Acids (FISH OIL) 1000 MG CAPS Take 2,000 mg by mouth daily.  ? omeprazole (PRILOSEC) 40 MG capsule Take 1 capsule (40 mg total) by mouth daily.  ? ONETOUCH ULTRA test strip USE 1 STRIP TO CHECK  GLUCOSE THREE TIMES DAILY  ? Semaglutide (RYBELSUS) 14 MG TABS Take 1 tablet (14 mg total) by mouth daily at 6 (six) AM.  ? valsartan (DIOVAN) 320 MG tablet Take 1 tablet (320 mg total) by mouth daily.  ? ?No facility-administered encounter medications on file as of 08/20/2021.  ? ? ?Patient Active Problem List  ? Diagnosis Date Noted  ? NASH (nonalcoholic steatohepatitis) 11/20/2020  ? Dysphagia 11/20/2020  ? Chronic diastolic heart failure ([place]) 03/27/2019  ? Fatty liver 12/26/2018  ? Elevated LFTs  12/26/2018  ? History of colonic polyps 12/26/2018  ? Fall 08/28/2018  ? Rib pain on left side 08/28/2018  ? Anxiety 03/31/2018  ? Bipolar II disorder ([place]) 03/31/2018  ? Insomnia 03/31/2018  ? Type 2 diabetes mellitus without complication, without long-term current use of insulin ([place]) 03/24/2018  ? Bile leak 09/15/2016  ? Sepsis ([place]) 08/23/2016  ? Cholecystitis   ? Stroke ([place]) 07/25/2015  ? 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 Dizziness 08/09/2014  ? TIA (transient ischemic attack) 05/15/2012  ? Blurred vision 05/15/2012  ? Ataxia 05/15/2012  ? Diabetes mellitus type 2 with complications ([place]) 19/50/9326  ? BPH (benign prostatic hyperplasia) 05/15/2012  ? GERD (gastroesophageal reflux disease) 05/15/2012  ? Obesity 05/15/2012  ? Bipolar 1 disorder ([place]) 05/15/2012  ? Essential hypertension 10/30/2010  ? Cardiovascular risk factor 10/30/2010  ? Pure hypercholesterolemia 12/05/2008  ? ? ?Conditions to be addressed/monitored: DMII, Essential Hypertension, Stage 3a CKD, Mixed Hyperlipidemia  ? ?Care Plan : RN Care Manager Plan of Care  ?Updates made by Lynne Logan, RN since 08/20/2021 12:00 AM  ?  ? ?Problem: No plan established for management of chronic disease states (DMII, Essential Hypertension, Stage 3a CKD, Mixed Hyperlipidemia)   ?Priority: High  ?  ? ?Long-Range Goal: Development of plan of care for chronic disease management DMII, Essential Hypertension, Stage 3a CKD, Mixed Hyperlipidemia   ?Start Date: 03/10/2021  ?Expected End Date: 03/10/2022  ?Recent Progress: On track  ?Priority: High  ?Note:   ?Current Barriers:  ?Knowledge Deficits related to plan of care for management of DMII, Essential Hypertension, Stage 3a CKD, Mixed Hyperlipidemia  ?Chronic Disease Management support and education needs related to DMII, Essential Hypertension, Stage 3a CKD, Mixed Hyperlipidemia  ? ?RNCM Clinical Goal(s):  ?Patient will verbalize basic understanding of  DMII, Essential Hypertension, Stage 3a CKD, Mixed  Hyperlipidemia  disease process and self health management plan   ?take all medications exactly as prescribed and will call provider for medication related questions ?demonstrate Improved health management independence   ?continue to work with RN Care Manager to address care management and care coordination needs related to  DMII, Essential Hypertension, Stage 3a CKD, Mixed Hyperlipidemia  ?will demonstrate ongoing self health care management ability    through collaboration with RN Care manager, provider, and care team.  ? ?Interventions: ?1:1 collaboration with primary care provider regarding development and update of comprehensive plan of care as evidenced by provider attestation and co-signature ?Inter-disciplinary care team collaboration (see longitudinal plan of care) ?Evaluation of current treatment plan related to  self management and patient's adherence to plan as established by provider ? ?Hyperlipidemia Interventions:  (Status:  Goal on track:  Yes.) Long Term Goal ?Medication review performed; medication list updated in electronic medical record.  ?Provider established cholesterol goals reviewed ?Counseled on importance of regular laboratory monitoring as prescribed ?Provided HLD educational materials ?Reviewed role and benefits of statin for ASCVD risk reduction ?Reviewed importance of limiting foods  high in cholesterol ?Reviewed exercise goals and target of 150 minutes per week ?Assessed social determinant of health barriers  ?Lipid Panel  ?   ?Component Value Date/Time  ? CHOL 116 07/17/2021 1047  ? CHOL 119 07/13/2021 1221  ? TRIG 193 (H) 07/17/2021 1047  ? HDL 34 (L) 07/17/2021 1047  ? HDL 37 (L) 07/13/2021 1221  ? CHOLHDL 3.4 07/17/2021 1047  ? VLDL 39 07/17/2021 1047  ? LDLCALC 43 07/17/2021 1047  ? LDLCALC 45 07/13/2021 1221  ? LDLDIRECT 124.3 11/10/2010 0953  ? LABVLDL 37 07/13/2021 1221  ? ?Diabetes Interventions:  (Status:  Goal on track:  Yes.) Long Term Goal ?Assessed patient's understanding  of A1c goal: <7% ?Provided education to patient about basic DM disease process ?Reviewed medications with patient and discussed importance of medication adherence ?Counseled on importance of regular laboratory m

## 2021-08-21 NOTE — Patient Instructions (Signed)
Visit Information ? ?Thank you for taking time to visit with me today. Please don't hesitate to contact me if I can be of assistance to you before our next scheduled telephone appointment. ? ?Following are the goals we discussed today:  ?(Copy and paste patient goals from clinical care plan here) ? ?Our next appointment is by telephone on 11/18/21 at 11:15 AM  ? ?Please call the care guide team at [phone] if you need to cancel or reschedule your appointment.  ? ?If you are experiencing a Mental Health or [place] or need someone to talk to, please call [phone]-TALK (toll free, 24 hour hotline)  ? ?The patient verbalized understanding of instructions, educational materials, and care plan provided today and agreed to receive a mailed copy of patient instructions, educational materials, and care plan.  ? ?Barb Merino, RN, BSN, CCM ?Care Management Coordinator ?[place] [facility]  ?Direct Phone: [phone] ? ?

## 2021-08-24 ENCOUNTER — Other Ambulatory Visit: Payer: Self-pay | Admitting: Nurse Practitioner

## 2021-08-24 DIAGNOSIS — E1165 Type 2 diabetes mellitus with hyperglycemia: Secondary | ICD-10-CM

## 2021-08-30 DIAGNOSIS — E1122 Type 2 diabetes mellitus with diabetic chronic kidney disease: Secondary | ICD-10-CM

## 2021-08-30 DIAGNOSIS — I10 Essential (primary) hypertension: Secondary | ICD-10-CM

## 2021-08-30 DIAGNOSIS — Z794 Long term (current) use of insulin: Secondary | ICD-10-CM

## 2021-08-30 DIAGNOSIS — E782 Mixed hyperlipidemia: Secondary | ICD-10-CM | POA: Diagnosis not present

## 2021-08-30 DIAGNOSIS — N1831 Chronic kidney disease, stage 3a: Secondary | ICD-10-CM | POA: Diagnosis not present

## 2021-09-01 ENCOUNTER — Ambulatory Visit (INDEPENDENT_AMBULATORY_CARE_PROVIDER_SITE_OTHER): Payer: Medicare Other

## 2021-09-01 ENCOUNTER — Other Ambulatory Visit: Payer: Self-pay | Admitting: Nurse Practitioner

## 2021-09-01 ENCOUNTER — Telehealth: Payer: Medicare Other

## 2021-09-01 ENCOUNTER — Telehealth: Payer: Self-pay

## 2021-09-01 DIAGNOSIS — N1831 Chronic kidney disease, stage 3a: Secondary | ICD-10-CM

## 2021-09-01 DIAGNOSIS — I10 Essential (primary) hypertension: Secondary | ICD-10-CM

## 2021-09-01 DIAGNOSIS — E782 Mixed hyperlipidemia: Secondary | ICD-10-CM

## 2021-09-01 DIAGNOSIS — Z794 Long term (current) use of insulin: Secondary | ICD-10-CM

## 2021-09-01 MED ORDER — RYBELSUS 14 MG PO TABS: 14.0000 mg | ORAL_TABLET | Freq: Every day | ORAL | 0 refills | Status: AC

## 2021-09-01 NOTE — Patient Instructions (Signed)
Visit Information ? ?Thank you for taking time to visit with me today. Please don't hesitate to contact me if I can be of assistance to you before our next scheduled telephone appointment. ? ?Following are the goals we discussed today:  ?(Copy and paste patient goals from clinical care plan here) ? ?Our next appointment is by telephone on 11/18/21 at 11:15 AM ? ?Please call the care guide team at [phone] if you need to cancel or reschedule your appointment.  ? ?If you are experiencing a Mental Health or [place] or need someone to talk to, please call [phone]-TALK (toll free, 24 hour hotline)  ? ?Patient verbalizes understanding of instructions and care plan provided today and agrees to view in [place]. Active MyChart status confirmed with patient.   ? ?Barb Merino, RN, BSN, CCM ?Care Management Coordinator ?[place] [facility]  ?Direct Phone: [phone] ? ? ?

## 2021-09-01 NOTE — Chronic Care Management (AMB) (Signed)
?Chronic Care Management  ? ?CCM RN Visit Note ? ?09/01/2021 ?Name: Robert Walters MRN: [id number] DOB: June 11, 1955 ? ?Subjective: ?Robert Walters is a 66 y.o. year old male who is a primary care patient of [name], [place]. The care management team was consulted for assistance with disease management and care coordination needs.   ? ?Engaged with patient by telephone for follow up visit in response to provider referral for case management and/or care coordination services.  ? ?Consent to Services:  ?The patient was given information about Chronic Care Management services, agreed to services, and gave verbal consent prior to initiation of services.  Please see initial visit note for detailed documentation.  ? ?Patient agreed to services and verbal consent obtained.  ? ?Assessment: Review of patient past medical history, allergies, medications, health status, including review of consultants reports, laboratory and other test data, was performed as part of comprehensive evaluation and provision of chronic care management services.  ? ?SDOH (Social Determinants of Health) assessments and interventions performed:  Yes, no acute needs ? ?CCM Care Plan ? ?No Known Allergies ? ?Outpatient Encounter Medications as of 09/01/2021  ?Medication Sig  ? alfuzosin (UROXATRAL) 10 MG 24 hr tablet Take 1 tablet (10 mg total) by mouth daily. (Patient not taking: Reported on 08/18/2021)  ? Ascorbic Acid (VITAMIN C) 500 MG CAPS Take 500 mg by mouth daily.   ? aspirin EC 81 MG tablet Take 1 tablet (81 mg total) by mouth daily.  ? atorvastatin (LIPITOR) 80 MG tablet Take 1 tablet (80 mg total) by mouth daily.  ? b complex vitamins tablet Take 1 tablet by mouth daily.   ? cholecalciferol (VITAMIN D3) 25 MCG (1000 UNIT) tablet Take 2,000 Units by mouth daily.  ? CINNAMON PO Take 2,000 mg by mouth daily.  ? Coenzyme Q10 (COQ10) 200 MG CAPS Take 200 mg by mouth daily.  ? empagliflozin (JARDIANCE) 25 MG TABS tablet Take 1 tablet (25 mg total) by  mouth daily before breakfast.  ? ezetimibe (ZETIA) 10 MG tablet Take 1 tablet (10 mg total) by mouth daily.  ? FLUoxetine (PROZAC) 40 MG capsule TAKE 1 CAPSULE( '40MG'$  TOTAL) BY MOUTH EVERY MORNING  ? Garlic 469 MG CAPS Take 500 mg by mouth daily.  ? insulin degludec (TRESIBA FLEXTOUCH) 100 UNIT/ML FlexTouch Pen Inject 55 Units into the skin daily.  ? Insulin Pen Needle (PEN NEEDLES) 32G X 4 MM MISC Use as directed  ? lamoTRIgine (LAMICTAL) 150 MG tablet Take 1 tablet (150 mg total) by mouth 2 (two) times daily.  ? LORazepam (ATIVAN) 1 MG tablet TAKE 1 TABLET BY MOUTH THREE TIMES DAILY AS NEEDED FOR ANXIETY. GENERIC EQUIVALENT FOR ATIVAN.  ? Menthol-Methyl Salicylate (ARTHRITIS HOT) 10-15 % CREA Apply 1 application topically daily as needed (pain). (Patient not taking: Reported on 08/18/2021)  ? Menthol-Methyl Salicylate (ICY HOT) 62-95 % STCK Apply 1 application topically 3 (three) times daily as needed (pain).  ? metFORMIN (GLUCOPHAGE) 1000 MG tablet TAKE 1 TABLET BY MOUTH 2 TIMES DAILY WITH MEALS  ? metoprolol tartrate (LOPRESSOR) 25 MG tablet TAKE 1 TABLET BY MOUTH 2 TIMES DAILY GENERIC EQUIVALENT FOR LOPRESSOR.  ? Multiple Vitamin (MULTIVITAMIN WITH MINERALS) TABS tablet Take 1 tablet by mouth daily.  ? Omega-3 Fatty Acids (FISH OIL) 1000 MG CAPS Take 2,000 mg by mouth daily.  ? omeprazole (PRILOSEC) 40 MG capsule Take 1 capsule (40 mg total) by mouth daily.  ? ONETOUCH ULTRA test strip USE 1 STRIP TO CHECK GLUCOSE  THREE TIMES DAILY  ? valsartan (DIOVAN) 320 MG tablet Take 1 tablet (320 mg total) by mouth daily.  ? [DISCONTINUED] Semaglutide (RYBELSUS) 14 MG TABS Take 1 tablet (14 mg total) by mouth daily at 6 (six) AM.  ? ?No facility-administered encounter medications on file as of 09/01/2021.  ? ? ?Patient Active Problem List  ? Diagnosis Date Noted  ? NASH (nonalcoholic steatohepatitis) 11/20/2020  ? Dysphagia 11/20/2020  ? Chronic diastolic heart failure ([place]) 03/27/2019  ? Fatty liver 12/26/2018  ? Elevated  LFTs 12/26/2018  ? History of colonic polyps 12/26/2018  ? Fall 08/28/2018  ? Rib pain on left side 08/28/2018  ? Anxiety 03/31/2018  ? Bipolar II disorder ([place]) 03/31/2018  ? Insomnia 03/31/2018  ? Type 2 diabetes mellitus without complication, without long-term current use of insulin ([place]) 03/24/2018  ? Bile leak 09/15/2016  ? Sepsis ([place]) 08/23/2016  ? Cholecystitis   ? Stroke ([place]) 07/25/2015  ? 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 Dizziness 08/09/2014  ? TIA (transient ischemic attack) 05/15/2012  ? Blurred vision 05/15/2012  ? Ataxia 05/15/2012  ? Diabetes mellitus type 2 with complications ([place]) 07/22/2246  ? BPH (benign prostatic hyperplasia) 05/15/2012  ? GERD (gastroesophageal reflux disease) 05/15/2012  ? Obesity 05/15/2012  ? Bipolar 1 disorder ([place]) 05/15/2012  ? Essential hypertension 10/30/2010  ? Cardiovascular risk factor 10/30/2010  ? Pure hypercholesterolemia 12/05/2008  ? ? ?Conditions to be addressed/monitored: DMII, Essential Hypertension, Stage 3a CKD, Mixed Hyperlipidemia  ? ?Care Plan : RN Care Manager Plan of Care  ?Updates made by Lynne Logan, RN since 09/01/2021 12:00 AM  ?  ? ?Problem: No plan established for management of chronic disease states (DMII, Essential Hypertension, Stage 3a CKD, Mixed Hyperlipidemia)   ?Priority: High  ?  ? ?Long-Range Goal: Development of plan of care for chronic disease management DMII, Essential Hypertension, Stage 3a CKD, Mixed Hyperlipidemia   ?Start Date: 03/10/2021  ?Expected End Date: 03/10/2022  ?Recent Progress: On track  ?Priority: High  ?Note:   ?Current Barriers:  ?Knowledge Deficits related to plan of care for management of DMII, Essential Hypertension, Stage 3a CKD, Mixed Hyperlipidemia  ?Chronic Disease Management support and education needs related to DMII, Essential Hypertension, Stage 3a CKD, Mixed Hyperlipidemia  ? ?RNCM Clinical Goal(s):  ?Patient will verbalize basic understanding of  DMII, Essential Hypertension, Stage 3a CKD, Mixed  Hyperlipidemia  disease process and self health management plan   ?take all medications exactly as prescribed and will call provider for medication related questions ?demonstrate Improved health management independence   ?continue to work with RN Care Manager to address care management and care coordination needs related to  DMII, Essential Hypertension, Stage 3a CKD, Mixed Hyperlipidemia  ?will demonstrate ongoing self health care management ability    through collaboration with RN Care manager, provider, and care team.  ? ?Interventions: ?1:1 collaboration with primary care provider regarding development and update of comprehensive plan of care as evidenced by provider attestation and co-signature ?Inter-disciplinary care team collaboration (see longitudinal plan of care) ?Evaluation of current treatment plan related to  self management and patient's adherence to plan as established by provider ? ?Hyperlipidemia Interventions:  (Status:  Condition stable.  Not addressed this visit.) Long Term Goal ?Medication review performed; medication list updated in electronic medical record.  ?Provider established cholesterol goals reviewed ?Counseled on importance of regular laboratory monitoring as prescribed ?Provided HLD educational materials ?Reviewed role and benefits of statin for ASCVD risk reduction ?Reviewed importance of  limiting foods high in cholesterol ?Reviewed exercise goals and target of 150 minutes per week ?Assessed social determinant of health barriers  ?Lipid Panel  ?   ?Component Value Date/Time  ? CHOL 116 07/17/2021 1047  ? CHOL 119 07/13/2021 1221  ? TRIG 193 (H) 07/17/2021 1047  ? HDL 34 (L) 07/17/2021 1047  ? HDL 37 (L) 07/13/2021 1221  ? CHOLHDL 3.4 07/17/2021 1047  ? VLDL 39 07/17/2021 1047  ? LDLCALC 43 07/17/2021 1047  ? LDLCALC 45 07/13/2021 1221  ? LDLDIRECT 124.3 11/10/2010 0953  ? LABVLDL 37 07/13/2021 1221  ? ?Diabetes Interventions:  (Status:  Goal on track:  Yes.) Long Term Goal ?Received  inbound call from patient  ?Determined patient is out of his 14 mg dose of Rybelsus, he did not receive his shipment from patient assistance ?Reviewed patient's medication profile to determine patient ha

## 2021-09-02 NOTE — Chronic Care Management (AMB) (Signed)
09/01/2021- Patient Robert Walters medication will be filled on 09/13/2021 and should arrive to the office in 10-14 business days.  Pattricia Boss, [place] Pharmacist Assistant [phone]

## 2021-09-03 ENCOUNTER — Telehealth: Payer: Self-pay

## 2021-09-03 NOTE — Chronic Care Management (AMB) (Addendum)
? ? ?  Chronic Care Management ?Pharmacy Assistant  ? ?Name: Robert Walters  MRN: [id number] DOB: 05/30/1955 ? ? ?Reason for Encounter: Patient assistance  ? ?09-03-2021: Patient stated he hasn't received Rybelsus. Last contact with novo (Please see note on 07-07-2021) I was told rybelsus was returned due to office being closed. Representative told me that a replacement order was never processed in march as told so but was today made. Was able to get another free voucher. ? ?BIN [id number] ?PCN [id number] ?GP [id number] ?ID [id number] ? ?[facility] with voucher information. Patient had a refill left on medication and voucher was processed. Patient was informed. ? ?Medications: ?Outpatient Encounter Medications as of 09/03/2021  ?Medication Sig  ? alfuzosin (UROXATRAL) 10 MG 24 hr tablet Take 1 tablet (10 mg total) by mouth daily. (Patient not taking: Reported on 08/18/2021)  ? Ascorbic Acid (VITAMIN C) 500 MG CAPS Take 500 mg by mouth daily.   ? aspirin EC 81 MG tablet Take 1 tablet (81 mg total) by mouth daily.  ? atorvastatin (LIPITOR) 80 MG tablet Take 1 tablet (80 mg total) by mouth daily.  ? b complex vitamins tablet Take 1 tablet by mouth daily.   ? cholecalciferol (VITAMIN D3) 25 MCG (1000 UNIT) tablet Take 2,000 Units by mouth daily.  ? CINNAMON PO Take 2,000 mg by mouth daily.  ? Coenzyme Q10 (COQ10) 200 MG CAPS Take 200 mg by mouth daily.  ? empagliflozin (JARDIANCE) 25 MG TABS tablet Take 1 tablet (25 mg total) by mouth daily before breakfast.  ? ezetimibe (ZETIA) 10 MG tablet Take 1 tablet (10 mg total) by mouth daily.  ? FLUoxetine (PROZAC) 40 MG capsule TAKE 1 CAPSULE( '40MG'$  TOTAL) BY MOUTH EVERY MORNING  ? Garlic 834 MG CAPS Take 500 mg by mouth daily.  ? insulin degludec (TRESIBA FLEXTOUCH) 100 UNIT/ML FlexTouch Pen Inject 55 Units into the skin daily.  ? Insulin Pen Needle (PEN NEEDLES) 32G X 4 MM MISC Use as directed  ? lamoTRIgine (LAMICTAL) 150 MG tablet Take 1 tablet (150 mg total) by mouth 2  (two) times daily.  ? LORazepam (ATIVAN) 1 MG tablet TAKE 1 TABLET BY MOUTH THREE TIMES DAILY AS NEEDED FOR ANXIETY. GENERIC EQUIVALENT FOR ATIVAN.  ? Menthol-Methyl Salicylate (ARTHRITIS HOT) 10-15 % CREA Apply 1 application topically daily as needed (pain). (Patient not taking: Reported on 08/18/2021)  ? Menthol-Methyl Salicylate (ICY HOT) 19-62 % STCK Apply 1 application topically 3 (three) times daily as needed (pain).  ? metFORMIN (GLUCOPHAGE) 1000 MG tablet TAKE 1 TABLET BY MOUTH 2 TIMES DAILY WITH MEALS  ? metoprolol tartrate (LOPRESSOR) 25 MG tablet TAKE 1 TABLET BY MOUTH 2 TIMES DAILY GENERIC EQUIVALENT FOR LOPRESSOR.  ? Multiple Vitamin (MULTIVITAMIN WITH MINERALS) TABS tablet Take 1 tablet by mouth daily.  ? Omega-3 Fatty Acids (FISH OIL) 1000 MG CAPS Take 2,000 mg by mouth daily.  ? omeprazole (PRILOSEC) 40 MG capsule Take 1 capsule (40 mg total) by mouth daily.  ? ONETOUCH ULTRA test strip USE 1 STRIP TO CHECK GLUCOSE THREE TIMES DAILY  ? Semaglutide (RYBELSUS) 14 MG TABS Take 1 tablet (14 mg total) by mouth daily at 6 (six) AM.  ? valsartan (DIOVAN) 320 MG tablet Take 1 tablet (320 mg total) by mouth daily.  ? ?No facility-administered encounter medications on file as of 09/03/2021.  ? ? ? ?Jeannette How CMA ?Clinical Pharmacist Assistant ?[phone] ? ?

## 2021-09-07 DIAGNOSIS — I739 Peripheral vascular disease, unspecified: Secondary | ICD-10-CM | POA: Diagnosis not present

## 2021-09-09 ENCOUNTER — Telehealth: Payer: Self-pay

## 2021-09-09 NOTE — Chronic Care Management (AMB) (Signed)
Fax received from [employer] patient assistance program, NovoFine Needles will be fulfilled on 09/20/2021 and will arrive to the office in 10-14 business days.  Pattricia Boss, [place] Pharmacist Assistant [phone]

## 2021-09-11 ENCOUNTER — Telehealth: Payer: Self-pay

## 2021-09-11 NOTE — Telephone Encounter (Signed)
Pt PAP Rybelsus recvd. Pt notified. ?

## 2021-09-30 ENCOUNTER — Telehealth: Payer: Self-pay

## 2021-09-30 DIAGNOSIS — Z794 Long term (current) use of insulin: Secondary | ICD-10-CM | POA: Diagnosis not present

## 2021-09-30 DIAGNOSIS — E1122 Type 2 diabetes mellitus with diabetic chronic kidney disease: Secondary | ICD-10-CM

## 2021-09-30 DIAGNOSIS — N1831 Chronic kidney disease, stage 3a: Secondary | ICD-10-CM | POA: Diagnosis not present

## 2021-09-30 DIAGNOSIS — E785 Hyperlipidemia, unspecified: Secondary | ICD-10-CM | POA: Diagnosis not present

## 2021-09-30 NOTE — Chronic Care Management (AMB) (Cosign Needed)
Novo Nordisk patient assistance program notification:  120- day supply of Rybelsus 14 mg (30ct)  should arrive to the office in 10-14 business days. Patient has 3  refill remaining and enrollment will expire on 04/01/2022.  The next refill for patient will be fulfilled on 09/14/2021  Pattricia Boss, [place] Pharmacist Assistant [phone]

## 2021-10-13 ENCOUNTER — Telehealth: Payer: Self-pay

## 2021-10-13 NOTE — Chronic Care Management (AMB) (Signed)
Novo Nordisk patient assistance program notification:  120- day supply of Rybelsus 14 mg will be filled on 10/28/2021 and should arrive to the office in 10-14 business days. Patient enrollment will expire on 04/01/2022.  Pattricia Boss, [place] Pharmacist Assistant [phone]

## 2021-10-15 ENCOUNTER — Telehealth: Payer: Self-pay

## 2021-10-15 NOTE — Chronic Care Management (AMB) (Signed)
Chronic Care Management Pharmacy Assistant   Name: Robert Walters  MRN: [id number] DOB: 1955-07-14   Reason for Encounter: Disease State/ Diabetes  Recent office visits:  09-01-2021 Lynne Logan, RN (CCM)  08-20-2021 Rex Kras, Claudette Stapler, RN (CCM)  Recent consult visits:  08-18-2021 Nahser, Wonda Cheng, MD (Cardiology). Follow up visit no changes.  Hospital visits:  None in previous 6 months  Medications: Outpatient Encounter Medications as of 10/15/2021  Medication Sig   alfuzosin (UROXATRAL) 10 MG 24 hr tablet Take 1 tablet (10 mg total) by mouth daily. (Patient not taking: Reported on 08/18/2021)   Ascorbic Acid (VITAMIN C) 500 MG CAPS Take 500 mg by mouth daily.    aspirin EC 81 MG tablet Take 1 tablet (81 mg total) by mouth daily.   atorvastatin (LIPITOR) 80 MG tablet Take 1 tablet (80 mg total) by mouth daily.   b complex vitamins tablet Take 1 tablet by mouth daily.    cholecalciferol (VITAMIN D3) 25 MCG (1000 UNIT) tablet Take 2,000 Units by mouth daily.   CINNAMON PO Take 2,000 mg by mouth daily.   Coenzyme Q10 (COQ10) 200 MG CAPS Take 200 mg by mouth daily.   empagliflozin (JARDIANCE) 25 MG TABS tablet Take 1 tablet (25 mg total) by mouth daily before breakfast.   ezetimibe (ZETIA) 10 MG tablet Take 1 tablet (10 mg total) by mouth daily.   FLUoxetine (PROZAC) 40 MG capsule TAKE 1 CAPSULE( '40MG'$  TOTAL) BY MOUTH EVERY MORNING   Garlic 485 MG CAPS Take 500 mg by mouth daily.   insulin degludec (TRESIBA FLEXTOUCH) 100 UNIT/ML FlexTouch Pen Inject 55 Units into the skin daily.   Insulin Pen Needle (PEN NEEDLES) 32G X 4 MM MISC Use as directed   lamoTRIgine (LAMICTAL) 150 MG tablet Take 1 tablet (150 mg total) by mouth 2 (two) times daily.   LORazepam (ATIVAN) 1 MG tablet TAKE 1 TABLET BY MOUTH THREE TIMES DAILY AS NEEDED FOR ANXIETY. GENERIC EQUIVALENT FOR ATIVAN.   Menthol-Methyl Salicylate (ARTHRITIS HOT) 10-15 % CREA Apply 1 application topically daily as needed (pain).  (Patient not taking: Reported on 08/18/2021)   Menthol-Methyl Salicylate (ICY HOT) 46-27 % STCK Apply 1 application topically 3 (three) times daily as needed (pain).   metFORMIN (GLUCOPHAGE) 1000 MG tablet TAKE 1 TABLET BY MOUTH 2 TIMES DAILY WITH MEALS   metoprolol tartrate (LOPRESSOR) 25 MG tablet TAKE 1 TABLET BY MOUTH 2 TIMES DAILY GENERIC EQUIVALENT FOR LOPRESSOR.   Multiple Vitamin (MULTIVITAMIN WITH MINERALS) TABS tablet Take 1 tablet by mouth daily.   Omega-3 Fatty Acids (FISH OIL) 1000 MG CAPS Take 2,000 mg by mouth daily.   omeprazole (PRILOSEC) 40 MG capsule Take 1 capsule (40 mg total) by mouth daily.   ONETOUCH ULTRA test strip USE 1 STRIP TO CHECK GLUCOSE THREE TIMES DAILY   Semaglutide (RYBELSUS) 14 MG TABS Take 1 tablet (14 mg total) by mouth daily at 6 (six) AM.   valsartan (DIOVAN) 320 MG tablet Take 1 tablet (320 mg total) by mouth daily.   No facility-administered encounter medications on file as of 10/15/2021.  Recent Relevant Labs: Lab Results  Component Value Date/Time   HGBA1C 7.2 (H) 07/13/2021 12:21 PM   HGBA1C 6.7 (H) 04/14/2021 10:49 AM   MICROALBUR 10 04/14/2021 11:45 AM   MICROALBUR 10 03/26/2020 01:16 PM    Kidney Function Lab Results  Component Value Date/Time   CREATININE 1.40 (H) 07/13/2021 12:21 PM   CREATININE 1.42 (H) 04/14/2021 10:49 AM  CREATININE 1.33 (H) 10/19/2016 02:51 PM   GFRNONAA 50 (L) 03/26/2020 11:09 AM   GFRAA 58 (L) 03/26/2020 11:09 AM    Current antihyperglycemic regimen:  Rybelsus 14 mg tablet once per day  Tresiba FlexTouch - inject 55 units into the skin daily  Jardiance 25 mg tablet daily  Metformin 1000 mg twice daily  What recent interventions/DTPs have been made to improve glycemic control:  Educated on A1c and blood sugar goals; Complications of diabetes including kidney damage, retinal damage, and cardiovascular disease; -Patient reports that his bottle said he had 2 refills left for Jardiance but he was not able to  fill it.  -Counseled to check feet daily and get yearly eye exams - We discussed in detail the processes and steps that he would be taking to get his medication.   Have there been any recent hospitalizations or ED visits since last visit with CPP? No  Patient denies hypoglycemic symptoms  Patient denies hyperglycemic symptoms  How often are you checking your blood sugar? once daily  What are your blood sugars ranging?  Fasting: 180 06-15, 156 06-14, 145 06-13, 135 06-12 Before meals: None After meals: None Bedtime: None  During the week, how often does your blood glucose drop below 70? Never  Are you checking your feet daily/regularly? Daily  Adherence Review: Is the patient currently on a STATIN medication? Yes Is the patient currently on ACE/ARB medication? Yes Does the patient have >5 day gap between last estimated fill dates? No  NOTES: Patient stated he needs more one touch test strips sent to [facility]. Patient requested refills to be added. Sent message to [place].   Care Gaps: Yearly ophthalmology overdue AWV 04-22-2022  Star Rating Drugs: Atorvastatin 40 mg- Last filled 10-05-2021 [facility] Valsartan 320 mg- Last filled 08-18-2021 [facility] Semaglutide 14 mg- Patient assistance Metformin 1000 mg- Last filled 08-13-2021 [facility] Jardiance 25 mg- Patient assistance    [place] Pharmacist Assistant [phone]

## 2021-10-16 ENCOUNTER — Other Ambulatory Visit: Payer: Self-pay

## 2021-10-16 ENCOUNTER — Telehealth: Payer: Self-pay

## 2021-10-16 DIAGNOSIS — E1165 Type 2 diabetes mellitus with hyperglycemia: Secondary | ICD-10-CM

## 2021-10-16 MED ORDER — ONETOUCH ULTRA VI STRP: ORAL_STRIP | 2 refills | Status: DC

## 2021-10-16 NOTE — Chronic Care Management (AMB) (Cosign Needed)
Novo Nordisk patient assistance program notification:  120- day supply of NovoFine Needles  should arrive to the office in 10-14 business days. Patient has 1  refill remaining and enrollment will expire on 04/01/2022.  The next refill for patient will be fulfilled on 12/09/2021.  Pattricia Boss, [place] Pharmacist Assistant [phone]

## 2021-10-20 ENCOUNTER — Other Ambulatory Visit: Payer: Self-pay

## 2021-10-20 DIAGNOSIS — H524 Presbyopia: Secondary | ICD-10-CM | POA: Diagnosis not present

## 2021-10-20 DIAGNOSIS — Z794 Long term (current) use of insulin: Secondary | ICD-10-CM | POA: Diagnosis not present

## 2021-10-20 DIAGNOSIS — Z7984 Long term (current) use of oral hypoglycemic drugs: Secondary | ICD-10-CM | POA: Diagnosis not present

## 2021-10-20 DIAGNOSIS — E119 Type 2 diabetes mellitus without complications: Secondary | ICD-10-CM | POA: Diagnosis not present

## 2021-10-20 DIAGNOSIS — H2513 Age-related nuclear cataract, bilateral: Secondary | ICD-10-CM | POA: Diagnosis not present

## 2021-10-20 DIAGNOSIS — E1165 Type 2 diabetes mellitus with hyperglycemia: Secondary | ICD-10-CM

## 2021-10-20 LAB — HM DIABETES EYE EXAM

## 2021-10-20 MED ORDER — ONETOUCH ULTRA VI STRP: ORAL_STRIP | 2 refills | Status: DC

## 2021-10-29 ENCOUNTER — Ambulatory Visit: Payer: Medicare Other | Admitting: Internal Medicine

## 2021-11-02 ENCOUNTER — Other Ambulatory Visit (INDEPENDENT_AMBULATORY_CARE_PROVIDER_SITE_OTHER): Payer: Self-pay | Admitting: *Deleted

## 2021-11-02 DIAGNOSIS — K219 Gastro-esophageal reflux disease without esophagitis: Secondary | ICD-10-CM

## 2021-11-11 ENCOUNTER — Telehealth: Payer: Self-pay

## 2021-11-11 NOTE — Chronic Care Management (AMB) (Signed)
Novo Nordisk patient assistance program notification:  120- day supply of Rybelsus 14 mg (30ct) was filled on 10/30/2021 and should arrive to the office in 10-14 business days. Patient has 3  refill remaining and enrollment will expire on 04/01/2022.  The next refill for patient will be fulfilled on 12/12/2021.  Pattricia Boss, [place] Pharmacist Assistant [phone]

## 2021-11-15 IMAGING — US US ABDOMEN LIMITED W/ ELASTOGRAPHY
1 series · 12 of 25 positions shown · non-contrast
Comparison: 12/28/2018

CLINICAL DATA: NASH. Patient is status post cholecystectomy
08/25/2016. There is a

EXAM:
US ABDOMEN LIMITED - RIGHT UPPER QUADRANT
ULTRASOUND HEPATIC ELASTOGRAPHY
TECHNIQUE: Sonography of the right upper quadrant was performed. In addition,
ultrasound elastography evaluation of the liver was performed. A
region of interest was placed within the right lobe of the liver.
Following application of a compressive sonographic pulse, tissue
compressibility was assessed. Multiple assessments were performed at
the selected site. Median tissue compressibility was determined.
Previously, hepatic stiffness was assessed by shear wave velocity.
Based on recently published Society of Radiologists in Ultrasound
consensus article, reporting is now recommended to be performed in
the SI units of pressure (kiloPascals) representing hepatic
stiffness/elasticity. The obtained result is compared to the
published reference standards. (cACLD = compensated Advanced Chronic
Liver Disease)

[Series 1: us abdomen ruq w/elastography · 12 of 48 slices shown]
[im 2/48]
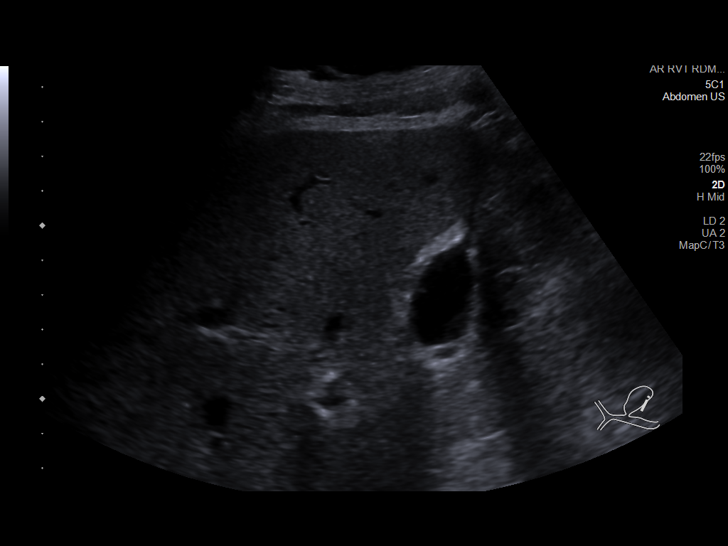
[im 6/48]
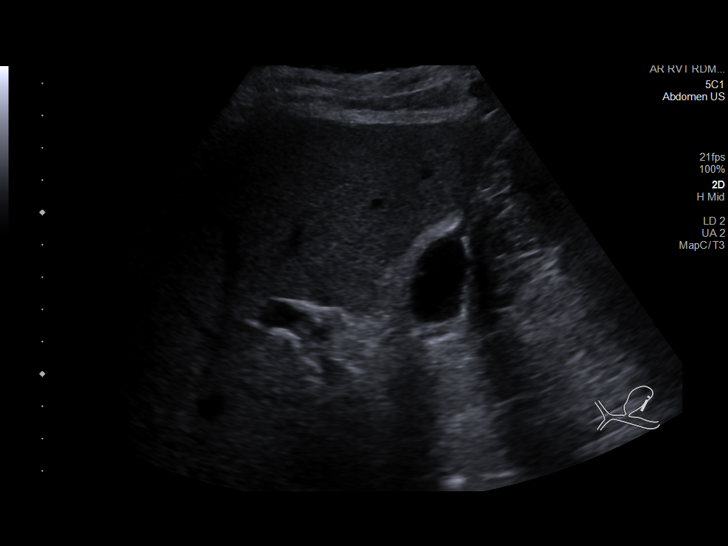
[im 10/48]
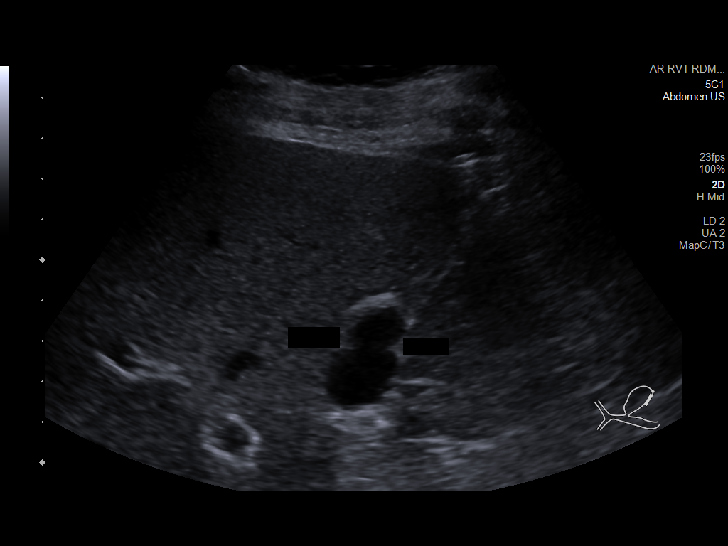
[im 14/48]
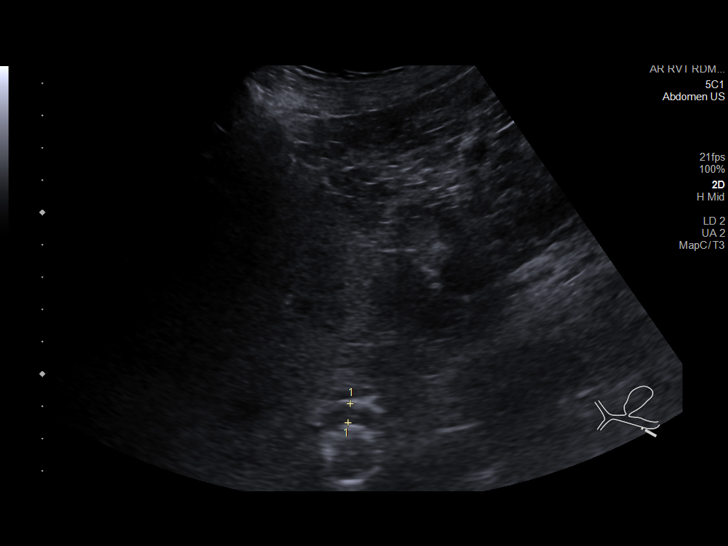
[im 18/48]
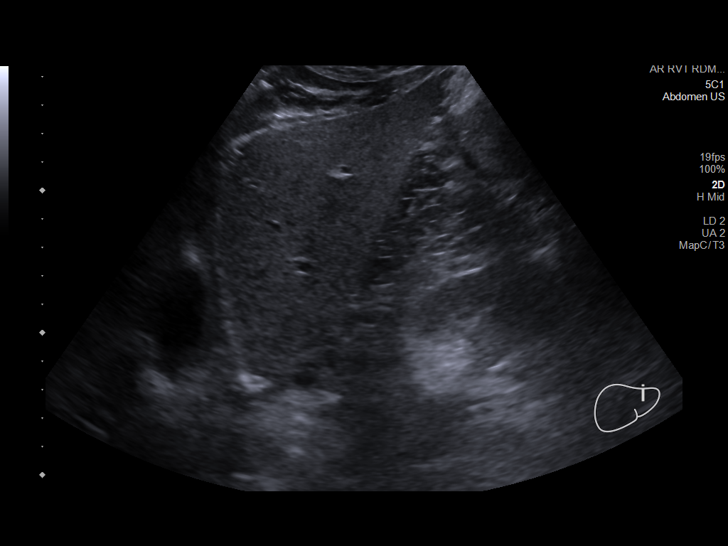
[im 22/48]
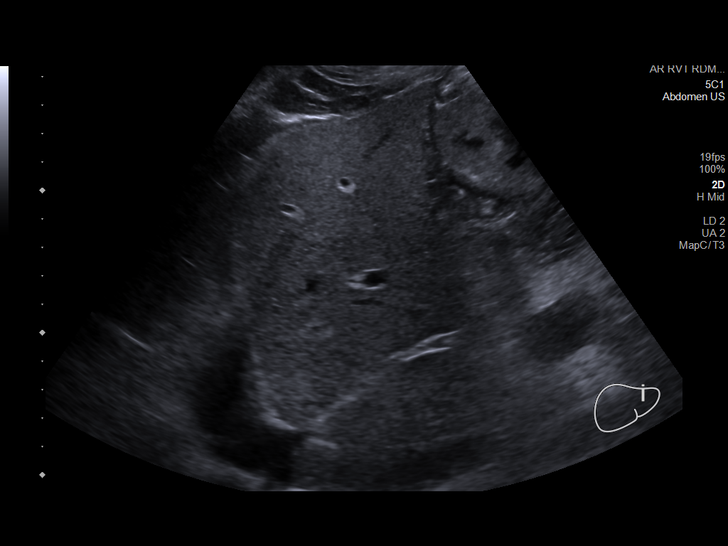
[im 26/48]
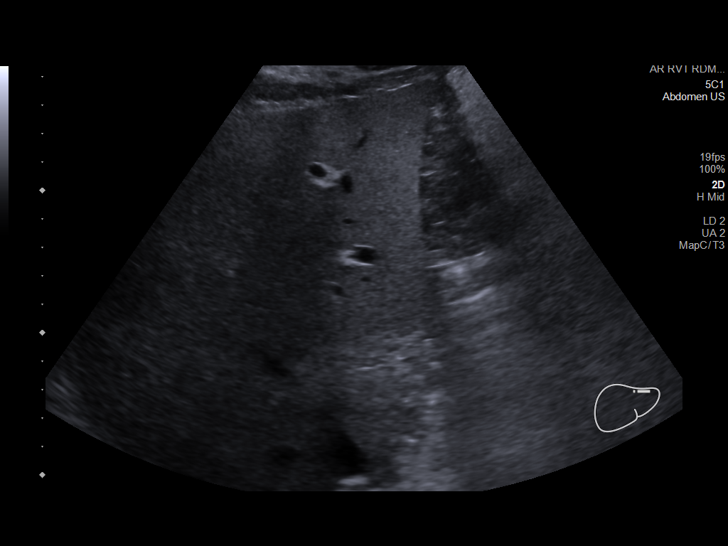
[im 30/48]
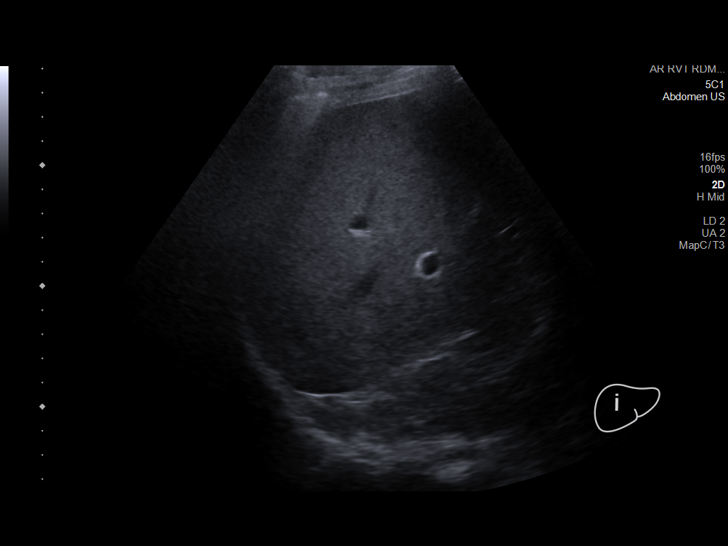
[im 34/48]
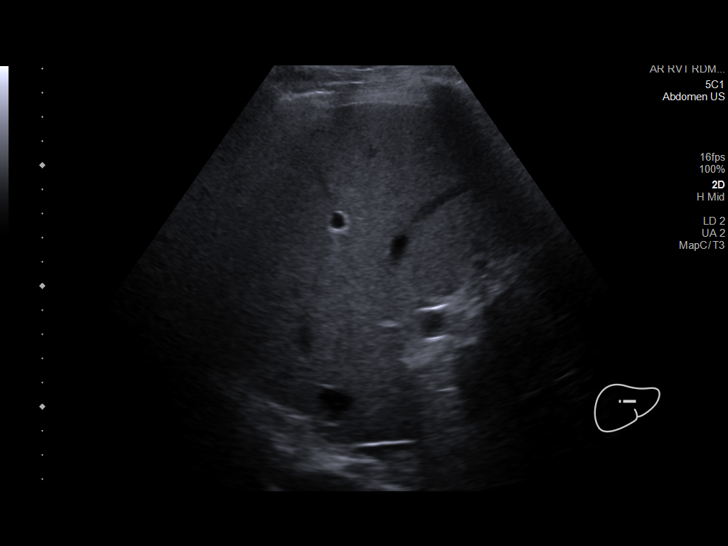
[im 38/48]
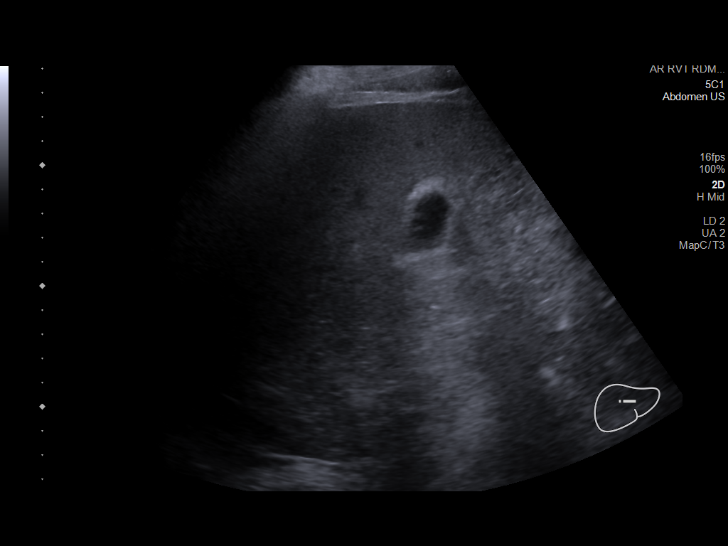
[im 42/48]
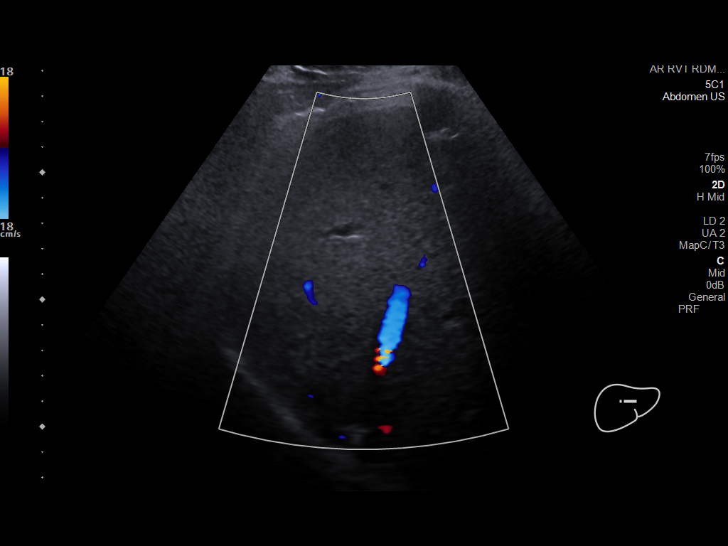
[im 46/48]
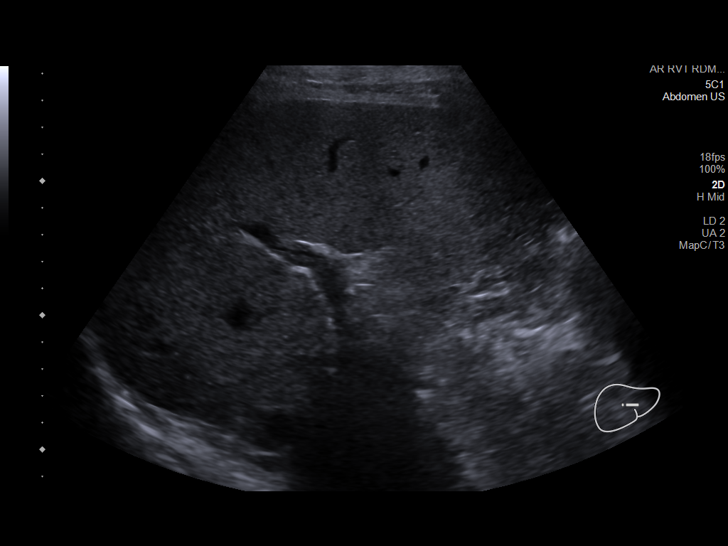

[12 of 25 positions shown; findings below may reference images not displayed]

FINDINGS: ULTRASOUND ABDOMEN LIMITED RIGHT UPPER QUADRANT

Gallbladder:

Surgically absent. Apparent focal fluid collection noted in the
gallbladder fossa.

Common bile duct:

Diameter: 5 mm

Liver:

Liver parenchyma is echogenic without a discrete focal abnormality
evident. Portal vein is patent on color Doppler imaging with normal
direction of blood flow towards the liver.

ULTRASOUND HEPATIC ELASTOGRAPHY

Device: Siemens Helix VTQ

Patient position: Supine

Transducer 5C1

Number of measurements: 10

Hepatic segment:  8

Median kPa:

IQR:

IQR/Median kPa ratio:

Data quality:  Good

Diagnostic category:  < or = 5 kPa: high probability of being normal

The use of hepatic elastography is applicable to patients with viral
hepatitis and non-alcoholic fatty liver disease. At this time, there
is insufficient data for the referenced cut-off values and use in
other causes of liver disease, including alcoholic liver disease.
Patients, however, may be assessed by elastography and serve as
their own reference standard/baseline.

In patients with non-alcoholic liver disease, the values suggesting
compensated advanced chronic liver disease (cACLD) may be lower, and
patients may need additional testing with elasticity results of [DATE]
kPa.

Please note that abnormal hepatic elasticity and shear wave
velocities may also be identified in clinical settings other than
with hepatic fibrosis, such as: acute hepatitis, elevated right
heart and central venous pressures including use of beta blockers,
Hajak disease (Ferienhaus), infiltrative processes such as
mastocytosis/amyloidosis/infiltrative tumor/lymphoma, extrahepatic
cholestasis, with hyperemia in the post-prandial state, and with
liver transplantation. Correlation with patient history, laboratory
data, and clinical condition recommended.

Diagnostic Categories:

< or =5 kPa: high probability of being normal

< or =9 kPa: in the absence of other known clinical signs, rules [DATE] kPa and ?13 kPa: suggestive of cACLD, but needs further testing

>13 kPa: highly suggestive of cACLD

> or =17 kPa: highly suggestive of cACLD with an increased
probability of clinically significant portal hypertension
IMPRESSION: ULTRASOUND RUQ:

Patient is status post cholecystectomy. Small fluid collection
identified in the gallbladder fossa today. This finding is
indeterminate given surgery was 4 years ago. Abdomen CT with
contrast could be used to further evaluate as clinically warranted.

Echogenic liver suggests fatty deposition.

ULTRASOUND HEPATIC ELASTOGRAPHY:

Median kPa:

Diagnostic category:  < or = 5 kPa: high probability of being normal

## 2021-11-16 ENCOUNTER — Encounter: Payer: Self-pay | Admitting: Nurse Practitioner

## 2021-11-16 ENCOUNTER — Other Ambulatory Visit: Payer: Self-pay | Admitting: Physician Assistant

## 2021-11-16 ENCOUNTER — Ambulatory Visit (INDEPENDENT_AMBULATORY_CARE_PROVIDER_SITE_OTHER): Payer: Medicare Other | Admitting: Nurse Practitioner

## 2021-11-16 VITALS — BP 122/70 | HR 91 | Temp 97.7°F | Ht 69.0 in | Wt 229.6 lb

## 2021-11-16 DIAGNOSIS — E6609 Other obesity due to excess calories: Secondary | ICD-10-CM

## 2021-11-16 DIAGNOSIS — E782 Mixed hyperlipidemia: Secondary | ICD-10-CM

## 2021-11-16 DIAGNOSIS — F419 Anxiety disorder, unspecified: Secondary | ICD-10-CM

## 2021-11-16 DIAGNOSIS — N1831 Chronic kidney disease, stage 3a: Secondary | ICD-10-CM

## 2021-11-16 DIAGNOSIS — E1122 Type 2 diabetes mellitus with diabetic chronic kidney disease: Secondary | ICD-10-CM

## 2021-11-16 DIAGNOSIS — I119 Hypertensive heart disease without heart failure: Secondary | ICD-10-CM

## 2021-11-16 DIAGNOSIS — Z794 Long term (current) use of insulin: Secondary | ICD-10-CM | POA: Diagnosis not present

## 2021-11-16 DIAGNOSIS — I10 Essential (primary) hypertension: Secondary | ICD-10-CM | POA: Diagnosis not present

## 2021-11-16 DIAGNOSIS — F3341 Major depressive disorder, recurrent, in partial remission: Secondary | ICD-10-CM

## 2021-11-16 DIAGNOSIS — Z6833 Body mass index (BMI) 33.0-33.9, adult: Secondary | ICD-10-CM

## 2021-11-16 DIAGNOSIS — M436 Torticollis: Secondary | ICD-10-CM

## 2021-11-16 DIAGNOSIS — M791 Myalgia, unspecified site: Secondary | ICD-10-CM

## 2021-11-16 NOTE — Patient Instructions (Signed)

## 2021-11-16 NOTE — Telephone Encounter (Signed)
Filled 06/09/21 appt 8/8

## 2021-11-16 NOTE — Progress Notes (Signed)
Robert Walters,acting as a Education administrator for Robert Brine, FNP.,have documented all relevant documentation on the behalf of Robert Brine, FNP,as directed by  Robert Brine, FNP while in the presence of Robert Walters, [place].    Subjective:     Patient ID: Robert Walters , male    DOB: 12-09-1955 , 66 y.o.   MRN: [id number]   Chief Complaint  Patient presents with   Diabetes   Hypertension    HPI  Patient presents today for dm and bp check. No other issues. Continues with behavioral health, no changes.   BP Readings from Last 3 Encounters: 11/16/21 : 122/70 08/18/21 : 130/78 07/23/21 : 130/84   Wt Readings from Last 3 Encounters: 11/16/21 : 229 lb 9.6 oz (104.1 kg) 08/18/21 : 234 lb 12.8 oz (106.5 kg) 07/23/21 : 236 lb 9.6 oz (107.3 kg)    Diabetes He presents for his follow-up diabetic visit. He has type 2 diabetes mellitus. His disease course has been stable. There are no hypoglycemic associated symptoms. Pertinent negatives for hypoglycemia include no dizziness or headaches. There are no diabetic associated symptoms. Pertinent negatives for diabetes include no chest pain, no fatigue, no polydipsia, no polyphagia and no polyuria. There are no hypoglycemic complications. Symptoms are stable. Diabetic complications include heart disease. Risk factors for coronary artery disease include diabetes mellitus, sedentary lifestyle, male sex and obesity. Current diabetic treatment includes oral agent (monotherapy). He is compliant with treatment all of the time. He is following a generally unhealthy diet. He has not had a previous visit with a dietitian. He rarely participates in exercise. (Blood sugars in 130-160, occasionally will be 180) An ACE inhibitor/angiotensin II receptor blocker is being taken. He does not see a podiatrist.Eye exam is current.  Hypertension This is a chronic problem. The current episode started more than 1 year ago. The problem is unchanged. The problem is controlled.  Pertinent negatives include no anxiety, chest pain, headaches or palpitations. There are no associated agents to hypertension. Risk factors for coronary artery disease include obesity and sedentary lifestyle. There are no compliance problems.  There is no history of angina. There is no history of chronic renal disease.     Past Medical History:  Diagnosis Date   Acid reflux disease    Bipolar 1 disorder, mixed (HCC)    BPH (benign prostatic hypertrophy)    CVA (cerebral vascular accident) ([place]) 07/2015   Gastritis    H/O hiatal hernia    Hyperlipidemia    Hypertension    Normal nuclear stress test Dec 2011   Obesity    TIA (transient ischemic attack) 05/15/2012   "they think I've had one this am @ 0130" (05/15/2012)   Type II diabetes mellitus ([place])    Vertigo      Family History  Problem Relation Age of Onset   Hyperlipidemia Father        in a nursing home   Colon cancer Father    Stroke Mother 41     Current Outpatient Medications:    Ascorbic Acid (VITAMIN C) 500 MG CAPS, Take 500 mg by mouth daily. , Disp: , Rfl:    aspirin EC 81 MG tablet, Take 1 tablet (81 mg total) by mouth daily., Disp: , Rfl:    atorvastatin (LIPITOR) 80 MG tablet, Take 1 tablet (80 mg total) by mouth daily., Disp: 90 tablet, Rfl: 1   b complex vitamins tablet, Take 1 tablet by mouth daily. , Disp: , Rfl:    cholecalciferol (  VITAMIN D3) 25 MCG (1000 UNIT) tablet, Take 2,000 Units by mouth daily., Disp: , Rfl:    CINNAMON PO, Take 2,000 mg by mouth daily., Disp: , Rfl:    Coenzyme Q10 (COQ10) 200 MG CAPS, Take 200 mg by mouth daily., Disp: , Rfl:    empagliflozin (JARDIANCE) 25 MG TABS tablet, Take 1 tablet (25 mg total) by mouth daily before breakfast., Disp: 30 tablet, Rfl: 2   ezetimibe (ZETIA) 10 MG tablet, Take 1 tablet (10 mg total) by mouth daily., Disp: 90 tablet, Rfl: 3   Garlic 338 MG CAPS, Take 500 mg by mouth daily., Disp: , Rfl:    glucose blood (ONETOUCH ULTRA) test strip, USE 1 STRIP TO  CHECK GLUCOSE THREE TIMES DAILY, Disp: 100 each, Rfl: 2   insulin degludec (TRESIBA FLEXTOUCH) 100 UNIT/ML FlexTouch Pen, Inject 55 Units into the skin daily., Disp: 15 mL, Rfl: 1   Insulin Pen Needle (PEN NEEDLES) 32G X 4 MM MISC, Use as directed, Disp: 100 each, Rfl: 2   lamoTRIgine (LAMICTAL) 150 MG tablet, Take 1 tablet (150 mg total) by mouth 2 (two) times daily., Disp: 180 tablet, Rfl: 1   Menthol-Methyl Salicylate (ARTHRITIS HOT) 10-15 % CREA, Apply 1 application  topically daily as needed (pain)., Disp: , Rfl:    Menthol-Methyl Salicylate (ICY HOT) 32-91 % STCK, Apply 1 application topically 3 (three) times daily as needed (pain)., Disp: , Rfl:    metFORMIN (GLUCOPHAGE) 1000 MG tablet, TAKE 1 TABLET BY MOUTH 2 TIMES DAILY WITH MEALS, Disp: 180 tablet, Rfl: 1   metoprolol tartrate (LOPRESSOR) 25 MG tablet, TAKE 1 TABLET BY MOUTH 2 TIMES DAILY GENERIC EQUIVALENT FOR LOPRESSOR., Disp: 180 tablet, Rfl: 3   Multiple Vitamin (MULTIVITAMIN WITH MINERALS) TABS tablet, Take 1 tablet by mouth daily., Disp: , Rfl:    Omega-3 Fatty Acids (FISH OIL) 1000 MG CAPS, Take 2,000 mg by mouth daily., Disp: , Rfl:    Semaglutide (RYBELSUS) 14 MG TABS, Take 1 tablet (14 mg total) by mouth daily at 6 (six) AM., Disp: 90 tablet, Rfl: 0   valsartan (DIOVAN) 320 MG tablet, Take 1 tablet (320 mg total) by mouth daily., Disp: 90 tablet, Rfl: 3   alfuzosin (UROXATRAL) 10 MG 24 hr tablet, Take 1 tablet (10 mg total) by mouth daily., Disp: 30 tablet, Rfl: 2   FLUoxetine (PROZAC) 40 MG capsule, TAKE ONE CAPSULE BY MOUTH EVERY MORNING, Disp: 90 capsule, Rfl: 0   LORazepam (ATIVAN) 1 MG tablet, TAKE 1 TABLET BY MOUTH THREE TIMES DAILY AS NEEDED FOR ANXIETY., Disp: 270 tablet, Rfl: 0   omeprazole (PRILOSEC) 40 MG capsule, Take 1 capsule (40 mg total) by mouth daily., Disp: 90 capsule, Rfl: 0   No Known Allergies   Review of Systems  Constitutional: Negative.  Negative for fatigue.  HENT: Negative.    Eyes: Negative.    Cardiovascular: Negative.  Negative for chest pain, palpitations and leg swelling.  Endocrine: Negative.  Negative for polydipsia, polyphagia and polyuria.  Genitourinary: Negative.   Musculoskeletal: Negative.   Skin: Negative.   Allergic/Immunologic: Negative.   Neurological: Negative.  Negative for dizziness and headaches.  Hematological: Negative.   Psychiatric/Behavioral: Negative.       Today's Vitals   11/16/21 1057  BP: 122/70  Pulse: 91  Temp: 97.7 F (36.5 C)  TempSrc: Oral  Weight: 229 lb 9.6 oz (104.1 kg)  Height: _0  (1.753 m)  PainSc: 0-No pain   Body mass index is 33.91 kg/m.  Wt  Readings from Last 3 Encounters:  11/16/21 229 lb 9.6 oz (104.1 kg)  08/18/21 234 lb 12.8 oz (106.5 kg)  07/23/21 236 lb 9.6 oz (107.3 kg)     Objective:  Physical Exam Vitals reviewed.  Constitutional:      General: He is not in acute distress.    Appearance: Normal appearance. He is obese.  HENT:     Mouth/Throat:     Mouth: Mucous membranes are moist.  Eyes:     Extraocular Movements: Extraocular movements intact.     Conjunctiva/sclera: Conjunctivae normal.     Pupils: Pupils are equal, round, and reactive to light.  Cardiovascular:     Rate and Rhythm: Normal rate and regular rhythm.     Pulses: Normal pulses.     Heart sounds: Normal heart sounds. No murmur heard. Pulmonary:     Effort: Pulmonary effort is normal. No respiratory distress.     Breath sounds: Normal breath sounds. No wheezing.  Musculoskeletal:        General: No swelling or tenderness (posterior neck).     Comments: Decreased ROM laterally  Skin:    General: Skin is warm and dry.     Capillary Refill: Capillary refill takes less than 2 seconds.  Neurological:     General: No focal deficit present.     Mental Status: He is alert and oriented to person, place, and time.     Cranial Nerves: No cranial nerve deficit.     Motor: No weakness.  Psychiatric:        Mood and Affect: Mood normal.         Behavior: Behavior normal.        Thought Content: Thought content normal.        Judgment: Judgment normal.        Assessment And Plan:     1. Type 2 diabetes mellitus with stage 3a chronic kidney disease, with long-term current use of insulin (HCC) Comments: HgbA1c is improving slightly, continue current medications. Continues to get patient assistance for rybelsus and tresiba - Hemoglobin A1c - POCT Urinalysis Dipstick (81002) - Microalbumin / Creatinine Urine Ratio  2. Essential hypertension Comments: Stable, blood pressure is well controlled. Continue current medications - CMP14+EGFR  3. Mixed hyperlipidemia Comments: Cholesterol levels remain elevated, encouraged to eat low fat diet and continue taking medications - Lipid panel  4. Recurrent major depressive disorder, in partial remission ([place]) Comments: Continue f/u with psychiatry  5. Muscle tension pain Comments: Neck tension, will check cervical xray, he has decreased range of motion laterally. Will also refer to PT for exercises - Ambulatory referral to Physical Therapy - DG Cervical Spine Complete; Future  6. Anxiety Comments: He is taking lorazepam sent by psychiatry.    7. Neck stiffness - DG Cervical Spine Complete; Future  8. Class 1 obesity due to excess calories without serious comorbidity with body mass index (BMI) of 33.0 to 33.9 in adult  he is encouraged to strive for BMI less than 30 to decrease cardiac risk. Advised to aim for at least 150 minutes of exercise per week.   Patient was given opportunity to ask questions. Patient verbalized understanding of the plan and was able to repeat key elements of the plan. All questions were answered to their satisfaction.  Robert Brine, FNP   I, Robert Brine, FNP, have reviewed all documentation for this visit. The documentation on 11/16/21 for the exam, diagnosis, procedures, and orders are all accurate and complete.   IF  YOU HAVE BEEN REFERRED TO A  SPECIALIST, IT MAY TAKE 1-2 WEEKS TO SCHEDULE/PROCESS THE REFERRAL. IF YOU HAVE NOT HEARD FROM US/SPECIALIST IN TWO WEEKS, PLEASE GIVE [place] A CALL AT [phone] X [phone].   THE PATIENT IS ENCOURAGED TO PRACTICE SOCIAL DISTANCING DUE TO THE COVID-19 PANDEMIC.

## 2021-11-17 LAB — CMP14+EGFR
ALT: 55 IU/L — ABNORMAL HIGH (ref 0–44)
AST: 37 IU/L (ref 0–40)
Albumin/Globulin Ratio: 1.9 (ref 1.2–2.2)
Albumin: 4.3 g/dL (ref 3.9–4.9)
Alkaline Phosphatase: 155 IU/L — ABNORMAL HIGH (ref 44–121)
BUN/Creatinine Ratio: 16 (ref 10–24)
BUN: 25 mg/dL (ref 8–27)
Bilirubin Total: 0.9 mg/dL (ref 0.0–1.2)
CO2: 19 mmol/L — ABNORMAL LOW (ref 20–29)
Calcium: 9.4 mg/dL (ref 8.6–10.2)
Chloride: 101 mmol/L (ref 96–106)
Creatinine, Ser: 1.57 mg/dL — ABNORMAL HIGH (ref 0.76–1.27)
Globulin, Total: 2.3 g/dL (ref 1.5–4.5)
Glucose: 139 mg/dL — ABNORMAL HIGH (ref 70–99)
Potassium: 5.2 mmol/L (ref 3.5–5.2)
Sodium: 137 mmol/L (ref 134–144)
Total Protein: 6.6 g/dL (ref 6.0–8.5)
eGFR: 48 mL/min/{1.73_m2} — ABNORMAL LOW (ref >59)

## 2021-11-17 LAB — MICROALBUMIN / CREATININE URINE RATIO
Creatinine, Urine: 122 mg/dL
Microalb/Creat Ratio: 8 mg/g creat (ref 0–29)
Microalbumin, Urine: 10.2 ug/mL

## 2021-11-17 LAB — HEMOGLOBIN A1C
Est. average glucose Bld gHb Est-mCnc: 151 mg/dL
Hgb A1c MFr Bld: 6.9 % — ABNORMAL HIGH (ref 4.8–5.6)

## 2021-11-17 LAB — LIPID PANEL
Chol/HDL Ratio: 3.3 ratio (ref 0.0–5.0)
Cholesterol, Total: 103 mg/dL (ref 100–199)
HDL: 31 mg/dL — ABNORMAL LOW (ref >39)
LDL Chol Calc (NIH): 33 mg/dL (ref 0–99)
Triglycerides: 258 mg/dL — ABNORMAL HIGH (ref 0–149)
VLDL Cholesterol Cal: 39 mg/dL (ref 5–40)

## 2021-11-18 ENCOUNTER — Telehealth: Payer: Self-pay

## 2021-11-18 ENCOUNTER — Telehealth: Payer: Medicare Other

## 2021-11-18 NOTE — Telephone Encounter (Signed)
  Care Management   Follow Up Note   11/18/2021 Name: Robert Walters MRN: [id number] DOB: [birth year]   Referred by: [name], FNP Reason for referral : Care Coordination (RN CC follow up call )   An unsuccessful telephone outreach was attempted today. The patient was referred to the case management team for assistance with care management and care coordination.   Follow Up Plan: Telephone follow up appointment with care management team member scheduled for: 11/30/21  Barb Merino, RN, BSN, CCM Care Management Coordinator [facility]  Direct Phone: [phone]

## 2021-11-23 DIAGNOSIS — I739 Peripheral vascular disease, unspecified: Secondary | ICD-10-CM | POA: Diagnosis not present

## 2021-11-25 ENCOUNTER — Ambulatory Visit: Payer: Medicare Other

## 2021-11-25 ENCOUNTER — Telehealth: Payer: Self-pay

## 2021-11-25 ENCOUNTER — Other Ambulatory Visit: Payer: Self-pay | Admitting: Physician Assistant

## 2021-11-25 DIAGNOSIS — E1122 Type 2 diabetes mellitus with diabetic chronic kidney disease: Secondary | ICD-10-CM

## 2021-11-25 DIAGNOSIS — I10 Essential (primary) hypertension: Secondary | ICD-10-CM

## 2021-11-25 DIAGNOSIS — E782 Mixed hyperlipidemia: Secondary | ICD-10-CM

## 2021-11-25 NOTE — Progress Notes (Signed)
Chronic Care Management Pharmacy Note  11/25/2021 Name:  Robert Walters MRN:  [id number] DOB:  Mar 25, 1956  Summary: Patient reports that he wants help with  his laboratory   Recommendations/Changes made from today's visit: Recommend patient continue to take his medication daily  Recommend patient increase his fresh vegetables.   Plan: Patient to have appointment with cardiologist.    Subjective: [name] is an 66 y.o. year old male who is a primary patient of [name], [place].  The CCM team was consulted for assistance with disease management and care coordination needs.    Engaged with patient by telephone for follow up visit in response to provider referral for pharmacy case management and/or care coordination services.   Consent to Services:  The patient was given information about Chronic Care Management services, agreed to services, and gave verbal consent prior to initiation of services.  Please see initial visit note for detailed documentation.   Patient Care Team: [name], FNP as PCP - General (General Practice) Nahser, Wonda Cheng, MD as PCP - Cardiology (Cardiology) Lynne Logan, RN as Case Manager  Recent office visits: 11/16/2021  Recent consult visits: 08/18/2021  Hospital visits: None in previous 6 months   Objective:  Lab Results  Component Value Date   CREATININE 1.57 (H) 11/16/2021   BUN 25 11/16/2021   EGFR 48 (L) 11/16/2021   GFRNONAA 50 (L) 03/26/2020   GFRAA 58 (L) 03/26/2020   NA 137 11/16/2021   K 5.2 11/16/2021   CALCIUM 9.4 11/16/2021   CO2 19 (L) 11/16/2021   GLUCOSE 139 (H) 11/16/2021    Lab Results  Component Value Date/Time   HGBA1C 6.9 (H) 11/16/2021 11:45 AM   HGBA1C 7.2 (H) 07/13/2021 12:21 PM   MICROALBUR 10 04/14/2021 11:45 AM   MICROALBUR 10 03/26/2020 01:16 PM    Last diabetic Eye exam:  Lab Results  Component Value Date/Time   HMDIABEYEEXA No Retinopathy 01/02/2020 12:00 AM    Last diabetic Foot  exam: No results found for: "HMDIABFOOTEX"   Lab Results  Component Value Date   CHOL 103 11/16/2021   HDL 31 (L) 11/16/2021   LDLCALC 33 11/16/2021   LDLDIRECT 124.3 11/10/2010   TRIG 258 (H) 11/16/2021   CHOLHDL 3.3 11/16/2021       Latest Ref Rng & Units 11/16/2021   11:45 AM 07/13/2021   12:21 PM 04/14/2021   10:49 AM  Hepatic Function  Total Protein 6.0 - 8.5 g/dL 6.6  6.7  6.9   Albumin 3.9 - 4.9 g/dL 4.3  4.5  4.7   AST 0 - 40 IU/L 37  37  36   ALT 0 - 44 IU/L 55  55  51   Alk Phosphatase 44 - 121 IU/L 155  172  166   Total Bilirubin 0.0 - 1.2 mg/dL 0.9  0.6  0.7     Lab Results  Component Value Date/Time   TSH 2.230 07/13/2021 12:21 PM   TSH 1.650 06/30/2020 03:50 PM   FREET4 1.69 03/24/2009 01:00 PM       Latest Ref Rng & Units 04/14/2021   10:49 AM 03/26/2020   11:09 AM 11/29/2018   10:46 AM  CBC  WBC 3.4 - 10.8 x10E3/uL 8.7  6.9  7.0   Hemoglobin 13.0 - 17.7 g/dL 14.6  13.5  12.8   Hematocrit 37.5 - 51.0 % 43.4  40.2  37.6   Platelets 150 - 450 x10E3/uL 157  178  161  No results found for: "VD25OH"  Clinical ASCVD: Yes  The ASCVD Risk score (Arnett DK, et al., 2019) failed to calculate for the following reasons:   The patient has a prior MI or stroke diagnosis       11/16/2021   10:55 AM 07/13/2021   11:35 AM 04/14/2021    9:23 AM  Depression screen PHQ 2/9  Decreased Interest 0 0 0  Down, Depressed, Hopeless 0 0 0  PHQ - 2 Score 0 0 0  Altered sleeping  0 0  Tired, decreased energy  0 0  Change in appetite  0 0  Feeling bad or failure about yourself   0 0  Trouble concentrating  0 0  Moving slowly or fidgety/restless  0 0  Suicidal thoughts  0 0  PHQ-9 Score  0 0      Social History   Tobacco Use  Smoking Status Never  Smokeless Tobacco Never   BP Readings from Last 3 Encounters:  11/16/21 122/70  08/18/21 130/78  07/23/21 130/84   Pulse Readings from Last 3 Encounters:  11/16/21 91  08/18/21 85  07/23/21 77   Wt  Readings from Last 3 Encounters:  11/16/21 229 lb 9.6 oz (104.1 kg)  08/18/21 234 lb 12.8 oz (106.5 kg)  07/23/21 236 lb 9.6 oz (107.3 kg)   BMI Readings from Last 3 Encounters:  11/16/21 33.91 kg/m  08/18/21 34.67 kg/m  07/23/21 34.94 kg/m    Assessment/Interventions: Review of patient past medical history, allergies, medications, health status, including review of consultants reports, laboratory and other test data, was performed as part of comprehensive evaluation and provision of chronic care management services.   SDOH:  (Social Determinants of Health) assessments and interventions performed: No  SDOH Screenings   Alcohol Screen: Not on file  Depression (PHQ2-9): Low Risk  (11/16/2021)   Depression (PHQ2-9)    PHQ-2 Score: 0  Financial Resource Strain: Low Risk  (04/02/2021)   Overall Financial Resource Strain (CARDIA)    Difficulty of Paying Living Expenses: Not hard at all  Food Insecurity: No Food Insecurity (04/02/2021)   Hunger Vital Sign    Worried About Running Out of Food in the Last Year: Never true    [place] in the Last Year: Never true  Housing: Low Risk  (07/16/2019)   Housing    Last Housing Risk Score: 0  Physical Activity: Inactive (04/02/2021)   Exercise Vital Sign    Days of Exercise per Week: 0 days    Minutes of Exercise per Session: 0 min  Social Connections: Not on file  Stress: No Stress Concern Present (04/02/2021)   [place]    Feeling of Stress : Not at all  Tobacco Use: Low Risk  (11/16/2021)   Patient History    Smoking Tobacco Use: Never    Smokeless Tobacco Use: Never    Passive Exposure: Not on file  Transportation Needs: No Transportation Needs (04/02/2021)   PRAPARE - Transportation    Lack of Transportation (Medical): No    Lack of Transportation (Non-Medical): No    CCM Care Plan  No Known Allergies  Medications Reviewed Today     Reviewed by [name], FNP (Family Nurse Practitioner) on 11/16/21 at 1123  Med List Status: <None>   Medication Order Taking? Sig Documenting Provider Last Dose Status Informant  alfuzosin (UROXATRAL) 10 MG 24 hr tablet [id number] No Take 1 tablet (10 mg total) by  mouth daily.  Patient not taking: Reported on 08/18/2021   Franchot Gallo, MD Not Taking Active   Ascorbic Acid (VITAMIN C) 500 MG CAPS [id number] Yes Take 500 mg by mouth daily.  [provider] Taking Active Self  aspirin EC 81 MG tablet [id number] Yes Take 1 tablet (81 mg total) by mouth daily. Nahser, Wonda Cheng, MD Taking Active Self  atorvastatin (LIPITOR) 80 MG tablet [id number] Yes Take 1 tablet (80 mg total) by mouth daily. [name], FNP Taking Active   b complex vitamins tablet [id number] Yes Take 1 tablet by mouth daily.  [provider] Taking Active Self  cholecalciferol (VITAMIN D3) 25 MCG (1000 UNIT) tablet [id number] Yes Take 2,000 Units by mouth daily. [provider] Taking Active Self  CINNAMON PO [id number] Yes Take 2,000 mg by mouth daily. [provider] Taking Active Self  Coenzyme Q10 (COQ10) 200 MG CAPS [id number] Yes Take 200 mg by mouth daily. [provider] Taking Active Self  empagliflozin (JARDIANCE) 25 MG TABS tablet [id number] Yes Take 1 tablet (25 mg total) by mouth daily before breakfast. [name], FNP Taking Active   ezetimibe (ZETIA) 10 MG tablet [id number] Yes Take 1 tablet (10 mg total) by mouth daily. Pixie Casino, MD Taking Active   FLUoxetine (PROZAC) 40 MG capsule [id number] Yes TAKE 1 CAPSULE( $RemoveBe'40MG'mWWGhKXCA$  TOTAL) BY MOUTH EVERY MORNING Addison Lank, PA-C Taking Active   Garlic 150 MG CAPS [id number] Yes Take 500 mg by mouth daily. [provider] Taking Active Self  glucose blood (ONETOUCH ULTRA) test strip [id number] Yes USE 1 STRIP TO CHECK GLUCOSE THREE TIMES DAILY [name], FNP Taking Active   insulin degludec (TRESIBA FLEXTOUCH) 100 UNIT/ML  FlexTouch Pen [id number] Yes Inject 55 Units into the skin daily. [name], FNP Taking Active   Insulin Pen Needle (PEN NEEDLES) 32G X 4 MM MISC [id number] Yes Use as directed [name], FNP Taking Active   lamoTRIgine (LAMICTAL) 150 MG tablet [id number] Yes Take 1 tablet (150 mg total) by mouth 2 (two) times daily. Addison Lank, PA-C Taking Active Self  LORazepam (ATIVAN) 1 MG tablet [id number] Yes TAKE 1 TABLET BY MOUTH THREE TIMES DAILY AS NEEDED FOR ANXIETY. GENERIC EQUIVALENT FOR ATIVAN. Donnal Moat T, PA-C Taking Active   Menthol-Methyl Salicylate (ARTHRITIS HOT) 10-15 % CREA [id number] Yes Apply 1 application  topically daily as needed (pain). [provider] Taking Active Self  Menthol-Methyl Salicylate (ICY HOT) 81-10 % STCK [id number] Yes Apply 1 application topically 3 (three) times daily as needed (pain). [provider] Taking Active Self  metFORMIN (GLUCOPHAGE) 1000 MG tablet [id number] Yes TAKE 1 TABLET BY MOUTH 2 TIMES DAILY WITH MEALS [name], FNP Taking Active   metoprolol tartrate (LOPRESSOR) 25 MG tablet [id number] Yes TAKE 1 TABLET BY MOUTH 2 TIMES DAILY GENERIC EQUIVALENT FOR LOPRESSOR. Nahser, Wonda Cheng, MD Taking Active   Multiple Vitamin (MULTIVITAMIN WITH MINERALS) TABS tablet [id number] Yes Take 1 tablet by mouth daily. [provider] Taking Active Self  Omega-3 Fatty Acids (FISH OIL) 1000 MG CAPS [id number] Yes Take 2,000 mg by mouth daily. [provider] Taking Active Self  omeprazole (PRILOSEC) 40 MG capsule [id number] Yes Take 1 capsule (40 mg total) by mouth daily. Montez Morita, Quillian Quince, MD Taking Active Self  Semaglutide [facility]) 14 MG TABS [id number] Yes Take 1 tablet (14 mg total) by mouth daily at 6 (six) AM. [name], FNP Taking Active   valsartan (DIOVAN) 320 MG tablet [id number] Yes Take  1 tablet (320 mg total) by mouth daily. Nahser, Wonda Cheng, MD Taking Active             Patient Active Problem List    Diagnosis Date Noted   NASH (nonalcoholic steatohepatitis) 11/20/2020   Dysphagia 11/20/2020   Chronic diastolic heart failure ([place]) 03/27/2019   Fatty liver 12/26/2018   Elevated LFTs 12/26/2018   History of colonic polyps 12/26/2018   Fall 08/28/2018   Rib pain on left side 08/28/2018   Anxiety 03/31/2018   Bipolar II disorder ([place]) 03/31/2018   Insomnia 03/31/2018   Type 2 diabetes mellitus without complication, without long-term current use of insulin ([place]) 03/24/2018   Bile leak 09/15/2016   Sepsis ([place]) 08/23/2016   Cholecystitis    Stroke ([place]) 07/25/2015   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 07/25/2015   Dizziness 08/09/2014   TIA (transient ischemic attack) 05/15/2012   Blurred vision 05/15/2012   Ataxia 05/15/2012   Diabetes mellitus type 2 with complications ([place]) 32/67/1245   BPH (benign prostatic hyperplasia) 05/15/2012   GERD (gastroesophageal reflux disease) 05/15/2012   Obesity 05/15/2012   Bipolar 1 disorder ([place]) 05/15/2012   Essential hypertension 10/30/2010   Cardiovascular risk factor 10/30/2010   Pure hypercholesterolemia 12/05/2008    Immunization History  Administered Date(s) Administered   DTaP 10/05/2011   Influenza, High Dose Seasonal PF 01/15/2019, 12/31/2020   Influenza-Unspecified 03/03/2012, 03/16/2018, 03/21/2020   Moderna Covid-19 Vaccine Bivalent Booster 21yr & up 05/22/2021   Moderna Sars-Covid-2 Vaccination 12/07/2019, 01/04/2020, 07/18/2020, 12/05/2020   Pneumococcal Conjugate-13 03/21/2019   Pneumococcal Polysaccharide-23 05/16/2012, 03/21/2020   Tdap 10/01/2020   Zoster Recombinat (Shingrix) 03/21/2019, 11/19/2019    Conditions to be addressed/monitored:  Hyperlipidemia and Diabetes  Care Plan : [place] Updates made by PMayford Knife [place] 11/25/2021 12:00 AM     Problem: HTN, DM II, HLD   Priority: High     Long-Range Goal: Disease Management   Start Date: 01/14/2021  Recent Progress: On track  Priority:  High  Note:   Current Barriers:  Unable to independently afford treatment regimen  Pharmacist Clinical Goal(s):  Patient will achieve adherence to monitoring guidelines and medication adherence to achieve therapeutic efficacy through collaboration with PharmD and provider.   Interventions: 1:1 collaboration with MMinette Brine FNP regarding development and update of comprehensive plan of care as evidenced by provider attestation and co-signature Inter-disciplinary care team collaboration (see longitudinal plan of care) Comprehensive medication review performed; medication list updated in electronic medical record  Hypertension (BP goal <130/80) -Controlled -Current treatment: Valsartan 320 mg tablet once per day Appropriate, Effective, Safe, Accessible Metoprolol Tartrate 25 mg tablet twice per day Appropriate, Effective, Safe, Accessible -Current home readings: he is not currently checking his BP at home  -Current dietary habits: he does not add any salt to his food, he only eats salt when it is with in food  -Current exercise habits: he is walking 30 minutes in the park four days per week  -Denies hypotensive/hypertensive symptoms -Educated on BP goals and benefits of medications for prevention of heart attack, stroke and kidney damage; Daily salt intake goal < 2300 mg; -Counseled to monitor BP at home at least once per month, document, and provide log at future appointments -Recommended to continue current medication  Diabetes (A1c goal <7%) -Controlled -Current medications: Jardiance 25 mg tablet once per day Appropriate, Effective, Safe, Accessible Tresiba 55 units daily  Appropriate, Effective, Safe, Accessible Metformin 1000 mg tablet twice per day Appropriate,  Effective, Safe, Accessible Rybelsus 14 mg tablet once per day  Appropriate, Effective, Safe, Accessible -Current home glucose readings fasting glucose: 135, 146, 147, 152, 155 -Denies hypoglycemic/hyperglycemic  symptoms -Current meal patterns: patient reports that he has cut back on eating a lot of the foods he was eating. He is trying to cut back on the amount of [place] that he has. He is not drinking anything with sugar  -Current exercise: he is walking outside early in the morning  -Educated on Complications of diabetes including kidney damage, retinal damage, and cardiovascular disease; Proper insulin injection technique; Prevention and management of hypoglycemic episodes; -Counseled to check feet daily and get yearly eye exams -Counseled on diet and exercise extensively Recommended to continue current medication   Hyperlipidemia: (LDL goal < 70) -Controlled -Current treatment: Atorvastatin 80 mg tablets once per day Appropriate, Effective, Safe, Accessible Ezetimibe 10 mg tablet once per day Appropriate, Effective, Safe, Accessible -Current dietary patterns: We discussed the importance of eating more healthy, he has cut back on [place] and only having one in the morning  -Educated on Cholesterol goals;  Benefits of statin for ASCVD risk reduction; Importance of limiting foods high in cholesterol; -Recommended to continue current medication  Patient Goals/Self-Care Activities Patient will:  - take medications as prescribed as evidenced by patient report and record review  Follow Up Plan: The patient has been provided with contact information for the care management team and has been advised to call with any health related questions or concerns.       Medication Assistance: None required.  Patient affirms current coverage meets needs.  Compliance/Adherence/Medication fill history: Care Gaps: Ophthalmology Exam - was completed two weeks ago, [facility]. On [address]   Star-Rating Drugs: Atorvastatin 80 mg tablet  Jardiance 25 mg tablet  Metformin 1000 mg tablet  Rybelsus 14 mg tablet  Valsartan 320 mg tablet   Patient's preferred pharmacy is:  [facility] [place], [place] [address] Phone: [phone] Fax: [phone]  [facility], [place] [address] Phone: [phone] Fax: [phone]  [place], [place] - [address] [place] [address] Phone: [phone] Fax: [phone]  [id number] [place] [place] [address] [place]: [phone]: [phone] Uses pill box? Yes Pt endorses 95% compliance  We discussed: Benefits of medication synchronization, packaging and delivery as well as enhanced pharmacist oversight with [employer]. Patient decided to: Continue current medication management strategy  Care Plan and Follow Up Patient Decision:  Patient agrees to Care Plan and Follow-up.  Plan: The patient has been provided with contact information for the care management team and has been advised to call with any health related questions or concerns.   VOrlando Penner CPP, PharmD Clinical Pharmacist Practitioner [facility] [phone]

## 2021-11-25 NOTE — Telephone Encounter (Signed)
Opened in error note for visit already opened.   [name], CPP, PharmD Clinical Pharmacist Practitioner [facility] [phone]

## 2021-11-27 NOTE — Patient Instructions (Addendum)
Visit Information It was great speaking with you today!  Please let me know if you have any questions about our visit.   Goals Addressed             This Visit's Progress    Manage My Medicine       Timeframe:  Long-Range Goal Priority:  High Start Date:       07/07/2020 Expected End Date:                       Follow Up Date: 06/02/2022   In Progress:  - call for medicine refill 2 or 3 days before it runs out - call if I am sick and can't take my medicine - keep a list of all the medicines I take; vitamins and herbals too - use a pillbox to sort medicine - use an alarm clock or phone to remind me to take my medicine    Why is this important?   These steps will help you keep on track with your medicines. Please call if you have any questions         Patient Care Plan: CCM Pharmacy Care Plan     Problem Identified: HTN, DM II, HLD   Priority: High     Long-Range Goal: Disease Management   Start Date: 01/14/2021  Recent Progress: On track  Priority: High  Note:   Current Barriers:  Unable to independently afford treatment regimen  Pharmacist Clinical Goal(s):  Patient will achieve adherence to monitoring guidelines and medication adherence to achieve therapeutic efficacy through collaboration with PharmD and provider.   Interventions: 1:1 collaboration with [name], FNP regarding development and update of comprehensive plan of care as evidenced by provider attestation and co-signature Inter-disciplinary care team collaboration (see longitudinal plan of care) Comprehensive medication review performed; medication list updated in electronic medical record  Hypertension (BP goal <130/80) -Controlled -Current treatment: Valsartan 320 mg tablet once per day Appropriate, Effective, Safe, Accessible Metoprolol Tartrate 25 mg tablet twice per day Appropriate, Effective, Safe, Accessible -Current home readings: he is not currently checking his BP at home   -Current dietary habits: he does not add any salt to his food, he only eats salt when it is with in food  -Current exercise habits: he is walking 30 minutes in the park four days per week  -Denies hypotensive/hypertensive symptoms -Educated on BP goals and benefits of medications for prevention of heart attack, stroke and kidney damage; Daily salt intake goal < 2300 mg; -Counseled to monitor BP at home at least once per month, document, and provide log at future appointments -Recommended to continue current medication  Diabetes (A1c goal <7%) -Controlled -Current medications: Jardiance 25 mg tablet once per day Appropriate, Effective, Safe, Accessible Tresiba 55 units daily  Appropriate, Effective, Safe, Accessible Metformin 1000 mg tablet twice per day Appropriate, Effective, Safe, Accessible Rybelsus 14 mg tablet once per day  Appropriate, Effective, Safe, Accessible -Current home glucose readings fasting glucose: 135, 146, 147, 152, 155 -Denies hypoglycemic/hyperglycemic symptoms -Current meal patterns: patient reports that he has cut back on eating a lot of the foods he was eating. He is trying to cut back on the amount of [place] that he has. He is not drinking anything with sugar  -Current exercise: he is walking outside early in the morning  -Educated on Complications of diabetes including kidney damage, retinal damage, and cardiovascular disease; Proper insulin injection technique; Prevention and management of hypoglycemic episodes; -Counseled to check  feet daily and get yearly eye exams -Counseled on diet and exercise extensively Recommended to continue current medication   Hyperlipidemia: (LDL goal < 70) -Controlled -Current treatment: Atorvastatin 80 mg tablets once per day Appropriate, Effective, Safe, Accessible Ezetimibe 10 mg tablet once per day Appropriate, Effective, Safe, Accessible -Current dietary patterns: We discussed the importance of eating more  healthy, he has cut back on [place] and only having one in the morning  -Educated on Cholesterol goals;  Benefits of statin for ASCVD risk reduction; Importance of limiting foods high in cholesterol; -Recommended to continue current medication  Patient Goals/Self-Care Activities Patient will:  - take medications as prescribed as evidenced by patient report and record review  Follow Up Plan: The patient has been provided with contact information for the care management team and has been advised to call with any health related questions or concerns.       Patient agreed to services and verbal consent obtained.   The patient verbalized understanding of instructions, educational materials, and care plan provided today and agreed to receive a mailed copy of patient instructions, educational materials, and care plan.   [name], PharmD Clinical Pharmacist [facility] [phone]

## 2021-11-30 ENCOUNTER — Ambulatory Visit: Payer: Self-pay

## 2021-11-30 NOTE — Progress Notes (Signed)
This encounter was created in error - please disregard.

## 2021-11-30 NOTE — Patient Outreach (Addendum)
[facility]) Care Management  11/30/2021  NELLO CORRO Sep 24, 1955 [id number]   Care Management   Follow Up Note   11/30/2021 Name: AMEIR FARIA MRN: [id number] DOB: [birth year]   Referred by: [name], FNP Reason for referral : Care Coordination (RN CC follow up call #2)   A second unsuccessful telephone outreach was attempted today. The patient was referred to the case management team for assistance with care management and care coordination.   Follow Up Plan: Telephone follow up appointment with care management team member scheduled for: 12/18/21 '@09'$ :37 AM   Barb Merino, RN, BSN, CCM Care Management Coordinator [facility] Care Management Direct Phone: [phone]

## 2021-11-30 NOTE — Telephone Encounter (Signed)
This encounter was created in error - please disregard.

## 2021-12-01 ENCOUNTER — Other Ambulatory Visit (INDEPENDENT_AMBULATORY_CARE_PROVIDER_SITE_OTHER): Payer: Self-pay

## 2021-12-01 DIAGNOSIS — K219 Gastro-esophageal reflux disease without esophagitis: Secondary | ICD-10-CM

## 2021-12-03 ENCOUNTER — Ambulatory Visit (HOSPITAL_COMMUNITY)
Admission: RE | Admit: 2021-12-03 | Discharge: 2021-12-03 | Disposition: A | Discharge status: Home / Self Care | Payer: Medicare Other | Source: Ambulatory Visit | Attending: Nurse Practitioner | Admitting: Nurse Practitioner

## 2021-12-03 DIAGNOSIS — M791 Myalgia, unspecified site: Secondary | ICD-10-CM | POA: Diagnosis not present

## 2021-12-03 DIAGNOSIS — M47812 Spondylosis without myelopathy or radiculopathy, cervical region: Secondary | ICD-10-CM | POA: Diagnosis not present

## 2021-12-03 DIAGNOSIS — M436 Torticollis: Secondary | ICD-10-CM | POA: Diagnosis not present

## 2021-12-03 DIAGNOSIS — M2578 Osteophyte, vertebrae: Secondary | ICD-10-CM | POA: Diagnosis not present

## 2021-12-08 ENCOUNTER — Ambulatory Visit (INDEPENDENT_AMBULATORY_CARE_PROVIDER_SITE_OTHER): Payer: Medicare Other | Admitting: Physician Assistant

## 2021-12-08 ENCOUNTER — Encounter: Payer: Self-pay | Admitting: Physician Assistant

## 2021-12-08 DIAGNOSIS — F3341 Major depressive disorder, recurrent, in partial remission: Secondary | ICD-10-CM

## 2021-12-08 DIAGNOSIS — F411 Generalized anxiety disorder: Secondary | ICD-10-CM

## 2021-12-08 NOTE — Progress Notes (Signed)
[facility]  Patient ID: Robert Walters,  MRN: [id number]  PCP: [name], [place]  Date of Evaluation: 12/08/2021 Time spent:25 minutes  Chief Complaint:  Chief Complaint   Anxiety; Depression; Follow-up    Virtual Visit via Telehealth  I connected with patient by telephone, with their informed consent, and verified patient privacy and that I am speaking with the correct person using two identifiers.  I am private, in my office and the patient is at home.  He has no way of doing a video visit.  I discussed the limitations, risks, security and privacy concerns of performing an evaluation and management service by telephone and the availability of in person appointments. I also discussed with the patient that there may be a patient responsible charge related to this service. The patient expressed understanding and agreed to proceed.   I discussed the assessment and treatment plan with the patient. The patient was provided an opportunity to ask questions and all were answered. The patient agreed with the plan and demonstrated an understanding of the instructions.   The patient was advised to call back or seek an in-person evaluation if the symptoms worsen or if the condition fails to improve as anticipated.  I provided 25 minutes of non-face-to-face time during this encounter.  HISTORY/CURRENT STATUS: HPI For routine 91-monthmed check.  Feels "like usual."  He lives alone, watches tv, goes out to eat breakfast every morning, does not do much of anything else.  States he is not depressed but he does get sad because he lives alone.  His parents are both gone.  He does have an adult daughter but he did not mention how often he gets to see her.  ADLs and personal hygiene are normal.  Appetite is normal and weight is stable.  Not isolating, not crying easily.  He sleeps well most of the time.  Anxiety is still a problem, has to have the Ativan daily.  He is not having panic attacks  but more of a sense of unease, like something bad might happen.  The Ativan is really helpful.  No suicidal or homicidal thoughts.  Patient denies increased energy with decreased need for sleep, increased talkativeness, racing thoughts, impulsivity or risky behaviors, increased spending, increased libido, grandiosity, increased irritability or anger, paranoia, and no hallucinations.  Denies dizziness, syncope, seizures, numbness, tingling, tremor, tics, unsteady gait, slurred speech, confusion. Denies muscle or joint pain, stiffness, or dystonia.  Individual Medical History/ Review of Systems: Changes? :Yes   was recently started on insulin.  Past medications for mental health diagnoses include: Ambien, Ativan, Epitol, Lamictal, Prozac, Xanax, Wellbutrin  Allergies: Patient has no known allergies.  Current Medications:  Current Outpatient Medications:    alfuzosin (UROXATRAL) 10 MG 24 hr tablet, Take 1 tablet (10 mg total) by mouth daily., Disp: 30 tablet, Rfl: 2   Ascorbic Acid (VITAMIN C) 500 MG CAPS, Take 500 mg by mouth daily. , Disp: , Rfl:    aspirin EC 81 MG tablet, Take 1 tablet (81 mg total) by mouth daily., Disp: , Rfl:    atorvastatin (LIPITOR) 80 MG tablet, Take 1 tablet (80 mg total) by mouth daily., Disp: 90 tablet, Rfl: 1   b complex vitamins tablet, Take 1 tablet by mouth daily. , Disp: , Rfl:    cholecalciferol (VITAMIN D3) 25 MCG (1000 UNIT) tablet, Take 2,000 Units by mouth daily., Disp: , Rfl:    CINNAMON PO, Take 2,000 mg by mouth daily., Disp: , Rfl:  Coenzyme Q10 (COQ10) 200 MG CAPS, Take 200 mg by mouth daily., Disp: , Rfl:    empagliflozin (JARDIANCE) 25 MG TABS tablet, Take 1 tablet (25 mg total) by mouth daily before breakfast., Disp: 30 tablet, Rfl: 2   ezetimibe (ZETIA) 10 MG tablet, Take 1 tablet (10 mg total) by mouth daily., Disp: 90 tablet, Rfl: 3   FLUoxetine (PROZAC) 40 MG capsule, TAKE ONE CAPSULE BY MOUTH EVERY MORNING, Disp: 90 capsule, Rfl: 0    Garlic 811 MG CAPS, Take 500 mg by mouth daily., Disp: , Rfl:    glucose blood (ONETOUCH ULTRA) test strip, USE 1 STRIP TO CHECK GLUCOSE THREE TIMES DAILY, Disp: 100 each, Rfl: 2   insulin degludec (TRESIBA FLEXTOUCH) 100 UNIT/ML FlexTouch Pen, Inject 55 Units into the skin daily., Disp: 15 mL, Rfl: 1   Insulin Pen Needle (PEN NEEDLES) 32G X 4 MM MISC, Use as directed, Disp: 100 each, Rfl: 2   lamoTRIgine (LAMICTAL) 150 MG tablet, Take 1 tablet (150 mg total) by mouth 2 (two) times daily., Disp: 180 tablet, Rfl: 1   LORazepam (ATIVAN) 1 MG tablet, TAKE 1 TABLET BY MOUTH THREE TIMES DAILY AS NEEDED FOR ANXIETY., Disp: 270 tablet, Rfl: 0   Menthol-Methyl Salicylate (ARTHRITIS HOT) 10-15 % CREA, Apply 1 application  topically daily as needed (pain)., Disp: , Rfl:    Menthol-Methyl Salicylate (ICY HOT) 03-15 % STCK, Apply 1 application topically 3 (three) times daily as needed (pain)., Disp: , Rfl:    metFORMIN (GLUCOPHAGE) 1000 MG tablet, TAKE 1 TABLET BY MOUTH 2 TIMES DAILY WITH MEALS, Disp: 180 tablet, Rfl: 1   metoprolol tartrate (LOPRESSOR) 25 MG tablet, TAKE 1 TABLET BY MOUTH 2 TIMES DAILY GENERIC EQUIVALENT FOR LOPRESSOR., Disp: 180 tablet, Rfl: 3   Multiple Vitamin (MULTIVITAMIN WITH MINERALS) TABS tablet, Take 1 tablet by mouth daily., Disp: , Rfl:    Omega-3 Fatty Acids (FISH OIL) 1000 MG CAPS, Take 2,000 mg by mouth daily., Disp: , Rfl:    omeprazole (PRILOSEC) 40 MG capsule, Take 1 capsule (40 mg total) by mouth daily., Disp: 90 capsule, Rfl: 3   Semaglutide (RYBELSUS) 14 MG TABS, Take 1 tablet (14 mg total) by mouth daily at 6 (six) AM., Disp: 90 tablet, Rfl: 0   valsartan (DIOVAN) 320 MG tablet, Take 1 tablet (320 mg total) by mouth daily., Disp: 90 tablet, Rfl: 3 Medication Side Effects: none  Family Medical/ Social History: Changes?  No  MENTAL HEALTH EXAM:  There were no vitals taken for this visit.There is no height or weight on file to calculate BMI.  General Appearance:  unable  to assess  Eye Contact:   unable to assess  Speech:  Clear and Coherent and Normal Rate  Volume:  Normal  Mood:  Euthymic  Affect:   unable to assess  Thought Process:  Goal Directed and Descriptions of Associations: Circumstantial  Orientation:  Full (Time, Place, and Person)  Thought Content: Logical   Suicidal Thoughts:  No  Homicidal Thoughts:  No  Memory:  WNL  Judgement:  Good  Insight:  Good  Psychomotor Activity:   unable to assess  Concentration:  Concentration: Good and Attention Span: Good  Recall:  Good  Fund of Knowledge: Good  Language: Good  Assets:  Desire for Improvement  ADL's:  Intact  Cognition: WNL  Prognosis:  Good   Labs 11/16/2021 Hgb A1C 6.9 Glu 139, Cr 1.57, Alk phos 155, ALT 55 TC 103, Trig 258, HDL 31, LDL 33 PCP  follows his labs.  DIAGNOSES:    ICD-10-CM   1. Recurrent major depressive disorder, in partial remission ([place])  F33.41     2. Generalized anxiety disorder  F41.1       Receiving Psychotherapy: No   RECOMMENDATIONS:  PDMP was reviewed. Ativan last filled 11/26/2021. I provided 25 minutes of non-face-to-face time during this encounter, including time spent before and after the visit in records review, medical decision making, counseling pertinent to today's visit, and charting.   He understands that lorazepam can possibly increase risk of falls and cause confusion.  He is willing to take the chance because it does help the anxiety so much.  He's doing well as far as his mental health goes so no med changes at this time.  Continue Prozac 40 mg every morning. Continue Lamictal 150 mg 1 p.o. twice daily. Continue Ativan 1 mg, 1 p.o. 3 times daily as needed. Return in 6 months, preferably in the office since he has not been seen face-to-face for a couple of years.  Donnal Moat, PA-C

## 2021-12-10 ENCOUNTER — Other Ambulatory Visit (INDEPENDENT_AMBULATORY_CARE_PROVIDER_SITE_OTHER): Payer: Self-pay

## 2021-12-10 DIAGNOSIS — K219 Gastro-esophageal reflux disease without esophagitis: Secondary | ICD-10-CM

## 2021-12-10 MED ORDER — OMEPRAZOLE 40 MG PO CPDR: 40.0000 mg | DELAYED_RELEASE_CAPSULE | Freq: Every day | ORAL | 0 refills | Status: DC

## 2021-12-11 ENCOUNTER — Encounter: Payer: Self-pay | Admitting: Nurse Practitioner

## 2021-12-11 ENCOUNTER — Telehealth: Payer: Self-pay

## 2021-12-11 NOTE — Chronic Care Management (AMB) (Signed)
[employer] patient assistance program notification:  120- day supply of Novofine needles will be filled on 12/12/2021 and should arrive to the office in 10-14 business days.   Pattricia Boss, [place] Pharmacist Assistant [phone]

## 2021-12-16 ENCOUNTER — Encounter (HOSPITAL_COMMUNITY): Payer: Self-pay | Admitting: Physical Therapy

## 2021-12-16 ENCOUNTER — Ambulatory Visit (HOSPITAL_COMMUNITY): Payer: Medicare Other | Attending: Nurse Practitioner | Admitting: Physical Therapy

## 2021-12-16 DIAGNOSIS — M791 Myalgia, unspecified site: Secondary | ICD-10-CM | POA: Diagnosis not present

## 2021-12-16 DIAGNOSIS — R293 Abnormal posture: Secondary | ICD-10-CM | POA: Diagnosis not present

## 2021-12-16 DIAGNOSIS — M542 Cervicalgia: Secondary | ICD-10-CM | POA: Insufficient documentation

## 2021-12-16 NOTE — Therapy (Signed)
OUTPATIENT PHYSICAL THERAPY CERVICAL EVALUATION   Patient Name: Robert Walters MRN: [id number] DOB:02/10/1956, 66 y.o., male Today's Date: 12/16/2021   PT End of Session - 12/16/21 1455     Visit Number 1    Number of Visits 12    Date for PT Re-Evaluation 01/27/22    Authorization Type BCBS Medicare    Progress Note Due on Visit 10    PT Start Time 1415    PT Stop Time 1500    PT Time Calculation (min) 45 min    Activity Tolerance Patient tolerated treatment well    Behavior During Therapy WFL for tasks assessed/performed             Past Medical History:  Diagnosis Date   Acid reflux disease    Bipolar 1 disorder, mixed (HCC)    BPH (benign prostatic hypertrophy)    CVA (cerebral vascular accident) ([place]) 07/2015   Gastritis    H/O hiatal hernia    Hyperlipidemia    Hypertension    Normal nuclear stress test Dec 2011   Obesity    TIA (transient ischemic attack) 05/15/2012   "they think I've had one this am @ 0130" (05/15/2012)   Type II diabetes mellitus ([place])    Vertigo    Past Surgical History:  Procedure Laterality Date   BILIARY STENT PLACEMENT N/A 09/17/2016   Procedure: BILIARY STENT PLACEMENT;  Surgeon: [name], MD;  [facility];  Service: Endoscopy;  Laterality: N/A;   CARDIOVASCULAR STRESS TEST  2009   EF 63%   CHOLECYSTECTOMY N/A 08/25/2016   Procedure: LAPAROSCOPIC CHOLECYSTECTOMY;  Surgeon: [name], MD;  Location: [facility];  Service: General;  Laterality: N/A;   COLONOSCOPY     COLONOSCOPY WITH PROPOFOL N/A 02/03/2021   Procedure: COLONOSCOPY WITH PROPOFOL;  Surgeon: Harvel Quale, MD;  [facility]: [facility];  Service: Gastroenterology;  Laterality: N/A;    incomplete colonoscopy poor prep   COLONOSCOPY WITH PROPOFOL N/A 02/04/2021   Procedure: COLONOSCOPY WITH PROPOFOL;  Surgeon: Harvel Quale, MD;  [facility];  Service: Gastroenterology;  Laterality: N/A;  1:45   ERCP N/A 09/17/2016    Procedure: ENDOSCOPIC RETROGRADE CHOLANGIOPANCREATOGRAPHY (ERCP);  Surgeon: [name], MD;  [facility];  Service: Endoscopy;  Laterality: N/A;  730   ERCP N/A 12/03/2016   Procedure: ENDOSCOPIC RETROGRADE CHOLANGIOPANCREATOGRAPHY (ERCP);  Surgeon: [name], MD;  [facility];  Service: Gastroenterology;  Laterality: N/A;   ESOPHAGEAL DILATION  02/03/2021   Procedure: ESOPHAGEAL DILATION;  Surgeon: Montez Morita, Quillian Quince, MD;  [facility];  Service: Gastroenterology;;  savory 16 and 18 fr   ESOPHAGOGASTRODUODENOSCOPY     ESOPHAGOGASTRODUODENOSCOPY (EGD) WITH PROPOFOL N/A 02/03/2021   Procedure: ESOPHAGOGASTRODUODENOSCOPY (EGD) WITH PROPOFOL;  Surgeon: Harvel Quale, MD;  [facility];  Service: Gastroenterology;  Laterality: N/A;   GASTROINTESTINAL STENT REMOVAL N/A 12/03/2016   Procedure: BILIARY STENT REMOVAL;  Surgeon: [name], MD;  [facility];  Service: Gastroenterology;  Laterality: N/A;   NO PAST SURGERIES     POLYPECTOMY  02/04/2021   Procedure: POLYPECTOMY INTESTINAL;  Surgeon: Harvel Quale, MD;  [facility];  Service: Gastroenterology;;   SPHINCTEROTOMY N/A 09/17/2016   Procedure: Joan Mayans;  Surgeon: [name], MD;  [facility];  Service: Endoscopy;  Laterality: N/A;   Patient Active Problem List   Diagnosis Date Noted   NASH (nonalcoholic steatohepatitis) 11/20/2020  Dysphagia 11/20/2020   Chronic diastolic heart failure ([place]) 03/27/2019   Fatty liver 12/26/2018   Elevated LFTs 12/26/2018   History of colonic polyps 12/26/2018   Fall 08/28/2018   Rib pain on left side 08/28/2018   Anxiety 03/31/2018   Bipolar II disorder ([place]) 03/31/2018   Insomnia 03/31/2018   Type 2 diabetes mellitus without complication, without long-term current use of insulin ([place]) 03/24/2018   Bile leak 09/15/2016   Sepsis ([place]) 08/23/2016   Cholecystitis     Stroke ([place]) 07/25/2015   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 07/25/2015   Dizziness 08/09/2014   TIA (transient ischemic attack) 05/15/2012   Blurred vision 05/15/2012   Ataxia 05/15/2012   Diabetes mellitus type 2 with complications ([place]) 32/95/1884   BPH (benign prostatic hyperplasia) 05/15/2012   GERD (gastroesophageal reflux disease) 05/15/2012   Obesity 05/15/2012   Bipolar 1 disorder ([place]) 05/15/2012   Essential hypertension 10/30/2010   Cardiovascular risk factor 10/30/2010   Pure hypercholesterolemia 12/05/2008    PCP: [name] FNP  REFERRING PROVIDER: [name], FNP   REFERRING DIAG: M79.10 (ICD-10-CM) - Muscle tension pain   THERAPY DIAG:  Cervicalgia  Abnormal posture  Rationale for Evaluation and Treatment Rehabilitation  ONSET DATE: around January/ February 2023  SUBJECTIVE:                                                                                                                                                                                                         SUBJECTIVE STATEMENT: Patient presents to therapy with complaint of back pain. He has had this off and on for some time, but has gotten works in the last six months. He has had therapy before which was somewhat helpful but he quit going. He denies taking medication for this. He has recent radiographs which show mild degeneration.    PERTINENT HISTORY:    NA  PAIN:  Are you having pain? Yes: NPRS scale: 6/10 Pain location: neck Pain description: aching Aggravating factors: turning head  Relieving factors: sitting still, looking straight ahead   PRECAUTIONS: None  WEIGHT BEARING RESTRICTIONS No  FALLS:  Has patient fallen in last 6 months? Yes. Number of falls 1  LIVING ENVIRONMENT: Lives with: lives with their family and lives alone Lives in: Mobile home Stairs: No Has following equipment at home: Single point cane  OCCUPATION: Retired   PLOF: Independent with basic  ADLs  PATIENT GOALS "Relieve the pain"   OBJECTIVE:   DIAGNOSTIC FINDINGS:  IMPRESSION: Mild degenerative joint changes of cervical spine.  PATIENT SURVEYS:  NDI  40% disability    COGNITION: Overall cognitive status: Within functional limits for tasks assessed   SENSATION: WFL  POSTURE: rounded shoulders and forward head  PALPATION: Mod TTP about RT lateral cervical paraspinals, upper trap   CERVICAL ROM:   Active ROM A/PROM (deg) eval  Flexion 47  Extension 32  Right lateral flexion   Left lateral flexion   Right rotation 43  Left rotation 32   (Blank rows = not tested)  UPPER EXTREMITY ROM:  BUE WNL  UPPER EXTREMITY MMT:  MMT Right eval Left eval  Shoulder flexion 4+ 4+  Shoulder extension    Shoulder abduction 4+ 4+  Shoulder adduction    Shoulder extension    Shoulder internal rotation    Shoulder external rotation 4+ 4+  Middle trapezius    Lower trapezius    Elbow flexion    Elbow extension    Wrist flexion    Wrist extension    Wrist ulnar deviation    Wrist radial deviation    Wrist pronation    Wrist supination    Grip strength     (Blank rows = not tested)   TODAY'S TREATMENT:   Eval Scap retraction Chin tuck NDI   PATIENT EDUCATION:  Education details: on eval findings, POC and HEP  Person educated: Patient Education method: Explanation Education comprehension: verbalized understanding   HOME EXERCISE PROGRAM: Access Code: [email or web address] URL: [email or web address] Date: 12/16/2021 Prepared by: Josue Hector  Exercises - Seated Scapular Retraction  - 2 x daily - 7 x weekly - 1-2 sets - 10 reps - Seated Cervical Retraction  - 2 x daily - 7 x weekly - 1-2 sets - 10 reps - Seated Upper Trapezius Stretch  - 2 x daily - 7 x weekly - 1 sets - 3 reps - 30 second hold  ASSESSMENT:  CLINICAL IMPRESSION: Patient is a 66 y.o. male who presents to physical therapy with complaint of neck pain. Patient demonstrates  decreased strength, ROM restriction, reduced flexibility, increased tenderness to palpation and postural abnormalities which are likely contributing to symptoms of pain and are negatively impacting patient ability to perform ADLs. Patient will benefit from skilled physical therapy services to address these deficits to reduce pain and improve level of function with ADLs   OBJECTIVE IMPAIRMENTS decreased activity tolerance, decreased mobility, decreased ROM, decreased strength, hypomobility, increased fascial restrictions, impaired flexibility, impaired UE functional use, improper body mechanics, postural dysfunction, and pain.   ACTIVITY LIMITATIONS carrying, lifting, sitting, sleeping, and reach over head  PARTICIPATION LIMITATIONS: meal prep, cleaning, laundry, driving, shopping, community activity, and yard work  PERSONAL FACTORS Past/current experiences and Time since onset of injury/illness/exacerbation are also affecting patient's functional outcome.   REHAB POTENTIAL: Good  CLINICAL DECISION MAKING: Stable/uncomplicated  EVALUATION COMPLEXITY: Low   GOALS: SHORT TERM GOALS: Target date: 01/06/2022  Patient will be independent with initial HEP and self-management strategies to improve functional outcomes Baseline:  Goal status: INITIAL    LONG TERM GOALS: Target date: 01/27/2022  Patient will be independent with advanced HEP and self-management strategies to improve functional outcomes Baseline:  Goal status: INITIAL  2.  Patient will improve NDI by at least 15% value to indicate improvement in functional outcomes Baseline: 40% disability Goal status: INITIAL  3.  Patient will demo improved LT cervical rotation by at least 10 degrees in order to improve ability to scan environment for safety and while driving. Baseline: See AROM Goal status: INITIAL  4. Patient will report a decrease in  neck pain to no more than 3/10 for improved quality of life and ability to perform UE ADLs   Baseline: 6/10 Goal status: INITIAL  PLAN: PT FREQUENCY: 2x/week  PT DURATION: 6 weeks  PLANNED INTERVENTIONS: Therapeutic exercises, Therapeutic activity, Neuromuscular re-education, Balance training, Gait training, Patient/Family education, Joint manipulation, Joint mobilization, Stair training, Aquatic Therapy, Dry Needling, Electrical stimulation, Spinal manipulation, Spinal mobilization, Cryotherapy, Moist heat, scar mobilization, Taping, Traction, Ultrasound, Biofeedback, Ionotophoresis 36m/ml Dexamethasone, and Manual therapy.   PLAN FOR NEXT SESSION: Progress postural strength and pain free cervical AROM as tolerated. Manual as needed to address pain and restriction.   2:57 PM, 12/16/21 CJosue HectorPT DPT  Physical Therapist with [facility] ([phone]

## 2021-12-18 ENCOUNTER — Ambulatory Visit: Payer: Self-pay

## 2021-12-18 NOTE — Patient Outreach (Signed)
  Care Coordination   12/18/2021 Name: Robert Walters MRN: [id number] DOB: 10-Sep-1955   Care Coordination Outreach Attempts:  A third unsuccessful outreach was attempted today to offer the patient with information about available care coordination services as a benefit of their health plan.   Follow Up Plan:  No further outreach attempts will be made at this time. We have been unable to contact the patient to offer or enroll patient in care coordination services  Encounter Outcome:  No Answer  Care Coordination Interventions Activated:  No   Care Coordination Interventions:  No, not indicated    Barb Merino, RN, BSN, CCM Care Management Coordinator [facility] Management Direct Phone: [phone]

## 2021-12-21 ENCOUNTER — Other Ambulatory Visit: Payer: Self-pay | Admitting: Nurse Practitioner

## 2021-12-22 ENCOUNTER — Ambulatory Visit (HOSPITAL_COMMUNITY): Payer: Medicare Other | Admitting: Physical Therapy

## 2021-12-22 DIAGNOSIS — M791 Myalgia, unspecified site: Secondary | ICD-10-CM | POA: Diagnosis not present

## 2021-12-22 DIAGNOSIS — R293 Abnormal posture: Secondary | ICD-10-CM | POA: Diagnosis not present

## 2021-12-22 DIAGNOSIS — M542 Cervicalgia: Secondary | ICD-10-CM | POA: Diagnosis not present

## 2021-12-22 NOTE — Therapy (Signed)
OUTPATIENT PHYSICAL THERAPY TREATMENT NOTE   Patient Name: Robert Walters MRN: [id number] DOB:October 16, 1955, 66 y.o., male Today's Date: 12/22/2021  PCP: [name], FNP REFERRING PROVIDER: [name], FNP  END OF SESSION:   PT End of Session - 12/22/21 0903     Visit Number 2    Number of Visits 12    Date for PT Re-Evaluation 01/27/22    Authorization Type BCBS Medicare    Progress Note Due on Visit 10    PT Start Time 0902    PT Stop Time 0940    PT Time Calculation (min) 38 min    Activity Tolerance Patient tolerated treatment well    Behavior During Therapy [facility] for tasks assessed/performed             Past Medical History:  Diagnosis Date   Acid reflux disease    Bipolar 1 disorder, mixed (HCC)    BPH (benign prostatic hypertrophy)    CVA (cerebral vascular accident) ([place]) 07/2015   Gastritis    H/O hiatal hernia    Hyperlipidemia    Hypertension    Normal nuclear stress test Dec 2011   Obesity    TIA (transient ischemic attack) 05/15/2012   "they think I've had one this am @ 0130" (05/15/2012)   Type II diabetes mellitus ([place])    Vertigo    Past Surgical History:  Procedure Laterality Date   BILIARY STENT PLACEMENT N/A 09/17/2016   Procedure: BILIARY STENT PLACEMENT;  Surgeon: [name], MD;  [facility];  Service: Endoscopy;  Laterality: N/A;   CARDIOVASCULAR STRESS TEST  2009   EF 63%   CHOLECYSTECTOMY N/A 08/25/2016   Procedure: LAPAROSCOPIC CHOLECYSTECTOMY;  Surgeon: [name], MD;  Location: [facility] ORS;  Service: General;  Laterality: N/A;   COLONOSCOPY     COLONOSCOPY WITH PROPOFOL N/A 02/03/2021   Procedure: COLONOSCOPY WITH PROPOFOL;  Surgeon: Harvel Quale, MD;  [facility];  Service: Gastroenterology;  Laterality: N/A;    incomplete colonoscopy poor prep   COLONOSCOPY WITH PROPOFOL N/A 02/04/2021   Procedure: COLONOSCOPY WITH PROPOFOL;  Surgeon: Harvel Quale, MD;  [facility];   Service: Gastroenterology;  Laterality: N/A;  1:45   ERCP N/A 09/17/2016   Procedure: ENDOSCOPIC RETROGRADE CHOLANGIOPANCREATOGRAPHY (ERCP);  Surgeon: [name], MD;  [facility];  Service: Endoscopy;  Laterality: N/A;  730   ERCP N/A 12/03/2016   Procedure: ENDOSCOPIC RETROGRADE CHOLANGIOPANCREATOGRAPHY (ERCP);  Surgeon: [name], MD;  [facility];  Service: Gastroenterology;  Laterality: N/A;   ESOPHAGEAL DILATION  02/03/2021   Procedure: ESOPHAGEAL DILATION;  Surgeon: Montez Morita, Quillian Quince, MD;  [facility];  Service: Gastroenterology;;  savory 16 and 18 fr   ESOPHAGOGASTRODUODENOSCOPY     ESOPHAGOGASTRODUODENOSCOPY (EGD) WITH PROPOFOL N/A 02/03/2021   Procedure: ESOPHAGOGASTRODUODENOSCOPY (EGD) WITH PROPOFOL;  Surgeon: Harvel Quale, MD;  [facility];  Service: Gastroenterology;  Laterality: N/A;   GASTROINTESTINAL STENT REMOVAL N/A 12/03/2016   Procedure: BILIARY STENT REMOVAL;  Surgeon: [name], MD;  [facility];  Service: Gastroenterology;  Laterality: N/A;   NO PAST SURGERIES     POLYPECTOMY  02/04/2021   Procedure: POLYPECTOMY INTESTINAL;  Surgeon: Harvel Quale, MD;  [facility];  Service: Gastroenterology;;   SPHINCTEROTOMY N/A 09/17/2016   Procedure: Joan Mayans;  Surgeon: [name], MD;  [facility];  Service: Endoscopy;  Laterality: N/A;   Patient  Active Problem List   Diagnosis Date Noted   NASH (nonalcoholic steatohepatitis) 11/20/2020   Dysphagia 11/20/2020   Chronic diastolic heart failure ([place]) 03/27/2019   Fatty liver 12/26/2018   Elevated LFTs 12/26/2018   History of colonic polyps 12/26/2018   Fall 08/28/2018   Rib pain on left side 08/28/2018   Anxiety 03/31/2018   Bipolar II disorder ([place]) 03/31/2018   Insomnia 03/31/2018   Type 2 diabetes mellitus without complication, without long-term current use of insulin ([place]) 03/24/2018    Bile leak 09/15/2016   Sepsis ([place]) 08/23/2016   Cholecystitis    Stroke ([place]) 07/25/2015   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 07/25/2015   Dizziness 08/09/2014   TIA (transient ischemic attack) 05/15/2012   Blurred vision 05/15/2012   Ataxia 05/15/2012   Diabetes mellitus type 2 with complications ([place]) 63/84/6659   BPH (benign prostatic hyperplasia) 05/15/2012   GERD (gastroesophageal reflux disease) 05/15/2012   Obesity 05/15/2012   Bipolar 1 disorder ([place]) 05/15/2012   Essential hypertension 10/30/2010   Cardiovascular risk factor 10/30/2010   Pure hypercholesterolemia 12/05/2008    REFERRING DIAG: M79.10 (ICD-10-CM) - Muscle tension pain  THERAPY DIAG:  Cervicalgia  Abnormal posture  Rationale for Evaluation and Treatment Rehabilitation  PERTINENT HISTORY: NA  PRECAUTIONS: None  SUBJECTIVE: Patient reports increased soreness in neck following Eval. He says he has been doing HEP but neck is sore. He had trouble moving his neck the days following. He is doing a little better today but still very sore.   PAIN:  Are you having pain? Yes: NPRS scale: 6/10 Pain location: neck Pain description: aching Aggravating factors: turning head  Relieving factors: sitting still, looking straight ahead   OBJECTIVE:    DIAGNOSTIC FINDINGS:  IMPRESSION: Mild degenerative joint changes of cervical spine.   PATIENT SURVEYS:  NDI 40% disability      COGNITION: Overall cognitive status: Within functional limits for tasks assessed     SENSATION: WFL   POSTURE: rounded shoulders and forward head   PALPATION: Mod TTP about RT lateral cervical paraspinals, upper trap    CERVICAL ROM:    Active ROM A/PROM (deg) eval  Flexion 47  Extension 32  Right lateral flexion    Left lateral flexion    Right rotation 43  Left rotation 32   (Blank rows = not tested)   UPPER EXTREMITY ROM:   BUE WNL   UPPER EXTREMITY MMT:   MMT Right eval Left eval  Shoulder flexion 4+ 4+   Shoulder extension      Shoulder abduction 4+ 4+  Shoulder adduction      Shoulder extension      Shoulder internal rotation      Shoulder external rotation 4+ 4+  Middle trapezius      Lower trapezius      Elbow flexion      Elbow extension      Wrist flexion      Wrist extension      Wrist ulnar deviation      Wrist radial deviation      Wrist pronation      Wrist supination      Grip strength       (Blank rows = not tested)     TODAY'S TREATMENT:    12/22/21 Chin tuck 10 x 5"  Cervical rotation x10 Cervical flexion/ extension x10 Scap retraction 10 x 5"  Seated upper stretch 3 x 30"   Manual IASTM to bilateral upper trap  Eval Scap retraction Chin tuck NDI     PATIENT EDUCATION:  Education details: on eval findings, POC and HEP  Person educated: Patient Education method: Explanation Education comprehension: verbalized understanding     HOME EXERCISE PROGRAM: Access Code: [email or web address] URL: [email or web address] Date: 12/16/2021 Prepared by: Josue Hector   Exercises - Seated Scapular Retraction  - 2 x daily - 7 x weekly - 1-2 sets - 10 reps - Seated Cervical Retraction  - 2 x daily - 7 x weekly - 1-2 sets - 10 reps - Seated Upper Trapezius Stretch  - 2 x daily - 7 x weekly - 1 sets - 3 reps - 30 second hold   ASSESSMENT:   CLINICAL IMPRESSION: Reviewed therapy goal and HEP. Patient required verbal cueing and demo for proper form with home exercises. Focused on improved form with home exercise and added cervical AROM in pain free range. Performed manual IASTM to address pain and restriction with good result. Patient continues to demo postural limitations. Patient will continue to benefit from skilled therapy services to reduce remaining deficits and improve functional ability.     OBJECTIVE IMPAIRMENTS decreased activity tolerance, decreased mobility, decreased ROM, decreased strength, hypomobility, increased fascial restrictions,  impaired flexibility, impaired UE functional use, improper body mechanics, postural dysfunction, and pain.    ACTIVITY LIMITATIONS carrying, lifting, sitting, sleeping, and reach over head   PARTICIPATION LIMITATIONS: meal prep, cleaning, laundry, driving, shopping, community activity, and yard work   PERSONAL FACTORS Past/current experiences and Time since onset of injury/illness/exacerbation are also affecting patient's functional outcome.    REHAB POTENTIAL: Good   CLINICAL DECISION MAKING: Stable/uncomplicated   EVALUATION COMPLEXITY: Low     GOALS: SHORT TERM GOALS: Target date: 01/06/2022   Patient will be independent with initial HEP and self-management strategies to improve functional outcomes Baseline:  Goal status: INITIAL      LONG TERM GOALS: Target date: 01/27/2022   Patient will be independent with advanced HEP and self-management strategies to improve functional outcomes Baseline:  Goal status: INITIAL   2.  Patient will improve NDI by at least 15% value to indicate improvement in functional outcomes Baseline: 40% disability Goal status: INITIAL   3.  Patient will demo improved LT cervical rotation by at least 10 degrees in order to improve ability to scan environment for safety and while driving. Baseline: See AROM Goal status: INITIAL   4. Patient will report a decrease in neck pain to no more than 3/10 for improved quality of life and ability to perform UE ADLs  Baseline: 6/10 Goal status: INITIAL   PLAN: PT FREQUENCY: 2x/week   PT DURATION: 6 weeks   PLANNED INTERVENTIONS: Therapeutic exercises, Therapeutic activity, Neuromuscular re-education, Balance training, Gait training, Patient/Family education, Joint manipulation, Joint mobilization, Stair training, Aquatic Therapy, Dry Needling, Electrical stimulation, Spinal manipulation, Spinal mobilization, Cryotherapy, Moist heat, scar mobilization, Taping, Traction, Ultrasound, Biofeedback, Ionotophoresis  67m/ml Dexamethasone, and Manual therapy.     PLAN FOR NEXT SESSION: Progress postural strength and pain free cervical AROM as tolerated. Manual as needed to address pain and restriction.     9:05 AM, 12/22/21 CJosue HectorPT DPT  [facility] ([phone]

## 2021-12-24 ENCOUNTER — Encounter (HOSPITAL_COMMUNITY): Payer: Medicare Other

## 2021-12-30 ENCOUNTER — Encounter (HOSPITAL_COMMUNITY): Payer: Self-pay | Admitting: Physical Therapy

## 2021-12-30 ENCOUNTER — Ambulatory Visit (HOSPITAL_COMMUNITY): Payer: Medicare Other | Admitting: Physical Therapy

## 2021-12-30 DIAGNOSIS — M542 Cervicalgia: Secondary | ICD-10-CM

## 2021-12-30 DIAGNOSIS — M791 Myalgia, unspecified site: Secondary | ICD-10-CM | POA: Diagnosis not present

## 2021-12-30 DIAGNOSIS — R293 Abnormal posture: Secondary | ICD-10-CM

## 2021-12-30 NOTE — Therapy (Signed)
OUTPATIENT PHYSICAL THERAPY TREATMENT NOTE   Patient Name: Robert Walters MRN: [id number] DOB:1955-05-06, 66 y.o., male Today's Date: 12/30/2021  PCP: [name], FNP REFERRING PROVIDER: [name], FNP  END OF SESSION:   PT End of Session - 12/30/21 1527     Visit Number 3    Number of Visits 12    Date for PT Re-Evaluation 01/27/22    Authorization Type BCBS Medicare    Progress Note Due on Visit 10    PT Start Time 1523    PT Stop Time 1601    PT Time Calculation (min) 38 min    Activity Tolerance Patient tolerated treatment well    Behavior During Therapy WFL for tasks assessed/performed             Past Medical History:  Diagnosis Date   Acid reflux disease    Bipolar 1 disorder, mixed ([place])    BPH (benign prostatic hypertrophy)    CVA (cerebral vascular accident) ([place]) 07/2015   Gastritis    H/O hiatal hernia    Hyperlipidemia    Hypertension    Normal nuclear stress test Dec 2011   Obesity    TIA (transient ischemic attack) 05/15/2012   "they think I've had one this am @ 0130" (05/15/2012)   Type II diabetes mellitus ([place])    Vertigo    Past Surgical History:  Procedure Laterality Date   BILIARY STENT PLACEMENT N/A 09/17/2016   Procedure: BILIARY STENT PLACEMENT;  Surgeon: [name], MD;  [facility];  Service: Endoscopy;  Laterality: N/A;   CARDIOVASCULAR STRESS TEST  2009   EF 63%   CHOLECYSTECTOMY N/A 08/25/2016   Procedure: LAPAROSCOPIC CHOLECYSTECTOMY;  Surgeon: [name], MD;  Location: [facility];  Service: General;  Laterality: N/A;   COLONOSCOPY     COLONOSCOPY WITH PROPOFOL N/A 02/03/2021   Procedure: COLONOSCOPY WITH PROPOFOL;  Surgeon: Harvel Quale, MD;  [facility];  Service: Gastroenterology;  Laterality: N/A;    incomplete colonoscopy poor prep   COLONOSCOPY WITH PROPOFOL N/A 02/04/2021   Procedure: COLONOSCOPY WITH PROPOFOL;  Surgeon: Harvel Quale, MD;  [facility]: [facility];   Service: Gastroenterology;  Laterality: N/A;  1:45   ERCP N/A 09/17/2016   Procedure: ENDOSCOPIC RETROGRADE CHOLANGIOPANCREATOGRAPHY (ERCP);  Surgeon: [name], MD;  [facility];  Service: Endoscopy;  Laterality: N/A;  730   ERCP N/A 12/03/2016   Procedure: ENDOSCOPIC RETROGRADE CHOLANGIOPANCREATOGRAPHY (ERCP);  Surgeon: [name], MD;  [facility]: [facility];  Service: Gastroenterology;  Laterality: N/A;   ESOPHAGEAL DILATION  02/03/2021   Procedure: ESOPHAGEAL DILATION;  Surgeon: Montez Morita, Quillian Quince, MD;  [facility]: [facility];  Service: Gastroenterology;;  savory 16 and 18 fr   ESOPHAGOGASTRODUODENOSCOPY     ESOPHAGOGASTRODUODENOSCOPY (EGD) WITH PROPOFOL N/A 02/03/2021   Procedure: ESOPHAGOGASTRODUODENOSCOPY (EGD) WITH PROPOFOL;  Surgeon: Harvel Quale, MD;  [facility];  Service: Gastroenterology;  Laterality: N/A;   GASTROINTESTINAL STENT REMOVAL N/A 12/03/2016   Procedure: BILIARY STENT REMOVAL;  Surgeon: [name], MD;  [facility];  Service: Gastroenterology;  Laterality: N/A;   NO PAST SURGERIES     POLYPECTOMY  02/04/2021   Procedure: POLYPECTOMY INTESTINAL;  Surgeon: Harvel Quale, MD;  [facility];  Service: Gastroenterology;;   SPHINCTEROTOMY N/A 09/17/2016   Procedure: Joan Mayans;  Surgeon: [name], MD;  [facility];  Service: Endoscopy;  Laterality: N/A;   Patient  Active Problem List   Diagnosis Date Noted   NASH (nonalcoholic steatohepatitis) 11/20/2020   Dysphagia 11/20/2020   Chronic diastolic heart failure ([place]) 03/27/2019   Fatty liver 12/26/2018   Elevated LFTs 12/26/2018   History of colonic polyps 12/26/2018   Fall 08/28/2018   Rib pain on left side 08/28/2018   Anxiety 03/31/2018   Bipolar II disorder ([place]) 03/31/2018   Insomnia 03/31/2018   Type 2 diabetes mellitus without complication, without long-term current use of insulin ([place]) 03/24/2018    Bile leak 09/15/2016   Sepsis ([place]) 08/23/2016   Cholecystitis    Stroke ([place]) 07/25/2015   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 07/25/2015   Dizziness 08/09/2014   TIA (transient ischemic attack) 05/15/2012   Blurred vision 05/15/2012   Ataxia 05/15/2012   Diabetes mellitus type 2 with complications ([place]) 65/99/3570   BPH (benign prostatic hyperplasia) 05/15/2012   GERD (gastroesophageal reflux disease) 05/15/2012   Obesity 05/15/2012   Bipolar 1 disorder ([place]) 05/15/2012   Essential hypertension 10/30/2010   Cardiovascular risk factor 10/30/2010   Pure hypercholesterolemia 12/05/2008    REFERRING DIAG: M79.10 (ICD-10-CM) - Muscle tension pain  THERAPY DIAG:  Cervicalgia  Abnormal posture  Rationale for Evaluation and Treatment Rehabilitation  PERTINENT HISTORY: NA  PRECAUTIONS: None  SUBJECTIVE: Patient says he was very sore. Neck pain is 7. Felt good last time but feels exercises make him sore. He tries to do them when he can.   PAIN:  Are you having pain? Yes: NPRS scale: 7/10 Pain location: neck Pain description: aching Aggravating factors: turning head  Relieving factors: sitting still, looking straight ahead   OBJECTIVE:    DIAGNOSTIC FINDINGS:  IMPRESSION: Mild degenerative joint changes of cervical spine.   PATIENT SURVEYS:  NDI 40% disability      COGNITION: Overall cognitive status: Within functional limits for tasks assessed     SENSATION: WFL   POSTURE: rounded shoulders and forward head   PALPATION: Mod TTP about RT lateral cervical paraspinals, upper trap    CERVICAL ROM:    Active ROM A/PROM (deg) eval  Flexion 47  Extension 32  Right lateral flexion    Left lateral flexion    Right rotation 43  Left rotation 32   (Blank rows = not tested)   UPPER EXTREMITY ROM:   BUE WNL   UPPER EXTREMITY MMT:   MMT Right eval Left eval  Shoulder flexion 4+ 4+  Shoulder extension      Shoulder abduction 4+ 4+  Shoulder adduction       Shoulder extension      Shoulder internal rotation      Shoulder external rotation 4+ 4+  Middle trapezius      Lower trapezius      Elbow flexion      Elbow extension      Wrist flexion      Wrist extension      Wrist ulnar deviation      Wrist radial deviation      Wrist pronation      Wrist supination      Grip strength       (Blank rows = not tested)     TODAY'S TREATMENT:  12/30/21 Seated: Chin tuck 10 x 5"  Cervical rotation x10 Scap retraction 10 x 5"  Seated upper stretch 3 x 30"   Supine: Chin tuck 10 x 5" Scap 10 x 5"  Manual IASTM to bilateral upper trap   12/22/21 Chin tuck 10 x 5"  Cervical rotation x10 Cervical flexion/ extension x10 Scap retraction 10 x 5"  Seated upper stretch 3 x 30"   Manual IASTM to bilateral upper trap   Eval Scap retraction Chin tuck NDI     PATIENT EDUCATION:  Education details: on eval findings, POC and HEP  Person educated: Patient Education method: Explanation Education comprehension: verbalized understanding     HOME EXERCISE PROGRAM: Access Code: [email or web address] URL: [email or web address]  12/30/21 Access Code: [email or web address] URL: [email or web address] Date: 12/30/2021 Prepared by: Candie Mile  Exercises - Seated Upper Trapezius Stretch  - 2 x daily - 7 x weekly - 1 sets - 3 reps - 30 second hold - Supine Passive Cervical Retraction  - 2 x daily - 7 x weekly - 2 sets - 10 reps - 5 second hold - Supine Scapular Retraction  - 2 x daily - 7 x weekly - 2 sets - 10 reps - 5 second hold - Supine Cervical Rotation AROM on Pillow  - 2 x daily - 7 x weekly - 2 sets - 10 reps Date: 12/16/2021 Prepared by: Josue Hector   Exercises - Seated Scapular Retraction  - 2 x daily - 7 x weekly - 1-2 sets - 10 reps - Seated Cervical Retraction  - 2 x daily - 7 x weekly - 1-2 sets - 10 reps - Seated Upper Trapezius Stretch  - 2 x daily - 7 x weekly - 1 sets - 3 reps - 30 second hold   ASSESSMENT:    CLINICAL IMPRESSION: Patient requires frequent verbal cues for proper form with previous exercise. Modified chin tuck and scap retraction to supine position with improved comfort. Updated HEP to reflect this change. Continued with manual STM to address pain and restriction with good result. Patient will continue to benefit from skilled therapy services to reduce remaining deficits and improve functional ability.      OBJECTIVE IMPAIRMENTS decreased activity tolerance, decreased mobility, decreased ROM, decreased strength, hypomobility, increased fascial restrictions, impaired flexibility, impaired UE functional use, improper body mechanics, postural dysfunction, and pain.    ACTIVITY LIMITATIONS carrying, lifting, sitting, sleeping, and reach over head   PARTICIPATION LIMITATIONS: meal prep, cleaning, laundry, driving, shopping, community activity, and yard work   PERSONAL FACTORS Past/current experiences and Time since onset of injury/illness/exacerbation are also affecting patient's functional outcome.    REHAB POTENTIAL: Good   CLINICAL DECISION MAKING: Stable/uncomplicated   EVALUATION COMPLEXITY: Low     GOALS: SHORT TERM GOALS: Target date: 01/06/2022   Patient will be independent with initial HEP and self-management strategies to improve functional outcomes Baseline:  Goal status: INITIAL      LONG TERM GOALS: Target date: 01/27/2022   Patient will be independent with advanced HEP and self-management strategies to improve functional outcomes Baseline:  Goal status: INITIAL   2.  Patient will improve NDI by at least 15% value to indicate improvement in functional outcomes Baseline: 40% disability Goal status: INITIAL   3.  Patient will demo improved LT cervical rotation by at least 10 degrees in order to improve ability to scan environment for safety and while driving. Baseline: See AROM Goal status: INITIAL   4. Patient will report a decrease in neck pain to no more  than 3/10 for improved quality of life and ability to perform UE ADLs  Baseline: 6/10 Goal status: INITIAL   PLAN: PT FREQUENCY: 2x/week   PT DURATION: 6 weeks   PLANNED INTERVENTIONS: Therapeutic exercises, Therapeutic activity, Neuromuscular re-education, Balance training, Gait training, Patient/Family education,  Joint manipulation, Joint mobilization, Stair training, Aquatic Therapy, Dry Needling, Electrical stimulation, Spinal manipulation, Spinal mobilization, Cryotherapy, Moist heat, scar mobilization, Taping, Traction, Ultrasound, Biofeedback, Ionotophoresis 34m/ml Dexamethasone, and Manual therapy.     PLAN FOR NEXT SESSION: Progress postural strength and pain free cervical AROM as tolerated. Manual as needed to address pain and restriction.     3:27 PM, 12/30/21 CJosue HectorPT DPT  [facility] ([phone]

## 2021-12-31 ENCOUNTER — Telehealth: Payer: Self-pay

## 2021-12-31 NOTE — Chronic Care Management (AMB) (Addendum)
12-31-2021: Patient informed that needles delivery is ready for pick up.  [place] Pharmacist Assistant [phone]

## 2022-01-01 ENCOUNTER — Encounter (HOSPITAL_COMMUNITY): Payer: Medicare Other

## 2022-01-01 ENCOUNTER — Telehealth (HOSPITAL_COMMUNITY): Payer: Self-pay

## 2022-01-01 NOTE — Telephone Encounter (Signed)
No show, called and spoke to pt. who stated he called earlier to cancel apt today.  Pt stated he is very sore today, encouraged to keep moving to reduce increased soreness.  Reminded next apt date and time and encouraged to call and cancel/reschedule if unable to make further apts.   Ihor Austin, LPTA/CLT; Delana Meyer [phone]

## 2022-01-05 ENCOUNTER — Telehealth: Payer: Self-pay

## 2022-01-05 ENCOUNTER — Telehealth: Payer: Medicare Other

## 2022-01-05 NOTE — Chronic Care Management (AMB) (Addendum)
[employer] patient assistance program notification:  120- day supply of Novofine needles was filled on 12/15/2021 and should arrive to the office in 10-14 business days. Patient has 0  refill remaining and enrollment will expire on 04/01/2022.  Next refill will be fulfilled on 03/03/2022.  No further actions required, patient will be due for re-enrollment.  Pattricia Boss, [place] Pharmacist Assistant [phone]

## 2022-01-06 ENCOUNTER — Encounter (HOSPITAL_COMMUNITY): Payer: Medicare Other | Admitting: Physical Therapy

## 2022-01-12 ENCOUNTER — Ambulatory Visit (HOSPITAL_COMMUNITY): Payer: Medicare Other

## 2022-01-13 ENCOUNTER — Telehealth: Payer: Self-pay

## 2022-01-13 NOTE — Chronic Care Management (AMB) (Signed)
[employer] patient assistance program notification:  120- day supply of Rybelsus 14 mg 30ct was sent on 8/292023 and  should arrive to the office in 10-14 business days. Patient has 2  refill remaining and enrollment will expire on 04/01/2022.  The next refill for patient will be fulfilled on 02/10/2022.   Pattricia Boss, [place] Pharmacist Assistant [phone]

## 2022-01-14 ENCOUNTER — Ambulatory Visit (HOSPITAL_COMMUNITY): Payer: Medicare Other

## 2022-01-14 ENCOUNTER — Telehealth (HOSPITAL_COMMUNITY): Payer: Self-pay | Admitting: Physical Therapy

## 2022-01-14 ENCOUNTER — Other Ambulatory Visit: Payer: Self-pay | Admitting: Nurse Practitioner

## 2022-01-14 DIAGNOSIS — N183 Chronic kidney disease, stage 3 unspecified: Secondary | ICD-10-CM

## 2022-01-14 DIAGNOSIS — E119 Type 2 diabetes mellitus without complications: Secondary | ICD-10-CM

## 2022-01-14 NOTE — Telephone Encounter (Signed)
Patient requested to be D/c today

## 2022-01-19 ENCOUNTER — Encounter (HOSPITAL_COMMUNITY): Payer: Medicare Other | Admitting: Physical Therapy

## 2022-01-21 ENCOUNTER — Encounter (HOSPITAL_COMMUNITY): Payer: Medicare Other | Admitting: Physical Therapy

## 2022-01-25 ENCOUNTER — Encounter (INDEPENDENT_AMBULATORY_CARE_PROVIDER_SITE_OTHER): Payer: Self-pay | Admitting: *Deleted

## 2022-01-26 ENCOUNTER — Encounter (HOSPITAL_COMMUNITY): Payer: Medicare Other

## 2022-01-27 ENCOUNTER — Ambulatory Visit: Payer: Medicare Other | Admitting: Internal Medicine

## 2022-01-28 ENCOUNTER — Other Ambulatory Visit: Payer: Self-pay

## 2022-01-28 ENCOUNTER — Encounter (HOSPITAL_COMMUNITY): Payer: Medicare Other | Admitting: Physical Therapy

## 2022-01-28 DIAGNOSIS — E1165 Type 2 diabetes mellitus with hyperglycemia: Secondary | ICD-10-CM

## 2022-01-28 MED ORDER — ONETOUCH ULTRA VI STRP: ORAL_STRIP | 2 refills | Status: DC

## 2022-02-01 DIAGNOSIS — I739 Peripheral vascular disease, unspecified: Secondary | ICD-10-CM | POA: Diagnosis not present

## 2022-02-04 ENCOUNTER — Ambulatory Visit: Payer: Medicare Other | Admitting: Urology

## 2022-02-04 VITALS — BP 122/79 | HR 111

## 2022-02-04 DIAGNOSIS — N5201 Erectile dysfunction due to arterial insufficiency: Secondary | ICD-10-CM | POA: Diagnosis not present

## 2022-02-04 DIAGNOSIS — R3915 Urgency of urination: Secondary | ICD-10-CM

## 2022-02-04 DIAGNOSIS — N138 Other obstructive and reflux uropathy: Secondary | ICD-10-CM

## 2022-02-04 DIAGNOSIS — N3941 Urge incontinence: Secondary | ICD-10-CM

## 2022-02-04 DIAGNOSIS — R351 Nocturia: Secondary | ICD-10-CM

## 2022-02-04 DIAGNOSIS — R339 Retention of urine, unspecified: Secondary | ICD-10-CM

## 2022-02-04 DIAGNOSIS — N401 Enlarged prostate with lower urinary tract symptoms: Secondary | ICD-10-CM | POA: Diagnosis not present

## 2022-02-04 DIAGNOSIS — R3912 Poor urinary stream: Secondary | ICD-10-CM

## 2022-02-04 DIAGNOSIS — R350 Frequency of micturition: Secondary | ICD-10-CM

## 2022-02-04 LAB — BLADDER SCAN AMB NON-IMAGING: Scan Result: 48

## 2022-02-04 MED ORDER — TADALAFIL 5 MG PO TABS: 5.0000 mg | ORAL_TABLET | Freq: Every day | ORAL | 11 refills | Status: DC | PRN

## 2022-02-04 NOTE — Progress Notes (Signed)
Subjective: 1. BPH with urinary obstruction   2. Erectile dysfunction due to arterial insufficiency   3. Weak urinary stream   4. Incomplete bladder emptying   5. Urgency of urination   6. Urinary frequency   7. Nocturia   8. Urge incontinence        Robert Walters is a 66 yo male who was last seen by Dr. Diona Fanti in 3/20.  He has a history of BPH with BOO and ED.  He is has been off of the alfuzosin for the last year.  He has been on sildenafil in the past.  His last PSA was stable at 0.6 in 8/22.  He has CKD3a with a Cr of 1.57 on 11/16/21.  He is a diabetic and is on Jardiance as well as several other meds.  He had a renal US in 8/20 that showed findings consistent with medical renal disease.    He returns today with progressive frequency and urgency with UUI over the past 7-8 months.  He has nocturia 4-5x.  He has a weak stream with intermittency and has a sensation of incomplete empty.   He has no neuropathy.   He has had a TIA.    IPSS     Row Name 02/04/22 1500         International Prostate Symptom Score   How often have you had the sensation of not emptying your bladder? About half the time     How often have you had to urinate less than every two hours? More than half the time     How often have you found you stopped and started again several times when you urinated? Not at All     How often have you found it difficult to postpone urination? About half the time     How often have you had a weak urinary stream? About half the time     How often have you had to strain to start urination? More than half the time     How many times did you typically get up at night to urinate? 4 Times     Total IPSS Score 21       Quality of Life due to urinary symptoms   If you were to spend the rest of your life with your urinary condition just the way it is now how would you feel about that? Mostly Disatisfied              ROS:  Review of Systems  Genitourinary:  Positive for frequency  and urgency.  All other systems reviewed and are negative.   No Known Allergies  Past Medical History:  Diagnosis Date   Acid reflux disease    Bipolar 1 disorder, mixed (HCC)    BPH (benign prostatic hypertrophy)    CVA (cerebral vascular accident) ([place]) 07/2015   Gastritis    H/O hiatal hernia    Hyperlipidemia    Hypertension    Normal nuclear stress test Dec 2011   Obesity    TIA (transient ischemic attack) 05/15/2012   "they think I've had one this am @ 0130" (05/15/2012)   Type II diabetes mellitus ([place])    Vertigo     Past Surgical History:  Procedure Laterality Date   BILIARY STENT PLACEMENT N/A 09/17/2016   Procedure: BILIARY STENT PLACEMENT;  Surgeon: [name], MD;  [facility];  Service: Endoscopy;  Laterality: N/A;   CARDIOVASCULAR STRESS TEST  2009   EF  63%   CHOLECYSTECTOMY N/A 08/25/2016   Procedure: LAPAROSCOPIC CHOLECYSTECTOMY;  Surgeon: [name], MD;  Location: [facility];  Service: General;  Laterality: N/A;   COLONOSCOPY     COLONOSCOPY WITH PROPOFOL N/A 02/03/2021   Procedure: COLONOSCOPY WITH PROPOFOL;  Surgeon: Harvel Quale, MD;  [facility];  Service: Gastroenterology;  Laterality: N/A;    incomplete colonoscopy poor prep   COLONOSCOPY WITH PROPOFOL N/A 02/04/2021   Procedure: COLONOSCOPY WITH PROPOFOL;  Surgeon: Harvel Quale, MD;  [facility];  Service: Gastroenterology;  Laterality: N/A;  1:45   ERCP N/A 09/17/2016   Procedure: ENDOSCOPIC RETROGRADE CHOLANGIOPANCREATOGRAPHY (ERCP);  Surgeon: [name], MD;  [facility];  Service: Endoscopy;  Laterality: N/A;  730   ERCP N/A 12/03/2016   Procedure: ENDOSCOPIC RETROGRADE CHOLANGIOPANCREATOGRAPHY (ERCP);  Surgeon: [name], MD;  [facility];  Service: Gastroenterology;  Laterality: N/A;   ESOPHAGEAL DILATION  02/03/2021   Procedure: ESOPHAGEAL DILATION;  Surgeon: Montez Morita, Quillian Quince, MD;  [facility]  [facility];  Service: Gastroenterology;;  savory 16 and 18 fr   ESOPHAGOGASTRODUODENOSCOPY     ESOPHAGOGASTRODUODENOSCOPY (EGD) WITH PROPOFOL N/A 02/03/2021   Procedure: ESOPHAGOGASTRODUODENOSCOPY (EGD) WITH PROPOFOL;  Surgeon: Harvel Quale, MD;  [facility];  Service: Gastroenterology;  Laterality: N/A;   GASTROINTESTINAL STENT REMOVAL N/A 12/03/2016   Procedure: BILIARY STENT REMOVAL;  Surgeon: [name], MD;  [facility];  Service: Gastroenterology;  Laterality: N/A;   NO PAST SURGERIES     POLYPECTOMY  02/04/2021   Procedure: POLYPECTOMY INTESTINAL;  Surgeon: Harvel Quale, MD;  [facility];  Service: Gastroenterology;;   SPHINCTEROTOMY N/A 09/17/2016   Procedure: Joan Mayans;  Surgeon: [name], MD;  [facility];  Service: Endoscopy;  Laterality: N/A;    Social History   Socioeconomic History   Marital status: Widowed    Spouse name: Not on file   Number of children: Not on file   Years of education: Not on file   Highest education level: Not on file  Occupational History   Occupation: disabled  Tobacco Use   Smoking status: Never   Smokeless tobacco: Never  Vaping Use   Vaping Use: Never used  Substance and Sexual Activity   Alcohol use: No   Drug use: No   Sexual activity: Not Currently  Other Topics Concern   Not on file  Social History Narrative   Not on file   Social Determinants of Health   Financial Resource Strain: Low Risk  (04/02/2021)   Overall Financial Resource Strain (CARDIA)    Difficulty of Paying Living Expenses: Not hard at all  Food Insecurity: No Food Insecurity (04/02/2021)   Hunger Vital Sign    Worried About Running Out of Food in the Last Year: Never true    [place] in the Last Year: Never true  Transportation Needs: No Transportation Needs (04/02/2021)   PRAPARE - Hydrologist (Medical): No    Lack of Transportation  (Non-Medical): No  Physical Activity: Inactive (04/02/2021)   Exercise Vital Sign    Days of Exercise per Week: 0 days    Minutes of Exercise per Session: 0 min  Stress: No Stress Concern Present (04/02/2021)   [place]    Feeling of Stress : Not at all  Social Connections: Not on file  Intimate Partner  Violence: Not At Risk (03/21/2019)   Humiliation, Afraid, Rape, and Kick questionnaire    Fear of Current or Ex-Partner: No    Emotionally Abused: No    Physically Abused: No    Sexually Abused: No    Family History  Problem Relation Age of Onset   Hyperlipidemia Father        in a nursing home   Colon cancer Father    Stroke Mother 76    Anti-infectives: Anti-infectives (From admission, onward)    None       Current Outpatient Medications  Medication Sig Dispense Refill   metFORMIN (GLUCOPHAGE) 1000 MG tablet TAKE 1 TABLET BY MOUTH TWICE DAILY WITH MEALS 180 tablet 1   tadalafil (CIALIS) 5 MG tablet Take 1 tablet (5 mg total) by mouth daily as needed for erectile dysfunction. 30 tablet 11   Ascorbic Acid (VITAMIN C) 500 MG CAPS Take 500 mg by mouth daily.      aspirin EC 81 MG tablet Take 1 tablet (81 mg total) by mouth daily.     atorvastatin (LIPITOR) 80 MG tablet TAKE 1 TABLET BY MOUTH DAILY 90 tablet 1   b complex vitamins tablet Take 1 tablet by mouth daily.      cholecalciferol (VITAMIN D3) 25 MCG (1000 UNIT) tablet Take 2,000 Units by mouth daily.     CINNAMON PO Take 2,000 mg by mouth daily.     Coenzyme Q10 (COQ10) 200 MG CAPS Take 200 mg by mouth daily.     empagliflozin (JARDIANCE) 25 MG TABS tablet Take 1 tablet (25 mg total) by mouth daily before breakfast. 30 tablet 2   ezetimibe (ZETIA) 10 MG tablet Take 1 tablet (10 mg total) by mouth daily. 90 tablet 3   FLUoxetine (PROZAC) 40 MG capsule TAKE ONE CAPSULE BY MOUTH EVERY MORNING 90 capsule 0   Garlic 676 MG CAPS Take 500 mg by mouth daily.      glucose blood (ONETOUCH ULTRA) test strip USE 1 STRIP TO CHECK GLUCOSE THREE TIMES DAILY 100 each 2   insulin degludec (TRESIBA FLEXTOUCH) 100 UNIT/ML FlexTouch Pen Inject 55 Units into the skin daily. 15 mL 1   Insulin Pen Needle (PEN NEEDLES) 32G X 4 MM MISC Use as directed 100 each 2   lamoTRIgine (LAMICTAL) 150 MG tablet Take 1 tablet (150 mg total) by mouth 2 (two) times daily. 180 tablet 1   LORazepam (ATIVAN) 1 MG tablet TAKE 1 TABLET BY MOUTH THREE TIMES DAILY AS NEEDED FOR ANXIETY. 270 tablet 0   Menthol-Methyl Salicylate (ARTHRITIS HOT) 10-15 % CREA Apply 1 application  topically daily as needed (pain).     Menthol-Methyl Salicylate (ICY HOT) 19-50 % STCK Apply 1 application topically 3 (three) times daily as needed (pain).     metoprolol tartrate (LOPRESSOR) 25 MG tablet TAKE 1 TABLET BY MOUTH 2 TIMES DAILY GENERIC EQUIVALENT FOR LOPRESSOR. 180 tablet 3   Multiple Vitamin (MULTIVITAMIN WITH MINERALS) TABS tablet Take 1 tablet by mouth daily.     Omega-3 Fatty Acids (FISH OIL) 1000 MG CAPS Take 2,000 mg by mouth daily.     omeprazole (PRILOSEC) 40 MG capsule Take 1 capsule (40 mg total) by mouth daily. 90 capsule 0   Semaglutide (RYBELSUS) 14 MG TABS Take 1 tablet (14 mg total) by mouth daily at 6 (six) AM. 90 tablet 0   valsartan (DIOVAN) 320 MG tablet Take 1 tablet (320 mg total) by mouth daily. 90 tablet 3   No current facility-administered  medications for this visit.     Objective: Vital signs in last 24 hours: BP 122/79   Pulse (!) 111   Intake/Output from previous day: No intake/output data recorded. Intake/Output this shift: @IOTHISSHIFT @   Physical Exam Vitals reviewed.  Constitutional:      Appearance: Normal appearance. He is obese.  Cardiovascular:     Rate and Rhythm: Regular rhythm. Tachycardia present.  Pulmonary:     Effort: Pulmonary effort is normal.     Breath sounds: Normal breath sounds.  Abdominal:     Palpations: Abdomen is soft.      Tenderness: There is no abdominal tenderness.     Hernia: No hernia is present.  Genitourinary:    Comments: Normal phallus with adequate meatus. Scrotum, testes and epididymis normal. AP without lesions. NST without mass. Prostate 1.5+ benign.  Musculoskeletal:        General: No swelling or tenderness. Normal range of motion.  Skin:    General: Skin is warm and dry.  Neurological:     General: No focal deficit present.     Mental Status: He is alert and oriented to person, place, and time.  Psychiatric:        Mood and Affect: Mood normal.        Behavior: Behavior normal.     Lab Results:  Results for orders placed or performed in visit on 02/04/22 (from the past 24 hour(s))  Urinalysis, Routine w reflex microscopic     Status: None   Collection Time: 02/04/22  3:08 PM  Result Value Ref Range   Specific Gravity, UA 1.020 1.005 - 1.030   pH, UA 6.5 5.0 - 7.5   Color, UA Yellow Yellow   Appearance Ur Clear Clear   Leukocytes,UA Negative Negative   Protein,UA Negative Negative/Trace   Glucose, UA Negative Negative   Ketones, UA Negative Negative   RBC, UA Negative Negative   Bilirubin, UA Negative Negative   Urobilinogen, Ur 0.2 0.2 - 1.0 mg/dL   Nitrite, UA Negative Negative   Narrative   Performed at:  [facility] [address], [place], [place]  [id number] Lab Director: Twain Harte, Phone:  [phone]    BMET No results for input(s): "NA", "K", "CL", "CO2", "GLUCOSE", "BUN", "CREATININE", "CALCIUM" in the last 72 hours. PT/INR No results for input(s): "LABPROT", "INR" in the last 72 hours. ABG No results for input(s): "PHART", "HCO3" in the last 72 hours.  Invalid input(s): "PCO2", "PO2"  Studies/Results: No results found.   Assessment/Plan: BPH with BOO and OAB.   I am going to try him on tadalafil for daily use since he also has ED and desires treatment.   I reviewed the side effects and instructions in detail.  HE will return for a  flowrate and possible cystoscopy.  ED.  Tadalafil daily.  Meds ordered this encounter  Medications   tadalafil (CIALIS) 5 MG tablet    Sig: Take 1 tablet (5 mg total) by mouth daily as needed for erectile dysfunction.    Dispense:  30 tablet    Refill:  11     Orders Placed This Encounter  Procedures   Urinalysis, Routine w reflex microscopic   Bladder Scan (Post Void Residual) in office     Return in about 6 weeks (around 03/18/2022) for flowrate and PVR.    CC: [name] FNP     Irine Seal 02/05/2022

## 2022-02-04 NOTE — Progress Notes (Signed)
3:25 PM post void residual =48

## 2022-02-05 ENCOUNTER — Encounter: Payer: Self-pay | Admitting: Urology

## 2022-02-10 ENCOUNTER — Other Ambulatory Visit: Payer: Self-pay | Admitting: Physician Assistant

## 2022-02-10 ENCOUNTER — Ambulatory Visit: Payer: Medicare Other | Attending: Internal Medicine | Admitting: Internal Medicine

## 2022-02-10 VITALS — BP 118/76 | HR 107 | Ht 69.0 in | Wt 239.0 lb

## 2022-02-10 DIAGNOSIS — E669 Obesity, unspecified: Secondary | ICD-10-CM

## 2022-02-10 DIAGNOSIS — E782 Mixed hyperlipidemia: Secondary | ICD-10-CM

## 2022-02-10 DIAGNOSIS — E1169 Type 2 diabetes mellitus with other specified complication: Secondary | ICD-10-CM | POA: Diagnosis not present

## 2022-02-10 NOTE — Progress Notes (Signed)
LIPID CLINIC CONSULT NOTE  Chief Complaint:  Follow-up dyslipidemia  Primary Care Physician: [name], FNP  Primary Cardiologist:  Mertie Moores, MD  HPI:  Robert Walters is a 66 y.o. male who is being seen today for the evaluation of dyslipidemia at the request of [name], [place]. 66 y.o. male who presents via audio/video conferencing for a telehealth visit today.  This is a pleasant 66 year old male patient of [name] with a history of hypertension, dyslipidemia, chronic diastolic heart failure and type 2 diabetes with gastroparesis.  More recently has had significant rise in A1c which peaked over 10 demonstrating morning blood sugars of 400.  He was started on long-acting insulin in July and has noted significant improvement in his numbers.  His triglycerides have tracked this as well rising from 371 a year ago up to 511 4 months ago.  LDL has remained fairly stable at 110 however he is on 2 mg daily.  He was also recommended to use over-the-counter fish oil.  09/15/2020  Robert Walters returns today in the office for follow-up of dyslipidemia.  He had repeat lipids in February which showed total cholesterol 191, triglycerides 375, HDL 35 and LDL 94.  This does represent somewhat of a reduction in his lipids although not dramatic.  The triglycerides are probably the lowest we have seen in a while.  A lot of this may have been related to his hemoglobin A1c which was over 10 at one point but is now down to 6.9 as of February.  Current treatment strategy includes atorvastatin 40 mg daily, and 1 g of fish oil daily.  04/01/2021  Robert Walters returns today for follow-up.  His cholesterol in the interim is gone up slightly.  Total cholesterol 222, triglycerides 376, HDL 41 and LDL 120, up from 91.  He reports compliance with his medicine.  He may have had some dietary changes.  He is working on his blood sugar.  He is on a good medical regimen.  Recently his primary care provider increased  his atorvastatin from 40 to 80 mg.  I am not sure that is enough to get him to target.  I like to see his LDL down below 70.  07/23/2021  Robert Walters is seen today in follow-up.  His cholesterol continues to improve.  Total now 116, triglycerides 193 (the lowest its been in some years), HDL 34 and LDL 43.  He is on ezetimibe in addition to atorvastatin.  A1c has trended up slightly to 7.2 but he is on Jardiance, Rybelsus and [place].  02/10/2022  Robert Walters returns today for follow-up.  He had recent repeat lipids in July which showed total cholesterol 103, triglycerides 258, HDL 31 and LDL 33.  Although his triglycerides have been up and down, generally his lipids have been well controlled.  Hemoglobin A1c is lower at 6.9%.  He did see [name] and was not having any acute cardiac issues.  PMHx:  Past Medical History:  Diagnosis Date   Acid reflux disease    Bipolar 1 disorder, mixed (HCC)    BPH (benign prostatic hypertrophy)    CVA (cerebral vascular accident) ([place]) 07/2015   Gastritis    H/O hiatal hernia    Hyperlipidemia    Hypertension    Normal nuclear stress test Dec 2011   Obesity    TIA (transient ischemic attack) 05/15/2012   "they think I've had one this am @ 0130" (05/15/2012)   Type II diabetes mellitus ([place])  Vertigo     Past Surgical History:  Procedure Laterality Date   BILIARY STENT PLACEMENT N/A 09/17/2016   Procedure: BILIARY STENT PLACEMENT;  Surgeon: [name], MD;  [facility];  Service: Endoscopy;  Laterality: N/A;   CARDIOVASCULAR STRESS TEST  2009   EF 63%   CHOLECYSTECTOMY N/A 08/25/2016   Procedure: LAPAROSCOPIC CHOLECYSTECTOMY;  Surgeon: [name], MD;  Location: [facility];  Service: General;  Laterality: N/A;   COLONOSCOPY     COLONOSCOPY WITH PROPOFOL N/A 02/03/2021   Procedure: COLONOSCOPY WITH PROPOFOL;  Surgeon: Harvel Quale, MD;  [facility];  Service: Gastroenterology;  Laterality: N/A;    incomplete  colonoscopy poor prep   COLONOSCOPY WITH PROPOFOL N/A 02/04/2021   Procedure: COLONOSCOPY WITH PROPOFOL;  Surgeon: Harvel Quale, MD;  [facility];  Service: Gastroenterology;  Laterality: N/A;  1:45   ERCP N/A 09/17/2016   Procedure: ENDOSCOPIC RETROGRADE CHOLANGIOPANCREATOGRAPHY (ERCP);  Surgeon: [name], MD;  [facility];  Service: Endoscopy;  Laterality: N/A;  730   ERCP N/A 12/03/2016   Procedure: ENDOSCOPIC RETROGRADE CHOLANGIOPANCREATOGRAPHY (ERCP);  Surgeon: [name], MD;  [facility];  Service: Gastroenterology;  Laterality: N/A;   ESOPHAGEAL DILATION  02/03/2021   Procedure: ESOPHAGEAL DILATION;  Surgeon: Montez Morita, Quillian Quince, MD;  [facility];  Service: Gastroenterology;;  savory 16 and 18 fr   ESOPHAGOGASTRODUODENOSCOPY     ESOPHAGOGASTRODUODENOSCOPY (EGD) WITH PROPOFOL N/A 02/03/2021   Procedure: ESOPHAGOGASTRODUODENOSCOPY (EGD) WITH PROPOFOL;  Surgeon: Harvel Quale, MD;  [facility];  Service: Gastroenterology;  Laterality: N/A;   GASTROINTESTINAL STENT REMOVAL N/A 12/03/2016   Procedure: BILIARY STENT REMOVAL;  Surgeon: [name], MD;  [facility];  Service: Gastroenterology;  Laterality: N/A;   NO PAST SURGERIES     POLYPECTOMY  02/04/2021   Procedure: POLYPECTOMY INTESTINAL;  Surgeon: Harvel Quale, MD;  [facility];  Service: Gastroenterology;;   SPHINCTEROTOMY N/A 09/17/2016   Procedure: Joan Mayans;  Surgeon: [name], MD;  [facility];  Service: Endoscopy;  Laterality: N/A;    FAMHx:  Family History  Problem Relation Age of Onset   Hyperlipidemia Father        in a nursing home   Colon cancer Father    Stroke Mother 66    SOCHx:   reports that he has never smoked. He has never used smokeless tobacco. He reports that he does not drink alcohol and does not use drugs.  ALLERGIES:  No Known  Allergies  ROS: Pertinent items noted in HPI and remainder of comprehensive ROS otherwise negative.  HOME MEDS: Current Outpatient Medications on File Prior to Visit  Medication Sig Dispense Refill   aspirin EC 81 MG tablet Take 1 tablet (81 mg total) by mouth daily.     atorvastatin (LIPITOR) 80 MG tablet TAKE 1 TABLET BY MOUTH DAILY 90 tablet 1   b complex vitamins tablet Take 1 tablet by mouth daily.      cholecalciferol (VITAMIN D3) 25 MCG (1000 UNIT) tablet Take 2,000 Units by mouth daily.     CINNAMON PO Take 2,000 mg by mouth daily.     Coenzyme Q10 (COQ10) 200 MG CAPS Take 200 mg by mouth daily.     empagliflozin (JARDIANCE) 25 MG TABS tablet Take 1 tablet (25 mg total) by mouth daily before breakfast. 30 tablet 2   ezetimibe (ZETIA) 10 MG tablet Take  1 tablet (10 mg total) by mouth daily. 90 tablet 3   FLUoxetine (PROZAC) 40 MG capsule TAKE ONE CAPSULE BY MOUTH EVERY MORNING 90 capsule 0   Garlic 627 MG CAPS Take 500 mg by mouth daily.     glucose blood (ONETOUCH ULTRA) test strip USE 1 STRIP TO CHECK GLUCOSE THREE TIMES DAILY 100 each 2   insulin degludec (TRESIBA FLEXTOUCH) 100 UNIT/ML FlexTouch Pen Inject 55 Units into the skin daily. 15 mL 1   Insulin Pen Needle (PEN NEEDLES) 32G X 4 MM MISC Use as directed 100 each 2   lamoTRIgine (LAMICTAL) 150 MG tablet Take 1 tablet (150 mg total) by mouth 2 (two) times daily. 180 tablet 1   LORazepam (ATIVAN) 1 MG tablet TAKE 1 TABLET BY MOUTH THREE TIMES DAILY AS NEEDED FOR ANXIETY. 270 tablet 0   Menthol-Methyl Salicylate (ARTHRITIS HOT) 10-15 % CREA Apply 1 application  topically daily as needed (pain).     Menthol-Methyl Salicylate (ICY HOT) 03-50 % STCK Apply 1 application topically 3 (three) times daily as needed (pain).     metFORMIN (GLUCOPHAGE) 1000 MG tablet TAKE 1 TABLET BY MOUTH TWICE DAILY WITH MEALS 180 tablet 1   metoprolol tartrate (LOPRESSOR) 25 MG tablet TAKE 1 TABLET BY MOUTH 2 TIMES DAILY GENERIC EQUIVALENT FOR  LOPRESSOR. 180 tablet 3   Multiple Vitamin (MULTIVITAMIN WITH MINERALS) TABS tablet Take 1 tablet by mouth daily.     Omega-3 Fatty Acids (FISH OIL) 1000 MG CAPS Take 2,000 mg by mouth daily.     omeprazole (PRILOSEC) 40 MG capsule Take 1 capsule (40 mg total) by mouth daily. 90 capsule 0   Semaglutide (RYBELSUS) 14 MG TABS Take 1 tablet (14 mg total) by mouth daily at 6 (six) AM. 90 tablet 0   tadalafil (CIALIS) 5 MG tablet Take 1 tablet (5 mg total) by mouth daily as needed for erectile dysfunction. 30 tablet 11   valsartan (DIOVAN) 320 MG tablet Take 1 tablet (320 mg total) by mouth daily. 90 tablet 3   amoxicillin (AMOXIL) 500 MG capsule Take 500 mg by mouth 3 (three) times daily.     Ascorbic Acid (VITAMIN C) 500 MG CAPS Take 500 mg by mouth daily.      No current facility-administered medications on file prior to visit.    LABS/IMAGING: No results found for this or any previous visit (from the past 48 hour(s)). No results found.  LIPID PANEL:    Component Value Date/Time   CHOL 103 11/16/2021 1145   TRIG 258 (H) 11/16/2021 1145   HDL 31 (L) 11/16/2021 1145   CHOLHDL 3.3 11/16/2021 1145   CHOLHDL 3.4 07/17/2021 1047   VLDL 39 07/17/2021 1047   LDLCALC 33 11/16/2021 1145   LDLDIRECT 124.3 11/10/2010 0953    WEIGHTS: Wt Readings from Last 3 Encounters:  02/10/22 239 lb (108.4 kg)  11/16/21 229 lb 9.6 oz (104.1 kg)  08/18/21 234 lb 12.8 oz (106.5 kg)    VITALS: BP 118/76   Pulse (!) 107   Ht 5' 9"  (1.753 m)   Wt 239 lb (108.4 kg)   SpO2 95%   BMI 35.29 kg/m   EXAM: Deferred  EKG: deferred  ASSESSMENT: Hypertriglyceridemia Uncontrolled DM2 Chronic diastolic CHF Obesity  PLAN: 1.   Robert Walters is much better control of his triglycerides which are well over 500 at 1 point.  He is currently on 2 g of fish oil daily in addition to ezetimibe and atorvastatin.  I would  continue this combination.  He seems to be doing well.  His diabetes is also better controlled.   I am happy to see him back for his lipids annually or sooner as necessary.  Pixie Casino, MD, [facility], [place] Director of the [facility]  Card[facility]ovascular [facility] Diplomate of the American Board of Clinical Lipidology Attending Cardiologist  Direct Dial: [phone]  Fax: [phone]  Website:  [email or web address] 02/10/2022, 1:27 PM

## 2022-02-10 NOTE — Patient Instructions (Signed)
Medication Instructions:  Your physician recommends that you continue on your current medications as directed. Please refer to the Current Medication list given to you today.   Labwork: None  Testing/Procedures: None  Follow-Up: Follow up in 1 year with Dr. Debara Pickett.   Any Other Special Instructions Will Be Listed Below (If Applicable).     If you need a refill on your cardiac medications before your next appointment, please call your pharmacy.

## 2022-02-10 NOTE — Progress Notes (Signed)
Order(s) created erroneously. Erroneous order ID: [id number]  Order canceled by: CHART CORRECTION ANALYST TWELVE, IDENTITY  Order cancel date/time: 02/10/2022 7:55 AM

## 2022-02-11 ENCOUNTER — Other Ambulatory Visit: Payer: Self-pay | Admitting: Physician Assistant

## 2022-02-11 NOTE — Telephone Encounter (Signed)
Filled 7/27

## 2022-02-12 ENCOUNTER — Other Ambulatory Visit (HOSPITAL_BASED_OUTPATIENT_CLINIC_OR_DEPARTMENT_OTHER): Payer: Self-pay | Admitting: Internal Medicine

## 2022-02-23 ENCOUNTER — Telehealth: Payer: Self-pay

## 2022-02-23 NOTE — Telephone Encounter (Signed)
Patient called because he has one box left of [place]. HC to follow up with patient assistance program to confirm delivery time.  [name], CPP, PharmD Clinical Pharmacist Practitioner [facility] [phone]

## 2022-02-25 ENCOUNTER — Ambulatory Visit (INDEPENDENT_AMBULATORY_CARE_PROVIDER_SITE_OTHER): Payer: Medicare Other | Admitting: Gastroenterology

## 2022-02-26 ENCOUNTER — Telehealth: Payer: Self-pay

## 2022-02-26 NOTE — Chronic Care Management (AMB) (Addendum)
    Chronic Care Management Pharmacy Assistant   Name: Robert Walters  MRN: [id number] DOB: [birth year]  Reason for Encounter: [name] PAP   02-26-2022: Contacted [employer] and was told patient is in need of a reorder form. Form completed and uploaded to mail. Patient was informed.Patient has 1 box left.   Medications: Outpatient Encounter Medications as of 02/26/2022  Medication Sig   amoxicillin (AMOXIL) 500 MG capsule Take 500 mg by mouth 3 (three) times daily.   Ascorbic Acid (VITAMIN C) 500 MG CAPS Take 500 mg by mouth daily.    aspirin EC 81 MG tablet Take 1 tablet (81 mg total) by mouth daily.   atorvastatin (LIPITOR) 80 MG tablet TAKE 1 TABLET BY MOUTH DAILY   b complex vitamins tablet Take 1 tablet by mouth daily.    cholecalciferol (VITAMIN D3) 25 MCG (1000 UNIT) tablet Take 2,000 Units by mouth daily.   CINNAMON PO Take 2,000 mg by mouth daily.   Coenzyme Q10 (COQ10) 200 MG CAPS Take 200 mg by mouth daily.   empagliflozin (JARDIANCE) 25 MG TABS tablet Take 1 tablet (25 mg total) by mouth daily before breakfast.   ezetimibe (ZETIA) 10 MG tablet TAKE 1 TABLET BY MOUTH DAILY GENERIC EQUIVALENT FOR ZETIA   FLUoxetine (PROZAC) 40 MG capsule TAKE ONE CAPSULE BY MOUTH EVERY MORNING   Garlic 542 MG CAPS Take 500 mg by mouth daily.   glucose blood (ONETOUCH ULTRA) test strip USE 1 STRIP TO CHECK GLUCOSE THREE TIMES DAILY   insulin degludec (TRESIBA FLEXTOUCH) 100 UNIT/ML FlexTouch Pen Inject 55 Units into the skin daily.   Insulin Pen Needle (PEN NEEDLES) 32G X 4 MM MISC Use as directed   lamoTRIgine (LAMICTAL) 150 MG tablet Take 1 tablet (150 mg total) by mouth 2 (two) times daily.   LORazepam (ATIVAN) 1 MG tablet TAKE 1 TABLET BY MOUTH THREE TIMES DAILY AS NEEDED FOR ANXIETY.   Menthol-Methyl Salicylate (ARTHRITIS HOT) 10-15 % CREA Apply 1 application  topically daily as needed (pain).   Menthol-Methyl Salicylate (ICY HOT) 70-62 % STCK Apply 1 application topically 3 (three) times daily  as needed (pain).   metFORMIN (GLUCOPHAGE) 1000 MG tablet TAKE 1 TABLET BY MOUTH TWICE DAILY WITH MEALS   metoprolol tartrate (LOPRESSOR) 25 MG tablet TAKE 1 TABLET BY MOUTH 2 TIMES DAILY GENERIC EQUIVALENT FOR LOPRESSOR.   Multiple Vitamin (MULTIVITAMIN WITH MINERALS) TABS tablet Take 1 tablet by mouth daily.   Omega-3 Fatty Acids (FISH OIL) 1000 MG CAPS Take 2,000 mg by mouth daily.   omeprazole (PRILOSEC) 40 MG capsule Take 1 capsule (40 mg total) by mouth daily.   Semaglutide (RYBELSUS) 14 MG TABS Take 1 tablet (14 mg total) by mouth daily at 6 (six) AM.   tadalafil (CIALIS) 5 MG tablet Take 1 tablet (5 mg total) by mouth daily as needed for erectile dysfunction.   valsartan (DIOVAN) 320 MG tablet Take 1 tablet (320 mg total) by mouth daily.   No facility-administered encounter medications on file as of 02/26/2022.    [facility] Pharmacist Assistant [phone]

## 2022-03-05 ENCOUNTER — Other Ambulatory Visit (INDEPENDENT_AMBULATORY_CARE_PROVIDER_SITE_OTHER): Payer: Self-pay | Admitting: Gastroenterology

## 2022-03-05 DIAGNOSIS — K219 Gastro-esophageal reflux disease without esophagitis: Secondary | ICD-10-CM

## 2022-03-08 NOTE — Telephone Encounter (Signed)
05/26/2022 next appointment.

## 2022-03-18 ENCOUNTER — Telehealth: Payer: Self-pay

## 2022-03-18 NOTE — Chronic Care Management (AMB) (Signed)
Faxed 2024 re-enrollment application to [place] patient assistance program for Jardiance.    Pattricia Boss, [place] Pharmacist Assistant [phone]

## 2022-03-23 ENCOUNTER — Telehealth: Payer: Self-pay

## 2022-03-23 NOTE — Telephone Encounter (Signed)
  Care Management   Follow Up Note   03/31/2022 Name: Robert Walters MRN: [id number] DOB: 1955-07-22   Referred by: [name], FNP Reason for referral : Chronic Care Management (Patient assistance follow up -[place] )   PCP office received notice of patients application missing certain parts prescription. [facility] pharmacy number [phone] and spoke with pharmacy liaison. Accepted a verbal prescription from  myself over the phone for current medication, Jardiance 25 mg tablet once per day with 4 refills. Of note, patients Jardiance was refilled on 02/20/2022 for a 90 day supply. New prescription sent is for the 2024 year. Pharmacist repeated back directions.   Follow Up Plan: The patient has been provided with contact information for the care management team and has been advised to call with any health related questions or concerns.   [name], CPP, PharmD Clinical Pharmacist Practitioner [facility] [phone]

## 2022-03-29 ENCOUNTER — Other Ambulatory Visit: Payer: Self-pay

## 2022-03-29 ENCOUNTER — Telehealth: Payer: Self-pay

## 2022-03-29 MED ORDER — LORAZEPAM 1 MG PO TABS: ORAL_TABLET | ORAL | 0 refills | Status: DC

## 2022-03-29 MED ORDER — LAMOTRIGINE 150 MG PO TABS: 150.0000 mg | ORAL_TABLET | Freq: Two times a day (BID) | ORAL | 1 refills | Status: DC

## 2022-03-30 NOTE — Progress Notes (Signed)
03-29-2022: Contacted [employer] to follow up on tresiba. Reorder form received 03-08-2022 but processed on 03-29-2022. Medication will arrive 10-14 business days. Patient will drop off 2024 re enrollment forms for tresiba and rybelsus. Sent message to Pattricia Boss to check for samples when she goes to [facility] today.  [place] Pharmacist Assistant [phone]

## 2022-03-31 ENCOUNTER — Telehealth: Payer: Self-pay

## 2022-03-31 DIAGNOSIS — M546 Pain in thoracic spine: Secondary | ICD-10-CM | POA: Diagnosis not present

## 2022-03-31 DIAGNOSIS — M9901 Segmental and somatic dysfunction of cervical region: Secondary | ICD-10-CM | POA: Diagnosis not present

## 2022-03-31 DIAGNOSIS — M9902 Segmental and somatic dysfunction of thoracic region: Secondary | ICD-10-CM | POA: Diagnosis not present

## 2022-03-31 DIAGNOSIS — M542 Cervicalgia: Secondary | ICD-10-CM | POA: Diagnosis not present

## 2022-03-31 NOTE — Telephone Encounter (Signed)
  Care Management   Follow Up Note   03/31/2022 Name: Robert Walters MRN: [id number] DOB: [birth year]   Referred by: [name], FNP Reason for referral : No chief complaint on file.   Collaborated with PCP team, patient to receive 1 sample box of [place]. Per PCP patient needs to have an OV since last seen in July, patient to have appointment next week Monday at 9:20 AM. Unable to reach patient, HC to call.   Follow Up Plan: The patient has been provided with contact information for the care management team and has been advised to call with any health related questions or concerns.   [name], CPP, PharmD Clinical Pharmacist Practitioner [facility] [phone]

## 2022-04-01 ENCOUNTER — Encounter: Payer: Self-pay | Admitting: Urology

## 2022-04-01 ENCOUNTER — Ambulatory Visit (INDEPENDENT_AMBULATORY_CARE_PROVIDER_SITE_OTHER): Payer: Medicare Other | Admitting: Urology

## 2022-04-01 VITALS — BP 136/84 | HR 111

## 2022-04-01 DIAGNOSIS — R350 Frequency of micturition: Secondary | ICD-10-CM

## 2022-04-01 DIAGNOSIS — R3912 Poor urinary stream: Secondary | ICD-10-CM

## 2022-04-01 DIAGNOSIS — R351 Nocturia: Secondary | ICD-10-CM

## 2022-04-01 DIAGNOSIS — N3941 Urge incontinence: Secondary | ICD-10-CM

## 2022-04-01 DIAGNOSIS — N138 Other obstructive and reflux uropathy: Secondary | ICD-10-CM | POA: Diagnosis not present

## 2022-04-01 DIAGNOSIS — N401 Enlarged prostate with lower urinary tract symptoms: Secondary | ICD-10-CM

## 2022-04-01 DIAGNOSIS — N5201 Erectile dysfunction due to arterial insufficiency: Secondary | ICD-10-CM

## 2022-04-01 DIAGNOSIS — R3915 Urgency of urination: Secondary | ICD-10-CM

## 2022-04-01 LAB — BLADDER SCAN AMB NON-IMAGING: Scan Result: 31

## 2022-04-01 MED ORDER — VIBEGRON 75 MG PO TABS: 75.0000 mg | ORAL_TABLET | Freq: Every day | ORAL | 0 refills | Status: DC

## 2022-04-01 MED ORDER — TADALAFIL 5 MG PO TABS: 5.0000 mg | ORAL_TABLET | Freq: Every day | ORAL | 11 refills | Status: DC | PRN

## 2022-04-01 MED ORDER — CIPROFLOXACIN HCL 500 MG PO TABS: 500.0000 mg | ORAL_TABLET | Freq: Once | ORAL | Status: DC

## 2022-04-01 NOTE — Chronic Care Management (AMB) (Signed)
04-01-2022: Patient was aware that sample of tresiba is available at office. Patient will pick sample at appointment on 04-05-2022.  [place] Pharmacist Assistant [phone]

## 2022-04-01 NOTE — Progress Notes (Signed)
Subjective: 1. BPH with urinary obstruction   2. Urgency of urination   3. Urinary frequency   4. Nocturia   5. Urge incontinence   6. Weak urinary stream   7. Erectile dysfunction due to arterial insufficiency      04/01/22: Robert Walters returns today in f/u for voiding studies to assess his response to tadalafil for daily use.  He didn't have much response to the med for the erections or the LUTS.  His IPSS is 21 with nocturia x 4 and a weak stream.  He still has some UUI.  His PVR today was 62m but he had inadequate urine for a flowrate.  He had a CT AP in 2018 and the prostate at that time measured 243m   A repeat FR after cysto had a sawtooth flow curve with a PF of 1458mec  02/04/22: Robert Walters a [age] male who was last seen by Dr. DahDiona Fanti 3/20.  He has a history of BPH with BOO and ED.  He is has been off of the alfuzosin for the last year.  He has been on sildenafil in the past.  His last PSA was stable at 0.6 in 8/22.  He has CKD3a with a Cr of 1.57 on 11/16/21.  He is a diabetic and is on Jardiance as well as several other meds.  He had a renal [place] 8/20 that showed findings consistent with medical renal disease.    He returns today with progressive frequency and urgency with UUI over the past 7-8 months.  He has nocturia 4-5x.  He has a weak stream with intermittency and has a sensation of incomplete empty.   He has no neuropathy.   He has had a TIA.    IPSS     Row Name 04/01/22 1400         International Prostate Symptom Score   How often have you had the sensation of not emptying your bladder? Less than half the time     How often have you had to urinate less than every two hours? About half the time     How often have you found you stopped and started again several times when you urinated? Less than half the time     How often have you found it difficult to postpone urination? About half the time     How often have you had a weak urinary stream? More than half the time      How often have you had to strain to start urination? Less than 1 in 5 times     How many times did you typically get up at night to urinate? 4 Times     Total IPSS Score 19       Quality of Life due to urinary symptoms   If you were to spend the rest of your life with your urinary condition just the way it is now how would you feel about that? Mostly Disatisfied               ROS:  Review of Systems  Genitourinary:  Positive for frequency and urgency.  All other systems reviewed and are negative.   No Known Allergies  Past Medical History:  Diagnosis Date   Acid reflux disease    Bipolar 1 disorder, mixed (HCC)    BPH (benign prostatic hypertrophy)    CV[facility] ([facility]re[facility] vascular [facility]) [facility]   Gastritis    H/O hiatal hernia    Hyperlipidemia  Hypertension    Normal nuclear stress test Dec 2011   Obesity    TIA (transient ischemic attack) 05/15/2012   "they think I've had one this am @ 0130" (05/15/2012)   Type II diabetes mellitus ([place])    Vertigo     Past Surgical History:  Procedure Laterality Date   BILIARY STENT PLACEMENT N/A 09/17/2016   Procedure: BILIARY STENT PLACEMENT;  Surgeon: [name], MD;  [facility];  Service: Endoscopy;  Laterality: N/A;   CARDIOVASCULAR STRESS TEST  2009   EF 63%   CHOLECYSTECTOMY N/A 08/25/2016   Procedure: LAPAROSCOPIC CHOLECYSTECTOMY;  Surgeon: [name], MD;  Location: [facility];  Service: General;  Laterality: N/A;   COLONOSCOPY     COLONOSCOPY WITH PROPOFOL N/A 02/03/2021   Procedure: COLONOSCOPY WITH PROPOFOL;  Surgeon: Harvel Quale, MD;  [facility];  Service: Gastroenterology;  Laterality: N/A;    incomplete colonoscopy poor prep   COLONOSCOPY WITH PROPOFOL N/A 02/04/2021   Procedure: COLONOSCOPY WITH PROPOFOL;  Surgeon: Harvel Quale, MD;  [facility];  Service: Gastroenterology;  Laterality: N/A;  1:45   ERCP N/A 09/17/2016   Procedure: ENDOSCOPIC  RETROGRADE CHOLANGIOPANCREATOGRAPHY (ERCP);  Surgeon: [name], MD;  [facility];  Service: Endoscopy;  Laterality: N/A;  730   ERCP N/A 12/03/2016   Procedure: ENDOSCOPIC RETROGRADE CHOLANGIOPANCREATOGRAPHY (ERCP);  Surgeon: [name], MD;  [facility];  Service: Gastroenterology;  Laterality: N/A;   ESOPHAGEAL DILATION  02/03/2021   Procedure: ESOPHAGEAL DILATION;  Surgeon: Montez Morita, Quillian Quince, MD;  [facility];  Service: Gastroenterology;;  savory 16 and 18 fr   ESOPHAGOGASTRODUODENOSCOPY     ESOPHAGOGASTRODUODENOSCOPY (EGD) WITH PROPOFOL N/A 02/03/2021   Procedure: ESOPHAGOGASTRODUODENOSCOPY (EGD) WITH PROPOFOL;  Surgeon: Harvel Quale, MD;  [facility];  Service: Gastroenterology;  Laterality: N/A;   GASTROINTESTINAL STENT REMOVAL N/A 12/03/2016   Procedure: BILIARY STENT REMOVAL;  Surgeon: [name], MD;  [facility];  Service: Gastroenterology;  Laterality: N/A;   NO PAST SURGERIES     POLYPECTOMY  02/04/2021   Procedure: POLYPECTOMY INTESTINAL;  Surgeon: Harvel Quale, MD;  [facility];  Service: Gastroenterology;;   SPHINCTEROTOMY N/A 09/17/2016   Procedure: Joan Mayans;  Surgeon: [name], MD;  [facility];  Service: Endoscopy;  Laterality: N/A;    Social History   Socioeconomic History   Marital status: Widowed    Spouse name: Not on file   Number of children: Not on file   Years of education: Not on file   Highest education level: Not on file  Occupational History   Occupation: disabled  Tobacco Use   Smoking status: Never   Smokeless tobacco: Never  Vaping Use   Vaping Use: Never used  Substance and Sexual Activity   Alcohol use: No   Drug use: No   Sexual activity: Not Currently  Other Topics Concern   Not on file  Social History Narrative   Not on file   Social Determinants of Health   Financial Resource Strain: Low Risk   (04/02/2021)   Overall Financial Resource Strain (CARDIA)    Difficulty of Paying Living Expenses: Not hard at all  Food Insecurity: No Food Insecurity (04/02/2021)   Hunger Vital Sign    Worried About Running Out of Food in the Last Year: Never true    Ran Out of Food in the Last Year: Never true  Transportation Needs: No Transportation Needs (04/02/2021)   PRAPARE - Hydrologist (Medical): No    Lack of Transportation (Non-Medical): No  Physical Activity: Inactive (04/02/2021)   Exercise Vital Sign    Days of Exercise per Week: 0 days    Minutes of Exercise per Session: 0 min  Stress: No Stress Concern Present (04/02/2021)   [place]    Feeling of Stress : Not at all  Social Connections: Not on file  Intimate Partner Violence: Not At Risk (03/21/2019)   Humiliation, Afraid, Rape, and Kick questionnaire    Fear of Current or Ex-Partner: No    Emotionally Abused: No    Physically Abused: No    Sexually Abused: No    Family History  Problem Relation Age of Onset   Hyperlipidemia Father        in a nursing home   Colon cancer Father    Stroke Mother 48    Anti-infectives: Anti-infectives (From admission, onward)    Start     Dose/Rate Route Frequency Ordered Stop   04/01/22 1515  CIPROFLOXACIN HCL 500 MG PO TABS        500 mg Oral  Once 04/01/22 1500         Current Outpatient Medications  Medication Sig Dispense Refill   Ascorbic Acid (VITAMIN C) 500 MG CAPS Take 500 mg by mouth daily.      aspirin EC 81 MG tablet Take 1 tablet (81 mg total) by mouth daily.     atorvastatin (LIPITOR) 80 MG tablet TAKE 1 TABLET BY MOUTH DAILY 90 tablet 1   b complex vitamins tablet Take 1 tablet by mouth daily.      cholecalciferol (VITAMIN D3) 25 MCG (1000 UNIT) tablet Take 2,000 Units by mouth daily.     CINNAMON PO Take 2,000 mg by mouth daily.     Coenzyme Q10 (COQ10) 200 MG CAPS Take 200  mg by mouth daily.     empagliflozin (JARDIANCE) 25 MG TABS tablet Take 1 tablet (25 mg total) by mouth daily before breakfast. 30 tablet 2   ezetimibe (ZETIA) 10 MG tablet TAKE 1 TABLET BY MOUTH DAILY GENERIC EQUIVALENT FOR ZETIA 90 tablet 3   FLUoxetine (PROZAC) 40 MG capsule TAKE ONE CAPSULE BY MOUTH EVERY MORNING 90 capsule 0   Garlic 468 MG CAPS Take 500 mg by mouth daily.     glucose blood (ONETOUCH ULTRA) test strip USE 1 STRIP TO CHECK GLUCOSE THREE TIMES DAILY 100 each 2   insulin degludec (TRESIBA FLEXTOUCH) 100 UNIT/ML FlexTouch Pen Inject 55 Units into the skin daily. 15 mL 1   Insulin Pen Needle (PEN NEEDLES) 32G X 4 MM MISC Use as directed 100 each 2   lamoTRIgine (LAMICTAL) 150 MG tablet Take 1 tablet (150 mg total) by mouth 2 (two) times daily. 180 tablet 1   LORazepam (ATIVAN) 1 MG tablet TAKE 1 TABLET BY MOUTH THREE TIMES DAILY AS NEEDED FOR ANXIETY. 270 tablet 0   Menthol-Methyl Salicylate (ARTHRITIS HOT) 10-15 % CREA Apply 1 application  topically daily as needed (pain).     Menthol-Methyl Salicylate (ICY HOT) 03-21 % STCK Apply 1 application topically 3 (three) times daily as needed (pain).     metFORMIN (GLUCOPHAGE) 1000 MG tablet TAKE 1 TABLET BY MOUTH TWICE DAILY WITH MEALS 180 tablet 1   metoprolol tartrate (LOPRESSOR) 25 MG tablet TAKE 1 TABLET BY MOUTH 2 TIMES DAILY GENERIC  EQUIVALENT FOR LOPRESSOR. 180 tablet 3   Multiple Vitamin (MULTIVITAMIN WITH MINERALS) TABS tablet Take 1 tablet by mouth daily.     Omega-3 Fatty Acids (FISH OIL) 1000 MG CAPS Take 2,000 mg by mouth daily.     omeprazole (PRILOSEC) 40 MG capsule TAKE ONE CAPSULE BY MOUTH ONCE DAILY. 90 capsule 0   Semaglutide (RYBELSUS) 14 MG TABS Take 1 tablet (14 mg total) by mouth daily at 6 (six) AM. 90 tablet 0   valsartan (DIOVAN) 320 MG tablet Take 1 tablet (320 mg total) by mouth daily. 90 tablet 3   Vibegron 75 MG TABS Take 75 mg by mouth daily. 42 tablet 0   tadalafil (CIALIS) 5 MG tablet Take 1 tablet (5  mg total) by mouth daily as needed for erectile dysfunction. 30 tablet 11   Current Facility-Administered Medications  Medication Dose Route Frequency Provider Last Rate Last Admin   ciprofloxacin (CIPRO) tablet 500 mg  500 mg Oral Once Irine Seal, MD         Objective: Vital signs in last 24 hours: BP 136/84   Pulse (!) 111   Intake/Output from previous day: No intake/output data recorded. Intake/Output this shift: @IOTHISSHIFT @   Physical Exam  Lab Results:  Results for orders placed or performed in visit on 04/01/22 (from the past 24 hour(s))  Urinalysis, Routine w reflex microscopic     Status: Abnormal   Collection Time: 04/01/22  2:24 PM  Result Value Ref Range   Specific Gravity, UA 1.015 1.005 - 1.030   pH, UA 5.0 5.0 - 7.5   Color, UA Yellow Yellow   Appearance Ur Clear Clear   Leukocytes,UA Negative Negative   Protein,UA Trace Negative/Trace   Glucose, UA 3+ (A) Negative   Ketones, UA Negative Negative   RBC, UA Negative Negative   Bilirubin, UA Negative Negative   Urobilinogen, Ur 0.2 0.2 - 1.0 mg/dL   Nitrite, UA Negative Negative   Microscopic Examination Comment    Narrative   Performed at:  [facility] [address] [place], [place]  [id number] Lab Director: Mina Marble MT, Phone:  [phone]     BMET No results for input(s): "NA", "K", "CL", "CO2", "GLUCOSE", "BUN", "CREATININE", "CALCIUM" in the last 72 hours. PT/INR No results for input(s): "LABPROT", "INR" in the last 72 hours. ABG No results for input(s): "PHART", "HCO3" in the last 72 hours.  Invalid input(s): "PCO2", "PO2" UA has 3+ glucose.  Studies/Results: No results found. Procedure: Flexible cystoscopy.  He was prepped with betadine and 2% lidocaine jelly.  Cipro 532m given.  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   The prostate is short with lateral lobe hyperplasia and a tight BN.  The bladder has mild trabeculation without mucosal  lesions.  UO's are normal.     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   I will try him on Gemtesa.  Samples given and side effects reviewed. I will get him set up for urodynamics and then return with the results.   ED.  Tadalafil daily refilled.   Meds ordered this encounter  Medications   tadalafil (CIALIS) 5 MG tablet    Sig: Take 1 tablet (5 mg total) by mouth daily as needed for erectile dysfunction.    Dispense:  30 tablet    Refill:  11   ciprofloxacin (CIPRO) tablet 500 mg   Vibegron 75 MG TABS    Sig: Take 75  mg by mouth daily.    Dispense:  42 tablet    Refill:  0     Orders Placed This Encounter  Procedures   Urinalysis, Routine w reflex microscopic   Ambulatory referral to Urology    Referral Priority:   Routine    Referral Type:   Consultation    Referral Reason:   Specialty Services Required    Referred to Provider:   Irine Seal, MD    Requested Specialty:   Urology    Number of Visits Requested:   1   PR COMPLEX UROFLOWMETRY   BLADDER SCAN AMB NON-IMAGING     Return in about 6 years (around 04/01/2028) for f/u with urodynamic results in 4-6 weeks. .    CC: [name] FNP     Irine Seal 12/1/2023Patient ID: Kathyrn Lass, male   DOB: Jan 17, 1956, 66 y.o.   MRN: [id number]

## 2022-04-01 NOTE — Progress Notes (Signed)
Pt here today for bladder scan. Bladder was scanned and 31 was visualized.   Performed by Marisue Brooklyn, CMA  Uroflow  Peak Flow: 14 ml Average Flow: 6 ml Voided Volume: 149 ml Voiding Time: 25 sec Flow Time: 23 sec Time to Peak Flow: 12 sec

## 2022-04-02 DIAGNOSIS — M9901 Segmental and somatic dysfunction of cervical region: Secondary | ICD-10-CM | POA: Diagnosis not present

## 2022-04-02 DIAGNOSIS — M9902 Segmental and somatic dysfunction of thoracic region: Secondary | ICD-10-CM | POA: Diagnosis not present

## 2022-04-02 DIAGNOSIS — M546 Pain in thoracic spine: Secondary | ICD-10-CM | POA: Diagnosis not present

## 2022-04-02 DIAGNOSIS — M542 Cervicalgia: Secondary | ICD-10-CM | POA: Diagnosis not present

## 2022-04-02 LAB — URINALYSIS, ROUTINE W REFLEX MICROSCOPIC
Bilirubin, UA: NEGATIVE
Ketones, UA: NEGATIVE
Leukocytes,UA: NEGATIVE
Nitrite, UA: NEGATIVE
RBC, UA: NEGATIVE
Specific Gravity, UA: 1.015 (ref 1.005–1.030)
Urobilinogen, Ur: 0.2 mg/dL (ref 0.2–1.0)
pH, UA: 5 (ref 5.0–7.5)

## 2022-04-05 ENCOUNTER — Ambulatory Visit (INDEPENDENT_AMBULATORY_CARE_PROVIDER_SITE_OTHER): Payer: Medicare Other | Admitting: Nurse Practitioner

## 2022-04-05 ENCOUNTER — Encounter: Payer: Self-pay | Admitting: Nurse Practitioner

## 2022-04-05 VITALS — BP 122/68 | HR 97 | Temp 97.7°F | Ht 69.0 in | Wt 227.0 lb

## 2022-04-05 DIAGNOSIS — Z794 Long term (current) use of insulin: Secondary | ICD-10-CM

## 2022-04-05 DIAGNOSIS — K7581 Nonalcoholic steatohepatitis (NASH): Secondary | ICD-10-CM | POA: Diagnosis not present

## 2022-04-05 DIAGNOSIS — E782 Mixed hyperlipidemia: Secondary | ICD-10-CM

## 2022-04-05 DIAGNOSIS — E6609 Other obesity due to excess calories: Secondary | ICD-10-CM

## 2022-04-05 DIAGNOSIS — M9902 Segmental and somatic dysfunction of thoracic region: Secondary | ICD-10-CM | POA: Diagnosis not present

## 2022-04-05 DIAGNOSIS — N1831 Chronic kidney disease, stage 3a: Secondary | ICD-10-CM

## 2022-04-05 DIAGNOSIS — I129 Hypertensive chronic kidney disease with stage 1 through stage 4 chronic kidney disease, or unspecified chronic kidney disease: Secondary | ICD-10-CM

## 2022-04-05 DIAGNOSIS — I10 Essential (primary) hypertension: Secondary | ICD-10-CM

## 2022-04-05 DIAGNOSIS — Z6833 Body mass index (BMI) 33.0-33.9, adult: Secondary | ICD-10-CM

## 2022-04-05 DIAGNOSIS — M542 Cervicalgia: Secondary | ICD-10-CM | POA: Diagnosis not present

## 2022-04-05 DIAGNOSIS — E1122 Type 2 diabetes mellitus with diabetic chronic kidney disease: Secondary | ICD-10-CM

## 2022-04-05 DIAGNOSIS — M546 Pain in thoracic spine: Secondary | ICD-10-CM | POA: Diagnosis not present

## 2022-04-05 DIAGNOSIS — M9901 Segmental and somatic dysfunction of cervical region: Secondary | ICD-10-CM | POA: Diagnosis not present

## 2022-04-05 LAB — LIPID PANEL
Chol/HDL Ratio: 3.6 ratio (ref 0.0–5.0)
Cholesterol, Total: 146 mg/dL (ref 100–199)
HDL: 41 mg/dL (ref >39)
LDL Chol Calc (NIH): 53 mg/dL (ref 0–99)
Triglycerides: 340 mg/dL — ABNORMAL HIGH (ref 0–149)
VLDL Cholesterol Cal: 52 mg/dL — ABNORMAL HIGH (ref 5–40)

## 2022-04-05 LAB — BMP8+EGFR
BUN/Creatinine Ratio: 14 (ref 10–24)
BUN: 22 mg/dL (ref 8–27)
CO2: 19 mmol/L — ABNORMAL LOW (ref 20–29)
Calcium: 9.9 mg/dL (ref 8.6–10.2)
Chloride: 101 mmol/L (ref 96–106)
Creatinine, Ser: 1.54 mg/dL — ABNORMAL HIGH (ref 0.76–1.27)
Glucose: 177 mg/dL — ABNORMAL HIGH (ref 70–99)
Potassium: 4.4 mmol/L (ref 3.5–5.2)
Sodium: 138 mmol/L (ref 134–144)
eGFR: 49 mL/min/{1.73_m2} — ABNORMAL LOW (ref >59)

## 2022-04-05 LAB — HEMOGLOBIN A1C
Est. average glucose Bld gHb Est-mCnc: 146 mg/dL
Hgb A1c MFr Bld: 6.7 % — ABNORMAL HIGH (ref 4.8–5.6)

## 2022-04-05 NOTE — Patient Instructions (Signed)

## 2022-04-05 NOTE — Progress Notes (Signed)
I,Tianna Badgett,acting as a Education administrator for [employer], FNP.,have documented all relevant documentation on the behalf of [name], FNP,as directed by  [name], FNP while in the presence of [name], [place].  Subjective:     Patient ID: Robert Walters , male    DOB: 09/30/1955 , 66 y.o.   MRN: [id number]   Chief Complaint  Patient presents with   Diabetes    HPI  Patient presents today for dm and bp check. No other issues. Continues with behavioral health, no changes. He is due to see Dr. Debara Pickett in one year. He went to PT a few times and stopped due to not having exercises but is now going to chiropractor due to still having neck pain.   Diabetes He presents for his follow-up diabetic visit. He has type 2 diabetes mellitus. His disease course has been stable. There are no hypoglycemic associated symptoms. Pertinent negatives for hypoglycemia include no dizziness or headaches. There are no diabetic associated symptoms. Pertinent negatives for diabetes include no chest pain, no fatigue, no polydipsia, no polyphagia and no polyuria. There are no hypoglycemic complications. Symptoms are stable. Diabetic complications include heart disease. Risk factors for coronary artery disease include diabetes mellitus, sedentary lifestyle, male sex and obesity. Current diabetic treatment includes oral agent (triple therapy) (Continues with tresiba, jardiance and rybelsus). He is compliant with treatment all of the time. He is following a generally unhealthy diet. He has not had a previous visit with a dietitian. He rarely participates in exercise. (Blood sugars in 130-160, occasionally will be 180) An ACE inhibitor/angiotensin II receptor blocker is being taken. He does not see a podiatrist.Eye exam is current.  Hypertension This is a chronic problem. The current episode started more than 1 year ago. The problem is unchanged. The problem is controlled. Pertinent negatives include no anxiety, chest pain,  headaches or palpitations. There are no associated agents to hypertension. Risk factors for coronary artery disease include obesity and sedentary lifestyle. There are no compliance problems.  There is no history of angina. There is no history of chronic renal disease.     Past Medical History:  Diagnosis Date   Acid reflux disease    Bipolar 1 disorder, mixed (HCC)    BPH (benign prostatic hypertrophy)    CVA (cerebral vascular accident) ([place]) 07/2015   Gastritis    H/O hiatal hernia    Hyperlipidemia    Hypertension    Normal nuclear stress test Dec 2011   Obesity    TIA (transient ischemic attack) 05/15/2012   "they think I've had one this am @ 0130" (05/15/2012)   Type II diabetes mellitus ([place])    Vertigo      Family History  Problem Relation Age of Onset   Hyperlipidemia Father        in a nursing home   Colon cancer Father    Stroke Mother 20     Current Outpatient Medications:    Ascorbic Acid (VITAMIN C) 500 MG CAPS, Take 500 mg by mouth daily. , Disp: , Rfl:    aspirin EC 81 MG tablet, Take 1 tablet (81 mg total) by mouth daily., Disp: , Rfl:    atorvastatin (LIPITOR) 80 MG tablet, TAKE 1 TABLET BY MOUTH DAILY, Disp: 90 tablet, Rfl: 1   b complex vitamins tablet, Take 1 tablet by mouth daily. , Disp: , Rfl:    cholecalciferol (VITAMIN D3) 25 MCG (1000 UNIT) tablet, Take 2,000 Units by mouth daily., Disp: , Rfl:  CINNAMON PO, Take 2,000 mg by mouth daily., Disp: , Rfl:    Coenzyme Q10 (COQ10) 200 MG CAPS, Take 200 mg by mouth daily., Disp: , Rfl:    empagliflozin (JARDIANCE) 25 MG TABS tablet, Take 1 tablet (25 mg total) by mouth daily before breakfast., Disp: 30 tablet, Rfl: 2   ezetimibe (ZETIA) 10 MG tablet, TAKE 1 TABLET BY MOUTH DAILY GENERIC EQUIVALENT FOR ZETIA, Disp: 90 tablet, Rfl: 3   FLUoxetine (PROZAC) 40 MG capsule, TAKE ONE CAPSULE BY MOUTH EVERY MORNING, Disp: 90 capsule, Rfl: 0   Garlic 062 MG CAPS, Take 500 mg by mouth daily., Disp: , Rfl:    glucose  blood (ONETOUCH ULTRA) test strip, USE 1 STRIP TO CHECK GLUCOSE 3 TIMES DAILY, Disp: 300 strip, Rfl: 3   insulin degludec (TRESIBA FLEXTOUCH) 100 UNIT/ML FlexTouch Pen, Inject 55 Units into the skin daily., Disp: 15 mL, Rfl: 1   Insulin Pen Needle (PEN NEEDLES) 32G X 4 MM MISC, Use as directed, Disp: 100 each, Rfl: 2   lamoTRIgine (LAMICTAL) 150 MG tablet, Take 1 tablet (150 mg total) by mouth 2 (two) times daily., Disp: 180 tablet, Rfl: 1   LORazepam (ATIVAN) 1 MG tablet, TAKE 1 TABLET BY MOUTH THREE TIMES DAILY AS NEEDED FOR ANXIETY., Disp: 270 tablet, Rfl: 0   Menthol-Methyl Salicylate (ARTHRITIS HOT) 10-15 % CREA, Apply 1 application  topically daily as needed (pain)., Disp: , Rfl:    Menthol-Methyl Salicylate (ICY HOT) 69-48 % STCK, Apply 1 application topically 3 (three) times daily as needed (pain)., Disp: , Rfl:    metFORMIN (GLUCOPHAGE) 1000 MG tablet, TAKE 1 TABLET BY MOUTH TWICE DAILY WITH MEALS, Disp: 180 tablet, Rfl: 1   metoprolol tartrate (LOPRESSOR) 25 MG tablet, TAKE 1 TABLET BY MOUTH 2 TIMES DAILY GENERIC EQUIVALENT FOR LOPRESSOR., Disp: 180 tablet, Rfl: 3   Multiple Vitamin (MULTIVITAMIN WITH MINERALS) TABS tablet, Take 1 tablet by mouth daily., Disp: , Rfl:    Omega-3 Fatty Acids (FISH OIL) 1000 MG CAPS, Take 2,000 mg by mouth daily., Disp: , Rfl:    omeprazole (PRILOSEC) 40 MG capsule, TAKE ONE CAPSULE BY MOUTH ONCE DAILY., Disp: 90 capsule, Rfl: 0   Semaglutide (RYBELSUS) 14 MG TABS, Take 1 tablet (14 mg total) by mouth daily at 6 (six) AM., Disp: 90 tablet, Rfl: 0   tadalafil (CIALIS) 5 MG tablet, Take 1 tablet (5 mg total) by mouth daily as needed for erectile dysfunction., Disp: 30 tablet, Rfl: 11   valsartan (DIOVAN) 320 MG tablet, Take 1 tablet (320 mg total) by mouth daily., Disp: 90 tablet, Rfl: 3   Vibegron 75 MG TABS, Take 75 mg by mouth daily., Disp: 42 tablet, Rfl: 0  Current Facility-Administered Medications:    ciprofloxacin (CIPRO) tablet 500 mg, 500 mg, Oral,  Once, Irine Seal, MD   No Known Allergies   Review of Systems  Constitutional: Negative.  Negative for fatigue.  Respiratory: Negative.    Cardiovascular: Negative.  Negative for chest pain, palpitations and leg swelling.  Gastrointestinal: Negative.   Endocrine: Negative for polydipsia, polyphagia and polyuria.  Neurological: Negative.  Negative for dizziness and headaches.  Psychiatric/Behavioral: Negative.       Today's Vitals   04/05/22 0926  BP: 122/68  Pulse: 97  Temp: 97.7 F (36.5 C)  TempSrc: Oral  Weight: 227 lb (103 kg)  Height: _0  (1.753 m)   Body mass index is 33.52 kg/m.   Objective:  Physical Exam Vitals reviewed.  Constitutional:  General: He is not in acute distress.    Appearance: Normal appearance. He is obese.  Eyes:     Extraocular Movements: Extraocular movements intact.     Conjunctiva/sclera: Conjunctivae normal.     Pupils: Pupils are equal, round, and reactive to light.  Cardiovascular:     Rate and Rhythm: Normal rate and regular rhythm.     Pulses: Normal pulses.     Heart sounds: Normal heart sounds. No murmur heard. Pulmonary:     Effort: Pulmonary effort is normal. No respiratory distress.     Breath sounds: Normal breath sounds. No wheezing.  Musculoskeletal:        General: No swelling or tenderness (posterior neck). Normal range of motion.  Skin:    General: Skin is warm and dry.     Capillary Refill: Capillary refill takes less than 2 seconds.  Neurological:     General: No focal deficit present.     Mental Status: He is alert and oriented to person, place, and time.     Cranial Nerves: No cranial nerve deficit.     Motor: No weakness.  Psychiatric:        Mood and Affect: Mood normal.        Behavior: Behavior normal.        Thought Content: Thought content normal.        Judgment: Judgment normal.         Assessment And Plan:     1. Type 2 diabetes mellitus with stage 3a chronic kidney disease, with  long-term current use of insulin (HCC) Comments: HgbA1c has been stable, continue with medications. Also gets patient assistance for Rybelsus and Tresiba - BMP8+EGFR - Hemoglobin A1c  2. Mixed hyperlipidemia Comments: Continue f/u with Dr. Debara Pickett. - Lipid panel  3. Essential hypertension Comments: Blood pressure is well controlled, continue current medications - BMP8+EGFR  4. NASH (nonalcoholic steatohepatitis) Comments: Stable, continue f/u with GI  5. Class 1 obesity due to excess calories with serious comorbidity and body mass index (BMI) of 33.0 to 33.9 in adult he is encouraged to strive for BMI less than 30 to decrease cardiac risk. Advised to aim for at least 150 minutes of exercise per week.    Patient was given opportunity to ask questions. Patient verbalized understanding of the plan and was able to repeat key elements of the plan. All questions were answered to their satisfaction.  [name], FNP   I, [name], FNP, have reviewed all documentation for this visit. The documentation on 04/05/22 for the exam, diagnosis, procedures, and orders are all accurate and complete.   IF YOU HAVE BEEN REFERRED TO A SPECIALIST, IT MAY TAKE 1-2 WEEKS TO SCHEDULE/PROCESS THE REFERRAL. IF YOU HAVE NOT HEARD FROM US/SPECIALIST IN TWO WEEKS, PLEASE GIVE [place] A CALL AT [phone].   THE PATIENT IS ENCOURAGED TO PRACTICE SOCIAL DISTANCING DUE TO THE COVID-19 PANDEMIC.

## 2022-04-06 ENCOUNTER — Telehealth: Payer: Self-pay

## 2022-04-06 NOTE — Chronic Care Management (AMB) (Signed)
Faxed Corrected prescription form to [place] patient assistance for Jardiance.  Also mailing back patient assistance application for Novo Nordisk to patient, he is missing a signature and requesting it to be returned to PCP office.    Pattricia Boss, [place] Pharmacist Assistant [phone]

## 2022-04-08 ENCOUNTER — Ambulatory Visit: Payer: Medicare Other

## 2022-04-12 DIAGNOSIS — I739 Peripheral vascular disease, unspecified: Secondary | ICD-10-CM | POA: Diagnosis not present

## 2022-04-14 ENCOUNTER — Telehealth: Payer: Self-pay

## 2022-04-14 NOTE — Telephone Encounter (Signed)
Patient left a voice message 04-14-2022.  Patient has not received a call to schedule his urodynamics procedure.  Please advise.  Call back:  [phone]   Thanks, Helene Kelp

## 2022-04-14 NOTE — Telephone Encounter (Signed)
Made patient aware that a referral was send to [facility] urology for urodynamics procedure. Made patient aware to contact [facility] to scheduled urodynamics procedure. Patient voiced understanding

## 2022-04-16 ENCOUNTER — Telehealth: Payer: Self-pay

## 2022-04-16 ENCOUNTER — Other Ambulatory Visit: Payer: Self-pay | Admitting: Nurse Practitioner

## 2022-04-16 DIAGNOSIS — E1165 Type 2 diabetes mellitus with hyperglycemia: Secondary | ICD-10-CM

## 2022-04-16 NOTE — Chronic Care Management (AMB) (Signed)
04-16-2022: Patient called following up on tresiba delivery. Contacted [facility] and was told medication was delivered to office on 04-08-2022. Patient was informed to pick medication up on Monday.  [place] Pharmacist Assistant [phone]

## 2022-04-22 ENCOUNTER — Ambulatory Visit (INDEPENDENT_AMBULATORY_CARE_PROVIDER_SITE_OTHER): Payer: Medicare Other

## 2022-04-22 ENCOUNTER — Ambulatory Visit: Payer: Medicare Other

## 2022-04-22 ENCOUNTER — Ambulatory Visit: Payer: Medicare Other | Admitting: Nurse Practitioner

## 2022-04-22 VITALS — Ht 69.0 in | Wt 229.0 lb

## 2022-04-22 DIAGNOSIS — Z0000 Encounter for general adult medical examination without abnormal findings: Secondary | ICD-10-CM | POA: Diagnosis not present

## 2022-04-22 NOTE — Progress Notes (Signed)
I connected with Gwynne Edinger today by telephone and verified that I am speaking with the correct person using two identifiers. Location patient: home Location provider: work Persons participating in the virtual visit: [place], Bradt LPN.   I discussed the limitations, risks, security and privacy concerns of performing an evaluation and management service by telephone and the availability of in person appointments. I also discussed with the patient that there may be a patient responsible charge related to this service. The patient expressed understanding and verbally consented to this telephonic visit.    Interactive audio and video telecommunications were attempted between this provider and patient, however failed, due to patient having technical difficulties OR patient did not have access to video capability.  We continued and completed visit with audio only.     Vital signs may be patient reported or missing.  Subjective:   Robert Walters is a 66 y.o. male who presents for Medicare Annual/Subsequent preventive examination.  Review of Systems     Cardiac Risk Factors include: advanced age (>30mn, >>28women);diabetes mellitus;hypertension;male gender;obesity (BMI >30kg/m2)     Objective:    Today's Vitals   04/22/22 1428  Weight: 229 lb (103.9 kg)  Height: _0  (1.753 m)   Body mass index is 33.82 kg/m.     04/22/2022    2:34 PM 12/16/2021    3:05 PM 04/02/2021    9:02 AM 02/04/2021   12:18 PM 02/03/2021    7:12 AM 01/30/2021   10:16 AM 03/26/2020   10:51 AM  Advanced Directives  Does Patient Have a Medical Advance Directive? _1  No No  Would patient like information on creating a medical advance directive?  No - Patient declined  No - Patient declined  No - Patient declined No - Patient declined    Current Medications (verified) Outpatient Encounter Medications as of 04/22/2022  Medication Sig   Ascorbic Acid (VITAMIN C) 500 MG CAPS Take 500 mg  by mouth daily.    aspirin EC 81 MG tablet Take 1 tablet (81 mg total) by mouth daily.   atorvastatin (LIPITOR) 80 MG tablet TAKE 1 TABLET BY MOUTH DAILY   b complex vitamins tablet Take 1 tablet by mouth daily.    cholecalciferol (VITAMIN D3) 25 MCG (1000 UNIT) tablet Take 2,000 Units by mouth daily.   CINNAMON PO Take 2,000 mg by mouth daily.   Coenzyme Q10 (COQ10) 200 MG CAPS Take 200 mg by mouth daily.   empagliflozin (JARDIANCE) 25 MG TABS tablet Take 1 tablet (25 mg total) by mouth daily before breakfast.   ezetimibe (ZETIA) 10 MG tablet TAKE 1 TABLET BY MOUTH DAILY GENERIC EQUIVALENT FOR ZETIA   FLUoxetine (PROZAC) 40 MG capsule TAKE ONE CAPSULE BY MOUTH EVERY MORNING   Garlic 5762MG CAPS Take 500 mg by mouth daily.   glucose blood (ONETOUCH ULTRA) test strip USE 1 STRIP TO CHECK GLUCOSE 3 TIMES DAILY   insulin degludec (TRESIBA FLEXTOUCH) 100 UNIT/ML FlexTouch Pen Inject 55 Units into the skin daily.   Insulin Pen Needle (PEN NEEDLES) 32G X 4 MM MISC Use as directed   lamoTRIgine (LAMICTAL) 150 MG tablet Take 1 tablet (150 mg total) by mouth 2 (two) times daily.   LORazepam (ATIVAN) 1 MG tablet TAKE 1 TABLET BY MOUTH THREE TIMES DAILY AS NEEDED FOR ANXIETY.   Menthol-Methyl Salicylate (ARTHRITIS HOT) 10-15 % CREA Apply 1 application  topically daily as needed (pain).   Menthol-Methyl Salicylate (ICY HOT) 183-15%  STCK Apply 1 application topically 3 (three) times daily as needed (pain).   metFORMIN (GLUCOPHAGE) 1000 MG tablet TAKE 1 TABLET BY MOUTH TWICE DAILY WITH MEALS   metoprolol tartrate (LOPRESSOR) 25 MG tablet TAKE 1 TABLET BY MOUTH 2 TIMES DAILY GENERIC EQUIVALENT FOR LOPRESSOR.   Multiple Vitamin (MULTIVITAMIN WITH MINERALS) TABS tablet Take 1 tablet by mouth daily.   Omega-3 Fatty Acids (FISH OIL) 1000 MG CAPS Take 2,000 mg by mouth daily.   omeprazole (PRILOSEC) 40 MG capsule TAKE ONE CAPSULE BY MOUTH ONCE DAILY.   Semaglutide (RYBELSUS) 14 MG TABS Take 1 tablet (14 mg total)  by mouth daily at 6 (six) AM.   tadalafil (CIALIS) 5 MG tablet Take 1 tablet (5 mg total) by mouth daily as needed for erectile dysfunction.   valsartan (DIOVAN) 320 MG tablet Take 1 tablet (320 mg total) by mouth daily.   Vibegron 75 MG TABS Take 75 mg by mouth daily.   Facility-Administered Encounter Medications as of 04/22/2022  Medication   ciprofloxacin (CIPRO) tablet 500 mg    Allergies (verified) Patient has no known allergies.   History: Past Medical History:  Diagnosis Date   Acid reflux disease    Bipolar 1 disorder, mixed (HCC)    BPH (benign prostatic hypertrophy)    CVA (cerebral vascular accident) ([place]) 07/2015   Gastritis    H/O hiatal hernia    Hyperlipidemia    Hypertension    Normal nuclear stress test Dec 2011   Obesity    TIA (transient ischemic attack) 05/15/2012   "they think I've had one this am @ 0130" (05/15/2012)   Type II diabetes mellitus ([place])    Vertigo    Past Surgical History:  Procedure Laterality Date   BILIARY STENT PLACEMENT N/A 09/17/2016   Procedure: BILIARY STENT PLACEMENT;  Surgeon: [name], MD;  [facility];  Service: Endoscopy;  Laterality: N/A;   CARDIOVASCULAR STRESS TEST  2009   EF 63%   CHOLECYSTECTOMY N/A 08/25/2016   Procedure: LAPAROSCOPIC CHOLECYSTECTOMY;  Surgeon: [name], MD;  Location: [facility];  Service: General;  Laterality: N/A;   COLONOSCOPY     COLONOSCOPY WITH PROPOFOL N/A 02/03/2021   Procedure: COLONOSCOPY WITH PROPOFOL;  Surgeon: Harvel Quale, MD;  [facility]: [facility];  Service: Gastroenterology;  Laterality: N/A;    incomplete colonoscopy poor prep   COLONOSCOPY WITH PROPOFOL N/A 02/04/2021   Procedure: COLONOSCOPY WITH PROPOFOL;  Surgeon: Harvel Quale, MD;  [facility];  Service: Gastroenterology;  Laterality: N/A;  1:45   ERCP N/A 09/17/2016   Procedure: ENDOSCOPIC RETROGRADE CHOLANGIOPANCREATOGRAPHY (ERCP);  Surgeon: [name], MD;  [facility]  [facility];  Service: Endoscopy;  Laterality: N/A;  730   ERCP N/A 12/03/2016   Procedure: ENDOSCOPIC RETROGRADE CHOLANGIOPANCREATOGRAPHY (ERCP);  Surgeon: [name], MD;  [facility];  Service: Gastroenterology;  Laterality: N/A;   ESOPHAGEAL DILATION  02/03/2021   Procedure: ESOPHAGEAL DILATION;  Surgeon: Montez Morita, Quillian Quince, MD;  [facility];  Service: Gastroenterology;;  savory 16 and 18 fr   ESOPHAGOGASTRODUODENOSCOPY     ESOPHAGOGASTRODUODENOSCOPY (EGD) WITH PROPOFOL N/A 02/03/2021   Procedure: ESOPHAGOGASTRODUODENOSCOPY (EGD) WITH PROPOFOL;  Surgeon: Harvel Quale, MD;  [facility];  Service: Gastroenterology;  Laterality: N/A;   GASTROINTESTINAL STENT REMOVAL N/A 12/03/2016   Procedure: BILIARY STENT REMOVAL;  Surgeon: [name], MD;  [facility];  Service: Gastroenterology;  Laterality: N/A;   NO  PAST SURGERIES     POLYPECTOMY  02/04/2021   Procedure: POLYPECTOMY INTESTINAL;  Surgeon: Harvel Quale, MD;  [facility];  Service: Gastroenterology;;   SPHINCTEROTOMY N/A 09/17/2016   Procedure: Joan Mayans;  Surgeon: [name], MD;  [facility];  Service: Endoscopy;  Laterality: N/A;   Family History  Problem Relation Age of Onset   Hyperlipidemia Father        in a nursing home   Colon cancer Father    Stroke Mother 31   Social History   Socioeconomic History   Marital status: Widowed    Spouse name: Not on file   Number of children: Not on file   Years of education: Not on file   Highest education level: Not on file  Occupational History   Occupation: disabled  Tobacco Use   Smoking status: Never   Smokeless tobacco: Never  Vaping Use   Vaping Use: Never used  Substance and Sexual Activity   Alcohol use: No   Drug use: No   Sexual activity: Not Currently  Other Topics Concern   Not on file  Social History Narrative   Not on file   Social  Determinants of Health   Financial Resource Strain: Low Risk  (04/22/2022)   Overall Financial Resource Strain (CARDIA)    Difficulty of Paying Living Expenses: Not hard at all  Food Insecurity: No Food Insecurity (04/22/2022)   Hunger Vital Sign    Worried About Running Out of Food in the Last Year: Never true    [place] in the Last Year: Never true  Transportation Needs: No Transportation Needs (04/22/2022)   PRAPARE - Hydrologist (Medical): No    Lack of Transportation (Non-Medical): No  Physical Activity: Inactive (04/22/2022)   Exercise Vital Sign    Days of Exercise per Week: 0 days    Minutes of Exercise per Session: 0 min  Stress: No Stress Concern Present (04/22/2022)   [place]    Feeling of Stress : Not at all  Social Connections: Not on file    Tobacco Counseling Counseling given: Not Answered   Clinical Intake:  Pre-visit preparation completed: Yes  Pain : No/denies pain     Nutritional Status: BMI > 30  Obese Nutritional Risks: None Diabetes: Yes  How often do you need to have someone help you when you read instructions, pamphlets, or other written materials from your doctor or pharmacy?: 1 - Never  Diabetic?yes Nutrition Risk Assessment:  Has the patient had any N/V/D within the last 2 months?  No  Does the patient have any non-healing wounds?  No  Has the patient had any unintentional weight loss or weight gain?  No   Diabetes:  Is the patient diabetic?  Yes  If diabetic, was a CBG obtained today?  No  Did the patient bring in their glucometer from home?  No  How often do you monitor your CBG's? daily.   Financial Strains and Diabetes Management:  Are you having any financial strains with the device, your supplies or your medication? No .  Does the patient want to be seen by Chronic Care Management for management of their diabetes?  No   Would the patient like to be referred to a Nutritionist or for Diabetic Management?  No   Diabetic Exams:  Diabetic Eye Exam: Completed 10/20/2021 Diabetic Foot Exam: Completed 07/13/2021   Interpreter  Needed?: No  Information entered by :: NAllen LPN   Activities of Daily Living    04/22/2022    2:35 PM  In your present state of health, do you have any difficulty performing the following activities:  Hearing? 0  Vision? 0  Difficulty concentrating or making decisions? 0  Walking or climbing stairs? 0  Dressing or bathing? 0  Doing errands, shopping? 0  Preparing Food and eating ? N  Using the Toilet? N  In the past six months, have you accidently leaked urine? Y  Do you have problems with loss of bowel control? N  Managing your Medications? N  Managing your Finances? N  Housekeeping or managing your Housekeeping? N    Patient Care Team: [name], FNP as PCP - General (General Practice) Nahser, Wonda Cheng, MD as PCP - Cardiology (Cardiology)  Indicate any recent Medical Services you may have received from other than [facility] providers in the past year (date may be approximate).     Assessment:   This is a routine wellness examination for Robert Walters.  Hearing/Vision screen Vision Screening - Comments:: Regular eye exams, [facility]  Dietary issues and exercise activities discussed: Current Exercise Habits: The patient does not participate in regular exercise at present   Goals Addressed             This Visit's Progress    Patient Stated       04/22/2022, wants to be more active       Depression Screen    04/22/2022    2:35 PM 04/05/2022    9:29 AM 04/05/2022    9:18 AM 11/16/2021   10:55 AM 07/13/2021   11:35 AM 04/14/2021    9:23 AM 04/02/2021    9:04 AM  PHQ 2/9 Scores  PHQ - 2 Score 0 0 0 0 0 0 0  PHQ- 9 Score  0   0 0     Fall Risk    04/22/2022    2:34 PM 04/05/2022    9:17 AM 11/16/2021   10:54 AM 04/02/2021    9:03 AM 03/26/2020   10:52  AM  Fall Risk   Falls in the past year? 0 0 _0 Comment    tripped over something lost balance, tripped x 2  Number falls in past yr: 0 0 _1 Injury with Fall? 0 0 0 0 0  Risk for fall due to : Medication side effect No Fall Risks Impaired mobility History of fall(s);Medication side effect Medication side effect;History of fall(s)  Follow up Falls prevention discussed;Education provided;Falls evaluation completed Falls evaluation completed Falls evaluation completed Falls evaluation completed;Education provided;Falls prevention discussed Falls evaluation completed;Education provided;Falls prevention discussed    FALL RISK PREVENTION PERTAINING TO THE HOME:  Any stairs in or around the home? No  If so, are there any without handrails? No  Home free of loose throw rugs in walkways, pet beds, electrical cords, etc? Yes  Adequate lighting in your home to reduce risk of falls? Yes   ASSISTIVE DEVICES UTILIZED TO PREVENT FALLS:  Life alert? No  Use of a cane, walker or w/c? No  Grab bars in the bathroom? Yes  Shower chair or bench in shower? Yes  Elevated toilet seat or a handicapped toilet? No   TIMED UP AND GO:  Was the test performed? No .      Cognitive Function:        04/22/2022    2:36 PM  04/02/2021    9:05 AM 03/26/2020   10:55 AM 03/21/2019   10:12 AM  6CIT Screen  What Year? 0 points 0 points 0 points 0 points  What month? 0 points 0 points 0 points 0 points  What time? 0 points 0 points 0 points 0 points  Count back from 20 0 points 0 points 0 points 0 points  Months in reverse 0 points 0 points 0 points 0 points  Repeat phrase 2 points 4 points 2 points 8 points  Total Score 2 points 4 points 2 points 8 points    Immunizations Immunization History  Administered Date(s) Administered   DTaP 10/05/2011   Influenza, High Dose Seasonal PF 01/15/2019, 12/31/2020   Influenza-Unspecified 03/16/2018, 03/21/2020, 03/15/2022   MODERNA COVID-19 SARS-COV-2 PEDS  BIVALENT BOOSTER 6Y-11Y 04/01/2022   Moderna Covid-19 Vaccine Bivalent Booster 13yr & up 05/22/2021   Moderna Sars-Covid-2 Vaccination 12/07/2019, 01/04/2020, 07/18/2020, 12/05/2020   Pneumococcal Conjugate-13 03/21/2019   Pneumococcal Polysaccharide-23 05/16/2012, 03/21/2020   Tdap 10/01/2020   Zoster Recombinat (Shingrix) 03/21/2019, 11/19/2019    TDAP status: Up to date  Flu Vaccine status: Up to date  Pneumococcal vaccine status: Up to date  Covid-19 vaccine status: Completed vaccines  Qualifies for Shingles Vaccine? Yes   Zostavax completed Yes   Shingrix Completed?: Yes  Screening Tests Health Maintenance  Topic Date Due   Medicare Annual Wellness (AWV)  04/02/2022   COVID-19 Vaccine (7 - 2023-24 season) 05/27/2022   FOOT EXAM  07/14/2022   HEMOGLOBIN A1C  10/05/2022   OPHTHALMOLOGY EXAM  10/21/2022   Diabetic kidney evaluation - Urine ACR  11/17/2022   Diabetic kidney evaluation - eGFR measurement  04/06/2023   DTaP/Tdap/Td (3 - Td or Tdap) 10/02/2030   COLONOSCOPY (Pts 45-422yrInsurance coverage will need to be confirmed)  02/05/2031   Pneumonia Vaccine 6556Years old  Completed   INFLUENZA VACCINE  Completed   Hepatitis C Screening  Completed   Zoster Vaccines- Shingrix  Completed   HPV VACCINES  Aged Out    Health Maintenance  Health Maintenance Due  Topic Date Due   Medicare Annual Wellness (AWV)  04/02/2022    Colorectal cancer screening: Type of screening: Colonoscopy. Completed 02/04/2021. Repeat every 1 years  Lung Cancer Screening: (Low Dose CT Chest recommended if Age 68-80ears, 30 pack-year currently smoking OR have quit w/in 15years.) does not qualify.   Lung Cancer Screening Referral: no  Additional Screening:  Hepatitis C Screening: does not qualify; Completed 03/24/2018  Vision Screening: Recommended annual ophthalmology exams for early detection of glaucoma and other disorders of the eye. Is the patient up to date with their annual  eye exam?  Yes  Who is the provider or what is the name of the office in which the patient attends annual eye exams? [facility] If pt is not established with a provider, would they like to be referred to a provider to establish care? No .   Dental Screening: Recommended annual dental exams for proper oral hygiene  Community Resource Referral / Chronic Care Management: CRR required this visit?  No   CCM required this visit?  No      Plan:     I have personally reviewed and noted the following in the patient's chart:   Medical and social history Use of alcohol, tobacco or illicit drugs  Current medications and supplements including opioid prescriptions. Patient is not currently taking opioid prescriptions. Functional ability and status Nutritional status Physical activity Advanced  directives List of other physicians Hospitalizations, surgeries, and ER visits in previous 12 months Vitals Screenings to include cognitive, depression, and falls Referrals and appointments  In addition, I have reviewed and discussed with patient certain preventive protocols, quality metrics, and best practice recommendations. A written personalized care plan for preventive services as well as general preventive health recommendations were provided to patient.     Kellie Simmering, LPN   [id number]   Nurse Notes: none  Due to this being a virtual visit, the after visit summary with patients personalized plan was offered to patient via mail or my-chart.  to pick up at office at next visit

## 2022-04-22 NOTE — Patient Instructions (Signed)
[name] , Thank you for taking time to come for your Medicare Wellness Visit. I appreciate your ongoing commitment to your health goals. Please review the following plan we discussed and let me know if I can assist you in the future.   These are the goals we discussed:  Goals      Manage My Medicine     Timeframe:  Long-Range Goal Priority:  High Start Date:       07/07/2020 Expected End Date:                       Follow Up Date: 06/02/2022   In Progress:  - call for medicine refill 2 or 3 days before it runs out - call if I am sick and can't take my medicine - keep a list of all the medicines I take; vitamins and herbals too - use a pillbox to sort medicine - use an alarm clock or phone to remind me to take my medicine    Why is this important?   These steps will help you keep on track with your medicines. Please call if you have any questions       Patient Stated     03/21/2019, want to eat healthy     Patient Stated     03/26/2020, no goals     Patient Stated     04/02/2021, no goals     Patient Stated     04/22/2022, wants to be more active        This is a list of the screening recommended for you and due dates:  Health Maintenance  Topic Date Due   COVID-19 Vaccine (7 - 2023-24 season) 05/27/2022   Complete foot exam   07/14/2022   Hemoglobin A1C  10/05/2022   Eye exam for diabetics  10/21/2022   Yearly kidney health urinalysis for diabetes  11/17/2022   Yearly kidney function blood test for diabetes  04/06/2023   Medicare Annual Wellness Visit  04/23/2023   DTaP/Tdap/Td vaccine (3 - Td or Tdap) 10/02/2030   Colon Cancer Screening  02/05/2031   Pneumonia Vaccine  Completed   Flu Shot  Completed   Hepatitis C Screening: USPSTF Recommendation to screen - Ages 59-79 yo.  Completed   Zoster (Shingles) Vaccine  Completed   HPV Vaccine  Aged Out    Advanced directives: Advance directive discussed with you today.   Conditions/risks identified:  none  Next appointment: Follow up in one year for your annual wellness visit.   Preventive Care 68 Years and Older, Male  Preventive care refers to lifestyle choices and visits with your health care provider that can promote health and wellness. What does preventive care include? A yearly physical exam. This is also called an annual well check. Dental exams once or twice a year. Routine eye exams. Ask your health care provider how often you should have your eyes checked. Personal lifestyle choices, including: Daily care of your teeth and gums. Regular physical activity. Eating a healthy diet. Avoiding tobacco and drug use. Limiting alcohol use. Practicing safe sex. Taking low doses of aspirin every day. Taking vitamin and mineral supplements as recommended by your health care provider. What happens during an annual well check? The services and screenings done by your health care provider during your annual well check will depend on your age, overall health, lifestyle risk factors, and family history of disease. Counseling  Your health care provider may ask you questions about  your: Alcohol use. Tobacco use. Drug use. Emotional well-being. Home and relationship well-being. Sexual activity. Eating habits. History of falls. Memory and ability to understand (cognition). Work and work Statistician. Screening  You may have the following tests or measurements: Height, weight, and BMI. Blood pressure. Lipid and cholesterol levels. These may be checked every 5 years, or more frequently if you are over 81 years old. Skin check. Lung cancer screening. You may have this screening every year starting at age 69 if you have a 30-pack-year history of smoking and currently smoke or have quit within the past 15 years. Fecal occult blood test (FOBT) of the stool. You may have this test every year starting at age 45. Flexible sigmoidoscopy or colonoscopy. You may have a sigmoidoscopy every 5  years or a colonoscopy every 10 years starting at age 14. Prostate cancer screening. Recommendations will vary depending on your family history and other risks. Hepatitis C blood test. Hepatitis B blood test. Sexually transmitted disease (STD) testing. Diabetes screening. This is done by checking your blood sugar (glucose) after you have not eaten for a while (fasting). You may have this done every 1-3 years. Abdominal aortic aneurysm (AAA) screening. You may need this if you are a current or former smoker. Osteoporosis. You may be screened starting at age 54 if you are at high risk. Talk with your health care provider about your test results, treatment options, and if necessary, the need for more tests. Vaccines  Your health care provider may recommend certain vaccines, such as: Influenza vaccine. This is recommended every year. Tetanus, diphtheria, and acellular pertussis (Tdap, Td) vaccine. You may need a Td booster every 10 years. Zoster vaccine. You may need this after age 2. Pneumococcal 13-valent conjugate (PCV13) vaccine. One dose is recommended after age 69. Pneumococcal polysaccharide (PPSV23) vaccine. One dose is recommended after age 46. Talk to your health care provider about which screenings and vaccines you need and how often you need them. This information is not intended to replace advice given to you by your health care provider. Make sure you discuss any questions you have with your health care provider. Document Released: 05/16/2015 Document Revised: 01/07/2016 Document Reviewed: 02/18/2015 Elsevier Interactive Patient Education  2017 [place] Prevention in the Home Falls can cause injuries. They can happen to people of all ages. There are many things you can do to make your home safe and to help prevent falls. What can I do on the outside of my home? Regularly fix the edges of walkways and driveways and fix any cracks. Remove anything that might make you  trip as you walk through a door, such as a raised step or threshold. Trim any bushes or trees on the path to your home. Use bright outdoor lighting. Clear any walking paths of anything that might make someone trip, such as rocks or tools. Regularly check to see if handrails are loose or broken. Make sure that both sides of any steps have handrails. Any raised decks and porches should have guardrails on the edges. Have any leaves, snow, or ice cleared regularly. Use sand or salt on walking paths during winter. Clean up any spills in your garage right away. This includes oil or grease spills. What can I do in the bathroom? Use night lights. Install grab bars by the toilet and in the tub and shower. Do not use towel bars as grab bars. Use non-skid mats or decals in the tub or shower. If you need to  sit down in the shower, use a plastic, non-slip stool. Keep the floor dry. Clean up any water that spills on the floor as soon as it happens. Remove soap buildup in the tub or shower regularly. Attach bath mats securely with double-sided non-slip rug tape. Do not have throw rugs and other things on the floor that can make you trip. What can I do in the bedroom? Use night lights. Make sure that you have a light by your bed that is easy to reach. Do not use any sheets or blankets that are too big for your bed. They should not hang down onto the floor. Have a firm chair that has side arms. You can use this for support while you get dressed. Do not have throw rugs and other things on the floor that can make you trip. What can I do in the kitchen? Clean up any spills right away. Avoid walking on wet floors. Keep items that you use a lot in easy-to-reach places. If you need to reach something above you, use a strong step stool that has a grab bar. Keep electrical cords out of the way. Do not use floor polish or wax that makes floors slippery. If you must use wax, use non-skid floor wax. Do not have  throw rugs and other things on the floor that can make you trip. What can I do with my stairs? Do not leave any items on the stairs. Make sure that there are handrails on both sides of the stairs and use them. Fix handrails that are broken or loose. Make sure that handrails are as long as the stairways. Check any carpeting to make sure that it is firmly attached to the stairs. Fix any carpet that is loose or worn. Avoid having throw rugs at the top or bottom of the stairs. If you do have throw rugs, attach them to the floor with carpet tape. Make sure that you have a light switch at the top of the stairs and the bottom of the stairs. If you do not have them, ask someone to add them for you. What else can I do to help prevent falls? Wear shoes that: Do not have high heels. Have rubber bottoms. Are comfortable and fit you well. Are closed at the toe. Do not wear sandals. If you use a stepladder: Make sure that it is fully opened. Do not climb a closed stepladder. Make sure that both sides of the stepladder are locked into place. Ask someone to hold it for you, if possible. Clearly mark and make sure that you can see: Any grab bars or handrails. First and last steps. Where the edge of each step is. Use tools that help you move around (mobility aids) if they are needed. These include: Canes. Walkers. Scooters. Crutches. Turn on the lights when you go into a dark area. Replace any light bulbs as soon as they burn out. Set up your furniture so you have a clear path. Avoid moving your furniture around. If any of your floors are uneven, fix them. If there are any pets around you, be aware of where they are. Review your medicines with your doctor. Some medicines can make you feel dizzy. This can increase your chance of falling. Ask your doctor what other things that you can do to help prevent falls. This information is not intended to replace advice given to you by your health care provider.  Make sure you discuss any questions you have with your health care  provider. Document Released: 02/13/2009 Document Revised: 09/25/2015 Document Reviewed: 05/24/2014 Elsevier Interactive Patient Education  2017 [employer].

## 2022-04-27 ENCOUNTER — Encounter: Payer: Medicare Other | Admitting: Nurse Practitioner

## 2022-04-28 ENCOUNTER — Other Ambulatory Visit: Payer: Self-pay | Admitting: Physician Assistant

## 2022-04-28 NOTE — Progress Notes (Signed)
Make sure when his next appt with Dr. Debara Pickett is?

## 2022-04-29 ENCOUNTER — Telehealth: Payer: Self-pay

## 2022-04-29 DIAGNOSIS — N3941 Urge incontinence: Secondary | ICD-10-CM | POA: Diagnosis not present

## 2022-04-29 DIAGNOSIS — R3914 Feeling of incomplete bladder emptying: Secondary | ICD-10-CM | POA: Diagnosis not present

## 2022-04-29 NOTE — Chronic Care Management (AMB) (Signed)
[employer] Cares Notification:  Patient has been approved from 05/03/2022 - 05/03/2023 for Jardiance assistance.     Pattricia Boss, [place] Pharmacist Assistant [phone]

## 2022-05-04 ENCOUNTER — Encounter: Payer: Self-pay | Admitting: Nurse Practitioner

## 2022-05-04 ENCOUNTER — Ambulatory Visit (INDEPENDENT_AMBULATORY_CARE_PROVIDER_SITE_OTHER): Payer: Medicare Other | Admitting: Nurse Practitioner

## 2022-05-04 VITALS — BP 128/62 | HR 99 | Temp 98.1°F | Ht 69.0 in | Wt 226.6 lb

## 2022-05-04 DIAGNOSIS — R7989 Other specified abnormal findings of blood chemistry: Secondary | ICD-10-CM | POA: Diagnosis not present

## 2022-05-04 DIAGNOSIS — N1831 Chronic kidney disease, stage 3a: Secondary | ICD-10-CM | POA: Diagnosis not present

## 2022-05-04 DIAGNOSIS — I10 Essential (primary) hypertension: Secondary | ICD-10-CM

## 2022-05-04 DIAGNOSIS — E1122 Type 2 diabetes mellitus with diabetic chronic kidney disease: Secondary | ICD-10-CM | POA: Diagnosis not present

## 2022-05-04 DIAGNOSIS — Z794 Long term (current) use of insulin: Secondary | ICD-10-CM | POA: Diagnosis not present

## 2022-05-04 DIAGNOSIS — Z0001 Encounter for general adult medical examination with abnormal findings: Secondary | ICD-10-CM | POA: Diagnosis not present

## 2022-05-04 DIAGNOSIS — E6609 Other obesity due to excess calories: Secondary | ICD-10-CM

## 2022-05-04 DIAGNOSIS — Z0000 Encounter for general adult medical examination without abnormal findings: Secondary | ICD-10-CM

## 2022-05-04 DIAGNOSIS — Z6833 Body mass index (BMI) 33.0-33.9, adult: Secondary | ICD-10-CM

## 2022-05-04 DIAGNOSIS — M542 Cervicalgia: Secondary | ICD-10-CM

## 2022-05-04 NOTE — Progress Notes (Unsigned)
I,Victoria T Hamilton,acting as a Education administrator for [name], FNP.,have documented all relevant documentation on the behalf of [name], FNP,as directed by  [name], FNP while in the presence of [name], [place].   Subjective:     Patient ID: Robert Walters , male    DOB: 25-Feb-1956 , 67 y.o.   MRN: [id number]   Chief Complaint  Patient presents with   Annual Exam    HPI  Here for HM. He has a urologist and seen in the last couple of months.   Wt Readings from Last 3 Encounters: 05/04/22 : 226 lb 9.6 oz (102.8 kg) 04/22/22 : 229 lb (103.9 kg) 04/05/22 : 227 lb (103 kg)       Past Medical History:  Diagnosis Date   Acid reflux disease    Bipolar 1 disorder, mixed (HCC)    BPH (benign prostatic hypertrophy)    CVA (cerebral vascular accident) ([place]) 07/2015   Gastritis    H/O hiatal hernia    Hyperlipidemia    Hypertension    Normal nuclear stress test Dec 2011   Obesity    TIA (transient ischemic attack) 05/15/2012   "they think I've had one this am @ 0130" (05/15/2012)   Type II diabetes mellitus ([place])    Vertigo      Family History  Problem Relation Age of Onset   Hyperlipidemia Father        in a nursing home   Colon cancer Father    Stroke Mother 50     Current Outpatient Medications:    Ascorbic Acid (VITAMIN C) 500 MG CAPS, Take 500 mg by mouth daily. , Disp: , Rfl:    aspirin EC 81 MG tablet, Take 1 tablet (81 mg total) by mouth daily., Disp: , Rfl:    atorvastatin (LIPITOR) 80 MG tablet, TAKE 1 TABLET BY MOUTH DAILY, Disp: 90 tablet, Rfl: 1   b complex vitamins tablet, Take 1 tablet by mouth daily. , Disp: , Rfl:    cholecalciferol (VITAMIN D3) 25 MCG (1000 UNIT) tablet, Take 2,000 Units by mouth daily., Disp: , Rfl:    CINNAMON PO, Take 2,000 mg by mouth daily., Disp: , Rfl:    Coenzyme Q10 (COQ10) 200 MG CAPS, Take 200 mg by mouth daily., Disp: , Rfl:    empagliflozin (JARDIANCE) 25 MG TABS tablet, Take 1 tablet (25 mg total) by mouth daily  before breakfast., Disp: 30 tablet, Rfl: 2   ezetimibe (ZETIA) 10 MG tablet, TAKE 1 TABLET BY MOUTH DAILY GENERIC EQUIVALENT FOR ZETIA, Disp: 90 tablet, Rfl: 3   FLUoxetine (PROZAC) 40 MG capsule, TAKE ONE CAPSULE BY MOUTH EVERY MORNING, Disp: 90 capsule, Rfl: 0   Garlic 287 MG CAPS, Take 500 mg by mouth daily., Disp: , Rfl:    glucose blood (ONETOUCH ULTRA) test strip, USE 1 STRIP TO CHECK GLUCOSE 3 TIMES DAILY, Disp: 300 strip, Rfl: 3   insulin degludec (TRESIBA FLEXTOUCH) 100 UNIT/ML FlexTouch Pen, Inject 55 Units into the skin daily., Disp: 15 mL, Rfl: 1   Insulin Pen Needle (PEN NEEDLES) 32G X 4 MM MISC, Use as directed, Disp: 100 each, Rfl: 2   lamoTRIgine (LAMICTAL) 150 MG tablet, Take 1 tablet (150 mg total) by mouth 2 (two) times daily., Disp: 180 tablet, Rfl: 1   LORazepam (ATIVAN) 1 MG tablet, TAKE 1 TABLET BY MOUTH THREE TIMES DAILY AS NEEDED FOR ANXIETY., Disp: 270 tablet, Rfl: 0   Menthol-Methyl Salicylate (ARTHRITIS HOT) 10-15 % CREA, Apply  1 application  topically daily as needed (pain)., Disp: , Rfl:    Menthol-Methyl Salicylate (ICY HOT) 78-46 % STCK, Apply 1 application topically 3 (three) times daily as needed (pain)., Disp: , Rfl:    metFORMIN (GLUCOPHAGE) 1000 MG tablet, TAKE 1 TABLET BY MOUTH TWICE DAILY WITH MEALS, Disp: 180 tablet, Rfl: 1   metoprolol tartrate (LOPRESSOR) 25 MG tablet, TAKE 1 TABLET BY MOUTH 2 TIMES DAILY GENERIC EQUIVALENT FOR LOPRESSOR., Disp: 180 tablet, Rfl: 3   Multiple Vitamin (MULTIVITAMIN WITH MINERALS) TABS tablet, Take 1 tablet by mouth daily., Disp: , Rfl:    Omega-3 Fatty Acids (FISH OIL) 1000 MG CAPS, Take 2,000 mg by mouth daily., Disp: , Rfl:    omeprazole (PRILOSEC) 40 MG capsule, TAKE ONE CAPSULE BY MOUTH ONCE DAILY., Disp: 90 capsule, Rfl: 0   Semaglutide (RYBELSUS) 14 MG TABS, Take 1 tablet (14 mg total) by mouth daily at 6 (six) AM., Disp: 90 tablet, Rfl: 0   tadalafil (CIALIS) 5 MG tablet, Take 1 tablet (5 mg total) by mouth daily as  needed for erectile dysfunction., Disp: 30 tablet, Rfl: 11   valsartan (DIOVAN) 320 MG tablet, Take 1 tablet (320 mg total) by mouth daily., Disp: 90 tablet, Rfl: 3   Vibegron 75 MG TABS, Take 75 mg by mouth daily., Disp: 42 tablet, Rfl: 0  Current Facility-Administered Medications:    ciprofloxacin (CIPRO) tablet 500 mg, 500 mg, Oral, Once, Irine Seal, MD   No Known Allergies   Men's preventive visit. Patient Health Questionnaire (PHQ-2) is  [place] Office Visit from 05/04/2022 in [facility]  PHQ-2 Total Score 0     Patient is on a Regular diet - he is trying to eat less fats and eating sugar free foods. Exercising - Marital status: Widowed. Relevant history for alcohol use is:  Social History   Substance and Sexual Activity  Alcohol Use No   Relevant history for tobacco use is:  Social History   Tobacco Use  Smoking Status Never  Smokeless Tobacco Never  .   Review of Systems  Constitutional: Negative.   HENT: Negative.    Eyes: Negative.   Respiratory: Negative.    Cardiovascular: Negative.   Gastrointestinal: Negative.   Genitourinary: Negative.   Musculoskeletal: Negative.   Skin: Negative.   Allergic/Immunologic: Negative.   Neurological: Negative.   Hematological: Negative.   Psychiatric/Behavioral: Negative.       Today's Vitals   05/04/22 1426  BP: 128/62  Pulse: 99  Temp: 98.1 F (36.7 C)  SpO2: 98%  Weight: 226 lb 9.6 oz (102.8 kg)  Height: '5\' 9"'$  (1.753 m)   Body mass index is 33.46 kg/m.  Wt Readings from Last 3 Encounters:  05/04/22 226 lb 9.6 oz (102.8 kg)  04/22/22 229 lb (103.9 kg)  04/05/22 227 lb (103 kg)    Objective:  Physical Exam Vitals reviewed.  Constitutional:      General: He is not in acute distress.    Appearance: Normal appearance. He is obese.  HENT:     Head: Normocephalic and atraumatic.     Right Ear: Tympanic membrane, ear canal and external ear normal. There is no impacted cerumen.      Left Ear: Tympanic membrane, ear canal and external ear normal. There is no impacted cerumen.     Nose: Nose normal.     Mouth/Throat:     Mouth: Mucous membranes are moist.  Eyes:     Extraocular Movements: Extraocular movements intact.  Pupils: Pupils are equal, round, and reactive to light.  Cardiovascular:     Rate and Rhythm: Normal rate and regular rhythm.     Pulses: Normal pulses.     Heart sounds: Normal heart sounds. No murmur heard. Pulmonary:     Effort: Pulmonary effort is normal. No respiratory distress.     Breath sounds: Normal breath sounds. No wheezing.  Abdominal:     General: Abdomen is flat. Bowel sounds are normal. There is no distension.     Palpations: Abdomen is soft.  Genitourinary:    Prostate: Normal.     Rectum: Guaiac result negative.  Musculoskeletal:        General: No swelling or tenderness. Normal range of motion.     Cervical back: Normal range of motion and neck supple. No rigidity.  Skin:    General: Skin is warm.     Capillary Refill: Capillary refill takes less than 2 seconds.  Neurological:     General: No focal deficit present.     Mental Status: He is alert and oriented to person, place, and time.     Cranial Nerves: No cranial nerve deficit.     Motor: No weakness.  Psychiatric:        Mood and Affect: Mood normal.        Behavior: Behavior normal.        Thought Content: Thought content normal.        Judgment: Judgment normal.         Assessment And Plan:    1. Encounter for annual physical exam Behavior modifications discussed and diet history reviewed.   Pt will continue to exercise regularly and modify diet with low GI, plant based foods and decrease intake of processed foods.  Recommend intake of daily multivitamin, Vitamin D, and calcium.  Recommend colonoscopy for preventive screenings, as well as recommend immunizations that include influenza, TDAP, and Shingles (up to date)  2. Essential hypertension Comments:  Blood pressure is well controlled, continue current medications. EKG done with Incomplete right bundle branch block and right axis -possible right ventricular hypertrophy. Has had these results in the past and had been followed by Dr. Cathie Olden - POCT Urinalysis Dipstick (81002) - Microalbumin / creatinine urine ratio - EKG 12-Lead  3. Type 2 diabetes mellitus with stage 3a chronic kidney disease, with long-term current use of insulin (HCC) Comments: Will check urinalysis and micro  4. Cervicalgia Comments: Continues to have pain to his neck will refer to Orthopedics. Xray showed mild degenerative changes. - Ambulatory referral to Orthopedic Surgery  5. Elevated liver function tests Comments: Will recheck liver functions slightly improved at last visit. - Liver Profile  6. Class 1 obesity due to excess calories with serious comorbidity and body mass index (BMI) of 33.0 to 33.9 in adult He is encouraged to strive for BMI less than 30 to decrease cardiac risk. Advised to aim for at least 150 minutes of exercise per week. Declines referral for PREP.   Patient was given opportunity to ask questions. Patient verbalized understanding of the plan and was able to repeat key elements of the plan. All questions were answered to their satisfaction.   [name], FNP   I, [name], FNP, have reviewed all documentation for this visit. The documentation on 05/04/22 for the exam, diagnosis, procedures, and orders are all accurate and complete.  THE PATIENT IS ENCOURAGED TO PRACTICE SOCIAL DISTANCING DUE TO THE COVID-19 PANDEMIC.

## 2022-05-04 NOTE — Patient Instructions (Signed)
Health Maintenance, Male Adopting a healthy lifestyle and getting preventive care are important in promoting health and wellness. Ask your health care provider about: The right schedule for you to have regular tests and exams. Things you can do on your own to prevent diseases and keep yourself healthy. What should I know about diet, weight, and exercise? Eat a healthy diet  Eat a diet that includes plenty of vegetables, fruits, low-fat dairy products, and lean protein. Do not eat a lot of foods that are high in solid fats, added sugars, or sodium. Maintain a healthy weight Body mass index (BMI) is a measurement that can be used to identify possible weight problems. It estimates body fat based on height and weight. Your health care provider can help determine your BMI and help you achieve or maintain a healthy weight. Get regular exercise Get regular exercise. This is one of the most important things you can do for your health. Most adults should: Exercise for at least 150 minutes each week. The exercise should increase your heart rate and make you sweat (moderate-intensity exercise). Do strengthening exercises at least twice a week. This is in addition to the moderate-intensity exercise. Spend less time sitting. Even light physical activity can be beneficial. Watch cholesterol and blood lipids Have your blood tested for lipids and cholesterol at 69 years of age, then have this test every 5 years. You may need to have your cholesterol levels checked more often if: Your lipid or cholesterol levels are high. You are older than 69 years of age. You are at high risk for heart disease. What should I know about cancer screening? Many types of cancers can be detected early and may often be prevented. Depending on your health history and family history, you may need to have cancer screening at various ages. This may include screening for: Colorectal cancer. Prostate cancer. Skin cancer. Lung  cancer. What should I know about heart disease, diabetes, and high blood pressure? Blood pressure and heart disease High blood pressure causes heart disease and increases the risk of stroke. This is more likely to develop in people who have high blood pressure readings or are overweight. Talk with your health care provider about your target blood pressure readings. Have your blood pressure checked: Every 3-5 years if you are 18-39 years of age. Every year if you are 40 years old or older. If you are between the ages of 65 and 75 and are a current or former smoker, ask your health care provider if you should have a one-time screening for abdominal aortic aneurysm (AAA). Diabetes Have regular diabetes screenings. This checks your fasting blood sugar level. Have the screening done: Once every three years after age 45 if you are at a normal weight and have a low risk for diabetes. More often and at a younger age if you are overweight or have a high risk for diabetes. What should I know about preventing infection? Hepatitis B If you have a higher risk for hepatitis B, you should be screened for this virus. Talk with your health care provider to find out if you are at risk for hepatitis B infection. Hepatitis C Blood testing is recommended for: Everyone born from 1945 through 1965. Anyone with known risk factors for hepatitis C. Sexually transmitted infections (STIs) You should be screened each year for STIs, including gonorrhea and chlamydia, if: You are sexually active and are younger than 69 years of age. You are older than 69 years of age and your   health care provider tells you that you are at risk for this type of infection. Your sexual activity has changed since you were last screened, and you are at increased risk for chlamydia or gonorrhea. Ask your health care provider if you are at risk. Ask your health care provider about whether you are at high risk for HIV. Your health care provider  may recommend a prescription medicine to help prevent HIV infection. If you choose to take medicine to prevent HIV, you should first get tested for HIV. You should then be tested every 3 months for as long as you are taking the medicine. Follow these instructions at home: Alcohol use Do not drink alcohol if your health care provider tells you not to drink. If you drink alcohol: Limit how much you have to 0-2 drinks a day. Know how much alcohol is in your drink. In the [place] one drink equals one 12 oz bottle of beer (355 mL), one 5 oz glass of wine (148 mL), or one 1 oz glass of hard liquor (44 mL). Lifestyle Do not use any products that contain nicotine or tobacco. These products include cigarettes, chewing tobacco, and vaping devices, such as e-cigarettes. If you need help quitting, ask your health care provider. Do not use street drugs. Do not share needles. Ask your health care provider for help if you need support or information about quitting drugs. General instructions Schedule regular health, dental, and eye exams. Stay current with your vaccines. Tell your health care provider if: You often feel depressed. You have ever been abused or do not feel safe at home. Summary Adopting a healthy lifestyle and getting preventive care are important in promoting health and wellness. Follow your health care provider's instructions about healthy diet, exercising, and getting tested or screened for diseases. Follow your health care provider's instructions on monitoring your cholesterol and blood pressure. This information is not intended to replace advice given to you by your health care provider. Make sure you discuss any questions you have with your health care provider. Document Revised: 09/08/2020 Document Reviewed: 09/08/2020 Elsevier Patient Education  2023 Elsevier Inc.  

## 2022-05-05 ENCOUNTER — Telehealth: Payer: Medicare Other

## 2022-05-05 ENCOUNTER — Other Ambulatory Visit: Payer: Self-pay | Admitting: Nurse Practitioner

## 2022-05-05 ENCOUNTER — Telehealth: Payer: Self-pay

## 2022-05-05 ENCOUNTER — Ambulatory Visit: Payer: BLUE CROSS/BLUE SHIELD

## 2022-05-05 DIAGNOSIS — N1831 Chronic kidney disease, stage 3a: Secondary | ICD-10-CM

## 2022-05-05 DIAGNOSIS — I10 Essential (primary) hypertension: Secondary | ICD-10-CM

## 2022-05-05 LAB — POCT URINALYSIS DIPSTICK
Bilirubin, UA: NEGATIVE
Blood, UA: NEGATIVE
Glucose, UA: POSITIVE — AB
Ketones, UA: NEGATIVE
Leukocytes, UA: NEGATIVE
Nitrite, UA: NEGATIVE
Protein, UA: NEGATIVE
Spec Grav, UA: 1.02 (ref 1.010–1.025)
Urobilinogen, UA: 0.2 E.U./dL
pH, UA: 6.5 (ref 5.0–8.0)

## 2022-05-05 LAB — HEPATIC FUNCTION PANEL
ALT: 32 IU/L (ref 0–44)
AST: 28 IU/L (ref 0–40)
Albumin: 4.9 g/dL (ref 3.9–4.9)
Alkaline Phosphatase: 145 IU/L — ABNORMAL HIGH (ref 44–121)
Bilirubin Total: 0.7 mg/dL (ref 0.0–1.2)
Bilirubin, Direct: 0.22 mg/dL (ref 0.00–0.40)
Total Protein: 7 g/dL (ref 6.0–8.5)

## 2022-05-05 LAB — MICROALBUMIN / CREATININE URINE RATIO
Creatinine, Urine: 93.3 mg/dL
Microalb/Creat Ratio: 40 mg/g creat — ABNORMAL HIGH (ref 0–29)
Microalbumin, Urine: 37.7 ug/mL

## 2022-05-05 NOTE — Progress Notes (Signed)
Care Management & Coordination Services Pharmacy Note  05/05/2022 Name:  Robert Walters MRN:  [id number] DOB:  1956/04/02  Summary: Patient reports that he is doing well. He has not had any issues with his medications and is taking them daily. He did receive the approval notices for his medications.   Recommendations/Changes made from today's visit: Discuss with patient the benefits of Ozempic and possibly changing medication    Follow up plan: At this time, Robert Walters would like to continue his Rybelsus he does not want to change his medications.  Robert Walters is going to start drinking 5 to 6 bottles of water per day.    Subjective: Robert Walters is an 67 y.o. year old male who is a primary patient of [name], [place].  The care coordination team was consulted for assistance with disease management and care coordination needs.    Engaged with patient by telephone for follow up visit.  Patient Care Team: [name], FNP as PCP - General (General Practice) Nahser, Wonda Cheng, MD as PCP - Cardiology (Cardiology)  Recent office visits: 05/04/2022 PCP OV   Recent consult visits: 02/10/2022 Cardiology [facility] visits: None in previous 6 months   Objective:  Lab Results  Component Value Date   CREATININE 1.54 (H) 04/05/2022   BUN 22 04/05/2022   EGFR 49 (L) 04/05/2022   GFRNONAA 50 (L) 03/26/2020   GFRAA 58 (L) 03/26/2020   NA 138 04/05/2022   K 4.4 04/05/2022   CALCIUM 9.9 04/05/2022   CO2 19 (L) 04/05/2022   GLUCOSE 177 (H) 04/05/2022    Lab Results  Component Value Date/Time   HGBA1C 6.7 (H) 04/05/2022 10:41 AM   HGBA1C 6.9 (H) 11/16/2021 11:45 AM   MICROALBUR 10 04/14/2021 11:45 AM   MICROALBUR 10 03/26/2020 01:16 PM    Last diabetic Eye exam:  Lab Results  Component Value Date/Time   HMDIABEYEEXA No Retinopathy 10/20/2021 12:00 AM    Last diabetic Foot exam: No results found for: "HMDIABFOOTEX"   Lab Results  Component Value Date   CHOL 146  04/05/2022   HDL 41 04/05/2022   LDLCALC 53 04/05/2022   LDLDIRECT 124.3 11/10/2010   TRIG 340 (H) 04/05/2022   CHOLHDL 3.6 04/05/2022       Latest Ref Rng & Units 05/04/2022    3:26 PM 11/16/2021   11:45 AM 07/13/2021   12:21 PM  Hepatic Function  Total Protein 6.0 - 8.5 g/dL 7.0  6.6  6.7   Albumin 3.9 - 4.9 g/dL 4.9  4.3  4.5   AST 0 - 40 IU/L 28  37  37   ALT 0 - 44 IU/L 32  55  55   Alk Phosphatase 44 - 121 IU/L 145  155  172   Total Bilirubin 0.0 - 1.2 mg/dL 0.7  0.9  0.6   Bilirubin, Direct 0.00 - 0.40 mg/dL 0.22       Lab Results  Component Value Date/Time   TSH 2.230 07/13/2021 12:21 PM   TSH 1.650 06/30/2020 03:50 PM   FREET4 1.69 03/24/2009 01:00 PM       Latest Ref Rng & Units 04/14/2021   10:49 AM 03/26/2020   11:09 AM 11/29/2018   10:46 AM  CBC  WBC 3.4 - 10.8 x10E3/uL 8.7  6.9  7.0   Hemoglobin 13.0 - 17.7 g/dL 14.6  13.5  12.8   Hematocrit 37.5 - 51.0 % 43.4  40.2  37.6  Platelets 150 - 450 x10E3/uL 157  178  161     Lab Results  Component Value Date/Time   VITAMINB12 320 03/24/2009 01:00 PM    Clinical ASCVD: Yes  The ASCVD Risk score (Arnett DK, et al., 2019) failed to calculate for the following reasons:   The patient has a prior MI or stroke diagnosis       05/04/2022    2:31 PM 04/22/2022    2:35 PM 04/05/2022    9:29 AM  Depression screen PHQ 2/9  Decreased Interest 0 0 0  Down, Depressed, Hopeless 0 0 0  PHQ - 2 Score 0 0 0  Altered sleeping 0  0  Tired, decreased energy 0  0  Change in appetite 0  0  Feeling bad or failure about yourself  0  0  Trouble concentrating 0  0  Moving slowly or fidgety/restless 0  0  Suicidal thoughts 0  0  PHQ-9 Score 0  0     Social History   Tobacco Use  Smoking Status Never  Smokeless Tobacco Never   BP Readings from Last 3 Encounters:  05/04/22 128/62  04/05/22 122/68  04/01/22 136/84   Pulse Readings from Last 3 Encounters:  05/04/22 99  04/05/22 97  04/01/22 (!) 111   Wt Readings  from Last 3 Encounters:  05/04/22 226 lb 9.6 oz (102.8 kg)  04/22/22 229 lb (103.9 kg)  04/05/22 227 lb (103 kg)   BMI Readings from Last 3 Encounters:  05/04/22 33.46 kg/m  04/22/22 33.82 kg/m  04/05/22 33.52 kg/m    No Known Allergies  Medications Reviewed Today     Reviewed by [name], FNP (Family Nurse Practitioner) on 05/04/22 at 49  Med List Status: <None>   Medication Order Taking? Sig Documenting Provider Last Dose Status Informant  Ascorbic Acid (VITAMIN C) 500 MG CAPS [id number] No Take 500 mg by mouth daily.  [provider] Taking Active Self  aspirin EC 81 MG tablet [id number] No Take 1 tablet (81 mg total) by mouth daily. Nahser, Wonda Cheng, MD Taking Active Self  atorvastatin (LIPITOR) 80 MG tablet [id number] No TAKE 1 TABLET BY MOUTH DAILY [name], FNP Taking Active   b complex vitamins tablet [id number] No Take 1 tablet by mouth daily.  [provider] Taking Active Self  cholecalciferol (VITAMIN D3) 25 MCG (1000 UNIT) tablet [id number] No Take 2,000 Units by mouth daily. [provider] Taking Active Self  CINNAMON PO [id number] No Take 2,000 mg by mouth daily. [provider] Taking Active Self  ciprofloxacin (CIPRO) tablet 500 mg [id number]   Irine Seal, MD  Active   Coenzyme Q10 (COQ10) 200 MG CAPS [id number] No Take 200 mg by mouth daily. [provider] Taking Active Self  empagliflozin (JARDIANCE) 25 MG TABS tablet [id number] No Take 1 tablet (25 mg total) by mouth daily before breakfast. [name], FNP Taking Active   ezetimibe (ZETIA) 10 MG tablet [id number] No TAKE 1 TABLET BY MOUTH DAILY GENERIC EQUIVALENT FOR Alfonse Spruce, MD Taking Active   FLUoxetine (PROZAC) 40 MG capsule [id number]  TAKE ONE CAPSULE BY MOUTH EVERY MORNING Alen Blew  Active   Garlic 793 MG CAPS [id number] No Take 500 mg by mouth daily. [provider] Taking Active Self  glucose blood (ONETOUCH ULTRA) test  strip [id number] No USE 1 STRIP TO CHECK GLUCOSE 3 TIMES DAILY [name], FNP Taking Active   insulin degludec (TRESIBA FLEXTOUCH) 100 UNIT/ML  FlexTouch Pen [id number] No Inject 55 Units into the skin daily. [name], FNP Taking Active   Insulin Pen Needle (PEN NEEDLES) 32G X 4 MM MISC [id number] No Use as directed [name], FNP Taking Active   lamoTRIgine (LAMICTAL) 150 MG tablet [id number] No Take 1 tablet (150 mg total) by mouth 2 (two) times daily. Addison Lank, PA-C Taking Active   LORazepam (ATIVAN) 1 MG tablet [id number] No TAKE 1 TABLET BY MOUTH THREE TIMES DAILY AS NEEDED FOR ANXIETY. Donnal Moat T, PA-C Taking Active   Menthol-Methyl Salicylate (ARTHRITIS HOT) 10-15 % CREA [id number] No Apply 1 application  topically daily as needed (pain). [provider] Taking Active Self  Menthol-Methyl Salicylate (ICY HOT) 25-85 % STCK [id number] No Apply 1 application topically 3 (three) times daily as needed (pain). [provider] Taking Active Self  metFORMIN (GLUCOPHAGE) 1000 MG tablet [id number] No TAKE 1 TABLET BY MOUTH TWICE DAILY WITH MEALS [name], FNP Taking Active   metoprolol tartrate (LOPRESSOR) 25 MG tablet [id number] No TAKE 1 TABLET BY MOUTH 2 TIMES DAILY GENERIC EQUIVALENT FOR LOPRESSOR. Nahser, Wonda Cheng, MD Taking Active   Multiple Vitamin (MULTIVITAMIN WITH MINERALS) TABS tablet [id number] No Take 1 tablet by mouth daily. [provider] Taking Active Self  Omega-3 Fatty Acids (FISH OIL) 1000 MG CAPS [id number] No Take 2,000 mg by mouth daily. [provider] Taking Active Self  omeprazole (PRILOSEC) 40 MG capsule [id number] No TAKE ONE CAPSULE BY MOUTH ONCE DAILY. Montez Morita, Quillian Quince, MD Taking Active   Semaglutide [facility]) 14 MG TABS [id number] No Take 1 tablet (14 mg total) by mouth daily at 6 (six) AM. [name], FNP Taking Active   tadalafil (CIALIS) 5 MG tablet [id number] No Take 1 tablet (5 mg total) by mouth daily as  needed for erectile dysfunction. Irine Seal, MD Taking Active   valsartan (DIOVAN) 320 MG tablet [id number] No Take 1 tablet (320 mg total) by mouth daily. Nahser, Wonda Cheng, MD Taking Active   Vibegron 75 MG TABS [id number] No Take 75 mg by mouth daily. Irine Seal, MD Taking Active             Patient Active Problem List   Diagnosis Date Noted   NASH (nonalcoholic steatohepatitis) 11/20/2020   Dysphagia 11/20/2020   Chronic diastolic heart failure ([place]) 03/27/2019   Fatty liver 12/26/2018   Elevated LFTs 12/26/2018   History of colonic polyps 12/26/2018   Fall 08/28/2018   Rib pain on left side 08/28/2018   Anxiety 03/31/2018   Bipolar II disorder ([place]) 03/31/2018   Insomnia 03/31/2018   Type 2 diabetes mellitus without complication, without long-term current use of insulin ([place]) 03/24/2018   Bile leak 09/15/2016   Sepsis ([place]) 08/23/2016   Cholecystitis    Stroke ([place]) 07/25/2015   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 07/25/2015   Dizziness 08/09/2014   TIA (transient ischemic attack) 05/15/2012   Blurred vision 05/15/2012   Ataxia 05/15/2012   Diabetes mellitus type 2 with complications ([place]) 92/03/9416   BPH (benign prostatic hyperplasia) 05/15/2012   GERD (gastroesophageal reflux disease) 05/15/2012   Obesity 05/15/2012   Bipolar 1 disorder ([place]) 05/15/2012   Essential hypertension 10/30/2010   Cardiovascular risk factor 10/30/2010   Pure hypercholesterolemia 12/05/2008    Immunization History  Administered Date(s) Administered   DTaP 10/05/2011   Influenza, High Dose Seasonal PF 01/15/2019, 12/31/2020   Influenza-Unspecified 03/16/2018, 03/21/2020, 03/15/2022   MODERNA COVID-19 SARS-COV-2 PEDS BIVALENT BOOSTER 6Y-11Y 04/01/2022   Moderna Covid-19  Vaccine Bivalent Booster 76yr & up 05/22/2021   Moderna Sars-Covid-2 Vaccination 12/07/2019, 01/04/2020, 07/18/2020, 12/05/2020   Pneumococcal Conjugate-13 03/21/2019   Pneumococcal Polysaccharide-23 05/16/2012, 03/21/2020    Tdap 10/01/2020   Zoster Recombinat (Shingrix) 03/21/2019, 11/19/2019     Compliance/Adherence/Medication fill history: Care Gaps: None noted  Star-Rating Drugs: Atorvastatin 80 mg tablet   SDOH:  (Social Determinants of Health) assessments and interventions performed: No SDOH Interventions    Flowsheet Row Clinical Support from 04/22/2022 in THazelManagement from 01/14/2020 in TBuhlVisit from 11/29/2018 in TBolingInterventions     Food Insecurity Interventions Intervention Not Indicated -- --  Transportation Interventions Intervention Not Indicated -- --  Depression Interventions/Treatment  -- -- Currently on Treatment  Financial Strain Interventions Intervention Not Indicated Other (Comment)  [Assisted with patient assistance applications for Tresiba, Jardiance, and Rybelsus] --  Physical Activity Interventions Patient Refused, Other (Comments) -- --  Stress Interventions Intervention Not Indicated -- --      SDOH Screenings   Food Insecurity: No Food Insecurity (04/22/2022)  Housing: Low Risk  (07/16/2019)  Transportation Needs: No Transportation Needs (04/22/2022)  Depression (PHQ2-9): Low Risk  (05/04/2022)  Financial Resource Strain: Low Risk  (04/22/2022)  Physical Activity: Inactive (04/22/2022)  Stress: No Stress Concern Present (04/22/2022)  Tobacco Use: Low Risk  (05/04/2022)    Medication Assistance:  Rybelsus, Jardiance, Tresiba obtained through [place]   medication assistance program.  Enrollment ends 04/2023  Medication Access: Within the past 30 days, how often has patient missed a dose of medication? None Is a pillbox or other method used to improve adherence? Yes  Factors that may affect medication adherence? financial need and lack of understanding of disease management Are meds synced by current pharmacy? No  Are meds delivered by  current pharmacy? No  Does patient experience delays in picking up medications due to transportation concerns? No   [facility] Services Reviewed: Is patient disadvantaged to use [facility]?: No  Current Rx insurance plan: BPetronilaName and location of Current pharmacy:  [address]up[facility] [address] [place] [address]Phone: [phone]: [address] [address] [address]Phone: [phone]: [phone] [address] [place] [address] [place]Phone: [phone]: [phone] [id number] [place], [address] [address]: [phone]: [phone] Pharmacy services reviewed with patient today?: Yes  Patient requests to transfer care to [facility] Pharmacy?: No  Reason patient declined to change pharmacies: Loyalty to other pharmacy/Patient preference   Assessment/Plan   Diabetes (A1c goal <8%) -Controlled -Current medications: Jardiance 25 mg tablet once per day Appropriate, Effective, Safe, Accessible Rybelsus 14 mg tablet once per day Appropriate, Effective, Safe, Accessible Tresiba 100 unit/nl - 55 units daily Appropriate, Effective, Safe, Accessible  -Current home glucose readings fasting glucose: 134 - 175 -Denies hypoglycemic/hyperglycemic symptoms -Current meal patterns: he has cut back quite a bit, he did have two burgers for lunch yesterday but he reports rarely eating large meals anymore lunch: 2 burgers   drinks: he had coffee- no sugar, and a diet drink -Current exercise: he walks some, but he is going to  walk more.  -Educated on A1c and blood sugar goals; Benefits of routine self-monitoring of blood sugar; -Counseled to check feet daily  and get yearly eye exams -Discussed the possibility of changing Rybelsus to Ozempic per patient he is not comfortable with changing his medications.  -Recommended to continue current medication  [name], CPP, PharmD Clinical Pharmacist Practitioner [facility] [phone]

## 2022-05-05 NOTE — Telephone Encounter (Signed)
Patient call today for urodynamic results, made patient aware that once the MD review over the results. Made patient  FU appt 05/20/22. Someone will reach back out to him with the results. Patient voiced understanding

## 2022-05-06 DIAGNOSIS — K080 Exfoliation of teeth due to systemic causes: Secondary | ICD-10-CM | POA: Diagnosis not present

## 2022-05-07 ENCOUNTER — Telehealth: Payer: Self-pay

## 2022-05-07 NOTE — Progress Notes (Cosign Needed)
Faxed 2024 re-enrollment application to [employer] for [place] and Novofine needles and for Rybelsus.   Pattricia Boss, [place] Pharmacist Assistant [phone]

## 2022-05-11 ENCOUNTER — Telehealth: Payer: Self-pay | Admitting: Orthopaedic Surgery

## 2022-05-11 ENCOUNTER — Encounter: Payer: Self-pay | Admitting: Orthopaedic Surgery

## 2022-05-11 ENCOUNTER — Ambulatory Visit: Payer: BLUE CROSS/BLUE SHIELD | Admitting: Orthopaedic Surgery

## 2022-05-11 VITALS — BP 123/79 | HR 90 | Ht 69.0 in | Wt 225.0 lb

## 2022-05-11 DIAGNOSIS — G8929 Other chronic pain: Secondary | ICD-10-CM | POA: Diagnosis not present

## 2022-05-11 DIAGNOSIS — M542 Cervicalgia: Secondary | ICD-10-CM

## 2022-05-11 NOTE — Telephone Encounter (Signed)
Robert Walters w/[facility] [phone]) - pt has a MRI on 05/13/22, ins requires prior auth and our office needs to obtain that.  CPT [id number], prior autho needs to go through [phone].  They will need their NPI # [id number].  Stated the pt has a new ID #, which is [id number] Grp #  [id number].

## 2022-05-11 NOTE — Patient Instructions (Addendum)
 You have chosen to have your imaging done at [facility]. You will need to call them and schedule your appointment. We will work on the approval. You MUST bring a CD and Report with you to your follow up to review imaging. You can pick up a CD with the images Monday - Friday 8-5pm. They will not burn the CD until you are there so you don't need call before you go to pick it up. You can pick up a CD with a copy of the images on it Monday-Friday between 8am and 5pm. They will not burn the CD until you are there to pick it up so you do not have to call to ask them for one.   [place] [address]  [address] PHONE: [phone]   Dr.Keeling is here all day on Tuesdays. He is here half a day on Wednesday mornings, and Thursday mornings. If you need anything such as a medication refill, please either call BEFORE the end of the day on [facility] or send a message through [place]. Your pharmacy can send a refill request for you. Calling by the end of the day on [facility] allows [place] time to send Dr.Keeling the request and for him to respond before he leaves on Thursdays.   My name is  and I assist Ang[place]. If you need anything before your next appointment, please do not hesitate to call the office at [phone] and ask to leave a message for me. I will respond within 24-48 business hours.

## 2022-05-11 NOTE — Progress Notes (Signed)
Subjective:    Patient ID: Robert Walters, male    DOB: January 08, 1956, 67 y.o.   MRN: [id number]  HPI He has had neck pain for over nine months and not getting better.  He has been to PT at [facility] in [place] and [facility] for his neck over this time period.  He has no numbness.  He has difficulty in moving his neck more to the right than the left.  He has no trauma.  He had X-rays recently of the neck which showed: IMPRESSION: Mild degenerative joint changes of cervical spine.  I have reviewed his notes from his primary care and the X-rays.  I have independently reviewed and interpreted x-rays of this patient done at another site by another physician or qualified health professional.  He has diabetes and cannot take prednisone.  He has GI problems and cannot take NSAIDs.  He has used heat and ice with no help.     Review of Systems  Constitutional:  Positive for activity change.  Musculoskeletal:  Positive for arthralgias, myalgias, neck pain and neck stiffness.  All other systems reviewed and are negative. For Review of Systems, all other systems reviewed and are negative.  The following is a summary of the past history medically, past history surgically, known current medicines, social history and family history.  This information is gathered electronically by the computer from prior information and documentation.  I review this each visit and have found including this information at this point in the chart is beneficial and informative.   Past Medical History:  Diagnosis Date   Acid reflux disease    Bipolar 1 disorder, mixed (HCC)    BPH (benign prostatic hypertrophy)    CVA (cerebral vascular accident) ([place]) 07/2015   Gastritis    H/O hiatal hernia    Hyperlipidemia    Hypertension    Normal nuclear stress test Dec 2011   Obesity    TIA (transient ischemic attack) 05/15/2012   "they think I've had one this am @ 0130" (05/15/2012)   Type II diabetes mellitus ([place])    Vertigo      Past Surgical History:  Procedure Laterality Date   BILIARY STENT PLACEMENT N/A 09/17/2016   Procedure: BILIARY STENT PLACEMENT;  Surgeon: [name], MD;  [facility];  Service: Endoscopy;  Laterality: N/A;   CARDIOVASCULAR STRESS TEST  2009   EF 63%   CHOLECYSTECTOMY N/A 08/25/2016   Procedure: LAPAROSCOPIC CHOLECYSTECTOMY;  Surgeon: [name], MD;  Location: [facility];  Service: General;  Laterality: N/A;   COLONOSCOPY     COLONOSCOPY WITH PROPOFOL N/A 02/03/2021   Procedure: COLONOSCOPY WITH PROPOFOL;  Surgeon: Harvel Quale, MD;  [facility];  Service: Gastroenterology;  Laterality: N/A;    incomplete colonoscopy poor prep   COLONOSCOPY WITH PROPOFOL N/A 02/04/2021   Procedure: COLONOSCOPY WITH PROPOFOL;  Surgeon: Harvel Quale, MD;  [facility];  Service: Gastroenterology;  Laterality: N/A;  1:45   ERCP N/A 09/17/2016   Procedure: ENDOSCOPIC RETROGRADE CHOLANGIOPANCREATOGRAPHY (ERCP);  Surgeon: [name], MD;  [facility];  Service: Endoscopy;  Laterality: N/A;  730   ERCP N/A 12/03/2016   Procedure: ENDOSCOPIC RETROGRADE CHOLANGIOPANCREATOGRAPHY (ERCP);  Surgeon: [name], MD;  [facility];  Service: Gastroenterology;  Laterality: N/A;   ESOPHAGEAL DILATION  02/03/2021   Procedure: ESOPHAGEAL DILATION;  Surgeon: Harvel Quale, MD;  [facility];  Service: Gastroenterology;;  savory 16 and 18 fr   ESOPHAGOGASTRODUODENOSCOPY     ESOPHAGOGASTRODUODENOSCOPY (EGD) WITH PROPOFOL N/A 02/03/2021   Procedure: ESOPHAGOGASTRODUODENOSCOPY (EGD) WITH PROPOFOL;  Surgeon: Harvel Quale, MD;  [facility];  Service: Gastroenterology;  Laterality: N/A;   GASTROINTESTINAL STENT REMOVAL N/A 12/03/2016   Procedure: BILIARY STENT REMOVAL;  Surgeon: [name], MD;  [facility];  Service: Gastroenterology;  Laterality: N/A;   NO PAST SURGERIES      POLYPECTOMY  02/04/2021   Procedure: POLYPECTOMY INTESTINAL;  Surgeon: Harvel Quale, MD;  [facility];  Service: Gastroenterology;;   SPHINCTEROTOMY N/A 09/17/2016   Procedure: Joan Mayans;  Surgeon: [name], MD;  [facility];  Service: Endoscopy;  Laterality: N/A;    Current Outpatient Medications on File Prior to Visit  Medication Sig Dispense Refill   Ascorbic Acid (VITAMIN C) 500 MG CAPS Take 500 mg by mouth daily.      aspirin EC 81 MG tablet Take 1 tablet (81 mg total) by mouth daily.     atorvastatin (LIPITOR) 80 MG tablet TAKE 1 TABLET BY MOUTH DAILY 90 tablet 1   b complex vitamins tablet Take 1 tablet by mouth daily.      cholecalciferol (VITAMIN D3) 25 MCG (1000 UNIT) tablet Take 2,000 Units by mouth daily.     CINNAMON PO Take 2,000 mg by mouth daily.     Coenzyme Q10 (COQ10) 200 MG CAPS Take 200 mg by mouth daily.     empagliflozin (JARDIANCE) 25 MG TABS tablet Take 1 tablet (25 mg total) by mouth daily before breakfast. 30 tablet 2   ezetimibe (ZETIA) 10 MG tablet TAKE 1 TABLET BY MOUTH DAILY GENERIC EQUIVALENT FOR ZETIA 90 tablet 3   FLUoxetine (PROZAC) 40 MG capsule TAKE ONE CAPSULE BY MOUTH EVERY MORNING 90 capsule 0   Garlic 245 MG CAPS Take 500 mg by mouth daily.     glucose blood (ONETOUCH ULTRA) test strip USE 1 STRIP TO CHECK GLUCOSE 3 TIMES DAILY 300 strip 3   insulin degludec (TRESIBA FLEXTOUCH) 100 UNIT/ML FlexTouch Pen Inject 55 Units into the skin daily. 15 mL 1   Insulin Pen Needle (PEN NEEDLES) 32G X 4 MM MISC Use as directed 100 each 2   lamoTRIgine (LAMICTAL) 150 MG tablet Take 1 tablet (150 mg total) by mouth 2 (two) times daily. 180 tablet 1   LORazepam (ATIVAN) 1 MG tablet TAKE 1 TABLET BY MOUTH THREE TIMES DAILY AS NEEDED FOR ANXIETY. 270 tablet 0   Menthol-Methyl Salicylate (ARTHRITIS HOT) 10-15 % CREA Apply 1 application  topically daily as needed (pain).     Menthol-Methyl Salicylate (ICY HOT) 80-99 %  STCK Apply 1 application topically 3 (three) times daily as needed (pain).     metFORMIN (GLUCOPHAGE) 1000 MG tablet TAKE 1 TABLET BY MOUTH TWICE DAILY WITH MEALS 180 tablet 1   metoprolol tartrate (LOPRESSOR) 25 MG tablet TAKE 1 TABLET BY MOUTH 2 TIMES DAILY GENERIC EQUIVALENT FOR LOPRESSOR. 180 tablet 3   Multiple Vitamin (MULTIVITAMIN WITH MINERALS) TABS tablet Take 1 tablet by mouth daily.     Omega-3 Fatty Acids (FISH OIL) 1000 MG CAPS Take 2,000 mg by mouth daily.     omeprazole (PRILOSEC) 40 MG capsule TAKE ONE CAPSULE BY MOUTH ONCE DAILY. 90 capsule 0   Semaglutide (RYBELSUS) 14 MG TABS Take 1 tablet (14 mg total) by mouth daily at 6 (six) AM. 90 tablet 0   tadalafil (CIALIS) 5 MG  tablet Take 1 tablet (5 mg total) by mouth daily as needed for erectile dysfunction. 30 tablet 11   valsartan (DIOVAN) 320 MG tablet Take 1 tablet (320 mg total) by mouth daily. 90 tablet 3   Vibegron 75 MG TABS Take 75 mg by mouth daily. 42 tablet 0   Current Facility-Administered Medications on File Prior to Visit  Medication Dose Route Frequency Provider Last Rate Last Admin   ciprofloxacin (CIPRO) tablet 500 mg  500 mg Oral Once Irine Seal, MD        Social History   Socioeconomic History   Marital status: Widowed    Spouse name: Not on file   Number of children: Not on file   Years of education: Not on file   Highest education level: Not on file  Occupational History   Occupation: disabled  Tobacco Use   Smoking status: Never   Smokeless tobacco: Never  Vaping Use   Vaping Use: Never used  Substance and Sexual Activity   Alcohol use: No   Drug use: No   Sexual activity: Not Currently  Other Topics Concern   Not on file  Social History Narrative   Not on file   Social Determinants of Health   Financial Resource Strain: Low Risk  (04/22/2022)   Overall Financial Resource Strain (CARDIA)    Difficulty of Paying Living Expenses: Not hard at all  Food Insecurity: No Food Insecurity  (04/22/2022)   Hunger Vital Sign    Worried About Running Out of Food in the Last Year: Never true    [place] in the Last Year: Never true  Transportation Needs: No Transportation Needs (04/22/2022)   PRAPARE - Hydrologist (Medical): No    Lack of Transportation (Non-Medical): No  Physical Activity: Inactive (04/22/2022)   Exercise Vital Sign    Days of Exercise per Week: 0 days    Minutes of Exercise per Session: 0 min  Stress: No Stress Concern Present (04/22/2022)   [place]    Feeling of Stress : Not at all  Social Connections: Not on file  Intimate Partner Violence: Not At Risk (03/21/2019)   Humiliation, Afraid, Rape, and Kick questionnaire    Fear of Current or Ex-Partner: No    Emotionally Abused: No    Physically Abused: No    Sexually Abused: No    Family History  Problem Relation Age of Onset   Hyperlipidemia Father        in a nursing home   Colon cancer Father    Stroke Mother 48    BP 123/79   Pulse 90   Ht '5\' 9"'$  (1.753 m)   Wt 225 lb (102.1 kg)   BMI 33.23 kg/m   Body mass index is 33.23 kg/m.      Objective:   Physical Exam Vitals and nursing note reviewed. Exam conducted with a chaperone present.  Constitutional:      Appearance: He is well-developed.  HENT:     Head: Normocephalic and atraumatic.  Eyes:     Conjunctiva/sclera: Conjunctivae normal.     Pupils: Pupils are equal, round, and reactive to light.  Cardiovascular:     Rate and Rhythm: Normal rate and regular rhythm.  Pulmonary:     Effort: Pulmonary effort is normal.  Abdominal:     Palpations: Abdomen is soft.  Musculoskeletal:       Arms:  Cervical back: Normal range of motion and neck supple.  Skin:    General: Skin is warm and dry.  Neurological:     Mental Status: He is alert and oriented to person, place, and time.     Cranial Nerves: No cranial nerve deficit.      Motor: No abnormal muscle tone.     Coordination: Coordination normal.     Deep Tendon Reflexes: Reflexes are normal and symmetric. Reflexes normal.  Psychiatric:        Behavior: Behavior normal.        Thought Content: Thought content normal.        Judgment: Judgment normal.           Assessment & Plan:   Encounter Diagnosis  Name Primary?   Neck pain, chronic Yes   I will get MRI of the cervical spine.  I will do at open unit as he is claustrophobic.  Return in three weeks.  Call if any problem.  Precautions discussed.  Electronically Signed Sanjuana Kava, MD 1/9/20249:27 AM

## 2022-05-12 NOTE — Telephone Encounter (Signed)
Imaging approved via [place]. Information faxed to [facility] ATTN: Elmyra Ricks

## 2022-05-13 DIAGNOSIS — M47812 Spondylosis without myelopathy or radiculopathy, cervical region: Secondary | ICD-10-CM | POA: Diagnosis not present

## 2022-05-13 DIAGNOSIS — M4802 Spinal stenosis, cervical region: Secondary | ICD-10-CM | POA: Diagnosis not present

## 2022-05-17 ENCOUNTER — Ambulatory Visit (INDEPENDENT_AMBULATORY_CARE_PROVIDER_SITE_OTHER): Payer: Medicare Other | Admitting: Gastroenterology

## 2022-05-19 ENCOUNTER — Telehealth: Payer: Self-pay

## 2022-05-19 ENCOUNTER — Encounter: Payer: Self-pay | Admitting: Orthopaedic Surgery

## 2022-05-19 ENCOUNTER — Ambulatory Visit: Payer: Medicare Other | Admitting: Orthopaedic Surgery

## 2022-05-19 ENCOUNTER — Telehealth: Payer: Self-pay | Admitting: Orthopaedic Surgery

## 2022-05-19 DIAGNOSIS — M47812 Spondylosis without myelopathy or radiculopathy, cervical region: Secondary | ICD-10-CM | POA: Diagnosis not present

## 2022-05-19 DIAGNOSIS — G8929 Other chronic pain: Secondary | ICD-10-CM

## 2022-05-19 MED ORDER — PREDNISONE 5 MG (21) PO TBPK: ORAL_TABLET | ORAL | 0 refills | Status: DC

## 2022-05-19 NOTE — Patient Instructions (Addendum)
YOU HAVE BEEN PRESCRIBED PREDNISONE BY MOUTH. THIS WILL RAISE YOUR BLOOD SUGAR SO WATCH YOUR BLOOD SUGARS.  ROV 2 WEEKS  Dr.Keeling is here all day on Tuesdays. He is here half a day on Wednesday mornings, and Thursday mornings. If you need anything such as a medication refill, please either call BEFORE the end of the day on [facility] or send a message through [place]. Your pharmacy can send a refill request for you. Calling by the end of the day on [facility] allows [place] time to send Dr.Keeling the request and for him to respond before he leaves on Thursdays.

## 2022-05-19 NOTE — Progress Notes (Signed)
  Chronic Care Management   Note  05/19/2022 Name: Robert Walters MRN: [id number] DOB: 01-01-1956  Robert Walters is a 67 y.o. year old male who is a primary care patient of [name], [place]. I reached out to Kathyrn Lass by phone today in response to a referral sent by Robert Walters's PCP.  Robert Walters was given information about Chronic Care Management services today including:  CCM service includes personalized support from designated clinical staff supervised by the physician, including individualized plan of care and coordination with other care providers 24/7 contact phone numbers for assistance for urgent and routine care needs. Service will only be billed when office clinical staff spend 20 minutes or more in a month to coordinate care. Only one practitioner may furnish and bill the service in a calendar month. The patient may stop CCM services at amy time (effective at the end of the month) by phone call to the office staff. The patient will be responsible for cost sharing (co-pay) or up to 20% of the service fee (after annual deductible is met)  Robert Walters  agreedto scheduling an appointment with the CCM RN Case Manager   Follow up plan: Patient agreed to scheduled appointment with RN Case Manager on 05/25/2022(date/time).   Robert Walters, [place], [place] [address] Direct Dial: [phone] [email or web address]

## 2022-05-19 NOTE — Progress Notes (Signed)
I still hurt.  He had MRI of cervical spine showing c3-4 mild right neuroforaminal stenosis and grade 1 anterolisthesis at c3-4 and C4-5 with no disc herniation.  I have explained the findings to him.  I have suggested he see Dr. Ernestina Patches for epidural but he would rather try oral medicine.  He is diabetic and has GI problems.  He is willing to try oral prednisone knowing it will elevate his blood sugars.  I will do this.  ROM of neck is painful and limited with no spasm.  NV intact.  Encounter Diagnoses  Name Primary?   Neck pain, chronic Yes   Cervical spine arthritis    Prednisone dose pack called in.  Return in two weeks.  Monitor blood sugars carefully.  Call if any problem.  Precautions discussed.  Electronically Signed Sanjuana Kava, MD 1/17/20248:35 AM

## 2022-05-19 NOTE — Telephone Encounter (Signed)
Patient lvm requesting to speak to someone about taking medications.  Pt's # [phone]

## 2022-05-20 ENCOUNTER — Telehealth: Payer: Self-pay

## 2022-05-20 ENCOUNTER — Ambulatory Visit: Payer: Medicare Other | Admitting: Urology

## 2022-05-20 ENCOUNTER — Telehealth: Payer: Medicare Other

## 2022-05-20 ENCOUNTER — Telehealth: Payer: Self-pay | Admitting: Orthopaedic Surgery

## 2022-05-20 VITALS — BP 116/74 | HR 112

## 2022-05-20 DIAGNOSIS — R351 Nocturia: Secondary | ICD-10-CM | POA: Diagnosis not present

## 2022-05-20 DIAGNOSIS — N5201 Erectile dysfunction due to arterial insufficiency: Secondary | ICD-10-CM | POA: Diagnosis not present

## 2022-05-20 DIAGNOSIS — N3941 Urge incontinence: Secondary | ICD-10-CM

## 2022-05-20 DIAGNOSIS — R339 Retention of urine, unspecified: Secondary | ICD-10-CM

## 2022-05-20 DIAGNOSIS — N138 Other obstructive and reflux uropathy: Secondary | ICD-10-CM

## 2022-05-20 DIAGNOSIS — N401 Enlarged prostate with lower urinary tract symptoms: Secondary | ICD-10-CM | POA: Diagnosis not present

## 2022-05-20 LAB — URINALYSIS, ROUTINE W REFLEX MICROSCOPIC
Bilirubin, UA: NEGATIVE
Ketones, UA: NEGATIVE
Nitrite, UA: NEGATIVE
Protein,UA: NEGATIVE
RBC, UA: NEGATIVE
Specific Gravity, UA: 1.015 (ref 1.005–1.030)
Urobilinogen, Ur: 0.2 mg/dL (ref 0.2–1.0)
pH, UA: 5 (ref 5.0–7.5)

## 2022-05-20 LAB — MICROSCOPIC EXAMINATION: Bacteria, UA: NONE SEEN

## 2022-05-20 LAB — BLADDER SCAN AMB NON-IMAGING: Scan Result: 67

## 2022-05-20 MED ORDER — SILDENAFIL CITRATE 50 MG PO TABS: 50.0000 mg | ORAL_TABLET | Freq: Every day | ORAL | 11 refills | Status: DC | PRN

## 2022-05-20 MED ORDER — TROSPIUM CHLORIDE 20 MG PO TABS: 20.0000 mg | ORAL_TABLET | Freq: Two times a day (BID) | ORAL | 11 refills | Status: DC

## 2022-05-20 NOTE — Progress Notes (Signed)
Subjective: 1. BPH with urinary obstruction   2. Urge incontinence   3. Nocturia   4. Erectile dysfunction due to arterial insufficiency   5. Incomplete bladder emptying      05/20/22: Robert returns today in f/u for his history fo ED and LUTS.   He had a tight BN on cystoscopy.  He  the urodynamics done.  His capacity was 631m with a 1st sens at 5249m  He had DI with UUI.  His PF of 1741mec with a pDet of 18cm/H20.  His PVR was 280m85m He was given Gemtesa samples at his last visit and that has improvement in the urgency and nocturia.  His UA has 6-10 WBC.   04/01/22: Robert returns today in f/u for voiding studies to assess his response to tadalafil for daily use.  He didn't have much response to the med for the erections or the LUTS.  His IPSS is 21 with nocturia x 4 and a weak stream.  He still has some UUI.  His PVR today was 31ml42m he had inadequate urine for a flowrate.  He had a CT AP in 2018 and the prostate at that time measured 25ml.1m repeat FR after cysto had a sawtooth flow curve with a PF of 14ml/s65m10/5/23: Robert Kathreen Cosier6 yo m64e who was last seen by Dr. DahlsteDiona Fanti0.  He has a history of BPH with BOO and ED.  He is has been off of the alfuzosin for the last year.  He has been on sildenafil in the past.  His last PSA was stable at 0.6 in 8/22.  He has CKD3a with a Cr of 1.57 on 11/16/21.  He is a diabetic and is on Jardiance as well as several other meds.  He had a renal US in [id number] that showed findings consistent with medical renal disease.    He returns today with progressive frequency and urgency with UUI over the past 7-8 months.  He has nocturia 4-5x.  He has a weak stream with intermittency and has a sensation of incomplete empty.   He has no neuropathy.   He has had a TIA.       ROS:  Review of Systems  Genitourinary:  Positive for frequency and urgency.  All other systems reviewed and are negative.   No Known Allergies  Past Medical History:   Diagnosis Date   Acid reflux disease    Bipolar 1 disorder, mixed (HCC)    BPH (benign prostatic hypertrophy)    CVA (cerebral vascular accident) (HCC) 03[id number]   Gastritis    H/O hiatal hernia    Hyperlipidemia    Hypertension    Normal nuclear stress test Dec 2011   Obesity    TIA (transient ischemic attack) 05/15/2012   "they think I've had one this am @ 0130" (05/15/2012)   Type II diabetes mellitus (HCC)   [place]     Past Surgical History:  Procedure Laterality Date   BILIARY STENT PLACEMENT N/A 09/17/2016   Procedure: BILIARY STENT PLACEMENT;  Surgeon: Rehman,Rogene HoustonLocation: [facility];  Service: Endoscopy;  Laterality: N/A;   CARDIOVASCULAR STRESS TEST  2009   EF 63%   CHOLECYSTECTOMY N/A 08/25/2016   Procedure: LAPAROSCOPIC CHOLECYSTECTOMY;  Surgeon: Mark JeAviva SignsLocation: [facility];  Service: General;  Laterality: N/A;   COLONOSCOPY     COLONOSCOPY WITH PROPOFOL N/A 02/03/2021   Procedure: COLONOSCOPY WITH PROPOFOL;  Surgeon: Montez Morita, Quillian Quince, MD;  Location: [facility];  Service: Gastroenterology;  Laterality: N/A;    incomplete colonoscopy poor prep   COLONOSCOPY WITH PROPOFOL N/A 02/04/2021   Procedure: COLONOSCOPY WITH PROPOFOL;  Surgeon: Harvel Quale, MD;  [facility];  Service: Gastroenterology;  Laterality: N/A;  1:45   ERCP N/A 09/17/2016   Procedure: ENDOSCOPIC RETROGRADE CHOLANGIOPANCREATOGRAPHY (ERCP);  Surgeon: [name], MD;  [facility];  Service: Endoscopy;  Laterality: N/A;  730   ERCP N/A 12/03/2016   Procedure: ENDOSCOPIC RETROGRADE CHOLANGIOPANCREATOGRAPHY (ERCP);  Surgeon: [name], MD;  [facility];  Service: Gastroenterology;  Laterality: N/A;   ESOPHAGEAL DILATION  02/03/2021   Procedure: ESOPHAGEAL DILATION;  Surgeon: Montez Morita, Quillian Quince, MD;  [facility];  Service: Gastroenterology;;  savory 16 and 18 fr   ESOPHAGOGASTRODUODENOSCOPY      ESOPHAGOGASTRODUODENOSCOPY (EGD) WITH PROPOFOL N/A 02/03/2021   Procedure: ESOPHAGOGASTRODUODENOSCOPY (EGD) WITH PROPOFOL;  Surgeon: Harvel Quale, MD;  [facility];  Service: Gastroenterology;  Laterality: N/A;   GASTROINTESTINAL STENT REMOVAL N/A 12/03/2016   Procedure: BILIARY STENT REMOVAL;  Surgeon: [name], MD;  [facility];  Service: Gastroenterology;  Laterality: N/A;   NO PAST SURGERIES     POLYPECTOMY  02/04/2021   Procedure: POLYPECTOMY INTESTINAL;  Surgeon: Harvel Quale, MD;  [facility];  Service: Gastroenterology;;   SPHINCTEROTOMY N/A 09/17/2016   Procedure: Joan Mayans;  Surgeon: [name], MD;  [facility];  Service: Endoscopy;  Laterality: N/A;    Social History   Socioeconomic History   Marital status: Widowed    Spouse name: Not on file   Number of children: Not on file   Years of education: Not on file   Highest education level: Not on file  Occupational History   Occupation: disabled  Tobacco Use   Smoking status: Never   Smokeless tobacco: Never  Vaping Use   Vaping Use: Never used  Substance and Sexual Activity   Alcohol use: No   Drug use: No   Sexual activity: Not Currently  Other Topics Concern   Not on file  Social History Narrative   Not on file   Social Determinants of Health   Financial Resource Strain: Low Risk  (04/22/2022)   Overall Financial Resource Strain (CARDIA)    Difficulty of Paying Living Expenses: Not hard at all  Food Insecurity: No Food Insecurity (04/22/2022)   Hunger Vital Sign    Worried About Running Out of Food in the Last Year: Never true    [place] in the Last Year: Never true  Transportation Needs: No Transportation Needs (04/22/2022)   PRAPARE - Hydrologist (Medical): No    Lack of Transportation (Non-Medical): No  Physical Activity: Inactive (04/22/2022)   Exercise Vital Sign    Days of  Exercise per Week: 0 days    Minutes of Exercise per Session: 0 min  Stress: No Stress Concern Present (04/22/2022)   [place]    Feeling of Stress : Not at all  Social Connections: Not on file  Intimate Partner Violence: Not At Risk (03/21/2019)   Humiliation, Afraid, Rape, and Kick questionnaire    Fear of Current or Ex-Partner: No    Emotionally Abused: No    Physically Abused: No    Sexually Abused: No    Family History  Problem Relation Age of Onset   Hyperlipidemia Father        in a nursing home   Colon cancer Father    Stroke Mother 55    Anti-infectives: Anti-infectives (From admission, onward)    None       Current Outpatient Medications  Medication Sig Dispense Refill   Ascorbic Acid (VITAMIN C) 500 MG CAPS Take 500 mg by mouth daily.      aspirin EC 81 MG tablet Take 1 tablet (81 mg total) by mouth daily.     atorvastatin (LIPITOR) 80 MG tablet TAKE 1 TABLET BY MOUTH DAILY 90 tablet 1   b complex vitamins tablet Take 1 tablet by mouth daily.      cholecalciferol (VITAMIN D3) 25 MCG (1000 UNIT) tablet Take 2,000 Units by mouth daily.     CINNAMON PO Take 2,000 mg by mouth daily.     Coenzyme Q10 (COQ10) 200 MG CAPS Take 200 mg by mouth daily.     empagliflozin (JARDIANCE) 25 MG TABS tablet Take 1 tablet (25 mg total) by mouth daily before breakfast. 30 tablet 2   ezetimibe (ZETIA) 10 MG tablet TAKE 1 TABLET BY MOUTH DAILY GENERIC EQUIVALENT FOR ZETIA 90 tablet 3   FLUoxetine (PROZAC) 40 MG capsule TAKE ONE CAPSULE BY MOUTH EVERY MORNING 90 capsule 0   Garlic 254 MG CAPS Take 500 mg by mouth daily.     glucose blood (ONETOUCH ULTRA) test strip USE 1 STRIP TO CHECK GLUCOSE 3 TIMES DAILY 300 strip 3   insulin degludec (TRESIBA FLEXTOUCH) 100 UNIT/ML FlexTouch Pen Inject 55 Units into the skin daily. 15 mL 1   Insulin Pen Needle (PEN NEEDLES) 32G X 4 MM MISC Use as directed 100 each 2    lamoTRIgine (LAMICTAL) 150 MG tablet Take 1 tablet (150 mg total) by mouth 2 (two) times daily. 180 tablet 1   LORazepam (ATIVAN) 1 MG tablet TAKE 1 TABLET BY MOUTH THREE TIMES DAILY AS NEEDED FOR ANXIETY. 270 tablet 0   Menthol-Methyl Salicylate (ARTHRITIS HOT) 10-15 % CREA Apply 1 application  topically daily as needed (pain).     Menthol-Methyl Salicylate (ICY HOT) 27-06 % STCK Apply 1 application topically 3 (three) times daily as needed (pain).     metFORMIN (GLUCOPHAGE) 1000 MG tablet TAKE 1 TABLET BY MOUTH TWICE DAILY WITH MEALS 180 tablet 1   metoprolol tartrate (LOPRESSOR) 25 MG tablet TAKE 1 TABLET BY MOUTH 2 TIMES DAILY GENERIC EQUIVALENT FOR LOPRESSOR. 180 tablet 3   Multiple Vitamin (MULTIVITAMIN WITH MINERALS) TABS tablet Take 1 tablet by mouth daily.     Omega-3 Fatty Acids (FISH OIL) 1000 MG CAPS Take 2,000 mg by mouth daily.     omeprazole (PRILOSEC) 40 MG capsule TAKE ONE CAPSULE BY MOUTH ONCE DAILY. 90 capsule 0   predniSONE (STERAPRED UNI-PAK 21 TAB) 5 MG (21) TBPK tablet Take 6 pills first day; 5 pills second day; 4 pills third day; 3 pills fourth day; 2 pills next day and 1 pill last day. 21 tablet 0   Semaglutide (RYBELSUS) 14 MG TABS Take 1 tablet (14 mg total) by mouth daily at 6 (six) AM. 90 tablet 0   sildenafil (VIAGRA) 50 MG tablet Take 1 tablet (50 mg total) by mouth daily as needed for erectile dysfunction. 10 tablet 11   tadalafil (CIALIS) 5 MG tablet Take 1 tablet (5 mg total) by mouth daily as needed for erectile dysfunction. 30 tablet 11   trospium (SANCTURA) 20 MG  tablet Take 1 tablet (20 mg total) by mouth 2 (two) times daily. 60 tablet 11   valsartan (DIOVAN) 320 MG tablet Take 1 tablet (320 mg total) by mouth daily. 90 tablet 3   Current Facility-Administered Medications  Medication Dose Route Frequency Provider Last Rate Last Admin   ciprofloxacin (CIPRO) tablet 500 mg  500 mg Oral Once Irine Seal, MD         Objective: Vital signs in last 24 hours: BP  116/74   Pulse (!) 112   Intake/Output from previous day: No intake/output data recorded. Intake/Output this shift: '@IOTHISSHIFT'$ @   Physical Exam  Lab Results:  No results found for this or any previous visit (from the past 24 hour(s)).    BMET No results for input(s): "NA", "K", "CL", "CO2", "GLUCOSE", "BUN", "CREATININE", "CALCIUM" in the last 72 hours. PT/INR No results for input(s): "LABPROT", "INR" in the last 72 hours. ABG No results for input(s): "PHART", "HCO3" in the last 72 hours.  Invalid input(s): "PCO2", "PO2" UA has 3+ glucose. And 6-10 WBC.  Studies/Results: No results found. Urodynamics reviewed.    Assessment/Plan: BPH with BOO and OAB. He is better with Logan Bores but it is expensive for him.  I will try trospium '20mg'$  po bid.   I reviewed the side effects.   ED.  Tadalafil is helping him a little but I will give him sildenafil '50mg'$   to use prn with the tadalafil.   Meds ordered this encounter  Medications   trospium (SANCTURA) 20 MG tablet    Sig: Take 1 tablet (20 mg total) by mouth 2 (two) times daily.    Dispense:  60 tablet    Refill:  11   sildenafil (VIAGRA) 50 MG tablet    Sig: Take 1 tablet (50 mg total) by mouth daily as needed for erectile dysfunction.    Dispense:  10 tablet    Refill:  11     Orders Placed This Encounter  Procedures   Microscopic Examination   Urinalysis, Routine w reflex microscopic   Bladder Scan (Post Void Residual) in office     Return in about 3 months (around 08/19/2022) for with PVR.    CC: [name] FNP     Irine Seal 1/19/2024Patient ID: Robert Walters, male   DOB: 1955-08-22, 67 y.o.   MRN: [id number]

## 2022-05-20 NOTE — Telephone Encounter (Signed)
RETURNED PATIENTS CALL. NO ANSWER. NO VM.

## 2022-05-20 NOTE — Progress Notes (Cosign Needed)
05-20-2022: Patient requested for me to call. Patient stated he received a letter from [facility] that stated they will no longer provide tresiba. Asked patient to get the letter and read it to me. Letter stated the medication is levemir and not tresiba. Informed patient that letter was mailed out to all patient's that receive any medication from [facility]. Patient also stated he hasn't received anything about 2024 re enrollment. Rep at [facility] stated application that was faxed on 01-05 wasn't received. Informed tamala to refax application.

## 2022-05-20 NOTE — Telephone Encounter (Signed)
Patient lvm for Abby to call him.  Pt's # [phone]

## 2022-05-21 ENCOUNTER — Encounter: Payer: Self-pay | Admitting: Urology

## 2022-05-21 NOTE — Telephone Encounter (Signed)
Spoke to patient. He had questions regarding his Prednisone. All questions were answered to patients satisfaction.

## 2022-05-25 ENCOUNTER — Ambulatory Visit (INDEPENDENT_AMBULATORY_CARE_PROVIDER_SITE_OTHER): Payer: Medicare Other | Admitting: *Deleted

## 2022-05-25 ENCOUNTER — Encounter (INDEPENDENT_AMBULATORY_CARE_PROVIDER_SITE_OTHER): Payer: Self-pay | Admitting: Gastroenterology

## 2022-05-25 DIAGNOSIS — I10 Essential (primary) hypertension: Secondary | ICD-10-CM

## 2022-05-25 DIAGNOSIS — Z794 Long term (current) use of insulin: Secondary | ICD-10-CM

## 2022-05-25 NOTE — Plan of Care (Signed)
Chronic Care Management Provider Comprehensive Care Plan    05/25/2022 Name: Robert Walters MRN: [id number] DOB: May 31, 1955  Referral to Chronic Care Management (CCM) services was placed by Provider:  [name] FNP on Date: 05/05/22.  Chronic Condition 1: HYPERTENSION Provider Assessment and Plan Essential hypertension Comments: Blood pressure is well controlled, continue current medications. EKG done with Incomplete right bundle branch block and right axis -possible right ventricular hypertrophy. Has had these results in the past and had been followed by Dr. Cathie Olden - POCT Urinalysis Dipstick (81002) - Microalbumin / creatinine urine ratio - EKG 12-Lead   Expected Outcome/Goals Addressed This Visit (Provider CCM goals/Provider Assessment and plan  CCM (HYPERTENSION) EXPECTED OUTCOME: MONITOR, SELF-MANAGE AND REDUCE SYMPTOMS OF HYPERTENSION  Symptom Management Condition 1: Take medications as prescribed   Attend all scheduled provider appointments Call pharmacy for medication refills 3-7 days in advance of running out of medications Perform all self care activities independently  Perform IADL's (shopping, preparing meals, housekeeping, managing finances) independently Call provider office for new concerns or questions  check blood pressure weekly choose a place to take my blood pressure (home, clinic or office, retail store) write blood pressure results in a log or diary learn about high blood pressure keep a blood pressure log take blood pressure log to all doctor appointments call doctor for signs and symptoms of high blood pressure keep all doctor appointments take medications for blood pressure exactly as prescribed begin an exercise program report new symptoms to your doctor eat more whole grains, fruits and vegetables, lean meats and healthy fats Look over education mailed- low sodium diet  Chronic Condition 2: DIABETES Provider Assessment and Plan  Type 2 diabetes  mellitus with stage 3a chronic kidney disease, with long-term current use of insulin ([name]) Comments: Will check urinalysis and micro   Expected Outcome/Goals Addressed This Visit (Provider CCM goals/Provider Assessment and plan  CCM (DIABETES) EXPECTED OUTCOME:  MONITOR, SELF-MANAGE AND REDUCE SYMPTOMS OF DIABETES  Symptom Management Condition 2: Take medications as prescribed   Attend all scheduled provider appointments Call pharmacy for medication refills 3-7 days in advance of running out of medications Perform all self care activities independently  Perform IADL's (shopping, preparing meals, housekeeping, managing finances) independently Call provider office for new concerns or questions  check blood sugar at prescribed times: once daily check feet daily for cuts, sores or redness enter blood sugar readings and medication or insulin into daily log take the blood sugar log to all doctor visits take the blood sugar meter to all doctor visits drink 6 to 8 glasses of water each day fill half of plate with vegetables limit fast food meals to no more than 1 per week manage portion size prepare main meal at home 3 to 5 days each week read food labels for fat, fiber, carbohydrates and portion size Look over education mailed- hypoglycemia  Problem List Patient Active Problem List   Diagnosis Date Noted   NASH (nonalcoholic steatohepatitis) 11/20/2020   Dysphagia 11/20/2020   Chronic diastolic heart failure ([place]) 03/27/2019   Fatty liver 12/26/2018   Elevated LFTs 12/26/2018   History of colonic polyps 12/26/2018   Fall 08/28/2018   Rib pain on left side 08/28/2018   Anxiety 03/31/2018   Bipolar II disorder ([place]) 03/31/2018   Insomnia 03/31/2018   Type 2 diabetes mellitus without complication, without long-term current use of insulin ([name]) 03/24/2018   Bile leak 09/15/2016   Sepsis ([place]) 08/23/2016   Cholecystitis    Stroke (  [name]) 07/25/2015   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  07/25/2015   Dizziness 08/09/2014   TIA (transient ischemic attack) 05/15/2012   Blurred vision 05/15/2012   Ataxia 05/15/2012   Diabetes mellitus type 2 with complications ([name]) 23/76/2831   BPH (benign prostatic hyperplasia) 05/15/2012   GERD (gastroesophageal reflux disease) 05/15/2012   Obesity 05/15/2012   Bipolar 1 disorder ([name]) 05/15/2012   Essential hypertension 10/30/2010   Cardiovascular risk factor 10/30/2010   Pure hypercholesterolemia 12/05/2008    Medication Management  Current Outpatient Medications:    Ascorbic Acid (VITAMIN C) 500 MG CAPS, Take 500 mg by mouth daily. , Disp: , Rfl:    aspirin EC 81 MG tablet, Take 1 tablet (81 mg total) by mouth daily., Disp: , Rfl:    atorvastatin (LIPITOR) 80 MG tablet, TAKE 1 TABLET BY MOUTH DAILY, Disp: 90 tablet, Rfl: 1   b complex vitamins tablet, Take 1 tablet by mouth daily. , Disp: , Rfl:    cholecalciferol (VITAMIN D3) 25 MCG (1000 UNIT) tablet, Take 2,000 Units by mouth daily., Disp: , Rfl:    CINNAMON PO, Take 2,000 mg by mouth daily., Disp: , Rfl:    Coenzyme Q10 (COQ10) 200 MG CAPS, Take 200 mg by mouth daily., Disp: , Rfl:    empagliflozin (JARDIANCE) 25 MG TABS tablet, Take 1 tablet (25 mg total) by mouth daily before breakfast., Disp: 30 tablet, Rfl: 2   ezetimibe (ZETIA) 10 MG tablet, TAKE 1 TABLET BY MOUTH DAILY GENERIC EQUIVALENT FOR ZETIA, Disp: 90 tablet, Rfl: 3   FLUoxetine (PROZAC) 40 MG capsule, TAKE ONE CAPSULE BY MOUTH EVERY MORNING, Disp: 90 capsule, Rfl: 0   glucose blood (ONETOUCH ULTRA) test strip, USE 1 STRIP TO CHECK GLUCOSE 3 TIMES DAILY, Disp: 300 strip, Rfl: 3   insulin degludec (TRESIBA FLEXTOUCH) 100 UNIT/ML FlexTouch Pen, Inject 55 Units into the skin daily., Disp: 15 mL, Rfl: 1   Insulin Pen Needle (PEN NEEDLES) 32G X 4 MM MISC, Use as directed, Disp: 100 each, Rfl: 2   lamoTRIgine (LAMICTAL) 150 MG tablet, Take 1 tablet (150 mg total) by mouth 2 (two) times daily., Disp: 180 tablet, Rfl: 1    LORazepam (ATIVAN) 1 MG tablet, TAKE 1 TABLET BY MOUTH THREE TIMES DAILY AS NEEDED FOR ANXIETY., Disp: 270 tablet, Rfl: 0   Menthol-Methyl Salicylate (ARTHRITIS HOT) 10-15 % CREA, Apply 1 application  topically daily as needed (pain)., Disp: , Rfl:    Menthol-Methyl Salicylate (ICY HOT) 51-76 % STCK, Apply 1 application topically 3 (three) times daily as needed (pain)., Disp: , Rfl:    metFORMIN (GLUCOPHAGE) 1000 MG tablet, TAKE 1 TABLET BY MOUTH TWICE DAILY WITH MEALS, Disp: 180 tablet, Rfl: 1   metoprolol tartrate (LOPRESSOR) 25 MG tablet, TAKE 1 TABLET BY MOUTH 2 TIMES DAILY GENERIC EQUIVALENT FOR LOPRESSOR., Disp: 180 tablet, Rfl: 3   Multiple Vitamin (MULTIVITAMIN WITH MINERALS) TABS tablet, Take 1 tablet by mouth daily., Disp: , Rfl:    Omega-3 Fatty Acids (FISH OIL) 1000 MG CAPS, Take 2,000 mg by mouth daily., Disp: , Rfl:    omeprazole (PRILOSEC) 40 MG capsule, TAKE ONE CAPSULE BY MOUTH ONCE DAILY., Disp: 90 capsule, Rfl: 0   predniSONE (STERAPRED UNI-PAK 21 TAB) 5 MG (21) TBPK tablet, Take 6 pills first day; 5 pills second day; 4 pills third day; 3 pills fourth day; 2 pills next day and 1 pill last day., Disp: 21 tablet, Rfl: 0   Semaglutide (RYBELSUS) 14 MG TABS, Take 1 tablet (  14 mg total) by mouth daily at 6 (six) AM., Disp: 90 tablet, Rfl: 0   sildenafil (VIAGRA) 50 MG tablet, Take 1 tablet (50 mg total) by mouth daily as needed for erectile dysfunction., Disp: 10 tablet, Rfl: 11   tadalafil (CIALIS) 5 MG tablet, Take 1 tablet (5 mg total) by mouth daily as needed for erectile dysfunction., Disp: 30 tablet, Rfl: 11   trospium (SANCTURA) 20 MG tablet, Take 1 tablet (20 mg total) by mouth 2 (two) times daily., Disp: 60 tablet, Rfl: 11   valsartan (DIOVAN) 320 MG tablet, Take 1 tablet (320 mg total) by mouth daily., Disp: 90 tablet, Rfl: 3   Garlic 992 MG CAPS, Take 500 mg by mouth daily. (Patient not taking: Reported on 05/25/2022), Disp: , Rfl:   Current Facility-Administered Medications:     ciprofloxacin (CIPRO) tablet 500 mg, 500 mg, Oral, Once, Irine Seal, MD  Cognitive Assessment Identity Confirmed: : Name; DOB Cognitive Status: Normal Other:  : spoke with patient   Functional Assessment Hearing Difficulty or Deaf: no Wear Glasses or Blind: yes Vision Management: can see well w/ glasses Concentrating, Remembering or Making Decisions Difficulty (CP): no Difficulty Communicating: no Difficulty Eating/Swallowing: no Walking or Climbing Stairs Difficulty: no Dressing/Bathing Difficulty: no Doing Errands Independently Difficulty (such as shopping) (CP): no   Caregiver Assessment  Primary Source of Support/Comfort: nonrelative caregiver Name of Support/Comfort Primary Source: neighbor Scarlette Ar People in Home: alone   Planned Interventions  Provided education to patient about basic DM disease process; Reviewed medications with patient and discussed importance of medication adherence;        Reviewed prescribed diet with patient carbohydrate modified; Counseled on importance of regular laboratory monitoring as prescribed;        Discussed plans with patient for ongoing care management follow up and provided patient with direct contact information for care management team;      Provided patient with written educational materials related to hypo and hyperglycemia and importance of correct treatment;       Advised patient, providing education and rationale, to check cbg once daily and record        call provider for findings outside established parameters;       Review of patient status, including review of consultants reports, relevant laboratory and other test results, and medications completed;       Screening for signs and symptoms of depression related to chronic disease state;        Assessed social determinant of health barriers;        Evaluation of current treatment plan related to hypertension self management and patient's adherence to plan as established  by provider;   Reviewed prescribed diet low sodium Reviewed medications with patient and discussed importance of compliance;  Counseled on the importance of exercise goals with target of 150 minutes per week Discussed plans with patient for ongoing care management follow up and provided patient with direct contact information for care management team; Advised patient, providing education and rationale, to monitor blood pressure daily and record, calling PCP for findings outside established parameters;  Provided education on prescribed diet low sodium;  Discussed complications of poorly controlled blood pressure such as heart disease, stroke, circulatory complications, vision complications, kidney impairment, sexual dysfunction;  Screening for signs and symptoms of depression related to chronic disease state;  Assessed social determinant of health barriers;   Interaction and coordination with outside resources, practitioners, and providers See CCM Referral  Care Plan: Printed and mailed to  patient

## 2022-05-25 NOTE — Patient Instructions (Signed)
Please call the care guide team at [phone] if you need to cancel or reschedule your appointment.   If you are experiencing a Mental Health or [place] or need someone to talk to, please call the Suicide and Crisis Lifeline: 988 call the [place] National Suicide Prevention Lifeline: 657 398 5030 or TTY: 731-749-3011 TTY (412)747-0670) to talk to a trained counselor call 1-800-273-TALK (toll free, 24 hour hotline) go to [facility] [address], [place] [phone]) call the [facility]: [phone] call 911   Following is a copy of the CCM Program Consent:  CCM service includes personalized support from designated clinical staff supervised by the physician, including individualized plan of care and coordination with other care providers 24/7 contact phone numbers for assistance for urgent and routine care needs. Service will only be billed when office clinical staff spend 20 minutes or more in a month to coordinate care. Only one practitioner may furnish and bill the service in a calendar month. The patient may stop CCM services at amy time (effective at the end of the month) by phone call to the office staff. The patient will be responsible for cost sharing (co-pay) or up to 20% of the service fee (after annual deductible is met)  Following is a copy of your full provider care plan:   Goals Addressed             This Visit's Progress    CCM (DIABETES) EXPECTED OUTCOME:  MONITOR, SELF-MANAGE AND REDUCE SYMPTOMS OF DIABETES       Current Barriers:  Knowledge Deficits related to Diabetes management Chronic Disease Management support and education needs related to Diabetes, diet No Advanced Directives in place- pt declines information Patient reports he checks CBG once daily fasting with readings 125-145  Planned Interventions: Provided education to patient about basic DM disease process; Reviewed  medications with patient and discussed importance of medication adherence;        Reviewed prescribed diet with patient carbohydrate modified; Counseled on importance of regular laboratory monitoring as prescribed;        Discussed plans with patient for ongoing care management follow up and provided patient with direct contact information for care management team;      Provided patient with written educational materials related to hypo and hyperglycemia and importance of correct treatment;       Advised patient, providing education and rationale, to check cbg once daily and record        call provider for findings outside established parameters;       Review of patient status, including review of consultants reports, relevant laboratory and other test results, and medications completed;       Screening for signs and symptoms of depression related to chronic disease state;        Assessed social determinant of health barriers;         Symptom Management: Take medications as prescribed   Attend all scheduled provider appointments Call pharmacy for medication refills 3-7 days in advance of running out of medications Perform all self care activities independently  Perform IADL's (shopping, preparing meals, housekeeping, managing finances) independently Call provider office for new concerns or questions  check blood sugar at prescribed times: once daily check feet daily for cuts, sores or redness enter blood sugar readings and medication or insulin into daily log take the blood sugar log to all doctor visits take the blood sugar meter to all doctor visits drink 6 to 8  glasses of water each day fill half of plate with vegetables limit fast food meals to no more than 1 per week manage portion size prepare main meal at home 3 to 5 days each week read food labels for fat, fiber, carbohydrates and portion size Look over education mailed- hypoglycemia  Follow Up Plan: Telephone follow up  appointment with care management team member scheduled for:  07/09/22 at 130 pm       CCM (HYPERTENSION) EXPECTED OUTCOME: MONITOR, SELF-MANAGE AND REDUCE SYMPTOMS OF HYPERTENSION       Current Barriers:  Knowledge Deficits related to Hypertension management Chronic Disease Management support and education needs related to Hypertension, diet No Advanced Directives in place- pt declines information Patient reports he lives alone, has neighbor he can call on if needed Patient reports he does not monitor blood pressure and does not feel this is necessary  Planned Interventions: Evaluation of current treatment plan related to hypertension self management and patient's adherence to plan as established by provider;   Reviewed prescribed diet low sodium Reviewed medications with patient and discussed importance of compliance;  Counseled on the importance of exercise goals with target of 150 minutes per week Discussed plans with patient for ongoing care management follow up and provided patient with direct contact information for care management team; Advised patient, providing education and rationale, to monitor blood pressure daily and record, calling PCP for findings outside established parameters;  Provided education on prescribed diet low sodium;  Discussed complications of poorly controlled blood pressure such as heart disease, stroke, circulatory complications, vision complications, kidney impairment, sexual dysfunction;  Screening for signs and symptoms of depression related to chronic disease state;  Assessed social determinant of health barriers;   Symptom Management: Take medications as prescribed   Attend all scheduled provider appointments Call pharmacy for medication refills 3-7 days in advance of running out of medications Perform all self care activities independently  Perform IADL's (shopping, preparing meals, housekeeping, managing finances) independently Call provider office for  new concerns or questions  check blood pressure weekly choose a place to take my blood pressure (home, clinic or office, retail store) write blood pressure results in a log or diary learn about high blood pressure keep a blood pressure log take blood pressure log to all doctor appointments call doctor for signs and symptoms of high blood pressure keep all doctor appointments take medications for blood pressure exactly as prescribed begin an exercise program report new symptoms to your doctor eat more whole grains, fruits and vegetables, lean meats and healthy fats Look over education mailed- low sodium diet  Follow Up Plan: Telephone follow up appointment with care management team member scheduled for:  07/09/22 at 130 pm          The patient verbalized understanding of instructions, educational materials, and care plan provided today and agreed to receive a mailed copy of patient instructions, educational materials, and care plan.   Telephone follow up appointment with care management team member scheduled for:  07/09/22 at 130 pm  Low-Sodium Eating Plan Sodium, which is an element that makes up salt, helps you maintain a healthy balance of fluids in your body. Too much sodium can increase your blood pressure and cause fluid and waste to be held in your body. Your health care provider or dietitian may recommend following this plan if you have high blood pressure (hypertension), kidney disease, liver disease, or heart failure. Eating less sodium can help lower your blood pressure, reduce swelling, and  protect your heart, liver, and kidneys. What are tips for following this plan? Reading food labels The Nutrition Facts label lists the amount of sodium in one serving of the food. If you eat more than one serving, you must multiply the listed amount of sodium by the number of servings. Choose foods with less than 140 mg of sodium per serving. Avoid foods with 300 mg of sodium or more per  serving. Shopping  Look for lower-sodium products, often labeled as "low-sodium" or "no salt added." Always check the sodium content, even if foods are labeled as "unsalted" or "no salt added." Buy fresh foods. Avoid canned foods and pre-made or frozen meals. Avoid canned, cured, or processed meats. Buy breads that have less than 80 mg of sodium per slice. Cooking  Eat more home-cooked food and less restaurant, buffet, and fast food. Avoid adding salt when cooking. Use salt-free seasonings or herbs instead of table salt or sea salt. Check with your health care provider or pharmacist before using salt substitutes. Cook with plant-based oils, such as canola, sunflower, or olive oil. Meal planning When eating at a restaurant, ask that your food be prepared with less salt or no salt, if possible. Avoid dishes labeled as brined, pickled, cured, smoked, or made with soy sauce, miso, or teriyaki sauce. Avoid foods that contain MSG (monosodium glutamate). MSG is sometimes added to [place] food, bouillon, and some canned foods. Make meals that can be grilled, baked, poached, roasted, or steamed. These are generally made with less sodium. General information Most people on this plan should limit their sodium intake to 1,500-2,000 mg (milligrams) of sodium each day. What foods should I eat? Fruits Fresh, frozen, or canned fruit. Fruit juice. Vegetables Fresh or frozen vegetables. "No salt added" canned vegetables. "No salt added" tomato sauce and paste. Low-sodium or reduced-sodium tomato and vegetable juice. Grains Low-sodium cereals, including oats, puffed wheat and rice, and shredded wheat. Low-sodium crackers. Unsalted rice. Unsalted pasta. Low-sodium bread. Whole-grain breads and whole-grain pasta. Meats and other proteins Fresh or frozen (no salt added) meat, poultry, seafood, and fish. Low-sodium canned tuna and salmon. Unsalted nuts. Dried peas, beans, and lentils without added salt.  Unsalted canned beans. Eggs. Unsalted nut butters. Dairy Milk. Soy milk. Cheese that is naturally low in sodium, such as ricotta cheese, fresh mozzarella, or Swiss cheese. Low-sodium or reduced-sodium cheese. Cream cheese. Yogurt. Seasonings and condiments Fresh and dried herbs and spices. Salt-free seasonings. Low-sodium mustard and ketchup. Sodium-free salad dressing. Sodium-free light mayonnaise. Fresh or refrigerated horseradish. Lemon juice. Vinegar. Other foods Homemade, reduced-sodium, or low-sodium soups. Unsalted popcorn and pretzels. Low-salt or salt-free chips. The items listed above may not be a complete list of foods and beverages you can eat. Contact a dietitian for more information. What foods should I avoid? Vegetables Sauerkraut, pickled vegetables, and relishes. Olives. [place] fries. Onion rings. Regular canned vegetables (not low-sodium or reduced-sodium). Regular canned tomato sauce and paste (not low-sodium or reduced-sodium). Regular tomato and vegetable juice (not low-sodium or reduced-sodium). Frozen vegetables in sauces. Grains Instant hot cereals. Bread stuffing, pancake, and biscuit mixes. Croutons. Seasoned rice or pasta mixes. Noodle soup cups. Boxed or frozen macaroni and cheese. Regular salted crackers. Self-rising flour. Meats and other proteins Meat or fish that is salted, canned, smoked, spiced, or pickled. Precooked or cured meat, such as sausages or meat loaves. [place] Salines. Ham. Pepperoni. Hot dogs. Corned beef. Chipped beef. Salt pork. Jerky. Pickled herring. Anchovies and sardines. Regular canned tuna. Salted nuts. Dairy Processed cheese  and cheese spreads. Hard cheeses. Cheese curds. Blue cheese. Feta cheese. String cheese. Regular cottage cheese. Buttermilk. Canned milk. Fats and oils Salted butter. Regular margarine. Ghee. Bacon fat. Seasonings and condiments Onion salt, garlic salt, seasoned salt, table salt, and sea salt. Canned and packaged gravies.  Worcestershire sauce. Tartar sauce. Barbecue sauce. Teriyaki sauce. Soy sauce, including reduced-sodium. Steak sauce. Fish sauce. Oyster sauce. Cocktail sauce. Horseradish that you find on the shelf. Regular ketchup and mustard. Meat flavorings and tenderizers. Bouillon cubes. Hot sauce. Pre-made or packaged marinades. Pre-made or packaged taco seasonings. Relishes. Regular salad dressings. Salsa. Other foods Salted popcorn and pretzels. Corn chips and puffs. Potato and tortilla chips. Canned or dried soups. Pizza. Frozen entrees and pot pies. The items listed above may not be a complete list of foods and beverages you should avoid. Contact a dietitian for more information. Summary Eating less sodium can help lower your blood pressure, reduce swelling, and protect your heart, liver, and kidneys. Most people on this plan should limit their sodium intake to 1,500-2,000 mg (milligrams) of sodium each day. Canned, boxed, and frozen foods are high in sodium. Restaurant foods, fast foods, and pizza are also very high in sodium. You also get sodium by adding salt to food. Try to cook at home, eat more fresh fruits and vegetables, and eat less fast food and canned, processed, or prepared foods. This information is not intended to replace advice given to you by your health care provider. Make sure you discuss any questions you have with your health care provider. Document Revised: 05/25/2019 Document Reviewed: 03/21/2019 Elsevier Patient Education  [place]. Hypoglycemia Hypoglycemia is when the sugar (glucose) level in your blood is too low. Low blood sugar can happen to people who have diabetes and people who do not have diabetes. Low blood sugar can happen quickly, and it can be an emergency. What are the causes? This condition happens most often in people who have diabetes. It may be caused by: Diabetes medicine. Not eating enough, or not eating often enough. Doing more physical  activity. Drinking alcohol on an empty stomach. If you do not have diabetes, this condition may be caused by: A tumor in the pancreas. Not eating enough, or not eating for long periods at a time (fasting). A very bad infection or illness. Problems after having weight loss (bariatric) surgery. Kidney failure or liver failure. Certain medicines. What increases the risk? This condition is more likely to develop in people who: Have diabetes and take medicines to lower their blood sugar. Abuse alcohol. Have a very bad illness. What are the signs or symptoms? Mild Hunger. Sweating and feeling clammy. Feeling dizzy or light-headed. Being sleepy or having trouble sleeping. Feeling like you may vomit (nauseous). A fast heartbeat. A headache. Blurry vision. Mood changes, such as: Being grouchy. Feeling worried or nervous (anxious). Tingling or loss of feeling (numbness) around your mouth, lips, or tongue. Moderate Confusion and poor judgment. Behavior changes. Weakness. Uneven heartbeat. Trouble with moving (coordination). Very low Very low blood sugar (severe hypoglycemia) is a medical emergency. It can cause: Fainting. Seizures. Loss of consciousness (coma). Death. How is this treated? Treating low blood sugar Low blood sugar is often treated by eating or drinking something that has sugar in it right away. The food or drink should contain 15 grams of a fast-acting carb (carbohydrate). Options include: 4 oz (120 mL) of fruit juice. 4 oz (120 mL) of regular soda (not diet soda). A few pieces of hard  candy. Check food labels to see how many pieces to eat for 15 grams. 1 Tbsp (15 mL) of sugar or honey. 4 glucose tablets. 1 tube of glucose gel. Treating low blood sugar if you have diabetes If you can think clearly and swallow safely, follow the 15:15 rule: Take 15 grams of a fast-acting carb. Talk with your doctor about how much you should take. Always keep a source of  fast-acting carb with you, such as: Glucose tablets (take 4 tablets). A few pieces of hard candy. Check food labels to see how many pieces to eat for 15 grams. 4 oz (120 mL) of fruit juice. 4 oz (120 mL) of regular soda (not diet soda). 1 Tbsp (15 mL) of honey or sugar. 1 tube of glucose gel. Check your blood sugar 15 minutes after you take the carb. If your blood sugar is still at or below 70 mg/dL (3.9 mmol/L), take 15 grams of a carb again. If your blood sugar does not go above 70 mg/dL (3.9 mmol/L) after 3 tries, get help right away. After your blood sugar goes back to normal, eat a meal or a snack within 1 hour.  Treating very low blood sugar If your blood sugar is below 54 mg/dL (3 mmol/L), you have very low blood sugar, or severe hypoglycemia. This is an emergency. Get medical help right away. If you have very low blood sugar and you cannot eat or drink, you will need to be given a hormone called glucagon. A family member or friend should learn how to check your blood sugar and how to give you glucagon. Ask your doctor if you need to have an emergency glucagon kit at home. Very low blood sugar may also need to be treated in a hospital. Follow these instructions at home: General instructions Take over-the-counter and prescription medicines only as told by your doctor. Stay aware of your blood sugar as told by your doctor. If you drink alcohol: Limit how much you have to: 0-1 drink a day for women who are not pregnant. 0-2 drinks a day for men. Know how much alcohol is in your drink. In the [place] one drink equals one 12 oz bottle of beer (355 mL), one 5 oz glass of wine (148 mL), or one 1 oz glass of hard liquor (44 mL). Be sure to eat food when you drink alcohol. Know that your body absorbs alcohol quickly. This may lead to low blood sugar later. Be sure to keep checking your blood sugar. Keep all follow-up visits. If you have diabetes:  Always have a fast-acting carb (15 grams)  with you to treat low blood sugar. Follow your diabetes care plan as told by your doctor. Make sure you: Know the symptoms of low blood sugar. Check your blood sugar as often as told. Always check it before and after exercise. Always check your blood sugar before you drive. Take your medicines as told. Follow your meal plan. Eat on time. Do not skip meals. Share your diabetes care plan with: Your work or school. People you live with. Carry a card or wear jewelry that says you have diabetes. Where to find more information American Diabetes Association: www.diabetes.org Contact a doctor if: You have trouble keeping your blood sugar in your target range. You have low blood sugar often. Get help right away if: You still have symptoms after you eat or drink something that contains 15 grams of fast-acting carb, and you cannot get your blood sugar above 70 mg/dL  by following the 15:15 rule. Your blood sugar is below 54 mg/dL (3 mmol/L). You have a seizure. You faint. These symptoms may be an emergency. Get help right away. Call your local emergency services (911 in the [place] Do not wait to see if the symptoms will go away. Do not drive yourself to the hospital. Summary Hypoglycemia happens when the level of sugar (glucose) in your blood is too low. Low blood sugar can happen to people who have diabetes and people who do not have diabetes. Low blood sugar can happen quickly, and it can be an emergency. Make sure you know the symptoms of low blood sugar and know how to treat it. Always keep a source of sugar (fast-acting carb) with you to treat low blood sugar. This information is not intended to replace advice given to you by your health care provider. Make sure you discuss any questions you have with your health care provider. Document Revised: 03/20/2020 Document Reviewed: 03/20/2020 Elsevier Patient Education  [place].

## 2022-05-25 NOTE — Chronic Care Management (AMB) (Signed)
Chronic Care Management   CCM RN Visit Note  05/25/2022 Name: Robert Walters MRN: [id number] DOB: 1955-05-16  Subjective: Robert Walters is a 67 y.o. year old male who is a primary care patient of [name], [place]. The patient was referred to the Chronic Care Management team for assistance with care management needs subsequent to provider initiation of CCM services and plan of care.    Today's Visit:  Engaged with patient by telephone for initial visit.     SDOH Interventions Today    Flowsheet Row Most Recent Value  SDOH Interventions   Food Insecurity Interventions Intervention Not Indicated  Housing Interventions Intervention Not Indicated  Transportation Interventions Intervention Not Indicated  Utilities Interventions Intervention Not Indicated  Financial Strain Interventions Intervention Not Indicated  Physical Activity Interventions Patient Refused  Stress Interventions Intervention Not Indicated  Social Connections Interventions Patient Refused         Goals Addressed             This Visit's Progress    CCM (DIABETES) EXPECTED OUTCOME:  MONITOR, SELF-MANAGE AND REDUCE SYMPTOMS OF DIABETES       Current Barriers:  Knowledge Deficits related to Diabetes management Chronic Disease Management support and education needs related to Diabetes, diet No Advanced Directives in place- pt declines information Patient reports he checks CBG once daily fasting with readings 125-145  Planned Interventions: Provided education to patient about basic DM disease process; Reviewed medications with patient and discussed importance of medication adherence;        Reviewed prescribed diet with patient carbohydrate modified; Counseled on importance of regular laboratory monitoring as prescribed;        Discussed plans with patient for ongoing care management follow up and provided patient with direct contact information for care management team;      Provided patient with written  educational materials related to hypo and hyperglycemia and importance of correct treatment;       Advised patient, providing education and rationale, to check cbg once daily and record        call provider for findings outside established parameters;       Review of patient status, including review of consultants reports, relevant laboratory and other test results, and medications completed;       Screening for signs and symptoms of depression related to chronic disease state;        Assessed social determinant of health barriers;         Symptom Management: Take medications as prescribed   Attend all scheduled provider appointments Call pharmacy for medication refills 3-7 days in advance of running out of medications Perform all self care activities independently  Perform IADL's (shopping, preparing meals, housekeeping, managing finances) independently Call provider office for new concerns or questions  check blood sugar at prescribed times: once daily check feet daily for cuts, sores or redness enter blood sugar readings and medication or insulin into daily log take the blood sugar log to all doctor visits take the blood sugar meter to all doctor visits drink 6 to 8 glasses of water each day fill half of plate with vegetables limit fast food meals to no more than 1 per week manage portion size prepare main meal at home 3 to 5 days each week read food labels for fat, fiber, carbohydrates and portion size Look over education mailed- hypoglycemia  Follow Up Plan: Telephone follow up appointment with care management team member scheduled for:  07/09/22 at 130 pm  CCM (HYPERTENSION) EXPECTED OUTCOME: MONITOR, SELF-MANAGE AND REDUCE SYMPTOMS OF HYPERTENSION       Current Barriers:  Knowledge Deficits related to Hypertension management Chronic Disease Management support and education needs related to Hypertension, diet No Advanced Directives in place- pt declines information Patient  reports he lives alone, has neighbor he can call on if needed Patient reports he does not monitor blood pressure and does not feel this is necessary  Planned Interventions: Evaluation of current treatment plan related to hypertension self management and patient's adherence to plan as established by provider;   Reviewed prescribed diet low sodium Reviewed medications with patient and discussed importance of compliance;  Counseled on the importance of exercise goals with target of 150 minutes per week Discussed plans with patient for ongoing care management follow up and provided patient with direct contact information for care management team; Advised patient, providing education and rationale, to monitor blood pressure daily and record, calling PCP for findings outside established parameters;  Provided education on prescribed diet low sodium;  Discussed complications of poorly controlled blood pressure such as heart disease, stroke, circulatory complications, vision complications, kidney impairment, sexual dysfunction;  Screening for signs and symptoms of depression related to chronic disease state;  Assessed social determinant of health barriers;   Symptom Management: Take medications as prescribed   Attend all scheduled provider appointments Call pharmacy for medication refills 3-7 days in advance of running out of medications Perform all self care activities independently  Perform IADL's (shopping, preparing meals, housekeeping, managing finances) independently Call provider office for new concerns or questions  check blood pressure weekly choose a place to take my blood pressure (home, clinic or office, retail store) write blood pressure results in a log or diary learn about high blood pressure keep a blood pressure log take blood pressure log to all doctor appointments call doctor for signs and symptoms of high blood pressure keep all doctor appointments take medications for blood  pressure exactly as prescribed begin an exercise program report new symptoms to your doctor eat more whole grains, fruits and vegetables, lean meats and healthy fats Look over education mailed- low sodium diet  Follow Up Plan: Telephone follow up appointment with care management team member scheduled for:  07/09/22 at 130 pm          Plan:Telephone follow up appointment with care management team member scheduled for:  07/09/22 at 130 pm  Jacqlyn Larsen [facility], BSN RN Case Manager [facility] [phone]

## 2022-05-26 ENCOUNTER — Other Ambulatory Visit: Payer: Self-pay | Admitting: Nurse Practitioner

## 2022-05-31 ENCOUNTER — Telehealth: Payer: Self-pay

## 2022-05-31 NOTE — Progress Notes (Cosign Needed)
Patient approved to receive [place] and Novofine needles through [employer] patient assistance program until 05/03/2023.

## 2022-06-01 ENCOUNTER — Ambulatory Visit: Payer: BLUE CROSS/BLUE SHIELD | Admitting: Orthopaedic Surgery

## 2022-06-02 ENCOUNTER — Ambulatory Visit: Payer: Medicare Other | Admitting: Orthopaedic Surgery

## 2022-06-02 ENCOUNTER — Encounter: Payer: Self-pay | Admitting: Orthopaedic Surgery

## 2022-06-02 DIAGNOSIS — G8929 Other chronic pain: Secondary | ICD-10-CM

## 2022-06-02 DIAGNOSIS — I10 Essential (primary) hypertension: Secondary | ICD-10-CM

## 2022-06-02 DIAGNOSIS — Z794 Long term (current) use of insulin: Secondary | ICD-10-CM | POA: Diagnosis not present

## 2022-06-02 DIAGNOSIS — M542 Cervicalgia: Secondary | ICD-10-CM

## 2022-06-02 DIAGNOSIS — E1159 Type 2 diabetes mellitus with other circulatory complications: Secondary | ICD-10-CM

## 2022-06-02 MED ORDER — PREDNISONE 5 MG (21) PO TBPK: ORAL_TABLET | ORAL | 0 refills | Status: DC

## 2022-06-02 NOTE — Progress Notes (Signed)
My neck is a little better.  He took the prednisone for his neck but he did not follow the directions.  It helped.  He would like a refill and he will take it as directed.  His neck pain is less but still present.  ROM of the neck is full.  No spasm.  ROM of shoulders full.  Encounter Diagnosis  Name Primary?   Neck pain, chronic Yes   Return in two weeks.  Call if any problem.  Precautions discussed.  Electronically Signed Sanjuana Kava, MD 1/31/20249:51 AM

## 2022-06-02 NOTE — Patient Instructions (Addendum)
 YOU HAVE BEEN PRESCRIBED PREDNISONE.   DAY 1: TAKE 6 TABLETS DAY 2: TAKE 5 TABLETS DAY 3: TAKE 4 TABLETS DAY 4: TAKE 3 TABLETS DAY 5: TAKE 2 TABLETS DAY 6: TAKE 1 TABLET  TAKE ALL OF THE TABLETS AT ONE TIME.   As the weather changes and gets cooler, you may notice you are affected more. You may have more pain in your joints. This is normal. Dress warmly and make sure that area is covered well.   Dr.Keeling is here all day on Tuesdays. He is here half a day on Wednesday mornings, and Thursday mornings. If you need anything such as a medication refill, please either call BEFORE the end of the day on [facility] or send a message through [place]. Your pharmacy can send a refill request for you. Calling by the end of the day on [facility] allows [place] time to send Dr.Keeling the request and for him to respond before he leaves on Thursdays.   My name is  and I assist [place]. If you need anything before your next appointment, please do not hesitate to call the office at [phone] and ask to leave a message for me. I will respond within 24-48 business hours.

## 2022-06-04 ENCOUNTER — Telehealth: Payer: Self-pay

## 2022-06-04 NOTE — Telephone Encounter (Signed)
Called patient to inform him that Robert Walters was ready for pick up.  He is going to come by next week to pick up his medication   Robert Walters, CPP, PharmD Clinical Pharmacist Practitioner [facility] [phone]

## 2022-06-08 ENCOUNTER — Telehealth: Payer: Self-pay

## 2022-06-08 ENCOUNTER — Other Ambulatory Visit (INDEPENDENT_AMBULATORY_CARE_PROVIDER_SITE_OTHER): Payer: Self-pay | Admitting: *Deleted

## 2022-06-08 DIAGNOSIS — K219 Gastro-esophageal reflux disease without esophagitis: Secondary | ICD-10-CM

## 2022-06-08 NOTE — Progress Notes (Cosign Needed)
06-08-2022: Patient was informed that Rybelsus is ready for pick up. He will come by Thursday to get tresiba, needles and rybelsus.  [place] Pharmacist Assistant [phone]

## 2022-06-09 ENCOUNTER — Other Ambulatory Visit: Payer: Self-pay | Admitting: *Deleted

## 2022-06-10 ENCOUNTER — Ambulatory Visit: Payer: Medicare Other | Admitting: Physician Assistant

## 2022-06-15 ENCOUNTER — Encounter: Payer: Self-pay | Admitting: Orthopaedic Surgery

## 2022-06-15 ENCOUNTER — Other Ambulatory Visit: Payer: Self-pay | Admitting: Physician Assistant

## 2022-06-15 ENCOUNTER — Ambulatory Visit: Payer: Medicare Other | Admitting: Orthopaedic Surgery

## 2022-06-15 VITALS — Ht 69.0 in | Wt 225.0 lb

## 2022-06-15 DIAGNOSIS — M542 Cervicalgia: Secondary | ICD-10-CM | POA: Diagnosis not present

## 2022-06-15 DIAGNOSIS — G8929 Other chronic pain: Secondary | ICD-10-CM | POA: Diagnosis not present

## 2022-06-15 MED ORDER — TIZANIDINE HCL 4 MG PO TABS: ORAL_TABLET | ORAL | 2 refills | Status: DC

## 2022-06-15 NOTE — Progress Notes (Signed)
My neck is a little better.  He has less pain of the right upper neck area.  He is moving it better.  He has no spasm or weakness or new trauma.  ROM of neck is nearly full but still painful.  NV intact.  Grips normal.  Encounter Diagnosis  Name Primary?   Neck pain, chronic Yes   I will give Zanaflex to take as needed.  Return in three weeks.  Call if any problem.  Precautions discussed.  Electronically Signed Sanjuana Kava, MD 2/13/20242:30 PM

## 2022-06-16 DIAGNOSIS — K080 Exfoliation of teeth due to systemic causes: Secondary | ICD-10-CM | POA: Diagnosis not present

## 2022-06-21 DIAGNOSIS — I739 Peripheral vascular disease, unspecified: Secondary | ICD-10-CM | POA: Diagnosis not present

## 2022-06-23 ENCOUNTER — Other Ambulatory Visit: Payer: Self-pay

## 2022-06-23 ENCOUNTER — Telehealth: Payer: Self-pay

## 2022-06-23 ENCOUNTER — Other Ambulatory Visit: Payer: Self-pay | Admitting: Cardiovascular Disease

## 2022-06-23 MED ORDER — VALSARTAN 320 MG PO TABS: 320.0000 mg | ORAL_TABLET | Freq: Every day | ORAL | 0 refills | Status: DC

## 2022-06-23 NOTE — Progress Notes (Cosign Needed)
As of 06/10/2022 patient picked up 5 boxes of Tresiba, 4 bottles of Rybelsus and 2 boxes of Novofine needles from PCP office, [employer] patient assistance.    Pattricia Boss, [place] Pharmacist Assistant [phone]

## 2022-07-06 ENCOUNTER — Ambulatory Visit: Payer: Medicare Other | Admitting: Orthopaedic Surgery

## 2022-07-06 ENCOUNTER — Ambulatory Visit: Payer: Medicare Other | Admitting: Physician Assistant

## 2022-07-08 ENCOUNTER — Ambulatory Visit (INDEPENDENT_AMBULATORY_CARE_PROVIDER_SITE_OTHER): Payer: Medicare Other | Admitting: Gastroenterology

## 2022-07-09 ENCOUNTER — Ambulatory Visit (INDEPENDENT_AMBULATORY_CARE_PROVIDER_SITE_OTHER): Payer: Medicare Other | Admitting: *Deleted

## 2022-07-09 DIAGNOSIS — Z794 Long term (current) use of insulin: Secondary | ICD-10-CM

## 2022-07-09 DIAGNOSIS — I10 Essential (primary) hypertension: Secondary | ICD-10-CM

## 2022-07-09 NOTE — Chronic Care Management (AMB) (Signed)
Chronic Care Management   CCM RN Visit Note  07/09/2022 Name: Robert Walters MRN: [id number] DOB: 21-Jan-1956  Subjective: Robert Walters is a 67 y.o. year old male who is a primary care patient of [name], [place]. The patient was referred to the Chronic Care Management team for assistance with care management needs subsequent to provider initiation of CCM services and plan of care.    Today's Visit:  Engaged with patient by telephone for follow up visit.        Goals Addressed             This Visit's Progress    CCM (DIABETES) EXPECTED OUTCOME:  MONITOR, SELF-MANAGE AND REDUCE SYMPTOMS OF DIABETES       Current Barriers:  Knowledge Deficits related to Diabetes management Chronic Disease Management support and education needs related to Diabetes, diet No Advanced Directives in place- pt declines information Patient reports he checks CBG once daily fasting with readings 139-142  Planned Interventions: Reviewed medications with patient and discussed importance of medication adherence;        Counseled on importance of regular laboratory monitoring as prescribed;        Advised patient, providing education and rationale, to check cbg once daily and record        call provider for findings outside established parameters;       Review of patient status, including review of consultants reports, relevant laboratory and other test results, and medications completed;       Reinforced carbohydrate modified diet   Symptom Management: Take medications as prescribed   Attend all scheduled provider appointments Call pharmacy for medication refills 3-7 days in advance of running out of medications Perform all self care activities independently  Perform IADL's (shopping, preparing meals, housekeeping, managing finances) independently Call provider office for new concerns or questions  check blood sugar at prescribed times: once daily check feet daily for cuts, sores or redness enter  blood sugar readings and medication or insulin into daily log take the blood sugar log to all doctor visits take the blood sugar meter to all doctor visits trim toenails straight across drink 6 to 8 glasses of water each day fill half of plate with vegetables limit fast food meals to no more than 1 per week manage portion size prepare main meal at home 3 to 5 days each week read food labels for fat, fiber, carbohydrates and portion size set a realistic goal  Follow Up Plan: Telephone follow up appointment with care management team member scheduled for:  10/04/22 at 130 pm       CCM (HYPERTENSION) EXPECTED OUTCOME: MONITOR, SELF-MANAGE AND REDUCE SYMPTOMS OF HYPERTENSION       Current Barriers:  Knowledge Deficits related to Hypertension management Chronic Disease Management support and education needs related to Hypertension, diet No Advanced Directives in place- pt declines information Patient reports he lives alone, has neighbor he can call on if needed Patient reports he does not monitor blood pressure and does not feel this is necessary  Planned Interventions: Evaluation of current treatment plan related to hypertension self management and patient's adherence to plan as established by provider;   Reviewed medications with patient and discussed importance of compliance;  Counseled on the importance of exercise goals with target of 150 minutes per week Advised patient, providing education and rationale, to monitor blood pressure daily and record, calling PCP for findings outside established parameters;  Advised patient to discuss any issues with blood pressure, medications  with provider; Discussed complications of poorly controlled blood pressure such as heart disease, stroke, circulatory complications, vision complications, kidney impairment, sexual dysfunction;  Reinforced low sodium diet and importance of adherence  Symptom Management: Take medications as prescribed   Attend all  scheduled provider appointments Call pharmacy for medication refills 3-7 days in advance of running out of medications Perform all self care activities independently  Perform IADL's (shopping, preparing meals, housekeeping, managing finances) independently Call provider office for new concerns or questions  check blood pressure weekly choose a place to take my blood pressure (home, clinic or office, retail store) write blood pressure results in a log or diary learn about high blood pressure keep a blood pressure log take blood pressure log to all doctor appointments call doctor for signs and symptoms of high blood pressure keep all doctor appointments take medications for blood pressure exactly as prescribed begin an exercise program report new symptoms to your doctor eat more whole grains, fruits and vegetables, lean meats and healthy fats Follow low sodium diet- read food labels for sodium content  Follow Up Plan: Telephone follow up appointment with care management team member scheduled for:    10/04/22 at 130 pm          Plan:Telephone follow up appointment with care management team member scheduled for:  10/04/22 at 130 pm  Jacqlyn Larsen [facility], BSN [place] [facility] [phone]

## 2022-07-09 NOTE — Patient Instructions (Signed)
Please call the care guide team at [phone] if you need to cancel or reschedule your appointment.   If you are experiencing a Mental Health or [place] or need someone to talk to, please call the Suicide and Crisis Lifeline: 988 call the [place] National Suicide Prevention Lifeline: (608)071-8408 or TTY: 510-354-8029 TTY 830-461-1534) to talk to a trained counselor call 1-800-273-TALK (toll free, 24 hour hotline) go to [facility] [address], [place] [phone]) call the [facility]: [phone] call 911   Following is a copy of the CCM Program Consent:  CCM service includes personalized support from designated clinical staff supervised by the physician, including individualized plan of care and coordination with other care providers 24/7 contact phone numbers for assistance for urgent and routine care needs. Service will only be billed when office clinical staff spend 20 minutes or more in a month to coordinate care. Only one practitioner may furnish and bill the service in a calendar month. The patient may stop CCM services at amy time (effective at the end of the month) by phone call to the office staff. The patient will be responsible for cost sharing (co-pay) or up to 20% of the service fee (after annual deductible is met)  Following is a copy of your full provider care plan:   Goals Addressed             This Visit's Progress    CCM (DIABETES) EXPECTED OUTCOME:  MONITOR, SELF-MANAGE AND REDUCE SYMPTOMS OF DIABETES       Current Barriers:  Knowledge Deficits related to Diabetes management Chronic Disease Management support and education needs related to Diabetes, diet No Advanced Directives in place- pt declines information Patient reports he checks CBG once daily fasting with readings 139-142  Planned Interventions: Reviewed medications with patient and discussed importance of medication  adherence;        Counseled on importance of regular laboratory monitoring as prescribed;        Advised patient, providing education and rationale, to check cbg once daily and record        call provider for findings outside established parameters;       Review of patient status, including review of consultants reports, relevant laboratory and other test results, and medications completed;       Reinforced carbohydrate modified diet   Symptom Management: Take medications as prescribed   Attend all scheduled provider appointments Call pharmacy for medication refills 3-7 days in advance of running out of medications Perform all self care activities independently  Perform IADL's (shopping, preparing meals, housekeeping, managing finances) independently Call provider office for new concerns or questions  check blood sugar at prescribed times: once daily check feet daily for cuts, sores or redness enter blood sugar readings and medication or insulin into daily log take the blood sugar log to all doctor visits take the blood sugar meter to all doctor visits trim toenails straight across drink 6 to 8 glasses of water each day fill half of plate with vegetables limit fast food meals to no more than 1 per week manage portion size prepare main meal at home 3 to 5 days each week read food labels for fat, fiber, carbohydrates and portion size set a realistic goal  Follow Up Plan: Telephone follow up appointment with care management team member scheduled for:  10/04/22 at 130 pm       CCM (HYPERTENSION) EXPECTED OUTCOME: MONITOR, SELF-MANAGE AND REDUCE SYMPTOMS OF HYPERTENSION  Current Barriers:  Knowledge Deficits related to Hypertension management Chronic Disease Management support and education needs related to Hypertension, diet No Advanced Directives in place- pt declines information Patient reports he lives alone, has neighbor he can call on if needed Patient reports he does not  monitor blood pressure and does not feel this is necessary  Planned Interventions: Evaluation of current treatment plan related to hypertension self management and patient's adherence to plan as established by provider;   Reviewed medications with patient and discussed importance of compliance;  Counseled on the importance of exercise goals with target of 150 minutes per week Advised patient, providing education and rationale, to monitor blood pressure daily and record, calling PCP for findings outside established parameters;  Advised patient to discuss any issues with blood pressure, medications with provider; Discussed complications of poorly controlled blood pressure such as heart disease, stroke, circulatory complications, vision complications, kidney impairment, sexual dysfunction;  Reinforced low sodium diet and importance of adherence  Symptom Management: Take medications as prescribed   Attend all scheduled provider appointments Call pharmacy for medication refills 3-7 days in advance of running out of medications Perform all self care activities independently  Perform IADL's (shopping, preparing meals, housekeeping, managing finances) independently Call provider office for new concerns or questions  check blood pressure weekly choose a place to take my blood pressure (home, clinic or office, retail store) write blood pressure results in a log or diary learn about high blood pressure keep a blood pressure log take blood pressure log to all doctor appointments call doctor for signs and symptoms of high blood pressure keep all doctor appointments take medications for blood pressure exactly as prescribed begin an exercise program report new symptoms to your doctor eat more whole grains, fruits and vegetables, lean meats and healthy fats Follow low sodium diet- read food labels for sodium content  Follow Up Plan: Telephone follow up appointment with care management team member  scheduled for:    10/04/22 at 130 pm          The patient verbalized understanding of instructions, educational materials, and care plan provided today and DECLINED offer to receive copy of patient instructions, educational materials, and care plan.   Telephone follow up appointment with care management team member scheduled for:  10/04/22 at 130 pm

## 2022-07-15 ENCOUNTER — Other Ambulatory Visit: Payer: Self-pay | Admitting: Physician Assistant

## 2022-07-20 ENCOUNTER — Ambulatory Visit: Payer: Medicare Other | Admitting: Orthopaedic Surgery

## 2022-07-20 ENCOUNTER — Encounter: Payer: Self-pay | Admitting: Orthopaedic Surgery

## 2022-07-20 DIAGNOSIS — M47812 Spondylosis without myelopathy or radiculopathy, cervical region: Secondary | ICD-10-CM | POA: Diagnosis not present

## 2022-07-20 DIAGNOSIS — G8929 Other chronic pain: Secondary | ICD-10-CM

## 2022-07-20 NOTE — Progress Notes (Signed)
My neck still hurts.  He has neck pain on the right side and no paresthesias.  He had MRI in January.  He declines epidural injection.  He has good and bad days.  The Zanaflex helps.  Neck has good ROM but painful to the right.  NV intact.  Encounter Diagnoses  Name Primary?   Neck pain, chronic Yes   Cervical spine arthritis    I have told him to continue as he is doing.  He will reconsider epidural if he gets worse.  Return in one month.  Call if any problem.  Precautions discussed.  Electronically Signed Sanjuana Kava, MD 3/19/20242:27 PM

## 2022-07-26 ENCOUNTER — Ambulatory Visit (INDEPENDENT_AMBULATORY_CARE_PROVIDER_SITE_OTHER): Payer: Medicare Other | Admitting: Gastroenterology

## 2022-08-01 DIAGNOSIS — I10 Essential (primary) hypertension: Secondary | ICD-10-CM

## 2022-08-01 DIAGNOSIS — Z794 Long term (current) use of insulin: Secondary | ICD-10-CM | POA: Diagnosis not present

## 2022-08-01 DIAGNOSIS — E1159 Type 2 diabetes mellitus with other circulatory complications: Secondary | ICD-10-CM | POA: Diagnosis not present

## 2022-08-04 ENCOUNTER — Telehealth: Payer: Self-pay

## 2022-08-04 NOTE — Progress Notes (Cosign Needed)
04-03: Contacted [employer] to follow up on [place]. Representative was able to process shipment and medication will arrive 7-10 days. Patient has to call in for deliveries due to company not having auto refill. Patient was informed about shipment and to call 3 weeks prior to next shipment.  [place] Pharmacist Assistant [phone]

## 2022-08-17 ENCOUNTER — Ambulatory Visit: Payer: Medicare Other | Admitting: Orthopaedic Surgery

## 2022-08-18 ENCOUNTER — Telehealth: Payer: Self-pay

## 2022-08-18 NOTE — Progress Notes (Cosign Needed)
[employer] patient assistance program notification:  120- day supply of Robert Walters will be filled on 08/22/2022 and should arrive to the office in 10-14 business days. Patient enrollment will expire on 05/03/2023.   Billee Cashing, CMA Clinical Pharmacist Assistant [phone]

## 2022-08-19 ENCOUNTER — Ambulatory Visit: Payer: Medicare Other | Admitting: Urology

## 2022-08-19 ENCOUNTER — Ambulatory Visit: Payer: Medicare Other | Admitting: Physician Assistant

## 2022-08-25 NOTE — Telephone Encounter (Signed)
Latoya from [employer] called and asked to speak with someone about his application  [phone]  Thanks!

## 2022-08-26 NOTE — Telephone Encounter (Cosign Needed)
Thank you, we will get in touch with them.   Billee Cashing, CMA Clinical Pharmacist Assistant [phone]

## 2022-08-27 NOTE — Progress Notes (Signed)
08-27-2022: Contacted [employer] in reference to a call back about patient. Was informed call was needed to verify if patient received last shipment of tresiba. Company is trying to make sure no shipment were missed during the shortage. Next delivery for tresiba is being processed and if shipment hasn't arrived by 05-09 call [employer] on 05-10 for a free voucher.  Huey Romans [facility] Clinical Pharmacist Assistant [phone]

## 2022-09-02 ENCOUNTER — Encounter: Payer: Medicare Other | Admitting: Nurse Practitioner

## 2022-09-02 NOTE — Progress Notes (Signed)
Not seen

## 2022-09-02 NOTE — Patient Instructions (Signed)

## 2022-09-06 DIAGNOSIS — I739 Peripheral vascular disease, unspecified: Secondary | ICD-10-CM | POA: Diagnosis not present

## 2022-09-07 ENCOUNTER — Ambulatory Visit: Payer: Medicare Other | Admitting: Orthopaedic Surgery

## 2022-09-07 ENCOUNTER — Telehealth: Payer: Self-pay

## 2022-09-07 NOTE — Telephone Encounter (Signed)
Confirmed with patient that his insulin has come in and that the name is different but it is the same medication. He confirmed understanding and reported that he will pick it up on Thursday during his office visit.  Cherylin Mylar, CPP, PharmD Clinical Pharmacist Practitioner [facility] [phone]

## 2022-09-09 ENCOUNTER — Ambulatory Visit (INDEPENDENT_AMBULATORY_CARE_PROVIDER_SITE_OTHER): Payer: Medicare Other | Admitting: Nurse Practitioner

## 2022-09-09 ENCOUNTER — Encounter: Payer: Self-pay | Admitting: Nurse Practitioner

## 2022-09-09 VITALS — BP 120/60 | HR 104 | Temp 98.4°F | Ht 69.0 in | Wt 223.0 lb

## 2022-09-09 DIAGNOSIS — I5032 Chronic diastolic (congestive) heart failure: Secondary | ICD-10-CM

## 2022-09-09 DIAGNOSIS — E1169 Type 2 diabetes mellitus with other specified complication: Secondary | ICD-10-CM

## 2022-09-09 DIAGNOSIS — E785 Hyperlipidemia, unspecified: Secondary | ICD-10-CM

## 2022-09-09 DIAGNOSIS — E118 Type 2 diabetes mellitus with unspecified complications: Secondary | ICD-10-CM

## 2022-09-09 DIAGNOSIS — E1159 Type 2 diabetes mellitus with other circulatory complications: Secondary | ICD-10-CM

## 2022-09-09 DIAGNOSIS — E782 Mixed hyperlipidemia: Secondary | ICD-10-CM | POA: Insufficient documentation

## 2022-09-09 DIAGNOSIS — I10 Essential (primary) hypertension: Secondary | ICD-10-CM | POA: Diagnosis not present

## 2022-09-09 DIAGNOSIS — I110 Hypertensive heart disease with heart failure: Secondary | ICD-10-CM | POA: Diagnosis not present

## 2022-09-09 NOTE — Patient Instructions (Signed)
Hypertension, Adult ?Hypertension is another name for high blood pressure. High blood pressure forces your heart to work harder to pump blood. This can cause problems over time. ?There are two numbers in a blood pressure reading. There is a top number (systolic) over a bottom number (diastolic). It is best to have a blood pressure that is below 120/80. ?What are the causes? ?The cause of this condition is not known. Some other conditions can lead to high blood pressure. ?What increases the risk? ?Some lifestyle factors can make you more likely to develop high blood pressure: ?Smoking. ?Not getting enough exercise or physical activity. ?Being overweight. ?Having too much fat, sugar, calories, or salt (sodium) in your diet. ?Drinking too much alcohol. ?Other risk factors include: ?Having any of these conditions: ?Heart disease. ?Diabetes. ?High cholesterol. ?Kidney disease. ?Obstructive sleep apnea. ?Having a family history of high blood pressure and high cholesterol. ?Age. The risk increases with age. ?Stress. ?What are the signs or symptoms? ?High blood pressure may not cause symptoms. Very high blood pressure (hypertensive crisis) may cause: ?Headache. ?Fast or uneven heartbeats (palpitations). ?Shortness of breath. ?Nosebleed. ?Vomiting or feeling like you may vomit (nauseous). ?Changes in how you see. ?Very bad chest pain. ?Feeling dizzy. ?Seizures. ?How is this treated? ?This condition is treated by making healthy lifestyle changes, such as: ?Eating healthy foods. ?Exercising more. ?Drinking less alcohol. ?Your doctor may prescribe medicine if lifestyle changes do not help enough and if: ?Your top number is above 130. ?Your bottom number is above 80. ?Your personal target blood pressure may vary. ?Follow these instructions at home: ?Eating and drinking ? ?If told, follow the DASH eating plan. To follow this plan: ?Fill one half of your plate at each meal with fruits and vegetables. ?Fill one fourth of your plate  at each meal with whole grains. Whole grains include whole-wheat pasta, brown rice, and whole-grain bread. ?Eat or drink low-fat dairy products, such as skim milk or low-fat yogurt. ?Fill one fourth of your plate at each meal with low-fat (lean) proteins. Low-fat proteins include fish, chicken without skin, eggs, beans, and tofu. ?Avoid fatty meat, cured and processed meat, or chicken with skin. ?Avoid pre-made or processed food. ?Limit the amount of salt in your diet to less than 1,500 mg each day. ?Do not drink alcohol if: ?Your doctor tells you not to drink. ?You are pregnant, may be pregnant, or are planning to become pregnant. ?If you drink alcohol: ?Limit how much you have to: ?0-1 drink a day for women. ?0-2 drinks a day for men. ?Know how much alcohol is in your drink. In the [place] one drink equals one 12 oz bottle of beer (355 mL), one 5 oz glass of wine (148 mL), or one 1? oz glass of hard liquor (44 mL). ?Lifestyle ? ?Work with your doctor to stay at a healthy weight or to lose weight. Ask your doctor what the best weight is for you. ?Get at least 30 minutes of exercise that causes your heart to beat faster (aerobic exercise) most days of the week. This may include walking, swimming, or biking. ?Get at least 30 minutes of exercise that strengthens your muscles (resistance exercise) at least 3 days a week. This may include lifting weights or doing Pilates. ?Do not smoke or use any products that contain nicotine or tobacco. If you need help quitting, ask your doctor. ?Check your blood pressure at home as told by your doctor. ?Keep all follow-up visits. ?Medicines ?Take over-the-counter and prescription medicines   only as told by your doctor. Follow directions carefully. ?Do not skip doses of blood pressure medicine. The medicine does not work as well if you skip doses. Skipping doses also puts you at risk for problems. ?Ask your doctor about side effects or reactions to medicines that you should watch  for. ?Contact a doctor if: ?You think you are having a reaction to the medicine you are taking. ?You have headaches that keep coming back. ?You feel dizzy. ?You have swelling in your ankles. ?You have trouble with your vision. ?Get help right away if: ?You get a very bad headache. ?You start to feel mixed up (confused). ?You feel weak or numb. ?You feel faint. ?You have very bad pain in your: ?Chest. ?Belly (abdomen). ?You vomit more than once. ?You have trouble breathing. ?These symptoms may be an emergency. Get help right away. Call 911. ?Do not wait to see if the symptoms will go away. ?Do not drive yourself to the hospital. ?Summary ?Hypertension is another name for high blood pressure. ?High blood pressure forces your heart to work harder to pump blood. ?For most people, a normal blood pressure is less than 120/80. ?Making healthy choices can help lower blood pressure. If your blood pressure does not get lower with healthy choices, you may need to take medicine. ?This information is not intended to replace advice given to you by your health care provider. Make sure you discuss any questions you have with your health care provider. ?Document Revised: 02/05/2021 Document Reviewed: 02/05/2021 ?Elsevier Patient Education ? 2023 Elsevier Inc. ? ?

## 2022-09-09 NOTE — Progress Notes (Addendum)
Hershal Coria Martin,acting as a Neurosurgeon for Arnette Felts, FNP.,have documented all relevant documentation on the behalf of Arnette Felts, FNP,as directed by  Arnette Felts, FNP while in the presence of Arnette Felts, FNP.    Subjective:     Patient ID: Robert Walters , male    DOB: 17-Aug-1955 , 67 y.o.   MRN: [id number]   Chief Complaint  Patient presents with   Diabetes   Hypertension    HPI  Patient presents today for dm and bp check. No other issues. Continues with behavioral health, no changes. Patient reports he hasn't had any of his medications today because he hasn't eaten.   BP Readings from Last 3 Encounters: 09/09/22 : 120/60 05/20/22 : 116/74 05/11/22 : 123/79     Hypertension This is a chronic problem. The current episode started more than 1 year ago. The problem is unchanged. The problem is controlled. Pertinent negatives include no anxiety, chest pain, headaches or palpitations. There are no associated agents to hypertension. Risk factors for coronary artery disease include obesity and sedentary lifestyle. There are no compliance problems.  There is no history of angina. There is no history of chronic renal disease.  Diabetes He presents for his follow-up diabetic visit. He has type 2 diabetes mellitus. His disease course has been stable. There are no hypoglycemic associated symptoms. Pertinent negatives for hypoglycemia include no dizziness or headaches. There are no diabetic associated symptoms. Pertinent negatives for diabetes include no chest pain, no fatigue, no polydipsia, no polyphagia and no polyuria. There are no hypoglycemic complications. Symptoms are stable. Diabetic complications include heart disease. Risk factors for coronary artery disease include diabetes mellitus, sedentary lifestyle, male sex and obesity. Current diabetic treatment includes oral agent (triple therapy) (Continues with tresiba, jardiance and rybelsus). He is compliant with treatment all of the  time. He is following a generally unhealthy diet. He has not had a previous visit with a dietitian. He rarely participates in exercise. (Blood sugars in 130-160, occasionally will be 180) An ACE inhibitor/angiotensin II receptor blocker is being taken. He does not see a podiatrist.Eye exam is current.     Past Medical History:  Diagnosis Date   Acid reflux disease    Bipolar 1 disorder, mixed (HCC)    BPH (benign prostatic hypertrophy)    CVA (cerebral vascular accident) (HCC) 07/2015   Gastritis    H/O hiatal hernia    Hyperlipidemia    Hypertension    Normal nuclear stress test Dec 2011   Obesity    TIA (transient ischemic attack) 05/15/2012   "they think I've had one this am @ 0130" (05/15/2012)   Type II diabetes mellitus (HCC)    Vertigo      Family History  Problem Relation Age of Onset   Hyperlipidemia Father        in a nursing home   Colon cancer Father    Stroke Mother 61     Current Outpatient Medications:    Ascorbic Acid (VITAMIN C) 500 MG CAPS, Take 500 mg by mouth daily. , Disp: , Rfl:    aspirin EC 81 MG tablet, Take 1 tablet (81 mg total) by mouth daily., Disp: , Rfl:    atorvastatin (LIPITOR) 80 MG tablet, TAKE 1 TABLET BY MOUTH DAILY, Disp: 90 tablet, Rfl: 1   b complex vitamins tablet, Take 1 tablet by mouth daily. , Disp: , Rfl:    cholecalciferol (VITAMIN D3) 25 MCG (1000 UNIT) tablet, Take 2,000 Units by mouth daily.,  Disp: , Rfl:    Coenzyme Q10 (COQ10) 200 MG CAPS, Take 200 mg by mouth daily., Disp: , Rfl:    empagliflozin (JARDIANCE) 25 MG TABS tablet, Take 1 tablet (25 mg total) by mouth daily before breakfast., Disp: 30 tablet, Rfl: 2   ezetimibe (ZETIA) 10 MG tablet, TAKE 1 TABLET BY MOUTH DAILY GENERIC EQUIVALENT FOR ZETIA, Disp: 90 tablet, Rfl: 3   FLUoxetine (PROZAC) 40 MG capsule, TAKE 1 CAPSULE BY MOUTH EVERY MORNING, Disp: 90 capsule, Rfl: 0   Garlic 500 MG CAPS, Take 500 mg by mouth daily., Disp: , Rfl:    glucose blood (ONETOUCH ULTRA) test  strip, USE 1 STRIP TO CHECK GLUCOSE 3 TIMES DAILY, Disp: 300 strip, Rfl: 3   insulin degludec (TRESIBA FLEXTOUCH) 100 UNIT/ML FlexTouch Pen, Inject 55 Units into the skin daily., Disp: 15 mL, Rfl: 1   Insulin Pen Needle (PEN NEEDLES) 32G X 4 MM MISC, Use as directed, Disp: 100 each, Rfl: 2   lamoTRIgine (LAMICTAL) 150 MG tablet, Take 1 tablet (150 mg total) by mouth 2 (two) times daily., Disp: 180 tablet, Rfl: 1   LORazepam (ATIVAN) 1 MG tablet, TAKE 1 TABLET BY MOUTH 3 TIMES DAILY AS NEEDED FOR ANXIETY. GENERIC EQUIVALENT FOR ATIVAN, Disp: 270 tablet, Rfl: 0   Menthol-Methyl Salicylate (ARTHRITIS HOT) 10-15 % CREA, Apply 1 application  topically daily as needed (pain)., Disp: , Rfl:    Menthol-Methyl Salicylate (ICY HOT) 10-30 % STCK, Apply 1 application topically 3 (three) times daily as needed (pain)., Disp: , Rfl:    metFORMIN (GLUCOPHAGE) 1000 MG tablet, TAKE 1 TABLET BY MOUTH TWICE DAILY WITH MEALS, Disp: 180 tablet, Rfl: 1   metoprolol tartrate (LOPRESSOR) 25 MG tablet, TAKE 1 TABLET BY MOUTH 2 TIMES DAILY GENERIC EQUIVALENT FOR LOPRESSOR. Please call [phone] to schedule an appointment for future refills. 1st attempt. Thank you., Disp: 180 tablet, Rfl: 0   Multiple Vitamin (MULTIVITAMIN WITH MINERALS) TABS tablet, Take 1 tablet by mouth daily., Disp: , Rfl:    Omega-3 Fatty Acids (FISH OIL) 1000 MG CAPS, Take 2,000 mg by mouth daily., Disp: , Rfl:    Semaglutide (RYBELSUS) 14 MG TABS, Take 1 tablet (14 mg total) by mouth daily at 6 (six) AM., Disp: 90 tablet, Rfl: 0   sildenafil (VIAGRA) 50 MG tablet, Take 1 tablet (50 mg total) by mouth daily as needed for erectile dysfunction., Disp: 10 tablet, Rfl: 11   tadalafil (CIALIS) 5 MG tablet, Take 1 tablet (5 mg total) by mouth daily as needed for erectile dysfunction., Disp: 30 tablet, Rfl: 11   tiZANidine (ZANAFLEX) 4 MG tablet, One by mouth twice a day as needed for neck spasm, Disp: 30 tablet, Rfl: 2   trospium (SANCTURA) 20 MG tablet, Take  1 tablet (20 mg total) by mouth 2 (two) times daily., Disp: 60 tablet, Rfl: 11   valsartan (DIOVAN) 320 MG tablet, Take 1 tablet (320 mg total) by mouth daily., Disp: 90 tablet, Rfl: 0   omeprazole (PRILOSEC) 40 MG capsule, TAKE ONE CAPSULE BY MOUTH ONCE DAILY., Disp: 90 capsule, Rfl: 0  Current Facility-Administered Medications:    ciprofloxacin (CIPRO) tablet 500 mg, 500 mg, Oral, Once, Bjorn Pippin, MD   No Known Allergies   Review of Systems  Constitutional:  Negative for fatigue.  Cardiovascular:  Negative for chest pain and palpitations.  Endocrine: Negative for polydipsia, polyphagia and polyuria.  Neurological:  Negative for dizziness and headaches.     Today's Vitals   09/09/22 1601  BP: 120/60  Pulse: (!) 104  Temp: 98.4 F (36.9 C)  TempSrc: Oral  Weight: 223 lb (101.2 kg)  Height: 5\' 9"  (1.753 m)  PainSc: 0-No pain   Body mass index is 32.93 kg/m.  The ASCVD Risk score (Arnett DK, et al., 2019) failed to calculate for the following reasons:   The patient has a prior MI or stroke diagnosis ++ Wt Readings from Last 3 Encounters:  09/09/22 223 lb (101.2 kg)  06/15/22 225 lb (102.1 kg)  05/11/22 225 lb (102.1 kg)    Objective:  Physical Exam Vitals reviewed.  Constitutional:      General: He is not in acute distress.    Appearance: Normal appearance. He is obese.  Eyes:     Extraocular Movements: Extraocular movements intact.     Conjunctiva/sclera: Conjunctivae normal.     Pupils: Pupils are equal, round, and reactive to light.  Cardiovascular:     Rate and Rhythm: Normal rate and regular rhythm.     Pulses: Normal pulses.     Heart sounds: Normal heart sounds. No murmur heard. Pulmonary:     Effort: Pulmonary effort is normal. No respiratory distress.     Breath sounds: Normal breath sounds. No wheezing.  Skin:    General: Skin is warm and dry.     Capillary Refill: Capillary refill takes less than 2 seconds.  Neurological:     General: No focal  deficit present.     Mental Status: He is alert and oriented to person, place, and time.     Cranial Nerves: No cranial nerve deficit.     Motor: No weakness.  Psychiatric:        Mood and Affect: Mood normal.        Behavior: Behavior normal.        Thought Content: Thought content normal.        Judgment: Judgment normal.         Assessment And Plan:     1. Type 2 diabetes mellitus with hyperlipidemia (HCC) Comments: Hgba1c is improving, continue current medications. Will check HgbA1c. - Hemoglobin A1c - Basic metabolic panel - Lipid panel  2. Hypertensive heart disease with chronic diastolic congestive heart failure (HCC) Comments: Blood pressure is well controlled, continue current medications and f/u with Cardiology - Basic metabolic panel  3. Hypertension associated with diabetes (HCC) Comments: Bllood pressure is well controlled, continue current medications - Basic metabolic panel    Return for controlled DM check 4 months.   Patient was given opportunity to ask questions. Patient verbalized understanding of the plan and was able to repeat key elements of the plan. All questions were answered to their satisfaction.  Arnette Felts, FNP   I, Arnette Felts, FNP, have reviewed all documentation for this visit. The documentation on 09/22/22 for the exam, diagnosis, procedures, and orders are all accurate and complete.   IF YOU HAVE BEEN REFERRED TO A SPECIALIST, IT MAY TAKE 1-2 WEEKS TO SCHEDULE/PROCESS THE REFERRAL. IF YOU HAVE NOT HEARD FROM US/SPECIALIST IN TWO WEEKS, PLEASE GIVE [place] A CALL AT [phone].   THE PATIENT IS ENCOURAGED TO PRACTICE SOCIAL DISTANCING DUE TO THE COVID-19 PANDEMIC.

## 2022-09-10 LAB — BASIC METABOLIC PANEL
BUN/Creatinine Ratio: 12 (ref 10–24)
BUN: 18 mg/dL (ref 8–27)
CO2: 18 mmol/L — ABNORMAL LOW (ref 20–29)
Calcium: 9.5 mg/dL (ref 8.6–10.2)
Chloride: 102 mmol/L (ref 96–106)
Creatinine, Ser: 1.55 mg/dL — ABNORMAL HIGH (ref 0.76–1.27)
Glucose: 103 mg/dL — ABNORMAL HIGH (ref 70–99)
Potassium: 4.6 mmol/L (ref 3.5–5.2)
Sodium: 139 mmol/L (ref 134–144)
eGFR: 48 mL/min/{1.73_m2} — ABNORMAL LOW (ref >59)

## 2022-09-10 LAB — LIPID PANEL
Chol/HDL Ratio: 3 ratio (ref 0.0–5.0)
Cholesterol, Total: 137 mg/dL (ref 100–199)
HDL: 45 mg/dL (ref >39)
LDL Chol Calc (NIH): 57 mg/dL (ref 0–99)
Triglycerides: 220 mg/dL — ABNORMAL HIGH (ref 0–149)
VLDL Cholesterol Cal: 35 mg/dL (ref 5–40)

## 2022-09-10 LAB — HEMOGLOBIN A1C
Est. average glucose Bld gHb Est-mCnc: 128 mg/dL
Hgb A1c MFr Bld: 6.1 % — ABNORMAL HIGH (ref 4.8–5.6)

## 2022-09-14 ENCOUNTER — Telehealth: Payer: Self-pay

## 2022-09-14 NOTE — Progress Notes (Addendum)
As of 09/09/2022, patient picked up 5 boxes of Tresiba from PCP office during visit with Arnette Felts, FNP.  Faxing reorder form to [employer] for Rybelsus 14 mg.    Billee Cashing, CMA Clinical Pharmacist Assistant [phone]

## 2022-09-15 ENCOUNTER — Other Ambulatory Visit (INDEPENDENT_AMBULATORY_CARE_PROVIDER_SITE_OTHER): Payer: Self-pay | Admitting: Gastroenterology

## 2022-09-15 DIAGNOSIS — K219 Gastro-esophageal reflux disease without esophagitis: Secondary | ICD-10-CM

## 2022-09-21 ENCOUNTER — Ambulatory Visit: Payer: Medicare Other | Admitting: Physician Assistant

## 2022-09-29 ENCOUNTER — Ambulatory Visit: Payer: Medicare Other | Admitting: Physician Assistant

## 2022-09-30 ENCOUNTER — Ambulatory Visit: Payer: Medicare Other | Admitting: Urology

## 2022-09-30 ENCOUNTER — Other Ambulatory Visit: Payer: Self-pay | Admitting: Physician Assistant

## 2022-10-04 ENCOUNTER — Ambulatory Visit (INDEPENDENT_AMBULATORY_CARE_PROVIDER_SITE_OTHER): Payer: Medicare Other | Admitting: *Deleted

## 2022-10-04 DIAGNOSIS — E1169 Type 2 diabetes mellitus with other specified complication: Secondary | ICD-10-CM

## 2022-10-04 DIAGNOSIS — I152 Hypertension secondary to endocrine disorders: Secondary | ICD-10-CM

## 2022-10-04 NOTE — Chronic Care Management (AMB) (Signed)
Chronic Care Management   CCM RN Visit Note  10/04/2022 Name: Robert Walters MRN: [id number] DOB: 25-May-1955  Subjective: Robert Walters is a 67 y.o. year old male who is a primary care patient of Arnette Felts, FNP. The patient was referred to the Chronic Care Management team for assistance with care management needs subsequent to provider initiation of CCM services and plan of care.    Today's Visit:  Engaged with patient by telephone for follow up visit.        Goals Addressed             This Visit's Progress    CCM (DIABETES) EXPECTED OUTCOME:  MONITOR, SELF-MANAGE AND REDUCE SYMPTOMS OF DIABETES       Current Barriers:  Knowledge Deficits related to Diabetes management Chronic Disease Management support and education needs related to Diabetes, diet No Advanced Directives in place- pt declines information Patient reports he checks CBG once daily fasting with readings 130-140 with occasional reading near 150 Recent AIC on 09/09/22 6.1  Planned Interventions: Reviewed medications with patient and discussed importance of medication adherence;        Counseled on importance of regular laboratory monitoring as prescribed;        Advised patient, providing education and rationale, to check cbg once daily and record        call provider for findings outside established parameters;       Review of patient status, including review of consultants reports, relevant laboratory and other test results, and medications completed;       Reviewed importance of exercise Reviewed carbohydrate modified diet   Symptom Management: Take medications as prescribed   Attend all scheduled provider appointments Call pharmacy for medication refills 3-7 days in advance of running out of medications Perform all self care activities independently  Perform IADL's (shopping, preparing meals, housekeeping, managing finances) independently Call provider office for new concerns or questions  check blood  sugar at prescribed times: once daily check feet daily for cuts, sores or redness enter blood sugar readings and medication or insulin into daily log take the blood sugar log to all doctor visits take the blood sugar meter to all doctor visits trim toenails straight across drink 6 to 8 glasses of water each day eat fish at least once per week fill half of plate with vegetables limit fast food meals to no more than 1 per week manage portion size prepare main meal at home 3 to 5 days each week read food labels for fat, fiber, carbohydrates and portion size set a realistic goal Try to exercise daily  Follow Up Plan: Telephone follow up appointment with care management team member scheduled for:  12/28/22 at 130 pm       CCM (HYPERTENSION) EXPECTED OUTCOME: MONITOR, SELF-MANAGE AND REDUCE SYMPTOMS OF HYPERTENSION       Current Barriers:  Knowledge Deficits related to Hypertension management Chronic Disease Management support and education needs related to Hypertension, diet No Advanced Directives in place- pt declines information Patient reports he lives alone, has neighbor he can call on if needed, does not cook, mostly eats out Patient reports he does not monitor blood pressure and does not feel this is necessary Patient reports he has all medication and taking as prescribed  Planned Interventions: Reviewed low sodium diet and importance of adherence Reviewed importance of reading food labels Encouraged pt to exercise Encouraged pt to monitor blood pressure and keep a log  Symptom Management: Take medications as prescribed  Attend all scheduled provider appointments Call pharmacy for medication refills 3-7 days in advance of running out of medications Perform all self care activities independently  Perform IADL's (shopping, preparing meals, housekeeping, managing finances) independently Call provider office for new concerns or questions  check blood pressure weekly choose a  place to take my blood pressure (home, clinic or office, retail store) write blood pressure results in a log or diary learn about high blood pressure keep a blood pressure log take blood pressure log to all doctor appointments call doctor for signs and symptoms of high blood pressure keep all doctor appointments take medications for blood pressure exactly as prescribed begin an exercise program report new symptoms to your doctor eat more whole grains, fruits and vegetables, lean meats and healthy fats Follow low sodium diet- read food labels for sodium content Limit fast food  Follow Up Plan: Telephone follow up appointment with care management team member scheduled for:    12/28/22 at 130 pm          Plan:Telephone follow up appointment with care management team member scheduled for:  12/28/22 at 130 pm  [name] [facility], BSN RN Case Manager [facility] [phone]

## 2022-10-04 NOTE — Patient Instructions (Signed)
Please call the care guide team at [phone] if you need to cancel or reschedule your appointment.   If you are experiencing a Mental Health or Behavioral Health Crisis or need someone to talk to, please call the Suicide and Crisis Lifeline: 988 call the [place] National Suicide Prevention Lifeline: (757) 452-1419 or TTY: 386 576 2949 TTY 705-028-9151) to talk to a trained counselor call 1-800-273-TALK (toll free, 24 hour hotline) go to [facility] [address], [place] [phone]) call the [facility]: [phone] call 911   Following is a copy of the CCM Program Consent:  CCM service includes personalized support from designated clinical staff supervised by the physician, including individualized plan of care and coordination with other care providers 24/7 contact phone numbers for assistance for urgent and routine care needs. Service will only be billed when office clinical staff spend 20 minutes or more in a month to coordinate care. Only one practitioner may furnish and bill the service in a calendar month. The patient may stop CCM services at amy time (effective at the end of the month) by phone call to the office staff. The patient will be responsible for cost sharing (co-pay) or up to 20% of the service fee (after annual deductible is met)  Following is a copy of your full provider care plan:   Goals Addressed             This Visit's Progress    CCM (DIABETES) EXPECTED OUTCOME:  MONITOR, SELF-MANAGE AND REDUCE SYMPTOMS OF DIABETES       Current Barriers:  Knowledge Deficits related to Diabetes management Chronic Disease Management support and education needs related to Diabetes, diet No Advanced Directives in place- pt declines information Patient reports he checks CBG once daily fasting with readings 130-140 with occasional reading near 150 Recent AIC on 09/09/22 6.1  Planned Interventions: Reviewed medications  with patient and discussed importance of medication adherence;        Counseled on importance of regular laboratory monitoring as prescribed;        Advised patient, providing education and rationale, to check cbg once daily and record        call provider for findings outside established parameters;       Review of patient status, including review of consultants reports, relevant laboratory and other test results, and medications completed;       Reviewed importance of exercise Reviewed carbohydrate modified diet   Symptom Management: Take medications as prescribed   Attend all scheduled provider appointments Call pharmacy for medication refills 3-7 days in advance of running out of medications Perform all self care activities independently  Perform IADL's (shopping, preparing meals, housekeeping, managing finances) independently Call provider office for new concerns or questions  check blood sugar at prescribed times: once daily check feet daily for cuts, sores or redness enter blood sugar readings and medication or insulin into daily log take the blood sugar log to all doctor visits take the blood sugar meter to all doctor visits trim toenails straight across drink 6 to 8 glasses of water each day eat fish at least once per week fill half of plate with vegetables limit fast food meals to no more than 1 per week manage portion size prepare main meal at home 3 to 5 days each week read food labels for fat, fiber, carbohydrates and portion size set a realistic goal Try to exercise daily  Follow Up Plan: Telephone follow up appointment with care management team  member scheduled for:  12/28/22 at 130 pm       CCM (HYPERTENSION) EXPECTED OUTCOME: MONITOR, SELF-MANAGE AND REDUCE SYMPTOMS OF HYPERTENSION       Current Barriers:  Knowledge Deficits related to Hypertension management Chronic Disease Management support and education needs related to Hypertension, diet No Advanced  Directives in place- pt declines information Patient reports he lives alone, has neighbor he can call on if needed, does not cook, mostly eats out Patient reports he does not monitor blood pressure and does not feel this is necessary Patient reports he has all medication and taking as prescribed  Planned Interventions: Reviewed low sodium diet and importance of adherence Reviewed importance of reading food labels Encouraged pt to exercise Encouraged pt to monitor blood pressure and keep a log  Symptom Management: Take medications as prescribed   Attend all scheduled provider appointments Call pharmacy for medication refills 3-7 days in advance of running out of medications Perform all self care activities independently  Perform IADL's (shopping, preparing meals, housekeeping, managing finances) independently Call provider office for new concerns or questions  check blood pressure weekly choose a place to take my blood pressure (home, clinic or office, retail store) write blood pressure results in a log or diary learn about high blood pressure keep a blood pressure log take blood pressure log to all doctor appointments call doctor for signs and symptoms of high blood pressure keep all doctor appointments take medications for blood pressure exactly as prescribed begin an exercise program report new symptoms to your doctor eat more whole grains, fruits and vegetables, lean meats and healthy fats Follow low sodium diet- read food labels for sodium content Limit fast food  Follow Up Plan: Telephone follow up appointment with care management team member scheduled for:    12/28/22 at 130 pm          The patient verbalized understanding of instructions, educational materials, and care plan provided today and DECLINED offer to receive copy of patient instructions, educational materials, and care plan.  Telephone follow up appointment with care management team member scheduled for:   12/28/22 at 130 pm

## 2022-10-06 ENCOUNTER — Telehealth: Payer: Self-pay

## 2022-10-07 NOTE — Progress Notes (Signed)
Patient aware Rybelsus and Novofine needles have arrived to PCP office. Novo Nordisk patient assistance.   Billee Cashing, CMA Clinical Pharmacist Assistant [phone]

## 2022-10-18 ENCOUNTER — Ambulatory Visit: Payer: Medicare Other | Admitting: Physician Assistant

## 2022-10-20 DIAGNOSIS — K080 Exfoliation of teeth due to systemic causes: Secondary | ICD-10-CM | POA: Diagnosis not present

## 2022-10-31 DIAGNOSIS — Z794 Long term (current) use of insulin: Secondary | ICD-10-CM | POA: Diagnosis not present

## 2022-10-31 DIAGNOSIS — E1159 Type 2 diabetes mellitus with other circulatory complications: Secondary | ICD-10-CM | POA: Diagnosis not present

## 2022-10-31 DIAGNOSIS — I10 Essential (primary) hypertension: Secondary | ICD-10-CM

## 2022-11-02 ENCOUNTER — Encounter: Payer: Self-pay | Admitting: Pharmacist

## 2022-11-02 ENCOUNTER — Ambulatory Visit (INDEPENDENT_AMBULATORY_CARE_PROVIDER_SITE_OTHER): Payer: Medicare Other | Admitting: Gastroenterology

## 2022-11-02 NOTE — Progress Notes (Signed)
Patient previously followed by [facility] pharmacist. Per clinical review, no pharmacist appointment needed at this time. Will make pharmacy patient advocate team aware of medication assistance program enrollment for re-enrollment for 2025.Care guide directed to contact patient and cancel appointment and notify pharmacy team of any patient concerns.

## 2022-11-15 DIAGNOSIS — I739 Peripheral vascular disease, unspecified: Secondary | ICD-10-CM | POA: Diagnosis not present

## 2022-12-01 ENCOUNTER — Ambulatory Visit: Payer: Medicare Other | Admitting: Physician Assistant

## 2022-12-02 ENCOUNTER — Ambulatory Visit: Payer: Medicare Other | Admitting: Urology

## 2022-12-16 ENCOUNTER — Telehealth (INDEPENDENT_AMBULATORY_CARE_PROVIDER_SITE_OTHER): Payer: Self-pay | Admitting: *Deleted

## 2022-12-16 NOTE — Telephone Encounter (Signed)
Pt requesting refill on omeprazole 40mg  one every day. Last seen in office 2022. Had TCS oct 2022. Has up coming appt in October. I let him know med was denied due to not being seen since 2022. He is asking if he can have enough til his appt in October.   [place] [facility]   [phone]

## 2022-12-17 ENCOUNTER — Other Ambulatory Visit (INDEPENDENT_AMBULATORY_CARE_PROVIDER_SITE_OTHER): Payer: Self-pay | Admitting: Gastroenterology

## 2022-12-17 DIAGNOSIS — K219 Gastro-esophageal reflux disease without esophagitis: Secondary | ICD-10-CM

## 2022-12-17 MED ORDER — OMEPRAZOLE 40 MG PO CPDR
40.0000 mg | DELAYED_RELEASE_CAPSULE | Freq: Every day | ORAL | 0 refills | Status: AC
Start: 2022-12-17 — End: ?

## 2022-12-17 NOTE — Addendum Note (Signed)
Addended by: Dolores Frame on: 12/17/2022 10:31 AM   Modules accepted: Orders

## 2022-12-17 NOTE — Telephone Encounter (Signed)
Pt notified on vm

## 2022-12-17 NOTE — Telephone Encounter (Signed)
Medication sent to pharmacy for 3 months

## 2022-12-20 ENCOUNTER — Other Ambulatory Visit: Payer: Self-pay | Admitting: Physician Assistant

## 2022-12-21 ENCOUNTER — Other Ambulatory Visit: Payer: Self-pay

## 2022-12-21 MED ORDER — EZETIMIBE 10 MG PO TABS: 10.0000 mg | ORAL_TABLET | Freq: Every day | ORAL | 1 refills | Status: DC

## 2022-12-28 ENCOUNTER — Other Ambulatory Visit: Payer: Medicare Other | Admitting: *Deleted

## 2022-12-28 ENCOUNTER — Telehealth: Payer: Medicare Other

## 2022-12-28 ENCOUNTER — Encounter: Payer: Self-pay | Admitting: *Deleted

## 2022-12-28 NOTE — Patient Outreach (Signed)
Care Management   Visit Note  12/28/2022 Name: Robert Walters MRN: [id number] DOB: May 02, 1956  Subjective: URI Robert Walters is a 67 y.o. year old male who is a primary care patient of Arnette Felts, FNP. The Care Management team was consulted for assistance.      Engaged with patient spoke with patient by telephone for follow up   Goals Addressed             This Visit's Progress    CCM (DIABETES) EXPECTED OUTCOME:  MONITOR, SELF-MANAGE AND REDUCE SYMPTOMS OF DIABETES       Current Barriers:  Knowledge Deficits related to Diabetes management Chronic Disease Management support and education needs related to Diabetes, diet No Advanced Directives in place- pt declines information Patient reports he checks CBG once daily fasting with readings 119-135  Recent AIC on 09/09/22 6.1 No new concerns reported per patient  Planned Interventions: Reviewed medications with patient and discussed importance of medication adherence;        Counseled on importance of regular laboratory monitoring as prescribed;        Advised patient, providing education and rationale, to check cbg once daily and record        call provider for findings outside established parameters;       Review of patient status, including review of consultants reports, relevant laboratory and other test results, and medications completed;       Reinforced importance of exercise Reinforced carbohydrate modified diet  Reviewed plan of care with patient including case closure  Symptom Management: Take medications as prescribed   Attend all scheduled provider appointments Call pharmacy for medication refills 3-7 days in advance of running out of medications Perform all self care activities independently  Perform IADL's (shopping, preparing meals, housekeeping, managing finances) independently Call provider office for new concerns or questions  check blood sugar at prescribed times: once daily check feet daily for cuts, sores  or redness enter blood sugar readings and medication or insulin into daily log take the blood sugar log to all doctor visits take the blood sugar meter to all doctor visits trim toenails straight across drink 6 to 8 glasses of water each day eat fish at least once per week fill half of plate with vegetables limit fast food meals to no more than 1 per week manage portion size prepare main meal at home 3 to 5 days each week read food labels for fat, fiber, carbohydrates and portion size set a realistic goal Try to exercise daily Case closure         CCM (HYPERTENSION) EXPECTED OUTCOME: MONITOR, SELF-MANAGE AND REDUCE SYMPTOMS OF HYPERTENSION       Current Barriers:  Knowledge Deficits related to Hypertension management Chronic Disease Management support and education needs related to Hypertension, diet No Advanced Directives in place- pt declines information Patient reports he lives alone, has neighbor he can call on if needed, does not cook, mostly eats out Patient reports he does not monitor blood pressure and does not feel this is necessary Patient reports he has all medication and taking as prescribed  Planned Interventions: Reviewed low sodium diet and importance of adherence Reviewed importance of reading food labels Reinforced with importance of exercising Encouraged pt to monitor blood pressure and keep a log  Symptom Management: Take medications as prescribed   Attend all scheduled provider appointments Call pharmacy for medication refills 3-7 days in advance of running out of medications Perform all self care activities independently  Perform IADL's (  shopping, preparing meals, housekeeping, managing finances) independently Call provider office for new concerns or questions  check blood pressure weekly choose a place to take my blood pressure (home, clinic or office, retail store) write blood pressure results in a log or diary learn about high blood pressure keep a  blood pressure log take blood pressure log to all doctor appointments call doctor for signs and symptoms of high blood pressure keep all doctor appointments take medications for blood pressure exactly as prescribed begin an exercise program report new symptoms to your doctor eat more whole grains, fruits and vegetables, lean meats and healthy fats Follow low sodium diet- read food labels for sodium content Limit fast food            Plan: No further follow up required: case closure  [name] [facility], BSN [place]/ Ambulatory Care Management [phone]

## 2022-12-28 NOTE — Patient Instructions (Signed)
Visit Information  Thank you for taking time to visit with me today. Please don't hesitate to contact me if I can be of assistance to you before our next scheduled telephone appointment.  Following are the goals we discussed today:   Goals Addressed             This Visit's Progress    CCM (DIABETES) EXPECTED OUTCOME:  MONITOR, SELF-MANAGE AND REDUCE SYMPTOMS OF DIABETES       Current Barriers:  Knowledge Deficits related to Diabetes management Chronic Disease Management support and education needs related to Diabetes, diet No Advanced Directives in place- pt declines information Patient reports he checks CBG once daily fasting with readings 119-135  Recent AIC on 09/09/22 6.1 No new concerns reported per patient  Planned Interventions: Reviewed medications with patient and discussed importance of medication adherence;        Counseled on importance of regular laboratory monitoring as prescribed;        Advised patient, providing education and rationale, to check cbg once daily and record        call provider for findings outside established parameters;       Review of patient status, including review of consultants reports, relevant laboratory and other test results, and medications completed;       Reinforced importance of exercise Reinforced carbohydrate modified diet  Reviewed plan of care with patient including case closure  Symptom Management: Take medications as prescribed   Attend all scheduled provider appointments Call pharmacy for medication refills 3-7 days in advance of running out of medications Perform all self care activities independently  Perform IADL's (shopping, preparing meals, housekeeping, managing finances) independently Call provider office for new concerns or questions  check blood sugar at prescribed times: once daily check feet daily for cuts, sores or redness enter blood sugar readings and medication or insulin into daily log take the blood sugar  log to all doctor visits take the blood sugar meter to all doctor visits trim toenails straight across drink 6 to 8 glasses of water each day eat fish at least once per week fill half of plate with vegetables limit fast food meals to no more than 1 per week manage portion size prepare main meal at home 3 to 5 days each week read food labels for fat, fiber, carbohydrates and portion size set a realistic goal Try to exercise daily Case closure         CCM (HYPERTENSION) EXPECTED OUTCOME: MONITOR, SELF-MANAGE AND REDUCE SYMPTOMS OF HYPERTENSION       Current Barriers:  Knowledge Deficits related to Hypertension management Chronic Disease Management support and education needs related to Hypertension, diet No Advanced Directives in place- pt declines information Patient reports he lives alone, has neighbor he can call on if needed, does not cook, mostly eats out Patient reports he does not monitor blood pressure and does not feel this is necessary Patient reports he has all medication and taking as prescribed  Planned Interventions: Reviewed low sodium diet and importance of adherence Reviewed importance of reading food labels Reinforced with importance of exercising Encouraged pt to monitor blood pressure and keep a log  Symptom Management: Take medications as prescribed   Attend all scheduled provider appointments Call pharmacy for medication refills 3-7 days in advance of running out of medications Perform all self care activities independently  Perform IADL's (shopping, preparing meals, housekeeping, managing finances) independently Call provider office for new concerns or questions  check blood pressure weekly choose  a place to take my blood pressure (home, clinic or office, retail store) write blood pressure results in a log or diary learn about high blood pressure keep a blood pressure log take blood pressure log to all doctor appointments call doctor for signs and  symptoms of high blood pressure keep all doctor appointments take medications for blood pressure exactly as prescribed begin an exercise program report new symptoms to your doctor eat more whole grains, fruits and vegetables, lean meats and healthy fats Follow low sodium diet- read food labels for sodium content Limit fast food            Please call the care guide team at [phone] if you need to cancel or reschedule your appointment.   If you are experiencing a Mental Health or Behavioral Health Crisis or need someone to talk to, please call the Suicide and Crisis Lifeline: 988 call the [place] National Suicide Prevention Lifeline: 734-647-8173 or TTY: 209 492 9838 TTY (217)567-3251) to talk to a trained counselor call 1-800-273-TALK (toll free, 24 hour hotline) go to [facility] [address], [place] [phone]) call the [facility] Line: [phone] call 911   The patient verbalized understanding of instructions, educational materials, and care plan provided today and DECLINED offer to receive copy of patient instructions, educational materials, and care plan.   No further follow up required: case closure  [name] [facility], BSN / Ambulatory Care Management [phone]

## 2022-12-29 ENCOUNTER — Telehealth: Payer: Self-pay | Admitting: Cardiovascular Disease

## 2022-12-29 MED ORDER — VALSARTAN 320 MG PO TABS: 320.0000 mg | ORAL_TABLET | Freq: Every day | ORAL | 0 refills | Status: DC

## 2022-12-29 MED ORDER — METOPROLOL TARTRATE 25 MG PO TABS: ORAL_TABLET | ORAL | 0 refills | Status: DC

## 2022-12-29 NOTE — Telephone Encounter (Signed)
PT RX sent in for him and he will keep his appt with in [place] 02/11/23.

## 2022-12-29 NOTE — Telephone Encounter (Signed)
Pt would like to speak with a nurse about some medicine he states he is about to run out of. Please advise

## 2023-01-10 NOTE — Progress Notes (Addendum)
Madelaine Bhat, CMA,acting as a Neurosurgeon for Arnette Felts, FNP.,have documented all relevant documentation on the behalf of Arnette Felts, FNP,as directed by  Arnette Felts, FNP while in the presence of Arnette Felts, FNP.  Subjective:  Patient ID: Robert Walters , male    DOB: 1955-07-19 , 67 y.o.   MRN: [id number]  Chief Complaint  Patient presents with   Diabetes   Hypertension    HPI  Patient presents today for a DM follow up, patient reports compliance with medication. Patient denies any chest pain, SOB, or headaches. Patient has no concerns today. He is due to see urology next month and a colonoscopy next month  Hypertension This is a chronic problem. The current episode started more than 1 year ago. The problem is unchanged. The problem is controlled. Pertinent negatives include no anxiety, chest pain, headaches or palpitations. There are no associated agents to hypertension. Risk factors for coronary artery disease include obesity and sedentary lifestyle. There are no compliance problems.  There is no history of angina. There is no history of chronic renal disease.  Diabetes He presents for his follow-up diabetic visit. He has type 2 diabetes mellitus. His disease course has been stable. There are no hypoglycemic associated symptoms. Pertinent negatives for hypoglycemia include no dizziness or headaches. There are no diabetic associated symptoms. Pertinent negatives for diabetes include no chest pain, no fatigue, no polydipsia, no polyphagia and no polyuria. There are no hypoglycemic complications. Symptoms are stable. Diabetic complications include heart disease and nephropathy. Risk factors for coronary artery disease include diabetes mellitus, sedentary lifestyle, male sex and obesity. Current diabetic treatment includes oral agent (triple therapy) (Continues with tresiba, jardiance and rybelsus). He is compliant with treatment all of the time. He is following a generally unhealthy diet. He  has not had a previous visit with a dietitian. He rarely participates in exercise. (Blood sugars in 135-145 occasionally will be 160) An ACE inhibitor/angiotensin II receptor blocker is being taken. He does not see a podiatrist.Eye exam is not current (He will see the eye doctor on October 7th).     Past Medical History:  Diagnosis Date   Acid reflux disease    Bipolar 1 disorder, mixed (HCC)    BPH (benign prostatic hypertrophy)    CVA (cerebral vascular accident) (HCC) 07/2015   Gastritis    H/O hiatal hernia    Hyperlipidemia    Hypertension    Hypertensive heart disease with chronic diastolic congestive heart failure (HCC) 01/19/2023   Normal nuclear stress test Dec 2011   Obesity    TIA (transient ischemic attack) 05/15/2012   "they think I've had one this am @ 0130" (05/15/2012)   Type II diabetes mellitus (HCC)    Vertigo      Family History  Problem Relation Age of Onset   Hyperlipidemia Father        in a nursing home   Colon cancer Father    Stroke Mother 69     Current Outpatient Medications:    Ascorbic Acid (VITAMIN C) 500 MG CAPS, Take 500 mg by mouth daily. , Disp: , Rfl:    aspirin EC 81 MG tablet, Take 1 tablet (81 mg total) by mouth daily., Disp: , Rfl:    atorvastatin (LIPITOR) 80 MG tablet, TAKE 1 TABLET BY MOUTH DAILY, Disp: 90 tablet, Rfl: 1   b complex vitamins tablet, Take 1 tablet by mouth daily. , Disp: , Rfl:    cholecalciferol (VITAMIN D3) 25 MCG (1000  UNIT) tablet, Take 2,000 Units by mouth daily., Disp: , Rfl:    Coenzyme Q10 (COQ10) 200 MG CAPS, Take 200 mg by mouth daily., Disp: , Rfl:    empagliflozin (JARDIANCE) 25 MG TABS tablet, Take 1 tablet (25 mg total) by mouth daily before breakfast., Disp: 30 tablet, Rfl: 2   ezetimibe (ZETIA) 10 MG tablet, Take 1 tablet (10 mg total) by mouth daily., Disp: 90 tablet, Rfl: 1   FLUoxetine (PROZAC) 40 MG capsule, TAKE ONE CAPSULE BY MOUTH EVERY MORNING, Disp: 90 capsule, Rfl: 0   Garlic 500 MG CAPS, Take  500 mg by mouth daily., Disp: , Rfl:    glucose blood (ONETOUCH ULTRA) test strip, USE 1 STRIP TO CHECK GLUCOSE 3 TIMES DAILY, Disp: 300 strip, Rfl: 3   insulin degludec (TRESIBA FLEXTOUCH) 100 UNIT/ML FlexTouch Pen, Inject 55 Units into the skin daily., Disp: 15 mL, Rfl: 1   Insulin Pen Needle (PEN NEEDLES) 32G X 4 MM MISC, Use as directed, Disp: 100 each, Rfl: 2   Menthol-Methyl Salicylate (ARTHRITIS HOT) 10-15 % CREA, Apply 1 application  topically daily as needed (pain)., Disp: , Rfl:    Menthol-Methyl Salicylate (ICY HOT) 10-30 % STCK, Apply 1 application topically 3 (three) times daily as needed (pain)., Disp: , Rfl:    metoprolol tartrate (LOPRESSOR) 25 MG tablet, TAKE 1 TABLET BY MOUTH 2 TIMES DAILY., Disp: 180 tablet, Rfl: 0   Multiple Vitamin (MULTIVITAMIN WITH MINERALS) TABS tablet, Take 1 tablet by mouth daily., Disp: , Rfl:    Omega-3 Fatty Acids (FISH OIL) 1000 MG CAPS, Take 2,000 mg by mouth daily., Disp: , Rfl:    omeprazole (PRILOSEC) 40 MG capsule, Take 1 capsule (40 mg total) by mouth daily., Disp: 90 capsule, Rfl: 0   Semaglutide (RYBELSUS) 14 MG TABS, Take 1 tablet (14 mg total) by mouth daily at 6 (six) AM., Disp: 90 tablet, Rfl: 0   sildenafil (VIAGRA) 50 MG tablet, Take 1 tablet (50 mg total) by mouth daily as needed for erectile dysfunction., Disp: 10 tablet, Rfl: 11   tadalafil (CIALIS) 5 MG tablet, Take 1 tablet (5 mg total) by mouth daily as needed for erectile dysfunction., Disp: 30 tablet, Rfl: 11   tiZANidine (ZANAFLEX) 4 MG tablet, One by mouth twice a day as needed for neck spasm, Disp: 30 tablet, Rfl: 2   trospium (SANCTURA) 20 MG tablet, Take 1 tablet (20 mg total) by mouth 2 (two) times daily., Disp: 60 tablet, Rfl: 11   valsartan (DIOVAN) 320 MG tablet, Take 1 tablet (320 mg total) by mouth daily., Disp: 90 tablet, Rfl: 0   Finerenone (KERENDIA) 10 MG TABS, Take 1 tablet (10 mg total) by mouth daily., Disp: 90 tablet, Rfl: 1   lamoTRIgine (LAMICTAL) 150 MG  tablet, Take 1 tablet (150 mg total) by mouth 2 (two) times daily., Disp: 60 tablet, Rfl: 1   LORazepam (ATIVAN) 1 MG tablet, TAKE 1 TABLET BY MOUTH 3 TIMES DAILY AS NEEDED FOR ANXIETY. GENERIC EQUIVALENT FOR ATIVAN, Disp: 90 tablet, Rfl: 0   metFORMIN (GLUCOPHAGE) 1000 MG tablet, TAKE 1 TABLET BY MOUTH TWICE DAILY WITH MEALS, Disp: 180 tablet, Rfl: 1  Current Facility-Administered Medications:    ciprofloxacin (CIPRO) tablet 500 mg, 500 mg, Oral, Once, Bjorn Pippin, MD   No Known Allergies   Review of Systems  Constitutional:  Negative for fatigue.  Respiratory: Negative.    Cardiovascular:  Negative for chest pain, palpitations and leg swelling.  Endocrine: Negative for polydipsia, polyphagia and  polyuria.  Neurological:  Negative for dizziness and headaches.  Psychiatric/Behavioral: Negative.       Today's Vitals   01/11/23 1152  BP: 102/70  Pulse: 92  Temp: 97.7 F (36.5 C)  Weight: 223 lb (101.2 kg)  Height: 5\' 9"  (1.753 m)  PainSc: 0-No pain   Body mass index is 32.93 kg/m.  Wt Readings from Last 3 Encounters:  01/11/23 223 lb (101.2 kg)  09/09/22 223 lb (101.2 kg)  06/15/22 225 lb (102.1 kg)     Objective:  Physical Exam Vitals reviewed.  Constitutional:      General: He is not in acute distress.    Appearance: Normal appearance. He is obese.  Eyes:     Extraocular Movements: Extraocular movements intact.     Conjunctiva/sclera: Conjunctivae normal.     Pupils: Pupils are equal, round, and reactive to light.  Cardiovascular:     Rate and Rhythm: Normal rate and regular rhythm.     Pulses: Normal pulses.     Heart sounds: Normal heart sounds. No murmur heard. Pulmonary:     Effort: Pulmonary effort is normal. No respiratory distress.     Breath sounds: Normal breath sounds. No wheezing.  Skin:    General: Skin is warm and dry.     Capillary Refill: Capillary refill takes less than 2 seconds.  Neurological:     General: No focal deficit present.      Mental Status: He is alert and oriented to person, place, and time.     Cranial Nerves: No cranial nerve deficit.     Motor: No weakness.  Psychiatric:        Mood and Affect: Mood normal.        Behavior: Behavior normal.        Thought Content: Thought content normal.        Judgment: Judgment normal.         Assessment And Plan:  Type 2 diabetes mellitus with hyperlipidemia (HCC) Assessment & Plan: HgbA1c is improved at last visit. Continue current medications. Given his pen needles from [employer] and samples of kerendia until approved  Orders: -     Hemoglobin A1c  Type 2 diabetes mellitus with stage 3a chronic kidney disease, without long-term current use of insulin (HCC) -     metFORMIN HCl; TAKE 1 TABLET BY MOUTH TWICE DAILY WITH MEALS  Dispense: 180 tablet; Refill: 1 -     [name]; Take 1 tablet (10 mg total) by mouth daily.  Dispense: 90 tablet; Refill: 1 -     CMP14+EGFR  Hypertensive heart disease with chronic diastolic congestive heart failure (HCC) Assessment & Plan: .  Blood pressure is well-controlled.  Continue current medications.  No issues or concerns with his congestive heart failure, no swelling noted.  Orders: -     CMP14+EGFR -     BMP8+eGFR; Future  Mixed hyperlipidemia -     Lipid panel -     CMP14+EGFR  Need for influenza vaccination Assessment & Plan: Influenza vaccine administered Encouraged to take Tylenol as needed for fever or muscle aches.   Orders: -     Flu Vaccine Trivalent High Dose (Fluad)  Need for COVID-19 vaccine Assessment & Plan: Covid 19 vaccine given in office observed for 15 minutes without any adverse reaction. He wanted both of his covid and flu vaccine today   Orders: -     PFIZER Comirnaty(GRAY TOP)COVID-19 Vaccine  Class 1 drug-induced obesity with serious comorbidity and body mass  index (BMI) of 32.0 to 32.9 in adult Assessment & Plan: he is encouraged to strive for BMI less than 30 to decrease cardiac  risk. Advised to aim for at least 150 minutes of exercise per week.      Return for keep her next appt as scheduled .   Patient was given opportunity to ask questions. Patient verbalized understanding of the plan and was able to repeat key elements of the plan. All questions were answered to their satisfaction.    Jeanell Sparrow, FNP, have reviewed all documentation for this visit. The documentation on 01/19/23 for the exam, diagnosis, procedures, and orders are all accurate and complete.   IF YOU HAVE BEEN REFERRED TO A SPECIALIST, IT MAY TAKE 1-2 WEEKS TO SCHEDULE/PROCESS THE REFERRAL. IF YOU HAVE NOT HEARD FROM US/SPECIALIST IN TWO WEEKS, PLEASE GIVE [place] A CALL AT [phone].

## 2023-01-11 ENCOUNTER — Encounter: Payer: Self-pay | Admitting: Nurse Practitioner

## 2023-01-11 ENCOUNTER — Ambulatory Visit: Payer: Medicare Other | Admitting: Nurse Practitioner

## 2023-01-11 VITALS — BP 102/70 | HR 92 | Temp 97.7°F | Ht 69.0 in | Wt 223.0 lb

## 2023-01-11 DIAGNOSIS — I130 Hypertensive heart and chronic kidney disease with heart failure and stage 1 through stage 4 chronic kidney disease, or unspecified chronic kidney disease: Secondary | ICD-10-CM

## 2023-01-11 DIAGNOSIS — I5032 Chronic diastolic (congestive) heart failure: Secondary | ICD-10-CM | POA: Diagnosis not present

## 2023-01-11 DIAGNOSIS — E1122 Type 2 diabetes mellitus with diabetic chronic kidney disease: Secondary | ICD-10-CM

## 2023-01-11 DIAGNOSIS — E785 Hyperlipidemia, unspecified: Secondary | ICD-10-CM

## 2023-01-11 DIAGNOSIS — E782 Mixed hyperlipidemia: Secondary | ICD-10-CM

## 2023-01-11 DIAGNOSIS — E1169 Type 2 diabetes mellitus with other specified complication: Secondary | ICD-10-CM

## 2023-01-11 DIAGNOSIS — N183 Chronic kidney disease, stage 3 unspecified: Secondary | ICD-10-CM | POA: Insufficient documentation

## 2023-01-11 DIAGNOSIS — Z6832 Body mass index (BMI) 32.0-32.9, adult: Secondary | ICD-10-CM

## 2023-01-11 DIAGNOSIS — I110 Hypertensive heart disease with heart failure: Secondary | ICD-10-CM

## 2023-01-11 DIAGNOSIS — Z23 Encounter for immunization: Secondary | ICD-10-CM | POA: Diagnosis not present

## 2023-01-11 DIAGNOSIS — E1159 Type 2 diabetes mellitus with other circulatory complications: Secondary | ICD-10-CM

## 2023-01-11 DIAGNOSIS — N1831 Chronic kidney disease, stage 3a: Secondary | ICD-10-CM

## 2023-01-11 DIAGNOSIS — E661 Drug-induced obesity: Secondary | ICD-10-CM

## 2023-01-11 MED ORDER — KERENDIA 10 MG PO TABS
1.0000 | ORAL_TABLET | Freq: Every day | ORAL | 1 refills | Status: DC
Start: 2023-01-11 — End: 2023-08-11

## 2023-01-11 MED ORDER — KERENDIA 10 MG PO TABS
1.0000 | ORAL_TABLET | Freq: Every day | ORAL | 1 refills | Status: DC
Start: 2023-01-11 — End: 2023-01-11

## 2023-01-11 MED ORDER — METFORMIN HCL 1000 MG PO TABS
ORAL_TABLET | ORAL | 1 refills | Status: DC
Start: 2023-01-11 — End: 2023-11-16

## 2023-01-11 NOTE — Assessment & Plan Note (Addendum)
HgbA1c is improved at last visit. Continue current medications. Given his pen needles from [employer] and samples of kerendia until approved

## 2023-01-11 NOTE — Assessment & Plan Note (Signed)
Covid 19 vaccine given in office observed for 15 minutes without any adverse reaction. He wanted both of his covid and flu vaccine today

## 2023-01-11 NOTE — Assessment & Plan Note (Signed)
he is encouraged to strive for BMI less than 30 to decrease cardiac risk. Advised to aim for at least 150 minutes of exercise per week.

## 2023-01-11 NOTE — Assessment & Plan Note (Signed)
Influenza vaccine administered Encouraged to take Tylenol as needed for fever or muscle aches.

## 2023-01-11 NOTE — Assessment & Plan Note (Addendum)
Blood pressure is well controlled, continue current medications and f/u with Cardiology. I have started him on [name] as well and will return for repeat BMP to check potassium

## 2023-01-12 LAB — LIPID PANEL
Chol/HDL Ratio: 3.2 ratio (ref 0.0–5.0)
Cholesterol, Total: 123 mg/dL (ref 100–199)
HDL: 38 mg/dL — ABNORMAL LOW (ref >39)
LDL Chol Calc (NIH): 47 mg/dL (ref 0–99)
Triglycerides: 239 mg/dL — ABNORMAL HIGH (ref 0–149)
VLDL Cholesterol Cal: 38 mg/dL (ref 5–40)

## 2023-01-12 LAB — CMP14+EGFR
ALT: 58 IU/L — ABNORMAL HIGH (ref 0–44)
AST: 43 IU/L — ABNORMAL HIGH (ref 0–40)
Albumin: 4.6 g/dL (ref 3.9–4.9)
Alkaline Phosphatase: 151 IU/L — ABNORMAL HIGH (ref 44–121)
BUN/Creatinine Ratio: 17 (ref 10–24)
BUN: 22 mg/dL (ref 8–27)
Bilirubin Total: 0.9 mg/dL (ref 0.0–1.2)
CO2: 21 mmol/L (ref 20–29)
Calcium: 9.7 mg/dL (ref 8.6–10.2)
Chloride: 100 mmol/L (ref 96–106)
Creatinine, Ser: 1.3 mg/dL — ABNORMAL HIGH (ref 0.76–1.27)
Globulin, Total: 2.2 g/dL (ref 1.5–4.5)
Glucose: 125 mg/dL — ABNORMAL HIGH (ref 70–99)
Potassium: 4.8 mmol/L (ref 3.5–5.2)
Sodium: 138 mmol/L (ref 134–144)
Total Protein: 6.8 g/dL (ref 6.0–8.5)
eGFR: 59 mL/min/{1.73_m2} — ABNORMAL LOW (ref >59)

## 2023-01-12 LAB — HEMOGLOBIN A1C
Est. average glucose Bld gHb Est-mCnc: 134 mg/dL
Hgb A1c MFr Bld: 6.3 % — ABNORMAL HIGH (ref 4.8–5.6)

## 2023-01-13 ENCOUNTER — Telehealth: Payer: Self-pay | Admitting: Physician Assistant

## 2023-01-13 ENCOUNTER — Ambulatory Visit: Payer: Medicare Other | Admitting: Physician Assistant

## 2023-01-13 NOTE — Telephone Encounter (Signed)
Patient needs refill for Lamotrigine 150mg  and Lorazepam 1mg . Had an appt today but Rosey Bath was not feeling well and was RS. Ph: [phone] Appt 9/20 Pharmacy [facility] [address] [place], [place]

## 2023-01-14 ENCOUNTER — Other Ambulatory Visit: Payer: Self-pay

## 2023-01-14 MED ORDER — LAMOTRIGINE 150 MG PO TABS: 150.0000 mg | ORAL_TABLET | Freq: Two times a day (BID) | ORAL | 1 refills | Status: DC

## 2023-01-14 MED ORDER — LORAZEPAM 1 MG PO TABS: ORAL_TABLET | ORAL | 0 refills | Status: DC

## 2023-01-14 NOTE — Telephone Encounter (Signed)
Pended.

## 2023-01-19 ENCOUNTER — Encounter: Payer: Self-pay | Admitting: Nurse Practitioner

## 2023-01-19 DIAGNOSIS — I110 Hypertensive heart disease with heart failure: Secondary | ICD-10-CM | POA: Insufficient documentation

## 2023-01-19 HISTORY — DX: Hypertensive heart disease with heart failure: I11.0

## 2023-01-19 NOTE — Assessment & Plan Note (Signed)
.    Blood pressure is well-controlled.  Continue current medications.  No issues or concerns with his congestive heart failure, no swelling noted.

## 2023-01-21 ENCOUNTER — Ambulatory Visit: Payer: Medicare Other | Admitting: Physician Assistant

## 2023-01-21 ENCOUNTER — Encounter: Payer: Self-pay | Admitting: Physician Assistant

## 2023-01-21 DIAGNOSIS — F411 Generalized anxiety disorder: Secondary | ICD-10-CM

## 2023-01-21 DIAGNOSIS — F3341 Major depressive disorder, recurrent, in partial remission: Secondary | ICD-10-CM | POA: Diagnosis not present

## 2023-01-21 MED ORDER — LAMOTRIGINE 150 MG PO TABS: 150.0000 mg | ORAL_TABLET | Freq: Two times a day (BID) | ORAL | 1 refills | Status: DC

## 2023-01-21 MED ORDER — FLUOXETINE HCL 40 MG PO CAPS: 40.0000 mg | ORAL_CAPSULE | Freq: Every morning | ORAL | 1 refills | Status: DC

## 2023-01-21 MED ORDER — LORAZEPAM 1 MG PO TABS: 1.0000 mg | ORAL_TABLET | Freq: Three times a day (TID) | ORAL | 1 refills | Status: DC | PRN

## 2023-01-21 NOTE — Progress Notes (Signed)
[facility]  Patient ID: Robert Walters,  MRN: [id number]  PCP: Arnette Felts, FNP  Date of Evaluation: 01/21/2023 Time spent:20 minutes  Chief Complaint:  Chief Complaint   Anxiety; Depression; Follow-up    HISTORY/CURRENT STATUS: HPI  For routine med check.  Overdue for appt for multiple reasons. He doesn't do much of anything, stays around the house. Doesn't feel depressed.  No extreme sadness, tearfulness, or feelings of hopelessness.  Sleeps well most of the time. ADLs and personal hygiene are normal.   Denies any changes in concentration, making decisions, or remembering things.  Appetite has not changed.  Weight is stable.  Still has anxiety but feels like his meds help prevent it. Take Ativan pretty routinely, if he doesn't he gets overwhelmed and feels shaky.  Denies suicidal or homicidal thoughts.  Patient denies increased energy with decreased need for sleep, increased talkativeness, racing thoughts, impulsivity or risky behaviors, increased spending, increased libido, grandiosity, increased irritability or anger, paranoia, or hallucinations.  Denies dizziness, syncope, seizures, numbness, tingling, tremor, tics, unsteady gait, slurred speech, confusion. Denies muscle or joint pain, stiffness, or dystonia.  Individual Medical History/ Review of Systems: Changes? :No     Past medications for mental health diagnoses include: Ambien, Ativan, Epitol, Lamictal, Prozac, Xanax, Wellbutrin  Allergies: Patient has no known allergies.  Current Medications:  Current Outpatient Medications:    aspirin EC 81 MG tablet, Take 1 tablet (81 mg total) by mouth daily., Disp: , Rfl:    atorvastatin (LIPITOR) 80 MG tablet, TAKE 1 TABLET BY MOUTH DAILY, Disp: 90 tablet, Rfl: 1   empagliflozin (JARDIANCE) 25 MG TABS tablet, Take 1 tablet (25 mg total) by mouth daily before breakfast., Disp: 30 tablet, Rfl: 2   ezetimibe (ZETIA) 10 MG tablet, Take 1 tablet (10 mg total) by mouth  daily., Disp: 90 tablet, Rfl: 1   Finerenone (KERENDIA) 10 MG TABS, Take 1 tablet (10 mg total) by mouth daily., Disp: 90 tablet, Rfl: 1   glucose blood (ONETOUCH ULTRA) test strip, USE 1 STRIP TO CHECK GLUCOSE 3 TIMES DAILY, Disp: 300 strip, Rfl: 3   insulin degludec (TRESIBA FLEXTOUCH) 100 UNIT/ML FlexTouch Pen, Inject 55 Units into the skin daily., Disp: 15 mL, Rfl: 1   Insulin Pen Needle (PEN NEEDLES) 32G X 4 MM MISC, Use as directed, Disp: 100 each, Rfl: 2   Menthol-Methyl Salicylate (ARTHRITIS HOT) 10-15 % CREA, Apply 1 application  topically daily as needed (pain)., Disp: , Rfl:    Menthol-Methyl Salicylate (ICY HOT) 10-30 % STCK, Apply 1 application topically 3 (three) times daily as needed (pain)., Disp: , Rfl:    metFORMIN (GLUCOPHAGE) 1000 MG tablet, TAKE 1 TABLET BY MOUTH TWICE DAILY WITH MEALS, Disp: 180 tablet, Rfl: 1   metoprolol tartrate (LOPRESSOR) 25 MG tablet, TAKE 1 TABLET BY MOUTH 2 TIMES DAILY., Disp: 180 tablet, Rfl: 0   Multiple Vitamin (MULTIVITAMIN WITH MINERALS) TABS tablet, Take 1 tablet by mouth daily., Disp: , Rfl:    omeprazole (PRILOSEC) 40 MG capsule, Take 1 capsule (40 mg total) by mouth daily., Disp: 90 capsule, Rfl: 0   Semaglutide (RYBELSUS) 14 MG TABS, Take 1 tablet (14 mg total) by mouth daily at 6 (six) AM., Disp: 90 tablet, Rfl: 0   valsartan (DIOVAN) 320 MG tablet, Take 1 tablet (320 mg total) by mouth daily., Disp: 90 tablet, Rfl: 0   Ascorbic Acid (VITAMIN C) 500 MG CAPS, Take 500 mg by mouth daily.  (Patient not taking: Reported on 01/21/2023),  Disp: , Rfl:    b complex vitamins tablet, Take 1 tablet by mouth daily.  (Patient not taking: Reported on 01/21/2023), Disp: , Rfl:    cholecalciferol (VITAMIN D3) 25 MCG (1000 UNIT) tablet, Take 2,000 Units by mouth daily. (Patient not taking: Reported on 01/21/2023), Disp: , Rfl:    Coenzyme Q10 (COQ10) 200 MG CAPS, Take 200 mg by mouth daily. (Patient not taking: Reported on 01/21/2023), Disp: , Rfl:    FLUoxetine  (PROZAC) 40 MG capsule, Take 1 capsule (40 mg total) by mouth every morning., Disp: 90 capsule, Rfl: 1   Garlic 500 MG CAPS, Take 500 mg by mouth daily. (Patient not taking: Reported on 01/21/2023), Disp: , Rfl:    lamoTRIgine (LAMICTAL) 150 MG tablet, Take 1 tablet (150 mg total) by mouth 2 (two) times daily., Disp: 180 tablet, Rfl: 1   [START ON 02/12/2023] LORazepam (ATIVAN) 1 MG tablet, Take 1 tablet (1 mg total) by mouth every 8 (eight) hours as needed for anxiety., Disp: 270 tablet, Rfl: 1   Omega-3 Fatty Acids (FISH OIL) 1000 MG CAPS, Take 2,000 mg by mouth daily. (Patient not taking: Reported on 01/21/2023), Disp: , Rfl:    sildenafil (VIAGRA) 50 MG tablet, Take 1 tablet (50 mg total) by mouth daily as needed for erectile dysfunction. (Patient not taking: Reported on 01/21/2023), Disp: 10 tablet, Rfl: 11   tadalafil (CIALIS) 5 MG tablet, Take 1 tablet (5 mg total) by mouth daily as needed for erectile dysfunction. (Patient not taking: Reported on 01/21/2023), Disp: 30 tablet, Rfl: 11   tiZANidine (ZANAFLEX) 4 MG tablet, One by mouth twice a day as needed for neck spasm (Patient not taking: Reported on 01/21/2023), Disp: 30 tablet, Rfl: 2   trospium (SANCTURA) 20 MG tablet, Take 1 tablet (20 mg total) by mouth 2 (two) times daily. (Patient not taking: Reported on 01/21/2023), Disp: 60 tablet, Rfl: 11  Current Facility-Administered Medications:    ciprofloxacin (CIPRO) tablet 500 mg, 500 mg, Oral, Once, Bjorn Pippin, MD Medication Side Effects: none  Family Medical/ Social History: Changes?  No  MENTAL HEALTH EXAM:  There were no vitals taken for this visit.There is no height or weight on file to calculate BMI.  General Appearance: Casual and Well Groomed  Eye Contact:  Good  Speech:  Clear and Coherent, Normal Rate, and Talkative  Volume:  Normal  Mood:  Euthymic  Affect:  Congruent  Thought Process:  Goal Directed and Descriptions of Associations: Circumstantial  Orientation:  Full (Time,  Place, and Person)  Thought Content: Logical   Suicidal Thoughts:  No  Homicidal Thoughts:  No  Memory:  WNL  Judgement:  Good  Insight:  Good  Psychomotor Activity:  Normal  Concentration:  Concentration: Good and Attention Span: Good  Recall:  Good  Fund of Knowledge: Good  Language: Good  Assets:  Desire for Improvement  ADL's:  Intact  Cognition: WNL  Prognosis:  Good   DIAGNOSES:    ICD-10-CM   1. Recurrent major depressive disorder, in partial remission (HCC)  F33.41     2. Generalized anxiety disorder  F41.1      Receiving Psychotherapy: No   RECOMMENDATIONS:  PDMP was reviewed. Ativan last filled 01/14/2023. I provided 20 minutes of face to face time during this encounter, including time spent before and after the visit in records review, medical decision making, counseling pertinent to today's visit, and charting.   He's doing well so no changes needed. We discussed the short-term and  long-term risks of benzodiazepines including sedation, increased risk of falling, dizziness, tolerance, and addictive potential.  Patient understands and accepts.   Continue Prozac 40 mg every morning. Continue Lamictal 150 mg 1 p.o. twice daily. Continue Ativan 1 mg, 1 p.o. 3 times daily as needed. Return in 6 months.  Melony Overly, PA-C

## 2023-01-27 ENCOUNTER — Ambulatory Visit: Payer: Medicare Other | Admitting: Urology

## 2023-01-31 ENCOUNTER — Other Ambulatory Visit: Payer: Self-pay

## 2023-01-31 DIAGNOSIS — M79671 Pain in right foot: Secondary | ICD-10-CM | POA: Diagnosis not present

## 2023-01-31 DIAGNOSIS — E1140 Type 2 diabetes mellitus with diabetic neuropathy, unspecified: Secondary | ICD-10-CM | POA: Diagnosis not present

## 2023-01-31 DIAGNOSIS — M79672 Pain in left foot: Secondary | ICD-10-CM | POA: Diagnosis not present

## 2023-01-31 DIAGNOSIS — I739 Peripheral vascular disease, unspecified: Secondary | ICD-10-CM | POA: Diagnosis not present

## 2023-01-31 MED ORDER — ATORVASTATIN CALCIUM 80 MG PO TABS: 80.0000 mg | ORAL_TABLET | Freq: Every day | ORAL | 2 refills | Status: DC

## 2023-02-07 DIAGNOSIS — H524 Presbyopia: Secondary | ICD-10-CM | POA: Diagnosis not present

## 2023-02-07 DIAGNOSIS — H02822 Cysts of right lower eyelid: Secondary | ICD-10-CM | POA: Diagnosis not present

## 2023-02-07 DIAGNOSIS — H2513 Age-related nuclear cataract, bilateral: Secondary | ICD-10-CM | POA: Diagnosis not present

## 2023-02-07 DIAGNOSIS — E119 Type 2 diabetes mellitus without complications: Secondary | ICD-10-CM | POA: Diagnosis not present

## 2023-02-07 DIAGNOSIS — Z7984 Long term (current) use of oral hypoglycemic drugs: Secondary | ICD-10-CM | POA: Diagnosis not present

## 2023-02-07 LAB — HM DIABETES EYE EXAM

## 2023-02-10 ENCOUNTER — Telehealth: Payer: Self-pay

## 2023-02-10 DIAGNOSIS — K080 Exfoliation of teeth due to systemic causes: Secondary | ICD-10-CM | POA: Diagnosis not present

## 2023-02-11 ENCOUNTER — Ambulatory Visit: Payer: Medicare Other | Admitting: Internal Medicine

## 2023-02-11 NOTE — Telephone Encounter (Signed)
Patient called to make aware that Robert Walters was approved.

## 2023-02-14 ENCOUNTER — Ambulatory Visit (INDEPENDENT_AMBULATORY_CARE_PROVIDER_SITE_OTHER): Payer: Medicare Other | Admitting: Gastroenterology

## 2023-02-18 ENCOUNTER — Ambulatory Visit: Payer: Medicare Other | Attending: Internal Medicine | Admitting: Internal Medicine

## 2023-02-18 ENCOUNTER — Encounter: Payer: Self-pay | Admitting: Internal Medicine

## 2023-02-18 VITALS — BP 106/74 | HR 103 | Ht 69.0 in | Wt 224.0 lb

## 2023-02-18 DIAGNOSIS — I5032 Chronic diastolic (congestive) heart failure: Secondary | ICD-10-CM | POA: Diagnosis not present

## 2023-02-18 DIAGNOSIS — E781 Pure hyperglyceridemia: Secondary | ICD-10-CM

## 2023-02-18 MED ORDER — OMEGA-3-ACID ETHYL ESTERS 1 G PO CAPS: 2.0000 g | ORAL_CAPSULE | Freq: Two times a day (BID) | ORAL | 3 refills | Status: AC

## 2023-02-18 NOTE — Patient Instructions (Signed)
Medication Instructions:  Your physician has recommended you make the following change in your medication:   Stop Taking Fish oil  Start Taking Lovaza 2 G Two Times Daily   *If you need a refill on your cardiac medications before your next appointment, please call your pharmacy*   Lab Work: NONE   If you have labs (blood work) drawn today and your tests are completely normal, you will receive your results only by: MyChart Message (if you have MyChart) OR A paper copy in the mail If you have any lab test that is abnormal or we need to change your treatment, we will call you to review the results.   Testing/Procedures: NONE     Follow-Up: At [facility], you and your health needs are our priority.  As part of our continuing mission to provide you with exceptional heart care, we have created designated Provider Care Teams.  These Care Teams include your primary Cardiologist (physician) and Advanced Practice Providers (APPs -  Physician Assistants and Nurse Practitioners) who all work together to provide you with the care you need, when you need it.  We recommend signing up for the patient portal called "MyChart".  Sign up information is provided on this After Visit Summary.  MyChart is used to connect with patients for Virtual Visits (Telemedicine).  Patients are able to view lab/test results, encounter notes, upcoming appointments, etc.  Non-urgent messages can be sent to your provider as well.   To learn more about what you can do with MyChart, go to [email or web address].    Your next appointment:   1 year(s)  Provider:   Luane School, MD    Other Instructions Thank you for choosing [facility]!

## 2023-02-18 NOTE — Progress Notes (Signed)
Cardiology Office Note  Date: 02/18/2023   ID: Robert Walters, DOB 14-Jul-1955, MRN [id number]  PCP:  Arnette Felts, FNP  Cardiologist:  Marjo Bicker, MD Electrophysiologist:  None   History of Present Illness: Robert Walters is a 67 y.o. male known to have hypertriglyceridemia, chronic diastolic heart failure, DM 2 is here for follow-up visit.  Overall doing great, no symptoms.  No angina, DOE, orthopnea, PND, leg swelling, dizziness, presyncope and syncope.  No palpitations.  Compliant with medications and has no side effects.  Past Medical History:  Diagnosis Date   Acid reflux disease    Bipolar 1 disorder, mixed (HCC)    BPH (benign prostatic hypertrophy)    CVA (cerebral vascular accident) (HCC) 07/2015   Gastritis    H/O hiatal hernia    Hyperlipidemia    Hypertension    Hypertensive heart disease with chronic diastolic congestive heart failure (HCC) 01/19/2023   Normal nuclear stress test Dec 2011   Obesity    TIA (transient ischemic attack) 05/15/2012   "they think I've had one this am @ 0130" (05/15/2012)   Type II diabetes mellitus (HCC)    Vertigo     Past Surgical History:  Procedure Laterality Date   BILIARY STENT PLACEMENT N/A 09/17/2016   Procedure: BILIARY STENT PLACEMENT;  Surgeon: Malissa Hippo, MD;  [facility];  Service: Endoscopy;  Laterality: N/A;   CARDIOVASCULAR STRESS TEST  2009   EF 63%   CHOLECYSTECTOMY N/A 08/25/2016   Procedure: LAPAROSCOPIC CHOLECYSTECTOMY;  Surgeon: Franky Macho, MD;  Location: [facility];  Service: General;  Laterality: N/A;   COLONOSCOPY     COLONOSCOPY WITH PROPOFOL N/A 02/03/2021   Procedure: COLONOSCOPY WITH PROPOFOL;  Surgeon: Dolores Frame, MD;  [facility];  Service: Gastroenterology;  Laterality: N/A;    incomplete colonoscopy poor prep   COLONOSCOPY WITH PROPOFOL N/A 02/04/2021   Procedure: COLONOSCOPY WITH PROPOFOL;  Surgeon: Dolores Frame, MD;  [facility];  Service: Gastroenterology;  Laterality: N/A;  1:45   ERCP N/A 09/17/2016   Procedure: ENDOSCOPIC RETROGRADE CHOLANGIOPANCREATOGRAPHY (ERCP);  Surgeon: Malissa Hippo, MD;  [facility];  Service: Endoscopy;  Laterality: N/A;  730   ERCP N/A 12/03/2016   Procedure: ENDOSCOPIC RETROGRADE CHOLANGIOPANCREATOGRAPHY (ERCP);  Surgeon: Malissa Hippo, MD;  [facility];  Service: Gastroenterology;  Laterality: N/A;   ESOPHAGEAL DILATION  02/03/2021   Procedure: ESOPHAGEAL DILATION;  Surgeon: Marguerita Merles, Reuel Boom, MD;  [facility];  Service: Gastroenterology;;  savory 16 and 18 fr   ESOPHAGOGASTRODUODENOSCOPY     ESOPHAGOGASTRODUODENOSCOPY (EGD) WITH PROPOFOL N/A 02/03/2021   Procedure: ESOPHAGOGASTRODUODENOSCOPY (EGD) WITH PROPOFOL;  Surgeon: Dolores Frame, MD;  [facility];  Service: Gastroenterology;  Laterality: N/A;   GASTROINTESTINAL STENT REMOVAL N/A 12/03/2016   Procedure: BILIARY STENT REMOVAL;  Surgeon: Malissa Hippo, MD;  [facility];  Service: Gastroenterology;  Laterality: N/A;   NO PAST SURGERIES     POLYPECTOMY  02/04/2021   Procedure: POLYPECTOMY INTESTINAL;  Surgeon: Dolores Frame, MD;  [facility];  Service: Gastroenterology;;   SPHINCTEROTOMY N/A 09/17/2016   Procedure: Dennison Mascot;  Surgeon: Malissa Hippo, MD;  [facility];  Service: Endoscopy;  Laterality: N/A;    Current Outpatient Medications  Medication Sig Dispense Refill   aspirin EC 81 MG tablet Take 1 tablet (81 mg total) by mouth daily.     atorvastatin (  LIPITOR) 80 MG tablet Take 1 tablet (80 mg total) by mouth daily. 90 tablet 2   Coenzyme Q10 (COQ10) 200 MG CAPS Take 200 mg by mouth daily.     empagliflozin (JARDIANCE) 25 MG TABS tablet Take 1 tablet (25 mg total) by mouth daily before breakfast. 30 tablet 2   ezetimibe (ZETIA) 10 MG tablet Take 1 tablet (10 mg total) by mouth daily. 90 tablet 1    Finerenone (KERENDIA) 10 MG TABS Take 1 tablet (10 mg total) by mouth daily. 90 tablet 1   FLUoxetine (PROZAC) 40 MG capsule Take 1 capsule (40 mg total) by mouth every morning. 90 capsule 1   Garlic 500 MG CAPS Take 500 mg by mouth daily.     glucose blood (ONETOUCH ULTRA) test strip USE 1 STRIP TO CHECK GLUCOSE 3 TIMES DAILY 300 strip 3   insulin degludec (TRESIBA FLEXTOUCH) 100 UNIT/ML FlexTouch Pen Inject 55 Units into the skin daily. 15 mL 1   Insulin Pen Needle (PEN NEEDLES) 32G X 4 MM MISC Use as directed 100 each 2   lamoTRIgine (LAMICTAL) 150 MG tablet Take 1 tablet (150 mg total) by mouth 2 (two) times daily. 180 tablet 1   LORazepam (ATIVAN) 1 MG tablet Take 1 tablet (1 mg total) by mouth every 8 (eight) hours as needed for anxiety. 270 tablet 1   Menthol-Methyl Salicylate (ARTHRITIS HOT) 10-15 % CREA Apply 1 application  topically daily as needed (pain).     Menthol-Methyl Salicylate (ICY HOT) 10-30 % STCK Apply 1 application topically 3 (three) times daily as needed (pain).     metFORMIN (GLUCOPHAGE) 1000 MG tablet TAKE 1 TABLET BY MOUTH TWICE DAILY WITH MEALS 180 tablet 1   metoprolol tartrate (LOPRESSOR) 25 MG tablet TAKE 1 TABLET BY MOUTH 2 TIMES DAILY. 180 tablet 0   Multiple Vitamin (MULTIVITAMIN WITH MINERALS) TABS tablet Take 1 tablet by mouth daily.     omega-3 acid ethyl esters (LOVAZA) 1 g capsule Take 2 capsules (2 g total) by mouth 2 (two) times daily. 360 capsule 3   omeprazole (PRILOSEC) 40 MG capsule Take 1 capsule (40 mg total) by mouth daily. 90 capsule 0   Semaglutide (RYBELSUS) 14 MG TABS Take 1 tablet (14 mg total) by mouth daily at 6 (six) AM. 90 tablet 0   tiZANidine (ZANAFLEX) 4 MG tablet One by mouth twice a day as needed for neck spasm 30 tablet 2   trospium (SANCTURA) 20 MG tablet Take 1 tablet (20 mg total) by mouth 2 (two) times daily. 60 tablet 11   valsartan (DIOVAN) 320 MG tablet Take 1 tablet (320 mg total) by mouth daily. 90 tablet 0   Current  Facility-Administered Medications  Medication Dose Route Frequency Provider Last Rate Last Admin   ciprofloxacin (CIPRO) tablet 500 mg  500 mg Oral Once Bjorn Pippin, MD       Allergies:  Patient has no known allergies.   Social History: The patient  reports that he has never smoked. He has never used smokeless tobacco. He reports that he does not drink alcohol and does not use drugs.   Family History: The patient's family history includes Colon cancer in his father; Hyperlipidemia in his father; Stroke (age of onset: 26) in his mother.   ROS:  Please see the history of present illness. Otherwise, complete review of systems is positive for none.  All other systems are reviewed and negative.   Physical Exam: VS:  BP 106/74   Pulse [name])  103   Ht 5\' 9"  (1.753 m)   Wt 224 lb (101.6 kg)   SpO2 95%   BMI 33.08 kg/m , BMI Body mass index is 33.08 kg/m.  Wt Readings from Last 3 Encounters:  02/18/23 224 lb (101.6 kg)  01/11/23 223 lb (101.2 kg)  09/09/22 223 lb (101.2 kg)    General: Patient appears comfortable at rest. HEENT: Conjunctiva and lids normal, oropharynx clear with moist mucosa. Neck: Supple, no elevated JVP or carotid bruits, no thyromegaly. Lungs: Clear to auscultation, nonlabored breathing at rest. Cardiac: Regular rate and rhythm, no S3 or significant systolic murmur, no pericardial rub. Abdomen: Soft, nontender, no hepatomegaly, bowel sounds present, no guarding or rebound. Extremities: No pitting edema, distal pulses 2+. Skin: Warm and dry. Musculoskeletal: No kyphosis. Neuropsychiatric: Alert and oriented x3, affect grossly appropriate.  Recent Labwork: 01/11/2023: ALT 58; AST 43; BUN 22; Creatinine, Ser 1.30; Potassium 4.8; Sodium 138     Component Value Date/Time   CHOL 123 01/11/2023 1220   TRIG 239 (H) 01/11/2023 1220   HDL 38 (L) 01/11/2023 1220   CHOLHDL 3.2 01/11/2023 1220   CHOLHDL 3.4 07/17/2021 1047   VLDL 39 07/17/2021 1047   LDLCALC 47 01/11/2023  1220   LDLDIRECT 124.3 11/10/2010 0953     Assessment and Plan:   Hypertriglyceridemia (More than 500s at 1 point) Chronic diastolic heart failure HTN, controlled Type 2 diabetes mellitus History of CVA   -Lipid panel from 01/2023 reviewed, mildly elevated triglycerides 239 and controlled LDL, 47.  Currently taking atorvastatin 80 mg nightly, Zetia 10 mg once daily and fish oil 2 g once daily.  Will switch fish oil supplements to Lovaza 2 g twice daily.  He is compensated from heart failure standpoint, no DOE, leg swelling, orthopnea or PND.  Not on diuretic.  His resting HR is mildly elevated but denies having any palpitations.  On metoprolol.  For history of CVA, on aspirin and high intensity statin.  LDL at goal.  Continue current antihypertensives, valsartan 320 mg once daily, metoprolol tartrate 25 mg twice daily.   I spent a total duration of 30 minutes reviewing prior notes, labs, face-to-face discussion with medical condition, pathophysiology, evaluation, management, answering all his questions, ordering medications, documenting the findings in the note.   Medication Adjustments/Labs and Tests Ordered: Current medicines are reviewed at length with the patient today.  Concerns regarding medicines are outlined above.    Disposition:  Follow up 1 year  Signed, Jazier Mcglamery Verne Spurr, MD, 02/18/2023 4:51 PM    [facility] at [facility] [address] [place], [place] [address]

## 2023-02-24 ENCOUNTER — Ambulatory Visit (INDEPENDENT_AMBULATORY_CARE_PROVIDER_SITE_OTHER): Payer: Medicare Other | Admitting: Gastroenterology

## 2023-03-03 ENCOUNTER — Ambulatory Visit: Payer: Medicare Other | Admitting: Urology

## 2023-03-14 ENCOUNTER — Telehealth (INDEPENDENT_AMBULATORY_CARE_PROVIDER_SITE_OTHER): Payer: Self-pay

## 2023-03-14 NOTE — Telephone Encounter (Signed)
Error

## 2023-03-16 ENCOUNTER — Other Ambulatory Visit: Payer: Self-pay

## 2023-03-16 MED ORDER — METOPROLOL TARTRATE 25 MG PO TABS: ORAL_TABLET | ORAL | 3 refills | Status: AC

## 2023-03-16 MED ORDER — VALSARTAN 320 MG PO TABS: 320.0000 mg | ORAL_TABLET | Freq: Every day | ORAL | 3 refills | Status: DC

## 2023-03-16 NOTE — Telephone Encounter (Signed)
This is a [place] pt.  °

## 2023-03-21 ENCOUNTER — Other Ambulatory Visit: Payer: Self-pay | Admitting: Pharmacist

## 2023-03-21 NOTE — Progress Notes (Signed)
Pharmacy Medication Assistance Program Note    03/21/2023  Patient ID: CYRON HOAG, male   DOB: 01-08-1956, 67 y.o.   MRN: [id number]     03/21/2023  Outreach Medication One  Initial Outreach Date (Medication One) 03/21/2023  Manufacturer Medication One [employer] Drugs Evaristo Bury  Dose of Tresiba U100  Type of Radiographer, therapeutic Assistance  Date Application Sent to Patient 03/21/2023  Application Items Requested Application  Date Application Sent to Prescriber 03/21/2023  Name of Prescriber Arnette Felts  Date Application Received From Provider 03/21/2023           03/21/2023  Outreach Medication Two  Initial Outreach Date (Medication Two) 03/21/2023  Manufacturer Medication Two Novo Nordisk  [employer]ord[employer] Drugs Rybelsus  Dose of Ryblesus 14 mg  Type of Radiographer, therapeutic Assistance  Date Application Sent to Patient 03/21/2023  Application Items Requested Application  Date Application Sent to Prescriber 03/21/2023  Name of Prescriber Arnette Felts  Date Application Received From Provider 03/21/2023          03/21/2023  Outreach Medication Three  Initial Outreach Date (Medication Three) 03/21/2023  Manufacturer Medication Three [employer]  [employer] Drugs Jardiance  Dose of Jardiance 25  Type of Assistance Manufacturer Assistance  Date Application Sent to Patient 03/21/2023  Application Items Requested Application  Date Application Sent to Prescriber 03/21/2023  Name of Prescriber Arnette Felts  Anticpated Follow Up Date with Patient/Provider 03/30/2023  Date Application Received From Provider 03/21/2023     Catie Eppie Gibson, PharmD, BCACP, CPP Clinical Pharmacist [facility] [phone]

## 2023-03-24 ENCOUNTER — Ambulatory Visit (INDEPENDENT_AMBULATORY_CARE_PROVIDER_SITE_OTHER): Payer: Medicare Other | Admitting: Gastroenterology

## 2023-04-03 DIAGNOSIS — E119 Type 2 diabetes mellitus without complications: Secondary | ICD-10-CM | POA: Diagnosis not present

## 2023-04-05 DIAGNOSIS — K080 Exfoliation of teeth due to systemic causes: Secondary | ICD-10-CM | POA: Diagnosis not present

## 2023-04-07 ENCOUNTER — Telehealth: Payer: Self-pay | Admitting: Pharmacist

## 2023-04-07 NOTE — Telephone Encounter (Signed)
Called patient to inquire if he had received mailed applications for [employer] (Tresiba and Rybelsus) and [address] [place]) 2025 applications. Left voicemail.   Catie Eppie Gibson, PharmD, BCACP, CPP Clinical Pharmacist [facility] [phone]

## 2023-04-07 NOTE — Progress Notes (Signed)
Patient called back. He did mail back applications, but it has been a few days. He plans to come by tomorrow to pick up a shipment of pen needles and Rybelsus 14 mg, so will collaborate with office staff to prepare patient portion of applications for him to sign.   Catie Eppie Gibson, PharmD, BCACP, CPP Clinical Pharmacist [facility] [phone]

## 2023-04-11 DIAGNOSIS — I739 Peripheral vascular disease, unspecified: Secondary | ICD-10-CM | POA: Diagnosis not present

## 2023-04-11 DIAGNOSIS — M79672 Pain in left foot: Secondary | ICD-10-CM | POA: Diagnosis not present

## 2023-04-11 DIAGNOSIS — E1140 Type 2 diabetes mellitus with diabetic neuropathy, unspecified: Secondary | ICD-10-CM | POA: Diagnosis not present

## 2023-04-11 DIAGNOSIS — M79671 Pain in right foot: Secondary | ICD-10-CM | POA: Diagnosis not present

## 2023-04-14 NOTE — Progress Notes (Signed)
   04/14/2023  Patient ID: Robert Walters, male   DOB: [birth year], 67 y.o.   MRN: [id number]  Returning patient call, and he states he has still not received his Evaristo Bury he was expecting from [employer] [facility].  He does still have about a month supply on hand but is concerned about running out of medication.  Contacted [employer], and they state the patient has actually received the maximum amount of refills for his prescriptions that he receives through [employer].  The representative also states he has not received [place]-B[place] through their program, but the patient called earlier this week and was told the refill for Evaristo Bury was being shipped.  Passing along to Catie to follow-up next week when she returns to the office.  Lenna Gilford, PharmD, DPLA

## 2023-04-15 ENCOUNTER — Encounter: Payer: Self-pay | Admitting: Pharmacist

## 2023-04-15 NOTE — Progress Notes (Signed)
Pharmacy Quality Measure Review  This patient is appearing on a report for being at risk of failing the adherence measure for diabetes medications this calendar year.   Medication: metformin 1000 mg daily Last fill date: 01/14/23 for 90 day supply  Contacted pharmacy to facilitate refills.  Catie Eppie Gibson, PharmD, BCACP, CPP Clinical Pharmacist [facility] [phone]

## 2023-04-18 NOTE — Progress Notes (Signed)
Pharmacy Medication Assistance Program Note    04/18/2023  Patient ID: Robert Walters, male   DOB: 1955/12/10, 67 y.o.   MRN: [id number]     03/21/2023 04/18/2023  Outreach Medication One  Initial Outreach Date (Medication One) 03/21/2023   Manufacturer Medication One [employer] Drugs Evaristo Bury   Dose of Evaristo Bury U100   Type of Forensic scientist Assistance  Date Application Sent to Patient 03/21/2023   Application Items Requested Application   Date Application Sent to Prescriber 03/21/2023   Name of Prescriber Arnette Felts   Date Application Received From Provider 03/21/2023 03/21/2023  Date Application Submitted to Manufacturer  04/18/2023  Method Application Sent to Manufacturer  Fax        03/21/2023 04/18/2023  Outreach Medication Two  Initial Outreach Date (Medication Two) 03/21/2023   Manufacturer Medication Two Novo Nordisk   Nordisk Drugs Rybelsus   Dose of Ryblesus 14 mg   Type of Forensic scientist Assistance  Date Application Sent to Patient 03/21/2023   Application Items Requested Application   Date Application Sent to Prescriber 03/21/2023   Name of Prescriber Arnette Felts   Date Application Received From Provider 03/21/2023 03/21/2023  Method Application Sent to Manufacturer  Fax  Date Application Submitted to Manufacturer  04/18/2023        03/21/2023 04/18/2023  Outreach Medication Three  Initial Outreach Date (Medication Three) 03/21/2023   Manufacturer Medication Three [employer]   [employer] Drugs Jardiance   Dose of Jardiance 25   Type of Forensic scientist Assistance  Date Application Sent to Patient 03/21/2023   Application Items Requested Application   Date Application Sent to Prescriber 03/21/2023   Name of Prescriber Arnette Felts   Anticpated Follow Up Date with Patient/Provider 03/30/2023   Date Application Received From  Provider 03/21/2023 03/21/2023  Method Application Sent to Manufacturer  Fax   Applications for 2025 submitted.   Catie Eppie Gibson, PharmD, BCACP, CPP Clinical Pharmacist [facility] [phone]

## 2023-04-29 ENCOUNTER — Other Ambulatory Visit: Payer: Self-pay

## 2023-05-02 ENCOUNTER — Other Ambulatory Visit: Payer: Self-pay | Admitting: Pharmacist

## 2023-05-02 DIAGNOSIS — E1122 Type 2 diabetes mellitus with diabetic chronic kidney disease: Secondary | ICD-10-CM

## 2023-05-02 DIAGNOSIS — E1169 Type 2 diabetes mellitus with other specified complication: Secondary | ICD-10-CM

## 2023-05-02 MED ORDER — TRESIBA FLEXTOUCH 100 UNIT/ML ~~LOC~~ SOPN: 55.0000 [IU] | PEN_INJECTOR | Freq: Every day | SUBCUTANEOUS | 1 refills | Status: DC

## 2023-05-02 NOTE — Progress Notes (Signed)
Care Coordination Call  Patient contacted the office to follow up on the status of his Guinea-Bissau shipment from [employer]. [employer] - [phone] tracking number. Appears there is not tracking information yet - may ship this week.   Will send Tresiba refill to local pharmacy.   Called patient and left voicemail.   Catie Eppie Gibson, PharmD, BCACP, CPP Clinical Pharmacist [facility] [phone]

## 2023-05-09 ENCOUNTER — Ambulatory Visit: Payer: Medicare Other | Admitting: Nurse Practitioner

## 2023-05-09 NOTE — Progress Notes (Deleted)
 LILLETTE Kristeen JINNY Gladis, CMA,acting as a neurosurgeon for [name], FNP.,have documented all relevant documentation on the behalf of [name], FNP,as directed by  [name], FNP while in the presence of [name], FNP.  Subjective:  Patient ID: Robert Walters , male    DOB: 1955-07-02 , 68 y.o.   MRN: [id number]  No chief complaint on file.   HPI  Patient presents today for a bp and dm follow up, Patient reports compliance with medication. Patient denies any chest pain, SOB, or headaches. Patient has no concerns today.     Past Medical History:  Diagnosis Date  . Acid reflux disease   . Bipolar 1 disorder, mixed (HCC)   . BPH (benign prostatic hypertrophy)   . CVA (cerebral vascular accident) (HCC) 07/2015  . Gastritis   . H/O hiatal hernia   . Hyperlipidemia   . Hypertension   . Hypertensive heart disease with chronic diastolic congestive heart failure (HCC) 01/19/2023  . Normal nuclear stress test Dec 2011  . Obesity   . TIA (transient ischemic attack) 05/15/2012   they think I've had one this am @ 0130 (05/15/2012)  . Type II diabetes mellitus (HCC)   . Vertigo      Family History  Problem Relation Age of Onset  . Hyperlipidemia Father        in a nursing home  . Colon cancer Father   . Stroke Mother 33     Current Outpatient Medications:  .  aspirin  EC 81 MG tablet, Take 1 tablet (81 mg total) by mouth daily., Disp: , Rfl:  .  atorvastatin  (LIPITOR ) 80 MG tablet, Take 1 tablet (80 mg total) by mouth daily., Disp: 90 tablet, Rfl: 2 .  Coenzyme Q10 (COQ10) 200 MG CAPS, Take 200 mg by mouth daily., Disp: , Rfl:  .  empagliflozin  (JARDIANCE ) 25 MG TABS tablet, Take 1 tablet (25 mg total) by mouth daily before breakfast., Disp: 30 tablet, Rfl: 2 .  ezetimibe  (ZETIA ) 10 MG tablet, Take 1 tablet (10 mg total) by mouth daily., Disp: 90 tablet, Rfl: 1 .  Finerenone  (KERENDIA ) 10 MG TABS, Take 1 tablet (10 mg total) by mouth daily. (Patient not taking: Reported on 03/21/2023),  Disp: 90 tablet, Rfl: 1 .  FLUoxetine  (PROZAC ) 40 MG capsule, Take 1 capsule (40 mg total) by mouth every morning., Disp: 90 capsule, Rfl: 1 .  glucose blood (ONETOUCH ULTRA) test strip, USE 1 STRIP TO CHECK GLUCOSE 3 TIMES DAILY, Disp: 300 strip, Rfl: 3 .  insulin  degludec (TRESIBA  FLEXTOUCH) 100 UNIT/ML FlexTouch Pen, Inject 55 Units into the skin daily., Disp: 15 mL, Rfl: 1 .  Insulin  Pen Needle (PEN NEEDLES) 32G X 4 MM MISC, Use as directed, Disp: 100 each, Rfl: 2 .  lamoTRIgine  (LAMICTAL ) 150 MG tablet, Take 1 tablet (150 mg total) by mouth 2 (two) times daily., Disp: 180 tablet, Rfl: 1 .  LORazepam  (ATIVAN ) 1 MG tablet, Take 1 tablet (1 mg total) by mouth every 8 (eight) hours as needed for anxiety., Disp: 270 tablet, Rfl: 1 .  Menthol-Methyl Salicylate (ARTHRITIS HOT) 10-15 % CREA, Apply 1 application  topically daily as needed (pain)., Disp: , Rfl:  .  Menthol-Methyl Salicylate (ICY HOT) 10-30 % STCK, Apply 1 application topically 3 (three) times daily as needed (pain)., Disp: , Rfl:  .  metFORMIN  (GLUCOPHAGE ) 1000 MG tablet, TAKE 1 TABLET BY MOUTH TWICE DAILY WITH MEALS, Disp: 180 tablet, Rfl: 1 .  metoprolol  tartrate (LOPRESSOR ) 25 MG tablet,  TAKE 1 TABLET BY MOUTH 2 TIMES DAILY., Disp: 180 tablet, Rfl: 3 .  Multiple Vitamin (MULTIVITAMIN WITH MINERALS) TABS tablet, Take 1 tablet by mouth daily., Disp: , Rfl:  .  omega-3 acid ethyl esters (LOVAZA ) 1 g capsule, Take 2 capsules (2 g total) by mouth 2 (two) times daily., Disp: 360 capsule, Rfl: 3 .  omeprazole  (PRILOSEC) 40 MG capsule, Take 1 capsule (40 mg total) by mouth daily., Disp: 90 capsule, Rfl: 0 .  Semaglutide  (RYBELSUS ) 14 MG TABS, Take 1 tablet (14 mg total) by mouth daily at 6 (six) AM., Disp: 90 tablet, Rfl: 0 .  tiZANidine  (ZANAFLEX ) 4 MG tablet, One by mouth twice a day as needed for neck spasm, Disp: 30 tablet, Rfl: 2 .  trospium  (SANCTURA ) 20 MG tablet, Take 1 tablet (20 mg total) by mouth 2 (two) times daily., Disp: 60  tablet, Rfl: 11 .  valsartan  (DIOVAN ) 320 MG tablet, Take 1 tablet (320 mg total) by mouth daily., Disp: 90 tablet, Rfl: 3  Current Facility-Administered Medications:  .  ciprofloxacin  (CIPRO ) tablet 500 mg, 500 mg, Oral, Once, Wrenn, John, MD   No Known Allergies   Review of Systems  Constitutional: Negative.   HENT: Negative.    Eyes: Negative.   Respiratory: Negative.    Cardiovascular: Negative.   Gastrointestinal: Negative.     There were no vitals filed for this visit. There is no height or weight on file to calculate BMI.  Wt Readings from Last 3 Encounters:  02/18/23 224 lb (101.6 kg)  01/11/23 223 lb (101.2 kg)  09/09/22 223 lb (101.2 kg)    The ASCVD Risk score (Arnett DK, et al., 2019) failed to calculate for the following reasons:   Risk score cannot be calculated because patient has a medical history suggesting prior/existing ASCVD  Objective:  Physical Exam      Assessment And Plan:  Type 2 diabetes mellitus with hyperlipidemia (HCC)  Mixed hyperlipidemia  Essential hypertension    No follow-ups on file.  Patient was given opportunity to ask questions. Patient verbalized understanding of the plan and was able to repeat key elements of the plan. All questions were answered to their satisfaction.    LILLETTE [name], FNP, have reviewed all documentation for this visit. The documentation on 05/09/23 for the exam, diagnosis, procedures, and orders are all accurate and complete.   IF YOU HAVE BEEN REFERRED TO A SPECIALIST, IT MAY TAKE 1-2 WEEKS TO SCHEDULE/PROCESS THE REFERRAL. IF YOU HAVE NOT HEARD FROM US /SPECIALIST IN TWO WEEKS, PLEASE GIVE US  A CALL AT [phone] X [phone].

## 2023-05-13 ENCOUNTER — Telehealth: Payer: Self-pay

## 2023-05-13 NOTE — Telephone Encounter (Signed)
 Catie submitted application for Jardiance  through [facility].  I called to follow up today and they did not receive the electronic prescription.  I have faxed the provider portion of the application to [name] to review and sign and will resubmit when returned to me.

## 2023-05-13 NOTE — Telephone Encounter (Signed)
 PAP: Patient assistance application Insulin  Degludec Flextouch and Rybelsus  for has been approved by PAP Companies: [employer] from 05/04/2023 to 05/02/2024. Medication should be delivered to PAP Delivery: Provider's office For further shipping updates, please contact [employer] at [phone] Pt ID is: [id number]

## 2023-05-23 ENCOUNTER — Other Ambulatory Visit: Payer: Self-pay | Admitting: Nurse Practitioner

## 2023-05-23 DIAGNOSIS — N1831 Chronic kidney disease, stage 3a: Secondary | ICD-10-CM

## 2023-05-23 NOTE — Telephone Encounter (Signed)
Received provider portion of [employer] PAP application,  Faxed application to [facility].

## 2023-05-24 ENCOUNTER — Ambulatory Visit: Payer: Medicare Other | Admitting: Nurse Practitioner

## 2023-05-24 NOTE — Progress Notes (Deleted)
Madelaine Bhat, CMA,acting as a Neurosurgeon for Arnette Felts, FNP.,have documented all relevant documentation on the behalf of Arnette Felts, FNP,as directed by  Arnette Felts, FNP while in the presence of Arnette Felts, FNP.  Subjective:  Patient ID: Robert Walters , male    DOB: 1956/03/27 , 68 y.o.   MRN: [id number]  No chief complaint on file.   HPI  Patient presents today for a bp and dm follow up, Patient reports compliance with medication. Patient denies any chest pain, SOB, or headaches. Patient has no concerns today.        Past Medical History:  Diagnosis Date  . Acid reflux disease   . Bipolar 1 disorder, mixed (HCC)   . BPH (benign prostatic hypertrophy)   . CVA (cerebral vascular accident) (HCC) 07/2015  . Gastritis   . H/O hiatal hernia   . Hyperlipidemia   . Hypertension   . Hypertensive heart disease with chronic diastolic congestive heart failure (HCC) 01/19/2023  . Normal nuclear stress test Dec 2011  . Obesity   . TIA (transient ischemic attack) 05/15/2012   "they think I've had one this am @ 0130" (05/15/2012)  . Type II diabetes mellitus (HCC)   . Vertigo      Family History  Problem Relation Age of Onset  . Hyperlipidemia Father        in a nursing home  . Colon cancer Father   . Stroke Mother 37     Current Outpatient Medications:  .  aspirin EC 81 MG tablet, Take 1 tablet (81 mg total) by mouth daily., Disp: , Rfl:  .  atorvastatin (LIPITOR) 80 MG tablet, Take 1 tablet (80 mg total) by mouth daily., Disp: 90 tablet, Rfl: 2 .  Coenzyme Q10 (COQ10) 200 MG CAPS, Take 200 mg by mouth daily., Disp: , Rfl:  .  empagliflozin (JARDIANCE) 25 MG TABS tablet, Take 1 tablet (25 mg total) by mouth daily before breakfast., Disp: 30 tablet, Rfl: 2 .  ezetimibe (ZETIA) 10 MG tablet, Take 1 tablet (10 mg total) by mouth daily., Disp: 90 tablet, Rfl: 1 .  Finerenone (KERENDIA) 10 MG TABS, Take 1 tablet (10 mg total) by mouth daily. (Patient not taking: Reported on  03/21/2023), Disp: 90 tablet, Rfl: 1 .  FLUoxetine (PROZAC) 40 MG capsule, Take 1 capsule (40 mg total) by mouth every morning., Disp: 90 capsule, Rfl: 1 .  glucose blood (ONETOUCH ULTRA) test strip, USE 1 STRIP TO CHECK GLUCOSE 3 TIMES DAILY, Disp: 300 strip, Rfl: 3 .  insulin degludec (TRESIBA FLEXTOUCH) 100 UNIT/ML FlexTouch Pen, Inject 55 Units into the skin daily., Disp: 15 mL, Rfl: 1 .  Insulin Pen Needle (PEN NEEDLES) 32G X 4 MM MISC, Use as directed, Disp: 100 each, Rfl: 2 .  lamoTRIgine (LAMICTAL) 150 MG tablet, Take 1 tablet (150 mg total) by mouth 2 (two) times daily., Disp: 180 tablet, Rfl: 1 .  LORazepam (ATIVAN) 1 MG tablet, Take 1 tablet (1 mg total) by mouth every 8 (eight) hours as needed for anxiety., Disp: 270 tablet, Rfl: 1 .  Menthol-Methyl Salicylate (ARTHRITIS HOT) 10-15 % CREA, Apply 1 application  topically daily as needed (pain)., Disp: , Rfl:  .  Menthol-Methyl Salicylate (ICY HOT) 10-30 % STCK, Apply 1 application topically 3 (three) times daily as needed (pain)., Disp: , Rfl:  .  metFORMIN (GLUCOPHAGE) 1000 MG tablet, TAKE 1 TABLET BY MOUTH TWICE DAILY WITH MEALS, Disp: 180 tablet, Rfl: 1 .  metoprolol tartrate (LOPRESSOR)  25 MG tablet, TAKE 1 TABLET BY MOUTH 2 TIMES DAILY., Disp: 180 tablet, Rfl: 3 .  Multiple Vitamin (MULTIVITAMIN WITH MINERALS) TABS tablet, Take 1 tablet by mouth daily., Disp: , Rfl:  .  omega-3 acid ethyl esters (LOVAZA) 1 g capsule, Take 2 capsules (2 g total) by mouth 2 (two) times daily., Disp: 360 capsule, Rfl: 3 .  omeprazole (PRILOSEC) 40 MG capsule, Take 1 capsule (40 mg total) by mouth daily., Disp: 90 capsule, Rfl: 0 .  Semaglutide (RYBELSUS) 14 MG TABS, Take 1 tablet (14 mg total) by mouth daily at 6 (six) AM., Disp: 90 tablet, Rfl: 0 .  tiZANidine (ZANAFLEX) 4 MG tablet, One by mouth twice a day as needed for neck spasm, Disp: 30 tablet, Rfl: 2 .  trospium (SANCTURA) 20 MG tablet, Take 1 tablet (20 mg total) by mouth 2 (two) times daily.,  Disp: 60 tablet, Rfl: 11 .  valsartan (DIOVAN) 320 MG tablet, Take 1 tablet (320 mg total) by mouth daily., Disp: 90 tablet, Rfl: 3  Current Facility-Administered Medications:  .  ciprofloxacin (CIPRO) tablet 500 mg, 500 mg, Oral, Once, Bjorn Pippin, MD   No Known Allergies   Review of Systems   There were no vitals filed for this visit. There is no height or weight on file to calculate BMI.  Wt Readings from Last 3 Encounters:  02/18/23 224 lb (101.6 kg)  01/11/23 223 lb (101.2 kg)  09/09/22 223 lb (101.2 kg)    The ASCVD Risk score (Arnett DK, et al., 2019) failed to calculate for the following reasons:   Risk score cannot be calculated because patient has a medical history suggesting prior/existing ASCVD  Objective:  Physical Exam      Assessment And Plan:  Type 2 diabetes mellitus with stage 3a chronic kidney disease, with long-term current use of insulin (HCC)  Hypertensive heart disease with chronic diastolic congestive heart failure (HCC)  Mixed hyperlipidemia  Essential hypertension    No follow-ups on file.  Patient was given opportunity to ask questions. Patient verbalized understanding of the plan and was able to repeat key elements of the plan. All questions were answered to their satisfaction.    Jeanell Sparrow, FNP, have reviewed all documentation for this visit. The documentation on 05/24/23 for the exam, diagnosis, procedures, and orders are all accurate and complete.   IF YOU HAVE BEEN REFERRED TO A SPECIALIST, IT MAY TAKE 1-2 WEEKS TO SCHEDULE/PROCESS THE REFERRAL. IF YOU HAVE NOT HEARD FROM US/SPECIALIST IN TWO WEEKS, PLEASE GIVE [place] A CALL AT [phone] X [phone].

## 2023-05-26 ENCOUNTER — Ambulatory Visit: Payer: Medicare Other | Admitting: Urology

## 2023-06-01 DIAGNOSIS — K080 Exfoliation of teeth due to systemic causes: Secondary | ICD-10-CM | POA: Diagnosis not present

## 2023-06-06 ENCOUNTER — Other Ambulatory Visit: Payer: Self-pay | Admitting: Nurse Practitioner

## 2023-06-06 ENCOUNTER — Telehealth: Payer: Self-pay | Admitting: Pharmacist

## 2023-06-06 ENCOUNTER — Telehealth: Payer: Self-pay

## 2023-06-06 MED ORDER — EMPAGLIFLOZIN 25 MG PO TABS: 25.0000 mg | ORAL_TABLET | Freq: Every day | ORAL | 3 refills | Status: DC

## 2023-06-06 NOTE — Progress Notes (Signed)
06/06/2023 Name: Robert Walters MRN: [id number] DOB: Nov 23, 1955  Chief Complaint  Patient presents with   Medication Assistance    Evaristo Bury, Rylesus and Richardson Dubree is a 68 y.o. year old male who presented for a telephone visit.   They were referred to the pharmacist by their PCP for assistance in managing  medication assistance questions .    Subjective:  Care Team: Primary Care Provider: Arnette Felts, FNP ; Next Scheduled Visit: 06/22/2023   Medication Access/Adherence  Current Pharmacy:  [facility] - [place], [place] - [address] [place] [address] Phone: [phone] Fax: [phone]  [facility] [place], [place] - [address] [place] [address] Phone: [phone] Fax: [phone]  [facility] - [place], [place] - [address] [place] [address] Phone: [phone] Fax: [phone]   Patient reports affordability concerns with their medications: Yes  Patient reports access/transportation concerns to their pharmacy: No  Patient reports adherence concerns with their medications:  No  COST   Diabetes:  Current medications:  Tresiba-55 units daily Jardiance 25mg  daily Rybelsus 14mg  daily    Patient denies hypoglycemic or hyperglucemic s/sx. A1c 6.3%  Objective:  Lab Results  Component Value Date   HGBA1C 6.3 (H) 01/11/2023    Lab Results  Component Value Date   CREATININE 1.30 (H) 01/11/2023   BUN 22 01/11/2023   NA 138 01/11/2023   K 4.8 01/11/2023   CL 100 01/11/2023   CO2 21 01/11/2023    Lab Results  Component Value Date   CHOL 123 01/11/2023   HDL 38 (L) 01/11/2023   LDLCALC 47 01/11/2023   LDLDIRECT 124.3 11/10/2010   TRIG 239 (H) 01/11/2023   CHOLHDL 3.2 01/11/2023    Medications Reviewed Today     Reviewed by [name], RPH (Pharmacist) on 06/06/23 at 1657  Med List Status: <None>    Medication Order Taking? Sig Documenting Provider Last Dose Status Informant  aspirin EC 81 MG tablet [id number] Yes Take 1 tablet (81 mg total) by mouth daily. Nahser, Deloris Ping, MD Taking Active Self  atorvastatin (LIPITOR) 80 MG tablet [id number] Yes Take 1 tablet (80 mg total) by mouth daily. Arnette Felts, FNP Taking Active   ciprofloxacin (CIPRO) tablet 500 mg [id number]   Bjorn Pippin, MD  Active   empagliflozin (JARDIANCE) 25 MG TABS tablet [id number] Yes Take 1 tablet (25 mg total) by mouth daily before breakfast. Arnette Felts, FNP Taking Active   ezetimibe (ZETIA) 10 MG tablet [id number] Yes Take 1 tablet (10 mg total) by mouth daily. Chrystie Nose, MD Taking Active   Finerenone [facility]) 10 MG TABS [id number] No Take 1 tablet (10 mg total) by mouth daily.  Patient not taking: Reported on 06/06/2023   Arnette Felts, FNP Not Taking Active            Med Note Gildardo Griffes Jun 06, 2023  4:53 PM) Could not afford  FLUoxetine (PROZAC) 40 MG capsule [id number] Yes Take 1 capsule (40 mg total) by mouth every morning. Melony Overly T, PA-C Taking Active   glucose blood (ONETOUCH ULTRA) test strip [id number] Yes USE 1 STRIP TO CHECK GLUCOSE 3 TIMES DAILY Arnette Felts, FNP Taking Active   insulin degludec (TRESIBA FLEXTOUCH) 100 UNIT/ML FlexTouch Pen [id number] Yes Inject 55 Units into the skin daily. Arnette Felts, FNP Taking Active  Insulin Pen Needle (PEN NEEDLES) 32G X 4 MM MISC [id number] Yes Use as directed Arnette Felts, FNP Taking Active   lamoTRIgine (LAMICTAL) 150 MG tablet [id number] Yes Take 1 tablet (150 mg total) by mouth 2 (two) times daily. Cherie Ouch, PA-C Taking Active   LORazepam (ATIVAN) 1 MG tablet [id number] Yes Take 1 tablet (1 mg total) by mouth every 8 (eight) hours as needed for anxiety. Melony Overly T, PA-C Taking Active   metFORMIN (GLUCOPHAGE) 1000 MG tablet [id number] Yes TAKE 1 TABLET BY MOUTH TWICE DAILY WITH MEALS Arnette Felts, FNP Taking Active    metoprolol tartrate (LOPRESSOR) 25 MG tablet [id number] Yes TAKE 1 TABLET BY MOUTH 2 TIMES DAILY. Mallipeddi, Vishnu P, MD Taking Active   Multiple Vitamin (MULTIVITAMIN WITH MINERALS) TABS tablet [id number] Yes Take 1 tablet by mouth daily. [provider] Taking Active Self  omega-3 acid ethyl esters (LOVAZA) 1 g capsule [id number] Yes Take 2 capsules (2 g total) by mouth 2 (two) times daily. Mallipeddi, Vishnu P, MD Taking Active   omeprazole (PRILOSEC) 40 MG capsule [id number] Yes Take 1 capsule (40 mg total) by mouth daily. Marguerita Merles, Reuel Boom, MD Taking Active   Semaglutide [facility]) 14 MG TABS [id number] Yes Take 1 tablet (14 mg total) by mouth daily at 6 (six) AM. Arnette Felts, FNP Taking Active   trospium (SANCTURA) 20 MG tablet [id number] Yes Take 1 tablet (20 mg total) by mouth 2 (two) times daily. Bjorn Pippin, MD Taking Active   valsartan (DIOVAN) 320 MG tablet [id number] Yes Take 1 tablet (320 mg total) by mouth daily. Mallipeddi, Orion Modest, MD Taking Active               Assessment/Plan:   Diabetes: -Meets financial criteria for Lynnell Chad, and Rybelsus patient assistance programs  through [employer] a. Will collaborate with provider, CPhT, and patient to follow up on  assistance.  Patient has already been approved through both programs. Needed a new prescription to sent to [facility]  Melynda Ripple sent the new script as requested.  [employer] has delayed shipping.  Tresea Mall, CPhT with the Med Assistance Team will continue to follow up.  A1c-6.3   [name], PharmD, BCACP Clinical Pharmacist [phone]

## 2023-06-06 NOTE — Telephone Encounter (Signed)
PAP: Patient assistance application for [place] has been approved by PAP Companies: [employer] from 05/26/2023 to 05/02/2024. Medication should be delivered to PAP Delivery: Home. For further shipping updates, please contact [employer] ([employer]) at [phone]. Patient ID is: no id

## 2023-06-06 NOTE — Telephone Encounter (Signed)
Patient called leaving a message for pharmacist to call him. Patient also has a question about his jardiance.

## 2023-06-08 ENCOUNTER — Other Ambulatory Visit: Payer: Self-pay | Admitting: Urology

## 2023-06-08 DIAGNOSIS — N3941 Urge incontinence: Secondary | ICD-10-CM

## 2023-06-17 ENCOUNTER — Telehealth: Payer: Self-pay

## 2023-06-17 ENCOUNTER — Other Ambulatory Visit: Payer: Self-pay

## 2023-06-17 ENCOUNTER — Other Ambulatory Visit (HOSPITAL_COMMUNITY): Payer: Self-pay

## 2023-06-17 DIAGNOSIS — Z794 Long term (current) use of insulin: Secondary | ICD-10-CM

## 2023-06-17 MED ORDER — TRESIBA FLEXTOUCH 100 UNIT/ML ~~LOC~~ SOPN
55.0000 [IU] | PEN_INJECTOR | Freq: Every day | SUBCUTANEOUS | 1 refills | Status: DC
Start: 2023-06-17 — End: 2023-07-05

## 2023-06-17 NOTE — Telephone Encounter (Addendum)
Patient called to inform me he was out of [place].  I did a three way call to [employer] with patient ans was able to get him approved for a voucher until his medication assistance came in.  A prescription was sent in to [facility] for a 30 Day supply.  The voucher info: [id number], JYN:WGNF, GROUP: [id number], ID [id number]

## 2023-06-22 ENCOUNTER — Ambulatory Visit: Payer: Medicare Other | Admitting: Nurse Practitioner

## 2023-06-27 DIAGNOSIS — M79672 Pain in left foot: Secondary | ICD-10-CM | POA: Diagnosis not present

## 2023-06-27 DIAGNOSIS — M79671 Pain in right foot: Secondary | ICD-10-CM | POA: Diagnosis not present

## 2023-06-27 DIAGNOSIS — E1140 Type 2 diabetes mellitus with diabetic neuropathy, unspecified: Secondary | ICD-10-CM | POA: Diagnosis not present

## 2023-06-27 DIAGNOSIS — I739 Peripheral vascular disease, unspecified: Secondary | ICD-10-CM | POA: Diagnosis not present

## 2023-07-04 DIAGNOSIS — K080 Exfoliation of teeth due to systemic causes: Secondary | ICD-10-CM | POA: Diagnosis not present

## 2023-07-05 ENCOUNTER — Ambulatory Visit: Payer: Medicare Other | Admitting: Nurse Practitioner

## 2023-07-05 ENCOUNTER — Encounter: Payer: Self-pay | Admitting: Nurse Practitioner

## 2023-07-05 VITALS — BP 112/76 | HR 94 | Temp 98.1°F | Ht 69.0 in | Wt 228.0 lb

## 2023-07-05 DIAGNOSIS — Z794 Long term (current) use of insulin: Secondary | ICD-10-CM | POA: Diagnosis not present

## 2023-07-05 DIAGNOSIS — Z6833 Body mass index (BMI) 33.0-33.9, adult: Secondary | ICD-10-CM

## 2023-07-05 DIAGNOSIS — E1122 Type 2 diabetes mellitus with diabetic chronic kidney disease: Secondary | ICD-10-CM | POA: Diagnosis not present

## 2023-07-05 DIAGNOSIS — Z79899 Other long term (current) drug therapy: Secondary | ICD-10-CM | POA: Diagnosis not present

## 2023-07-05 DIAGNOSIS — I130 Hypertensive heart and chronic kidney disease with heart failure and stage 1 through stage 4 chronic kidney disease, or unspecified chronic kidney disease: Secondary | ICD-10-CM

## 2023-07-05 DIAGNOSIS — E66811 Obesity, class 1: Secondary | ICD-10-CM

## 2023-07-05 DIAGNOSIS — E6609 Other obesity due to excess calories: Secondary | ICD-10-CM

## 2023-07-05 DIAGNOSIS — E782 Mixed hyperlipidemia: Secondary | ICD-10-CM | POA: Diagnosis not present

## 2023-07-05 DIAGNOSIS — I110 Hypertensive heart disease with heart failure: Secondary | ICD-10-CM

## 2023-07-05 DIAGNOSIS — N1831 Chronic kidney disease, stage 3a: Secondary | ICD-10-CM | POA: Diagnosis not present

## 2023-07-05 MED ORDER — TRESIBA FLEXTOUCH 200 UNIT/ML ~~LOC~~ SOPN: PEN_INJECTOR | SUBCUTANEOUS | Status: AC

## 2023-07-05 MED ORDER — TRESIBA FLEXTOUCH 200 UNIT/ML ~~LOC~~ SOPN: 55.0000 [IU] | PEN_INJECTOR | SUBCUTANEOUS | Status: DC

## 2023-07-05 NOTE — Patient Instructions (Signed)
 You can get over the counter biotin gel

## 2023-07-05 NOTE — Progress Notes (Addendum)
 I,Jameka J Llittleton, CMA,acting as a Neurosurgeon for [employer], FNP.,have documented all relevant documentation on the behalf of Arnette Felts, FNP,as directed by  Arnette Felts, FNP while in the presence of Arnette Felts, FNP.  Subjective:  Patient ID: Robert Walters , male    DOB: Oct 26, 1955 , 68 y.o.   MRN: [id number]  Chief Complaint  Patient presents with   Diabetes   Hypertension    HPI  Patient presents today for a DM follow up, patient reports compliance with medication. Patient denies any chest pain, SOB, or headaches. Patient has no concerns today.   Diabetes He presents for his follow-up diabetic visit. He has type 2 diabetes mellitus. His disease course has been stable. There are no hypoglycemic associated symptoms. Pertinent negatives for hypoglycemia include no dizziness or headaches. There are no diabetic associated symptoms. Pertinent negatives for diabetes include no polydipsia, no polyphagia and no polyuria. There are no hypoglycemic complications. Symptoms are stable. Diabetic complications include heart disease and nephropathy. Risk factors for coronary artery disease include diabetes mellitus, sedentary lifestyle, male sex and obesity. Current diabetic treatment includes oral agent (triple therapy) (Continues with tresiba, jardiance and rybelsus). He is compliant with treatment all of the time. He is following a generally unhealthy diet. He has not had a previous visit with a dietitian. He rarely participates in exercise. (Blood sugars he has not been checking recently) An ACE inhibitor/angiotensin II receptor blocker is being taken. He does not see a podiatrist.Eye exam is not current (He will see the eye doctor on October 7th).  Hypertension This is a chronic problem. The current episode started more than 1 year ago. The problem is unchanged. The problem is controlled. Pertinent negatives include no anxiety or headaches. There are no associated agents to hypertension. Risk factors  for coronary artery disease include obesity and sedentary lifestyle. There are no compliance problems.  There is no history of angina. There is no history of chronic renal disease.     Past Medical History:  Diagnosis Date   Acid reflux disease    Bipolar 1 disorder, mixed (HCC)    BPH (benign prostatic hypertrophy)    CVA (cerebral vascular accident) (HCC) 07/2015   Gastritis    H/O hiatal hernia    Hyperlipidemia    Hypertension    Hypertensive heart disease with chronic diastolic congestive heart failure (HCC) 01/19/2023   Normal nuclear stress test Dec 2011   Obesity    TIA (transient ischemic attack) 05/15/2012   "they think I've had one this am @ 0130" (05/15/2012)   Type II diabetes mellitus (HCC)    Vertigo      Family History  Problem Relation Age of Onset   Hyperlipidemia Father        in a nursing home   Colon cancer Father    Stroke Mother 50     Current Outpatient Medications:    aspirin EC 81 MG tablet, Take 1 tablet (81 mg total) by mouth daily., Disp: , Rfl:    atorvastatin (LIPITOR) 80 MG tablet, Take 1 tablet (80 mg total) by mouth daily., Disp: 90 tablet, Rfl: 2   empagliflozin (JARDIANCE) 25 MG TABS tablet, Take 1 tablet (25 mg total) by mouth daily before breakfast., Disp: 90 tablet, Rfl: 3   ezetimibe (ZETIA) 10 MG tablet, Take 1 tablet (10 mg total) by mouth daily., Disp: 90 tablet, Rfl: 1   Finerenone (KERENDIA) 10 MG TABS, Take 1 tablet (10 mg total) by mouth daily. (Patient  not taking: Reported on 07/07/2023), Disp: 90 tablet, Rfl: 1   FLUoxetine (PROZAC) 40 MG capsule, Take 1 capsule (40 mg total) by mouth every morning., Disp: 90 capsule, Rfl: 1   glucose blood (ONETOUCH ULTRA) test strip, USE 1 STRIP TO CHECK GLUCOSE 3 TIMES DAILY, Disp: 300 strip, Rfl: 3   Insulin Pen Needle (PEN NEEDLES) 32G X 4 MM MISC, Use as directed, Disp: 100 each, Rfl: 2   lamoTRIgine (LAMICTAL) 150 MG tablet, Take 1 tablet (150 mg total) by mouth 2 (two) times daily., Disp: 180  tablet, Rfl: 1   metFORMIN (GLUCOPHAGE) 1000 MG tablet, TAKE 1 TABLET BY MOUTH TWICE DAILY WITH MEALS, Disp: 180 tablet, Rfl: 1   metoprolol tartrate (LOPRESSOR) 25 MG tablet, TAKE 1 TABLET BY MOUTH 2 TIMES DAILY., Disp: 180 tablet, Rfl: 3   Multiple Vitamin (MULTIVITAMIN WITH MINERALS) TABS tablet, Take 1 tablet by mouth daily., Disp: , Rfl:    omega-3 acid ethyl esters (LOVAZA) 1 g capsule, Take 2 capsules (2 g total) by mouth 2 (two) times daily., Disp: 360 capsule, Rfl: 3   omeprazole (PRILOSEC) 40 MG capsule, Take 1 capsule (40 mg total) by mouth daily., Disp: 90 capsule, Rfl: 0   Semaglutide (RYBELSUS) 14 MG TABS, Take 1 tablet (14 mg total) by mouth daily at 6 (six) AM., Disp: 90 tablet, Rfl: 0   trospium (SANCTURA) 20 MG tablet, TAKE (1) TABLET BY MOUTH TWICE DAILY., Disp: 60 tablet, Rfl: 11   valsartan (DIOVAN) 320 MG tablet, Take 1 tablet (320 mg total) by mouth daily., Disp: 90 tablet, Rfl: 3   insulin degludec (TRESIBA FLEXTOUCH) 200 UNIT/ML FlexTouch Pen, Take 55 units SQ daily - Patient Assistance, Disp: , Rfl:    LORazepam (ATIVAN) 1 MG tablet, TAKE 1 TABLET BY MOUTH EVERY 8 HOURS AS NEEDED FOR ANXIETY, Disp: 270 tablet, Rfl: 0  Current Facility-Administered Medications:    ciprofloxacin (CIPRO) tablet 500 mg, 500 mg, Oral, Once, Bjorn Pippin, MD   No Known Allergies   Review of Systems  Constitutional: Negative.   Eyes: Negative.   Respiratory: Negative.    Cardiovascular: Negative.   Endocrine: Negative for polydipsia, polyphagia and polyuria.  Musculoskeletal: Negative.   Skin: Negative.   Neurological:  Negative for dizziness and headaches.  Psychiatric/Behavioral: Negative.       Today's Vitals   07/05/23 1207  BP: 112/76  Pulse: 94  Temp: 98.1 F (36.7 C)  TempSrc: Oral  Weight: 228 lb (103.4 kg)  Height: 5\' 9"  (1.753 m)  PainSc: 0-No pain   Body mass index is 33.67 kg/m.  Wt Readings from Last 3 Encounters:  07/05/23 228 lb (103.4 kg)  02/18/23 224 lb  (101.6 kg)  01/11/23 223 lb (101.2 kg)    Objective:  Physical Exam Vitals reviewed.  Constitutional:      General: He is not in acute distress.    Appearance: Normal appearance. He is obese.  Eyes:     Extraocular Movements: Extraocular movements intact.     Conjunctiva/sclera: Conjunctivae normal.     Pupils: Pupils are equal, round, and reactive to light.  Cardiovascular:     Rate and Rhythm: Normal rate and regular rhythm.     Pulses: Normal pulses.     Heart sounds: Normal heart sounds. No murmur heard. Pulmonary:     Effort: Pulmonary effort is normal. No respiratory distress.     Breath sounds: Normal breath sounds. No wheezing.  Skin:    General: Skin is warm and dry.  Capillary Refill: Capillary refill takes less than 2 seconds.  Neurological:     General: No focal deficit present.     Mental Status: He is alert and oriented to person, place, and time.     Cranial Nerves: No cranial nerve deficit.     Motor: No weakness.  Psychiatric:        Mood and Affect: Mood normal.        Behavior: Behavior normal.        Thought Content: Thought content normal.        Judgment: Judgment normal.    Assessment And Plan:  Type 2 diabetes mellitus with stage 3a chronic kidney disease, with long-term current use of insulin (HCC) Assessment & Plan: Hgba1c is up to 6.3 continue with [place], Rybelsus and Jardiance. I have given him his patient assistance with about 8 bottles of Rybelsus and 4 boxes of   Orders: -     Microalbumin / creatinine urine ratio -     CMP14+EGFR -     Hemoglobin A1c  Mixed hyperlipidemia Assessment & Plan: Cholesterol levels are stable. Continue current medications.   Orders: -     Lipid panel  Hypertensive heart and renal disease with congestive heart failure (HCC) Assessment & Plan: Blood pressure is well-controlled.  Continue current medications.  No issues or concerns with his congestive heart failure, no swelling noted. Continue f/u with  Cardiology   Class 1 obesity due to excess calories with serious comorbidity and body mass index (BMI) of 33.0 to 33.9 in adult Assessment & Plan: He is encouraged to strive for BMI less than 30 to decrease cardiac risk. Advised to aim for at least 150 minutes of exercise per week.    Other long term (current) drug therapy -     CBC -     TSH  Other orders -     Evaristo Bury FlexTouch; Take 55 units SQ daily - Patient Assistance    Return for HM 4 months.  Patient was given opportunity to ask questions. Patient verbalized understanding of the plan and was able to repeat key elements of the plan. All questions were answered to their satisfaction.    Jeanell Sparrow, FNP, have reviewed all documentation for this visit. The documentation on 07/05/23 for the exam, diagnosis, procedures, and orders are all accurate and complete.   IF YOU HAVE BEEN REFERRED TO A SPECIALIST, IT MAY TAKE 1-2 WEEKS TO SCHEDULE/PROCESS THE REFERRAL. IF YOU HAVE NOT HEARD FROM US/SPECIALIST IN TWO WEEKS, PLEASE GIVE [place] A CALL AT [phone] X [phone].

## 2023-07-05 NOTE — Assessment & Plan Note (Signed)
 Hgba1c is up to 6.3 continue with [place], Rybelsus and Jardiance. I have given him his patient assistance with about 8 bottles of Rybelsus and 4 boxes of

## 2023-07-06 LAB — CBC
Hematocrit: 45.5 % (ref 37.5–51.0)
Hemoglobin: 16 g/dL (ref 13.0–17.7)
MCH: 32.2 pg (ref 26.6–33.0)
MCHC: 35.2 g/dL (ref 31.5–35.7)
MCV: 92 fL (ref 79–97)
Platelets: 191 10*3/uL (ref 150–450)
RBC: 4.97 x10E6/uL (ref 4.14–5.80)
RDW: 14 % (ref 11.6–15.4)
WBC: 8.4 10*3/uL (ref 3.4–10.8)

## 2023-07-06 LAB — LIPID PANEL
Chol/HDL Ratio: 3.2 ratio (ref 0.0–5.0)
Cholesterol, Total: 162 mg/dL (ref 100–199)
HDL: 50 mg/dL (ref >39)
LDL Chol Calc (NIH): 63 mg/dL (ref 0–99)
Triglycerides: 315 mg/dL — ABNORMAL HIGH (ref 0–149)
VLDL Cholesterol Cal: 49 mg/dL — ABNORMAL HIGH (ref 5–40)

## 2023-07-06 LAB — HEMOGLOBIN A1C
Est. average glucose Bld gHb Est-mCnc: 154 mg/dL
Hgb A1c MFr Bld: 7 % — ABNORMAL HIGH (ref 4.8–5.6)

## 2023-07-06 LAB — CMP14+EGFR
ALT: 48 IU/L — ABNORMAL HIGH (ref 0–44)
AST: 37 IU/L (ref 0–40)
Albumin: 5.1 g/dL — ABNORMAL HIGH (ref 3.9–4.9)
Alkaline Phosphatase: 162 IU/L — ABNORMAL HIGH (ref 44–121)
BUN/Creatinine Ratio: 20 (ref 10–24)
BUN: 31 mg/dL — ABNORMAL HIGH (ref 8–27)
Bilirubin Total: 0.8 mg/dL (ref 0.0–1.2)
CO2: 23 mmol/L (ref 20–29)
Calcium: 10.5 mg/dL — ABNORMAL HIGH (ref 8.6–10.2)
Chloride: 102 mmol/L (ref 96–106)
Creatinine, Ser: 1.55 mg/dL — ABNORMAL HIGH (ref 0.76–1.27)
Globulin, Total: 2.3 g/dL (ref 1.5–4.5)
Glucose: 163 mg/dL — ABNORMAL HIGH (ref 70–99)
Potassium: 5.1 mmol/L (ref 3.5–5.2)
Sodium: 142 mmol/L (ref 134–144)
Total Protein: 7.4 g/dL (ref 6.0–8.5)
eGFR: 48 mL/min/{1.73_m2} — ABNORMAL LOW (ref >59)

## 2023-07-06 LAB — MICROALBUMIN / CREATININE URINE RATIO
Creatinine, Urine: 80.1 mg/dL
Microalb/Creat Ratio: 39 mg/g{creat} — ABNORMAL HIGH (ref 0–29)
Microalbumin, Urine: 31.6 ug/mL

## 2023-07-06 LAB — TSH: TSH: 1.22 u[IU]/mL (ref 0.450–4.500)

## 2023-07-07 ENCOUNTER — Telehealth: Payer: Self-pay

## 2023-07-07 ENCOUNTER — Telehealth: Payer: Self-pay | Admitting: Pharmacist

## 2023-07-07 DIAGNOSIS — E1122 Type 2 diabetes mellitus with diabetic chronic kidney disease: Secondary | ICD-10-CM

## 2023-07-07 NOTE — Telephone Encounter (Signed)
 PAP: Patient assistance application for Robert Walters through [employer] has been mailed to pt's home address on file. Provider portion of application will be faxed to provider's office.

## 2023-07-07 NOTE — Progress Notes (Signed)
 07/07/2023 Name: Robert Walters MRN: [id number] DOB: 07-Aug-1955  Chief Complaint  Patient presents with   Medication Assistance    Robert Walters is a 69 y.o. year old male who presented for a telephone visit.   They were referred to the pharmacist by their PCP for assistance in managing medication access.    Subjective:  Care Team: Primary Care Provider: Arnette Felts, FNP ; Next Scheduled Visit: 11/16/23 Urology 08/11/23  Medication Access/Adherence  Current Pharmacy:  [facility] - [place], [place] - [address] [place] [address] Phone: [phone] Fax: [phone]  [facility] [place], [place] - [address] [place] [address] Phone: [phone] Fax: [phone]  [place], [place] - [address] [place] [address] Phone: [phone] Fax: [phone]   Patient reports affordability concerns with their medications: Yes Kerendia Patient reports access/transportation concerns to their pharmacy: No  Patient reports adherence concerns with their medications:  Yes  Did not pick up [name] due to cost   Patient reported receiving his Rybelsus and [place] through [employer].  A message will be sent to the Medication Assistance Team to send over a new application for [name] through [name].   Objective:  Lab Results  Component Value Date   HGBA1C 7.0 (H) 07/05/2023    Lab Results  Component Value Date   CREATININE 1.55 (H) 07/05/2023   BUN 31 (H) 07/05/2023   NA 142 07/05/2023   K 5.1 07/05/2023   CL 102 07/05/2023   CO2 23 07/05/2023    Lab Results  Component Value Date   CHOL 162 07/05/2023   HDL 50 07/05/2023   LDLCALC 63 07/05/2023   LDLDIRECT 124.3 11/10/2010   TRIG 315 (H) 07/05/2023   CHOLHDL 3.2 07/05/2023    Medications Reviewed Today     Reviewed by [name], RPH  (Pharmacist) on 07/07/23 at 1429  Med List Status: <None>   Medication Order Taking? Sig Documenting Provider Last Dose Status Informant  aspirin EC 81 MG tablet [id number] Yes Take 1 tablet (81 mg total) by mouth daily. Nahser, Deloris Ping, MD Taking Active Self  atorvastatin (LIPITOR) 80 MG tablet [id number] Yes Take 1 tablet (80 mg total) by mouth daily. Arnette Felts, FNP Taking Active   ciprofloxacin (CIPRO) tablet 500 mg [id number]   Bjorn Pippin, MD  Active   empagliflozin (JARDIANCE) 25 MG TABS tablet [id number] Yes Take 1 tablet (25 mg total) by mouth daily before breakfast. Arnette Felts, FNP Taking Active   ezetimibe (ZETIA) 10 MG tablet [id number] Yes Take 1 tablet (10 mg total) by mouth daily. Chrystie Nose, MD Taking Active   Finerenone [facility]) 10 MG TABS [id number] No Take 1 tablet (10 mg total) by mouth daily.  Patient not taking: Reported on 07/07/2023   Arnette Felts, FNP Not Taking Active            Med Note Gildardo Griffes Jun 06, 2023  4:53 PM) Could not afford  FLUoxetine (PROZAC) 40 MG capsule [id number] Yes Take 1 capsule (40 mg total) by mouth every morning. Melony Overly T, PA-C Taking Active   glucose blood (ONETOUCH ULTRA) test strip [id number] Yes USE 1 STRIP TO CHECK GLUCOSE 3 TIMES DAILY Arnette Felts, FNP Taking Active   insulin degludec (TRESIBA FLEXTOUCH) 200 UNIT/ML FlexTouch Pen [id number] Yes Take 55  units SQ daily - Patient Assistance Arnette Felts, FNP Taking Active   Insulin Pen Needle (PEN NEEDLES) 32G X 4 MM MISC [id number] Yes Use as directed Arnette Felts, FNP Taking Active   lamoTRIgine (LAMICTAL) 150 MG tablet [id number] Yes Take 1 tablet (150 mg total) by mouth 2 (two) times daily. Cherie Ouch, PA-C Taking Active   LORazepam (ATIVAN) 1 MG tablet [id number] Yes Take 1 tablet (1 mg total) by mouth every 8 (eight) hours as needed for anxiety. Melony Overly T, PA-C Taking Active   metFORMIN (GLUCOPHAGE) 1000 MG tablet [id number] Yes TAKE 1 TABLET BY MOUTH  TWICE DAILY WITH MEALS Arnette Felts, FNP Taking Active   metoprolol tartrate (LOPRESSOR) 25 MG tablet [id number] Yes TAKE 1 TABLET BY MOUTH 2 TIMES DAILY. Mallipeddi, Vishnu P, MD Taking Active   Multiple Vitamin (MULTIVITAMIN WITH MINERALS) TABS tablet [id number] Yes Take 1 tablet by mouth daily. [provider] Taking Active Self  omega-3 acid ethyl esters (LOVAZA) 1 g capsule [id number] Yes Take 2 capsules (2 g total) by mouth 2 (two) times daily. Mallipeddi, Vishnu P, MD Taking Active   omeprazole (PRILOSEC) 40 MG capsule [id number] Yes Take 1 capsule (40 mg total) by mouth daily. Marguerita Merles, Reuel Boom, MD Taking Active   Semaglutide [facility]) 14 MG TABS [id number] Yes Take 1 tablet (14 mg total) by mouth daily at 6 (six) AM. Arnette Felts, FNP Taking Active   trospium (SANCTURA) 20 MG tablet [id number] Yes TAKE (1) TABLET BY MOUTH TWICE DAILY. Bjorn Pippin, MD Taking Active   valsartan (DIOVAN) 320 MG tablet [id number] Yes Take 1 tablet (320 mg total) by mouth daily. Mallipeddi, Orion Modest, MD Taking Active               Assessment/Plan:   Send note to Pharmacy Medication Assistance Team to send patient a new application for Clarise Cruz through [facility] Patient Assistance Program.  Follow Up Plan:    Follow up on the application process in 3 weeks.  [name], PharmD, BCACP Clinical Pharmacist [phone]

## 2023-07-15 DIAGNOSIS — E782 Mixed hyperlipidemia: Secondary | ICD-10-CM | POA: Insufficient documentation

## 2023-07-15 DIAGNOSIS — E66811 Obesity, class 1: Secondary | ICD-10-CM | POA: Insufficient documentation

## 2023-07-15 NOTE — Assessment & Plan Note (Signed)
 He is encouraged to strive for BMI less than 30 to decrease cardiac risk. Advised to aim for at least 150 minutes of exercise per week.

## 2023-07-15 NOTE — Assessment & Plan Note (Signed)
 Cholesterol levels are stable. Continue current medications

## 2023-07-15 NOTE — Assessment & Plan Note (Signed)
 Blood pressure is well-controlled.  Continue current medications.  No issues or concerns with his congestive heart failure, no swelling noted. Continue f/u with Cardiology

## 2023-07-18 ENCOUNTER — Ambulatory Visit: Payer: Self-pay | Admitting: Nurse Practitioner

## 2023-07-18 NOTE — Telephone Encounter (Signed)
 Copied from CRM [id number]. Topic: Clinical - Lab/Test Results >> Jul 18, 2023  3:36 PM Shon Hale wrote: Reason for CRM: Read patient results. Patient has additional questions. Had questions on labs and this RN was able to answer his questions. Reason for Disposition  Nursing judgment or information in reference  Answer Assessment - Initial Assessment Questions 1. REASON FOR CALL: "What is your main concern right now?"     Lab results  Protocols used: No Guideline Available-A-AH

## 2023-07-19 NOTE — Telephone Encounter (Signed)
 PAP: Application for [name] has been submitted to [name], via fax

## 2023-07-20 ENCOUNTER — Telehealth: Payer: Self-pay | Admitting: Pharmacist

## 2023-07-20 NOTE — Progress Notes (Signed)
   07/20/2023  Patient ID: [name], male   DOB: 26-Dec-1955, 68 y.o.   MRN: [id number]  Received letter from [place] Patient assistance program for Clarise Cruz stating the following items were missing form the Patient's application: Quantity Financial Authorization check box And a copy of the patient's medicare A&B card.  Spoke with Emilee Hero, CPhT with the Medication Assistance Team to rectify this situation.  Corrections will be made and the application will be resubmitted.  Plan: Follow up with Patient in 2-3 weeks for status.  [name], PharmD, BCACP Clinical Pharmacist [phone]

## 2023-07-21 ENCOUNTER — Telehealth: Payer: Medicare Other | Admitting: Physician Assistant

## 2023-07-22 ENCOUNTER — Other Ambulatory Visit: Payer: Self-pay | Admitting: Physician Assistant

## 2023-07-25 NOTE — Progress Notes (Signed)
 Updated.

## 2023-07-26 NOTE — Telephone Encounter (Signed)
 Reached out to Patient and he has to contact [employer] and answer questions to see if he will qualify for the PAP and If not he will have to apply for LIS and he was provided with the contact # for that as well.

## 2023-07-27 ENCOUNTER — Telehealth: Payer: Self-pay | Admitting: Pharmacist

## 2023-07-27 DIAGNOSIS — E118 Type 2 diabetes mellitus with unspecified complications: Secondary | ICD-10-CM

## 2023-07-27 NOTE — Progress Notes (Signed)
   07/27/2023  Patient ID: Robert Walters, male   DOB: [birth year], 68 y.o.   MRN: [id number]  Patient was called to follow up on medication assistance for [name].  [employer] has some questions about the Patient's income.  The Patient was required to call them to answer their questions.  Patient MAY have to apply for LIS/Extra Help based on their determination.  Unfortunately, Patient did not answer the phone. HIPAA compliant message could not be left as the voicemail did not pick up.  Phone rang >20 times.  Plan: Call patient back tomorrow.  [name], PharmD, BCACP Clinical Pharmacist [phone]

## 2023-07-28 ENCOUNTER — Telehealth: Payer: Self-pay | Admitting: Pharmacist

## 2023-07-28 DIAGNOSIS — E1169 Type 2 diabetes mellitus with other specified complication: Secondary | ICD-10-CM

## 2023-07-28 NOTE — Progress Notes (Signed)
   07/28/2023  Patient ID: Robert Walters, male   DOB: 1956-03-23, 68 y.o.   MRN: [id number]  Patient was called regarding his [name] application.  HIPAA identifiers were obtained.  Called [name] Patient Assistance Program. According to the [name], the Patient needs to verify his income by sending in his Social Security Benefits Awards letter and sending notification that he only has 1 person in his household.  The [name] number for the Patient is:  [id number]  When the SS Benefits Awards Letter is sent, the Patient ID and household size will need to be included with the documentation.  Spoke with Tresea Mall, CPhT with the RX Medication Assistance Team.  She will send the patient a return address envelope to send the information back to [place].   Plan: Follow up in 2-3 weeks.   [name], PharmD, BCACP Clinical Pharmacist [phone]

## 2023-07-28 NOTE — Telephone Encounter (Addendum)
 Mailed letter requesting Social Security statement along with return envelope.  When returned   This letter needs to be faxed to [employer] along with notation that patient has a household of 1.  His [name] number is [id number]

## 2023-08-02 DIAGNOSIS — E119 Type 2 diabetes mellitus without complications: Secondary | ICD-10-CM | POA: Diagnosis not present

## 2023-08-11 ENCOUNTER — Encounter: Payer: Self-pay | Admitting: Urology

## 2023-08-11 ENCOUNTER — Ambulatory Visit: Payer: Medicare Other | Admitting: Urology

## 2023-08-11 VITALS — BP 113/69 | HR 111

## 2023-08-11 DIAGNOSIS — N3941 Urge incontinence: Secondary | ICD-10-CM | POA: Diagnosis not present

## 2023-08-11 DIAGNOSIS — N5201 Erectile dysfunction due to arterial insufficiency: Secondary | ICD-10-CM

## 2023-08-11 DIAGNOSIS — N138 Other obstructive and reflux uropathy: Secondary | ICD-10-CM | POA: Diagnosis not present

## 2023-08-11 DIAGNOSIS — N401 Enlarged prostate with lower urinary tract symptoms: Secondary | ICD-10-CM | POA: Diagnosis not present

## 2023-08-11 DIAGNOSIS — R351 Nocturia: Secondary | ICD-10-CM | POA: Diagnosis not present

## 2023-08-11 DIAGNOSIS — R3912 Poor urinary stream: Secondary | ICD-10-CM | POA: Diagnosis not present

## 2023-08-11 MED ORDER — TROSPIUM CHLORIDE 20 MG PO TABS: 20.0000 mg | ORAL_TABLET | Freq: Two times a day (BID) | ORAL | 3 refills | Status: AC

## 2023-08-11 NOTE — Telephone Encounter (Signed)
 Received cost of living statement from patient in mail, submitted to [employer] for review of proof of income

## 2023-08-11 NOTE — Progress Notes (Unsigned)
 Subjective: 1. BPH with urinary obstruction   2. Urge incontinence   3. Nocturia   4. Weak urinary stream   5. Erectile dysfunction due to arterial insufficiency     08/11/23: Robert Walters returns today in f/u for his history of ED and BOO with LUTs. He was put on trospium at his last visit and was encouraged to use tadalafil and sildenafil in combination for the ED.   He is doing ok with the trospium.  He has nocturia x 3.  He has no incontinence but he can have some urgency.  His IPSS is 13 with a reduced stream.  He has some constipation.  His UA is clear and his PVR is 30ml.  05/20/22: Robert Walters returns today in f/u for his history fo ED and LUTS.   He had a tight BN on cystoscopy.  He  the urodynamics done.  His capacity was with a 1st sens at .  He had DI with UUI.  His PF of 41ml/sec with a pDet of 18cm/H20.  His PVR was .   He was given Gemtesa samples at his last visit and that has improvement in the urgency and nocturia.  His UA has 6-10 WBC.   04/01/22: Robert Walters returns today in f/u for voiding studies to assess his response to tadalafil for daily use.  He didn't have much response to the med for the erections or the LUTS.  His IPSS is 21 with nocturia x 4 and a weak stream.  He still has some UUI.  His PVR today was 31ml but he had inadequate urine for a flowrate.  He had a CT AP in 2018 and the prostate at that time measured 25ml.   A repeat FR after cysto had a sawtooth flow curve with a PF of 35ml/sec  02/04/22: Robert Walters is a 68 yo male who was last seen by Dr. Joie Narrow in 3/20.  He has a history of BPH with BOO and ED.  He is has been off of the alfuzosin for the last year.  He has been on sildenafil in the past.  His last PSA was stable at 0.6 in 8/22.  He has CKD3a with a Cr of 1.57 on 11/16/21.  He is a diabetic and is on Jardiance as well as several other meds.  He had a renal US  in 8/20 that showed findings consistent with medical renal disease.    He returns today with  progressive frequency and urgency with UUI over the past 7-8 months.  He has nocturia 4-5x.  He has a weak stream with intermittency and has a sensation of incomplete empty.   He has no neuropathy.   He has had a TIA.    IPSS     Row Name 08/11/23 1500         International Prostate Symptom Score   How often have you had the sensation of not emptying your bladder? Less than half the time     How often have you had to urinate less than every two hours? Less than 1 in 5 times     How often have you found you stopped and started again several times when you urinated? Less than 1 in 5 times     How often have you found it difficult to postpone urination? Less than half the time     How often have you had a weak urinary stream? About half the time     How often have you had to strain  to start urination? Less than 1 in 5 times     How many times did you typically get up at night to urinate? 3 Times     Total IPSS Score 13       Quality of Life due to urinary symptoms   If you were to spend the rest of your life with your urinary condition just the way it is now how would you feel about that? Mostly Satisfied                ROS:  Review of Systems  Constitutional:  Positive for malaise/fatigue.  Gastrointestinal:  Positive for heartburn.  Genitourinary:  Positive for frequency.  All other systems reviewed and are negative.   No Known Allergies  Past Medical History:  Diagnosis Date   Acid reflux disease    Bipolar 1 disorder, mixed (HCC)    BPH (benign prostatic hypertrophy)    CVA (cerebral vascular accident) (HCC) 07/2015   Gastritis    H/O hiatal hernia    Hyperlipidemia    Hypertension    Hypertensive heart disease with chronic diastolic congestive heart failure (HCC) 01/19/2023   Normal nuclear stress test Dec 2011   Obesity    TIA (transient ischemic attack) 05/15/2012   "they think I've had one this am @ 0130" (05/15/2012)   Type II diabetes mellitus (HCC)     Vertigo     Past Surgical History:  Procedure Laterality Date   BILIARY STENT PLACEMENT N/A 09/17/2016   Procedure: BILIARY STENT PLACEMENT;  Surgeon: Ruby Corporal, MD;  [facility];  Service: Endoscopy;  Laterality: N/A;   CARDIOVASCULAR STRESS TEST  2009   EF 63%   CHOLECYSTECTOMY N/A 08/25/2016   Procedure: LAPAROSCOPIC CHOLECYSTECTOMY;  Surgeon: Alanda Allegra, MD;  Location: [facility];  Service: General;  Laterality: N/A;   COLONOSCOPY     COLONOSCOPY WITH PROPOFOL N/A 02/03/2021   Procedure: COLONOSCOPY WITH PROPOFOL;  Surgeon: Urban Garden, MD;  [facility];  Service: Gastroenterology;  Laterality: N/A;    incomplete colonoscopy poor prep   COLONOSCOPY WITH PROPOFOL N/A 02/04/2021   Procedure: COLONOSCOPY WITH PROPOFOL;  Surgeon: Urban Garden, MD;  [facility];  Service: Gastroenterology;  Laterality: N/A;  1:45   ERCP N/A 09/17/2016   Procedure: ENDOSCOPIC RETROGRADE CHOLANGIOPANCREATOGRAPHY (ERCP);  Surgeon: Ruby Corporal, MD;  [facility];  Service: Endoscopy;  Laterality: N/A;  730   ERCP N/A 12/03/2016   Procedure: ENDOSCOPIC RETROGRADE CHOLANGIOPANCREATOGRAPHY (ERCP);  Surgeon: Ruby Corporal, MD;  [facility];  Service: Gastroenterology;  Laterality: N/A;   ESOPHAGEAL DILATION  02/03/2021   Procedure: ESOPHAGEAL DILATION;  Surgeon: Umberto Ganong, Bearl Limes, MD;  [facility];  Service: Gastroenterology;;  savory 16 and 18 fr   ESOPHAGOGASTRODUODENOSCOPY     ESOPHAGOGASTRODUODENOSCOPY (EGD) WITH PROPOFOL N/A 02/03/2021   Procedure: ESOPHAGOGASTRODUODENOSCOPY (EGD) WITH PROPOFOL;  Surgeon: Urban Garden, MD;  [facility];  Service: Gastroenterology;  Laterality: N/A;   GASTROINTESTINAL STENT REMOVAL N/A 12/03/2016   Procedure: BILIARY STENT REMOVAL;  Surgeon: Ruby Corporal, MD;  [facility];  Service: Gastroenterology;  Laterality: N/A;   NO PAST  SURGERIES     POLYPECTOMY  02/04/2021   Procedure: POLYPECTOMY INTESTINAL;  Surgeon: Urban Garden, MD;  [facility];  Service: Gastroenterology;;   SPHINCTEROTOMY N/A 09/17/2016   Procedure: [address];  Surgeon: Ruby Corporal, MD;  [facility];  Service: Endoscopy;  Laterality: N/A;    Social History   Socioeconomic History   Marital status: Widowed    Spouse name: Not on file   Number of children: Not on file   Years of education: Not on file   Highest education level: Not on file  Occupational History   Occupation: disabled  Tobacco Use   Smoking status: Never   Smokeless tobacco: Never  Vaping Use   Vaping status: Never Used  Substance and Sexual Activity   Alcohol use: No   Drug use: No   Sexual activity: Not Currently  Other Topics Concern   Not on file  Social History Narrative   Not on file   Social Drivers of Health   Financial Resource Strain: Low Risk  (05/25/2022)   Overall Financial Resource Strain (CARDIA)    Difficulty of Paying Living Expenses: Not hard at all  Food Insecurity: No Food Insecurity (05/25/2022)   Hunger Vital Sign    Worried About Running Out of Food in the Last Year: Never true    Ran Out of Food in the Last Year: Never true  Transportation Needs: No Transportation Needs (05/25/2022)   PRAPARE - Administrator, Civil Service (Medical): No    Lack of Transportation (Non-Medical): No  Physical Activity: Inactive (05/25/2022)   Exercise Vital Sign    Days of Exercise per Week: 0 days    Minutes of Exercise per Session: 0 min  Stress: No Stress Concern Present (05/25/2022)   [name] of Occupational Health - Occupational Stress Questionnaire    Feeling of Stress : Not at all  Social Connections: Socially Isolated (05/25/2022)   Social Connection and Isolation Panel [NHANES]    Frequency of Communication with Friends and Family: More than three times a week    Frequency of Social  Gatherings with Friends and Family: Once a week    Attends Religious Services: Never    Database administrator or Organizations: No    Attends Banker Meetings: Never    Marital Status: Widowed  Intimate Partner Violence: Not At Risk (05/25/2022)   Humiliation, Afraid, Rape, and Kick questionnaire    Fear of Current or Ex-Partner: No    Emotionally Abused: No    Physically Abused: No    Sexually Abused: No    Family History  Problem Relation Age of Onset   Hyperlipidemia Father        in a nursing home   Colon cancer Father    Stroke Mother 46    Anti-infectives: Anti-infectives (From admission, onward)    None       Current Outpatient Medications  Medication Sig Dispense Refill   aspirin EC 81 MG tablet Take 1 tablet (81 mg total) by mouth daily.     atorvastatin (LIPITOR) 80 MG tablet Take 1 tablet (80 mg total) by mouth daily. 90 tablet 2   empagliflozin (JARDIANCE) 25 MG TABS tablet Take 1 tablet (25 mg total) by mouth daily before breakfast. 90 tablet 3   ezetimibe (ZETIA) 10 MG tablet Take 1 tablet (10 mg total) by mouth daily. 90 tablet 1   FLUoxetine (PROZAC) 40 MG capsule Take 1 capsule (40 mg total) by mouth every morning. 90 capsule 1   glucose blood (ONETOUCH ULTRA) test strip USE 1 STRIP TO CHECK GLUCOSE 3 TIMES DAILY 300 strip 3   insulin degludec (TRESIBA FLEXTOUCH) 200 UNIT/ML FlexTouch Pen Take 55 units SQ daily - Patient Assistance  Insulin Pen Needle (PEN NEEDLES) 32G X 4 MM MISC Use as directed 100 each 2   lamoTRIgine (LAMICTAL) 150 MG tablet Take 1 tablet (150 mg total) by mouth 2 (two) times daily. 180 tablet 1   LORazepam (ATIVAN) 1 MG tablet TAKE 1 TABLET BY MOUTH EVERY 8 HOURS AS NEEDED FOR ANXIETY 270 tablet 0   metFORMIN (GLUCOPHAGE) 1000 MG tablet TAKE 1 TABLET BY MOUTH TWICE DAILY WITH MEALS 180 tablet 1   metoprolol tartrate (LOPRESSOR) 25 MG tablet TAKE 1 TABLET BY MOUTH 2 TIMES DAILY. 180 tablet 3   Multiple Vitamin  (MULTIVITAMIN WITH MINERALS) TABS tablet Take 1 tablet by mouth daily.     omega-3 acid ethyl esters (LOVAZA) 1 g capsule Take 2 capsules (2 g total) by mouth 2 (two) times daily. 360 capsule 3   omeprazole (PRILOSEC) 40 MG capsule Take 1 capsule (40 mg total) by mouth daily. 90 capsule 0   Semaglutide (RYBELSUS) 14 MG TABS Take 1 tablet (14 mg total) by mouth daily at 6 (six) AM. 90 tablet 0   valsartan (DIOVAN) 320 MG tablet Take 1 tablet (320 mg total) by mouth daily. 90 tablet 3   trospium (SANCTURA) 20 MG tablet Take 1 tablet (20 mg total) by mouth 2 (two) times daily. 180 tablet 3   No current facility-administered medications for this visit.     Objective: Vital signs in last 24 hours: BP 113/69   Pulse (!) 111   Intake/Output from previous day: No intake/output data recorded. Intake/Output this shift: @IOTHISSHIFT @   Physical Exam Vitals reviewed.  Constitutional:      Appearance: Normal appearance.  Neurological:     Mental Status: He is alert.     Lab Results:  No results found for this or any previous visit (from the past 24 hours).    BMET No results for input(s): "NA", "K", "CL", "CO2", "GLUCOSE", "BUN", "CREATININE", "CALCIUM" in the last 72 hours. PT/INR No results for input(s): "LABPROT", "INR" in the last 72 hours. ABG No results for input(s): "PHART", "HCO3" in the last 72 hours.  Invalid input(s): "PCO2", "PO2"  Studies/Results: No results found.    Assessment/Plan: BPH with BOO and OAB. He is better with Gemtesa but it is expensive for him.  He is doing better with trospium.  PSA today  ED.   He had no response to oral meds.  I discussed VED and IPP and he will think about it.   Meds ordered this encounter  Medications   trospium (SANCTURA) 20 MG tablet    Sig: Take 1 tablet (20 mg total) by mouth 2 (two) times daily.    Dispense:  180 tablet    Refill:  3     Orders Placed This Encounter  Procedures   PSA, total and free   BLADDER  SCAN AMB NON-IMAGING     Return in about 1 year (around 08/10/2024) for with any available provider.  .    CC: Susanna Epley FNP     Homero Luster 4/12/2025Patient ID: [name], male   DOB: May 12, 1955, 68 y.o.   MRN: [id number] Patient ID: TORIN MODICA, male   DOB: 09/17/1955, 68 y.o.   MRN: [id number]

## 2023-08-11 NOTE — Progress Notes (Signed)
 Bladder Scan completed today.  Patient cannot void prior to the bladder scan. Bladder scan result: 30  Performed By: Melvenia Stabs. CMA  Additional notes-

## 2023-08-12 LAB — PSA, TOTAL AND FREE
PSA, Free Pct: 45 %
PSA, Free: 0.18 ng/mL
Prostate Specific Ag, Serum: 0.4 ng/mL (ref 0.0–4.0)

## 2023-08-15 ENCOUNTER — Telehealth: Payer: Self-pay

## 2023-08-15 LAB — BLADDER SCAN AMB NON-IMAGING: Scan Result: 30

## 2023-08-15 NOTE — Telephone Encounter (Signed)
-----   Message from Homero Luster sent at 08/13/2023  9:34 AM EDT ----- PSA remains low and is down from the last. ----- Message ----- From: Dyke Glasser, CMA Sent: 08/12/2023   8:03 AM EDT To: Homero Luster, MD  Please review.

## 2023-08-15 NOTE — Telephone Encounter (Signed)
 Called Pt to give PSA result Pt said thank you that's great news

## 2023-08-17 ENCOUNTER — Telehealth: Payer: Self-pay | Admitting: Pharmacist

## 2023-08-17 DIAGNOSIS — E118 Type 2 diabetes mellitus with unspecified complications: Secondary | ICD-10-CM

## 2023-08-17 NOTE — Progress Notes (Signed)
   08/17/2023  Patient ID: Robert Walters, male   DOB: Feb 10, 1956, 69 y.o.   MRN: [id number]  [facility] Patient Assistance Program to investigate the status of the Patient's Application.  Trenton Frock, CPhT documented that documentation about the Patient's income had been sent to the Program.  The Representative from the Program said they did not have any correspondence.  However, the representative said what they need to get the Patient approved is a denial letter for LIS/Extra Help. (Spoke with a representative from the [employer] dispensing Pharmacy section as well as the foundation).  Patient was called to complete the application online.  Unfortunately, he did not answer the phone.  In addition, his voicemail did not pick up.  Plan: Call patient back in 1 week.   Geronimo Krabbe, PharmD, BCACP Clinical Pharmacist [phone]

## 2023-08-22 NOTE — Telephone Encounter (Signed)
 Reached out to [name] for Status on APP and they did not need the income document that was sent- Patient has to apply for LIS since he is in the 131% poverty level before [employer] will proceed any further. Spoke to Mr. Kithcart and he will call SS dept. About LIS.

## 2023-08-24 ENCOUNTER — Telehealth: Payer: Self-pay | Admitting: Pharmacist

## 2023-08-24 DIAGNOSIS — E118 Type 2 diabetes mellitus with unspecified complications: Secondary | ICD-10-CM

## 2023-08-24 NOTE — Progress Notes (Signed)
   08/24/2023  Patient ID: Robert Walters, male   DOB: [birth year], 68 y.o.   MRN: [id number]  Patient was called regarding patient assistance with Kerendia .  According to the [employer], [name] needs to apply and be denied LIS prior to being approved to get Kerendia .  The LIS/Extra Help application was completed online with the Patient via telephone.  Plan: Request a return addressed envelope be mailed to the patient with a letter requesting a copy of the LIS/Extra Help denial letter be mailed back to the Med Assistance Team office so it can be faxed to [name].  Follow up in 1 month.   Geronimo Krabbe, PharmD, BCACP Clinical Pharmacist [phone]

## 2023-08-30 ENCOUNTER — Other Ambulatory Visit (HOSPITAL_BASED_OUTPATIENT_CLINIC_OR_DEPARTMENT_OTHER): Payer: Self-pay | Admitting: Internal Medicine

## 2023-09-01 ENCOUNTER — Other Ambulatory Visit: Payer: Self-pay

## 2023-09-01 DIAGNOSIS — E119 Type 2 diabetes mellitus without complications: Secondary | ICD-10-CM | POA: Diagnosis not present

## 2023-09-01 MED ORDER — ATORVASTATIN CALCIUM 80 MG PO TABS: 80.0000 mg | ORAL_TABLET | Freq: Every day | ORAL | 2 refills | Status: AC

## 2023-09-08 NOTE — Telephone Encounter (Signed)
 Spoke to Patient and LIS was applied for and he is waiting on the results-

## 2023-09-15 NOTE — Telephone Encounter (Signed)
 PAP: Application for Kerendia  has been submitted to [name], via fax with Copy of LIS denial letter and other documents that was sent with first application.

## 2023-09-22 ENCOUNTER — Telehealth: Admitting: Physician Assistant

## 2023-09-23 ENCOUNTER — Ambulatory Visit (INDEPENDENT_AMBULATORY_CARE_PROVIDER_SITE_OTHER): Payer: Self-pay

## 2023-09-23 DIAGNOSIS — Z0000 Encounter for general adult medical examination without abnormal findings: Secondary | ICD-10-CM | POA: Diagnosis not present

## 2023-09-23 NOTE — Progress Notes (Signed)
 Subjective:   Robert Walters is a 68 y.o. male who presents for Medicare Annual/Subsequent preventive examination.  Visit Complete: Virtual I connected with  [name] on 09/23/23 by a audio enabled telemedicine application and verified that I am speaking with the correct person using two identifiers.  Patient Location: Home  Provider Location: Office/Clinic  I discussed the limitations of evaluation and management by telemedicine. The patient expressed understanding and agreed to proceed.  Vital Signs: Because this visit was a virtual/telehealth visit, some criteria may be missing or patient reported. Any vitals not documented were not able to be obtained and vitals that have been documented are patient reported.  Patient Medicare AWV questionnaire was completed by the patient on 09/23/2023; I have confirmed that all information answered by patient is correct and no changes since this date.        Objective:     There were no vitals filed for this visit. There is no height or weight on file to calculate BMI.     05/25/2022   12:55 PM 04/22/2022    2:34 PM 12/16/2021    3:05 PM 04/02/2021    9:02 AM 02/04/2021   12:18 PM 02/03/2021    7:12 AM 01/30/2021   10:16 AM  Advanced Directives  Does Patient Have a Medical Advance Directive? No No No No No No No  Would patient like information on creating a medical advance directive? No - Patient declined  No - Patient declined  No - Patient declined  No - Patient declined    Current Medications (verified) Outpatient Encounter Medications as of 09/23/2023  Medication Sig   aspirin  EC 81 MG tablet Take 1 tablet (81 mg total) by mouth daily.   atorvastatin  (LIPITOR ) 80 MG tablet Take 1 tablet (80 mg total) by mouth daily.   empagliflozin  (JARDIANCE ) 25 MG TABS tablet Take 1 tablet (25 mg total) by mouth daily before breakfast.   ezetimibe  (ZETIA ) 10 MG tablet TAKE ONE TABLET BY MOUTH ONCE DAILY.   FLUoxetine  (PROZAC ) 40 MG capsule  Take 1 capsule (40 mg total) by mouth every morning.   glucose blood (ONETOUCH ULTRA) test strip USE 1 STRIP TO CHECK GLUCOSE 3 TIMES DAILY   insulin  degludec (TRESIBA  FLEXTOUCH) 200 UNIT/ML FlexTouch Pen Take 55 units SQ daily - Patient Assistance   Insulin  Pen Needle (PEN NEEDLES) 32G X 4 MM MISC Use as directed   lamoTRIgine  (LAMICTAL ) 150 MG tablet Take 1 tablet (150 mg total) by mouth 2 (two) times daily.   LORazepam  (ATIVAN ) 1 MG tablet TAKE 1 TABLET BY MOUTH EVERY 8 HOURS AS NEEDED FOR ANXIETY   metFORMIN  (GLUCOPHAGE ) 1000 MG tablet TAKE 1 TABLET BY MOUTH TWICE DAILY WITH MEALS   metoprolol  tartrate (LOPRESSOR ) 25 MG tablet TAKE 1 TABLET BY MOUTH 2 TIMES DAILY.   Multiple Vitamin (MULTIVITAMIN WITH MINERALS) TABS tablet Take 1 tablet by mouth daily.   omega-3 acid ethyl esters (LOVAZA ) 1 g capsule Take 2 capsules (2 g total) by mouth 2 (two) times daily.   omeprazole  (PRILOSEC) 40 MG capsule Take 1 capsule (40 mg total) by mouth daily.   Semaglutide  (RYBELSUS ) 14 MG TABS Take 1 tablet (14 mg total) by mouth daily at 6 (six) AM.   trospium  (SANCTURA ) 20 MG tablet Take 1 tablet (20 mg total) by mouth 2 (two) times daily.   valsartan  (DIOVAN ) 320 MG tablet Take 1 tablet (320 mg total) by mouth daily.   No facility-administered encounter medications on file  as of 09/23/2023.    Allergies (verified) Patient has no known allergies.   History: Past Medical History:  Diagnosis Date   Acid reflux disease    Bipolar 1 disorder, mixed (HCC)    BPH (benign prostatic hypertrophy)    CVA (cerebral vascular accident) (HCC) 07/2015   Gastritis    H/O hiatal hernia    Hyperlipidemia    Hypertension    Hypertensive heart disease with chronic diastolic congestive heart failure (HCC) 01/19/2023   Normal nuclear stress test Dec 2011   Obesity    TIA (transient ischemic attack) 05/15/2012   "they think I've had one this am @ 0130" (05/15/2012)   Type II diabetes mellitus (HCC)    Vertigo    Past  Surgical History:  Procedure Laterality Date   BILIARY STENT PLACEMENT N/A 09/17/2016   Procedure: BILIARY STENT PLACEMENT;  Surgeon: Ruby Corporal, MD;  [facility];  Service: Endoscopy;  Laterality: N/A;   CARDIOVASCULAR STRESS TEST  2009   EF 63%   CHOLECYSTECTOMY N/A 08/25/2016   Procedure: LAPAROSCOPIC CHOLECYSTECTOMY;  Surgeon: Alanda Allegra, MD;  Location: [facility];  Service: General;  Laterality: N/A;   COLONOSCOPY     COLONOSCOPY WITH PROPOFOL  N/A 02/03/2021   Procedure: COLONOSCOPY WITH PROPOFOL ;  Surgeon: Urban Garden, MD;  [facility]: [facility];  Service: Gastroenterology;  Laterality: N/A;    incomplete colonoscopy poor prep   COLONOSCOPY WITH PROPOFOL  N/A 02/04/2021   Procedure: COLONOSCOPY WITH PROPOFOL ;  Surgeon: Urban Garden, MD;  [facility];  Service: Gastroenterology;  Laterality: N/A;  1:45   ERCP N/A 09/17/2016   Procedure: ENDOSCOPIC RETROGRADE CHOLANGIOPANCREATOGRAPHY (ERCP);  Surgeon: Ruby Corporal, MD;  [facility];  Service: Endoscopy;  Laterality: N/A;  730   ERCP N/A 12/03/2016   Procedure: ENDOSCOPIC RETROGRADE CHOLANGIOPANCREATOGRAPHY (ERCP);  Surgeon: Ruby Corporal, MD;  [facility];  Service: Gastroenterology;  Laterality: N/A;   ESOPHAGEAL DILATION  02/03/2021   Procedure: ESOPHAGEAL DILATION;  Surgeon: Umberto Ganong, Bearl Limes, MD;  [facility];  Service: Gastroenterology;;  savory 16 and 18 fr   ESOPHAGOGASTRODUODENOSCOPY     ESOPHAGOGASTRODUODENOSCOPY (EGD) WITH PROPOFOL  N/A 02/03/2021   Procedure: ESOPHAGOGASTRODUODENOSCOPY (EGD) WITH PROPOFOL ;  Surgeon: Urban Garden, MD;  [facility];  Service: Gastroenterology;  Laterality: N/A;   GASTROINTESTINAL STENT REMOVAL N/A 12/03/2016   Procedure: BILIARY STENT REMOVAL;  Surgeon: Ruby Corporal, MD;  [facility];  Service: Gastroenterology;  Laterality: N/A;   NO PAST SURGERIES      POLYPECTOMY  02/04/2021   Procedure: POLYPECTOMY INTESTINAL;  Surgeon: Urban Garden, MD;  [facility];  Service: Gastroenterology;;   SPHINCTEROTOMY N/A 09/17/2016   Procedure: [address];  Surgeon: Ruby Corporal, MD;  [facility];  Service: Endoscopy;  Laterality: N/A;   Family History  Problem Relation Age of Onset   Hyperlipidemia Father        in a nursing home   Colon cancer Father    Stroke Mother 54   Social History   Socioeconomic History   Marital status: Widowed    Spouse name: Not on file   Number of children: Not on file   Years of education: Not on file   Highest education level: Not on file  Occupational History   Occupation: disabled  Tobacco Use   Smoking status: Never   Smokeless tobacco: Never  Vaping Use   Vaping status: Never  Used  Substance and Sexual Activity   Alcohol use: No   Drug use: No   Sexual activity: Not Currently  Other Topics Concern   Not on file  Social History Narrative   Not on file   Social Drivers of Health   Financial Resource Strain: Low Risk  (05/25/2022)   Overall Financial Resource Strain (CARDIA)    Difficulty of Paying Living Expenses: Not hard at all  Food Insecurity: No Food Insecurity (05/25/2022)   Hunger Vital Sign    Worried About Running Out of Food in the Last Year: Never true    Ran Out of Food in the Last Year: Never true  Transportation Needs: No Transportation Needs (05/25/2022)   PRAPARE - Administrator, Civil Service (Medical): No    Lack of Transportation (Non-Medical): No  Physical Activity: Inactive (05/25/2022)   Exercise Vital Sign    Days of Exercise per Week: 0 days    Minutes of Exercise per Session: 0 min  Stress: No Stress Concern Present (05/25/2022)   [name] of Occupational Health - Occupational Stress Questionnaire    Feeling of Stress : Not at all  Social Connections: Socially Isolated (05/25/2022)   Social Connection and  Isolation Panel [NHANES]    Frequency of Communication with Friends and Family: More than three times a week    Frequency of Social Gatherings with Friends and Family: Once a week    Attends Religious Services: Never    Database administrator or Organizations: No    Attends Banker Meetings: Never    Marital Status: Widowed    Tobacco Counseling Counseling given: Not Answered   Clinical Intake:                        Activities of Daily Living     No data to display          Patient Care Team: Susanna Epley, FNP as PCP - General (General Practice) Mallipeddi, Kennyth Pean, MD as PCP - Cardiology (Cardiology) Daralyn Earl, RN as [facility] Care Management Geronimo Krabbe, [facility] as Pharmacist (Pharmacist)  Indicate any recent Medical Services you may have received from other than [facility] providers in the past year (date may be approximate).     Assessment:    This is a routine wellness examination for Ryaan.  Hearing/Vision screen No results found.   Goals Addressed   None    Depression Screen    07/05/2023   12:11 PM 01/11/2023   11:54 AM 09/09/2022    4:36 PM 09/09/2022    4:01 PM 05/25/2022    1:06 PM 05/04/2022    2:31 PM 04/22/2022    2:35 PM  PHQ 2/9 Scores  PHQ - 2 Score 0 0 0 0 0 0 0  PHQ- 9 Score 0 0 1   0     Fall Risk    07/05/2023   12:11 PM 01/11/2023   11:51 AM 09/09/2022    4:01 PM 05/25/2022   12:50 PM 05/04/2022    2:31 PM  Fall Risk   Falls in the past year? 0 0 0 1 0  Number falls in past yr: 0 0 0 0 0  Injury with Fall? 0 0 0 0 0  Risk for fall due to : No Fall Risks No Fall Risks No Fall Risks  No Fall Risks  Follow up Falls evaluation completed Falls evaluation completed Falls evaluation  completed  Falls evaluation completed    MEDICARE RISK AT HOME:    TIMED UP AND GO:  Was the test performed?  No    Cognitive Function:        04/22/2022    2:36 PM 04/02/2021    9:05 AM 03/26/2020   10:55 AM  03/21/2019   10:12 AM  6CIT Screen  What Year? 0 points 0 points 0 points 0 points  What month? 0 points 0 points 0 points 0 points  What time? 0 points 0 points 0 points 0 points  Count back from 20 0 points 0 points 0 points 0 points  Months in reverse 0 points 0 points 0 points 0 points  Repeat phrase 2 points 4 points 2 points 8 points  Total Score 2 points 4 points 2 points 8 points    Immunizations Immunization History  Administered Date(s) Administered   DTaP 10/05/2011   Fluad Trivalent(High Dose 65+) 01/11/2023   Influenza, High Dose Seasonal PF 01/15/2019, 12/31/2020   Influenza-Unspecified 03/16/2018, 03/21/2020, 03/15/2022   MODERNA COVID-19 SARS-COV-2 PEDS BIVALENT BOOSTER 24yr-24yr 04/01/2022   Moderna Covid-19 Vaccine Bivalent Booster 40yrs & up 05/22/2021   Moderna Sars-Covid-2 Vaccination 12/07/2019, 01/04/2020, 07/18/2020, 12/05/2020   PFIZER Comirnaty (Gray Top)Covid-19 Tri-Sucrose Vaccine 01/11/2023   Pneumococcal Conjugate-13 03/21/2019   Pneumococcal Polysaccharide-23 05/16/2012, 03/21/2020   Tdap 10/01/2020   Zoster Recombinant(Shingrix) 03/21/2019, 11/19/2019    TDAP status: Up to date  Flu Vaccine status: Up to date  Pneumococcal vaccine status: Up to date  Covid-19 vaccine status: Information provided on how to obtain vaccines.   Qualifies for Shingles Vaccine? Yes   Zostavax completed Yes   Shingrix Completed?: Yes  Screening Tests Health Maintenance  Topic Date Due   COVID-19 Vaccine (8 - 2024-25 season) 03/08/2023   Medicare Annual Wellness (AWV)  04/23/2023   FOOT EXAM  09/09/2023   INFLUENZA VACCINE  12/02/2023   HEMOGLOBIN A1C  01/05/2024   OPHTHALMOLOGY EXAM  02/07/2024   Diabetic kidney evaluation - eGFR measurement  07/04/2024   Diabetic kidney evaluation - Urine ACR  07/04/2024   DTaP/Tdap/Td (3 - Td or Tdap) 10/02/2030   Colonoscopy  02/05/2031   Pneumonia Vaccine 77+ Years old  Completed   Hepatitis C Screening  Completed    Zoster Vaccines- Shingrix  Completed   HPV VACCINES  Aged Out   Meningococcal B Vaccine  Aged Out    Health Maintenance  Health Maintenance Due  Topic Date Due   COVID-19 Vaccine (8 - 2024-25 season) 03/08/2023   Medicare Annual Wellness (AWV)  04/23/2023   FOOT EXAM  09/09/2023    Colorectal cancer screening: Type of screening: Colonoscopy. Completed 02/04/2021. Repeat every 10 years  Lung Cancer Screening: (Low Dose CT Chest recommended if Age 17-80 years, 20 pack-year currently smoking OR have quit w/in 15years.) does not qualify.   Lung Cancer Screening Referral: n/a  Additional Screening:  Hepatitis C Screening: does qualify; Completed 03/24/2018  Vision Screening: Recommended annual ophthalmology exams for early detection of glaucoma and other disorders of the eye. Is the patient up to date with their annual eye exam?  Yes  Who is the provider or what is the name of the office in which the patient attends annual eye exams? [facility]. If pt is not established with a provider, would they like to be referred to a provider to establish care? No .   Dental Screening: Recommended annual dental exams for proper oral hygiene  Diabetic Foot Exam:  Diabetic Foot Exam: Completed 09/09/2022  Community Resource Referral / Chronic Care Management: CRR required this visit?  No   CCM required this visit?  No     Plan:     I have personally reviewed and noted the following in the patient's chart:   Medical and social history Use of alcohol, tobacco or illicit drugs  Current medications and supplements including opioid prescriptions. Patient is not currently taking opioid prescriptions. Functional ability and status Nutritional status Physical activity Advanced directives List of other physicians Hospitalizations, surgeries, and ER visits in previous 12 months Vitals Screenings to include cognitive, depression, and falls Referrals and appointments  In addition, I have  reviewed and discussed with patient certain preventive protocols, quality metrics, and best practice recommendations. A written personalized care plan for preventive services as well as general preventive health recommendations were provided to patient.     Alexis Anger, CMA   09/23/2023   After Visit Summary: (Declined) Due to this being a telephonic visit, with patients personalized plan was offered to patient but patient Declined AVS at this time   Nurse Notes:

## 2023-09-28 NOTE — Telephone Encounter (Signed)
 Reached out to [name] and the LIS letter that was sent was not an official document for Denial.  Spoke to Robert Walters and told him to be on the look out for another letter of official proof of LIS denial from dept. Of SS.

## 2023-10-02 DIAGNOSIS — E119 Type 2 diabetes mellitus without complications: Secondary | ICD-10-CM | POA: Diagnosis not present

## 2023-10-07 ENCOUNTER — Telehealth: Payer: Self-pay

## 2023-10-07 NOTE — Telephone Encounter (Signed)
 Submitted LIS denial letter to [name] for patient assistance for Kerendia 

## 2023-10-10 DIAGNOSIS — E1140 Type 2 diabetes mellitus with diabetic neuropathy, unspecified: Secondary | ICD-10-CM | POA: Diagnosis not present

## 2023-10-10 DIAGNOSIS — M79671 Pain in right foot: Secondary | ICD-10-CM | POA: Diagnosis not present

## 2023-10-10 DIAGNOSIS — M79672 Pain in left foot: Secondary | ICD-10-CM | POA: Diagnosis not present

## 2023-10-10 DIAGNOSIS — I739 Peripheral vascular disease, unspecified: Secondary | ICD-10-CM | POA: Diagnosis not present

## 2023-10-11 ENCOUNTER — Telehealth: Payer: Self-pay

## 2023-10-11 NOTE — Telephone Encounter (Signed)
 Called patient to inform their  pen needles and rybelsus  patient assistance  is ready for pick up, patient aware. Patient made aware they have 1 week to pick up patient assistance.

## 2023-10-14 NOTE — Telephone Encounter (Signed)
 Reached out to [name] on Status of Kerendia  to see if the LIS letter was received on June 6.  It was not ,so I refaxed it 10/14/2023 with case # [id number] to [employer] for review.

## 2023-10-18 NOTE — Telephone Encounter (Signed)
 PAP: Patient assistance application for Kerendia  has been approved by PAP Companies: [employer] from 10/14/2023 to 05/02/2024. Medication should be delivered to PAP Delivery: Home. For further shipping updates, please contact [employer] [phone]. Patient ID is: not provided  Medication order was processed 10/17/2023 and will ship via ups to Patient and will be received on 10/20/2023

## 2023-11-01 DIAGNOSIS — E119 Type 2 diabetes mellitus without complications: Secondary | ICD-10-CM | POA: Diagnosis not present

## 2023-11-15 ENCOUNTER — Encounter: Payer: Self-pay | Admitting: Physician Assistant

## 2023-11-15 ENCOUNTER — Ambulatory Visit: Admitting: Physician Assistant

## 2023-11-15 DIAGNOSIS — F411 Generalized anxiety disorder: Secondary | ICD-10-CM

## 2023-11-15 DIAGNOSIS — F3341 Major depressive disorder, recurrent, in partial remission: Secondary | ICD-10-CM | POA: Diagnosis not present

## 2023-11-15 MED ORDER — FLUOXETINE HCL 40 MG PO CAPS: 40.0000 mg | ORAL_CAPSULE | Freq: Every morning | ORAL | 1 refills | Status: DC

## 2023-11-15 MED ORDER — LORAZEPAM 1 MG PO TABS: 1.0000 mg | ORAL_TABLET | Freq: Three times a day (TID) | ORAL | 1 refills | Status: DC | PRN

## 2023-11-15 MED ORDER — LAMOTRIGINE 150 MG PO TABS: 150.0000 mg | ORAL_TABLET | Freq: Two times a day (BID) | ORAL | 1 refills | Status: DC

## 2023-11-15 NOTE — Progress Notes (Signed)
 [facility] Med Check  Patient ID: Robert Walters,  MRN: [id number]  PCP: Georgina Speaks, FNP  Date of Evaluation: 11/15/2023 Time spent:25 minutes  Chief Complaint:  Chief Complaint   Anxiety; Depression; Follow-up   Virtual Visit via Telehealth  I connected with patient by telephone, with their informed consent, and verified patient privacy and that I am speaking with the correct person using two identifiers.  I am private, in my office and the patient is at home.  I discussed the limitations, risks, security and privacy concerns of performing an evaluation and management service by telephone and the availability of in person appointments. I also discussed with the patient that there may be a patient responsible charge related to this service. The patient expressed understanding and agreed to proceed.   I discussed the assessment and treatment plan with the patient. The patient was provided an opportunity to ask questions and all were answered. The patient agreed with the plan and demonstrated an understanding of the instructions.   The patient was advised to call back or seek an in-person evaluation if the symptoms worsen or if the condition fails to improve as anticipated.  I provided approximately 25 minutes of non-face-to-face time during this encounter.  HISTORY/CURRENT STATUS: HPI  For routine med check.  Feels like meds are working pretty well. Doesn't do a lot but ride to town and poke around or watches tv.   Energy and motivation are fair depending on the day.   No extreme sadness, tearfulness, or feelings of hopelessness.  Sleeps well most of the time. ADLs and personal hygiene are normal.   No change in memory.  Appetite has not changed.  Weight is stable.  No mania, psychosis, delirium, SI or HI.    Denies dizziness, syncope, seizures, numbness, tingling, tremor, tics, unsteady gait, slurred speech, confusion. Denies muscle or joint pain, stiffness, or  dystonia.  Individual Medical History/ Review of Systems: Changes? :No     Past medications for mental health diagnoses include: Ambien , Ativan , Epitol , Lamictal , Prozac , Xanax , Wellbutrin  Allergies: Patient has no known allergies.  Current Medications:  Current Outpatient Medications:    aspirin  EC 81 MG tablet, Take 1 tablet (81 mg total) by mouth daily., Disp: , Rfl:    atorvastatin  (LIPITOR ) 80 MG tablet, Take 1 tablet (80 mg total) by mouth daily., Disp: 90 tablet, Rfl: 2   empagliflozin  (JARDIANCE ) 25 MG TABS tablet, Take 1 tablet (25 mg total) by mouth daily before breakfast., Disp: 90 tablet, Rfl: 3   ezetimibe  (ZETIA ) 10 MG tablet, TAKE ONE TABLET BY MOUTH ONCE DAILY., Disp: 90 tablet, Rfl: 1   glucose blood (ONETOUCH ULTRA) test strip, USE 1 STRIP TO CHECK GLUCOSE 3 TIMES DAILY, Disp: 300 strip, Rfl: 3   insulin  degludec (TRESIBA  FLEXTOUCH) 200 UNIT/ML FlexTouch Pen, Take 55 units SQ daily - Patient Assistance, Disp: , Rfl:    Insulin  Pen Needle (PEN NEEDLES) 32G X 4 MM MISC, Use as directed, Disp: 100 each, Rfl: 2   metFORMIN  (GLUCOPHAGE ) 1000 MG tablet, TAKE 1 TABLET BY MOUTH TWICE DAILY WITH MEALS, Disp: 180 tablet, Rfl: 1   metoprolol  tartrate (LOPRESSOR ) 25 MG tablet, TAKE 1 TABLET BY MOUTH 2 TIMES DAILY., Disp: 180 tablet, Rfl: 3   Multiple Vitamin (MULTIVITAMIN WITH MINERALS) TABS tablet, Take 1 tablet by mouth daily., Disp: , Rfl:    omega-3 acid ethyl esters (LOVAZA ) 1 g capsule, Take 2 capsules (2 g total) by mouth 2 (two) times daily., Disp: 360 capsule,  Rfl: 3   omeprazole  (PRILOSEC) 40 MG capsule, Take 1 capsule (40 mg total) by mouth daily., Disp: 90 capsule, Rfl: 0   Semaglutide  (RYBELSUS ) 14 MG TABS, Take 1 tablet (14 mg total) by mouth daily at 6 (six) AM., Disp: 90 tablet, Rfl: 0   trospium  (SANCTURA ) 20 MG tablet, Take 1 tablet (20 mg total) by mouth 2 (two) times daily., Disp: 180 tablet, Rfl: 3   valsartan  (DIOVAN ) 320 MG tablet, Take 1 tablet (320 mg total)  by mouth daily., Disp: 90 tablet, Rfl: 3   FLUoxetine  (PROZAC ) 40 MG capsule, Take 1 capsule (40 mg total) by mouth every morning., Disp: 90 capsule, Rfl: 1   lamoTRIgine  (LAMICTAL ) 150 MG tablet, Take 1 tablet (150 mg total) by mouth 2 (two) times daily., Disp: 180 tablet, Rfl: 1   LORazepam  (ATIVAN ) 1 MG tablet, Take 1 tablet (1 mg total) by mouth every 8 (eight) hours as needed. for anxiety, Disp: 270 tablet, Rfl: 1   losartan  (COZAAR ) 25 MG tablet, Losartan  Potassium, Disp: , Rfl:  Medication Side Effects: none  Family Medical/ Social History: Changes?  No  MENTAL HEALTH EXAM:  There were no vitals taken for this visit.There is no height or weight on file to calculate BMI.  General Appearance: Unable to assess  Eye Contact:  Unable to assess  Speech:  Clear and Coherent, Normal Rate, and Talkative  Volume:  Normal  Mood:  Euthymic  Affect:  Unable to assess  Thought Process:  Goal Directed and Descriptions of Associations: Circumstantial  Orientation:  Full (Time, Place, and Person)  Thought Content: Logical   Suicidal Thoughts:  No  Homicidal Thoughts:  No  Memory:  WNL  Judgement:  Good  Insight:  Good  Psychomotor Activity:  Unable to assess  Concentration:  Concentration: Good and Attention Span: Good  Recall:  Good  Fund of Knowledge: Good  Language: Good  Assets:  Desire for Improvement  ADL's:  Intact  Cognition: WNL  Prognosis:  Good   DIAGNOSES:    ICD-10-CM   1. Recurrent major depressive disorder, in partial remission (HCC)  F33.41     2. Generalized anxiety disorder  F41.1       Receiving Psychotherapy: No   RECOMMENDATIONS:  PDMP was reviewed. Ativan  last filled 08/01/2023. I provided approximately  25 minutes of non-face-to-face time during this encounter, including time spent before and after the visit in records review, medical decision making, counseling pertinent to today's visit, and charting.   Daril is doing well so no changes need to be  made.  Continue Prozac  40 mg every morning. Continue Lamictal  150 mg 1 p.o. twice daily. Continue Ativan  1 mg, 1 p.o. 3 times daily as needed. Return in 6 months.  Verneita Cooks, PA-C

## 2023-11-16 ENCOUNTER — Encounter: Payer: Self-pay | Admitting: Nurse Practitioner

## 2023-11-16 ENCOUNTER — Ambulatory Visit (INDEPENDENT_AMBULATORY_CARE_PROVIDER_SITE_OTHER): Admitting: Nurse Practitioner

## 2023-11-16 VITALS — BP 110/70 | HR 89 | Temp 97.8°F | Ht 69.0 in | Wt 229.4 lb

## 2023-11-16 DIAGNOSIS — I5032 Chronic diastolic (congestive) heart failure: Secondary | ICD-10-CM

## 2023-11-16 DIAGNOSIS — I110 Hypertensive heart disease with heart failure: Secondary | ICD-10-CM

## 2023-11-16 DIAGNOSIS — H6123 Impacted cerumen, bilateral: Secondary | ICD-10-CM

## 2023-11-16 DIAGNOSIS — I130 Hypertensive heart and chronic kidney disease with heart failure and stage 1 through stage 4 chronic kidney disease, or unspecified chronic kidney disease: Secondary | ICD-10-CM

## 2023-11-16 DIAGNOSIS — E1122 Type 2 diabetes mellitus with diabetic chronic kidney disease: Secondary | ICD-10-CM | POA: Diagnosis not present

## 2023-11-16 DIAGNOSIS — E66811 Obesity, class 1: Secondary | ICD-10-CM

## 2023-11-16 DIAGNOSIS — F3170 Bipolar disorder, currently in remission, most recent episode unspecified: Secondary | ICD-10-CM

## 2023-11-16 DIAGNOSIS — N1831 Chronic kidney disease, stage 3a: Secondary | ICD-10-CM | POA: Diagnosis not present

## 2023-11-16 DIAGNOSIS — E782 Mixed hyperlipidemia: Secondary | ICD-10-CM

## 2023-11-16 DIAGNOSIS — Z79899 Other long term (current) drug therapy: Secondary | ICD-10-CM | POA: Diagnosis not present

## 2023-11-16 DIAGNOSIS — Z794 Long term (current) use of insulin: Secondary | ICD-10-CM

## 2023-11-16 DIAGNOSIS — Z0000 Encounter for general adult medical examination without abnormal findings: Secondary | ICD-10-CM | POA: Diagnosis not present

## 2023-11-16 DIAGNOSIS — Z6833 Body mass index (BMI) 33.0-33.9, adult: Secondary | ICD-10-CM

## 2023-11-16 DIAGNOSIS — R21 Rash and other nonspecific skin eruption: Secondary | ICD-10-CM

## 2023-11-16 DIAGNOSIS — E6609 Other obesity due to excess calories: Secondary | ICD-10-CM

## 2023-11-16 LAB — POCT URINALYSIS DIP (CLINITEK)
Bilirubin, UA: NEGATIVE
Blood, UA: NEGATIVE
Glucose, UA: >=1000 mg/dL — AB
Ketones, POC UA: NEGATIVE mg/dL
Leukocytes, UA: NEGATIVE
Nitrite, UA: NEGATIVE
POC PROTEIN,UA: NEGATIVE
Spec Grav, UA: 1.015 (ref 1.010–1.025)
Urobilinogen, UA: 0.2 U/dL
pH, UA: 5.5 (ref 5.0–8.0)

## 2023-11-16 MED ORDER — HYDROCORTISONE 1 % EX CREA: TOPICAL_CREAM | CUTANEOUS | Status: AC

## 2023-11-16 MED ORDER — METFORMIN HCL 1000 MG PO TABS: ORAL_TABLET | ORAL | 1 refills | Status: DC

## 2023-11-16 NOTE — Progress Notes (Signed)
 LILLETTE Kristeen JINNY Gladis, CMA,acting as a Neurosurgeon for [name], FNP.,have documented all relevant documentation on the behalf of [name], FNP,as directed by  [name], FNP while in the presence of [name], FNP.  Subjective:   Patient ID: Robert Walters , male    DOB: [birth year] , 68 y.o.   MRN: [id number]  Chief Complaint  Patient presents with   Annual Exam    Patient presents today for HM, Patient reports compliance with medication. Patient denies any chest pain, SOB, or headaches. Patient has no concerns today.     HPI Discussed the use of AI scribe software for clinical note transcription with the patient, who gave verbal consent to proceed.  History of Present Illness Nayshawn Mesta is a 68 year old male with diabetes and hypertension who presents for an annual physical exam.  He started Kerendia  approximately three to four weeks ago. He is currently taking Rybelsus  and Jardiance , Tresiba  at 55 units, and metformin . He takes Rybelsus  30 minutes before eating and checks his blood sugar daily, with a reading of 152 this morning. He uses a new glucose monitoring machine from [employer]. He takes metformin  once daily.  He describes a rash on his arms that appeared in the last couple of days. The rash does not itch or burn, and he has been applying regular lotion. He denies recent outdoor activities like gardening that could have caused the rash, although he did trim some bushes recently. He has not used hydrocortisone  cream on the rash.  He has a history of stomach issues and previously consulted a gastroenterologist, who told him that surgery was not recommended and that losing weight might help. No current constipation or diarrhea, but he sometimes goes two days without a bowel movement.  He discusses his foot care routine, including regular podiatrist visits every three months for toenail clipping and foot checks, covered by Medicare. He walks barefoot in the house and does  not wear socks when going out.  He recalls a family history of blood cell issues, mentioning his father's experience with low blood cell counts and subsequent surgery. He monitors his own blood cell counts due to this family history.   Here for HM.      Past Medical History:  Diagnosis Date   Acid reflux disease    Bipolar 1 disorder, mixed (HCC)    BPH (benign prostatic hypertrophy)    CVA (cerebral vascular accident) (HCC) 07/2015   Gastritis    H/O hiatal hernia    Hyperlipidemia    Hypertension    Hypertensive heart disease with chronic diastolic congestive heart failure (HCC) 01/19/2023   Normal nuclear stress test Dec 2011   Obesity    TIA (transient ischemic attack) 05/15/2012   they think I've had one this am @ 0130 (05/15/2012)   Type II diabetes mellitus (HCC)    Vertigo      Family History  Problem Relation Age of Onset   Hyperlipidemia Father        in a nursing home   Colon cancer Father    Stroke Mother 85     Current Outpatient Medications:    aspirin  EC 81 MG tablet, Take 1 tablet (81 mg total) by mouth daily., Disp: , Rfl:    atorvastatin  (LIPITOR ) 80 MG tablet, Take 1 tablet (80 mg total) by mouth daily., Disp: 90 tablet, Rfl: 2   empagliflozin  (JARDIANCE ) 25 MG TABS tablet, Take 1 tablet (25 mg total) by mouth  daily before breakfast., Disp: 90 tablet, Rfl: 3   ezetimibe  (ZETIA ) 10 MG tablet, TAKE ONE TABLET BY MOUTH ONCE DAILY., Disp: 90 tablet, Rfl: 1   FLUoxetine  (PROZAC ) 40 MG capsule, Take 1 capsule (40 mg total) by mouth every morning., Disp: 90 capsule, Rfl: 1   glucose blood (ONETOUCH ULTRA) test strip, USE 1 STRIP TO CHECK GLUCOSE 3 TIMES DAILY, Disp: 300 strip, Rfl: 3   hydrocortisone  cream 1 %, Apply to affected area 2 times daily, Disp: , Rfl:    insulin  degludec (TRESIBA  FLEXTOUCH) 200 UNIT/ML FlexTouch Pen, Take 55 units SQ daily - Patient Assistance, Disp: , Rfl:    Insulin  Pen Needle (PEN NEEDLES) 32G X 4 MM MISC, Use as directed, Disp:  100 each, Rfl: 2   lamoTRIgine  (LAMICTAL ) 150 MG tablet, Take 1 tablet (150 mg total) by mouth 2 (two) times daily., Disp: 180 tablet, Rfl: 1   LORazepam  (ATIVAN ) 1 MG tablet, Take 1 tablet (1 mg total) by mouth every 8 (eight) hours as needed. for anxiety, Disp: 270 tablet, Rfl: 1   losartan  (COZAAR ) 25 MG tablet, Losartan  Potassium, Disp: , Rfl:    metoprolol  tartrate (LOPRESSOR ) 25 MG tablet, TAKE 1 TABLET BY MOUTH 2 TIMES DAILY., Disp: 180 tablet, Rfl: 3   Multiple Vitamin (MULTIVITAMIN WITH MINERALS) TABS tablet, Take 1 tablet by mouth daily., Disp: , Rfl:    omega-3 acid ethyl esters (LOVAZA ) 1 g capsule, Take 2 capsules (2 g total) by mouth 2 (two) times daily., Disp: 360 capsule, Rfl: 3   omeprazole  (PRILOSEC) 40 MG capsule, Take 1 capsule (40 mg total) by mouth daily., Disp: 90 capsule, Rfl: 0   Semaglutide  (RYBELSUS ) 14 MG TABS, Take 1 tablet (14 mg total) by mouth daily at 6 (six) AM., Disp: 90 tablet, Rfl: 0   trospium  (SANCTURA ) 20 MG tablet, Take 1 tablet (20 mg total) by mouth 2 (two) times daily., Disp: 180 tablet, Rfl: 3   valsartan  (DIOVAN ) 320 MG tablet, Take 1 tablet (320 mg total) by mouth daily., Disp: 90 tablet, Rfl: 3   metFORMIN  (GLUCOPHAGE ) 1000 MG tablet, TAKE 1 TABLET BY MOUTH TWICE DAILY WITH MEALS, Disp: 180 tablet, Rfl: 1   No Known Allergies   Men's preventive visit. Patient Health Questionnaire (PHQ-2) is  Flowsheet Row Office Visit from 11/16/2023 in [facility]  PHQ-2 Total Score 1  Patient is on a regular diet; he eats a lot greens and pinto beans. No exercise on a regular basis. Marital status: Widowed. Relevant history for alcohol use is:  Social History   Substance and Sexual Activity  Alcohol Use No  Relevant history for tobacco use is:  Social History   Tobacco Use  Smoking Status Never  Smokeless Tobacco Never  .   Review of Systems  Constitutional: Negative.   HENT: Negative.    Eyes: Negative.    Respiratory: Negative.    Cardiovascular: Negative.   Gastrointestinal: Negative.   Genitourinary: Negative.   Musculoskeletal: Negative.   Skin: Negative.   Allergic/Immunologic: Negative.   Neurological: Negative.   Hematological: Negative.   Psychiatric/Behavioral: Negative.       Today's Vitals   11/16/23 1056  BP: 110/70  Pulse: 89  Temp: 97.8 F (36.6 C)  TempSrc: Oral  Weight: 229 lb 6.4 oz (104.1 kg)  Height: 5' 9 (1.753 m)  PainSc: 0-No pain   Body mass index is 33.88 kg/m.  Wt Readings from Last 3 Encounters:  11/16/23 229 lb 6.4  oz (104.1 kg)  07/05/23 228 lb (103.4 kg)  02/18/23 224 lb (101.6 kg)    Objective:  Physical Exam Vitals and nursing note reviewed.  Constitutional:      General: He is not in acute distress.    Appearance: Normal appearance. He is obese.  HENT:     Head: Normocephalic and atraumatic.     Right Ear: Tympanic membrane, ear canal and external ear normal. There is no impacted cerumen.     Left Ear: Tympanic membrane, ear canal and external ear normal. There is no impacted cerumen.     Nose: Nose normal.     Mouth/Throat:     Mouth: Mucous membranes are moist.  Eyes:     Extraocular Movements: Extraocular movements intact.     Pupils: Pupils are equal, round, and reactive to light.  Cardiovascular:     Rate and Rhythm: Normal rate and regular rhythm.     Pulses: Normal pulses.     Heart sounds: Normal heart sounds. No murmur heard. Pulmonary:     Effort: Pulmonary effort is normal. No respiratory distress.     Breath sounds: Normal breath sounds. No wheezing.  Abdominal:     General: Abdomen is flat. Bowel sounds are normal. There is no distension.     Palpations: Abdomen is soft.  Genitourinary:    Prostate: Normal.     Rectum: Guaiac result negative.  Musculoskeletal:        General: No swelling or tenderness. Normal range of motion.     Cervical back: Normal range of motion and neck supple. No rigidity.  Skin:     General: Skin is warm.     Capillary Refill: Capillary refill takes less than 2 seconds.  Neurological:     General: No focal deficit present.     Mental Status: He is alert and oriented to person, place, and time.     Cranial Nerves: No cranial nerve deficit.     Motor: No weakness.  Psychiatric:        Mood and Affect: Mood normal.        Behavior: Behavior normal.        Thought Content: Thought content normal.        Judgment: Judgment normal.      Diabetic foot exam was performed with the following findings:   No deformities, ulcerations, or other skin breakdown Normal sensation of 10g monofilament Intact posterior tibialis and dorsalis pedis pulses     Assessment And Plan:    Encounter for annual health examination Assessment & Plan: No regular exercise. Diet includes green beans and bento beans. Regular podiatry care every three months. Advised to wear slippers. - Encourage regular physical activity. - Advise on balanced diet considering carbohydrate intake from bento beans. - Continue regular podiatry visits. - Advise wearing slippers to prevent foot injuries.   Type 2 diabetes mellitus with stage 3a chronic kidney disease, with long-term current use of insulin  (HCC) Assessment & Plan: A1c of 7.0 indicates suboptimal control. Current medications include Rybelsus , Jardiance , Tresiba , and Metformin . Blood sugar at 152 mg/dL. Metformin  taken once daily instead of twice. Need to improve glycemic control and reduce insulin  dependency. - Encourage taking Metformin  twice daily. - Continue Rybelsus , Jardiance , and Tresiba . - Monitor blood glucose levels daily. - Discuss strategies to reduce insulin  dependency. -Started Kerendia  3-4 weeks ago. Monitoring for allergic reactions necessary. Will check potassium, would generally increase if normal however due to rash will hold off to see if any  improvement of rash with hydrocortisone  cream.  - Continue Kerendia  unless rash worsens  or does not improve with hydrocortisone  cream.  Orders: -     EKG 12-Lead -     POCT URINALYSIS DIP (CLINITEK) -     Microalbumin / creatinine urine ratio -     CMP14+EGFR -     Hemoglobin A1c -     metFORMIN  HCl; TAKE 1 TABLET BY MOUTH TWICE DAILY WITH MEALS  Dispense: 180 tablet; Refill: 1  Mixed hyperlipidemia Assessment & Plan: Cholesterol levels are stable. Continue current medications.   Orders: -     Lipid panel  Class 1 obesity due to excess calories with serious comorbidity and body mass index (BMI) of 33.0 to 33.9 in adult Assessment & Plan: He is encouraged to strive for BMI less than 30 to decrease cardiac risk. Advised to aim for at least 150 minutes of exercise per week.    Other long term (current) drug therapy -     CBC with Differential/Platelet  Rash and nonspecific skin eruption Assessment & Plan: Non-itchy, non-burning rash, flat, erythematous rash bilateral arms.  Possible contact dermatitis or allergic reaction to Kerendia . - Recommend over-the-counter hydrocortisone  cream. - If rash persists, consider discontinuing Kerendia .  Orders: -     Hydrocortisone ; Apply to affected area 2 times daily  Bilateral impacted cerumen Assessment & Plan: Wax is removed by with lavage with elephant ear with 1/2 water  and 1/2 peroxide. Instructions for home care to prevent wax buildup are given.   Orders: -     Ear Lavage  Chronic diastolic heart failure (HCC) Assessment & Plan: Continue f/u with Cardiology   Bipolar disorder in full remission, most recent episode unspecified type [facility]) Assessment & Plan: Continue f/u with Behavioral health and medications.   Hypertensive heart disease with chronic diastolic congestive heart failure (HCC) Assessment & Plan: Blood pressure is well controlled, continue current medications. EKG done with -Incomplete right bundle branch block and right axis -possible right ventricular hypertrophy.  HR 87      Return for 1  year physical, controlled DM check 4 months. Patient was given opportunity to ask questions. Patient verbalized understanding of the plan and was able to repeat key elements of the plan. All questions were answered to their satisfaction.   [name], FNP  I, [name], FNP, have reviewed all documentation for this visit. The documentation on 11/16/23 for the exam, diagnosis, procedures, and orders are all accurate and complete.

## 2023-11-17 LAB — CBC WITH DIFFERENTIAL/PLATELET
Basophils Absolute: 0 x10E3/uL (ref 0.0–0.2)
Basos: 0 %
EOS (ABSOLUTE): 0.2 x10E3/uL (ref 0.0–0.4)
Eos: 3 %
Hematocrit: 43 % (ref 37.5–51.0)
Hemoglobin: 14.4 g/dL (ref 13.0–17.7)
Immature Grans (Abs): 0 x10E3/uL (ref 0.0–0.1)
Immature Granulocytes: 0 %
Lymphocytes Absolute: 1.4 x10E3/uL (ref 0.7–3.1)
Lymphs: 18 %
MCH: 32.4 pg (ref 26.6–33.0)
MCHC: 33.5 g/dL (ref 31.5–35.7)
MCV: 97 fL (ref 79–97)
Monocytes Absolute: 0.5 x10E3/uL (ref 0.1–0.9)
Monocytes: 7 %
Neutrophils Absolute: 5.4 x10E3/uL (ref 1.4–7.0)
Neutrophils: 72 %
Platelets: 167 x10E3/uL (ref 150–450)
RBC: 4.44 x10E6/uL (ref 4.14–5.80)
RDW: 14.2 % (ref 11.6–15.4)
WBC: 7.5 x10E3/uL (ref 3.4–10.8)

## 2023-11-17 LAB — CMP14+EGFR
ALT: 51 IU/L — ABNORMAL HIGH (ref 0–44)
AST: 40 IU/L (ref 0–40)
Albumin: 4.5 g/dL (ref 3.9–4.9)
Alkaline Phosphatase: 146 IU/L — ABNORMAL HIGH (ref 44–121)
BUN/Creatinine Ratio: 15 (ref 10–24)
BUN: 27 mg/dL (ref 8–27)
Bilirubin Total: 0.7 mg/dL (ref 0.0–1.2)
CO2: 19 mmol/L — ABNORMAL LOW (ref 20–29)
Calcium: 9 mg/dL (ref 8.6–10.2)
Chloride: 101 mmol/L (ref 96–106)
Creatinine, Ser: 1.75 mg/dL — ABNORMAL HIGH (ref 0.76–1.27)
Globulin, Total: 2.3 g/dL (ref 1.5–4.5)
Glucose: 150 mg/dL — ABNORMAL HIGH (ref 70–99)
Potassium: 5.1 mmol/L (ref 3.5–5.2)
Sodium: 137 mmol/L (ref 134–144)
Total Protein: 6.8 g/dL (ref 6.0–8.5)
eGFR: 42 mL/min/1.73 — ABNORMAL LOW (ref >59)

## 2023-11-17 LAB — HEMOGLOBIN A1C
Est. average glucose Bld gHb Est-mCnc: 148 mg/dL
Hgb A1c MFr Bld: 6.8 % — ABNORMAL HIGH (ref 4.8–5.6)

## 2023-11-17 LAB — MICROALBUMIN / CREATININE URINE RATIO
Creatinine, Urine: 107.3 mg/dL
Microalb/Creat Ratio: 19 mg/g{creat} (ref 0–29)
Microalbumin, Urine: 20.2 ug/mL

## 2023-11-17 LAB — LIPID PANEL
Chol/HDL Ratio: 3.5 ratio (ref 0.0–5.0)
Cholesterol, Total: 143 mg/dL (ref 100–199)
HDL: 41 mg/dL (ref >39)
LDL Chol Calc (NIH): 52 mg/dL (ref 0–99)
Triglycerides: 324 mg/dL — ABNORMAL HIGH (ref 0–149)
VLDL Cholesterol Cal: 50 mg/dL — ABNORMAL HIGH (ref 5–40)

## 2023-11-25 ENCOUNTER — Ambulatory Visit: Payer: Self-pay

## 2023-11-25 NOTE — Telephone Encounter (Signed)
 FYI Only or Action Required?: Action required by provider: lab or test result follow-up needed.  Patient was last seen in primary care on 11/16/2023 by Georgina Speaks, FNP.  Called Nurse Triage reporting lab results.  Symptoms began na.  Interventions attempted: Other: na.  Symptoms are: na.  Triage Disposition: Call PCP Within 24 Hours  Patient/caregiver understands and will follow disposition?: No, wishes to speak with PCP  Copied from CRM #[id number]. Topic: Clinical - Lab/Test Results >> Nov 25, 2023 11:51 AM Donee H wrote: Reason for CRM: Patient calling wanting someone to go over recent lab results. Reason for Disposition  Caller requesting lab results  (Exception: Routine or non-urgent lab result.)  Answer Assessment - Initial Assessment Questions 1. REASON FOR CALL or QUESTION: What is your reason for calling today? or How can I best     Requesting lab results from 11/16/23. Chart reviewed, does not appear results have been reviewed by provider. Please review and follow up with patient re: results and any recommendations 2. CALLER: Document the source of call. (e.g., laboratory staff, caregiver or patient).     Patient  Additional Info: Best contact number [phone]  Protocols used: PCP Call - No Triage-A-AH

## 2023-11-27 ENCOUNTER — Ambulatory Visit: Payer: Self-pay | Admitting: Nurse Practitioner

## 2023-11-27 DIAGNOSIS — F3170 Bipolar disorder, currently in remission, most recent episode unspecified: Secondary | ICD-10-CM | POA: Insufficient documentation

## 2023-11-27 DIAGNOSIS — R21 Rash and other nonspecific skin eruption: Secondary | ICD-10-CM | POA: Insufficient documentation

## 2023-11-27 DIAGNOSIS — Z0000 Encounter for general adult medical examination without abnormal findings: Secondary | ICD-10-CM | POA: Insufficient documentation

## 2023-11-27 DIAGNOSIS — H6123 Impacted cerumen, bilateral: Secondary | ICD-10-CM | POA: Insufficient documentation

## 2023-11-27 NOTE — Assessment & Plan Note (Signed)
Continue f/u with Cardiology.

## 2023-11-27 NOTE — Assessment & Plan Note (Signed)
 Blood pressure is well controlled, continue current medications. EKG done with -Incomplete right bundle branch block and right axis -possible right ventricular hypertrophy.  HR 87

## 2023-11-27 NOTE — Assessment & Plan Note (Signed)
 Cholesterol levels are stable. Continue current medications

## 2023-11-27 NOTE — Assessment & Plan Note (Signed)
 Continue f/u with Behavioral health and medications.

## 2023-11-27 NOTE — Assessment & Plan Note (Signed)
 No regular exercise. Diet includes green beans and bento beans. Regular podiatry care every three months. Advised to wear slippers. - Encourage regular physical activity. - Advise on balanced diet considering carbohydrate intake from bento beans. - Continue regular podiatry visits. - Advise wearing slippers to prevent foot injuries.

## 2023-11-27 NOTE — Assessment & Plan Note (Addendum)
 A1c of 7.0 indicates suboptimal control. Current medications include Rybelsus , Jardiance , Tresiba , and Metformin . Blood sugar at 152 mg/dL. Metformin  taken once daily instead of twice. Need to improve glycemic control and reduce insulin  dependency. - Encourage taking Metformin  twice daily. - Continue Rybelsus , Jardiance , and Tresiba . - Monitor blood glucose levels daily. - Discuss strategies to reduce insulin  dependency. -Started Kerendia  3-4 weeks ago. Monitoring for allergic reactions necessary. Will check potassium, would generally increase if normal however due to rash will hold off to see if any improvement of rash with hydrocortisone  cream.  - Continue Kerendia  unless rash worsens or does not improve with hydrocortisone  cream.

## 2023-11-27 NOTE — Assessment & Plan Note (Signed)
 Non-itchy, non-burning rash, flat, erythematous rash bilateral arms.  Possible contact dermatitis or allergic reaction to Kerendia . - Recommend over-the-counter hydrocortisone  cream. - If rash persists, consider discontinuing Kerendia .

## 2023-11-27 NOTE — Assessment & Plan Note (Signed)
 Wax is removed by with lavage with elephant ear with 1/2 water and 1/2 peroxide. Instructions for home care to prevent wax buildup are given.

## 2023-11-27 NOTE — Assessment & Plan Note (Signed)
 He is encouraged to strive for BMI less than 30 to decrease cardiac risk. Advised to aim for at least 150 minutes of exercise per week.

## 2023-12-02 DIAGNOSIS — E119 Type 2 diabetes mellitus without complications: Secondary | ICD-10-CM | POA: Diagnosis not present

## 2023-12-19 DIAGNOSIS — E1140 Type 2 diabetes mellitus with diabetic neuropathy, unspecified: Secondary | ICD-10-CM | POA: Diagnosis not present

## 2023-12-19 DIAGNOSIS — M79672 Pain in left foot: Secondary | ICD-10-CM | POA: Diagnosis not present

## 2023-12-19 DIAGNOSIS — I739 Peripheral vascular disease, unspecified: Secondary | ICD-10-CM | POA: Diagnosis not present

## 2023-12-19 DIAGNOSIS — M79671 Pain in right foot: Secondary | ICD-10-CM | POA: Diagnosis not present

## 2024-01-02 DIAGNOSIS — E119 Type 2 diabetes mellitus without complications: Secondary | ICD-10-CM | POA: Diagnosis not present

## 2024-01-17 DIAGNOSIS — K080 Exfoliation of teeth due to systemic causes: Secondary | ICD-10-CM | POA: Diagnosis not present

## 2024-01-26 ENCOUNTER — Telehealth: Payer: Self-pay

## 2024-01-26 NOTE — Telephone Encounter (Signed)
 Called patient to inform their  tresiba  patient assistance  is ready for pick up, LVM. Patient made aware they have 1 week to pick up patient assistance.

## 2024-02-01 DIAGNOSIS — E119 Type 2 diabetes mellitus without complications: Secondary | ICD-10-CM | POA: Diagnosis not present

## 2024-02-01 NOTE — Telephone Encounter (Unsigned)
 Copied from CRM #[id number]. Topic: General - Other >> Feb 01, 2024 10:05 AM Joesph NOVAK wrote: Reason for CRM: patients wants to let the clinic know he will pick insulin  degludec (TRESIBA  FLEXTOUCH) 200 UNIT/ML FlexTouch Pen up Monday morning.

## 2024-02-06 ENCOUNTER — Telehealth: Payer: Self-pay

## 2024-02-06 NOTE — Telephone Encounter (Signed)
 Copied from CRM #[id number]. Topic: General - Other >> Feb 06, 2024 10:42 AM Carlatta H wrote: Reason for CRM: Patient has medication being held at the office and will be in to pick it up on Thursday

## 2024-02-13 ENCOUNTER — Other Ambulatory Visit: Payer: Self-pay | Admitting: Nurse Practitioner

## 2024-03-03 DIAGNOSIS — E119 Type 2 diabetes mellitus without complications: Secondary | ICD-10-CM | POA: Diagnosis not present

## 2024-03-07 ENCOUNTER — Telehealth: Payer: Self-pay | Admitting: Pharmacist

## 2024-03-07 ENCOUNTER — Telehealth: Payer: Self-pay

## 2024-03-07 ENCOUNTER — Other Ambulatory Visit (HOSPITAL_BASED_OUTPATIENT_CLINIC_OR_DEPARTMENT_OTHER): Payer: Self-pay | Admitting: Internal Medicine

## 2024-03-07 ENCOUNTER — Encounter (INDEPENDENT_AMBULATORY_CARE_PROVIDER_SITE_OTHER): Payer: Self-pay | Admitting: Gastroenterology

## 2024-03-07 DIAGNOSIS — E1169 Type 2 diabetes mellitus with other specified complication: Secondary | ICD-10-CM

## 2024-03-07 NOTE — Progress Notes (Unsigned)
   03/07/2024  Patient ID: Robert Walters, male   DOB: December 06, 1955, 68 y.o.   MRN: [id number] Needs renewal-Tresiba  , jardiance , kerendia  (no longer on med list investigate)--patient said still taking

## 2024-03-07 NOTE — Telephone Encounter (Unsigned)
 Copied from CRM [id number]. Topic: General - Other >> Mar 07, 2024  2:26 PM Fonda T wrote: Reason for CRM: Patient calling, requesting medication assistance on current medications he is taking. States he was informed person responsible for medication assistance process was in office on Wednesdays, and requesting to speak with her.   Patient requesting a return call to check status of application for continued medication assistance.  Patient can be reached at [phone].  Patient is aware of same day call back.  Thank you

## 2024-03-09 ENCOUNTER — Telehealth: Payer: Self-pay

## 2024-03-09 NOTE — Telephone Encounter (Signed)
 PAP: Patient assistance application for Jardiance through [employer]) has been mailed to pt's home address on file. Provider portion of application will be faxed to provider's office.

## 2024-03-09 NOTE — Telephone Encounter (Signed)
 PAP: Patient assistance application for Tresiba  through [employer] has been mailed to pt's home address on file. Provider portion of application will be faxed to provider's office. Patient portion e-filed and added Novo fine needles

## 2024-03-09 NOTE — Addendum Note (Signed)
 Addended by: JOLEE CASSIUS PARAS on: 03/09/2024 10:39 AM   Modules accepted: Orders

## 2024-03-09 NOTE — Telephone Encounter (Signed)
 PAP: Patient assistance application for [name] through [employer] has been mailed to pt's home address on file. Provider portion of application will be faxed to provider's office.

## 2024-03-12 DIAGNOSIS — M79672 Pain in left foot: Secondary | ICD-10-CM | POA: Diagnosis not present

## 2024-03-12 DIAGNOSIS — I739 Peripheral vascular disease, unspecified: Secondary | ICD-10-CM | POA: Diagnosis not present

## 2024-03-12 DIAGNOSIS — E1140 Type 2 diabetes mellitus with diabetic neuropathy, unspecified: Secondary | ICD-10-CM | POA: Diagnosis not present

## 2024-03-12 DIAGNOSIS — M79671 Pain in right foot: Secondary | ICD-10-CM | POA: Diagnosis not present

## 2024-03-14 ENCOUNTER — Telehealth: Payer: Self-pay | Admitting: Pharmacist

## 2024-03-14 ENCOUNTER — Encounter: Payer: Self-pay | Admitting: Pharmacist

## 2024-03-14 DIAGNOSIS — N1831 Chronic kidney disease, stage 3a: Secondary | ICD-10-CM

## 2024-03-14 NOTE — Progress Notes (Unsigned)
   03/14/2024  Patient ID: Robert Walters, male   DOB: 1955/09/24, 68 y.o.   MRN: [id number] Novo form Tresiba   [employer] Kerendia ,  Jardiance

## 2024-03-15 ENCOUNTER — Other Ambulatory Visit (HOSPITAL_COMMUNITY): Payer: Self-pay

## 2024-03-15 MED ORDER — KERENDIA 10 MG PO TABS: 1.0000 | ORAL_TABLET | Freq: Every day | ORAL | 3 refills | Status: AC

## 2024-03-15 NOTE — Progress Notes (Signed)
   03/15/2024 Name: Robert Walters MRN: [id number] DOB: 1956-02-07  Chief Complaint  Patient presents with   Medication Assistance    Kernendia   Received paperwork from [facility] Patient [facility] requesting a new prescription to be sent to CoverMyMeds for [place]. Signatures were also obtained on applications for Jardiance , Tresiba  and Novo Fine Needles.   Lab Results  Component Value Date   HGBA1C 6.8 (H) 11/16/2023   HGBA1C 7.0 (H) 07/05/2023   HGBA1C 6.3 (H) 01/11/2023    Robert Walters, PharmD, BCACP Clinical Pharmacist [phone]

## 2024-03-16 NOTE — Telephone Encounter (Signed)
 PAP: Application for Robert Walters has been submitted to [employer], via fax

## 2024-03-16 NOTE — Telephone Encounter (Signed)
 Received provider portion PAP application ([place]) Jardiance 

## 2024-03-16 NOTE — Telephone Encounter (Signed)
 Received provider portion PAP application [employer] (kerendia )

## 2024-03-19 ENCOUNTER — Encounter: Payer: Self-pay | Admitting: Nurse Practitioner

## 2024-03-19 ENCOUNTER — Ambulatory Visit: Payer: Self-pay | Admitting: Nurse Practitioner

## 2024-03-19 VITALS — BP 130/80 | HR 84 | Temp 98.5°F | Ht 69.0 in | Wt 227.0 lb

## 2024-03-19 DIAGNOSIS — Z794 Long term (current) use of insulin: Secondary | ICD-10-CM | POA: Diagnosis not present

## 2024-03-19 DIAGNOSIS — I110 Hypertensive heart disease with heart failure: Secondary | ICD-10-CM

## 2024-03-19 DIAGNOSIS — I10 Essential (primary) hypertension: Secondary | ICD-10-CM | POA: Diagnosis not present

## 2024-03-19 DIAGNOSIS — E66811 Obesity, class 1: Secondary | ICD-10-CM

## 2024-03-19 DIAGNOSIS — N1831 Chronic kidney disease, stage 3a: Secondary | ICD-10-CM

## 2024-03-19 DIAGNOSIS — I5032 Chronic diastolic (congestive) heart failure: Secondary | ICD-10-CM | POA: Diagnosis not present

## 2024-03-19 DIAGNOSIS — F319 Bipolar disorder, unspecified: Secondary | ICD-10-CM

## 2024-03-19 DIAGNOSIS — I130 Hypertensive heart and chronic kidney disease with heart failure and stage 1 through stage 4 chronic kidney disease, or unspecified chronic kidney disease: Secondary | ICD-10-CM

## 2024-03-19 DIAGNOSIS — E1122 Type 2 diabetes mellitus with diabetic chronic kidney disease: Secondary | ICD-10-CM

## 2024-03-19 DIAGNOSIS — Z6833 Body mass index (BMI) 33.0-33.9, adult: Secondary | ICD-10-CM

## 2024-03-19 DIAGNOSIS — E6609 Other obesity due to excess calories: Secondary | ICD-10-CM

## 2024-03-19 DIAGNOSIS — E782 Mixed hyperlipidemia: Secondary | ICD-10-CM

## 2024-03-19 LAB — BMP8+EGFR
BUN/Creatinine Ratio: 14 (ref 10–24)
BUN: 26 mg/dL (ref 8–27)
CO2: 21 mmol/L (ref 20–29)
Calcium: 10.1 mg/dL (ref 8.6–10.2)
Chloride: 100 mmol/L (ref 96–106)
Creatinine, Ser: 1.82 mg/dL — ABNORMAL HIGH (ref 0.76–1.27)
Glucose: 183 mg/dL — ABNORMAL HIGH (ref 70–99)
Potassium: 4.9 mmol/L (ref 3.5–5.2)
Sodium: 137 mmol/L (ref 134–144)
eGFR: 39 mL/min/1.73 — ABNORMAL LOW (ref >59)

## 2024-03-19 LAB — LIPID PANEL
Chol/HDL Ratio: 3.8 ratio (ref 0.0–5.0)
Cholesterol, Total: 152 mg/dL (ref 100–199)
HDL: 40 mg/dL (ref >39)
LDL Chol Calc (NIH): 61 mg/dL (ref 0–99)
Triglycerides: 326 mg/dL — ABNORMAL HIGH (ref 0–149)
VLDL Cholesterol Cal: 51 mg/dL — ABNORMAL HIGH (ref 5–40)

## 2024-03-19 LAB — HEMOGLOBIN A1C
Est. average glucose Bld gHb Est-mCnc: 154 mg/dL
Hgb A1c MFr Bld: 7 % — ABNORMAL HIGH (ref 4.8–5.6)

## 2024-03-19 NOTE — Progress Notes (Signed)
 Robert Walters, CMA,acting as a neurosurgeon for Robert [name], FNP.,have documented all relevant documentation on the behalf of Robert [name], FNP,as directed by  Robert [name], FNP while in the presence of Robert [name], FNP.  Subjective:  Patient ID: Robert Walters , male    DOB: March 06, 1956 , 68 y.o.   MRN: [id number]  Chief Complaint  Patient presents with   Hypertension    Patient presents today for a bp and dm follow up, Patient reports compliance with medication. Patient denies any chest pain, SOB, or headaches. Patient has no concerns today.     Diabetes He presents for his follow-up diabetic visit. He has type 2 diabetes mellitus. His disease course has been stable. There are no hypoglycemic associated symptoms. Pertinent negatives for hypoglycemia include no dizziness or headaches. There are no diabetic associated symptoms. Pertinent negatives for diabetes include no polydipsia, no polyphagia and no polyuria. There are no hypoglycemic complications. Symptoms are stable. Diabetic complications include heart disease and nephropathy. Risk factors for coronary artery disease include diabetes mellitus, sedentary lifestyle, male sex and obesity. Current diabetic treatment includes oral agent (triple therapy) (Continues with tresiba , jardiance  and rybelsus ). He is compliant with treatment all of the time. His weight is stable. He is following a generally unhealthy diet. He has not had a previous visit with a dietitian. He rarely participates in exercise. (Blood sugars ranging 130-220 - continues to take insulin  ) An ACE inhibitor/angiotensin II receptor blocker is being taken. He does not see a podiatrist.Eye exam is not current (he is due to see the opthalmologist).  Hypertension This is a chronic problem. The current episode started more than 1 year ago. The problem is unchanged. The problem is controlled. Pertinent negatives include no anxiety or headaches. There are no associated agents to hypertension.  Risk factors for coronary artery disease include obesity and sedentary lifestyle. There are no compliance problems.  There is no history of angina. There is no history of chronic renal disease.    Discussed the use of AI scribe software for clinical note transcription with the patient, who gave verbal consent to proceed.  History of Present Illness Robert Walters is a 68 year old male with diabetes who presents for a follow-up appointment.  He experiences fluctuating blood sugar levels, ranging from 106 mg/dL to 779 mg/dL. He feels optimal when his blood sugar is around 140 mg/dL but experiences discomfort when it drops to 115-120 mg/dL. He manages his diabetes with insulin , administering 56 units by injection into his abdomen, and is also taking Rybelsus  and Jardiance .  He is concerned about his kidney function, mentioning a previous stage 3A classification. He has a family history of kidney issues, as his uncle required dialysis.  He received his flu shot two weeks ago and had his shingles vaccine two to three years ago. He is unsure about the frequency of the shingles vaccine but believes he is up to date. He also mentions having received a teniposide shot, although he is unsure about insurance coverage for it.  He has not visited the eye doctor since October 7th of last year and acknowledges that he is overdue for an appointment.  His hemoglobin level was 14.4 g/dL at his last check, and he is curious about his red and white blood cell counts.  He engages in some physical activity, primarily walking, but acknowledges the need to increase his exercise.  Past Medical History:  Diagnosis Date   Acid reflux disease  Bipolar 1 disorder, mixed (HCC)    BPH (benign prostatic hypertrophy)    CVA (cerebral vascular accident) (HCC) 07/2015   Gastritis    H/O hiatal hernia    Hyperlipidemia    Hypertension    Hypertensive heart disease with chronic diastolic congestive heart failure  (HCC) 01/19/2023   Normal nuclear stress test Dec 2011   Obesity    TIA (transient ischemic attack) 05/15/2012   they think I've had one this am @ 0130 (05/15/2012)   Type II diabetes mellitus (HCC)    Vertigo      Family History  Problem Relation Age of Onset   Hyperlipidemia Father        in a nursing home   Colon cancer Father    Stroke Mother 75     Current Outpatient Medications:    aspirin  EC 81 MG tablet, Take 1 tablet (81 mg total) by mouth daily., Disp: , Rfl:    atorvastatin  (LIPITOR ) 80 MG tablet, Take 1 tablet (80 mg total) by mouth daily., Disp: 90 tablet, Rfl: 2   ezetimibe  (ZETIA ) 10 MG tablet, TAKE ONE TABLET BY MOUTH ONCE DAILY., Disp: 30 tablet, Rfl: 0   Finerenone  (KERENDIA ) 10 MG TABS, Take 1 tablet (10 mg total) by mouth daily. Gets through Patient assistance (BAYER), Disp: 90 tablet, Rfl: 3   FLUoxetine  (PROZAC ) 40 MG capsule, Take 1 capsule (40 mg total) by mouth every morning., Disp: 90 capsule, Rfl: 1   glucose blood (ONETOUCH ULTRA) test strip, USE 1 STRIP TO CHECK GLUCOSE 3 TIMES DAILY, Disp: 300 strip, Rfl: 3   hydrocortisone  cream 1 %, Apply to affected area 2 times daily, Disp: , Rfl:    insulin  degludec (TRESIBA  FLEXTOUCH) 200 UNIT/ML FlexTouch Pen, Take 55 units SQ daily - Patient Assistance, Disp: , Rfl:    Insulin  Pen Needle (PEN NEEDLES) 32G X 4 MM MISC, Use as directed, Disp: 100 each, Rfl: 2   JARDIANCE  25 MG TABS tablet, TAKE ONE TABLET BY MOUTH DAILY, Disp: 90 tablet, Rfl: 2   lamoTRIgine  (LAMICTAL ) 150 MG tablet, Take 1 tablet (150 mg total) by mouth 2 (two) times daily., Disp: 180 tablet, Rfl: 1   LORazepam  (ATIVAN ) 1 MG tablet, Take 1 tablet (1 mg total) by mouth every 8 (eight) hours as needed. for anxiety, Disp: 270 tablet, Rfl: 1   losartan  (COZAAR ) 25 MG tablet, Losartan  Potassium, Disp: , Rfl:    metFORMIN  (GLUCOPHAGE ) 1000 MG tablet, TAKE 1 TABLET BY MOUTH TWICE DAILY WITH MEALS, Disp: 180 tablet, Rfl: 1   metoprolol  tartrate (LOPRESSOR )  25 MG tablet, TAKE 1 TABLET BY MOUTH 2 TIMES DAILY., Disp: 180 tablet, Rfl: 3   Multiple Vitamin (MULTIVITAMIN WITH MINERALS) TABS tablet, Take 1 tablet by mouth daily., Disp: , Rfl:    omega-3 acid ethyl esters (LOVAZA ) 1 g capsule, Take 2 capsules (2 g total) by mouth 2 (two) times daily., Disp: 360 capsule, Rfl: 3   omeprazole  (PRILOSEC) 40 MG capsule, Take 1 capsule (40 mg total) by mouth daily., Disp: 90 capsule, Rfl: 0   Semaglutide  (RYBELSUS ) 14 MG TABS, Take 1 tablet (14 mg total) by mouth daily at 6 (six) AM., Disp: 90 tablet, Rfl: 0   trospium  (SANCTURA ) 20 MG tablet, Take 1 tablet (20 mg total) by mouth 2 (two) times daily., Disp: 180 tablet, Rfl: 3   valsartan  (DIOVAN ) 320 MG tablet, Take 1 tablet (320 mg total) by mouth daily., Disp: 90 tablet, Rfl: 3   No Known Allergies  Review of Systems  Constitutional: Negative.   Respiratory: Negative.    Cardiovascular: Negative.   Endocrine: Negative for polydipsia, polyphagia and polyuria.  Neurological:  Negative for dizziness and headaches.  Psychiatric/Behavioral: Negative.       Today's Vitals   03/19/24 1107  BP: 130/80  Pulse: 84  Temp: 98.5 F (36.9 C)  TempSrc: Oral  Weight: 227 lb (103 kg)  Height: 5' 9 (1.753 m)  PainSc: 0-No pain   Body mass index is 33.52 kg/m.  Wt Readings from Last 3 Encounters:  03/19/24 227 lb (103 kg)  11/16/23 229 lb 6.4 oz (104.1 kg)  07/05/23 228 lb (103.4 kg)    Objective:  Physical Exam Vitals and nursing note reviewed.  Constitutional:      General: He is not in acute distress.    Appearance: Normal appearance. He is obese.  Eyes:     Extraocular Movements: Extraocular movements intact.     Conjunctiva/sclera: Conjunctivae normal.     Pupils: Pupils are equal, round, and reactive to light.  Cardiovascular:     Rate and Rhythm: Normal rate and regular rhythm.     Pulses: Normal pulses.     Heart sounds: Normal heart sounds. No murmur heard. Pulmonary:     Effort:  Pulmonary effort is normal. No respiratory distress.     Breath sounds: Normal breath sounds. No wheezing.  Skin:    General: Skin is warm and dry.     Capillary Refill: Capillary refill takes less than 2 seconds.  Neurological:     General: No focal deficit present.     Mental Status: He is alert and oriented to person, place, and time.     Cranial Nerves: No cranial nerve deficit.     Motor: No weakness.  Psychiatric:        Mood and Affect: Mood normal.        Behavior: Behavior normal.        Thought Content: Thought content normal.        Judgment: Judgment normal.      Assessment And Plan:   Assessment & Plan Type 2 diabetes mellitus with stage 3a chronic kidney disease, with long-term current use of insulin  (HCC) Blood glucose levels fluctuate between 106 and 220 mg/dL. A1c at 6.8% indicates improved control. Stage 3a chronic kidney disease is stable but requires monitoring. Current medications are effective. - Ordered blood work for kidney function and A1c. - Continue insulin  degludec, empagliflozin , and semaglutide . - Advised dietary modifications to keep blood glucose below 150 mg/dL. - Encouraged regular exercise for weight management and glucose control. Mixed hyperlipidemia Cholesterol levels are stable. Continue current medications.  Class 1 obesity due to excess calories with serious comorbidity and body mass index (BMI) of 33.0 to 33.9 in adult He is encouraged to strive for BMI less than 30 to decrease cardiac risk. Advised to aim for at least 150 minutes of exercise per week.  Bipolar 1 disorder (HCC) Continue f/u with behavioral health Hypertensive heart disease with chronic diastolic congestive heart failure (HCC) Blood pressure is well controlled, continue current medications.   Orders Placed This Encounter  Procedures   BMP8+eGFR   Hemoglobin A1c   Lipid panel     Return for controlled DM check 4 months.  Patient was given opportunity to ask  questions. Patient verbalized understanding of the plan and was able to repeat key elements of the plan. All questions were answered to their satisfaction.    I, Robert [name],  FNP, have reviewed all documentation for this visit. The documentation on 03/19/24 for the exam, diagnosis, procedures, and orders are all accurate and complete.   IF YOU HAVE BEEN REFERRED TO A SPECIALIST, IT MAY TAKE 1-2 WEEKS TO SCHEDULE/PROCESS THE REFERRAL. IF YOU HAVE NOT HEARD FROM US /SPECIALIST IN TWO WEEKS, PLEASE GIVE US  A CALL AT [phone].

## 2024-03-19 NOTE — Patient Instructions (Signed)
  VISIT SUMMARY: During your follow-up appointment, we discussed your fluctuating blood sugar levels, kidney function, and general health maintenance. Your blood sugar levels range from 106 mg/dL to 779 mg/dL, and you feel best when it is around 140 mg/dL. You are managing your diabetes with insulin , Rybelsus , and Jardiance . We also reviewed your kidney function, which is currently stable at stage 3A. You received your flu shot two weeks ago and your shingles vaccine two to three years ago. You are overdue for an eye exam, as your last visit was in October of last year. Your hemoglobin level was 14.4 g/dL at your last check, and you are curious about your red and white blood cell counts. You engage in some physical activity, primarily walking, but acknowledge the need to increase your exercise.  YOUR PLAN: -TYPE 2 DIABETES MELLITUS WITH STAGE 3A CHRONIC KIDNEY DISEASE: Type 2 diabetes is a condition where your body does not use insulin  properly, leading to high blood sugar levels. Stage 3a chronic kidney disease means your kidneys are moderately damaged. Your blood glucose levels fluctuate between 106 and 220 mg/dL, and your J8r is at 3.1%, indicating improved control. Your kidney disease is stable but requires monitoring. We have ordered blood work to check your kidney function and A1c. Continue taking your current medications: insulin  degludec, Jardiance , and Rybelsus . The phamacist has sent a patient assistance for your Kerendia . Make dietary modifications to keep your blood glucose below 150 mg/dL and engage in regular exercise for weight management and glucose control.  -GENERAL HEALTH MAINTENANCE: You received your flu shot two weeks ago and your shingles vaccine is up to date. However, you are overdue for an eye exam, as your last visit was in October of last year. Ensure you schedule an eye examination before the end of the year.  INSTRUCTIONS: Please follow up with the ordered blood work for kidney  function and A1c. Schedule an eye examination before the end of the year. Continue taking your medications as prescribed and make the recommended dietary and exercise modifications.                      Contains text generated by Abridge.                                 Contains text generated by Abridge.

## 2024-03-20 NOTE — Telephone Encounter (Signed)
 PAP: Patient assistance application for Tresiba  has been approved by PAP Companies: [employer] from 05/03/2024 to 05/02/2025. Medication should be delivered to PAP Delivery: Provider's office. For further shipping updates, please contact [employer] at [phone]. Patient ID is: approval letter in media. Patient aware via telephone

## 2024-03-25 ENCOUNTER — Ambulatory Visit: Payer: Self-pay | Admitting: Nurse Practitioner

## 2024-03-25 NOTE — Assessment & Plan Note (Addendum)
 He is encouraged to strive for BMI less than 30 to decrease cardiac risk. Advised to aim for at least 150 minutes of exercise per week.

## 2024-03-25 NOTE — Assessment & Plan Note (Signed)
 Blood glucose levels fluctuate between 106 and 220 mg/dL. A1c at 6.8% indicates improved control. Stage 3a chronic kidney disease is stable but requires monitoring. Current medications are effective. - Ordered blood work for kidney function and A1c. - Continue insulin  degludec, empagliflozin , and semaglutide . - Advised dietary modifications to keep blood glucose below 150 mg/dL. - Encouraged regular exercise for weight management and glucose control.

## 2024-03-25 NOTE — Assessment & Plan Note (Addendum)
Continue f/u with behavioral health.

## 2024-03-25 NOTE — Assessment & Plan Note (Addendum)
 Cholesterol levels are stable. Continue current medications

## 2024-03-25 NOTE — Assessment & Plan Note (Addendum)
 Blood pressure is well controlled, continue current medications.

## 2024-04-02 ENCOUNTER — Telehealth: Payer: Self-pay

## 2024-04-02 NOTE — Telephone Encounter (Signed)
 Patient called and advised what his triglyceride level is. He says he hasn't been taking his medicine like he should, but will start back taking it. He asked what can he do to get it down, advised medication, decrease fats/salt in diet, aerobic type of exercise such as a brisk walk 30 min most days of the week. He verbalized understanding.   Copied from CRM [id number]. Topic: Clinical - Lab/Test Results >> Apr 02, 2024  2:43 PM Tiffany B wrote: Reason for CRM:  Patient was advised of most recent lab results but has additional questions. Patient would like to know his triglycerides levels. >> Apr 02, 2024  2:51 PM Tiffany B wrote: Attempted to reach NT, please call patient back today.

## 2024-04-02 NOTE — Telephone Encounter (Signed)
 Patient advised kidney function numbers on recent labs, he verbalized understanding. He says he's going to do better with decreasing the amount of cookies and sweets, advised A1C 7.0. He asked about stage 3b and 3a kidney failure and what it means, which is worse. He says the doctor explained it to him. Advised what the provider stated is what he has. He verbalized understanding. He says he hasn't been taking the cholesterol medication like he supposed to, but he will start back taking it.    Copied from CRM #[id number]. Topic: Clinical - Lab/Test Results >> Apr 02, 2024  2:03 PM Robert Walters wrote: Reason for CRM: Patient is calling to report that he is not going to the kidney specialist. He is taking pills for kidney- as they are 3a. Patient is wanting to know what his kidney function is? Patient is reporting that he is eating more cookies and sweets.

## 2024-04-20 NOTE — Telephone Encounter (Signed)
 PAP: Application for Bernadine has been submitted to [employer]), via fax

## 2024-04-24 ENCOUNTER — Other Ambulatory Visit (HOSPITAL_BASED_OUTPATIENT_CLINIC_OR_DEPARTMENT_OTHER): Payer: Self-pay | Admitting: Internal Medicine

## 2024-04-30 NOTE — Telephone Encounter (Signed)
 PAP: Patient assistance application for Jardiance  has been approved by PAP Companies: [employer] from 05/03/2024 to 05/02/2025. Medication should be delivered to PAP Delivery: Home. For further shipping updates, please contact [employer] ([employer]) at [phone]. Patient ID is: [id number] approval letter in media.

## 2024-04-30 NOTE — Telephone Encounter (Signed)
 PAP: Patient assistance application for Kerendia  has been approved by PAP Companies: [employer] from 05/03/2024 to 05/02/2025. Medication should be delivered to PAP Delivery: Home. For further shipping updates, please contact [employer] [phone]. Patient ID is: not provided.

## 2024-05-23 ENCOUNTER — Ambulatory Visit: Admitting: Physician Assistant

## 2024-05-23 ENCOUNTER — Encounter: Payer: Self-pay | Admitting: Physician Assistant

## 2024-05-23 DIAGNOSIS — F3341 Major depressive disorder, recurrent, in partial remission: Secondary | ICD-10-CM

## 2024-05-23 DIAGNOSIS — F411 Generalized anxiety disorder: Secondary | ICD-10-CM | POA: Diagnosis not present

## 2024-05-23 MED ORDER — LORAZEPAM 1 MG PO TABS: 1.0000 mg | ORAL_TABLET | Freq: Three times a day (TID) | ORAL | 1 refills | Status: AC | PRN

## 2024-05-23 MED ORDER — LAMOTRIGINE 150 MG PO TABS: 150.0000 mg | ORAL_TABLET | Freq: Two times a day (BID) | ORAL | 1 refills | Status: AC

## 2024-05-23 MED ORDER — FLUOXETINE HCL 40 MG PO CAPS: 40.0000 mg | ORAL_CAPSULE | Freq: Every morning | ORAL | 1 refills | Status: AC

## 2024-05-23 NOTE — Progress Notes (Signed)
 "     [facility] Check  Patient ID: Robert Walters,  MRN: [id number]  PCP: Georgina Speaks, FNP  Date of Evaluation: 05/23/2024 Time spent:25 minutes  Chief Complaint:  Chief Complaint   Anxiety; Depression; Follow-up    Virtual Visit via Telehealth  I connected with patient by telephone, with their informed consent, and verified patient privacy and that I am speaking with the correct person using two identifiers.  I am private, in my office and the patient is at home.  I discussed the limitations, risks, security and privacy concerns of performing an evaluation and management service by telephone and the availability of in person appointments. I also discussed with the patient that there may be a patient responsible charge related to this service. The patient expressed understanding and agreed to proceed.   I discussed the assessment and treatment plan with the patient. The patient was provided an opportunity to ask questions and all were answered. The patient agreed with the plan and demonstrated an understanding of the instructions.   The patient was advised to call back or seek an in-person evaluation if the symptoms worsen or if the condition fails to improve as anticipated.  I provided approximately 25 minutes of non-face-to-face time during this encounter.  HISTORY/CURRENT STATUS: HPI  For routine med check.  He's doing well on current meds.  He's been on the BZ for years and has had no SE.  Anxiety is controlled.  Energy and motivation are good for him.  No extreme sadness, tearfulness, or feelings of hopelessness.  Sleeps well most of the time. ADLs and personal hygiene are normal.   No change in memory.  Appetite has not changed. No mania, delirium, AH/VH.  No SI/HI.  Individual Medical History/ Review of Systems: Changes? :No     Past medications for mental health diagnoses include: Ambien , Ativan , Epitol , Lamictal , Prozac , Xanax , Wellbutrin  Allergies: Patient has no  known allergies.  Current Medications:  Current Outpatient Medications:    aspirin  EC 81 MG tablet, Take 1 tablet (81 mg total) by mouth daily., Disp: , Rfl:    atorvastatin  (LIPITOR ) 80 MG tablet, Take 1 tablet (80 mg total) by mouth daily., Disp: 90 tablet, Rfl: 2   ezetimibe  (ZETIA ) 10 MG tablet, TAKE ONE TABLET BY MOUTH ONCE DAILY., Disp: 30 tablet, Rfl: 0   Finerenone  (KERENDIA ) 10 MG TABS, Take 1 tablet (10 mg total) by mouth daily. Gets through Patient assistance (BAYER), Disp: 90 tablet, Rfl: 3   glucose blood (ONETOUCH ULTRA) test strip, USE 1 STRIP TO CHECK GLUCOSE 3 TIMES DAILY, Disp: 300 strip, Rfl: 3   hydrocortisone  cream 1 %, Apply to affected area 2 times daily, Disp: , Rfl:    insulin  degludec (TRESIBA  FLEXTOUCH) 200 UNIT/ML FlexTouch Pen, Take 55 units SQ daily - Patient Assistance, Disp: , Rfl:    Insulin  Pen Needle (PEN NEEDLES) 32G X 4 MM MISC, Use as directed, Disp: 100 each, Rfl: 2   JARDIANCE  25 MG TABS tablet, TAKE ONE TABLET BY MOUTH DAILY, Disp: 90 tablet, Rfl: 2   losartan  (COZAAR ) 25 MG tablet, Losartan  Potassium, Disp: , Rfl:    metFORMIN  (GLUCOPHAGE ) 1000 MG tablet, TAKE 1 TABLET BY MOUTH TWICE DAILY WITH MEALS, Disp: 180 tablet, Rfl: 1   metoprolol  tartrate (LOPRESSOR ) 25 MG tablet, TAKE 1 TABLET BY MOUTH 2 TIMES DAILY., Disp: 180 tablet, Rfl: 3   Multiple Vitamin (MULTIVITAMIN WITH MINERALS) TABS tablet, Take 1 tablet by mouth daily., Disp: , Rfl:  omega-3 acid ethyl esters (LOVAZA ) 1 g capsule, Take 2 capsules (2 g total) by mouth 2 (two) times daily., Disp: 360 capsule, Rfl: 3   omeprazole  (PRILOSEC) 40 MG capsule, Take 1 capsule (40 mg total) by mouth daily., Disp: 90 capsule, Rfl: 0   Semaglutide  (RYBELSUS ) 14 MG TABS, Take 1 tablet (14 mg total) by mouth daily at 6 (six) AM., Disp: 90 tablet, Rfl: 0   trospium  (SANCTURA ) 20 MG tablet, Take 1 tablet (20 mg total) by mouth 2 (two) times daily., Disp: 180 tablet, Rfl: 3   valsartan  (DIOVAN ) 320 MG tablet,  Take 1 tablet (320 mg total) by mouth daily., Disp: 90 tablet, Rfl: 3   FLUoxetine  (PROZAC ) 40 MG capsule, Take 1 capsule (40 mg total) by mouth every morning., Disp: 90 capsule, Rfl: 1   lamoTRIgine  (LAMICTAL ) 150 MG tablet, Take 1 tablet (150 mg total) by mouth 2 (two) times daily., Disp: 180 tablet, Rfl: 1   LORazepam  (ATIVAN ) 1 MG tablet, Take 1 tablet (1 mg total) by mouth every 8 (eight) hours as needed. for anxiety, Disp: 270 tablet, Rfl: 1 Medication Side Effects: none  Family Medical/ Social History: Changes?  No  MENTAL HEALTH EXAM:  There were no vitals taken for this visit.There is no height or weight on file to calculate BMI.  General Appearance: Unable to assess  Eye Contact:  Unable to assess  Speech:  Clear and Coherent, Normal Rate, and Talkative  Volume:  Normal  Mood:  Euthymic  Affect:  Unable to assess  Thought Process:  Goal Directed and Descriptions of Associations: Circumstantial  Orientation:  Full (Time, Place, and Person)  Thought Content: Logical   Suicidal Thoughts:  No  Homicidal Thoughts:  No  Memory:  WNL  Judgement:  Good  Insight:  Good  Psychomotor Activity:  Unable to assess  Concentration:  Concentration: Good and Attention Span: Good  Recall:  Good  Fund of Knowledge: Good  Language: Good  Assets:  Communication Skills Desire for Improvement Financial Resources/Insurance Housing Resilience Transportation  ADL's:  Intact  Cognition: WNL  Prognosis:  Good   DIAGNOSES:    ICD-10-CM   1. Recurrent major depressive disorder, in partial remission  F33.41     2. Generalized anxiety disorder  F41.1      Receiving Psychotherapy: No   RECOMMENDATIONS:  PDMP was reviewed. Ativan  last filled 01/04/2024. I provided approximately 25 minutes of non-face-to-face time during this encounter, including time spent before and after the visit in records review, medical decision making, counseling pertinent to today's visit, and charting.   He has  been on the benzo for many years without complications so we will make no changes.  Continue Prozac  40 mg every morning. Continue Lamictal  150 mg 1 p.o. twice daily. Continue Ativan  1 mg, 1 p.o. 3 times daily as needed. Return in 6 months.  Verneita Cooks, PA-C  "

## 2024-05-30 ENCOUNTER — Other Ambulatory Visit: Payer: Self-pay | Admitting: Internal Medicine

## 2024-06-01 ENCOUNTER — Other Ambulatory Visit: Payer: Self-pay | Admitting: Internal Medicine

## 2024-06-05 ENCOUNTER — Ambulatory Visit: Admitting: Student

## 2024-06-05 ENCOUNTER — Other Ambulatory Visit: Payer: Self-pay | Admitting: Internal Medicine

## 2024-06-05 MED ORDER — VALSARTAN 320 MG PO TABS: 320.0000 mg | ORAL_TABLET | Freq: Every day | ORAL | 0 refills | Status: AC

## 2024-06-06 ENCOUNTER — Other Ambulatory Visit: Payer: Self-pay

## 2024-06-06 ENCOUNTER — Telehealth: Payer: Self-pay

## 2024-06-06 DIAGNOSIS — E1122 Type 2 diabetes mellitus with diabetic chronic kidney disease: Secondary | ICD-10-CM

## 2024-06-06 MED ORDER — METFORMIN HCL 1000 MG PO TABS: ORAL_TABLET | ORAL | 1 refills | Status: AC

## 2024-06-06 NOTE — Telephone Encounter (Signed)
 Called patient to inform their  tresiba  and pen needles patient assistance  is ready for pick up, patient aware.  Patient made aware they have 1 week to pick up patient assistance.  PATIENT STATES DUE TO SNOW IT MAY BE PAST A WEEK.

## 2024-06-13 ENCOUNTER — Ambulatory Visit: Admitting: Student

## 2024-07-17 ENCOUNTER — Ambulatory Visit: Admitting: Nurse Practitioner

## 2024-08-13 ENCOUNTER — Ambulatory Visit: Admitting: Urology

## 2024-10-31 ENCOUNTER — Ambulatory Visit: Payer: Self-pay

## 2024-11-19 ENCOUNTER — Encounter: Payer: Self-pay | Admitting: Nurse Practitioner
